# Patient Record
Sex: Male | Born: 1950
Health system: Southern US, Community
[De-identification: ages and names within clinical notes are randomized; demographics above are authoritative.]

## PROBLEM LIST (undated history)

## (undated) DIAGNOSIS — G8929 Other chronic pain: Secondary | ICD-10-CM

## (undated) DIAGNOSIS — R51 Headache: Secondary | ICD-10-CM

## (undated) DIAGNOSIS — T7840XA Allergy, unspecified, initial encounter: Secondary | ICD-10-CM

## (undated) DIAGNOSIS — M109 Gout, unspecified: Secondary | ICD-10-CM

## (undated) DIAGNOSIS — K219 Gastro-esophageal reflux disease without esophagitis: Secondary | ICD-10-CM

## (undated) DIAGNOSIS — I2510 Atherosclerotic heart disease of native coronary artery without angina pectoris: Secondary | ICD-10-CM

## (undated) DIAGNOSIS — E119 Type 2 diabetes mellitus without complications: Secondary | ICD-10-CM

## (undated) DIAGNOSIS — R519 Headache, unspecified: Secondary | ICD-10-CM

## (undated) DIAGNOSIS — T753XXA Motion sickness, initial encounter: Secondary | ICD-10-CM

## (undated) DIAGNOSIS — I10 Essential (primary) hypertension: Secondary | ICD-10-CM

## (undated) DIAGNOSIS — H269 Unspecified cataract: Secondary | ICD-10-CM

## (undated) DIAGNOSIS — N183 Chronic kidney disease, stage 3 unspecified: Secondary | ICD-10-CM

## (undated) DIAGNOSIS — Z9289 Personal history of other medical treatment: Secondary | ICD-10-CM

## (undated) DIAGNOSIS — E785 Hyperlipidemia, unspecified: Secondary | ICD-10-CM

## (undated) DIAGNOSIS — K08109 Complete loss of teeth, unspecified cause, unspecified class: Secondary | ICD-10-CM

## (undated) DIAGNOSIS — R12 Heartburn: Secondary | ICD-10-CM

## (undated) HISTORY — DX: Atherosclerotic heart disease of native coronary artery without angina pectoris: I25.10

## (undated) HISTORY — DX: Other chronic pain: G89.29

## (undated) HISTORY — PX: TONSILLECTOMY: SUR1361

## (undated) HISTORY — DX: Allergy, unspecified, initial encounter: T78.40XA

## (undated) HISTORY — PX: EYE SURGERY: SHX253

## (undated) HISTORY — DX: Hyperlipidemia, unspecified: E78.5

## (undated) HISTORY — DX: Headache: R51

## (undated) HISTORY — DX: Heartburn: R12

## (undated) HISTORY — DX: Unspecified cataract: H26.9

## (undated) HISTORY — DX: Headache, unspecified: R51.9

## (undated) HISTORY — PX: KNEE SURGERY: SHX244

## (undated) HISTORY — PX: SPINE SURGERY: SHX786

## (undated) HISTORY — DX: Personal history of other medical treatment: Z92.89

## (undated) HISTORY — PX: SHOULDER SURGERY: SHX246

## (undated) HISTORY — DX: Gastro-esophageal reflux disease without esophagitis: K21.9

---
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
## 2009-03-06 ENCOUNTER — Ambulatory Visit: Payer: Self-pay | Admitting: Anesthesiology

## 2009-04-17 ENCOUNTER — Ambulatory Visit: Payer: Self-pay | Admitting: Anesthesiology

## 2011-10-21 ENCOUNTER — Emergency Department: Payer: Self-pay | Admitting: Emergency Medicine

## 2011-10-21 LAB — CBC
HCT: 47.4 % (ref 40.0–52.0)
HGB: 16.2 g/dL (ref 13.0–18.0)
MCH: 30.7 pg (ref 26.0–34.0)
MCHC: 34.2 g/dL (ref 32.0–36.0)
MCV: 90 fL (ref 80–100)
Platelet: 230 10*3/uL (ref 150–440)
RBC: 5.29 10*6/uL (ref 4.40–5.90)
RDW: 13.9 % (ref 11.5–14.5)
WBC: 6.8 10*3/uL (ref 3.8–10.6)

## 2011-10-21 LAB — BASIC METABOLIC PANEL
Anion Gap: 6 — ABNORMAL LOW (ref 7–16)
BUN: 22 mg/dL — ABNORMAL HIGH (ref 7–18)
Calcium, Total: 9.5 mg/dL (ref 8.5–10.1)
Chloride: 104 mmol/L (ref 98–107)
Co2: 25 mmol/L (ref 21–32)
Creatinine: 1.19 mg/dL (ref 0.60–1.30)
EGFR (African American): >60
EGFR (Non-African Amer.): >60
Glucose: 133 mg/dL — ABNORMAL HIGH (ref 65–99)
Osmolality: 275 (ref 275–301)
Potassium: 5 mmol/L (ref 3.5–5.1)
Sodium: 135 mmol/L — ABNORMAL LOW (ref 136–145)

## 2011-10-21 LAB — CK TOTAL AND CKMB (NOT AT ARMC)
CK, Total: 118 U/L (ref 35–232)
CK-MB: 1 ng/mL (ref 0.5–3.6)

## 2011-10-21 LAB — TROPONIN I: Troponin-I: <0.02 ng/mL

## 2011-10-25 ENCOUNTER — Ambulatory Visit: Payer: Self-pay | Admitting: Family Medicine

## 2012-07-05 ENCOUNTER — Encounter: Payer: Self-pay | Admitting: Specialist

## 2012-07-26 ENCOUNTER — Encounter: Payer: Self-pay | Admitting: Specialist

## 2014-05-09 DIAGNOSIS — M75122 Complete rotator cuff tear or rupture of left shoulder, not specified as traumatic: Secondary | ICD-10-CM | POA: Insufficient documentation

## 2014-05-09 DIAGNOSIS — M65819 Other synovitis and tenosynovitis, unspecified shoulder: Secondary | ICD-10-CM | POA: Insufficient documentation

## 2014-05-09 DIAGNOSIS — M65811 Other synovitis and tenosynovitis, right shoulder: Secondary | ICD-10-CM | POA: Insufficient documentation

## 2014-05-22 ENCOUNTER — Ambulatory Visit: Payer: Self-pay | Admitting: Surgery

## 2014-05-26 ENCOUNTER — Ambulatory Visit: Payer: Self-pay | Admitting: Surgery

## 2014-05-27 ENCOUNTER — Ambulatory Visit: Payer: Self-pay | Admitting: Surgery

## 2014-05-29 DIAGNOSIS — S43439A Superior glenoid labrum lesion of unspecified shoulder, initial encounter: Secondary | ICD-10-CM | POA: Insufficient documentation

## 2014-05-29 DIAGNOSIS — S46909A Unspecified injury of unspecified muscle, fascia and tendon at shoulder and upper arm level, unspecified arm, initial encounter: Secondary | ICD-10-CM | POA: Insufficient documentation

## 2014-07-27 NOTE — Op Note (Signed)
PATIENT NAME:  [name], [name] MR#:  [id number] DATE OF BIRTH:  06-28-1950  DATE OF PROCEDURE:  05/27/2014  PREOPERATIVE DIAGNOSIS: Massive rotator cuff tear, right shoulder.  POSTOPERATIVE DIAGNOSES:  Massive rotator cuff tear with SLAP tear, biceps tendinopathy, and early degenerative joint disease, right shoulder.   PROCEDURES: Arthroscopic SLAP repair, arthroscopic debridement, arthroscopic subacromial decompression, mini-open repair of massive rotator cuff tear, and mini-open biceps tenodesis, right shoulder.   SURGEON:  Maryagnes AmosJ. Jeffrey Poggi, M.D.   ASSISTANT: Devota PaceApril Berndt, NP.   ANESTHESIA: General endotracheal with an interscalene block placed preoperative by the anesthesiologist.   FINDINGS: As noted above. There were grade 2 chondromalacia changes involving the glenoid. The superior labrum was torn from approximately the 10:30 position to the 12:30 position.  There were extensive tendinopathic changes of the biceps tendon. The superior 60%-70% of the subscapularis tendon had torn. In addition, there were complete tears of the supraspinatus and infraspinatus tendons.   COMPLICATIONS: None.   ESTIMATED BLOOD LOSS: < 25 mL.  TOTAL FLUIDS: 1000 mL of crystalloid.  TOURNIQUET: None.   DRAINS: None.   CLOSURE: Staples.   BRIEF CLINICAL NOTE: The patient is a 64 year old male who sustained an injury to his right shoulder when he was trying to pull on a rope and it gave way, causing him to fall backwards onto his shoulder approximately 2 months ago. His symptoms have persisted despite medications, activity modification, et [name]. His history and examination were consistent with a large rotator cuff tear, confirmed by MRI scan. He presents at this time for arthroscopy, debridement, decompression, and repair of the rotator cuff tear.   DESCRIPTION OF PROCEDURE: The patient was brought into the operating room and lain in the supine position. After adequate IV sedation was achieved, an  interscalene block was placed by the anesthesiologist. He then underwent general endotracheal intubation and anesthesia before being repositioned in the beach chair position using the beach chair positioner. The right shoulder and upper extremity were prepped with ChloraPrep solution before being draped sterilely. Preoperative antibiotics were administered. The expected portal sites and incision site were injected with 1% lidocaine with epinephrine before the camera was placed in the posterior portal. The glenohumeral joint was thoroughly inspected with the findings as described above. An anterior portal was created using an outside in technique. The labrum was debrided back to stable margins anteriorly and superiorly. In addition, the torn margins of the rotator cuff also were debrided back to stable margins using the full radius resector. Finally, the areas of synovitis and grade II chondromalacia were debrided back to stable margins. The probe was used to define the tear. The biceps tendon was released from its labral attachment using the ArthroCare wand. The exposed glenoid rim superiorly was roughened with an end-cutting rasp and full radius resector to expose good bleeding bone. The superior labrum was then secured using a single BioKnotless anchor placed at approximately the 12 o'clock position through a separate superolateral portal which was created using an outside in technique. Subsequent probing of the repair demonstrated good stability. The instruments were removed from the joint after suctioning the excess fluid.   The camera was repositioned through the posterior portal into the subacromial space. A separate lateral portal was created using an outside in technique. The full radius resector was used to debride the bursal tissues to improve visualization. The ArthroCare wand was used to remove the periosteal tissue off the undersurface of the anterior third of the acromion as well as to recess the  coracoacromial ligament from its attachment along the anterior and lateral margins of the acromion. The 4 mm acromionizing bur was used to complete the decompression by removing the undersurface of the anterior third of the acromion. The full radius resector was reintroduced to remove any residual bony debris before the ArthroCare wand was used to obtain hemostasis. The instruments were then removed from the subacromial space after suctioning the excess fluid.   An approximately 6-7 cm incision was made over the anterolateral aspect of the shoulder, incorporating the superolateral portal site. The incision was carried down through the subcutaneous tissues to expose the deltoid fascia. The raphe between the anterior and middle thirds was identified and this plane developed to provide access into the subacromial space. Additional bursal tissues were debrided sharply using Metzenbaum scissors to improve visualization. The torn portion of the supraspinatus and infraspinatus tendons was identified. It was a large L-shaped tear with the longitudinal arm anterior through the rotator interval. It measured approximately 3 cm longitudinally by 4 cm laterally. The frayed margins were debrided sharply with a #15 blade and the exposed greater tuberosity roughened with a rongeur.   Once this area was prepared, attention was directed to the subscapularis tendon. A short vertical incision was made through the bursal tissues over the anterior aspect of the subscapularis tendon at the margin of the tear. The #15 blade was used to debride the subscapularis tendon margin before a rongeur was used to debride the exposed lesser tuberosity. Two of the Biomet 2.9 mm JuggerKnot anchors were placed in the lesser tuberosity. Both sets of the more inferior suture were passed through the subscapularis tendon while one of the two sets of sutures from the more superior anchor also were passed through the tendon. These sutures were tied  securely then brought out laterally through bone tunnels into the bicipital groove to further reinforce the repair by effectively creating a two layer closure. The other suture of the more proximal anchor was reserved to be used to tie down the anterior portion of the supraspinatus later.   Once the subscapularis tendon was repaired, the biceps tendon was tenodesed into the inferior portion of the bicipital groove using a third Biomet 2.9 mm JuggerKnot anchor. Both sets of sutures were passed through the tendon. The tendon was tied securely to effect the tenodesis. The excess tendon was removed using a #15 blade.   Attention was redirected to the supraspinatus and infraspinatus tendons. Three of the Biomet 2.9 mm JuggerKnot anchors were placed along the articular margin. Each of the 6 sets of sutures were passed through the torn margin of the tendon and then tied securely to repair tendons. Several of these sutures were then brought out laterally through bone tunnels to further reinforce the repair by creating a two layer closure. The free set of sutures from the more proximal of the two anchors already placed in the subscapularis tendon was passed through the anterior margin of the supraspinatus tendon and tied securely. This also was brought back through the soft tissue laterally to further reinforce this repair. Several #0 Ethibond sutures were placed in a side-to-side fashion to close the rotator interval. An apparent watertight closure was obtained. The repair did not appear to be under any undue tension.   The wound was copiously irrigated with sterile saline solution before the deltoid fascia was reapproximated using 2-0 Vicryl interrupted sutures. The subcutaneous tissues were closed in two layers using 2-0 Vicryl interrupted sutures before the skin was closed using staples.  The portal sites also were closed using staples before a sterile bulky dressing was applied to the shoulder. The arm was placed  into a shoulder immobilizer before the patient was awakened, extubated, and returned to the recovery room in satisfactory condition after tolerating the procedure well.    ____________________________ J. Derald Macleod, MD jjp:mc D: 05/27/2014 11:15:49 ET T: 05/27/2014 11:33:29 ET JOB#: [id number]  cc: Maryagnes Amos, MD, <Dictator> Maryagnes Amos MD ELECTRONICALLY SIGNED 05/27/2014 14:16

## 2014-10-16 ENCOUNTER — Ambulatory Visit: Payer: Self-pay | Admitting: Family Medicine

## 2014-10-16 ENCOUNTER — Ambulatory Visit (INDEPENDENT_AMBULATORY_CARE_PROVIDER_SITE_OTHER): Payer: Commercial Indemnity | Admitting: Family Medicine

## 2014-10-16 ENCOUNTER — Encounter: Payer: Self-pay | Admitting: Family Medicine

## 2014-10-16 VITALS — BP 120/69 | HR 76 | Resp 16 | Ht 70.0 in | Wt 262.0 lb

## 2014-10-16 DIAGNOSIS — E785 Hyperlipidemia, unspecified: Secondary | ICD-10-CM | POA: Diagnosis not present

## 2014-10-16 DIAGNOSIS — I129 Hypertensive chronic kidney disease with stage 1 through stage 4 chronic kidney disease, or unspecified chronic kidney disease: Secondary | ICD-10-CM | POA: Insufficient documentation

## 2014-10-16 DIAGNOSIS — N183 Chronic kidney disease, stage 3 unspecified: Secondary | ICD-10-CM | POA: Insufficient documentation

## 2014-10-16 DIAGNOSIS — E1121 Type 2 diabetes mellitus with diabetic nephropathy: Secondary | ICD-10-CM | POA: Insufficient documentation

## 2014-10-16 DIAGNOSIS — M109 Gout, unspecified: Secondary | ICD-10-CM | POA: Insufficient documentation

## 2014-10-16 DIAGNOSIS — I10 Essential (primary) hypertension: Secondary | ICD-10-CM | POA: Insufficient documentation

## 2014-10-16 DIAGNOSIS — M1000 Idiopathic gout, unspecified site: Secondary | ICD-10-CM

## 2014-10-16 DIAGNOSIS — K219 Gastro-esophageal reflux disease without esophagitis: Secondary | ICD-10-CM | POA: Diagnosis not present

## 2014-10-16 DIAGNOSIS — E119 Type 2 diabetes mellitus without complications: Secondary | ICD-10-CM

## 2014-10-16 DIAGNOSIS — E1165 Type 2 diabetes mellitus with hyperglycemia: Secondary | ICD-10-CM

## 2014-10-16 DIAGNOSIS — Z794 Long term (current) use of insulin: Secondary | ICD-10-CM

## 2014-10-16 DIAGNOSIS — E1169 Type 2 diabetes mellitus with other specified complication: Secondary | ICD-10-CM | POA: Insufficient documentation

## 2014-10-16 LAB — POCT GLYCOSYLATED HEMOGLOBIN (HGB A1C): Hemoglobin A1C: 8.2

## 2014-10-16 MED ORDER — GLIMEPIRIDE 4 MG PO TABS: 4.0000 mg | ORAL_TABLET | Freq: Every day | ORAL | Status: DC

## 2014-10-16 MED ORDER — CANAGLIFLOZIN 300 MG PO TABS: 300.0000 mg | ORAL_TABLET | Freq: Every day | ORAL | Status: DC

## 2014-10-16 MED ORDER — ROSUVASTATIN CALCIUM 10 MG PO TABS: 10.0000 mg | ORAL_TABLET | Freq: Every day | ORAL | Status: DC

## 2014-10-16 NOTE — Progress Notes (Signed)
Name: [name]   MRN: [id number]    DOB: 30-Apr-1950   Date:10/16/2014       Progress Note  Subjective  Chief Complaint  Chief Complaint  Patient presents with  . Hypertension  . Diabetes    not checking BS    HPI  Here for f/u of DM, HBP, elevated chol.  Not checking BSs at home.  Reports muscle cramps with Simvastatin. Weight unchanged.  S/p shoulder surgery 4 mos. Ago.   Past Medical History  Diagnosis Date  . Diabetes mellitus without complication   . Heart burn   . Hyperlipidemia   . Allergy   . GERD (gastroesophageal reflux disease)   . Chronic headaches     Past Surgical History  Procedure Laterality Date  . Knee surgery    . Tonsillectomy      Family History  Problem Relation Age of Onset  . Cancer Father     lung  . Heart disease Sister   . Heart disease Brother     History   Social History  . Marital Status: Married    Spouse Name: N/A  . Number of Children: N/A  . Years of Education: N/A   Occupational History  . Not on file.   Social History Main Topics  . Smoking status: Current Every Day Smoker -- 1.00 packs/day for 39 years  . Smokeless tobacco: Current User    Types: Chew  . Alcohol Use: Yes  . Drug Use: No  . Sexual Activity: Yes    Birth Control/ Protection: Inserts   Other Topics Concern  . Not on file   Social History Narrative  . No narrative on file     Current outpatient prescriptions:  .  amLODipine (NORVASC) 2.5 MG tablet, , Disp: , Rfl:  .  colchicine 0.6 MG tablet, Take 0.6 mg by mouth daily., Disp: , Rfl:  .  fluticasone (FLONASE) 50 MCG/ACT nasal spray, Place into both nostrils daily., Disp: , Rfl:  .  glimepiride (AMARYL) 4 MG tablet, , Disp: , Rfl:  .  linagliptin (TRADJENTA) 5 MG TABS tablet, Take by mouth., Disp: , Rfl:  .  lisinopril (PRINIVIL,ZESTRIL) 40 MG tablet, , Disp: , Rfl:  .  metformin (FORTAMET) 1000 MG (OSM) 24 hr tablet, , Disp: , Rfl:  .  omeprazole (PRILOSEC) 20 MG capsule, , Disp: ,  Rfl:   Allergies  Allergen Reactions  . Prednisone Other (See Comments)     Review of Systems  Constitutional: Negative.  Negative for fever, chills, weight loss and malaise/fatigue.  HENT: Negative.  Negative for nosebleeds.   Eyes: Negative for blurred vision and double vision.  Respiratory: Negative for cough, sputum production, shortness of breath and wheezing.   Cardiovascular: Negative.  Negative for chest pain, palpitations, orthopnea and leg swelling.  Gastrointestinal: Negative for heartburn, abdominal pain and blood in stool.  Genitourinary: Negative for dysuria, urgency and frequency.  Skin: Negative.  Negative for rash.  Neurological: Negative for dizziness, tingling, sensory change, focal weakness, weakness and headaches.      Objective  Filed Vitals:   10/16/14 0852  BP: 120/69  Pulse: 76  Resp: 16  Height: 5\' 10"  (1.778 m)  Weight: 262 lb (118.842 kg)    Physical Exam  Constitutional: He is well-developed, well-nourished, and in no distress. No distress.  HENT:  Head: Normocephalic and atraumatic.  Eyes: Conjunctivae and EOM are normal. Pupils are equal, round, and reactive to light. No scleral icterus.  Neck: Normal  range of motion. Neck supple. No thyromegaly present.  Cardiovascular: Normal rate, regular rhythm, normal heart sounds and intact distal pulses.  Exam reveals no gallop and no friction rub.   No murmur heard. Pulmonary/Chest: Effort normal and breath sounds normal. No respiratory distress. He has no wheezes. He has no rales.  Abdominal: Soft. Bowel sounds are normal. He exhibits no distension and no mass. There is no tenderness.  Musculoskeletal: He exhibits no edema.  Lymphadenopathy:    He has no cervical adenopathy.  Vitals reviewed.       Assessment & Plan  Problem List Items Addressed This Visit      Cardiovascular and Mediastinum   Hypertension   Relevant Medications   amLODipine (NORVASC) 2.5 MG tablet   lisinopril  (PRINIVIL,ZESTRIL) 40 MG tablet     Digestive   GERD without esophagitis   Relevant Medications   omeprazole (PRILOSEC) 20 MG capsule     Endocrine   Diabetes - Primary   Relevant Medications   glimepiride (AMARYL) 4 MG tablet   lisinopril (PRINIVIL,ZESTRIL) 40 MG tablet   metformin (FORTAMET) 1000 MG (OSM) 24 hr tablet   linagliptin (TRADJENTA) 5 MG TABS tablet   Other Relevant Orders   POCT HgB A1C     Other   Dyslipidemia   Gout      Meds ordered this encounter  Medications  . DISCONTD: allopurinol (ZYLOPRIM) 100 MG tablet    Sig:   . amLODipine (NORVASC) 2.5 MG tablet    Sig:   . glimepiride (AMARYL) 4 MG tablet    Sig:   . DISCONTD: TRADJENTA 5 MG TABS tablet    Sig:   . lisinopril (PRINIVIL,ZESTRIL) 40 MG tablet    Sig:   . metformin (FORTAMET) 1000 MG (OSM) 24 hr tablet    Sig:   . omeprazole (PRILOSEC) 20 MG capsule    Sig:   . DISCONTD: oxyCODONE (OXY IR/ROXICODONE) 5 MG immediate release tablet    Sig: TK 1 T PO QD PRN P    Refill:  0  . DISCONTD: simvastatin (ZOCOR) 20 MG tablet    Sig: Take 20 mg by mouth daily.  . colchicine 0.6 MG tablet    Sig: Take 0.6 mg by mouth daily.  Marland Kitchen DISCONTD: cyclobenzaprine (FLEXERIL) 10 MG tablet    Sig: Take 10 mg by mouth 3 (three) times daily as needed for muscle spasms.  . fluticasone (FLONASE) 50 MCG/ACT nasal spray    Sig: Place into both nostrils daily.  Marland Kitchen linagliptin (TRADJENTA) 5 MG TABS tablet    Sig: Take by mouth.   1. Type 2 diabetes mellitus without complication  - POCT HgB A1C-8.2 - canagliflozin (INVOKANA) 300 MG TABS tablet; Take 300 mg by mouth daily before breakfast.  Dispense: 30 tablet; Refill: 6 - glimepiride (AMARYL) 4 MG tablet; Take 1 tablet (4 mg total) by mouth daily with breakfast.  Dispense: 30 tablet; Refill: 12  2. Essential hypertension   3. Dyslipidemia  - rosuvastatin (CRESTOR) 10 MG tablet; Take 1 tablet (10 mg total) by mouth daily.  Dispense: 90 tablet; Refill: 3  4.  Idiopathic gout, unspecified chronicity, unspecified site   5. GERD without esophagitis

## 2014-10-16 NOTE — Patient Instructions (Addendum)
Check sugars daily.  Bring log next visit.

## 2014-10-21 ENCOUNTER — Other Ambulatory Visit: Payer: Self-pay

## 2014-10-21 ENCOUNTER — Telehealth: Payer: Self-pay

## 2014-10-21 ENCOUNTER — Encounter: Payer: Self-pay | Admitting: Student

## 2014-10-21 ENCOUNTER — Emergency Department
Admission: EM | Admit: 2014-10-21 | Discharge: 2014-10-21 | Disposition: A | Discharge status: Home / Self Care | Payer: Commercial Indemnity | Attending: Emergency Medicine | Admitting: Emergency Medicine

## 2014-10-21 DIAGNOSIS — E119 Type 2 diabetes mellitus without complications: Secondary | ICD-10-CM | POA: Insufficient documentation

## 2014-10-21 DIAGNOSIS — R197 Diarrhea, unspecified: Secondary | ICD-10-CM | POA: Insufficient documentation

## 2014-10-21 DIAGNOSIS — Z87891 Personal history of nicotine dependence: Secondary | ICD-10-CM | POA: Diagnosis not present

## 2014-10-21 DIAGNOSIS — I10 Essential (primary) hypertension: Secondary | ICD-10-CM | POA: Diagnosis not present

## 2014-10-21 DIAGNOSIS — N289 Disorder of kidney and ureter, unspecified: Secondary | ICD-10-CM | POA: Diagnosis not present

## 2014-10-21 DIAGNOSIS — R531 Weakness: Secondary | ICD-10-CM | POA: Diagnosis present

## 2014-10-21 HISTORY — DX: Essential (primary) hypertension: I10

## 2014-10-21 LAB — BASIC METABOLIC PANEL
Anion gap: 12 (ref 5–15)
Anion gap: 9 (ref 5–15)
BUN: 26 mg/dL — ABNORMAL HIGH (ref 6–20)
BUN: 26 mg/dL — ABNORMAL HIGH (ref 6–20)
CO2: 22 mmol/L (ref 22–32)
CO2: 22 mmol/L (ref 22–32)
Calcium: 9.9 mg/dL (ref 8.9–10.3)
Calcium: 8.9 mg/dL (ref 8.9–10.3)
Chloride: 98 mmol/L — ABNORMAL LOW (ref 101–111)
Chloride: 102 mmol/L (ref 101–111)
Creatinine, Ser: 2.02 mg/dL — ABNORMAL HIGH (ref 0.61–1.24)
Creatinine, Ser: 1.89 mg/dL — ABNORMAL HIGH (ref 0.61–1.24)
GFR calc Af Amer: 39 mL/min — ABNORMAL LOW (ref >60)
GFR calc Af Amer: 42 mL/min — ABNORMAL LOW (ref >60)
GFR calc non Af Amer: 33 mL/min — ABNORMAL LOW (ref >60)
GFR calc non Af Amer: 36 mL/min — ABNORMAL LOW (ref >60)
Glucose, Bld: 180 mg/dL — ABNORMAL HIGH (ref 65–99)
Glucose, Bld: 122 mg/dL — ABNORMAL HIGH (ref 65–99)
Potassium: 5 mmol/L (ref 3.5–5.1)
Potassium: 4.8 mmol/L (ref 3.5–5.1)
Sodium: 132 mmol/L — ABNORMAL LOW (ref 135–145)
Sodium: 133 mmol/L — ABNORMAL LOW (ref 135–145)

## 2014-10-21 LAB — URINALYSIS COMPLETE WITH MICROSCOPIC (ARMC ONLY)
Bacteria, UA: NONE SEEN
Bilirubin Urine: NEGATIVE
Glucose, UA: >500 mg/dL — AB
Hgb urine dipstick: NEGATIVE
Ketones, ur: NEGATIVE mg/dL
Leukocytes, UA: NEGATIVE
Nitrite: NEGATIVE
Protein, ur: NEGATIVE mg/dL
Specific Gravity, Urine: 1.023 (ref 1.005–1.030)
pH: 5 (ref 5.0–8.0)

## 2014-10-21 LAB — CBC
HCT: 45.6 % (ref 40.0–52.0)
Hemoglobin: 14.6 g/dL (ref 13.0–18.0)
MCH: 28.3 pg (ref 26.0–34.0)
MCHC: 32.1 g/dL (ref 32.0–36.0)
MCV: 88.3 fL (ref 80.0–100.0)
Platelets: 338 10*3/uL (ref 150–440)
RBC: 5.16 MIL/uL (ref 4.40–5.90)
RDW: 14.5 % (ref 11.5–14.5)
WBC: 11.4 10*3/uL — ABNORMAL HIGH (ref 3.8–10.6)

## 2014-10-21 MED ORDER — SODIUM CHLORIDE 0.9 % IV SOLN
Freq: Once | INTRAVENOUS | Status: AC
  Administered 2014-10-21: 18:00:00 via INTRAVENOUS

## 2014-10-21 NOTE — Telephone Encounter (Signed)
Called and spoke with patient about his symptoms. He stopped crestor yesterday. Advised him this is okay. He reports feeling very nauseated and sugar is higher than baseline of 180.  Advised him that his new medication Invokana can cause too much acid in the blood and this could be a life threatening situation. Encouraged pt is symptoms of nausea, weakness, abdominal pain, increased thirst, fatigue, or malaise continued through the evening or worsened and sugars continued to rise, he needed to be seen at ER to rule out this side effect.

## 2014-10-21 NOTE — ED Provider Notes (Signed)
[facility] Emergency Department Provider Note     Time seen: ----------------------------------------- 5:00 PM on 10/21/2014 -----------------------------------------    I have reviewed the triage vital signs and the nursing notes.   HISTORY  Chief Complaint Weakness; Diarrhea; and Medication Reaction    HPI [name] is a 64 y.o. male who presents ER with weakness and fatigue, mild diarrhea and nausea. Patient recently started a new diabetes medication as well as a new cholesterol medication about a week ago. Reports elevated blood sugars and 200s, he states the fatigue was so significant he had to stop the medications. Denies fevers chills or other complaints.   Past Medical History  Diagnosis Date  . Diabetes mellitus without complication   . Heart burn   . Hyperlipidemia   . Allergy   . GERD (gastroesophageal reflux disease)   . Chronic headaches   . Hypertension     Patient Active Problem List   Diagnosis Date Noted  . Diabetes 10/16/2014  . Hypertension 10/16/2014  . Dyslipidemia 10/16/2014  . Gout 10/16/2014  . GERD without esophagitis 10/16/2014  . Injury of tendon of upper extremity 05/29/2014  . Glenoid labral tear 05/29/2014  . Complete rotator cuff rupture of left shoulder 05/09/2014  . Infraspinatus tenosynovitis 05/09/2014    Past Surgical History  Procedure Laterality Date  . Knee surgery    . Tonsillectomy    . Shoulder surgery      Allergies Prednisone  Social History History  Substance Use Topics  . Smoking status: Former Smoker -- 1.00 packs/day for 39 years  . Smokeless tobacco: Current User    Types: Chew  . Alcohol Use: Yes    Review of Systems Constitutional: Negative for fever. Eyes: Negative for visual changes. ENT: Negative for sore throat. Cardiovascular: Negative for chest pain. Respiratory: Negative for shortness of breath. Gastrointestinal: Negative for abdominal pain, positive for  mild diarrhea Genitourinary: Negative for dysuria. Musculoskeletal: Negative for back pain. Skin: Negative for rash. Neurological: Positive for weakness  10-point ROS otherwise negative.  ____________________________________________   PHYSICAL EXAM:  VITAL SIGNS: ED Triage Vitals  Enc Vitals Group     BP 10/21/14 1638 122/58 mmHg     Pulse Rate 10/21/14 1638 101     Resp 10/21/14 1638 18     Temp 10/21/14 1638 99.2 F (37.3 C)     Temp Source 10/21/14 1638 Oral     SpO2 10/21/14 1638 96 %     Weight 10/21/14 1638 261 lb (118.389 kg)     Height 10/21/14 1638  (1.803 m)     Head Cir --      Peak Flow --      Pain Score --      Pain Loc --      Pain Edu? --      Excl. in GC? --     Constitutional: Alert and oriented. Well appearing and in no distress. Eyes: Conjunctivae are normal. PERRL. Normal extraocular movements. ENT   Head: Normocephalic and atraumatic.   Nose: No congestion/rhinnorhea.   Mouth/Throat: Mucous membranes are moist.   Neck: No stridor. Cardiovascular: Normal rate, regular rhythm. Normal and symmetric distal pulses are present in all extremities. No murmurs, rubs, or gallops. Respiratory: Normal respiratory effort without tachypnea nor retractions. Breath sounds are clear and equal bilaterally. No wheezes/rales/rhonchi. Gastrointestinal: Soft and nontender. No distention. No abdominal bruits. Musculoskeletal: Nontender with normal range of motion in all extremities. No joint effusions.  No lower extremity  tenderness nor edema. Neurologic:  Normal speech and language. No gross focal neurologic deficits are appreciated. Speech is normal. No gait instability. Skin:  Skin is warm, dry and intact. No rash noted. Psychiatric: Mood and affect are normal. Speech and behavior are normal. Patient exhibits appropriate insight and judgment. ____________________________________________  EKG: Interpreted by me. Normal sinus rhythm with left axis  deviation, PR interval was normal, QRS with normal, QT intervals normal. Questionable anterior infarct. Rate is 97 bpm  ____________________________________________  ED COURSE:  Pertinent labs & imaging results that were available during my care of the patient were reviewed by me and considered in my medical decision making (see chart for details). We'll check basic labs and reevaluate. ____________________________________________    LABS (pertinent positives/negatives)  Labs Reviewed  BASIC METABOLIC PANEL - Abnormal; Notable for the following:    Sodium 132 (*)    Chloride 98 (*)    Glucose, Bld 180 (*)    BUN 26 (*)    Creatinine, Ser 2.02 (*)    GFR calc non Af Amer 33 (*)    GFR calc Af Amer 39 (*)    All other components within normal limits  CBC - Abnormal; Notable for the following:    WBC 11.4 (*)    All other components within normal limits  URINALYSIS COMPLETEWITH MICROSCOPIC ([facility] ONLY) - Abnormal; Notable for the following:    Color, Urine YELLOW (*)    APPearance CLEAR (*)    Glucose, UA >500 (*)    Squamous Epithelial / LPF 0-5 (*)    All other components within normal limits  BASIC METABOLIC PANEL - Abnormal; Notable for the following:    Sodium 133 (*)    Glucose, Bld 122 (*)    BUN 26 (*)    Creatinine, Ser 1.89 (*)    GFR calc non Af Amer 36 (*)    GFR calc Af Amer 42 (*)    All other components within normal limits    ____________________________________________  FINAL ASSESSMENT AND PLAN  Weakness, acute renal insufficiency  Plan: Patient with labs and imaging as dictated above. A lot of his symptoms are probably medication related. He was noted to have acute renal sufficiency with a creatinine over 2.0. We gave him a liter fluid rechecked his chemistries which showed some improvement in his renal function. He is advised to hold his medications as recently prescribed continue to increase by mouth intake and follow-up with his primary care  doctor.   Emily Filbert, MD   Emily Filbert, MD 10/21/14 [phone]

## 2014-10-21 NOTE — Discharge Instructions (Signed)
Kidney Failure Kidney failure happens when the kidneys cannot remove waste and excess fluid that naturally builds up in your blood after your body breaks down food. This leads to a dangerous buildup of waste products and fluid in the blood. HOME CARE  Follow your diet as told by your doctor.  Take all medicines as told by your doctor.  Keep all of your dialysis appointments. Call if you are unable to keep an appointment. GET HELP RIGHT AWAY IF:   You make a lot more or very little pee (urine).  Your face or ankles puff up (swell).  You develop shortness of breath.  You develop weakness, feel tired, or you do not feel hungry (appetite loss).  You feel poorly for no known reason. MAKE SURE YOU:   Understand these instructions.  Will watch your condition.  Will get help right away if you are not doing well or get worse. Document Released: 06/08/2009 Document Revised: 06/06/2011 Document Reviewed: 07/15/2009 [facility] Patient Information 2015 [place], [place]. This information is not intended to replace advice given to you by your health care provider. Make sure you discuss any questions you have with your health care provider. Weakness Weakness is a lack of strength. It may be felt all over the body (generalized) or in one specific part of the body (focal). Some causes of weakness can be serious. You may need further medical evaluation, especially if you are elderly or you have a history of immunosuppression (such as chemotherapy or HIV), kidney disease, heart disease, or diabetes. CAUSES  Weakness can be caused by many different things, including:  Infection.  Physical exhaustion.  Internal bleeding or other blood loss that results in a lack of red blood cells (anemia).  Dehydration. This cause is more common in elderly people.  Side effects or electrolyte abnormalities from medicines, such as pain medicines or sedatives.  Emotional distress, anxiety, or  depression.  Circulation problems, especially severe peripheral arterial disease.  Heart disease, such as rapid atrial fibrillation, bradycardia, or heart failure.  Nervous system disorders, such as Guillain-Barr syndrome, multiple sclerosis, or stroke. DIAGNOSIS  To find the cause of your weakness, your caregiver will take your history and perform a physical exam. Lab tests or X-rays may also be ordered, if needed. TREATMENT  Treatment of weakness depends on the cause of your symptoms and can vary greatly. HOME CARE INSTRUCTIONS   Rest as needed.  Eat a well-balanced diet.  Try to get some exercise every day.  Only take over-the-counter or prescription medicines as directed by your caregiver. SEEK MEDICAL CARE IF:   Your weakness seems to be getting worse or spreads to other parts of your body.  You develop new aches or pains. SEEK IMMEDIATE MEDICAL CARE IF:   You cannot perform your normal daily activities, such as getting dressed and feeding yourself.  You cannot walk up and down stairs, or you feel exhausted when you do so.  You have shortness of breath or chest pain.  You have difficulty moving parts of your body.  You have weakness in only one area of the body or on only one side of the body.  You have a fever.  You have trouble speaking or swallowing.  You cannot control your bladder or bowel movements.  You have black or bloody vomit or stools. MAKE SURE YOU:  Understand these instructions.  Will watch your condition.  Will get help right away if you are not doing well or get worse. Document Released: 03/14/2005  Document Revised: 09/13/2011 Document Reviewed: 05/13/2011 [facility] Patient Information 2015 [place], [place]. This information is not intended to replace advice given to you by your health care provider. Make sure you discuss any questions you have with your health care provider.

## 2014-10-21 NOTE — Telephone Encounter (Signed)
Has had to stop cholesterol med. At last OV A1C was not at goal and did not go down at all. 8.2. Patient having severe nausea and weakness. He has never felt this way until mixing new diabetes med he also started and the cholesterol together. He checked sugar 210-230 today. Is it going to hurt him to stop the cholesterol until Dr. Juanetta Gosling returns? He will continue to check Blood Sugar and report any new symptoms. Please let me know if he needs to do something different to control all this before PCP returns. [facility]

## 2014-10-21 NOTE — ED Notes (Signed)
Pt reports having weakness, mild diarrhea, and nausea since starting Invokana and a new cholesterol medication about a week ago. Pt reports elevated blood sugar readings when he checked it about two hours ago.

## 2014-11-18 ENCOUNTER — Ambulatory Visit (INDEPENDENT_AMBULATORY_CARE_PROVIDER_SITE_OTHER): Payer: Commercial Indemnity | Admitting: Family Medicine

## 2014-11-18 ENCOUNTER — Encounter: Payer: Self-pay | Admitting: Family Medicine

## 2014-11-18 VITALS — BP 134/82 | HR 76 | Resp 16 | Ht 70.0 in | Wt 264.6 lb

## 2014-11-18 DIAGNOSIS — N289 Disorder of kidney and ureter, unspecified: Secondary | ICD-10-CM | POA: Diagnosis not present

## 2014-11-18 DIAGNOSIS — I10 Essential (primary) hypertension: Secondary | ICD-10-CM | POA: Diagnosis not present

## 2014-11-18 DIAGNOSIS — E119 Type 2 diabetes mellitus without complications: Secondary | ICD-10-CM

## 2014-11-18 DIAGNOSIS — M1000 Idiopathic gout, unspecified site: Secondary | ICD-10-CM | POA: Diagnosis not present

## 2014-11-18 MED ORDER — GLIMEPIRIDE 4 MG PO TABS: ORAL_TABLET | ORAL | Status: DC

## 2014-11-18 NOTE — Progress Notes (Signed)
Name: [name]   MRN: [id number]    DOB: 1950/11/19   Date:11/18/2014       Progress Note  Subjective  Chief Complaint  Chief Complaint  Patient presents with  . Diabetes    A1C 10/16/2014   8.2     BS 90-120    HPI  For f/u of ER visit and DM.  Started Invokana nd Crestor last month, but felt weak and in ER found to have acute renal failure.  Stopped both meds.  Renal insufficiency improved with fluid in ER.  Feeling well now.  BSs at home range 75-200 with most in 130-160 range.   Past Medical History  Diagnosis Date  . Diabetes mellitus without complication   . Heart burn   . Hyperlipidemia   . Allergy   . GERD (gastroesophageal reflux disease)   . Chronic headaches   . Hypertension     Social History  Substance Use Topics  . Smoking status: Former Smoker -- 1.00 packs/day for 39 years  . Smokeless tobacco: Current User    Types: Chew  . Alcohol Use: Yes     Current outpatient prescriptions:  .  amLODipine (NORVASC) 2.5 MG tablet, , Disp: , Rfl:  .  colchicine 0.6 MG tablet, Take 0.6 mg by mouth daily., Disp: , Rfl:  .  fluticasone (FLONASE) 50 MCG/ACT nasal spray, Place into both nostrils daily., Disp: , Rfl:  .  glimepiride (AMARYL) 4 MG tablet, Take 2 tablets each morning with breakfast, Disp: 60 tablet, Rfl: 12 .  linagliptin (TRADJENTA) 5 MG TABS tablet, Take by mouth., Disp: , Rfl:  .  lisinopril (PRINIVIL,ZESTRIL) 40 MG tablet, , Disp: , Rfl:  .  metformin (FORTAMET) 1000 MG (OSM) 24 hr tablet, , Disp: , Rfl:  .  omeprazole (PRILOSEC) 20 MG capsule, , Disp: , Rfl:   Allergies  Allergen Reactions  . Prednisone Other (See Comments)    Review of Systems  Constitutional: Negative for fever and chills.  HENT: Negative for hearing loss.   Eyes: Negative for blurred vision and double vision.  Respiratory: Negative for cough, sputum production, shortness of breath and wheezing.   Cardiovascular: Negative for chest pain, palpitations, orthopnea and leg  swelling.  Gastrointestinal: Negative for heartburn, nausea, vomiting, abdominal pain, diarrhea and blood in stool.  Genitourinary: Negative for dysuria, urgency and frequency.  Musculoskeletal: Negative for myalgias and joint pain.  Skin: Negative for rash.  Neurological: Negative for dizziness, sensory change, focal weakness and headaches.      Objective  Filed Vitals:   11/18/14 0818  BP: 134/82  Pulse: 76  Resp: 16  Height: '5\' 10"'  (1.778 m)  Weight: 264 lb 9.6 oz (120.022 kg)     Physical Exam  Constitutional: He is oriented to person, place, and time and well-developed, well-nourished, and in no distress. No distress.  HENT:  Head: Normocephalic and atraumatic.  Eyes: Conjunctivae and EOM are normal. Pupils are equal, round, and reactive to light.  Neck: Normal range of motion. Neck supple. Carotid bruit is not present. No thyromegaly present.  Cardiovascular: Normal rate, regular rhythm, normal heart sounds and intact distal pulses.  Exam reveals no gallop and no friction rub.   No murmur heard. Pulmonary/Chest: Effort normal and breath sounds normal. No respiratory distress. He has no wheezes. He has no rales.  Abdominal: Soft. Bowel sounds are normal. He exhibits no distension and no mass. There is no tenderness.  Musculoskeletal: Normal range of motion. He exhibits no  edema.  Lymphadenopathy:    He has no cervical adenopathy.  Neurological: He is alert and oriented to person, place, and time.      Recent Results (from the past 2160 hour(s))  POCT HgB A1C     Status: Abnormal   Collection Time: 10/16/14  9:15 AM  Result Value Ref Range   Hemoglobin A1C 8.2   Basic metabolic panel     Status: Abnormal   Collection Time: 10/21/14  4:56 PM  Result Value Ref Range   Sodium 132 (L) 135 - 145 mmol/L   Potassium 5.0 3.5 - 5.1 mmol/L   Chloride 98 (L) 101 - 111 mmol/L   CO2 22 22 - 32 mmol/L   Glucose, Bld 180 (H) 65 - 99 mg/dL   BUN 26 (H) 6 - 20 mg/dL    Creatinine, Ser 2.02 (H) 0.61 - 1.24 mg/dL   Calcium 9.9 8.9 - 10.3 mg/dL   GFR calc non Af Amer 33 (L) >60 mL/min   GFR calc Af Amer 39 (L) >60 mL/min    Comment: (NOTE) The eGFR has been calculated using the CKD EPI equation. This calculation has not been validated in all clinical situations. eGFR's persistently <60 mL/min signify possible Chronic Kidney Disease.    Anion gap 12 5 - 15  CBC     Status: Abnormal   Collection Time: 10/21/14  4:56 PM  Result Value Ref Range   WBC 11.4 (H) 3.8 - 10.6 K/uL   RBC 5.16 4.40 - 5.90 MIL/uL   Hemoglobin 14.6 13.0 - 18.0 g/dL   HCT 45.6 40.0 - 52.0 %   MCV 88.3 80.0 - 100.0 fL   MCH 28.3 26.0 - 34.0 pg   MCHC 32.1 32.0 - 36.0 g/dL   RDW 14.5 11.5 - 14.5 %   Platelets 338 150 - 440 K/uL  Urinalysis complete, with microscopic ([facility] only)     Status: Abnormal   Collection Time: 10/21/14  4:56 PM  Result Value Ref Range   Color, Urine YELLOW (A) YELLOW   APPearance CLEAR (A) CLEAR   Glucose, UA >500 (A) NEGATIVE mg/dL   Bilirubin Urine NEGATIVE NEGATIVE   Ketones, ur NEGATIVE NEGATIVE mg/dL   Specific Gravity, Urine 1.023 1.005 - 1.030   Hgb urine dipstick NEGATIVE NEGATIVE   pH 5.0 5.0 - 8.0   Protein, ur NEGATIVE NEGATIVE mg/dL   Nitrite NEGATIVE NEGATIVE   Leukocytes, UA NEGATIVE NEGATIVE   RBC / HPF 0-5 0 - 5 RBC/hpf   WBC, UA 0-5 0 - 5 WBC/hpf   Bacteria, UA NONE SEEN NONE SEEN   Squamous Epithelial / LPF 0-5 (A) NONE SEEN   Mucous PRESENT    Hyaline Casts, UA PRESENT   Basic metabolic panel     Status: Abnormal   Collection Time: 10/21/14  6:46 PM  Result Value Ref Range   Sodium 133 (L) 135 - 145 mmol/L   Potassium 4.8 3.5 - 5.1 mmol/L    Comment: HEMOLYSIS AT THIS LEVEL MAY AFFECT RESULT   Chloride 102 101 - 111 mmol/L   CO2 22 22 - 32 mmol/L   Glucose, Bld 122 (H) 65 - 99 mg/dL   BUN 26 (H) 6 - 20 mg/dL   Creatinine, Ser 1.89 (H) 0.61 - 1.24 mg/dL   Calcium 8.9 8.9 - 10.3 mg/dL   GFR calc non Af Amer 36 (L) >60  mL/min   GFR calc Af Amer 42 (L) >60 mL/min    Comment: (NOTE) The eGFR  has been calculated using the CKD EPI equation. This calculation has not been validated in all clinical situations. eGFR's persistently <60 mL/min signify possible Chronic Kidney Disease.    Anion gap 9 5 - 15     Assessment & Plan  1. Type 2 diabetes mellitus without complication  - glimepiride (AMARYL) 4 MG tablet; Take 2 tablets each morning with breakfast  Dispense: 60 tablet; Refill: 12  2. Essential hypertension   3. Idiopathic gout, unspecified chronicity, unspecified site   4. Acute renal insufficiency  - Basic Metabolic Panel (BMET)

## 2014-11-19 LAB — BASIC METABOLIC PANEL
BUN/Creatinine Ratio: 15 (ref 10–22)
BUN: 19 mg/dL (ref 8–27)
CO2: 20 mmol/L (ref 18–29)
Calcium: 9.9 mg/dL (ref 8.6–10.2)
Chloride: 101 mmol/L (ref 97–108)
Creatinine, Ser: 1.24 mg/dL (ref 0.76–1.27)
GFR calc Af Amer: 71 mL/min/{1.73_m2} (ref >59)
GFR calc non Af Amer: 61 mL/min/{1.73_m2} (ref >59)
Glucose: 91 mg/dL (ref 65–99)
Potassium: 4.5 mmol/L (ref 3.5–5.2)
Sodium: 139 mmol/L (ref 134–144)

## 2014-11-19 NOTE — Progress Notes (Signed)
Mailed

## 2015-01-12 ENCOUNTER — Ambulatory Visit (INDEPENDENT_AMBULATORY_CARE_PROVIDER_SITE_OTHER): Payer: Commercial Indemnity | Admitting: Family Medicine

## 2015-01-12 ENCOUNTER — Encounter: Payer: Self-pay | Admitting: Family Medicine

## 2015-01-12 VITALS — BP 151/85 | HR 68 | Temp 98.1°F | Resp 16 | Ht 70.0 in | Wt 267.8 lb

## 2015-01-12 DIAGNOSIS — Z720 Tobacco use: Secondary | ICD-10-CM

## 2015-01-12 DIAGNOSIS — E669 Obesity, unspecified: Secondary | ICD-10-CM

## 2015-01-12 DIAGNOSIS — I10 Essential (primary) hypertension: Secondary | ICD-10-CM | POA: Diagnosis not present

## 2015-01-12 NOTE — Progress Notes (Signed)
Date:  01/12/2015   Name:  Daniel SanesKenneth Eugene Berg   DOB:  10/02/1950   MRN:  [id number]  PCP:  [name], MD    Chief Complaint: Employment Physical   History of Present Illness:  This is a 64 y.o. male with HTN, DM, gout, HLD, GERD for completion of form for employer requesting exemption for tobacco use and BMI criteria for insurance rate decrease. He is trying to cut back on his tobacco use and he is working with a wellness nurse over the telephone to try to lose weight though he has been unsuccessful. Last seen by Dr. Juanetta GoslingHawkins 11/18/14.  Review of Systems:  Review of Systems  Patient Active Problem List   Diagnosis Date Noted  . Smokeless tobacco use 01/12/2015  . Obesity, Class II, BMI 35-39.9 01/12/2015  . Diabetes (HCC) 10/16/2014  . Hypertension 10/16/2014  . Dyslipidemia 10/16/2014  . Gout 10/16/2014  . GERD without esophagitis 10/16/2014  . Injury of tendon of upper extremity 05/29/2014  . Glenoid labral tear 05/29/2014  . Complete rotator cuff rupture of left shoulder 05/09/2014  . Infraspinatus tenosynovitis 05/09/2014  . Other synovitis and tenosynovitis, right shoulder 05/09/2014    Prior to Admission medications   Medication Sig Start Date End Date Taking? Authorizing Provider  allopurinol (ZYLOPRIM) 100 MG tablet  12/17/14  Yes Historical Provider, MD  amLODipine (NORVASC) 2.5 MG tablet  10/09/14  Yes Historical Provider, MD  colchicine 0.6 MG tablet Take 0.6 mg by mouth daily.   Yes Historical Provider, MD  fluticasone (FLONASE) 50 MCG/ACT nasal spray Place into both nostrils daily.   Yes Historical Provider, MD  glimepiride (AMARYL) 4 MG tablet Take 2 tablets each morning with breakfast 11/18/14  Yes Janeann ForehandJames H Hawkins Jr., MD  linagliptin (TRADJENTA) 5 MG TABS tablet Take by mouth.   Yes Historical Provider, MD  lisinopril (PRINIVIL,ZESTRIL) 40 MG tablet  10/09/14  Yes Historical Provider, MD  metformin (FORTAMET) 1000 MG (OSM) 24 hr tablet  08/22/14  Yes Historical  Provider, MD  omeprazole (PRILOSEC) 20 MG capsule  10/09/14  Yes Historical Provider, MD    Allergies  Allergen Reactions  . Prednisone Other (See Comments)    Past Surgical History  Procedure Laterality Date  . Knee surgery    . Tonsillectomy    . Shoulder surgery      Social History  Substance Use Topics  . Smoking status: Former Smoker -- 1.00 packs/day for 39 years  . Smokeless tobacco: Current User    Types: Chew  . Alcohol Use: No    Family History  Problem Relation Age of Onset  . Cancer Father     lung  . Heart disease Sister   . Heart disease Brother     Medication list has been reviewed and updated.  Physical Examination: BP 151/85 mmHg  Pulse 68  Temp(Src) 98.1 F (36.7 C) (Oral)  Resp 16  Ht 5\' 10"  (1.778 m)  Wt 267 lb 12.8 oz (121.473 kg)  BMI 38.43 kg/m2  Physical Exam  Constitutional: He appears well-developed and well-nourished.  Neurological: He is alert.  Psychiatric: He has a normal mood and affect. His behavior is normal.      Assessment and Plan:  1. Essential hypertension Marginal control, encouraged weight loss  2. Smokeless tobacco use Attempts to cut back documented on form  3. Obesity, Class II, BMI 35-39.9 Work with wellness nurse over telephone documented on form  Return in about 4 weeks (around 02/09/2015).  Chrissie NoaWilliam  Philmore Pali MD [facility]  01/12/2015

## 2015-01-15 ENCOUNTER — Ambulatory Visit: Payer: Commercial Indemnity | Admitting: Family Medicine

## 2015-01-23 ENCOUNTER — Encounter: Payer: Self-pay | Admitting: Family Medicine

## 2015-01-23 ENCOUNTER — Ambulatory Visit (INDEPENDENT_AMBULATORY_CARE_PROVIDER_SITE_OTHER): Payer: Commercial Indemnity | Admitting: Family Medicine

## 2015-01-23 VITALS — BP 135/70 | HR 74 | Temp 99.2°F | Resp 16 | Ht 70.0 in | Wt 269.0 lb

## 2015-01-23 DIAGNOSIS — I10 Essential (primary) hypertension: Secondary | ICD-10-CM | POA: Diagnosis not present

## 2015-01-23 DIAGNOSIS — E669 Obesity, unspecified: Secondary | ICD-10-CM

## 2015-01-23 DIAGNOSIS — E119 Type 2 diabetes mellitus without complications: Secondary | ICD-10-CM

## 2015-01-23 DIAGNOSIS — M67472 Ganglion, left ankle and foot: Secondary | ICD-10-CM

## 2015-01-23 LAB — POCT GLYCOSYLATED HEMOGLOBIN (HGB A1C): Hemoglobin A1C: 6.3

## 2015-01-23 MED ORDER — GLIMEPIRIDE 4 MG PO TABS: 4.0000 mg | ORAL_TABLET | Freq: Every day | ORAL | Status: DC

## 2015-01-23 NOTE — Progress Notes (Signed)
Name: [name]   MRN: [id number]    DOB: 07/22/1950   Date:01/23/2015       Progress Note  Subjective  Chief Complaint  Chief Complaint  Patient presents with  . Nicotine Dependence  . Diabetes    HPI Having low sugar spells 3-4 days a week.  Eats extra to recover.  Gaining weight.    He is trying to stop chewing tobacco, but has not stopped yet.   Has hard place onm L dorsal foot.  No pain.  May have gotten bigger. No problem-specific assessment & plan notes found for this encounter.   Past Medical History  Diagnosis Date  . Diabetes mellitus without complication (HCC)   . Heart burn   . Hyperlipidemia   . Allergy   . GERD (gastroesophageal reflux disease)   . Chronic headaches   . Hypertension     Social History  Substance Use Topics  . Smoking status: Former Smoker -- 1.00 packs/day for 39 years  . Smokeless tobacco: Current User    Types: Chew  . Alcohol Use: No     Current outpatient prescriptions:  .  allopurinol (ZYLOPRIM) 100 MG tablet, , Disp: , Rfl:  .  amLODipine (NORVASC) 2.5 MG tablet, , Disp: , Rfl:  .  colchicine 0.6 MG tablet, Take 0.6 mg by mouth daily., Disp: , Rfl:  .  fluticasone (FLONASE) 50 MCG/ACT nasal spray, Place into both nostrils daily., Disp: , Rfl:  .  glimepiride (AMARYL) 4 MG tablet, Take 2 tablets each morning with breakfast, Disp: 60 tablet, Rfl: 12 .  linagliptin (TRADJENTA) 5 MG TABS tablet, Take by mouth., Disp: , Rfl:  .  lisinopril (PRINIVIL,ZESTRIL) 40 MG tablet, , Disp: , Rfl:  .  metformin (FORTAMET) 1000 MG (OSM) 24 hr tablet, , Disp: , Rfl:  .  omeprazole (PRILOSEC) 20 MG capsule, , Disp: , Rfl:   Allergies  Allergen Reactions  . Prednisone Other (See Comments)    Review of Systems  Constitutional: Negative for fever, chills, weight loss and malaise/fatigue.  HENT: Negative for hearing loss.   Eyes: Negative for blurred vision and double vision.  Respiratory: Negative for cough, shortness of breath and  wheezing.   Cardiovascular: Negative for chest pain, palpitations and leg swelling.  Gastrointestinal: Negative for heartburn, abdominal pain and blood in stool.  Genitourinary: Negative for dysuria, urgency and frequency.  Musculoskeletal: Negative for myalgias and joint pain.       Knot on dorsal L. Foot.  Skin: Negative for rash.  Neurological: Negative for weakness and headaches.      Objective  Filed Vitals:   01/23/15 0839  BP: 154/88  Pulse: 74  Temp: 99.2 F (37.3 C)  TempSrc: Oral  Resp: 16  Height: 5\' 10"  (1.778 m)  Weight: 269 lb (122.018 kg)     Physical Exam  Constitutional: He is oriented to person, place, and time and well-developed, well-nourished, and in no distress. No distress.  HENT:  Head: Normocephalic and atraumatic.  Eyes: Conjunctivae and EOM are normal. Pupils are equal, round, and reactive to light. No scleral icterus.  Neck: Normal range of motion. Neck supple. Carotid bruit is not present. No thyromegaly present.  Cardiovascular: Normal rate, regular rhythm, normal heart sounds and intact distal pulses.  Exam reveals no gallop and no friction rub.   No murmur heard. Pulmonary/Chest: Effort normal and breath sounds normal. No respiratory distress. He has no wheezes. He has no rales.  Abdominal: Soft. Bowel sounds are  normal. He exhibits no distension, no abdominal bruit and no mass. There is no tenderness.  Musculoskeletal:  8 mm ganglion dorsum of L foot.  Not tender.  Lymphadenopathy:    He has no cervical adenopathy.  Neurological: He is alert and oriented to person, place, and time.  Vitals reviewed.     Recent Results (from the past 2160 hour(s))  Basic Metabolic Panel (BMET)     Status: None   Collection Time: 11/18/14  9:40 AM  Result Value Ref Range   Glucose 91 65 - 99 mg/dL   BUN 19 8 - 27 mg/dL   Creatinine, Ser 2.95 0.76 - 1.27 mg/dL   GFR calc non Af Amer 61 >59 mL/min/1.73   GFR calc Af Amer 71 >59 mL/min/1.73    BUN/Creatinine Ratio 15 10 - 22   Sodium 139 134 - 144 mmol/L   Potassium 4.5 3.5 - 5.2 mmol/L   Chloride 101 97 - 108 mmol/L   CO2 20 18 - 29 mmol/L   Calcium 9.9 8.6 - 10.2 mg/dL  POCT HgB A2Z     Status: Normal   Collection Time: 01/23/15  8:59 AM  Result Value Ref Range   Hemoglobin A1C 6.3      Assessment & Plan  1. Type 2 diabetes mellitus without complication, without long-term current use of insulin (HCC) - cont. Other meds other than Amaryl - POCT HgB A1C -6.3 - glimepiride (AMARYL) 4 MG tablet; Take 1 tablet (4 mg total) by mouth daily with breakfast.  Dispense: 30 tablet; Refill: 12  2. Essential hypertension -cont. current meds. 3. Obesity, Class II, BMI 35-39.9 -discussed weight l;oss.  4. Ganglion cyst of left foot

## 2015-01-23 NOTE — Patient Instructions (Signed)
Discussed stopping chewing tobacco and planned to try to stop.  Discussed weight loss Declined flu shot again today.

## 2015-05-12 ENCOUNTER — Ambulatory Visit: Payer: Commercial Indemnity | Admitting: Family Medicine

## 2015-05-14 ENCOUNTER — Encounter: Payer: Self-pay | Admitting: Family Medicine

## 2015-05-14 ENCOUNTER — Ambulatory Visit (INDEPENDENT_AMBULATORY_CARE_PROVIDER_SITE_OTHER): Payer: Managed Care, Other (non HMO) | Admitting: Family Medicine

## 2015-05-14 ENCOUNTER — Other Ambulatory Visit: Payer: Self-pay | Admitting: *Deleted

## 2015-05-14 VITALS — BP 150/80 | HR 93 | Temp 98.8°F | Resp 16 | Ht 70.0 in | Wt 266.8 lb

## 2015-05-14 DIAGNOSIS — E785 Hyperlipidemia, unspecified: Secondary | ICD-10-CM

## 2015-05-14 DIAGNOSIS — K219 Gastro-esophageal reflux disease without esophagitis: Secondary | ICD-10-CM

## 2015-05-14 DIAGNOSIS — M1000 Idiopathic gout, unspecified site: Secondary | ICD-10-CM | POA: Diagnosis not present

## 2015-05-14 DIAGNOSIS — E119 Type 2 diabetes mellitus without complications: Secondary | ICD-10-CM

## 2015-05-14 DIAGNOSIS — I10 Essential (primary) hypertension: Secondary | ICD-10-CM | POA: Diagnosis not present

## 2015-05-14 LAB — POCT GLYCOSYLATED HEMOGLOBIN (HGB A1C): Hemoglobin A1C: 7.8

## 2015-05-14 MED ORDER — AMLODIPINE BESYLATE 2.5 MG PO TABS: 5.0000 mg | ORAL_TABLET | Freq: Every morning | ORAL | Status: DC

## 2015-05-14 MED ORDER — GLIMEPIRIDE 4 MG PO TABS: ORAL_TABLET | ORAL | Status: DC

## 2015-05-14 MED ORDER — COLCHICINE 0.6 MG PO TABS: 0.6000 mg | ORAL_TABLET | ORAL | Status: DC | PRN

## 2015-05-14 MED ORDER — ALLOPURINOL 100 MG PO TABS: 100.0000 mg | ORAL_TABLET | Freq: Every day | ORAL | Status: DC

## 2015-05-14 MED ORDER — LINAGLIPTIN 5 MG PO TABS: 5.0000 mg | ORAL_TABLET | Freq: Every day | ORAL | Status: DC

## 2015-05-14 MED ORDER — OMEPRAZOLE 20 MG PO CPDR: 20.0000 mg | DELAYED_RELEASE_CAPSULE | Freq: Every day | ORAL | Status: DC

## 2015-05-14 MED ORDER — METFORMIN HCL ER (OSM) 1000 MG PO TB24: 1000.0000 mg | ORAL_TABLET | Freq: Two times a day (BID) | ORAL | Status: DC

## 2015-05-14 MED ORDER — LISINOPRIL 40 MG PO TABS: 40.0000 mg | ORAL_TABLET | Freq: Every day | ORAL | Status: DC

## 2015-05-14 MED ORDER — FLUTICASONE PROPIONATE 50 MCG/ACT NA SUSP: 2.0000 | Freq: Every day | NASAL | Status: DC

## 2015-05-14 NOTE — Progress Notes (Signed)
Name: [name]   MRN: [id number]    DOB: [birth year]   Date:05/14/2015       Progress Note  Subjective  Chief Complaint  Chief Complaint  Patient presents with  . Diabetes    HPI Here for f/u of DM and HBP.  He admits to eating poorly over the holidays.  He has started to watch his diet again.  He has no c/o.    No problem-specific assessment & plan notes found for this encounter.   Past Medical History  Diagnosis Date  . Diabetes mellitus without complication (HCC)   . Heart burn   . Hyperlipidemia   . Allergy   . GERD (gastroesophageal reflux disease)   . Chronic headaches   . Hypertension     Past Surgical History  Procedure Laterality Date  . Knee surgery    . Tonsillectomy    . Shoulder surgery      Family History  Problem Relation Age of Onset  . Cancer Father     lung  . Heart disease Sister   . Heart disease Brother     Social History   Social History  . Marital Status: Married    Spouse Name: N/A  . Number of Children: N/A  . Years of Education: N/A   Occupational History  . Not on file.   Social History Main Topics  . Smoking status: Former Smoker -- 1.00 packs/day for 39 years  . Smokeless tobacco: Current User    Types: Chew  . Alcohol Use: No  . Drug Use: No  . Sexual Activity: Yes    Birth Control/ Protection: Inserts   Other Topics Concern  . Not on file   Social History Narrative     Current outpatient prescriptions:  .  allopurinol (ZYLOPRIM) 100 MG tablet, Take 1 tablet (100 mg total) by mouth daily., Disp: 90 tablet, Rfl: 3 .  amLODipine (NORVASC) 2.5 MG tablet, Take 2 tablets (5 mg total) by mouth AC breakfast., Disp: 90 tablet, Rfl: 3 .  colchicine 0.6 MG tablet, Take 1 tablet (0.6 mg total) by mouth as needed., Disp: 90 tablet, Rfl: 3 .  fluticasone (FLONASE) 50 MCG/ACT nasal spray, Place 2 sprays into both nostrils daily., Disp: 16 g, Rfl: 12 .  glimepiride (AMARYL) 4 MG tablet, Take 1.5 tablets each day., Disp:  135 tablet, Rfl: 3 .  linagliptin (TRADJENTA) 5 MG TABS tablet, Take 1 tablet (5 mg total) by mouth daily., Disp: 90 tablet, Rfl: 3 .  lisinopril (PRINIVIL,ZESTRIL) 40 MG tablet, Take 1 tablet (40 mg total) by mouth daily., Disp: 90 tablet, Rfl: 3 .  metformin (FORTAMET) 1000 MG (OSM) 24 hr tablet, Take 1 tablet (1,000 mg total) by mouth 2 (two) times daily with a meal., Disp: 180 tablet, Rfl: 3 .  omeprazole (PRILOSEC) 20 MG capsule, Take 1 capsule (20 mg total) by mouth daily., Disp: 90 capsule, Rfl: 3  Allergies  Allergen Reactions  . Prednisone Other (See Comments)     Review of Systems  Constitutional: Negative for fever, chills, weight loss and malaise/fatigue.  HENT: Negative for hearing loss.   Eyes: Negative for blurred vision and double vision.  Respiratory: Negative for cough, shortness of breath and wheezing.   Cardiovascular: Negative for chest pain, palpitations and leg swelling.  Gastrointestinal: Negative for heartburn, nausea, vomiting, abdominal pain and blood in stool.  Genitourinary: Negative for dysuria, urgency and frequency.  Musculoskeletal: Negative for myalgias.  Skin: Negative for rash.  Neurological: Negative  for dizziness, tremors, weakness and headaches.      Objective  Filed Vitals:   05/14/15 0830 05/14/15 0915  BP: 146/75 150/80  Pulse: 93   Temp: 98.8 F (37.1 C)   TempSrc: Oral   Resp: 16   Height: 5\' 10"  (1.778 m)   Weight: 266 lb 12.8 oz (121.02 kg)     Physical Exam  Constitutional: He is oriented to person, place, and time and well-developed, well-nourished, and in no distress. No distress.  HENT:  Head: Normocephalic and atraumatic.  Eyes: Conjunctivae and EOM are normal. Pupils are equal, round, and reactive to light. No scleral icterus.  Neck: Normal range of motion. Neck supple. Carotid bruit is not present. No thyromegaly present.  Cardiovascular: Normal rate, regular rhythm and normal heart sounds.  Exam reveals no gallop and  no friction rub.   No murmur heard. Pulmonary/Chest: Effort normal and breath sounds normal. No respiratory distress. He has no wheezes. He has no rales.  Abdominal: Soft. Bowel sounds are normal. He exhibits no distension and no mass. There is no tenderness.  Musculoskeletal: Normal range of motion. He exhibits no edema.  Lymphadenopathy:    He has no cervical adenopathy.  Neurological: He is alert and oriented to person, place, and time.  Vitals reviewed.      Recent Results (from the past 2160 hour(s))  POCT HgB A1C     Status: Abnormal   Collection Time: 05/14/15  8:49 AM  Result Value Ref Range   Hemoglobin A1C 7.8%      Assessment & Plan  Problem List Items Addressed This Visit      Cardiovascular and Mediastinum   Hypertension   Relevant Medications   amLODipine (NORVASC) 2.5 MG tablet   lisinopril (PRINIVIL,ZESTRIL) 40 MG tablet     Digestive   GERD without esophagitis   Relevant Medications   omeprazole (PRILOSEC) 20 MG capsule     Endocrine   Diabetes (HCC) - Primary   Relevant Medications   glimepiride (AMARYL) 4 MG tablet   linagliptin (TRADJENTA) 5 MG TABS tablet   lisinopril (PRINIVIL,ZESTRIL) 40 MG tablet   metformin (FORTAMET) 1000 MG (OSM) 24 hr tablet   Other Relevant Orders   POCT HgB A1C (Completed)   [facility] Diabetes Foot Exam (Completed)     Other   Dyslipidemia   Gout   Relevant Medications   allopurinol (ZYLOPRIM) 100 MG tablet   colchicine 0.6 MG tablet      Meds ordered this encounter  Medications  . glimepiride (AMARYL) 4 MG tablet    Sig: Take 1.5 tablets each day.    Dispense:  135 tablet    Refill:  3  . amLODipine (NORVASC) 2.5 MG tablet    Sig: Take 2 tablets (5 mg total) by mouth AC breakfast.    Dispense:  90 tablet    Refill:  3  . allopurinol (ZYLOPRIM) 100 MG tablet    Sig: Take 1 tablet (100 mg total) by mouth daily.    Dispense:  90 tablet    Refill:  3  . colchicine 0.6 MG tablet    Sig: Take 1 tablet (0.6 mg  total) by mouth as needed.    Dispense:  90 tablet    Refill:  3  . fluticasone (FLONASE) 50 MCG/ACT nasal spray    Sig: Place 2 sprays into both nostrils daily.    Dispense:  16 g    Refill:  12  . linagliptin (TRADJENTA) 5 MG TABS tablet  Sig: Take 1 tablet (5 mg total) by mouth daily.    Dispense:  90 tablet    Refill:  3  . lisinopril (PRINIVIL,ZESTRIL) 40 MG tablet    Sig: Take 1 tablet (40 mg total) by mouth daily.    Dispense:  90 tablet    Refill:  3  . metformin (FORTAMET) 1000 MG (OSM) 24 hr tablet    Sig: Take 1 tablet (1,000 mg total) by mouth 2 (two) times daily with a meal.    Dispense:  180 tablet    Refill:  3  . omeprazole (PRILOSEC) 20 MG capsule    Sig: Take 1 capsule (20 mg total) by mouth daily.    Dispense:  90 capsule    Refill:  3   1. Type 2 diabetes mellitus without complication, without long-term current use of insulin (HCC)  - POCT HgB A1C - [facility] Diabetes Foot Exam - glimepiride (AMARYL) 4 MG tablet; Take 1.5 tablets each day.  Dispense: 135 tablet; Refill: 3 - linagliptin (TRADJENTA) 5 MG TABS tablet; Take 1 tablet (5 mg total) by mouth daily.  Dispense: 90 tablet; Refill: 3 - metformin (FORTAMET) 1000 MG (OSM) 24 hr tablet; Take 1 tablet (1,000 mg total) by mouth 2 (two) times daily with a meal.  Dispense: 180 tablet; Refill: 3  2. Essential hypertension  - amLODipine (NORVASC) 2.5 MG tablet; Take 2 tablets (5 mg total) by mouth AC breakfast.  Dispense: 90 tablet; Refill: 3 - lisinopril (PRINIVIL,ZESTRIL) 40 MG tablet; Take 1 tablet (40 mg total) by mouth daily.  Dispense: 90 tablet; Refill: 3  3. GERD without esophagitis  - omeprazole (PRILOSEC) 20 MG capsule; Take 1 capsule (20 mg total) by mouth daily.  Dispense: 90 capsule; Refill: 3  4. Dyslipidemia   5. Idiopathic gout, unspecified chronicity, unspecified site  - allopurinol (ZYLOPRIM) 100 MG tablet; Take 1 tablet (100 mg total) by mouth daily.  Dispense: 90 tablet; Refill: 3 -  colchicine 0.6 MG tablet; Take 1 tablet (0.6 mg total) by mouth as needed.  Dispense: 90 tablet; Refill: 3

## 2015-07-02 ENCOUNTER — Other Ambulatory Visit: Payer: Self-pay

## 2015-07-02 ENCOUNTER — Ambulatory Visit (INDEPENDENT_AMBULATORY_CARE_PROVIDER_SITE_OTHER): Payer: Commercial Indemnity | Admitting: Family Medicine

## 2015-07-02 ENCOUNTER — Encounter: Payer: Self-pay | Admitting: Family Medicine

## 2015-07-02 VITALS — BP 114/72 | HR 79 | Temp 99.5°F | Resp 16 | Ht 70.0 in | Wt 265.0 lb

## 2015-07-02 DIAGNOSIS — R300 Dysuria: Secondary | ICD-10-CM

## 2015-07-02 DIAGNOSIS — N3001 Acute cystitis with hematuria: Secondary | ICD-10-CM

## 2015-07-02 LAB — POCT URINALYSIS DIPSTICK
Bilirubin, UA: NEGATIVE
Glucose, UA: 2000
Ketones, UA: NEGATIVE
Leukocytes, UA: NEGATIVE
Nitrite, UA: NEGATIVE
Spec Grav, UA: >=1.03
Urobilinogen, UA: 0.2
pH, UA: 5

## 2015-07-02 MED ORDER — CIPROFLOXACIN HCL 500 MG PO TABS: 500.0000 mg | ORAL_TABLET | Freq: Two times a day (BID) | ORAL | Status: DC

## 2015-07-02 NOTE — Progress Notes (Signed)
Name: [name]   MRN: [id number]    DOB: 08-11-1950   Date:07/02/2015       Progress Note  Subjective  Chief Complaint  Chief Complaint  Patient presents with  . Dysuria    1-2 weeks  . Hematuria    2 days  . Back Pain    1-2 weeks low back    HPI C/o back pain that started 2-3 weeks ago.  Then noted some burning with urination.  Noted blood in urine 2 days ago.  Still some blood on and off.  Still with some dysuria.   Back pain only with sitting for a long time.  Movement helps it be better.  No problem-specific assessment & plan notes found for this encounter.   Past Medical History  Diagnosis Date  . Diabetes mellitus without complication (HCC)   . Heart burn   . Hyperlipidemia   . Allergy   . GERD (gastroesophageal reflux disease)   . Chronic headaches   . Hypertension     Social History  Substance Use Topics  . Smoking status: Former Smoker -- 1.00 packs/day for 39 years  . Smokeless tobacco: Current User    Types: Chew  . Alcohol Use: No     Current outpatient prescriptions:  .  allopurinol (ZYLOPRIM) 100 MG tablet, Take 1 tablet (100 mg total) by mouth daily., Disp: 90 tablet, Rfl: 3 .  amLODipine (NORVASC) 2.5 MG tablet, Take 2 tablets (5 mg total) by mouth AC breakfast., Disp: 90 tablet, Rfl: 3 .  colchicine 0.6 MG tablet, Take 1 tablet (0.6 mg total) by mouth as needed., Disp: 90 tablet, Rfl: 3 .  fluticasone (FLONASE) 50 MCG/ACT nasal spray, Place 2 sprays into both nostrils daily., Disp: 16 g, Rfl: 12 .  glimepiride (AMARYL) 4 MG tablet, Take 1.5 tablets each day., Disp: 135 tablet, Rfl: 3 .  linagliptin (TRADJENTA) 5 MG TABS tablet, Take 1 tablet (5 mg total) by mouth daily., Disp: 90 tablet, Rfl: 3 .  lisinopril (PRINIVIL,ZESTRIL) 40 MG tablet, Take 1 tablet (40 mg total) by mouth daily., Disp: 90 tablet, Rfl: 3 .  metformin (FORTAMET) 1000 MG (OSM) 24 hr tablet, Take 1 tablet (1,000 mg total) by mouth 2 (two) times daily with a meal., Disp: 180  tablet, Rfl: 3 .  omeprazole (PRILOSEC) 20 MG capsule, Take 1 capsule (20 mg total) by mouth daily., Disp: 90 capsule, Rfl: 3  Allergies  Allergen Reactions  . Prednisone Other (See Comments)    Review of Systems  Constitutional: Negative for fever, chills, weight loss and malaise/fatigue.  HENT: Negative for hearing loss.   Eyes: Negative for blurred vision and double vision.  Respiratory: Negative for cough, shortness of breath and wheezing.   Cardiovascular: Negative for chest pain, palpitations and leg swelling.  Gastrointestinal: Negative for heartburn, abdominal pain and blood in stool.  Genitourinary: Positive for dysuria, urgency, frequency and hematuria.  Skin: Negative for rash.  Neurological: Negative for tremors, weakness and headaches.      Objective  Filed Vitals:   07/02/15 1002  BP: 114/72  Pulse: 79  Temp: 99.5 F (37.5 C)  TempSrc: Oral  Resp: 16  Height:  (1.778 m)  Weight: 265 lb (120.203 kg)     Physical Exam  Constitutional: He is oriented to person, place, and time and well-developed, well-nourished, and in no distress. No distress.  HENT:  Head: Normocephalic and atraumatic.  Cardiovascular: Normal rate, regular rhythm and normal heart sounds.  Pulmonary/Chest: Effort normal and breath sounds normal.  Abdominal: Soft. Bowel sounds are normal. He exhibits no distension and no mass. There is tenderness (Mild diffuse lower abd. tenderness).  No CVA tenderness.  Genitourinary: Penis normal.  Prostate sl. enlarged, non -tender, not boggy, no nodules  Neurological: He is oriented to person, place, and time.  Vitals reviewed.     Recent Results (from the past 2160 hour(s))  POCT HgB A1C     Status: Abnormal   Collection Time: 05/14/15  8:49 AM  Result Value Ref Range   Hemoglobin A1C 7.8%   POCT urinalysis dipstick     Status: Abnormal   Collection Time: 07/02/15 10:11 AM  Result Value Ref Range   Color, UA Straw    Clarity, UA  Clear     Comment: with mucous   Glucose, UA 2000    Bilirubin, UA Neg    Ketones, UA Neg    Spec Grav, UA >=1.030    Blood, UA Mod (non)    pH, UA 5.0    Protein, UA 30+    Urobilinogen, UA 0.2    Nitrite, UA Neg    Leukocytes, UA Negative Negative     Assessment & Plan  1. Dysuria- with hematuria  - POCT urinalysis dipstick-3+ blood. - Urine Culture  2. Acute cystitis with hematuria  - ciprofloxacin (CIPRO) 500 MG tablet; Take 1 tablet (500 mg total) by mouth 2 (two) times daily.  Dispense: 20 tablet; Refill: 0 RTC-2 weeks

## 2015-07-03 ENCOUNTER — Other Ambulatory Visit: Payer: Self-pay | Admitting: Family Medicine

## 2015-07-07 LAB — URINE CULTURE: Organism ID, Bacteria: NO GROWTH

## 2015-07-16 ENCOUNTER — Other Ambulatory Visit: Payer: Self-pay | Admitting: Family Medicine

## 2015-07-16 ENCOUNTER — Encounter: Payer: Self-pay | Admitting: Family Medicine

## 2015-07-16 ENCOUNTER — Ambulatory Visit (INDEPENDENT_AMBULATORY_CARE_PROVIDER_SITE_OTHER): Payer: Commercial Indemnity | Admitting: Family Medicine

## 2015-07-16 VITALS — BP 124/76 | HR 81 | Temp 99.3°F | Resp 16 | Ht 70.0 in | Wt 258.0 lb

## 2015-07-16 DIAGNOSIS — N390 Urinary tract infection, site not specified: Secondary | ICD-10-CM | POA: Diagnosis not present

## 2015-07-16 DIAGNOSIS — Z87448 Personal history of other diseases of urinary system: Secondary | ICD-10-CM | POA: Diagnosis not present

## 2015-07-16 LAB — POCT URINALYSIS DIPSTICK
Bilirubin, UA: NEGATIVE
Blood, UA: NEGATIVE
Glucose, UA: NEGATIVE
Ketones, UA: NEGATIVE
Leukocytes, UA: NEGATIVE
Nitrite, UA: NEGATIVE
Protein, UA: NEGATIVE
Spec Grav, UA: 1.015
Urobilinogen, UA: 0.2
pH, UA: 5

## 2015-07-16 NOTE — Progress Notes (Signed)
Name: [name]   MRN: [id number]    DOB: 06-10-1950   Date:07/16/2015       Progress Note  Subjective  Chief Complaint  Chief Complaint  Patient presents with  . Urinary Tract Infection    Follow up- Patient has burning still. Patient has another round of antibiotics at home from [facility].     HPI Here for f/u of hematuria and pyuria.  Urine culture had no growth,, but was submitted after 1 day of antibiotic because of office error.  He has some mild dysuria day before yesterday.  None since.  No more blood seen.  Urine dip today was clear.  No problem-specific assessment & plan notes found for this encounter.   Past Medical History  Diagnosis Date  . Diabetes mellitus without complication (HCC)   . Heart burn   . Hyperlipidemia   . Allergy   . GERD (gastroesophageal reflux disease)   . Chronic headaches   . Hypertension     Past Surgical History  Procedure Laterality Date  . Knee surgery    . Tonsillectomy    . Shoulder surgery      Family History  Problem Relation Age of Onset  . Cancer Father     lung  . Heart disease Sister   . Heart disease Brother     Social History   Social History  . Marital Status: Married    Spouse Name: N/A  . Number of Children: N/A  . Years of Education: N/A   Occupational History  . Not on file.   Social History Main Topics  . Smoking status: Former Smoker -- 1.00 packs/day for 39 years  . Smokeless tobacco: Current User    Types: Chew  . Alcohol Use: No  . Drug Use: No  . Sexual Activity: Yes    Birth Control/ Protection: Inserts   Other Topics Concern  . Not on file   Social History Narrative     Current outpatient prescriptions:  .  allopurinol (ZYLOPRIM) 100 MG tablet, Take 1 tablet (100 mg total) by mouth daily., Disp: 90 tablet, Rfl: 3 .  amLODipine (NORVASC) 2.5 MG tablet, Take 2 tablets (5 mg total) by mouth AC breakfast., Disp: 90 tablet, Rfl: 3 .  colchicine 0.6 MG tablet, Take 1 tablet (0.6 mg  total) by mouth as needed., Disp: 90 tablet, Rfl: 3 .  fluticasone (FLONASE) 50 MCG/ACT nasal spray, Place 2 sprays into both nostrils daily., Disp: 16 g, Rfl: 12 .  glimepiride (AMARYL) 4 MG tablet, Take 1.5 tablets each day., Disp: 135 tablet, Rfl: 3 .  linagliptin (TRADJENTA) 5 MG TABS tablet, Take 1 tablet (5 mg total) by mouth daily., Disp: 90 tablet, Rfl: 3 .  lisinopril (PRINIVIL,ZESTRIL) 40 MG tablet, Take 1 tablet (40 mg total) by mouth daily., Disp: 90 tablet, Rfl: 3 .  metformin (FORTAMET) 1000 MG (OSM) 24 hr tablet, Take 1 tablet (1,000 mg total) by mouth 2 (two) times daily with a meal., Disp: 180 tablet, Rfl: 3 .  omeprazole (PRILOSEC) 20 MG capsule, Take 1 capsule (20 mg total) by mouth daily., Disp: 90 capsule, Rfl: 3  Allergies  Allergen Reactions  . Prednisone Other (See Comments)     Review of Systems  Constitutional: Negative for fever, chills, weight loss and malaise/fatigue.  HENT: Negative for hearing loss.   Eyes: Negative for blurred vision and double vision.  Respiratory: Negative for cough, shortness of breath and wheezing.   Cardiovascular: Negative for chest  pain, palpitations and leg swelling.  Gastrointestinal: Negative for heartburn, abdominal pain and blood in stool.  Genitourinary: Positive for dysuria (last 2 days ago and mild). Negative for urgency and frequency.  Musculoskeletal: Negative for myalgias and back pain.  Skin: Negative for rash.  Neurological: Negative for weakness and headaches.      Objective  Filed Vitals:   07/16/15 1517  BP: 124/76  Pulse: 81  Temp: 99.3 F (37.4 C)  TempSrc: Oral  Resp: 16  Height: 5\' 10"  (1.778 m)  Weight: 258 lb (117.028 kg)    Physical Exam  Constitutional: He is oriented to person, place, and time and well-developed, well-nourished, and in no distress. No distress.  HENT:  Head: Normocephalic and atraumatic.  Abdominal: Soft. He exhibits no distension and no mass. There is no tenderness.   Obese.  No CVA tenderness.  Neurological: He is alert and oriented to person, place, and time.  Vitals reviewed.      Recent Results (from the past 2160 hour(s))  POCT HgB A1C     Status: Abnormal   Collection Time: 05/14/15  8:49 AM  Result Value Ref Range   Hemoglobin A1C 7.8%   POCT urinalysis dipstick     Status: Abnormal   Collection Time: 07/02/15 10:11 AM  Result Value Ref Range   Color, UA Straw    Clarity, UA Clear     Comment: with mucous   Glucose, UA 2000    Bilirubin, UA Neg    Ketones, UA Neg    Spec Grav, UA >=1.030    Blood, UA Mod (non)    pH, UA 5.0    Protein, UA 30+    Urobilinogen, UA 0.2    Nitrite, UA Neg    Leukocytes, UA Negative Negative  Urine culture     Status: None   Collection Time: 07/03/15 12:00 AM  Result Value Ref Range   Urine Culture, Routine Final report    Urine Culture result 1 No growth      Assessment & Plan  Problem List Items Addressed This Visit      Other   Hx of hematuria   Relevant Orders   US Renal   POCT Urinalysis Dipstick    Other Visit Diagnoses    Urinary tract infection without hematuria, site unspecified    -  Primary    Relevant Orders    Urine Culture     1. Urinary tract infection without hematuria, site unspecified  - Urine Culture  2. Hx of hematuria  - US Renal; Future - POCT Urinalysis Dipstick  No orders of the defined types were placed in this encounter.

## 2015-07-16 NOTE — Addendum Note (Signed)
Addended by: Clayborne ArtistHOLDEN, JAMIE A on: 07/16/2015 04:26 PM   Modules accepted: K[employer] BroodSmartSet

## 2015-07-17 ENCOUNTER — Telehealth: Payer: Self-pay | Admitting: *Deleted

## 2015-07-17 NOTE — Telephone Encounter (Signed)
Left appt detail on vmail. Also called patient and gave verbal time/date/location to patient on cell#. Appt 07/22/15 at 3:45 pm.

## 2015-07-18 LAB — URINE CULTURE: Organism ID, Bacteria: NO GROWTH

## 2015-07-22 ENCOUNTER — Ambulatory Visit
Admission: RE | Admit: 2015-07-22 | Discharge: 2015-07-22 | Disposition: A | Discharge status: Home / Self Care | Payer: Managed Care, Other (non HMO) | Source: Ambulatory Visit | Attending: Family Medicine | Admitting: Family Medicine

## 2015-07-22 DIAGNOSIS — Z87448 Personal history of other diseases of urinary system: Secondary | ICD-10-CM | POA: Diagnosis present

## 2015-07-22 DIAGNOSIS — Q6102 Congenital multiple renal cysts: Secondary | ICD-10-CM | POA: Diagnosis not present

## 2015-07-22 DIAGNOSIS — Q631 Lobulated, fused and horseshoe kidney: Secondary | ICD-10-CM | POA: Diagnosis not present

## 2015-08-20 ENCOUNTER — Ambulatory Visit: Payer: Managed Care, Other (non HMO) | Admitting: Family Medicine

## 2015-08-21 ENCOUNTER — Ambulatory Visit: Payer: Managed Care, Other (non HMO) | Admitting: Family Medicine

## 2015-10-08 ENCOUNTER — Ambulatory Visit (INDEPENDENT_AMBULATORY_CARE_PROVIDER_SITE_OTHER): Payer: Commercial Indemnity | Admitting: Family Medicine

## 2015-10-08 ENCOUNTER — Encounter: Payer: Self-pay | Admitting: Family Medicine

## 2015-10-08 VITALS — BP 120/60 | HR 96 | Temp 99.6°F | Resp 16 | Ht 70.0 in | Wt 258.0 lb

## 2015-10-08 DIAGNOSIS — K219 Gastro-esophageal reflux disease without esophagitis: Secondary | ICD-10-CM | POA: Diagnosis not present

## 2015-10-08 DIAGNOSIS — M5442 Lumbago with sciatica, left side: Secondary | ICD-10-CM | POA: Diagnosis not present

## 2015-10-08 DIAGNOSIS — M545 Low back pain, unspecified: Secondary | ICD-10-CM | POA: Insufficient documentation

## 2015-10-08 DIAGNOSIS — M1000 Idiopathic gout, unspecified site: Secondary | ICD-10-CM

## 2015-10-08 DIAGNOSIS — I10 Essential (primary) hypertension: Secondary | ICD-10-CM | POA: Diagnosis not present

## 2015-10-08 DIAGNOSIS — E119 Type 2 diabetes mellitus without complications: Secondary | ICD-10-CM | POA: Diagnosis not present

## 2015-10-08 LAB — POCT URINALYSIS DIPSTICK
Bilirubin, UA: NEGATIVE
Blood, UA: NEGATIVE
Color, UA: NEGATIVE
Glucose, UA: NEGATIVE
Ketones, UA: NEGATIVE
Leukocytes, UA: NEGATIVE
Nitrite, UA: NEGATIVE
Protein, UA: 30
Spec Grav, UA: 1.015
Urobilinogen, UA: 0.2
pH, UA: 6

## 2015-10-08 LAB — POCT GLYCOSYLATED HEMOGLOBIN (HGB A1C): Hemoglobin A1C: 9.7

## 2015-10-08 MED ORDER — METAXALONE 800 MG PO TABS: 800.0000 mg | ORAL_TABLET | Freq: Three times a day (TID) | ORAL | Status: DC

## 2015-10-08 MED ORDER — NAPROXEN 500 MG PO TABS: 500.0000 mg | ORAL_TABLET | Freq: Two times a day (BID) | ORAL | Status: DC

## 2015-10-08 MED ORDER — AMLODIPINE BESYLATE 2.5 MG PO TABS: 2.5000 mg | ORAL_TABLET | Freq: Every day | ORAL | Status: DC

## 2015-10-08 MED ORDER — GLIMEPIRIDE 4 MG PO TABS: ORAL_TABLET | ORAL | Status: DC

## 2015-10-08 NOTE — Addendum Note (Signed)
Addended by: Alease FrameARTER, SONYA S on: 10/08/2015 02:47 PM   Modules accepted: Orders

## 2015-10-08 NOTE — Progress Notes (Signed)
Name: [name]   MRN: [id number]    DOB: 02/27/1951   Date:10/08/2015       Progress Note  Subjective  Chief Complaint  Chief Complaint  Patient presents with  . Diabetes  . Back Pain    HPI Here for f/u of HB P and DM.  He has not followed any good diet.  HE is not checking his BSs at home.  He is taking 1, 4 mg Amaryl and has reduced Amlodipine to 1, 2.5 mg tab daily.  No problem-specific assessment & plan notes found for this encounter.   Past Medical History  Diagnosis Date  . Diabetes mellitus without complication (HCC)   . Heart burn   . Hyperlipidemia   . Allergy   . GERD (gastroesophageal reflux disease)   . Chronic headaches   . Hypertension     Past Surgical History  Procedure Laterality Date  . Knee surgery    . Tonsillectomy    . Shoulder surgery      Family History  Problem Relation Age of Onset  . Cancer Father     lung  . Heart disease Sister   . Heart disease Brother     Social History   Social History  . Marital Status: Married    Spouse Name: N/A  . Number of Children: N/A  . Years of Education: N/A   Occupational History  . Not on file.   Social History Main Topics  . Smoking status: Former Smoker -- 1.00 packs/day for 39 years  . Smokeless tobacco: Current User    Types: Chew  . Alcohol Use: No  . Drug Use: No  . Sexual Activity: Yes    Birth Control/ Protection: Inserts   Other Topics Concern  . Not on file   Social History Narrative     Current outpatient prescriptions:  .  allopurinol (ZYLOPRIM) 100 MG tablet, Take 1 tablet (100 mg total) by mouth daily., Disp: 90 tablet, Rfl: 3 .  amLODipine (NORVASC) 2.5 MG tablet, Take 1 tablet (2.5 mg total) by mouth daily., Disp: 90 tablet, Rfl: 3 .  colchicine 0.6 MG tablet, Take 1 tablet (0.6 mg total) by mouth as needed., Disp: 90 tablet, Rfl: 3 .  fluticasone (FLONASE) 50 MCG/ACT nasal spray, Place 2 sprays into both nostrils daily., Disp: 16 g, Rfl: 12 .  glimepiride  (AMARYL) 4 MG tablet, Take 2 tablets each day., Disp: 180 tablet, Rfl: 3 .  linagliptin (TRADJENTA) 5 MG TABS tablet, Take 1 tablet (5 mg total) by mouth daily., Disp: 90 tablet, Rfl: 3 .  lisinopril (PRINIVIL,ZESTRIL) 40 MG tablet, Take 1 tablet (40 mg total) by mouth daily., Disp: 90 tablet, Rfl: 3 .  metformin (FORTAMET) 1000 MG (OSM) 24 hr tablet, Take 1 tablet (1,000 mg total) by mouth 2 (two) times daily with a meal., Disp: 180 tablet, Rfl: 3 .  omeprazole (PRILOSEC) 20 MG capsule, Take 1 capsule (20 mg total) by mouth daily., Disp: 90 capsule, Rfl: 3 .  metaxalone (SKELAXIN) 800 MG tablet, Take 1 tablet (800 mg total) by mouth 3 (three) times daily., Disp: 30 tablet, Rfl: 6 .  naproxen (NAPROSYN) 500 MG tablet, Take 1 tablet (500 mg total) by mouth 2 (two) times daily with a meal., Disp: 60 tablet, Rfl: 6  Allergies  Allergen Reactions  . Prednisone Other (See Comments)     Review of Systems  Constitutional: Negative for fever, chills, weight loss and malaise/fatigue.  HENT: Negative for hearing loss.  Eyes: Negative for blurred vision and double vision.  Respiratory: Negative for cough, shortness of breath and wheezing.   Cardiovascular: Negative for chest pain, palpitations and leg swelling.  Gastrointestinal: Negative for heartburn, abdominal pain and blood in stool.  Genitourinary: Negative for dysuria, urgency and frequency.  Musculoskeletal: Positive for back pain.  Skin: Negative for rash.  Neurological: Negative for dizziness, tremors, weakness and headaches.      Objective  Filed Vitals:   10/08/15 1345 10/08/15 1423  BP: 133/79 120/60  Pulse: 103 96  Temp: 99.6 F (37.6 C)   TempSrc: Oral   Resp: 16   Height: 5\' 10"  (1.778 m)   Weight: 258 lb (117.028 kg)     Physical Exam  Constitutional: He is oriented to person, place, and time and well-developed, well-nourished, and in no distress. No distress.  HENT:  Head: Normocephalic and atraumatic.  Eyes:  Conjunctivae and EOM are normal. Pupils are equal, round, and reactive to light. No scleral icterus.  Neck: Normal range of motion. Neck supple. Carotid bruit is present. No thyromegaly present.  Cardiovascular: Normal rate, regular rhythm and normal heart sounds.  Exam reveals no gallop and no friction rub.   No murmur heard. Pulmonary/Chest: Effort normal and breath sounds normal. No respiratory distress. He has no wheezes. He has no rales.  Musculoskeletal: He exhibits no edema.  Pain over lower lumbar spine and radiates to L upper thigh and buttock.  Neg foot drop.  Lymphadenopathy:    He has no cervical adenopathy.  Neurological: He is alert and oriented to person, place, and time.  Vitals reviewed.      Recent Results (from the past 2160 hour(s))  Urine culture     Status: None   Collection Time: 07/16/15  3:00 AM  Result Value Ref Range   Urine Culture, Routine Final report    Urine Culture result 1 No growth   POCT Urinalysis Dipstick     Status: Normal   Collection Time: 07/16/15  4:19 PM  Result Value Ref Range   Color, UA yellow    Clarity, UA cloudy    Glucose, UA neg    Bilirubin, UA neg    Ketones, UA neg    Spec Grav, UA 1.015    Blood, UA neg    pH, UA 5.0    Protein, UA neg    Urobilinogen, UA 0.2    Nitrite, UA neg    Leukocytes, UA Negative Negative     Assessment & Plan  Problem List Items Addressed This Visit      Cardiovascular and Mediastinum   Hypertension   Relevant Medications   amLODipine (NORVASC) 2.5 MG tablet     Digestive   GERD without esophagitis     Endocrine   Diabetes (HCC) - Primary   Relevant Medications   glimepiride (AMARYL) 4 MG tablet   Other Relevant Orders   POCT HgB A1C     Other   Gout   Low back pain   Relevant Medications   naproxen (NAPROSYN) 500 MG tablet   metaxalone (SKELAXIN) 800 MG tablet   Other Relevant Orders   POCT Urinalysis Dipstick      Meds ordered this encounter  Medications  .  glimepiride (AMARYL) 4 MG tablet    Sig: Take 2 tablets each day.    Dispense:  180 tablet    Refill:  3  . amLODipine (NORVASC) 2.5 MG tablet    Sig: Take 1 tablet (2.5 mg total)  by mouth daily.    Dispense:  90 tablet    Refill:  3  . naproxen (NAPROSYN) 500 MG tablet    Sig: Take 1 tablet (500 mg total) by mouth 2 (two) times daily with a meal.    Dispense:  60 tablet    Refill:  6  . metaxalone (SKELAXIN) 800 MG tablet    Sig: Take 1 tablet (800 mg total) by mouth 3 (three) times daily.    Dispense:  30 tablet    Refill:  6   1. Type 2 diabetes mellitus without complication, without long-term current use of insulin (HCC)  - POCT HgB A1C-9.6 - glimepiride (AMARYL) 4 MG tablet; Take 2 tablets each day.  Dispense: 180 tablet; Refill: 3 Cont. Metformin, and Tradjenta 2. Low back pain without sciatica, unspecified back pain laterality  - POCT Urinalysis Dipstick  3. Essential hypertension Cont Lisinopril - amLODipine (NORVASC) 2.5 MG tablet; Take 1 tablet (2.5 mg total) by mouth daily.  Dispense: 90 tablet; Refill: 3  4. GERD without esophagitis   5. Idiopathic gout, unspecified chronicity, unspecified site   6. Left-sided low back pain with left-sided sciatica  - naproxen (NAPROSYN) 500 MG tablet; Take 1 tablet (500 mg total) by mouth 2 (two) times daily with a meal.  Dispense: 60 tablet; Refill: 6 - metaxalone (SKELAXIN) 800 MG tablet; Take 1 tablet (800 mg total) by mouth 3 (three) times daily.  Dispense: 30 tablet; Refill: 6

## 2015-11-17 ENCOUNTER — Encounter: Payer: Self-pay | Admitting: Family Medicine

## 2015-11-17 ENCOUNTER — Ambulatory Visit (INDEPENDENT_AMBULATORY_CARE_PROVIDER_SITE_OTHER): Payer: Commercial Indemnity | Admitting: Family Medicine

## 2015-11-17 VITALS — BP 121/76 | HR 83 | Temp 99.2°F | Resp 16 | Ht 70.0 in | Wt 262.0 lb

## 2015-11-17 DIAGNOSIS — T148XXA Other injury of unspecified body region, initial encounter: Secondary | ICD-10-CM | POA: Insufficient documentation

## 2015-11-17 DIAGNOSIS — E669 Obesity, unspecified: Secondary | ICD-10-CM | POA: Diagnosis not present

## 2015-11-17 DIAGNOSIS — M5442 Lumbago with sciatica, left side: Secondary | ICD-10-CM

## 2015-11-17 DIAGNOSIS — T148 Other injury of unspecified body region: Secondary | ICD-10-CM | POA: Diagnosis not present

## 2015-11-17 MED ORDER — NAPROXEN 500 MG PO TABS: 500.0000 mg | ORAL_TABLET | Freq: Two times a day (BID) | ORAL | 6 refills | Status: DC

## 2015-11-17 MED ORDER — BACLOFEN 10 MG PO TABS: 10.0000 mg | ORAL_TABLET | Freq: Three times a day (TID) | ORAL | 3 refills | Status: DC

## 2015-11-17 NOTE — Progress Notes (Signed)
Name: [name]   MRN: [id number]    DOB: 04/15/1950   Date:11/17/2015       Progress Note  Subjective  Chief Complaint  Chief Complaint  Patient presents with  . Leg Pain    HPI Here c/o R groin and upper leg pain for about 3 weeks.  Seemed to start after getting R leg strained lifting something heavy for work.  HE is still ative.  Pain somewhat better today.  No problem-specific Assessment & Plan notes found for this encounter.   Past Medical History:  Diagnosis Date  . Allergy   . Chronic headaches   . Diabetes mellitus without complication (HCC)   . GERD (gastroesophageal reflux disease)   . Heart burn   . Hyperlipidemia   . Hypertension     Social History  Substance Use Topics  . Smoking status: Former Smoker    Packs/day: 1.00    Years: 39.00  . Smokeless tobacco: Current User    Types: Chew  . Alcohol use No     Current Outpatient Prescriptions:  .  allopurinol (ZYLOPRIM) 100 MG tablet, Take 1 tablet (100 mg total) by mouth daily., Disp: 90 tablet, Rfl: 3 .  amLODipine (NORVASC) 2.5 MG tablet, Take 1 tablet (2.5 mg total) by mouth daily., Disp: 90 tablet, Rfl: 3 .  colchicine 0.6 MG tablet, Take 1 tablet (0.6 mg total) by mouth as needed., Disp: 90 tablet, Rfl: 3 .  fluticasone (FLONASE) 50 MCG/ACT nasal spray, Place 2 sprays into both nostrils daily., Disp: 16 g, Rfl: 12 .  glimepiride (AMARYL) 4 MG tablet, Take 2 tablets each day., Disp: 180 tablet, Rfl: 3 .  linagliptin (TRADJENTA) 5 MG TABS tablet, Take 1 tablet (5 mg total) by mouth daily., Disp: 90 tablet, Rfl: 3 .  lisinopril (PRINIVIL,ZESTRIL) 40 MG tablet, Take 1 tablet (40 mg total) by mouth daily., Disp: 90 tablet, Rfl: 3 .  metformin (FORTAMET) 1000 MG (OSM) 24 hr tablet, Take 1 tablet (1,000 mg total) by mouth 2 (two) times daily with a meal., Disp: 180 tablet, Rfl: 3 .  naproxen (NAPROSYN) 500 MG tablet, Take 1 tablet (500 mg total) by mouth 2 (two) times daily with a meal., Disp: 60 tablet,  Rfl: 6 .  omeprazole (PRILOSEC) 20 MG capsule, Take 1 capsule (20 mg total) by mouth daily., Disp: 90 capsule, Rfl: 3 .  metaxalone (SKELAXIN) 800 MG tablet, Take 1 tablet (800 mg total) by mouth 3 (three) times daily. (Patient not taking: Reported on 11/17/2015), Disp: 30 tablet, Rfl: 6  Allergies  Allergen Reactions  . Prednisone Other (See Comments)    Review of Systems  Constitutional: Negative.   HENT: Negative.   Eyes: Negative.   Respiratory: Negative.   Cardiovascular: Negative.   Gastrointestinal: Negative.   Musculoskeletal: Positive for joint pain (R hip/ leg, and groin).  Skin: Negative.       Objective  Vitals:   11/17/15 1406  BP: 121/76  Pulse: 83  Resp: 16  Temp: 99.2 F (37.3 C)  TempSrc: Oral  Weight: 262 lb (118.8 kg)  Height: 5\' 10"  (1.778 m)     Physical Exam  Constitutional: He is well-developed, well-nourished, and in no distress. No distress.  Obese with some weight gain  HENT:  Head: Normocephalic and atraumatic.  Musculoskeletal:  Mild pain to ROM and palpation in R groin and R upper thigh .  No gross muscular or neurologic findings.  Vitals reviewed.     Recent Results (from  the past 2160 hour(s))  POCT HgB A1C     Status: Abnormal   Collection Time: 10/08/15  2:40 PM  Result Value Ref Range   Hemoglobin A1C 9.7%   POCT Urinalysis Dipstick     Status: Abnormal   Collection Time: 10/08/15  2:45 PM  Result Value Ref Range   Color, UA neg    Clarity, UA clear    Glucose, UA neg    Bilirubin, UA neg    Ketones, UA neg    Spec Grav, UA 1.015    Blood, UA neg    pH, UA 6.0    Protein, UA 30    Urobilinogen, UA 0.2    Nitrite, UA neg    Leukocytes, UA Negative Negative     Assessment & Pla   2. Muscle strain  - naproxen (NAPROSYN) 500 MG tablet; Take 1 tablet (500 mg total) by mouth 2 (two) times daily with a meal.  Dispense: 60 tablet; Refill: 6 - baclofen (LIORESAL) 10 MG tablet; Take 1 tablet (10 mg total) by mouth 3  (three) times daily.  Dispense: 30 each; Refill: 3  3. Obesity, Class II, BMI 35-39.9  - Amb ref to Medical Nutrition Therapy-M[facility]

## 2015-12-07 ENCOUNTER — Encounter: Payer: Self-pay | Admitting: Emergency Medicine

## 2015-12-07 ENCOUNTER — Emergency Department
Admission: EM | Admit: 2015-12-07 | Discharge: 2015-12-07 | Disposition: A | Discharge status: Home / Self Care | Payer: Managed Care, Other (non HMO) | Attending: Emergency Medicine | Admitting: Emergency Medicine

## 2015-12-07 ENCOUNTER — Emergency Department: Payer: Managed Care, Other (non HMO)

## 2015-12-07 DIAGNOSIS — I10 Essential (primary) hypertension: Secondary | ICD-10-CM | POA: Diagnosis not present

## 2015-12-07 DIAGNOSIS — Y999 Unspecified external cause status: Secondary | ICD-10-CM | POA: Diagnosis not present

## 2015-12-07 DIAGNOSIS — Y929 Unspecified place or not applicable: Secondary | ICD-10-CM | POA: Diagnosis not present

## 2015-12-07 DIAGNOSIS — Z87891 Personal history of nicotine dependence: Secondary | ICD-10-CM | POA: Diagnosis not present

## 2015-12-07 DIAGNOSIS — E119 Type 2 diabetes mellitus without complications: Secondary | ICD-10-CM | POA: Insufficient documentation

## 2015-12-07 DIAGNOSIS — W208XXA Other cause of strike by thrown, projected or falling object, initial encounter: Secondary | ICD-10-CM | POA: Insufficient documentation

## 2015-12-07 DIAGNOSIS — Y9389 Activity, other specified: Secondary | ICD-10-CM | POA: Diagnosis not present

## 2015-12-07 DIAGNOSIS — M79604 Pain in right leg: Secondary | ICD-10-CM

## 2015-12-07 DIAGNOSIS — R1031 Right lower quadrant pain: Secondary | ICD-10-CM | POA: Diagnosis present

## 2015-12-07 DIAGNOSIS — Z7984 Long term (current) use of oral hypoglycemic drugs: Secondary | ICD-10-CM | POA: Diagnosis not present

## 2015-12-07 LAB — URINALYSIS COMPLETE WITH MICROSCOPIC (ARMC ONLY)
Bacteria, UA: NONE SEEN
Bilirubin Urine: NEGATIVE
Glucose, UA: >500 mg/dL — AB
Hgb urine dipstick: NEGATIVE
Ketones, ur: NEGATIVE mg/dL
Leukocytes, UA: NEGATIVE
Nitrite: NEGATIVE
Protein, ur: NEGATIVE mg/dL
Specific Gravity, Urine: 1.02 (ref 1.005–1.030)
pH: 5 (ref 5.0–8.0)

## 2015-12-07 LAB — COMPREHENSIVE METABOLIC PANEL
ALT: 25 U/L (ref 17–63)
AST: 27 U/L (ref 15–41)
Albumin: 4.2 g/dL (ref 3.5–5.0)
Alkaline Phosphatase: 47 U/L (ref 38–126)
Anion gap: 7 (ref 5–15)
BUN: 21 mg/dL — ABNORMAL HIGH (ref 6–20)
CO2: 26 mmol/L (ref 22–32)
Calcium: 9.6 mg/dL (ref 8.9–10.3)
Chloride: 103 mmol/L (ref 101–111)
Creatinine, Ser: 1.27 mg/dL — ABNORMAL HIGH (ref 0.61–1.24)
GFR calc Af Amer: >60 mL/min (ref >60)
GFR calc non Af Amer: 58 mL/min — ABNORMAL LOW (ref >60)
Glucose, Bld: 153 mg/dL — ABNORMAL HIGH (ref 65–99)
Potassium: 4.3 mmol/L (ref 3.5–5.1)
Sodium: 136 mmol/L (ref 135–145)
Total Bilirubin: 0.3 mg/dL (ref 0.3–1.2)
Total Protein: 8 g/dL (ref 6.5–8.1)

## 2015-12-07 LAB — LIPASE, BLOOD: Lipase: 26 U/L (ref 11–51)

## 2015-12-07 LAB — CBC
HCT: 43.5 % (ref 40.0–52.0)
Hemoglobin: 14.5 g/dL (ref 13.0–18.0)
MCH: 29.3 pg (ref 26.0–34.0)
MCHC: 33.3 g/dL (ref 32.0–36.0)
MCV: 88.1 fL (ref 80.0–100.0)
Platelets: 260 10*3/uL (ref 150–440)
RBC: 4.94 MIL/uL (ref 4.40–5.90)
RDW: 14.5 % (ref 11.5–14.5)
WBC: 8.1 10*3/uL (ref 3.8–10.6)

## 2015-12-07 MED ORDER — IBUPROFEN 800 MG PO TABS
ORAL_TABLET | ORAL | Status: AC
  Filled 2015-12-07: qty 1

## 2015-12-07 MED ORDER — IBUPROFEN 800 MG PO TABS
800.0000 mg | ORAL_TABLET | Freq: Once | ORAL | Status: AC
  Administered 2015-12-07: 800 mg via ORAL

## 2015-12-07 NOTE — ED Triage Notes (Signed)
Says rigth lower abd pain for 1 month.  Dr Juanetta Goslinghawkins gave him some muscle relaxers and that did not work.  He came to [facility] today and they would not see him so he came here.

## 2015-12-07 NOTE — ED Provider Notes (Addendum)
[facility] Emergency Department Provider Note ____________________________________________   I have reviewed the triage vital signs and the triage nursing note.  HISTORY  Chief Complaint Abdominal Pain   Historian Patient  HPI [name] is a 65 y.o. male who is here for right groin pain. He states that the pain started when a metal bar he was lifting sort of landed into his lap several weeks ago. He reports he had about 3 weeks of discomfort before he saw his primary care doctor Dr. Juanetta GoslingHawkins. He states he was sent home with a muscle relaxer firm muscular soreness. He states that over that period of time symptoms have persisted. It's now been additional 3 weeks of taking muscle relaxers which have not seemed to help. He does not report out, but states that earlier he was having little bit of back pain several weeks ago.  He doesn't report of fall or injury other than the metal bar sort of landing in his lap. Denies abdominal pain, pelvic pain, urinary symptoms, testicular pain, penile pain, dysuria, bowel or bladder changes other than sometimes when the pain is really hard he states that he will have to pee.  He thinks the pain is in his upper thigh and points to his groin. Pain is intermittent and sometimes it's gone, it's worse when he stands up and is moving around for a long time and seems little bit worse.    Past Medical History:  Diagnosis Date  . Allergy   . Chronic headaches   . Diabetes mellitus without complication (HCC)   . GERD (gastroesophageal reflux disease)   . Heart burn   . Hyperlipidemia   . Hypertension     Patient Active Problem List   Diagnosis Date Noted  . Muscle strain 11/17/2015  . Low back pain 10/08/2015  . Hx of hematuria 07/16/2015  . Ganglion cyst of left foot 01/23/2015  . Smokeless tobacco use 01/12/2015  . Obesity, Class II, BMI 35-39.9 01/12/2015  . Diabetes (HCC) 10/16/2014  . Hypertension 10/16/2014  .  Dyslipidemia 10/16/2014  . Gout 10/16/2014  . GERD without esophagitis 10/16/2014  . Injury of tendon of upper extremity 05/29/2014  . Glenoid labral tear 05/29/2014  . Complete rotator cuff rupture of left shoulder 05/09/2014  . Infraspinatus tenosynovitis 05/09/2014  . Other synovitis and tenosynovitis, right shoulder 05/09/2014    Past Surgical History:  Procedure Laterality Date  . KNEE SURGERY    . SHOULDER SURGERY    . TONSILLECTOMY      Prior to Admission medications   Medication Sig Start Date End Date Taking? Authorizing Provider  allopurinol (ZYLOPRIM) 100 MG tablet Take 1 tablet (100 mg total) by mouth daily. 05/14/15   Janeann ForehandJames H Hawkins Jr., MD  amLODipine (NORVASC) 2.5 MG tablet Take 1 tablet (2.5 mg total) by mouth daily. 10/08/15   Janeann ForehandJames H Hawkins Jr., MD  baclofen (LIORESAL) 10 MG tablet Take 1 tablet (10 mg total) by mouth 3 (three) times daily. 11/17/15   Janeann ForehandJames H Hawkins Jr., MD  colchicine 0.6 MG tablet Take 1 tablet (0.6 mg total) by mouth as needed. 05/14/15   Janeann ForehandJames H Hawkins Jr., MD  fluticasone (FLONASE) 50 MCG/ACT nasal spray Place 2 sprays into both nostrils daily. 05/14/15   Janeann ForehandJames H Hawkins Jr., MD  glimepiride (AMARYL) 4 MG tablet Take 2 tablets each day. 10/08/15   Janeann ForehandJames H Hawkins Jr., MD  linagliptin (TRADJENTA) 5 MG TABS tablet Take 1 tablet (5 mg total) by mouth  daily. 05/14/15   [name]., MD  lisinopril (PRINIVIL,ZESTRIL) 40 MG tablet Take 1 tablet (40 mg total) by mouth daily. 05/14/15   [name]., MD  metaxalone (SKELAXIN) 800 MG tablet Take 1 tablet (800 mg total) by mouth 3 (three) times daily. Patient not taking: Reported on 11/17/2015 10/08/15   [name]., MD  metformin (FORTAMET) 1000 MG (OSM) 24 hr tablet Take 1 tablet (1,000 mg total) by mouth 2 (two) times daily with a meal. 05/14/15   [name]., MD  naproxen (NAPROSYN) 500 MG tablet Take 1 tablet (500 mg total) by mouth 2 (two) times daily with a meal. 11/17/15    [name]., MD  omeprazole (PRILOSEC) 20 MG capsule Take 1 capsule (20 mg total) by mouth daily. 05/14/15   [name]., MD    Allergies  Allergen Reactions  . Prednisone Other (See Comments)    Family History  Problem Relation Age of Onset  . Cancer Father     lung  . Heart disease Sister   . Heart disease Brother     Social History Social History  Substance Use Topics  . Smoking status: Former Smoker    Packs/day: 1.00    Years: 39.00  . Smokeless tobacco: Current User    Types: Chew  . Alcohol use No    Review of Systems  Constitutional: Negative for fever. Eyes: Negative for visual changes. ENT: Negative for sore throat. Cardiovascular: Negative for chest pain. Respiratory: Negative for shortness of breath. Gastrointestinal: Negative for abdominal pain, vomiting and diarrhea. Genitourinary: Negative for dysuria. Musculoskeletal: Negative for back painCurrently. Skin: Negative for rash. Neurological: Negative for headache. 10 point Review of Systems otherwise negative ____________________________________________   PHYSICAL EXAM:  VITAL SIGNS: ED Triage Vitals  Enc Vitals Group     BP 12/07/15 1328 130/78     Pulse Rate 12/07/15 1328 82     Resp 12/07/15 1328 14     Temp 12/07/15 1328 98.3 F (36.8 C)     Temp Source 12/07/15 1328 Oral     SpO2 12/07/15 1328 97 %     Weight 12/07/15 1328 261 lb (118.4 kg)     Height 12/07/15 1328 5\' 10"  (1.778 m)     Head Circumference --      Peak Flow --      Pain Score 12/07/15 1329 8     Pain Loc --      Pain Edu? --      Excl. in GC? --      Constitutional: Alert and oriented. Well appearing and in no distress. HEENT   Head: Normocephalic and atraumatic.      Eyes: Conjunctivae are normal. PERRL. Normal extraocular movements.      Ears:         Nose: No congestion/rhinnorhea.   Mouth/Throat: Mucous membranes are moist.   Neck: No stridor. Cardiovascular/Chest: Normal rate,  regular rhythm.  No murmurs, rubs, or gallops. Respiratory: Normal respiratory effort without tachypnea nor retractions. Breath sounds are clear and equal bilaterally. No wheezes/rales/rhonchi. Gastrointestinal: Soft. No distention, no guarding, no rebound. Nontender About the entire abdomen and pelvis to Su potential deep palpation. Genitourinary/rectal:Deferred Musculoskeletal: He reports some pain when he goes from sitting to standing and points to his right groin, just below the crease. No significant pain with range of motion of the hip passively, but actively he does get some discomfort and points to the internal  groin. Neurologic:  Normal speech and language. No gross or focal neurologic deficits are appreciated. Skin:  Skin is warm, dry and intact. No rash noted. Psychiatric: Mood and affect are normal. Speech and behavior are normal. Patient exhibits appropriate insight and judgment.   ____________________________________________  LABS (pertinent positives/negatives)  Labs Reviewed  COMPREHENSIVE METABOLIC PANEL - Abnormal; Notable for the following:       Result Value   Glucose, Bld 153 (*)    BUN 21 (*)    Creatinine, Ser 1.27 (*)    GFR calc non Af Amer 58 (*)    All other components within normal limits  URINALYSIS COMPLETEWITH MICROSCOPIC ([facility] ONLY) - Abnormal; Notable for the following:    Color, Urine YELLOW (*)    APPearance CLEAR (*)    Glucose, UA >500 (*)    Squamous Epithelial / LPF 0-5 (*)    All other components within normal limits  LIPASE, BLOOD  CBC    ____________________________________________    EKG I, Governor Rooks, MD, the attending physician have personally viewed and interpreted all ECGs.  None ____________________________________________  RADIOLOGY All Xrays were viewed by me. Imaging interpreted by Radiologist.  Ultrasound right lower extremity: No evidence of DVT  Right hip with pelvis x-ray: No acute osseous abnormality identified.  Normal for age radiographic appearance of the right hip and pelvis. __________________________________________  PROCEDURES  Procedure(s) performed: None  Critical Care performed: None  ____________________________________________   ED COURSE / ASSESSMENT AND PLAN  Pertinent labs & imaging results that were available during my care of the patient were reviewed by me and considered in my medical decision making (see chart for details).    Mr. hyppolite states he was referred to come here because he called to get a follow-up appointment with his primary care doctor and they were not available and so he was told to consider going to the emergency department. He is here because he has had persistence of intermittent right groin pain. Expanding the differential, I did recommend imaging of the right hip and pelvis for which there is no abnormality identified. I also ordered ultrasound to rule out DVT and this was negative.  Symptoms do not seem to be intra-abdominal. Symptoms do not seem to be genitourinary.  I still suspect that his symptoms are probably musculoskeletal in nature, consider pinched nerve, osteoarthritis, tendinitis or muscular inflammation. It sounds like he's been taking a muscle relaxer but not any anti-inflammatory. I did discuss with him careful use of ibuprofen given renal function is a little borderline, but I think it may help with his discomfort in healing this acute problem.    CONSULTATIONS:   None   Patient / Family / Caregiver informed of clinical course, medical decision-making process, and agree with plan.   I discussed return precautions, follow-up instructions, and discharge instructions with patient and/or family.   ___________________________________________   FINAL CLINICAL IMPRESSION(S) / ED DIAGNOSES   Final diagnoses:  Musculoskeletal pain of lower extremity, right              Note: This dictation was prepared with Dragon dictation.  Any transcriptional errors that result from this process are unintentional    Governor Rooks, MD 12/07/15 1716    Governor Rooks, MD 12/07/15 [phone]

## 2015-12-07 NOTE — Discharge Instructions (Signed)
You were evaluated for ongoing left thigh pain, and your exam and evaluation are reassuring today -- as we discussed I suspect inflammation related to nerve, muscle, tendon, or bone = musculoskeletal pain.  Try antiinflammatory pain medicine, over the counter ibuprofen 600mg  every 8 hours as needed for up to 1 week.  Drink plenty of water to help the kidneys.  Follow up with your primary care doctor.  Return to the emergency room for any worsening symptoms including testicular or penis pain, skin rash, pelvis or abdominal pain, bowel changes, back pain, leg swelling, numbness or tingling, or any other symptoms concerning to you.

## 2015-12-21 ENCOUNTER — Other Ambulatory Visit: Payer: Self-pay | Admitting: Family Medicine

## 2015-12-21 ENCOUNTER — Telehealth: Payer: Self-pay | Admitting: Family Medicine

## 2015-12-21 DIAGNOSIS — T148XXA Other injury of unspecified body region, initial encounter: Secondary | ICD-10-CM

## 2015-12-21 MED ORDER — NAPROXEN 500 MG PO TABS: 500.0000 mg | ORAL_TABLET | Freq: Two times a day (BID) | ORAL | 0 refills | Status: DC

## 2015-12-21 NOTE — Telephone Encounter (Signed)
This order has been sent.-jh

## 2015-12-21 NOTE — Telephone Encounter (Signed)
Wife  Called states that  Pt need a refill on   Naprosyn 500 mg  90 day supply [facility]  [facility] [address]  Call back # is  [phone]

## 2015-12-21 NOTE — Progress Notes (Signed)
Name: [name]   MRN: [id number]    DOB: Apr 05, 1950   Date:12/21/2015       Progress Note  Subjective  Chief Complaint  No chief complaint on file.   HPI  Phone call to refill Naproxyn No problem-specific Assessment & Plan notes found for this encounter.   Past Medical History:  Diagnosis Date  . Allergy   . Chronic headaches   . Diabetes mellitus without complication ([place])   . GERD (gastroesophageal reflux disease)   . Heart burn   . Hyperlipidemia   . Hypertension     Social History  Substance Use Topics  . Smoking status: Former Smoker    Packs/day: 1.00    Years: 39.00  . Smokeless tobacco: Current User    Types: Chew  . Alcohol use No     Current Outpatient Prescriptions:  .  allopurinol (ZYLOPRIM) 100 MG tablet, Take 1 tablet (100 mg total) by mouth daily., Disp: 90 tablet, Rfl: 3 .  amLODipine (NORVASC) 2.5 MG tablet, Take 1 tablet (2.5 mg total) by mouth daily., Disp: 90 tablet, Rfl: 3 .  baclofen (LIORESAL) 10 MG tablet, Take 1 tablet (10 mg total) by mouth 3 (three) times daily., Disp: 30 each, Rfl: 3 .  colchicine 0.6 MG tablet, Take 1 tablet (0.6 mg total) by mouth as needed., Disp: 90 tablet, Rfl: 3 .  fluticasone (FLONASE) 50 MCG/ACT nasal spray, Place 2 sprays into both nostrils daily., Disp: 16 g, Rfl: 12 .  glimepiride (AMARYL) 4 MG tablet, Take 2 tablets each day., Disp: 180 tablet, Rfl: 3 .  linagliptin (TRADJENTA) 5 MG TABS tablet, Take 1 tablet (5 mg total) by mouth daily., Disp: 90 tablet, Rfl: 3 .  lisinopril (PRINIVIL,ZESTRIL) 40 MG tablet, Take 1 tablet (40 mg total) by mouth daily., Disp: 90 tablet, Rfl: 3 .  metaxalone (SKELAXIN) 800 MG tablet, Take 1 tablet (800 mg total) by mouth 3 (three) times daily. (Patient not taking: Reported on 11/17/2015), Disp: 30 tablet, Rfl: 6 .  metformin (FORTAMET) 1000 MG (OSM) 24 hr tablet, Take 1 tablet (1,000 mg total) by mouth 2 (two) times daily with a meal., Disp: 180 tablet, Rfl: 3 .  naproxen  (NAPROSYN) 500 MG tablet, Take 1 tablet (500 mg total) by mouth 2 (two) times daily with a meal., Disp: 180 tablet, Rfl: 0 .  omeprazole (PRILOSEC) 20 MG capsule, Take 1 capsule (20 mg total) by mouth daily., Disp: 90 capsule, Rfl: 3  Allergies  Allergen Reactions  . Prednisone Other (See Comments)    ROS   Objective  There were no vitals filed for this visit.   Physical Exam    Recent Results (from the past 2160 hour(s))  POCT HgB A1C     Status: Abnormal   Collection Time: 10/08/15  2:40 PM  Result Value Ref Range   Hemoglobin A1C 9.7%   POCT Urinalysis Dipstick     Status: Abnormal   Collection Time: 10/08/15  2:45 PM  Result Value Ref Range   Color, UA neg    Clarity, UA clear    Glucose, UA neg    Bilirubin, UA neg    Ketones, UA neg    Spec Grav, UA 1.015    Blood, UA neg    pH, UA 6.0    Protein, UA 30    Urobilinogen, UA 0.2    Nitrite, UA neg    Leukocytes, UA Negative Negative  Lipase, blood     Status: None  Collection Time: 12/07/15  1:33 PM  Result Value Ref Range   Lipase 26 11 - 51 U/L  Comprehensive metabolic panel     Status: Abnormal   Collection Time: 12/07/15  1:33 PM  Result Value Ref Range   Sodium 136 135 - 145 mmol/L   Potassium 4.3 3.5 - 5.1 mmol/L   Chloride 103 101 - 111 mmol/L   CO2 26 22 - 32 mmol/L   Glucose, Bld 153 (H) 65 - 99 mg/dL   BUN 21 (H) 6 - 20 mg/dL   Creatinine, Ser 1.27 (H) 0.61 - 1.24 mg/dL   Calcium 9.6 8.9 - 10.3 mg/dL   Total Protein 8.0 6.5 - 8.1 g/dL   Albumin 4.2 3.5 - 5.0 g/dL   AST 27 15 - 41 U/L   ALT 25 17 - 63 U/L   Alkaline Phosphatase 47 38 - 126 U/L   Total Bilirubin 0.3 0.3 - 1.2 mg/dL   GFR calc non Af Amer 58 (L) >60 mL/min   GFR calc Af Amer >60 >60 mL/min    Comment: (NOTE) The eGFR has been calculated using the CKD EPI equation. This calculation has not been validated in all clinical situations. eGFR's persistently <60 mL/min signify possible Chronic Kidney Disease.    Anion gap 7 5 -  15  CBC     Status: None   Collection Time: 12/07/15  1:33 PM  Result Value Ref Range   WBC 8.1 3.8 - 10.6 K/uL   RBC 4.94 4.40 - 5.90 MIL/uL   Hemoglobin 14.5 13.0 - 18.0 g/dL   HCT 43.5 40.0 - 52.0 %   MCV 88.1 80.0 - 100.0 fL   MCH 29.3 26.0 - 34.0 pg   MCHC 33.3 32.0 - 36.0 g/dL   RDW 14.5 11.5 - 14.5 %   Platelets 260 150 - 440 K/uL  Urinalysis complete, with microscopic     Status: Abnormal   Collection Time: 12/07/15  1:33 PM  Result Value Ref Range   Color, Urine YELLOW (A) YELLOW   APPearance CLEAR (A) CLEAR   Glucose, UA >500 (A) NEGATIVE mg/dL   Bilirubin Urine NEGATIVE NEGATIVE   Ketones, ur NEGATIVE NEGATIVE mg/dL   Specific Gravity, Urine 1.020 1.005 - 1.030   Hgb urine dipstick NEGATIVE NEGATIVE   pH 5.0 5.0 - 8.0   Protein, ur NEGATIVE NEGATIVE mg/dL   Nitrite NEGATIVE NEGATIVE   Leukocytes, UA NEGATIVE NEGATIVE   RBC / HPF 0-5 0 - 5 RBC/hpf   WBC, UA 0-5 0 - 5 WBC/hpf   Bacteria, UA NONE SEEN NONE SEEN   Squamous Epithelial / LPF 0-5 (A) NONE SEEN   Mucous PRESENT      Assessment & Plan  1. Muscle strain  - naproxen (NAPROSYN) 500 MG tablet; Take 1 tablet (500 mg total) by mouth 2 (two) times daily with a meal.  Dispense: 180 tablet; Refill: 0

## 2016-01-06 ENCOUNTER — Encounter: Payer: Self-pay | Admitting: Dietician

## 2016-01-06 ENCOUNTER — Encounter: Payer: Commercial Indemnity | Attending: Family Medicine | Admitting: Dietician

## 2016-01-06 VITALS — Ht 71.0 in | Wt 269.0 lb

## 2016-01-06 DIAGNOSIS — E785 Hyperlipidemia, unspecified: Secondary | ICD-10-CM | POA: Diagnosis not present

## 2016-01-06 DIAGNOSIS — M109 Gout, unspecified: Secondary | ICD-10-CM | POA: Insufficient documentation

## 2016-01-06 DIAGNOSIS — Z713 Dietary counseling and surveillance: Secondary | ICD-10-CM | POA: Insufficient documentation

## 2016-01-06 DIAGNOSIS — E119 Type 2 diabetes mellitus without complications: Secondary | ICD-10-CM | POA: Diagnosis not present

## 2016-01-06 DIAGNOSIS — E6609 Other obesity due to excess calories: Secondary | ICD-10-CM

## 2016-01-06 DIAGNOSIS — K219 Gastro-esophageal reflux disease without esophagitis: Secondary | ICD-10-CM | POA: Insufficient documentation

## 2016-01-06 DIAGNOSIS — I10 Essential (primary) hypertension: Secondary | ICD-10-CM | POA: Insufficient documentation

## 2016-01-06 DIAGNOSIS — E669 Obesity, unspecified: Secondary | ICD-10-CM | POA: Insufficient documentation

## 2016-01-06 DIAGNOSIS — Z6837 Body mass index (BMI) 37.0-37.9, adult: Secondary | ICD-10-CM

## 2016-01-06 NOTE — Patient Instructions (Signed)
   Reduce food portions, especially of high calorie foods like fries, fried meats, processed meats (bologna, hot dogs, sausage, bacon),  biscuits. Try baked or grilled chicken or fish more often, whole wheat toast or English muffin in place of a biscuit.   Limit carbohydrate portions (starches, fruits, and milk). Keep to 3 or 4 servings with each meal, or 1 cup or less of the starches.   Add a vegetable with lunch meal and make sure to have one or more servings with your supper. You can also add some for snacks, such as carrot or celery sticks, cucumber, sliced peppers. These foods help to improve blood pressure, blood sugar, and help prevent gout and help with weight loss.   Have a fruit with a meal or as part of a snack one or two times a day. They also help in the same ways as vegetables.

## 2016-01-06 NOTE — Progress Notes (Signed)
Medical Nutrition Therapy: Visit start time: 1630  end time: 1730  Assessment:  Diagnosis: obesity Past medical history: Diabetes, HTN, HLD, GERD, gout Psychosocial issues/ stress concerns: none Preferred learning method:  . Auditory  Current weight: 269.0lbs with shoes  Height: 5'11" Medications, supplements: reviewed list in chart with patient  Progress and evaluation: Patient reports losing about 60-70lbs in past 1-2 years by decreasing food portions and increased walking. He increased food portions again, and decreased activity since his wife had surgery several weeks ago, now weight has begun to increase once again as well. 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 Dining out frequency: 10-13 meals per week.  Breakfast: sausage egg and cheese biscuit, diet soda Snack: sometimes cookies/ vanilla wafers before breakfast (to take meds), none recently Lunch:  hot dog(s) or chicken tenders with 1/2 order fries Snack: candy or cookies Supper: 2 bologna sandwiches; or wife cooks. Snack: cookies or ice cream sandwich Beverages: water, sugar free sodas 2-3 daily  Nutrition Care Education: Topics covered: weight control for health issues Basic nutrition: basic food groups, appropriate nutrient balance, appropriate meal and snack schedule    Weight control: benefits of weight control, determining reasonable weight loss rate, portion control -- appropriate portions of carbohydrate foods, using food models and plate method to illustrate food portions and balanced meals. Discussed importance of low fat foods and low sugar. Advised 3-4 servings of carbohydrate foods with meals along with small-moderate portions of meats. Advised larger portions of vegetables.  Diabetes:  appropriate carb intake and balance, sick day  guidelines Other:  importance of vegetables and fruits in controlling chronic health issues as well as inflammation. Discussed ways to include more vegetables and fruits.   Nutritional Diagnosis:  [place]-3.3 Overweight/obesity As related to excess calories.  As evidenced by patient report.  Intervention: Instruction as noted above.   Set goals with patient input.    Encouraged focus on positive changes such as adding vegetables.    Patient declined scheduling follow-up, states he will call and schedule later if needed.  Education Materials given:  . Food lists/ Planning A Balanced Meal . Plate method meal planner . Goals/ instructions  Learner/ who was taught:  . Patient  . Spouse  Level of understanding: Marland Kitchen. Verbalizes/ demonstrates competency  Demonstrated degree of understanding via:   Teach back Learning barriers: . None  Willingness to learn/ readiness for change: . Acceptance, ready for change  Monitoring and Evaluation:  Dietary intake, exercise, BG, HTN, HLD control, and body weight      follow up: prn

## 2016-01-21 ENCOUNTER — Ambulatory Visit (INDEPENDENT_AMBULATORY_CARE_PROVIDER_SITE_OTHER): Payer: Commercial Indemnity | Admitting: Family Medicine

## 2016-01-21 ENCOUNTER — Encounter: Payer: Self-pay | Admitting: Family Medicine

## 2016-01-21 VITALS — BP 140/70 | HR 71 | Temp 98.3°F | Resp 16 | Ht 70.0 in | Wt 264.0 lb

## 2016-01-21 DIAGNOSIS — E119 Type 2 diabetes mellitus without complications: Secondary | ICD-10-CM

## 2016-01-21 DIAGNOSIS — I10 Essential (primary) hypertension: Secondary | ICD-10-CM | POA: Diagnosis not present

## 2016-01-21 DIAGNOSIS — E785 Hyperlipidemia, unspecified: Secondary | ICD-10-CM | POA: Diagnosis not present

## 2016-01-21 DIAGNOSIS — E669 Obesity, unspecified: Secondary | ICD-10-CM | POA: Diagnosis not present

## 2016-01-21 DIAGNOSIS — E0800 Diabetes mellitus due to underlying condition with hyperosmolarity without nonketotic hyperglycemic-hyperosmolar coma (NKHHC): Secondary | ICD-10-CM | POA: Diagnosis not present

## 2016-01-21 DIAGNOSIS — K219 Gastro-esophageal reflux disease without esophagitis: Secondary | ICD-10-CM

## 2016-01-21 LAB — POCT GLYCOSYLATED HEMOGLOBIN (HGB A1C): Hemoglobin A1C: 8.3

## 2016-01-21 NOTE — Patient Instructions (Signed)
Patient still declines flu shot.

## 2016-01-21 NOTE — Progress Notes (Signed)
Name: [name]   MRN: [id number]    DOB: January 02, 1951   Date:01/21/2016       Progress Note  Subjective  Chief Complaint  Chief Complaint  Patient presents with  . Diabetes    HPI Here for f/u of DM and HBP.  He has not lost any weight, but his A1c is down to 8.3.  He is taking all of his meds, but does not knows the names of them.  No problem-specific Assessment & Plan notes found for this encounter.   Past Medical History:  Diagnosis Date  . Allergy   . Chronic headaches   . Diabetes mellitus without complication ([place])   . GERD (gastroesophageal reflux disease)   . Heart burn   . Hyperlipidemia   . Hypertension     Past Surgical History:  Procedure Laterality Date  . KNEE SURGERY    . SHOULDER SURGERY    . TONSILLECTOMY      Family History  Problem Relation Age of Onset  . Cancer Father     lung  . Heart disease Sister   . Heart disease Brother     Social History   Social History  . Marital status: Married    Spouse name: N/A  . Number of children: N/A  . Years of education: N/A   Occupational History  . Not on file.   Social History Main Topics  . Smoking status: Former Smoker    Packs/day: 1.00    Years: 39.00  . Smokeless tobacco: Current User    Types: Chew     Comment: wife states patient never smoked cigarettes, just chewing tobacco  . Alcohol use No  . Drug use: No  . Sexual activity: Yes    Birth control/ protection: Inserts   Other Topics Concern  . Not on file   Social History Narrative  . No narrative on file     Current Outpatient Prescriptions:  .  allopurinol (ZYLOPRIM) 100 MG tablet, Take 1 tablet (100 mg total) by mouth daily., Disp: 90 tablet, Rfl: 3 .  amLODipine (NORVASC) 2.5 MG tablet, Take 1 tablet (2.5 mg total) by mouth daily., Disp: 90 tablet, Rfl: 3 .  baclofen (LIORESAL) 10 MG tablet, Take 1 tablet (10 mg total) by mouth 3 (three) times daily. (Patient taking differently: Take 10 mg by mouth 3 (three)  times daily as needed. ), Disp: 30 each, Rfl: 3 .  colchicine 0.6 MG tablet, Take 1 tablet (0.6 mg total) by mouth as needed., Disp: 90 tablet, Rfl: 3 .  fluticasone (FLONASE) 50 MCG/ACT nasal spray, Place 2 sprays into both nostrils daily., Disp: 16 g, Rfl: 12 .  glimepiride (AMARYL) 4 MG tablet, Take 2 tablets each day., Disp: 180 tablet, Rfl: 3 .  linagliptin (TRADJENTA) 5 MG TABS tablet, Take 1 tablet (5 mg total) by mouth daily., Disp: 90 tablet, Rfl: 3 .  lisinopril (PRINIVIL,ZESTRIL) 40 MG tablet, Take 1 tablet (40 mg total) by mouth daily., Disp: 90 tablet, Rfl: 3 .  metformin (FORTAMET) 1000 MG (OSM) 24 hr tablet, Take 1 tablet (1,000 mg total) by mouth 2 (two) times daily with a meal., Disp: 180 tablet, Rfl: 3 .  naproxen (NAPROSYN) 500 MG tablet, Take 1 tablet (500 mg total) by mouth 2 (two) times daily with a meal., Disp: 180 tablet, Rfl: 0 .  omeprazole (PRILOSEC) 20 MG capsule, Take 1 capsule (20 mg total) by mouth daily., Disp: 90 capsule, Rfl: 3 .  metaxalone (SKELAXIN) 800  MG tablet, Take 1 tablet (800 mg total) by mouth 3 (three) times daily. (Patient not taking: Reported on 01/21/2016), Disp: 30 tablet, Rfl: 6  Allergies  Allergen Reactions  . Prednisone Other (See Comments)     Review of Systems  Constitutional: Negative for chills, fever, malaise/fatigue and weight loss.  HENT: Negative for hearing loss.   Eyes: Negative for blurred vision and double vision.  Respiratory: Negative for cough, shortness of breath and wheezing.   Cardiovascular: Negative for chest pain, palpitations and leg swelling.  Gastrointestinal: Negative for abdominal pain, blood in stool and heartburn.  Genitourinary: Negative for dysuria, frequency and urgency.  Musculoskeletal: Negative for joint pain and myalgias.  Skin: Negative for rash.  Neurological: Negative for dizziness, tremors, weakness and headaches.      Objective  Vitals:   01/21/16 0832 01/21/16 0854  BP: (!) 166/81 140/70   Pulse: 71   Resp: 16   Temp: 98.3 F (36.8 C)   TempSrc: Oral   Weight: 264 lb (119.7 kg)   Height: '5\' 10"'  (1.778 m)     Physical Exam  Constitutional: He is oriented to person, place, and time and well-developed, well-nourished, and in no distress. No distress.  HENT:  Head: Normocephalic and atraumatic.  Eyes: Conjunctivae and EOM are normal. Pupils are equal, round, and reactive to light. No scleral icterus.  Neck: Normal range of motion. Neck supple. Carotid bruit is not present. No thyromegaly present.  Cardiovascular: Normal rate, regular rhythm and normal heart sounds.  Exam reveals no gallop and no friction rub.   No murmur heard. Pulmonary/Chest: Effort normal and breath sounds normal. No respiratory distress. He has no wheezes. He has no rales.  Abdominal: Soft. Bowel sounds are normal. He exhibits no distension and no mass. There is no tenderness.  Musculoskeletal: He exhibits no edema.  Lymphadenopathy:    He has no cervical adenopathy.  Neurological: He is alert and oriented to person, place, and time.  Vitals reviewed.      Recent Results (from the past 2160 hour(s))  Lipase, blood     Status: None   Collection Time: 12/07/15  1:33 PM  Result Value Ref Range   Lipase 26 11 - 51 U/L  Comprehensive metabolic panel     Status: Abnormal   Collection Time: 12/07/15  1:33 PM  Result Value Ref Range   Sodium 136 135 - 145 mmol/L   Potassium 4.3 3.5 - 5.1 mmol/L   Chloride 103 101 - 111 mmol/L   CO2 26 22 - 32 mmol/L   Glucose, Bld 153 (H) 65 - 99 mg/dL   BUN 21 (H) 6 - 20 mg/dL   Creatinine, Ser 1.27 (H) 0.61 - 1.24 mg/dL   Calcium 9.6 8.9 - 10.3 mg/dL   Total Protein 8.0 6.5 - 8.1 g/dL   Albumin 4.2 3.5 - 5.0 g/dL   AST 27 15 - 41 U/L   ALT 25 17 - 63 U/L   Alkaline Phosphatase 47 38 - 126 U/L   Total Bilirubin 0.3 0.3 - 1.2 mg/dL   GFR calc non Af Amer 58 (L) >60 mL/min   GFR calc Af Amer >60 >60 mL/min    Comment: (NOTE) The eGFR has been calculated  using the CKD EPI equation. This calculation has not been validated in all clinical situations. eGFR's persistently <60 mL/min signify possible Chronic Kidney Disease.    Anion gap 7 5 - 15  CBC     Status: None   Collection  Time: 12/07/15  1:33 PM  Result Value Ref Range   WBC 8.1 3.8 - 10.6 K/uL   RBC 4.94 4.40 - 5.90 MIL/uL   Hemoglobin 14.5 13.0 - 18.0 g/dL   HCT 43.5 40.0 - 52.0 %   MCV 88.1 80.0 - 100.0 fL   MCH 29.3 26.0 - 34.0 pg   MCHC 33.3 32.0 - 36.0 g/dL   RDW 14.5 11.5 - 14.5 %   Platelets 260 150 - 440 K/uL  Urinalysis complete, with microscopic     Status: Abnormal   Collection Time: 12/07/15  1:33 PM  Result Value Ref Range   Color, Urine YELLOW (A) YELLOW   APPearance CLEAR (A) CLEAR   Glucose, UA >500 (A) NEGATIVE mg/dL   Bilirubin Urine NEGATIVE NEGATIVE   Ketones, ur NEGATIVE NEGATIVE mg/dL   Specific Gravity, Urine 1.020 1.005 - 1.030   Hgb urine dipstick NEGATIVE NEGATIVE   pH 5.0 5.0 - 8.0   Protein, ur NEGATIVE NEGATIVE mg/dL   Nitrite NEGATIVE NEGATIVE   Leukocytes, UA NEGATIVE NEGATIVE   RBC / HPF 0-5 0 - 5 RBC/hpf   WBC, UA 0-5 0 - 5 WBC/hpf   Bacteria, UA NONE SEEN NONE SEEN   Squamous Epithelial / LPF 0-5 (A) NONE SEEN   Mucous PRESENT   POCT HgB A1C     Status: Abnormal   Collection Time: 01/21/16  8:38 AM  Result Value Ref Range   Hemoglobin A1C 8.3%      Assessment & Plan  Problem List Items Addressed This Visit      Cardiovascular and Mediastinum   Hypertension     Digestive   GERD without esophagitis     Endocrine   Diabetes ([place])     Other   Dyslipidemia   Obesity, Class II, BMI 35-39.9    Other Visit Diagnoses    Diabetes mellitus without complication ([place])    -  Primary   Relevant Orders   POCT HgB A1C (Completed)      No orders of the defined types were placed in this encounter.  1. Diabetes mellitus without complication ([place])  - POCT HgB A1C-8.3  2. Diabetes mellitus due to underlying condition with  hyperosmolarity without coma, without long-term current use of insulin ([facility]) Cont meds. Discussed more attention to dietary restriction and weight loss.  3. Essential hypertension Cont meds 4. GERD without esophagitis Cont meds  5. Dyslipidemia   6. Obesity, Class II, BMI 35-39.9 Discussed more weight loss.

## 2016-04-06 ENCOUNTER — Ambulatory Visit (INDEPENDENT_AMBULATORY_CARE_PROVIDER_SITE_OTHER): Payer: Commercial Indemnity | Admitting: Family Medicine

## 2016-04-06 ENCOUNTER — Encounter: Payer: Self-pay | Admitting: Family Medicine

## 2016-04-06 VITALS — BP 143/77 | HR 92 | Temp 99.3°F | Resp 16 | Ht 70.0 in | Wt 255.6 lb

## 2016-04-06 DIAGNOSIS — J019 Acute sinusitis, unspecified: Secondary | ICD-10-CM

## 2016-04-06 DIAGNOSIS — J209 Acute bronchitis, unspecified: Secondary | ICD-10-CM

## 2016-04-06 MED ORDER — LEVOFLOXACIN 500 MG PO TABS: 500.0000 mg | ORAL_TABLET | Freq: Every day | ORAL | 0 refills | Status: DC

## 2016-04-06 MED ORDER — IPRATROPIUM BROMIDE 0.06 % NA SOLN: 2.0000 | Freq: Four times a day (QID) | NASAL | 0 refills | Status: DC

## 2016-04-06 NOTE — Patient Instructions (Signed)
Thank you for coming in to clinic today.  1. It sounds like you had an Upper Respiratory Virus that has settled into a Bronchitis, lower respiratory tract infection. I don't have concerns for pneumonia today, and think that this should gradually improve. Once you are feeling better, the cough may take a few weeks to fully resolve. I do hear wheezing and coarse breath sounds, this may be due to the virus, also could be related to second hand smoking. - Start Levaquin antibiotic 500mg  daily for 7 days - Start Atrovent nasal spray 2 sprays in each nostril up to max 3-4 times a day up to 3-4 days for nasal congestion, if you use for longer than this it may no longer be effective, stop using at 1 week. - Start OTC Mucinex-DM for 1 week or less, to help clear the mucus - May resume Flonase nose spray when feeling better after finished Atrovent, 2 sprays in each nostril for about 4-6 weeks - Use nasal saline (Simply Saline or Ocean Spray) to flush nasal congestion multiple times a day, may help cough - Drink plenty of fluids to improve congestion  Please schedule a follow-up appointment with Dr. Althea CharonKaramalegos or Dr Juanetta GoslingHawkins within 2 weeks if not improved bronchitis / sinusitis  If you have any other questions or concerns, please feel free to call the clinic or send a message through MyChart. You may also schedule an earlier appointment if necessary.  Saralyn PilarAlexander Ellee Wawrzyniak, DO [facility]

## 2016-04-06 NOTE — Progress Notes (Signed)
Subjective:    Patient ID: [name], male    DOB: February 19, 1951, 67 y.o.   MRN: [id number]  [name] is a 66 y.o. male presenting on 04/06/2016 for Nasal Congestion (runny nose HA no chills or fever but has cough onset 10 days)   HPI   SINUSITIS / BRONCHITIS - Reports recent symptoms about 10 days ago with URI cough congestion, persisted for several days, tried Motrin-D and NyQuil without relief. Also sick contact with wife at home however her illness started more recently with other symptoms. He attempted to go back to work yesterday but felt feverish and had difficulty, now worsening past 2 days or so with more frequent coughing spells, somewhat productive but has frequent dry heaving as he can't cough up the mucus. Never smoker, chronic history of chewing tobacco, but wife is chronic smoker so he is around 2nd hand smoke. - Unable to take prednisone due to allergy with chest tightness and rash - Admits subjective fever, fatigue, previously had some myalgias early on (since resolved) - Denies chills, nausea, vomiting, abdominal pain, dyspnea  Social History  Substance Use Topics  . Smoking status: Passive Smoke Exposure - Never Smoker    Packs/day: 1.00    Years: 39.00  . Smokeless tobacco: Current User    Types: Chew     Comment: wife states patient never smoked cigarettes, just chewing tobacco  . Alcohol use No    Review of Systems Per HPI unless specifically indicated above     Objective:    BP (!) 143/77   Pulse 92   Temp 99.3 F (37.4 C) (Oral)   Resp 16   Ht 5\' 10"  (1.778 m)   Wt 255 lb 9.6 oz (115.9 kg)   SpO2 96%   BMI 36.67 kg/m   Wt Readings from Last 3 Encounters:  04/06/16 255 lb 9.6 oz (115.9 kg)  01/21/16 264 lb (119.7 kg)  01/06/16 269 lb (122 kg)    Physical Exam  Constitutional: He appears well-developed and well-nourished. No distress.  Mildly ill appearing but non toxic, comfortable, cooperative  HENT:  Head: Normocephalic and  atraumatic.  Minimal tenderness frontal sinuses, maxillary non tender. Nares patent with some congestion without purulence or edema. Bilateral TMs clear with mild chronic scarring bilateral without significant erythema, effusion or bulging. Oropharynx clear without erythema, exudates, edema or asymmetry. Moist mucus mem  Eyes: Conjunctivae are normal. Right eye exhibits no discharge. Left eye exhibits no discharge.  Neck: Normal range of motion. Neck supple.  Cardiovascular: Normal rate, regular rhythm, normal heart sounds and intact distal pulses.   No murmur heard. Pulmonary/Chest: Effort normal. No respiratory distress. He has no wheezes.  Mildly reduced air movement diffusely, some generalized coarse breath sounds without overt wheezing. No focal crackles. Some transmitted upper airway sounds.  Musculoskeletal: He exhibits no edema.  Lymphadenopathy:    He has no cervical adenopathy.  Neurological: He is alert.  Skin: Skin is warm and dry. No rash noted. He is not diaphoretic.  Psychiatric: His behavior is normal.  Nursing note and vitals reviewed.  Results for orders placed or performed in visit on 01/21/16  POCT HgB A1C  Result Value Ref Range   Hemoglobin A1C 8.3%       Assessment & Plan:   Problem List Items Addressed This Visit    None    Visit Diagnoses    Acute bronchitis, unspecified organism    -  Primary  Consistent with worsening bronchitis  in setting of likely viral URI (+sick contacts), concern with duration >1 week. Low grade temp without fever, no focal evidence of pneumonia, 96% on RA, questionable possible may have had flu 1 week ago with myalgias and viral syndrome since resolved, Mild tight / coarse breath sounds on exam concern for some bronchospasm (without history of COPD, but passive 2nd hand smoker)  Plan: 1. Start Levaquin antibiotic 500mg  daily x 7 days (he declined initial Z-pak has not helped him in past) 2. Start Atrovent nasal spray decongestant -  resume Flonase after finished for continued use 3. Unfortunately cannot use prednisone given serious allergy, also may benefit from albuterol but has never used, not interested today 4. Recommend trial OTC - Mucinex, Tylenol/Ibuprofen PRN, Nasal saline, lozenges, tea with honey/lemon 5. Return criteria reviewed, follow-up within 1 week if not improved, consider CXR as discussed      Relevant Medications   levofloxacin (LEVAQUIN) 500 MG tablet   Acute rhinosinusitis     - Initial symptoms likely URI, now with sinus drainage, possibly etiology for bronchitis - Covered with bronchitis treatment    Relevant Medications   levofloxacin (LEVAQUIN) 500 MG tablet   ipratropium (ATROVENT) 0.06 % nasal spray      Meds ordered this encounter  Medications  . levofloxacin (LEVAQUIN) 500 MG tablet    Sig: Take 1 tablet (500 mg total) by mouth daily. For 7 days    Dispense:  7 tablet    Refill:  0  . ipratropium (ATROVENT) 0.06 % nasal spray    Sig: Place 2 sprays into both nostrils 4 (four) times daily. For up to 5-7 days then stop.    Dispense:  15 mL    Refill:  0      Follow up plan: Return in about 2 weeks (around 04/20/2016), or if symptoms worsen or fail to improve, for sinusitis / bronchitis.  Saralyn PilarAlexander Karamalegos, DO [facility] 04/06/2016, 9:40 PM

## 2016-04-26 ENCOUNTER — Encounter: Payer: Self-pay | Admitting: Family Medicine

## 2016-04-26 ENCOUNTER — Ambulatory Visit (INDEPENDENT_AMBULATORY_CARE_PROVIDER_SITE_OTHER): Payer: Commercial Indemnity | Admitting: Family Medicine

## 2016-04-26 VITALS — BP 139/64 | HR 67 | Temp 98.6°F | Ht 70.0 in | Wt 262.0 lb

## 2016-04-26 DIAGNOSIS — E119 Type 2 diabetes mellitus without complications: Secondary | ICD-10-CM

## 2016-04-26 DIAGNOSIS — Z794 Long term (current) use of insulin: Secondary | ICD-10-CM | POA: Diagnosis not present

## 2016-04-26 DIAGNOSIS — E0800 Diabetes mellitus due to underlying condition with hyperosmolarity without nonketotic hyperglycemic-hyperosmolar coma (NKHHC): Secondary | ICD-10-CM

## 2016-04-26 DIAGNOSIS — E785 Hyperlipidemia, unspecified: Secondary | ICD-10-CM

## 2016-04-26 DIAGNOSIS — E669 Obesity, unspecified: Secondary | ICD-10-CM | POA: Diagnosis not present

## 2016-04-26 DIAGNOSIS — I10 Essential (primary) hypertension: Secondary | ICD-10-CM

## 2016-04-26 LAB — POCT GLYCOSYLATED HEMOGLOBIN (HGB A1C): Hemoglobin A1C: 8.8

## 2016-04-26 MED ORDER — INSULIN GLARGINE 100 UNIT/ML SOLOSTAR PEN: 10.0000 [IU] | PEN_INJECTOR | Freq: Every day | SUBCUTANEOUS | 11 refills | Status: DC

## 2016-04-26 MED ORDER — PEN NEEDLES 30G X 8 MM MISC: 1.0000 "application " | Freq: Every day | 12 refills | Status: DC

## 2016-04-26 NOTE — Progress Notes (Signed)
Name: [name]   MRN: [id number]    DOB: Aug 07, 1950   Date:04/26/2016       Progress Note  Subjective  Chief Complaint  Chief Complaint  Patient presents with  . Diabetes  . Hypertension    HPI Here for f/u of DM and HBP.  He has decreased his Amaryl to 4 mg, 1 daily.  Said hew felt nervous and jittery so he cut it back.  He has not checked any BSs, esp when he feels nervous.  He has not followed any diet and does not check BSs except rarely.  He got badly dehydrated with Invokanna in past.  No problem-specific Assessment & Plan notes found for this encounter.   Past Medical History:  Diagnosis Date  . Allergy   . Chronic headaches   . Diabetes mellitus without complication (HCC)   . GERD (gastroesophageal reflux disease)   . Heart burn   . Hyperlipidemia   . Hypertension     Past Surgical History:  Procedure Laterality Date  . KNEE SURGERY    . SHOULDER SURGERY    . TONSILLECTOMY      Family History  Problem Relation Age of Onset  . Cancer Father     lung  . Heart disease Sister   . Heart disease Brother     Social History   Social History  . Marital status: Married    Spouse name: N/A  . Number of children: N/A  . Years of education: N/A   Occupational History  . Not on file.   Social History Main Topics  . Smoking status: Passive Smoke Exposure - Never Smoker    Packs/day: 1.00    Years: 39.00  . Smokeless tobacco: Current User    Types: Chew     Comment: wife states patient never smoked cigarettes, just chewing tobacco  . Alcohol use No  . Drug use: No  . Sexual activity: Yes    Birth control/ protection: Inserts   Other Topics Concern  . Not on file   Social History Narrative  . No narrative on file     Current Outpatient Prescriptions:  .  allopurinol (ZYLOPRIM) 100 MG tablet, Take 1 tablet (100 mg total) by mouth daily., Disp: 90 tablet, Rfl: 3 .  amLODipine (NORVASC) 2.5 MG tablet, Take 1 tablet (2.5 mg total) by mouth  daily., Disp: 90 tablet, Rfl: 3 .  baclofen (LIORESAL) 10 MG tablet, Take 1 tablet (10 mg total) by mouth 3 (three) times daily. (Patient taking differently: Take 10 mg by mouth 3 (three) times daily as needed. ), Disp: 30 each, Rfl: 3 .  colchicine 0.6 MG tablet, Take 1 tablet (0.6 mg total) by mouth as needed., Disp: 90 tablet, Rfl: 3 .  fluticasone (FLONASE) 50 MCG/ACT nasal spray, Place 2 sprays into both nostrils daily., Disp: 16 g, Rfl: 12 .  linagliptin (TRADJENTA) 5 MG TABS tablet, Take 1 tablet (5 mg total) by mouth daily., Disp: 90 tablet, Rfl: 3 .  lisinopril (PRINIVIL,ZESTRIL) 40 MG tablet, Take 1 tablet (40 mg total) by mouth daily., Disp: 90 tablet, Rfl: 3 .  metformin (FORTAMET) 1000 MG (OSM) 24 hr tablet, Take 1 tablet (1,000 mg total) by mouth 2 (two) times daily with a meal., Disp: 180 tablet, Rfl: 3 .  naproxen (NAPROSYN) 500 MG tablet, Take 1 tablet (500 mg total) by mouth 2 (two) times daily with a meal., Disp: 180 tablet, Rfl: 0 .  omeprazole (PRILOSEC) 20 MG capsule, Take  1 capsule (20 mg total) by mouth daily., Disp: 90 capsule, Rfl: 3 .  Insulin Glargine (LANTUS SOLOSTAR) 100 UNIT/ML Solostar Pen, Inject 10 Units into the skin daily at 10 pm., Disp: 3 pen, Rfl: 11 .  Insulin Pen Needle (PEN NEEDLES) 30G X 8 MM MISC, Inject 1 application into the skin daily at 10 pm., Disp: 100 each, Rfl: 12 .  metaxalone (SKELAXIN) 800 MG tablet, Take 1 tablet (800 mg total) by mouth 3 (three) times daily. (Patient not taking: Reported on 04/26/2016), Disp: 30 tablet, Rfl: 6  Allergies  Allergen Reactions  . Prednisone Other (See Comments)     Review of Systems  Constitutional: Negative for chills, fever, malaise/fatigue and weight loss.  HENT: Negative for hearing loss and tinnitus.   Eyes: Negative for blurred vision and double vision.  Respiratory: Negative for cough, shortness of breath and wheezing.   Cardiovascular: Negative for chest pain, palpitations and leg swelling.   Gastrointestinal: Negative for abdominal pain, blood in stool and heartburn.  Genitourinary: Negative for dysuria, frequency and urgency.  Musculoskeletal: Negative for back pain and myalgias.  Skin: Negative for rash.  Neurological: Negative for dizziness, tingling, tremors, weakness and headaches.      Objective  Vitals:   04/26/16 0903  BP: 139/64  Pulse: 67  Temp: 98.6 F (37 C)  TempSrc: Oral  Weight: 262 lb (118.8 kg)  Height: 5\' 10"  (1.778 m)    Physical Exam  Constitutional: He is oriented to person, place, and time and well-developed, well-nourished, and in no distress. No distress.  HENT:  Head: Normocephalic and atraumatic.  Eyes: Conjunctivae and EOM are normal. Pupils are equal, round, and reactive to light. No scleral icterus.  Neck: Normal range of motion. Neck supple. Carotid bruit is not present. No thyromegaly present.  Cardiovascular: Normal rate, regular rhythm and normal heart sounds.  Exam reveals no gallop and no friction rub.   No murmur heard. Pulmonary/Chest: Effort normal and breath sounds normal. No respiratory distress. He has no wheezes. He has no rales.  Abdominal: Soft. Bowel sounds are normal. He exhibits no distension and no mass. There is no tenderness.  Musculoskeletal: He exhibits no edema.  Lymphadenopathy:    He has no cervical adenopathy.  Neurological: He is alert and oriented to person, place, and time.  Vitals reviewed.      Recent Results (from the past 2160 hour(s))  POCT HgB A1C     Status: Abnormal   Collection Time: 04/26/16  9:38 AM  Result Value Ref Range   Hemoglobin A1C 8.8%      Assessment & Plan  Problem List Items Addressed This Visit      Cardiovascular and Mediastinum   Hypertension     Endocrine   Diabetes (HCC)   Relevant Medications   Insulin Glargine (LANTUS SOLOSTAR) 100 UNIT/ML Solostar Pen     Other   Dyslipidemia   Obesity, Class II, BMI 35-39.9   Relevant Medications   Insulin  Glargine (LANTUS SOLOSTAR) 100 UNIT/ML Solostar Pen    Other Visit Diagnoses    Diabetes mellitus without complication (HCC)    -  Primary   Relevant Medications   Insulin Glargine (LANTUS SOLOSTAR) 100 UNIT/ML Solostar Pen   Insulin Pen Needle (PEN NEEDLES) 30G X 8 MM MISC   Other Relevant Orders   POCT HgB A1C (Completed)      Meds ordered this encounter  Medications  . Insulin Glargine (LANTUS SOLOSTAR) 100 UNIT/ML Solostar Pen  Sig: Inject 10 Units into the skin daily at 10 pm.    Dispense:  3 pen    Refill:  11  . Insulin Pen Needle (PEN NEEDLES) 30G X 8 MM MISC    Sig: Inject 1 application into the skin daily at 10 pm.    Dispense:  100 each    Refill:  12   1. Diabetes mellitus without complication (HCC) Stop Amaryl - POCT HgB A1C -8.8 - Insulin Glargine (LANTUS SOLOSTAR) 100 UNIT/ML Solostar Pen; Inject 10 Units into the skin daily at 10 pm.  Dispense: 3 pen; Refill: 11 - Insulin Pen Needle (PEN NEEDLES) 30G X 8 MM MISC; Inject 1 application into the skin daily at 10 pm.  Dispense: 100 each; Refill: 12 Cont other meds 2. Diabetes mellitus due to underlying condition with hyperosmolarity without coma, with long-term current use of insulin (HCC)   3. Essential hypertension Cont meds  4. Dyslipidemia cont meds  5. Obesity, Class II, BMI 35-39.9

## 2016-04-26 NOTE — Patient Instructions (Signed)
Call blood sugars to Sonya (nurse) in 1 week

## 2016-05-05 ENCOUNTER — Telehealth: Payer: Self-pay | Admitting: *Deleted

## 2016-05-05 NOTE — Telephone Encounter (Signed)
After Dr. Juanetta GoslingHawkins reviewed blood sugars patient contacted and told to make no changes.[name]

## 2016-05-23 ENCOUNTER — Other Ambulatory Visit: Payer: Self-pay | Admitting: Family Medicine

## 2016-05-23 DIAGNOSIS — E119 Type 2 diabetes mellitus without complications: Secondary | ICD-10-CM

## 2016-05-24 ENCOUNTER — Other Ambulatory Visit: Payer: Self-pay | Admitting: Family Medicine

## 2016-05-24 ENCOUNTER — Telehealth: Payer: Self-pay | Admitting: Family Medicine

## 2016-05-24 DIAGNOSIS — E119 Type 2 diabetes mellitus without complications: Secondary | ICD-10-CM

## 2016-05-24 DIAGNOSIS — M1000 Idiopathic gout, unspecified site: Secondary | ICD-10-CM

## 2016-05-24 DIAGNOSIS — K219 Gastro-esophageal reflux disease without esophagitis: Secondary | ICD-10-CM

## 2016-05-24 DIAGNOSIS — I10 Essential (primary) hypertension: Secondary | ICD-10-CM

## 2016-05-24 MED ORDER — LISINOPRIL 40 MG PO TABS: 40.0000 mg | ORAL_TABLET | Freq: Every day | ORAL | 3 refills | Status: DC

## 2016-05-24 MED ORDER — METFORMIN HCL ER (OSM) 1000 MG PO TB24: ORAL_TABLET | ORAL | 3 refills | Status: DC

## 2016-05-24 MED ORDER — AMLODIPINE BESYLATE 2.5 MG PO TABS: 2.5000 mg | ORAL_TABLET | Freq: Every day | ORAL | 3 refills | Status: DC

## 2016-05-24 MED ORDER — LINAGLIPTIN 5 MG PO TABS: 5.0000 mg | ORAL_TABLET | Freq: Every day | ORAL | 3 refills | Status: DC

## 2016-05-24 MED ORDER — ALLOPURINOL 100 MG PO TABS: 100.0000 mg | ORAL_TABLET | Freq: Every day | ORAL | 3 refills | Status: DC

## 2016-05-24 MED ORDER — OMEPRAZOLE 20 MG PO CPDR: 20.0000 mg | DELAYED_RELEASE_CAPSULE | Freq: Every day | ORAL | 3 refills | Status: DC

## 2016-05-24 NOTE — Telephone Encounter (Signed)
Pt needs refills on allopurinol, metformin, omeprazole, lisinopril, amlodipine and tradjenta.  His call back number is [phone]

## 2016-05-24 NOTE — Telephone Encounter (Signed)
Done.-jh 

## 2016-05-30 ENCOUNTER — Encounter: Payer: Self-pay | Admitting: Family Medicine

## 2016-05-30 ENCOUNTER — Ambulatory Visit (INDEPENDENT_AMBULATORY_CARE_PROVIDER_SITE_OTHER): Payer: Managed Care, Other (non HMO) | Admitting: Family Medicine

## 2016-05-30 VITALS — BP 143/49 | HR 75 | Temp 98.6°F | Resp 16 | Ht 70.0 in | Wt 254.0 lb

## 2016-05-30 DIAGNOSIS — I10 Essential (primary) hypertension: Secondary | ICD-10-CM

## 2016-05-30 DIAGNOSIS — E0800 Diabetes mellitus due to underlying condition with hyperosmolarity without nonketotic hyperglycemic-hyperosmolar coma (NKHHC): Secondary | ICD-10-CM

## 2016-05-30 DIAGNOSIS — Z794 Long term (current) use of insulin: Secondary | ICD-10-CM

## 2016-05-30 NOTE — Progress Notes (Signed)
Name: [name]   MRN: [id number]    DOB: November 21, 1950   Date:05/30/2016       Progress Note  Subjective  Chief Complaint  Chief Complaint  Patient presents with  . Diabetes    HPI  Here for f/u of DM.  He is on insulin (10 units of Lantus daily).  He has not checked sugars much since 1st week on Lantus.  Range from 100-200.  He has lost weight from changing his diet.  No problem-specific Assessment & Plan notes found for this encounter.   Past Medical History:  Diagnosis Date  . Allergy   . Chronic headaches   . Diabetes mellitus without complication (HCC)   . GERD (gastroesophageal reflux disease)   . Heart burn   . Hyperlipidemia   . Hypertension     Past Surgical History:  Procedure Laterality Date  . KNEE SURGERY    . SHOULDER SURGERY    . TONSILLECTOMY      Family History  Problem Relation Age of Onset  . Cancer Father     lung  . Heart disease Sister   . Heart disease Brother     Social History   Social History  . Marital status: Married    Spouse name: N/A  . Number of children: N/A  . Years of education: N/A   Occupational History  . Not on file.   Social History Main Topics  . Smoking status: Passive Smoke Exposure - Never Smoker    Packs/day: 1.00    Years: 39.00  . Smokeless tobacco: Current User    Types: Chew     Comment: wife states patient never smoked cigarettes, just chewing tobacco  . Alcohol use No  . Drug use: No  . Sexual activity: Yes    Birth control/ protection: Inserts   Other Topics Concern  . Not on file   Social History Narrative  . No narrative on file     Current Outpatient Prescriptions:  .  allopurinol (ZYLOPRIM) 100 MG tablet, Take 1 tablet (100 mg total) by mouth daily., Disp: 90 tablet, Rfl: 3 .  amLODipine (NORVASC) 2.5 MG tablet, Take 1 tablet (2.5 mg total) by mouth daily., Disp: 90 tablet, Rfl: 3 .  baclofen (LIORESAL) 10 MG tablet, Take 1 tablet (10 mg total) by mouth 3 (three) times daily.  (Patient taking differently: Take 10 mg by mouth 3 (three) times daily as needed. ), Disp: 30 each, Rfl: 3 .  colchicine 0.6 MG tablet, Take 1 tablet (0.6 mg total) by mouth as needed., Disp: 90 tablet, Rfl: 3 .  fluticasone (FLONASE) 50 MCG/ACT nasal spray, Place 2 sprays into both nostrils daily., Disp: 16 g, Rfl: 12 .  Insulin Glargine (LANTUS SOLOSTAR) 100 UNIT/ML Solostar Pen, Inject 10 Units into the skin daily at 10 pm., Disp: 3 pen, Rfl: 11 .  Insulin Pen Needle (PEN NEEDLES) 30G X 8 MM MISC, Inject 1 application into the skin daily at 10 pm., Disp: 100 each, Rfl: 12 .  linagliptin (TRADJENTA) 5 MG TABS tablet, Take 1 tablet (5 mg total) by mouth daily., Disp: 90 tablet, Rfl: 3 .  lisinopril (PRINIVIL,ZESTRIL) 40 MG tablet, Take 1 tablet (40 mg total) by mouth daily., Disp: 90 tablet, Rfl: 3 .  metaxalone (SKELAXIN) 800 MG tablet, Take 1 tablet (800 mg total) by mouth 3 (three) times daily., Disp: 30 tablet, Rfl: 6 .  metformin (FORTAMET) 1000 MG (OSM) 24 hr tablet, TAKE 1 TABLET BY MOUTH TWO  TIMES DAILY WITH MEALS, Disp: 180 tablet, Rfl: 3 .  naproxen (NAPROSYN) 500 MG tablet, Take 1 tablet (500 mg total) by mouth 2 (two) times daily with a meal., Disp: 180 tablet, Rfl: 0 .  omeprazole (PRILOSEC) 20 MG capsule, Take 1 capsule (20 mg total) by mouth daily., Disp: 90 capsule, Rfl: 3  Allergies  Allergen Reactions  . Invokana [Canagliflozin] Other (See Comments)  . Prednisone Other (See Comments)     Review of Systems  Constitutional: Negative for chills, fever, malaise/fatigue and weight loss.  HENT: Negative for hearing loss and tinnitus.   Eyes: Negative for blurred vision and double vision.  Respiratory: Negative for cough, shortness of breath and wheezing.   Cardiovascular: Negative for chest pain, palpitations and leg swelling.  Gastrointestinal: Negative for abdominal pain, blood in stool and heartburn.  Genitourinary: Negative for dysuria, frequency and urgency.   Musculoskeletal: Negative for joint pain and myalgias.  Skin: Negative for rash.  Neurological: Negative for dizziness, tingling, tremors, weakness and headaches.       Objective  Vitals:   05/30/16 0917  BP: (!) 143/49  Pulse: 75  Resp: 16  Temp: 98.6 F (37 C)  TempSrc: Oral  Weight: 254 lb (115.2 kg)  Height: 5\' 10"  (1.778 m)    Physical Exam  Constitutional: He is well-developed, well-nourished, and in no distress. No distress.  HENT:  Head: Normocephalic and atraumatic.  Eyes: Conjunctivae and EOM are normal. Pupils are equal, round, and reactive to light. No scleral icterus.  Neck: Normal range of motion. Neck supple. Carotid bruit is not present. No thyromegaly present.  Cardiovascular: Normal rate, regular rhythm and normal heart sounds.   Pulmonary/Chest: Effort normal and breath sounds normal.  Musculoskeletal: He exhibits no edema.  Lymphadenopathy:    He has no cervical adenopathy.  Neurological: He is alert. GCS score is 15.  Vitals reviewed.       Recent Results (from the past 2160 hour(s))  POCT HgB A1C     Status: Abnormal   Collection Time: 04/26/16  9:38 AM  Result Value Ref Range   Hemoglobin A1C 8.8%      Assessment & Plan  Problem List Items Addressed This Visit      Cardiovascular and Mediastinum   Hypertension     Endocrine   Diabetes (HCC) - Primary      No orders of the defined types were placed in this encounter.  1. Diabetes mellitus due to underlying condition with hyperosmolarity without coma, with long-term current use of insulin (HCC) Cont Lantus, Metformin and Tradjenta Needs to check blood sugars daily 2. Essential hypertension Cont meds

## 2016-06-27 ENCOUNTER — Telehealth: Payer: Self-pay | Admitting: Family Medicine

## 2016-06-27 DIAGNOSIS — M1000 Idiopathic gout, unspecified site: Secondary | ICD-10-CM

## 2016-06-27 MED ORDER — COLCHICINE 0.6 MG PO TABS: 0.6000 mg | ORAL_TABLET | ORAL | 3 refills | Status: DC | PRN

## 2016-06-27 NOTE — Telephone Encounter (Signed)
Refilled Colchicine 0.6mg  take once daily PRN #90 pills +3 refills, sent to [facility], as requested, same rx as last time per Dr Juanetta Gosling.  If needs lower amount or change # or change to mail order, will stay tuned.  Saralyn Pilar, DO [facility] 06/27/2016, 5:13 PM

## 2016-06-27 NOTE — Telephone Encounter (Signed)
Pt needs a refill on colchicind 0.6 mg sent to [facility].  His call back number is [phone]

## 2016-07-25 ENCOUNTER — Ambulatory Visit (INDEPENDENT_AMBULATORY_CARE_PROVIDER_SITE_OTHER): Payer: Managed Care, Other (non HMO) | Admitting: Nurse Practitioner

## 2016-07-25 ENCOUNTER — Encounter: Payer: Self-pay | Admitting: Nurse Practitioner

## 2016-07-25 VITALS — BP 143/62 | HR 62 | Temp 98.4°F | Resp 16 | Wt 245.0 lb

## 2016-07-25 DIAGNOSIS — Z23 Encounter for immunization: Secondary | ICD-10-CM

## 2016-07-25 DIAGNOSIS — E119 Type 2 diabetes mellitus without complications: Secondary | ICD-10-CM

## 2016-07-25 DIAGNOSIS — L02416 Cutaneous abscess of left lower limb: Secondary | ICD-10-CM | POA: Diagnosis not present

## 2016-07-25 DIAGNOSIS — L03116 Cellulitis of left lower limb: Secondary | ICD-10-CM

## 2016-07-25 MED ORDER — LIDOCAINE HCL (PF) 1 % IJ SOLN
2.0000 mL | Freq: Once | INTRAMUSCULAR | Status: AC
  Administered 2016-07-25: 0.5 mL via INTRADERMAL

## 2016-07-25 MED ORDER — PEN NEEDLES 30G X 8 MM MISC: 1.0000 "application " | Freq: Every day | 12 refills | Status: DC

## 2016-07-25 MED ORDER — DOXYCYCLINE HYCLATE 100 MG PO TABS: 100.0000 mg | ORAL_TABLET | Freq: Two times a day (BID) | ORAL | 0 refills | Status: AC

## 2016-07-25 MED ORDER — CEPHALEXIN 500 MG PO CAPS: 500.0000 mg | ORAL_CAPSULE | Freq: Four times a day (QID) | ORAL | 0 refills | Status: AC

## 2016-07-25 NOTE — Progress Notes (Signed)
I have reviewed this encounter including the documentation in this note and/or discussed this patient with the provider, Wilhelmina Mcardle, AGPCNP-BC. I am certifying that I agree with the content of this note as supervising physician.  Saralyn Pilar, DO [facility] 07/25/2016, 1:28 PM

## 2016-07-25 NOTE — Patient Instructions (Addendum)
[name], Thank you for coming in to clinic today.  1. You have an abscess on your Left shin. - STOP using antibiotic ointment. - START using only saline wound wash to cleanse the wound. - Put a moist gauze with saline wound wash on the wound for 1 more day.  Change this dressing in the morning and at night about every 12 hours.  After 24 hours with your second dressing change, use only dry gauze and tape or a bandaid after that.  - Take Keflex 500 mg 4 times per day for 7 days. (morning, lunch, dinner, and at bedtime). - Take doxycycline 100 mg 2 times daily for 7 days (morning and dinner).  Follow up with wound care clinic in 1 week.  If you have not seen them by next Monday, come to this clinic to see me again.   Please schedule a follow-up appointment with Wilhelmina Mcardle, AGNP in 1 week.  If you have any other questions or concerns, please feel free to call the clinic or send a message through MyChart. You may also schedule an earlier appointment if necessary.  Wilhelmina Mcardle, DNP, AGNP-BC Adult Gerontology Nurse Practitioner [facility]   Incision and Drainage, Care After Refer to this sheet in the next few weeks. These instructions provide you with information about caring for yourself after your procedure. Your health care provider may also give you more specific instructions. Your treatment has been planned according to current medical practices, but problems sometimes occur. Call your health care provider if you have any problems or questions after your procedure. What can I expect after the procedure? After the procedure, it is common to have:  Pain or discomfort around your incision site.  Drainage from your incision. Follow these instructions at home:  Take over-the-counter and prescription medicines only as told by your health care provider.  If you were prescribed an antibiotic medicine, take it as told by your health care provider.Do not stop  taking the antibiotic even if you start to feel better.  Followinstructions from your health care provider about:  How to take care of your incision.  When and how you should change your packing and bandage (dressing). Wash your hands with soap and water before you change your dressing. If soap and water are not available, use hand sanitizer.  When you should remove your dressing.  Do not take baths, swim, or use a hot tub until your health care provider approves.  Keep all follow-up visits as told by your health care provider. This is important.  Check your incision area every day for signs of infection. Check for:  More redness, swelling, or pain.  More fluid or blood.  Warmth.  Pus or a bad smell. Contact a health care provider if:  Your cyst or abscess returns.  You have a fever.  You have more redness, swelling, or pain around your incision.  You have more fluid or blood coming from your incision.  Your incision feels warm to the touch.  You have pus or a bad smell coming from your incision. Get help right away if:  You have severe pain or bleeding.  You cannot eat or drink without vomiting.  You have decreased urine output.  You become short of breath.  You have chest pain.  You cough up blood.  The area where the incision and drainage occurred becomes numb or it tingles. This information is not intended to replace advice given to you by  your health care provider. Make sure you discuss any questions you have with your health care provider. Document Released: 06/06/2011 Document Revised: 08/14/2015 Document Reviewed: 01/02/2015 Elsevier Interactive Patient Education  2017 [employer].

## 2016-07-25 NOTE — Progress Notes (Signed)
Subjective:    Patient ID: [name], male    DOB: 1950-11-30, 66 y.o.   MRN: [id number]  [name] is a 66 y.o. male presenting on 07/25/2016 for Rash (Patient here today C/O rash on left leg. Patient reports leg injury with a 4x4 about 2 weeks ago.)   HPI  Two weeks ago,[name] was redoing a deck when one of the old 4 x 4 posts fell out of tractor bucket and onto his left leg.  It hit his upper shin and continued down his shin to just above the ankle.edoing a deck.  He has had some discomfort described as "throbbing like a toothache" in his LLE.  He noticed increased redness about 1 week ago. He went to the beach last Thursday and has kept it covered.  He has noticed a couple places on his skin that blistered.  He popped one that "only had fluid on top of the skin."  There is now a scab at this site.    Using antibiotic ointment for the last 1 week and has tried to get the skin to pop open and drain like a pimple.  There is some cloudy and slightly bloody drainage on his bandaids.     Social History  Substance Use Topics  . Smoking status: Passive Smoke Exposure - Never Smoker    Packs/day: 1.00    Years: 39.00  . Smokeless tobacco: Current User    Types: Chew     Comment: wife states patient never smoked cigarettes, just chewing tobacco  . Alcohol use No    Review of Systems Per HPI unless specifically indicated above     Objective:    BP (!) 143/62 (BP Location: Left Arm, Patient Position: Sitting, Cuff Size: Large)   Pulse 62   Temp 98.4 F (36.9 C) (Oral)   Resp 16   Wt 245 lb (111.1 kg)   SpO2 99%   BMI 35.15 kg/m    Wt Readings from Last 3 Encounters:  07/25/16 245 lb (111.1 kg)  05/30/16 254 lb (115.2 kg)  04/26/16 262 lb (118.8 kg)    Physical Exam  Constitutional: He appears well-developed and well-nourished. No distress.  HENT:  Head: Normocephalic and atraumatic.  Eyes:  Hard of hearing with hearing aids  Neck: Normal range of motion.    Musculoskeletal:  Limited ROM at Left knee r/t osteoarthritis Full ROM of Left ankle  Skin: Skin is warm and dry. Lesion noted. He is not diaphoretic. No pallor.     Psychiatric: He has a normal mood and affect. His behavior is normal.   Anterior LLE below knee Upper wound: 0.75 cm x 0.5 cm with slough covering wound. Lower wound: abscess with pus draining 2 cm x 2 cm   Anterior LLE below knee Upper wound: Slough removed, 0.5 cm deep, without tunneling with moist gauze packing in place.  Lower wound: abscess after I&D.  Unable to pack with gauze packing r/t shallow depth of wound.     _____________________________________________________________________ PROCEDURE NOTE Date: 07/25/2016 Incision and Drainage of LLE abscess Verbal consent given by patient. Time out taken. Area cleaned with alcohol swabs. Local anesthesia with xylocaine 1% 0.5cc injected into site and surrounding tissue. Area prepped with betadine swabs x 3. Scalpel 11 blade used to make approx 0.5 inch incision along center of mass, immediate drainage of liquid pus material and then further expression of thicker purulent sebaceous material manually expressed from site. Hemostats as blunt dissection used  to break up any loculations. Gauze packing packing applied in sterile fashion. Area was cleaned, gauze and loose bandage applied. No complications. EBL < 5 cc.   Results for orders placed or performed in visit on 04/26/16  POCT HgB A1C  Result Value Ref Range   Hemoglobin A1C 8.8%       Assessment & Plan:   Problem List Items Addressed This Visit    None    Visit Diagnoses    Cellulitis and abscess of left leg    -  Primary Acute injury to anterior LLE below knee 2 weeks ago with acute worsening and drainage of pus from skin.  Plan: 1. Removal of eschar and I&D with verbal consent provided today. 2. Provide gram positive and gram negative coverage secondary to pus drainage from abscess.  - Take cephalexin  500 mg 4 times per day for 7 days.  - Take doxycycline 100 mg 2 times per day for 7 days. 3. Administer Tdap vaccine today in clinic. 4. Ambulatory referral to wound care center.   Relevant Medications   cephALEXin (KEFLEX) 500 MG capsule   doxycycline (VIBRA-TABS) 100 MG tablet   Other Relevant Orders   Tdap vaccine greater than or equal to 7yo IM (Completed)   AMB referral to wound care center   Diabetes mellitus without complication (HCC)     Regularly followed chronic disease with last hemoglobin A1c = 8.8  Plan: 1. Continue with next follow up on May 23rd with Dr. Kirtland Bouchard. 2. Inspect skin and feet daily for wounds that are not healing and seek immediate care.   Relevant Medications   Insulin Pen Needle (PEN NEEDLES) 30G X 8 MM MISC   Vaccine for tetanus toxoid     Patient does not remember the last dose of tetanus vaccine.  Patient with recent traumatic injury to skin with non-intact skin.   Plan: 1. Administer Tdap vaccine today.   Relevant Orders   Tdap vaccine greater than or equal to 7yo IM (Completed)      Meds ordered this encounter  Medications  . cephALEXin (KEFLEX) 500 MG capsule    Sig: Take 1 capsule (500 mg total) by mouth 4 (four) times daily.    Dispense:  28 capsule    Refill:  0    Order Specific Question:   Supervising Provider    Answer:   [name] [[id number]]  . Insulin Pen Needle (PEN NEEDLES) 30G X 8 MM MISC    Sig: Inject 1 application into the skin daily at 10 pm.    Dispense:  100 each    Refill:  12    Order Specific Question:   Supervising Provider    Answer:   [name] [[id number]]  . doxycycline (VIBRA-TABS) 100 MG tablet    Sig: Take 1 tablet (100 mg total) by mouth 2 (two) times daily.    Dispense:  14 tablet    Refill:  0    Order Specific Question:   Supervising Provider    Answer:   [name] [[id number]]  . lidocaine (PF) (XYLOCAINE) 1 % injection 2 mL      Follow up plan:  Return in about 1 week  (around 08/01/2016).  Can cancel appointment with me if appointment made with wound care center.  Wilhelmina Mcardle, DNP, AGPCNP-BC Adult Gerontology Primary Care Nurse Practitioner [name] 07/25/2016, 1:03 PM

## 2016-07-28 ENCOUNTER — Encounter: Payer: Managed Care, Other (non HMO) | Attending: Surgery | Admitting: Surgery

## 2016-07-28 DIAGNOSIS — E11622 Type 2 diabetes mellitus with other skin ulcer: Secondary | ICD-10-CM | POA: Diagnosis not present

## 2016-07-28 DIAGNOSIS — I10 Essential (primary) hypertension: Secondary | ICD-10-CM | POA: Insufficient documentation

## 2016-07-28 DIAGNOSIS — M109 Gout, unspecified: Secondary | ICD-10-CM | POA: Diagnosis not present

## 2016-07-28 DIAGNOSIS — Z888 Allergy status to other drugs, medicaments and biological substances status: Secondary | ICD-10-CM | POA: Diagnosis not present

## 2016-07-28 DIAGNOSIS — L97222 Non-pressure chronic ulcer of left calf with fat layer exposed: Secondary | ICD-10-CM | POA: Diagnosis not present

## 2016-07-28 DIAGNOSIS — E785 Hyperlipidemia, unspecified: Secondary | ICD-10-CM | POA: Insufficient documentation

## 2016-07-28 DIAGNOSIS — Z79899 Other long term (current) drug therapy: Secondary | ICD-10-CM | POA: Diagnosis not present

## 2016-07-28 DIAGNOSIS — F17229 Nicotine dependence, chewing tobacco, with unspecified nicotine-induced disorders: Secondary | ICD-10-CM | POA: Insufficient documentation

## 2016-07-28 DIAGNOSIS — K219 Gastro-esophageal reflux disease without esophagitis: Secondary | ICD-10-CM | POA: Diagnosis not present

## 2016-07-28 DIAGNOSIS — Z794 Long term (current) use of insulin: Secondary | ICD-10-CM | POA: Diagnosis not present

## 2016-07-28 DIAGNOSIS — E669 Obesity, unspecified: Secondary | ICD-10-CM | POA: Insufficient documentation

## 2016-07-28 DIAGNOSIS — M545 Low back pain: Secondary | ICD-10-CM | POA: Insufficient documentation

## 2016-07-28 DIAGNOSIS — L03116 Cellulitis of left lower limb: Secondary | ICD-10-CM | POA: Diagnosis not present

## 2016-07-30 NOTE — Progress Notes (Addendum)
[name], [name] ([id number]) Visit Report for 07/28/2016 Allergy List Details Patient Name: [name], [name] Date of Service: 07/28/2016 10:45 AM Medical Record Number: [id number] Patient Account Number: [id number] Date of Birth/Sex: 02-Aug-1950 (65 y.o. Male) Treating RN: Phillis Haggis Primary Care Dioselina Brumbaugh: Wilhelmina Mcardle Other Clinician: Referring Doyne Ellinger: Wilhelmina Mcardle Treating Donnalynn Wheeless/Extender: Rudene Re in Treatment: 0 Allergies Active Allergies prednisone Invokana Allergy Notes Electronic Signature(s) Signed: 07/28/2016 4:23:06 PM By: Alejandro Mulling Entered By: Alejandro Mulling on 07/28/2016 10:41:58 [place], [name] ([id number]) -------------------------------------------------------------------------------- Arrival Information Details Patient Name: [name] Date of Service: 07/28/2016 10:45 AM Medical Record Number: [id number] Patient Account Number: [id number] Date of Birth/Sex: [birth year] (65 y.o. Male) Treating RN: Phillis Haggis Primary Care Tayleigh Wetherell: Wilhelmina Mcardle Other Clinician: Referring Makenzey Nanni: Wilhelmina Mcardle Treating Chas Axel/Extender: Rudene Re in Treatment: 0 Visit Information Patient Arrived: Ambulatory Arrival Time: 10:36 Accompanied By: self Transfer Assistance: None Patient Identification Verified: Yes Secondary Verification Process Yes Completed: Patient Requires Transmission-Based No Precautions: Patient Has Alerts: Yes Patient Alerts: DM II Electronic Signature(s) Signed: 07/28/2016 4:23:06 PM By: Alejandro Mulling Entered By: Alejandro Mulling on 07/28/2016 10:40:49 Dimarco, [name] ([id number]) -------------------------------------------------------------------------------- Clinic Level of Care Assessment Details Patient Name: [name] Date of Service: 07/28/2016 10:45 AM Medical Record Number: [id number] Patient Account Number: [id number] Date of Birth/Sex: 08/11/1950 (65 y.o. Male) Treating RN: Phillis Haggis Primary Care Calen Geister: Wilhelmina Mcardle Other Clinician: Referring Keani Gotcher: Wilhelmina Mcardle Treating Lajoy Vanamburg/Extender: Rudene Re in Treatment: 0 Clinic Level of Care Assessment Items TOOL 2 Quantity Score X - Use when only an EandM is performed on the INITIAL visit 1 0 ASSESSMENTS - Nursing Assessment / Reassessment X - General Physical Exam (combine w/ comprehensive assessment (listed just 1 20 below) when performed on new pt. evals) X - Comprehensive Assessment (HX, ROS, Risk Assessments, Wounds Hx, etc.) 1 25 ASSESSMENTS - Wound and Skin Assessment / Reassessment []  - Simple Wound Assessment / Reassessment - one wound 0 X - Complex Wound Assessment / Reassessment - multiple wounds 3 5 []  - Dermatologic / Skin Assessment (not related to wound area) 0 ASSESSMENTS - Ostomy and/or Continence Assessment and Care []  - Incontinence Assessment and Management 0 []  - Ostomy Care Assessment and Management (repouching, etc.) 0 PROCESS - Coordination of Care []  - Simple Patient / Family Education for ongoing care 0 X - Complex (extensive) Patient / Family Education for ongoing care 1 20 X - Staff obtains Chiropractor, Records, Test Results / Process Orders 1 10 []  - Staff telephones HHA, Nursing Homes / Clarify orders / etc 0 []  - Routine Transfer to another Facility (non-emergent condition) 0 []  - Routine Hospital Admission (non-emergent condition) 0 []  - New Admissions / Manufacturing engineer / Ordering NPWT, Apligraf, etc. 0 []  - Emergency Hospital Admission (emergent condition) 0 X - Simple Discharge Coordination 1 10 [name], Daniel E. ([id number]) []  - Complex (extensive) Discharge Coordination 0 PROCESS - Special Needs []  - Pediatric / Minor Patient Management 0 []  - Isolation Patient Management 0 []  - Hearing / Language / Visual special needs 0 []  - Assessment of Community assistance (transportation, D/C planning, etc.) 0 []  - Additional assistance / Altered mentation  0 []  - Support Surface(s) Assessment (bed, cushion, seat, etc.) 0 INTERVENTIONS - Wound Cleansing / Measurement X - Wound Imaging (photographs - any number of wounds) 1 5 []  - Wound Tracing (instead of photographs) 0 []  - Simple Wound Measurement - one wound 0 X - Complex Wound Measurement - multiple wounds 3 5 []  -  Simple Wound Cleansing - one wound 0 X - Complex Wound Cleansing - multiple wounds 3 5 INTERVENTIONS - Wound Dressings X - Small Wound Dressing one or multiple wounds 3 10 []  - Medium Wound Dressing one or multiple wounds 0 []  - Large Wound Dressing one or multiple wounds 0 []  - Application of Medications - injection 0 INTERVENTIONS - Miscellaneous []  - External ear exam 0 []  - Specimen Collection (cultures, biopsies, blood, body fluids, etc.) 0 []  - Specimen(s) / Culture(s) sent or taken to Lab for analysis 0 []  - Patient Transfer (multiple staff / Michiel Sites Lift / Similar devices) 0 []  - Simple Staple / Suture removal (25 or less) 0 []  - Complex Staple / Suture removal (26 or more) 0 [name], Daniel E. ([id number]) []  - Hypo / Hyperglycemic Management (close monitor of Blood Glucose) 0 X - Ankle / Brachial Index (ABI) - do not check if billed separately 1 15 Has the patient been seen at the hospital within the last three years: Yes Total Score: 180 Level Of Care: New/Established - Level 5 Electronic Signature(s) Signed: 07/28/2016 4:23:06 PM By: Alejandro Mulling Entered By: Alejandro Mulling on 07/28/2016 13:02:01 [place], [name] ([id number]) -------------------------------------------------------------------------------- Encounter Discharge Information Details Patient Name: [name] Date of Service: 07/28/2016 10:45 AM Medical Record Number: [id number] Patient Account Number: [id number] Date of Birth/Sex: [birth year] (65 y.o. Male) Treating RN: Phillis Haggis Primary Care Sion Reinders: Wilhelmina Mcardle Other Clinician: Referring Sangeeta Youse: Wilhelmina Mcardle Treating  Jhan Conery/Extender: Rudene Re in Treatment: 0 Encounter Discharge Information Items Discharge Pain Level: 0 Discharge Condition: Stable Ambulatory Status: Ambulatory Discharge Destination: Home Transportation: Private Auto Accompanied By: self Schedule Follow-up Appointment: Yes Medication Reconciliation completed No and provided to Patient/Care Deidre Carino: Patient Clinical Summary of Care: Declined Electronic Signature(s) Signed: 07/28/2016 12:05:56 PM By: Francie Massing Entered By: Francie Massing on 07/28/2016 12:05:56 Degregorio, [name] ([id number]) -------------------------------------------------------------------------------- Lower Extremity Assessment Details Patient Name: [name] Date of Service: 07/28/2016 10:45 AM Medical Record Number: [id number] Patient Account Number: [id number] Date of Birth/Sex: [birth year] (65 y.o. Male) Treating RN: Ashok Cordia, Debi Primary Care Zaiyden Strozier: Wilhelmina Mcardle Other Clinician: Referring Keyandra Swenson: Wilhelmina Mcardle Treating Sly Parlee/Extender: Rudene Re in Treatment: 0 Edema Assessment Assessed: [Left: No] [Right: No] E[Left: dema] [Right: :] Calf Left: Right: Point of Measurement: 30 cm From Medial Instep 36 cm cm Ankle Left: Right: Point of Measurement: 9 cm From Medial Instep 23 cm cm Vascular Assessment Pulses: Dorsalis Pedis Palpable: [Left:Yes] Posterior Tibial Palpable: [Left:Yes] Extremity colors, hair growth, and conditions: Extremity Color: [Left:Normal] Temperature of Extremity: [Left:Warm] Capillary Refill: [Left:< 3 seconds] Dependent Rubor: [Left:Yes] Blood Pressure: Brachial: [Left:130] Dorsalis Pedis: 148 [Left:Dorsalis Pedis:] Ankle: Posterior Tibial: 140 [Left:Posterior Tibial: 1.14] Toe Nail Assessment Left: Right: Thick: No Discolored: No Deformed: No Improper Length and Hygiene: No Electronic Signature(s) [name], [name] ([id number]) Signed: 07/28/2016 4:23:06 PM By: Alejandro Mulling Entered By: Alejandro Mulling on 07/28/2016 11:02:44 [name], [name] ([id number]) -------------------------------------------------------------------------------- Multi Wound Chart Details Patient Name: [name] Date of Service: 07/28/2016 10:45 AM Medical Record Number: [id number] Patient Account Number: [id number] Date of Birth/Sex: 1951/03/22 (66 y.o. Male) Treating RN: Phillis Haggis Primary Care Deagen Krass: Wilhelmina Mcardle Other Clinician: Referring Dashon Mcintire: Wilhelmina Mcardle Treating Special Ranes/Extender: Rudene Re in Treatment: 0 Vital Signs Height(in): 71 Pulse(bpm): 70 Weight(lbs): 239.8 Blood Pressure 121/70 (mmHg): Body Mass Index(BMI): 33 Temperature(F): 98.6 Respiratory Rate 18 (breaths/min): Photos: [1:No Photos] [2:No Photos] [3:No Photos] Wound Location: [1:Left Lower Leg - Anterior, Proximal] [2:Left, Distal, Anterior Lower Left Lower  Leg - Lateral Leg] Wounding Event: [1:Trauma] [2:Trauma] [3:Trauma] Primary Etiology: [1:Diabetic Wound/Ulcer of the Lower Extremity] [2:Diabetic Wound/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] [3:the Lower Extremity] Secondary Etiology: [1:Trauma, Other] [2:Trauma, Other] [3:Trauma, Other] Comorbid History: [1:Cataracts, Hypertension, Type II Diabetes, Gout] [2:Cataracts, Hypertension, Cataracts, Hypertension, Type II Diabetes, Gout] [3:Type II Diabetes, Gout] Date Acquired: [1:07/14/2016] [2:07/07/2016] [3:07/07/2016] Weeks of Treatment: [1:0] [2:0] [3:0] Wound Status: [1:Open] [2:Open] [3:Open] Clustered Wound: [1:Yes] [2:Yes] [3:No] Clustered Quantity: [1:N/A] [2:2] [3:N/A] Measurements L x W x D 0.5x0.4x0.2 [2:0.7x1x0.1] [3:0.4x0.3x0.1] (cm) Area (cm) : [1:0.157] [2:0.55] [3:0.094] Volume (cm) : [1:0.031] [2:0.055] [3:0.009] % Reduction in Area: [1:N/A] [2:88.20%] [3:N/A] % Reduction in Volume: N/A [2:96.10%] [3:N/A] Starting Position 1 12 [2:11] (o'clock): Ending Position 1  [2:9] (o'clock): Maximum Distance 1 0.1 [2:0.8] (cm): Undermining: [1:Yes] [2:Yes] [3:No] Classification: [1:Grade 1] [2:Grade 1] [3:Grade 1] Exudate Amount: [1:Large] [2:Large] [3:Large] Exudate Type: [1:Serosanguineous] [2:Purulent] [3:Serosanguineous] Exudate Color: red, brown yellow, brown, green red, brown Wound Margin: Distinct, outline attached Distinct, outline attached Distinct, outline attached Granulation Amount: Small (1-33%) Medium (34-66%) Large (67-100%) Granulation Quality: Red Red Red Necrotic Amount: Large (67-100%) Medium (34-66%) None Present (0%) Epithelialization: None None None Periwound Skin Texture: No Abnormalities Noted No Abnormalities Noted No Abnormalities Noted Periwound Skin No Abnormalities Noted No Abnormalities Noted No Abnormalities Noted Moisture: Periwound Skin Color: Erythema: Yes Erythema: Yes Erythema: Yes Erythema Location: Circumferential Circumferential Circumferential Temperature: No Abnormality No Abnormality No Abnormality Tenderness on Yes Yes Yes Palpation: Wound Preparation: Ulcer Cleansing: Ulcer Cleansing: Ulcer Cleansing: Rinsed/Irrigated with Rinsed/Irrigated with Rinsed/Irrigated with Saline Saline Saline Topical Anesthetic Topical Anesthetic Topical Anesthetic Applied: Other: lidocaine Applied: Other: lidocaine Applied: Other: lidocaine 4% 4% 4% Treatment Notes Electronic Signature(s) Signed: 07/28/2016 11:33:53 AM By: Evlyn KannerBritto, Errol MD, FACS Entered By: Evlyn KannerBritto, Errol on 07/28/2016 11:33:53 [name], Daniel HillierKENNETH E. ([id number]) -------------------------------------------------------------------------------- Multi-Disciplinary Care Plan Details Patient Name: [name], Daniel E. Date of Service: 07/28/2016 10:45 AM Medical Record Number: [id number] Patient Account Number: [id number] Date of Birth/Sex: 12/13/1950 (65 y.o. Male) Treating RN: Phillis HaggisPinkerton, Debi Primary Care Amelio Brosky: Wilhelmina McardleKENNEDY, [name] Other Clinician: Referring Aquila Delaughter: Wilhelmina McardleKENNEDY,  [name] Treating Maleiah Dula/Extender: Rudene ReBritto, Errol Weeks in Treatment: 0 Active Inactive Electronic Signature(s) Signed: 08/04/2016 4:42:14 PM By: Elliot GurneyWoody, RN, BSN, Kim RN, BSN Signed: 08/05/2016 4:07:51 PM By: Alejandro MullingPinkerton, Debra Previous Signature: 07/28/2016 4:23:06 PM Version By: Alejandro MullingPinkerton, Debra Entered By: Elliot GurneyWoody, RN, BSN, Kim on 08/04/2016 10:08:32 [name], Daniel E. ([id number]) -------------------------------------------------------------------------------- Pain Assessment Details Patient Name: [name], Siddhanth E. Date of Service: 07/28/2016 10:45 AM Medical Record Number: [id number] Patient Account Number: [id number] Date of Birth/Sex: 04/08/1950 [age] y.o. Male) Treating RN: Phillis HaggisPinkerton, Debi Primary Care Krystn Dermody: Wilhelmina McardleKENNEDY, [name] Other Clinician: Referring Faren Florence: Wilhelmina McardleKENNEDY, [name] Treating Rubel Heckard/Extender: Rudene ReBritto, Errol Weeks in Treatment: 0 Active Problems Location of Pain Severity and Description of Pain Patient Has Paino Yes Site Locations Pain Location: Pain in Ulcers Rate the pain. Current Pain Level: 2 Character of Pain Describe the Pain: Aching Pain Management and Medication Current Pain Management: Electronic Signature(s) Signed: 07/28/2016 4:23:06 PM By: Alejandro MullingPinkerton, Debra Entered By: Alejandro MullingPinkerton, Debra on 07/28/2016 10:41:14 [name], Daniel HillierKENNETH E. ([id number]) -------------------------------------------------------------------------------- Patient/Caregiver Education Details Patient Name: [name], Zhion E. Date of Service: 07/28/2016 10:45 AM Medical Record Number: [id number] Patient Account Number: [id number] Date of Birth/Gender: 09/19/1950 [age] y.o. Male) Treating RN: Phillis HaggisPinkerton, Debi Primary Care Physician: Wilhelmina McardleKENNEDY, [name] Other Clinician: Referring Physician: Wilhelmina McardleKENNEDY, [name] Treating Physician/Extender: Rudene ReBritto, Errol Weeks in Treatment: 0 Education Assessment Education Provided To: Patient Education Topics Provided Infection: Handouts: Infection Prevention and Management Methods:  Explain/Verbal Responses:  State content correctly [facility]: Handouts: [facility] Methods: Explain/Verbal Responses: State content correctly Wound/Skin Impairment: Handouts: Other: change dressing as ordered Methods: Demonstration, Explain/Verbal Responses: State content correctly Electronic Signature(s) Signed: 07/28/2016 4:23:06 PM By: Alejandro Mulling Entered By: Alejandro Mulling on 07/28/2016 11:54:18 Ottaviano, [name] ([id number]) -------------------------------------------------------------------------------- Wound Assessment Details Patient Name: [name] Date of Service: 07/28/2016 10:45 AM Medical Record Number: [id number] Patient Account Number: [id number] Date of Birth/Sex: [birth year] (66 y.o. Male) Treating RN: Ashok Cordia, Debi Primary Care Rita Prom: Wilhelmina Mcardle Other Clinician: Referring Jeanae Whitmill: Wilhelmina Mcardle Treating Braelynne Garinger/Extender: Rudene Re in Treatment: 0 Wound Status Wound Number: 1 Primary Diabetic Wound/Ulcer of the Lower Etiology: Extremity Wound Location: Left Lower Leg - Anterior, Proximal Secondary Trauma, Other Etiology: Wounding Event: Trauma Wound Status: Open Date Acquired: 07/14/2016 Comorbid Cataracts, Hypertension, Type II Weeks Of Treatment: 0 History: Diabetes, Gout Clustered Wound: Yes Photos Photo Uploaded By: Alejandro Mulling on 07/29/2016 07:52:09 Wound Measurements Length: (cm) 2.2 Width: (cm) 1.4 Depth: (cm) 0.2 Clustered Quantity: 2 Area: (cm) 2.419 Volume: (cm) 0.484 % Reduction in Area: -1440.8% % Reduction in Volume: -1461.3% Epithelialization: None Tunneling: No Undermining: Yes Starting Position (o'clock): 12 Ending Position (o'clock): 9 Maximum Distance: (cm) 1.9 Wound Description Classification: Grade 1 Wound Margin: Distinct, outline attached Exudate Amount: Large Exudate Type: Serosanguineous Exudate Color: red, brown Foul Odor After  Cleansing: No Slough/Fibrino Yes Wound Bed Dales, Guillermo E. ([id number]) Granulation Amount: Small (1-33%) Granulation Quality: Red Necrotic Amount: Large (67-100%) Necrotic Quality: Adherent Slough Periwound Skin Texture Texture Color No Abnormalities Noted: No No Abnormalities Noted: No Erythema: Yes Moisture Erythema Location: Circumferential No Abnormalities Noted: No Temperature / Pain Temperature: No Abnormality Tenderness on Palpation: Yes Wound Preparation Ulcer Cleansing: Rinsed/Irrigated with Saline Topical Anesthetic Applied: Other: lidocaine 4%, Electronic Signature(s) Signed: 07/28/2016 4:23:06 PM By: Alejandro Mulling Entered By: Alejandro Mulling on 07/28/2016 11:34:27 Ciliberto, [name] ([id number]) -------------------------------------------------------------------------------- Wound Assessment Details Patient Name: [name] Date of Service: 07/28/2016 10:45 AM Medical Record Number: [id number] Patient Account Number: [id number] Date of Birth/Sex: 08/09/1950 (66 y.o. Male) Treating RN: Ashok Cordia, Debi Primary Care Guillaume Weninger: Wilhelmina Mcardle Other Clinician: Referring Aliyah Abeyta: Wilhelmina Mcardle Treating Emry Barbato/Extender: Rudene Re in Treatment: 0 Wound Status Wound Number: 2 Primary Diabetic Wound/Ulcer of the Lower Etiology: Extremity Wound Location: Left, Distal, Anterior Lower Leg Secondary Trauma, Other Wounding Event: Trauma Etiology: Date Acquired: 07/07/2016 Wound Status: Open Weeks Of Treatment: 0 Comorbid Cataracts, Hypertension, Type II Clustered Wound: Yes History: Diabetes, Gout Photos Photo Uploaded By: Alejandro Mulling on 07/29/2016 07:52:10 Wound Measurements Length: (cm) 0.7 Width: (cm) 1 Depth: (cm) 0.1 Clustered Quantity: 2 Area: (cm) 0.55 Volume: (cm) 0.055 % Reduction in Area: 88.2% % Reduction in Volume: 96.1% Epithelialization: None Tunneling: No Undermining: Yes Starting Position (o'clock): 11 Ending  Position (o'clock): 9 Maximum Distance: (cm) 0.8 Wound Description Classification: Grade 1 Wound Margin: Distinct, outline attached Exudate Amount: Large Exudate Type: Purulent Exudate Color: yellow, brown, green Foul Odor After Cleansing: No Slough/Fibrino No Wound Bed Frankl, Hammad E. ([id number]) Granulation Amount: Medium (34-66%) Granulation Quality: Red Necrotic Amount: Medium (34-66%) Necrotic Quality: Adherent Slough Periwound Skin Texture Texture Color No Abnormalities Noted: No No Abnormalities Noted: No Erythema: Yes Moisture Erythema Location: Circumferential No Abnormalities Noted: No Temperature / Pain Temperature: No Abnormality Tenderness on Palpation: Yes Wound Preparation Ulcer Cleansing: Rinsed/Irrigated with Saline Topical Anesthetic Applied: Other: lidocaine 4%, Electronic Signature(s) Signed: 07/28/2016 4:23:06 PM By: Alejandro Mulling Entered By: Alejandro Mulling on 07/28/2016  11:32:47 TAISHAUN, LEVELS ([id number]) -------------------------------------------------------------------------------- Wound Assessment Details Patient Name: JEREMIA, GROOT Date of Service: 07/28/2016 10:45 AM Medical Record Number: [id number] Patient Account Number: [id number] Date of Birth/Sex: 01-08-1951 (66 y.o. Male) Treating RN: Phillis Haggis Primary Care Geraldyn Shain: Wilhelmina Mcardle Other Clinician: Referring Anjali Manzella: Wilhelmina Mcardle Treating Monserrath Junio/Extender: Rudene Re in Treatment: 0 Wound Status Wound Number: 3 Primary Diabetic Wound/Ulcer of the Lower Etiology: Extremity Wound Location: Left Lower Leg - Lateral Secondary Trauma, Other Wounding Event: Trauma Etiology: Date Acquired: 07/07/2016 Wound Status: Open Weeks Of Treatment: 0 Comorbid Cataracts, Hypertension, Type II Clustered Wound: No History: Diabetes, Gout Photos Photo Uploaded By: Alejandro Mulling on 07/29/2016 07:52:35 Wound Measurements Length: (cm) 0.4 Width: (cm) 0.3 Depth: (cm)  0.1 Area: (cm) 0.094 Volume: (cm) 0.009 % Reduction in Area: % Reduction in Volume: Epithelialization: None Tunneling: No Undermining: No Wound Description Classification: Grade 1 Foul Odor Aft Wound Margin: Distinct, outline attached Slough/Fibrin Exudate Amount: Large Exudate Type: Serosanguineous Exudate Color: red, brown er Cleansing: No o No Wound Bed Granulation Amount: Large (67-100%) Granulation Quality: Red Necrotic Amount: None Present (0%) Kaspar, Kiyan E. ([id number]) Periwound Skin Texture Texture Color No Abnormalities Noted: No No Abnormalities Noted: No Erythema: Yes Moisture Erythema Location: Circumferential No Abnormalities Noted: No Temperature / Pain Temperature: No Abnormality Tenderness on Palpation: Yes Wound Preparation Ulcer Cleansing: Rinsed/Irrigated with Saline Topical Anesthetic Applied: Other: lidocaine 4%, Electronic Signature(s) Signed: 07/28/2016 4:23:06 PM By: Alejandro Mulling Entered By: Alejandro Mulling on 07/28/2016 11:16:06 Fiero, [name] ([id number]) -------------------------------------------------------------------------------- Vitals Details Patient Name: [name] Date of Service: 07/28/2016 10:45 AM Medical Record Number: [id number] Patient Account Number: [id number] Date of Birth/Sex: 13-Jun-1950 (66 y.o. Male) Treating RN: Phillis Haggis Primary Care Jenasia Dolinar: Wilhelmina Mcardle Other Clinician: Referring Kodey Xue: Wilhelmina Mcardle Treating Maxxwell Edgett/Extender: Rudene Re in Treatment: 0 Vital Signs Time Taken: 10:40 Temperature (F): 98.6 Height (in): 71 Pulse (bpm): 70 Source: Stated Respiratory Rate (breaths/min): 18 Weight (lbs): 239.8 Blood Pressure (mmHg): 121/70 Source: Measured Reference Range: 80 - 120 mg / dl Body Mass Index (BMI): 33.4 Pulse Oximetry (%): 99 Electronic Signature(s) Signed: 07/28/2016 4:23:06 PM By: Alejandro Mulling Entered By: Alejandro Mulling on 07/28/2016 11:21:42

## 2016-07-30 NOTE — Progress Notes (Signed)
AVID, GUILLETTE ([id number]) Visit Report for 07/28/2016 Abuse/Suicide Risk Screen Details Patient Name: [name], [name] Date of Service: 07/28/2016 10:45 AM Medical Record Number: [id number] Patient Account Number: [id number] Date of Birth/Sex: [birth year] (66 y.o. Male) Treating RN: Phillis Haggis Primary Care Shamela Haydon: Wilhelmina Mcardle Other Clinician: Referring Lynnda Wiersma: Wilhelmina Mcardle Treating Alok Minshall/Extender: Rudene Re in Treatment: 0 Abuse/Suicide Risk Screen Items Answer ABUSE/SUICIDE RISK SCREEN: Has anyone close to you tried to hurt or harm you recentlyo No Do you feel uncomfortable with anyone in your familyo No Has anyone forced you do things that you didnot want to doo No Do you have any thoughts of harming yourselfo No Patient displays signs or symptoms of abuse and/or neglect. No Electronic Signature(s) Signed: 07/28/2016 4:23:06 PM By: Alejandro Mulling Entered By: Alejandro Mulling on 07/28/2016 10:49:24 Kommer, Mickie Hillier ([id number]) -------------------------------------------------------------------------------- Activities of Daily Living Details Patient Name: [name] Date of Service: 07/28/2016 10:45 AM Medical Record Number: [id number] Patient Account Number: [id number] Date of Birth/Sex: 02/23/1951 (66 y.o. Male) Treating RN: Phillis Haggis Primary Care Shalaina Guardiola: Wilhelmina Mcardle Other Clinician: Referring Christhoper Busbee: Wilhelmina Mcardle Treating Malayja Freund/Extender: Rudene Re in Treatment: 0 Activities of Daily Living Items Answer Activities of Daily Living (Please select one for each item) Drive Automobile Completely Able Take Medications Completely Able Use Telephone Completely Able Care for Appearance Completely Able Use Toilet Completely Able Bath / Shower Completely Able Dress Self Completely Able Feed Self Completely Able Walk Completely Able Get In / Out Bed Completely Able Housework Completely Able Prepare Meals Completely  Able Handle Money Completely Able Shop for Self Completely Able Electronic Signature(s) Signed: 07/28/2016 4:23:06 PM By: Alejandro Mulling Entered By: Alejandro Mulling on 07/28/2016 10:49:47 Rhatigan, Mickie Hillier ([id number]) -------------------------------------------------------------------------------- Education Assessment Details Patient Name: [name] Date of Service: 07/28/2016 10:45 AM Medical Record Number: [id number] Patient Account Number: [id number] Date of Birth/Sex: 1950/06/15 (66 y.o. Male) Treating RN: Phillis Haggis Primary Care Camyah Pultz: Wilhelmina Mcardle Other Clinician: Referring Aamina Skiff: Wilhelmina Mcardle Treating Tracer Gutridge/Extender: Rudene Re in Treatment: 0 Primary Learner Assessed: Patient Learning Preferences/Education Level/Primary Language Learning Preference: Explanation, Printed Material Highest Education Level: High School Preferred Language: English Cognitive Barrier Assessment/Beliefs Language Barrier: No Translator Needed: No Memory Deficit: No Emotional Barrier: No Cultural/Religious Beliefs Affecting Medical No Care: Physical Barrier Assessment Impaired Vision: No Impaired Hearing: Yes HOH Decreased Hand dexterity: No Knowledge/Comprehension Assessment Knowledge Level: Medium Comprehension Level: Medium Ability to understand written Medium instructions: Ability to understand verbal Medium instructions: Motivation Assessment Anxiety Level: Calm Cooperation: Cooperative Education Importance: Acknowledges Need Interest in Health Problems: Asks Questions Perception: Coherent Willingness to Engage in Self- Medium Management Activities: Readiness to Engage in Self- Medium Management Activities: Electronic Signature(s) SMITTY, ACKERLEY ([id number]) Signed: 07/28/2016 4:23:06 PM By: Alejandro Mulling Entered By: Alejandro Mulling on 07/28/2016 10:50:11 Valade, Mickie Hillier  ([id number]) -------------------------------------------------------------------------------- Fall Risk Assessment Details Patient Name: [name] Date of Service: 07/28/2016 10:45 AM Medical Record Number: [id number] Patient Account Number: [id number] Date of Birth/Sex: 1951/02/01 (66 y.o. Male) Treating RN: Phillis Haggis Primary Care Arriona Prest: Wilhelmina Mcardle Other Clinician: Referring Teyla Skidgel: Wilhelmina Mcardle Treating Darbie Biancardi/Extender: Rudene Re in Treatment: 0 Fall Risk Assessment Items Have you had 2 or more falls in the last 12 monthso 0 No Have you had any fall that resulted in injury in the last 12 monthso 0 No FALL RISK ASSESSMENT: History of falling - immediate or within 3 months 0 No Secondary diagnosis 0 No Ambulatory aid None/bed rest/wheelchair/nurse 0 No Crutches/cane/walker 0 No  Furniture 0 No IV Access/Saline Lock 0 No Gait/Training Normal/bed rest/immobile 0 No Weak 0 No Impaired 0 No Mental Status Oriented to own ability 0 Yes Electronic Signature(s) Signed: 07/28/2016 4:23:06 PM By: Alejandro MullingPinkerton, Debra Entered By: Alejandro MullingPinkerton, Debra on 07/28/2016 10:50:38 Duris, Mickie HillierKENNETH E. ([id number]) -------------------------------------------------------------------------------- Foot Assessment Details Patient Name: [name], Daniel E. Date of Service: 07/28/2016 10:45 AM Medical Record Number: [id number] Patient Account Number: [id number] Date of Birth/Sex: 01/18/1951 (66 y.o. Male) Treating RN: Phillis HaggisPinkerton, Debi Primary Care Jeremyah Jelley: Wilhelmina McardleKENNEDY, LAUREN Other Clinician: Referring Ethelwyn Gilbertson: Wilhelmina McardleKENNEDY, LAUREN Treating Kingston Shawgo/Extender: Rudene ReBritto, Errol Weeks in Treatment: 0 Foot Assessment Items Site Locations + = Sensation present, - = Sensation absent, C = Callus, U = Ulcer R = Redness, W = Warmth, M = Maceration, PU = Pre-ulcerative lesion F = Fissure, S = Swelling, D = Dryness Assessment Right: Left: Other Deformity: No No Prior Foot Ulcer: No No Prior Amputation:  No No Charcot Joint: No No Ambulatory Status: Ambulatory Without Help Gait: Steady Electronic Signature(s) Signed: 07/28/2016 4:23:06 PM By: Alejandro MullingPinkerton, Debra Entered By: Alejandro MullingPinkerton, Debra on 07/28/2016 10:53:22 Hartmann, Mickie HillierKENNETH E. ([id number]) -------------------------------------------------------------------------------- Nutrition Risk Assessment Details Patient Name: [name], Daniel E. Date of Service: 07/28/2016 10:45 AM Medical Record Number: [id number] Patient Account Number: [id number] Date of Birth/Sex: 11/17/1950 [age] y.o. Male) Treating RN: Phillis HaggisPinkerton, Debi Primary Care Lariya Kinzie: Wilhelmina McardleKENNEDY, LAUREN Other Clinician: Referring Castella Lerner: Wilhelmina McardleKENNEDY, LAUREN Treating Analeia Ismael/Extender: Rudene ReBritto, Errol Weeks in Treatment: 0 Height (in): Weight (lbs): Body Mass Index (BMI): Nutrition Risk Assessment Items NUTRITION RISK SCREEN: I have an illness or condition that made me change the kind and/or 0 No amount of food I eat I eat fewer than two meals per day 0 No I eat few fruits and vegetables, or milk products 0 No I have three or more drinks of beer, liquor or wine almost every day 0 No I have tooth or mouth problems that make it hard for me to eat 0 No I don't always have enough money to buy the food I need 0 No I eat alone most of the time 0 No I take three or more different prescribed or over-the-counter drugs a 1 Yes day Without wanting to, I have lost or gained 10 pounds in the last six 0 No months I am not always physically able to shop, cook and/or feed myself 0 No Nutrition Protocols Good Risk Protocol Moderate Risk Protocol Electronic Signature(s) Signed: 07/28/2016 4:23:06 PM By: Alejandro MullingPinkerton, Debra Entered By: Alejandro MullingPinkerton, Debra on 07/28/2016 10:50:45

## 2016-07-30 NOTE — Progress Notes (Signed)
[name], [name] ([id number]) Visit Report for 07/28/2016 Chief Complaint Document Details Patient Name: [name], [name] Date of Service: 07/28/2016 10:45 AM Medical Record Number: [id number] Patient Account Number: [id number] Date of Birth/Sex: Dec 03, 1950 (65 y.o. Male) Treating RN: Phillis Haggis Primary Care Provider: Wilhelmina Mcardle Other Clinician: Referring Provider: Wilhelmina Mcardle Treating Provider/Extender: Rudene Re in Treatment: 0 Information Obtained from: Patient Chief Complaint Patients presents for treatment of an open diabetic ulcer to the left lower extremity which led on to a cellulitis and abscesses which were drained and this was about a week ago Electronic Signature(s) Signed: 07/28/2016 11:34:20 AM By: Evlyn Kanner MD, FACS Entered By: Evlyn Kanner on 07/28/2016 11:34:20 [name], [name] ([id number]) -------------------------------------------------------------------------------- HPI Details Patient Name: [name] Date of Service: 07/28/2016 10:45 AM Medical Record Number: [id number] Patient Account Number: [id number] Date of Birth/Sex: [birth year] (65 y.o. Male) Treating RN: Phillis Haggis Primary Care Provider: Wilhelmina Mcardle Other Clinician: Referring Provider: Wilhelmina Mcardle Treating Provider/Extender: Rudene Re in Treatment: 0 History of Present Illness Location: injury to the left lower extremity with blunt trauma which led to abscesses and these were drained Quality: Patient reports experiencing a dull pain to affected area(s). Severity: Patient states wound are getting worse. Duration: Patient has had the wound for < 2 weeks prior to presenting for treatment Timing: Pain in wound is Intermittent (comes and goes Context: The wound occurred when the patient had blunt trauma to the legs which 2 weeks later led to abscesses which drained and he has been on antibiotics Modifying Factors: Other treatment(s) tried include:IandD done and  put on broad-spectrum antibiotic Associated Signs and Symptoms: Patient reports having increase discharge. HPI Description: 66 year old gentleman who was seen at his PCPs office recently with a injury to the left leg which she had with the old 4 x 4 piece of wood about 2 weeks ago. Over a period of time he had some drainage and has been using some antibiotic ointment locally. he chews tobacco. when he was seen at the PCPs office he had an incision and drainage of her left lower extremity abscess which drained thick pus. His last hemoglobin A1c was 8.8%. he was put on Keflex and and doxycycline and given a tetanus toxoid. was medical history significant for hypertension, diabetes mellitus, left shoulder injury, smokeless tobacco use, obesity, low back pain. Electronic Signature(s) Signed: 07/28/2016 11:35:25 AM By: Evlyn Kanner MD, FACS Previous Signature: 07/28/2016 11:09:32 AM Version By: Evlyn Kanner MD, FACS Entered By: Evlyn Kanner on 07/28/2016 11:35:25 [name], [name] ([id number]) -------------------------------------------------------------------------------- Physical Exam Details Patient Name: [name] Date of Service: 07/28/2016 10:45 AM Medical Record Number: [id number] Patient Account Number: [id number] Date of Birth/Sex: 02/25/1951 (66 y.o. Male) Treating RN: Phillis Haggis Primary Care Provider: Wilhelmina Mcardle Other Clinician: Referring Provider: Wilhelmina Mcardle Treating Provider/Extender: Rudene Re in Treatment: 0 Constitutional . Pulse regular. Respirations normal and unlabored. Afebrile. . Eyes Nonicteric. Reactive to light. Ears, Nose, Mouth, and Throat Lips, teeth, and gums WNL.Marland Kitchen Moist mucosa without lesions. Neck supple and nontender. No palpable supraclavicular or cervical adenopathy. Normal sized without goiter. Respiratory WNL. No retractions.. Cardiovascular Pedal Pulses WNL, ABI on the left was 1.14. minimal lymphedema left lower  extremity. Chest Breasts symmetical and no nipple discharge.. Breast tissue WNL, no masses, lumps, or tenderness.. Gastrointestinal (GI) Abdomen without masses or tenderness.. No liver or spleen enlargement or tenderness.. Lymphatic No adneopathy. No adenopathy. No adenopathy. Musculoskeletal Adexa without tenderness or enlargement.. Digits and nails w/o clubbing, cyanosis, infection,  petechiae, ischemia, or inflammatory conditions.. Integumentary (Hair, Skin) No suspicious lesions. No crepitus or fluctuance. No peri-wound warmth or erythema. No masses.Marland Kitchen Psychiatric Judgement and insight Intact.. No evidence of depression, anxiety, or agitation.. Notes the patient has an area which was previously drained and has 2 openings which communicate on the left anterior shin area. No sharp debridement was required. The 2 smaller areas lower down on the leg which was superficial injuries and possibly drained furunculosis Electronic Signature(s) Signed: 07/28/2016 11:37:20 AM By: Evlyn Kanner MD, FACS Entered By: Evlyn Kanner on 07/28/2016 11:37:19 [name], [name] ([id number]) [name], [name] ([id number]) -------------------------------------------------------------------------------- Physician Orders Details Patient Name: [name] Date of Service: 07/28/2016 10:45 AM Medical Record Number: [id number] Patient Account Number: [id number] Date of Birth/Sex: Jun 11, 1950 (65 y.o. 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 o Cleanse wound with mild soap and water o May Shower, gently pat wound dry prior to applying new dressing. Wound #2 Left,Distal,Anterior Lower Leg o Clean wound with Normal Saline. o Cleanse wound with mild soap and water o May Shower, gently pat wound dry prior to applying new dressing. Wound #3 Left,Lateral Lower Leg o Clean wound with Normal Saline. o Cleanse wound with mild soap and water o May Shower, gently pat wound dry prior to applying new dressing. Anesthetic Wound #1 Left,Proximal,Anterior Lower Leg o Topical Lidocaine 4% cream applied to wound bed prior to debridement Wound #2 Left,Distal,Anterior Lower Leg o Topical Lidocaine 4% cream applied to wound bed prior to debridement Wound #3 Left,Lateral Lower Leg o Topical Lidocaine 4% cream applied to wound bed prior to debridement Skin Barriers/Peri-Wound Care Wound #1 Left,Proximal,Anterior Lower Leg o Skin Prep [name], [name] ([id number]) Wound #2 Left,Distal,Anterior Lower Leg o Skin Prep Wound #3 Left,Lateral Lower Leg o Skin Prep Primary Wound Dressing Wound #1 Left,Proximal,Anterior Lower Leg o Iodoform packing Gauze Wound #2 Left,Distal,Anterior Lower Leg o Medihoney gel Wound #3 Left,Lateral Lower Leg o Medihoney gel Secondary Dressing Wound #1 Left,Proximal,Anterior Lower Leg o Dry Gauze o Boardered Foam Dressing Wound #2 Left,Distal,Anterior Lower Leg o Dry Gauze o Other - coverlet band-aides Wound #3 Left,Lateral Lower Leg o Dry Gauze o Other - coverlet band-aides Dressing Change Frequency Wound #1 Left,Proximal,Anterior Lower Leg o Change dressing every day. Wound #2 Left,Distal,Anterior Lower Leg o Change dressing every day. Wound #3 Left,Lateral Lower Leg o Change dressing every  day. Follow-up Appointments Wound #1 Left,Proximal,Anterior Lower Leg o Return Appointment in 1 week. Wound #2 Left,Distal,Anterior Lower Leg o Return Appointment in 1 week. Wound #3 Left,Lateral Lower Leg Vacha, [name]. ([id number]) o Return Appointment in 1 week. Edema Control Wound #1 Left,Proximal,Anterior Lower Leg o Elevate legs to the level of the heart and pump ankles as often as possible Wound #2 Left,Distal,Anterior Lower Leg o Elevate legs to the level of the heart and pump ankles as often as possible Wound #3 Left,Lateral Lower Leg o Elevate legs to the level of the heart and pump ankles as often as possible Additional Orders / Instructions  Wound #1 Left,Proximal,Anterior Lower Leg o Increase protein intake. Wound #2 Left,Distal,Anterior Lower Leg o Increase protein intake. Wound #3 Left,Lateral Lower Leg o Increase protein intake. Electronic Signature(s) Signed: 07/28/2016 3:54:55 PM By: Evlyn Kanner MD, FACS Signed: 07/28/2016 4:23:06 PM By: Alejandro Mulling Entered By: Alejandro Mulling on 07/28/2016 11:47:57 [name], [name] ([id number]) -------------------------------------------------------------------------------- Problem List Details Patient Name: [name] Date of Service: 07/28/2016 10:45 AM Medical Record Number: [id number] Patient Account Number: [id number] Date of Birth/Sex: 09-03-1950 (65 y.o. Male) Treating RN: Phillis Haggis Primary Care Provider: Wilhelmina Mcardle Other Clinician: Referring Provider: Wilhelmina Mcardle Treating Provider/Extender: Rudene Re in Treatment: 0 Active Problems ICD-10 Encounter Code Description Active Date Diagnosis E11.622 Type 2 diabetes mellitus with other skin ulcer 07/28/2016 Yes L03.116 Cellulitis of left lower limb 07/28/2016 Yes L97.222 Non-pressure chronic ulcer of left calf with fat layer 07/28/2016 Yes exposed F17.229 Nicotine dependence, chewing tobacco, with unspecified 07/28/2016  Yes nicotine-induced disorders Inactive Problems Resolved Problems Electronic Signature(s) Signed: 07/28/2016 11:33:46 AM By: Evlyn Kanner MD, FACS Entered By: Evlyn Kanner on 07/28/2016 11:33:46 Ousley, [name] ([id number]) -------------------------------------------------------------------------------- Progress Note Details Patient Name: [name] Date of Service: 07/28/2016 10:45 AM Medical Record Number: [id number] Patient Account Number: [id number] Date of Birth/Sex: 1950-07-17 (66 y.o. Male) Treating RN: Ashok Cordia, Debi Primary Care Provider: Wilhelmina Mcardle Other Clinician: Referring Provider: Wilhelmina Mcardle Treating Provider/Extender: Rudene Re in Treatment: 0 Subjective Chief Complaint Information obtained from Patient Patients presents for treatment of an open diabetic ulcer to the left lower extremity which led on to a cellulitis and abscesses which were drained and this was about a week ago History of Present Illness (HPI) The following HPI elements were documented for the patient's wound: Location: injury to the left lower extremity with blunt trauma which led to abscesses and these were drained Quality: Patient reports experiencing a dull pain to affected area(s). Severity: Patient states wound are getting worse. Duration: Patient has had the wound for < 2 weeks prior to presenting for treatment Timing: Pain in wound is Intermittent (comes and goes Context: The wound occurred when the patient had blunt trauma to the legs which 2 weeks later led to abscesses which drained and he has been on antibiotics Modifying Factors: Other treatment(s) tried include:IandD done and put on broad-spectrum antibiotic Associated Signs and Symptoms: Patient reports having increase discharge. 66 year old gentleman who was seen at his PCPs office recently with a injury to the left leg which she had with the old 4 x 4 piece of wood about 2 weeks ago. Over a period of time he  had some drainage and has been using some antibiotic ointment locally. he chews tobacco. when he was seen at the PCPs office he had an incision and drainage of her left lower extremity abscess which drained thick pus. His last hemoglobin A1c was 8.8%. he was put on Keflex and and doxycycline and given a tetanus toxoid. was medical history significant for hypertension, diabetes mellitus, left shoulder injury, smokeless tobacco use, obesity, low back pain. Wound History Patient presents with 1 open wound that has been present for approximately 2 weeks. Patient has been treating wound in the following manner: saline. Laboratory tests have not been performed in the last month. Patient reportedly has not tested positive for an antibiotic resistant organism. Patient reportedly has not tested positive for osteomyelitis. Patient reportedly has not had testing performed to evaluate circulation in the legs. Patient experiences the following problems associated with their wounds: infection, swelling. Patient History Information  obtained from Patient. [name], [name] ([id number]) Allergies prednisone, Invokana Family History Cancer - Father, Heart Disease - Siblings, Siblings. Social History Never smoker, Marital Status - Married, Alcohol Use - Never, Drug Use - No History, Caffeine Use - Daily. Medical History Eyes Patient has history of Cataracts Cardiovascular Patient has history of Hypertension Endocrine Patient has history of Type II Diabetes Musculoskeletal Patient has history of Gout Patient is treated with Insulin, Oral Agents. Blood sugar is not tested. Review of Systems (ROS) Constitutional Symptoms (General Health) The patient has no complaints or symptoms. Ear/Nose/Mouth/Throat [facility] Hematologic/Lymphatic The patient has no complaints or symptoms. Respiratory The patient has no complaints or symptoms. Cardiovascular hyperlipidemia Gastrointestinal GERD Genitourinary The  patient has no complaints or symptoms. Immunological The patient has no complaints or symptoms. Integumentary (Skin) Complains or has symptoms of Wounds. Neurologic chronic headaches Oncologic The patient has no complaints or symptoms. Psychiatric The patient has no complaints or symptoms. [name], [name] ([id number]) Medications metformin 1,000 mg tablet oral 1 1 tablet oral two times daily with meals Tradjenta 5 mg tablet oral 1 1 tablet oral daily lisinopril 40 mg tablet oral 1 1 tablet oral daily amlodipine 2.5 mg tablet oral 1 1 tablet oral daily cephalexin 500 mg capsule oral 1 1 capsule oral four times daily colchicine 0.6 mg tablet oral 1 1 tablet oral as needed allopurinol 100 mg tablet oral 1 1 tablet oral daily Lantus Solostar U-100 Insulin 100 unit/mL (3 mL) subcutaneous pen subcutaneous insulin pen subcutaneous inject 10 units daily at 10 pm. fluticasone 50 mcg/actuation nasal spray,suspension nasal 2 2 sprays, suspension nasal into both nostrils daily naproxen 500 mg tablet oral 1 1 tablet oral two times daily with a meal omeprazole 20 mg capsule,delayed release oral 1 1 capsule,delayed release(DR/EC) oral daily doxycycline hyclate 100 mg tablet oral 1 1 tablet oral two times daily Objective Constitutional Pulse regular. Respirations normal and unlabored. Afebrile. Vitals Time Taken: 10:40 AM, Height: 71 in, Source: Stated, Weight: 239.8 lbs, Source: Measured, BMI: 33.4, Temperature: 98.6 F, Pulse: 70 bpm, Respiratory Rate: 18 breaths/min, Blood Pressure: 121/70 mmHg, Pulse Oximetry: 99 %. Eyes Nonicteric. Reactive to light. Ears, Nose, Mouth, and Throat Lips, teeth, and gums WNL.Marland Kitchen Moist mucosa without lesions. Neck supple and nontender. No palpable supraclavicular or cervical adenopathy. Normal sized without goiter. Respiratory WNL. No retractions.. Cardiovascular Pedal Pulses WNL, ABI on the left was 1.14. minimal lymphedema left lower  extremity. Chest Breasts symmetical and no nipple discharge.. Breast tissue WNL, no masses, lumps, or tenderness.[name] ([id number]) Gastrointestinal (GI) Abdomen without masses or tenderness.. No liver or spleen enlargement or tenderness.. Lymphatic No adneopathy. No adenopathy. No adenopathy. Musculoskeletal Adexa without tenderness or enlargement.. Digits and nails w/o clubbing, cyanosis, infection, petechiae, ischemia, or inflammatory conditions.Marland Kitchen Psychiatric Judgement and insight Intact.. No evidence of depression, anxiety, or agitation.. General Notes: the patient has an area which was previously drained and has 2 openings which communicate on the left anterior shin area. No sharp debridement was required. The 2 smaller areas lower down on the leg which was superficial injuries and possibly drained furunculosis Integumentary (Hair, Skin) No suspicious lesions. No crepitus or fluctuance. No peri-wound warmth or erythema. No masses.. Wound #1 status is Open. Original cause of wound was Trauma. The wound is located on the Left,Proximal,Anterior Lower Leg. The wound measures 2.2cm length x 1.4cm width x 0.2cm depth; 2.419cm^2 area and 0.484cm^3 volume. There is no tunneling noted, however, there is undermining starting at 12:00 and ending at  9:00 with a maximum distance of 1.9cm. There is a large amount of serosanguineous drainage noted. The wound margin is distinct with the outline attached to the wound base. There is small (1-33%) red granulation within the wound bed. There is a large (67-100%) amount of necrotic tissue within the wound bed including Adherent Slough. The periwound skin appearance exhibited: Erythema. The surrounding wound skin color is noted with erythema which is circumferential. Periwound temperature was noted as No Abnormality. The periwound has tenderness on palpation. Wound #2 status is Open. Original cause of wound was Trauma. The wound is located on  the [facility] Lower Leg. The wound measures 0.7cm length x 1cm width x 0.1cm depth; 0.55cm^2 area and 0.055cm^3 volume. There is no tunneling noted, however, there is undermining starting at 11:00 and ending at 9:00 with a maximum distance of 0.8cm. There is a large amount of purulent drainage noted. The wound margin is distinct with the outline attached to the wound base. There is medium (34-66%) red granulation within the wound bed. There is a medium (34-66%) amount of necrotic tissue within the wound bed including Adherent Slough. The periwound skin appearance exhibited: Erythema. The surrounding wound skin color is noted with erythema which is circumferential. Periwound temperature was noted as No Abnormality. The periwound has tenderness on palpation. Wound #3 status is Open. Original cause of wound was Trauma. The wound is located on the Left,Lateral Lower Leg. The wound measures 0.4cm length x 0.3cm width x 0.1cm depth; 0.094cm^2 area and 0.009cm^3 volume. There is no tunneling or undermining noted. There is a large amount of serosanguineous drainage noted. The wound margin is distinct with the outline attached to the wound base. There is large (67-100%) red granulation within the wound bed. There is no necrotic tissue within the wound bed. The periwound skin appearance exhibited: Erythema. The surrounding wound skin color is noted with erythema which is circumferential. Periwound temperature was noted as No Abnormality. The periwound has tenderness on palpation. [name], [name] ([id number]) Assessment Active Problems ICD-10 E11.622 - Type 2 diabetes mellitus with other skin ulcer L03.116 - Cellulitis of left lower limb L97.222 - Non-pressure chronic ulcer of left calf with fat layer exposed F17.229 - Nicotine dependence, chewing tobacco, with unspecified nicotine-induced disorders 66 year old patient who is fairly uncontrolled diabetes mellitus with last hemoglobin A1c  being 8.8%, had a cellulitis and abscess drain from his left lower extremity. After review of his wounds have recommended: 1. Iodoform packing with 1/4 inch iodoform into each of these openings on the left shin 2. Medihoney to be applied to the 2 lower wounds and covered with a bordered foam 3. urged him to have good control of his diabetes mellitus 4. Spent over 3 minutes discussing with him the need to completely give up doing tobacco and I have discussed the risk benefits and alternatives and the methodology to do this 5. Regular visits to the wound center Plan Wound Cleansing: Wound #1 Left,Proximal,Anterior Lower Leg: Clean wound with Normal Saline. Cleanse wound with mild soap and water May Shower, gently pat wound dry prior to applying new dressing. Wound #2 Left,Distal,Anterior Lower Leg: Clean wound with Normal Saline. Cleanse wound with mild soap and water May Shower, gently pat wound dry prior to applying new dressing. Wound #3 Left,Lateral Lower Leg: Clean wound with Normal Saline. Cleanse wound with mild soap and water May Shower, gently pat wound dry prior to applying new dressing. Anesthetic: Wound #1 Left,Proximal,Anterior Lower Leg: Topical Lidocaine 4% cream applied to wound  bed prior to debridement Wound #2 Left,Distal,Anterior Lower Leg: Topical Lidocaine 4% cream applied to wound bed prior to debridement Wound #3 Left,Lateral Lower Leg: Topical Lidocaine 4% cream applied to wound bed prior to debridement Skin Barriers/Peri-Wound Care: [name], [name] ([id number]) Wound #1 Left,Proximal,Anterior Lower Leg: Skin Prep Wound #2 Left,Distal,Anterior Lower Leg: Skin Prep Wound #3 Left,Lateral Lower Leg: Skin Prep Primary Wound Dressing: Wound #1 Left,Proximal,Anterior Lower Leg: Iodoform packing Gauze Wound #2 Left,Distal,Anterior Lower Leg: Medihoney gel Wound #3 Left,Lateral Lower Leg: Medihoney gel Secondary Dressing: Wound #1 Left,Proximal,Anterior Lower  Leg: Dry Gauze Boardered Foam Dressing Wound #2 Left,Distal,Anterior Lower Leg: Dry Gauze Other - coverlet band-aides Wound #3 Left,Lateral Lower Leg: Dry Gauze Other - coverlet band-aides Dressing Change Frequency: Wound #1 Left,Proximal,Anterior Lower Leg: Change dressing every day. Wound #2 Left,Distal,Anterior Lower Leg: Change dressing every day. Wound #3 Left,Lateral Lower Leg: Change dressing every day. Follow-up Appointments: Wound #1 Left,Proximal,Anterior Lower Leg: Return Appointment in 1 week. Wound #2 Left,Distal,Anterior Lower Leg: Return Appointment in 1 week. Wound #3 Left,Lateral Lower Leg: Return Appointment in 1 week. Edema Control: Wound #1 Left,Proximal,Anterior Lower Leg: Elevate legs to the level of the heart and pump ankles as often as possible Wound #2 Left,Distal,Anterior Lower Leg: Elevate legs to the level of the heart and pump ankles as often as possible Wound #3 Left,Lateral Lower Leg: Elevate legs to the level of the heart and pump ankles as often as possible Additional Orders / Instructions: Wound #1 Left,Proximal,Anterior Lower Leg: Increase protein intake. Wound #2 Left,Distal,Anterior Lower Leg: Increase protein intake. Wound #3 Left,Lateral Lower Leg: Increase protein intake. JAYSON, WATERHOUSE ([id number]) 66 year old patient who is fairly uncontrolled diabetes mellitus with last hemoglobin A1c being 8.8%, had a cellulitis and abscess drain from his left lower extremity. After review of his wounds have recommended: 1. Iodoform packing with 1/4 inch iodoform into each of these openings on the left shin 2. Medihoney to be applied to the 2 lower wounds and covered with a bordered foam 3. urged him to have good control of his diabetes mellitus 4. Spent over 3 minutes discussing with him the need to completely give up doing tobacco and I have discussed the risk benefits and alternatives and the methodology to do this 5. Regular visits to the  wound center Electronic Signature(s) Signed: 07/28/2016 3:58:01 PM By: Evlyn Kanner MD, FACS Previous Signature: 07/28/2016 11:39:38 AM Version By: Evlyn Kanner MD, FACS Entered By: Evlyn Kanner on 07/28/2016 15:58:00 Kastens, [name] ([id number]) -------------------------------------------------------------------------------- ROS/PFSH Details Patient Name: [name] Date of Service: 07/28/2016 10:45 AM Medical Record Number: [id number] Patient Account Number: [id number] Date of Birth/Sex: 1950-08-08 (66 y.o. Male) Treating RN: Phillis Haggis Primary Care Provider: Wilhelmina Mcardle Other Clinician: Referring Provider: Wilhelmina Mcardle Treating Provider/Extender: Rudene Re in Treatment: 0 Information Obtained From Patient Wound History Do you currently have one or more open woundso Yes How many open wounds do you currently haveo 1 Approximately how long have you had your woundso 2 weeks How have you been treating your wound(s) until nowo saline Has your wound(s) ever healed and then re-openedo No Have you had any lab work done in the past montho No Have you tested positive for an antibiotic resistant organism (MRSA, VRE)o No Have you tested positive for osteomyelitis (bone infection)o No Have you had any tests for circulation on your legso No Have you had other problems associated with your woundso Infection, Swelling Integumentary (Skin) Complaints and Symptoms: Positive for: Wounds Constitutional Symptoms (General Health)  Complaints and Symptoms: No Complaints or Symptoms Eyes Medical History: Positive for: Cataracts Ear/Nose/Mouth/Throat Complaints and Symptoms: Review of System Notes: HOH Hematologic/Lymphatic Complaints and Symptoms: No Complaints or Symptoms Respiratory Brazie, Daniel HillierKENNETH E. ([id number]) Complaints and Symptoms: No Complaints or Symptoms Cardiovascular Complaints and Symptoms: Review of System Notes: hyperlipidemia Medical  History: Positive for: Hypertension Gastrointestinal Complaints and Symptoms: Review of System Notes: GERD Endocrine Medical History: Positive for: Type II Diabetes Time with diabetes: 10 years Treated with: Insulin, Oral agents Blood sugar tested every day: No Genitourinary Complaints and Symptoms: No Complaints or Symptoms Immunological Complaints and Symptoms: No Complaints or Symptoms Musculoskeletal Medical History: Positive for: Gout Neurologic Complaints and Symptoms: Review of System Notes: chronic headaches Oncologic Complaints and Symptoms: No Complaints or Symptoms Weidinger, Daniel HillierKENNETH E. ([id number]) Psychiatric Complaints and Symptoms: No Complaints or Symptoms HBO Extended History Items Eyes: Cataracts Immunizations Pneumococcal Vaccine: Received Pneumococcal Vaccination: No Family and Social History Cancer: Yes - Father; Heart Disease: Yes - Siblings, Siblings; Never smoker; Marital Status - Married; Alcohol Use: Never; Drug Use: No History; Caffeine Use: Daily; Financial Concerns: No; Food, Clothing or Shelter Needs: No; Support System Lacking: No; Transportation Concerns: No; Advanced Directives: No; Patient does not want information on Advanced Directives; Do not resuscitate: No; Living Will: No; Medical Power of Attorney: No Physician Affirmation I have reviewed and agree with the above information. Electronic Signature(s) Signed: 07/28/2016 3:54:55 PM By: Evlyn KannerBritto, Karissa Meenan MD, FACS Signed: 07/28/2016 4:23:06 PM By: Alejandro MullingPinkerton, Debra Entered By: Evlyn KannerBritto, Walton Digilio on 07/28/2016 10:58:43 Palos, Daniel HillierKENNETH E. ([id number]) -------------------------------------------------------------------------------- SuperBill Details Patient Name: Daniel KinsmanLAW, Cale E. Date of Service: 07/28/2016 Medical Record Number: [id number] Patient Account Number: [id number] Date of Birth/Sex: 01/19/1951 (65 y.o. Male) Treating RN: Phillis HaggisPinkerton, Debi Primary Care Provider: Wilhelmina McardleKENNEDY, LAUREN Other  Clinician: Referring Provider: Wilhelmina McardleKENNEDY, LAUREN Treating Provider/Extender: Rudene ReBritto, [place] Kievit Weeks in Treatment: 0 Diagnosis Coding ICD-10 Codes Code Description E11.622 Type 2 diabetes mellitus with other skin ulcer L03.116 Cellulitis of left lower limb L97.222 Non-pressure chronic ulcer of left calf with fat layer exposed F17.229 Nicotine dependence, chewing tobacco, with unspecified nicotine-induced disorders Facility Procedures CPT4 Code: 6295284176100140 Description: [id number] - WOUND CARE VISIT-LEV 5 EST PT Modifier: Quantity: 1 Physician Procedures CPT4: Description Modifier Quantity Code [id number] 99204 - WC PHYS LEVEL 4 - NEW PT 1 ICD-10 Description Diagnosis E11.622 Type 2 diabetes mellitus with other skin ulcer L03.116 Cellulitis of left lower limb L97.222 Non-pressure chronic ulcer of left calf  with fat layer exposed F17.229 Nicotine dependence, chewing tobacco, with unspecified nicotine-induced disorders Electronic Signature(s) Signed: 07/28/2016 3:54:55 PM By: Evlyn KannerBritto, Zamani Crocker MD, FACS Signed: 07/28/2016 4:23:06 PM By: Alejandro MullingPinkerton, Debra Previous Signature: 07/28/2016 11:39:57 AM Version By: Evlyn KannerBritto, Migdalia Olejniczak MD, FACS Entered By: Alejandro MullingPinkerton, Debra on 07/28/2016 13:02:08

## 2016-08-01 ENCOUNTER — Ambulatory Visit: Payer: Managed Care, Other (non HMO) | Admitting: Nurse Practitioner

## 2016-08-04 ENCOUNTER — Ambulatory Visit: Payer: Commercial Indemnity | Admitting: Surgery

## 2016-08-17 ENCOUNTER — Ambulatory Visit: Payer: Commercial Indemnity | Admitting: Family Medicine

## 2016-08-18 ENCOUNTER — Other Ambulatory Visit: Payer: Self-pay | Admitting: Family Medicine

## 2016-08-18 DIAGNOSIS — E119 Type 2 diabetes mellitus without complications: Secondary | ICD-10-CM

## 2016-08-18 DIAGNOSIS — I10 Essential (primary) hypertension: Secondary | ICD-10-CM

## 2016-08-18 DIAGNOSIS — K219 Gastro-esophageal reflux disease without esophagitis: Secondary | ICD-10-CM

## 2016-08-18 DIAGNOSIS — M1000 Idiopathic gout, unspecified site: Secondary | ICD-10-CM

## 2016-08-18 DIAGNOSIS — J3089 Other allergic rhinitis: Secondary | ICD-10-CM

## 2016-08-18 MED ORDER — LISINOPRIL 40 MG PO TABS: 40.0000 mg | ORAL_TABLET | Freq: Every day | ORAL | 3 refills | Status: DC

## 2016-08-18 MED ORDER — ALLOPURINOL 100 MG PO TABS: 100.0000 mg | ORAL_TABLET | Freq: Every day | ORAL | 3 refills | Status: DC

## 2016-08-18 MED ORDER — OMEPRAZOLE 20 MG PO CPDR: 20.0000 mg | DELAYED_RELEASE_CAPSULE | Freq: Every day | ORAL | 3 refills | Status: DC

## 2016-08-18 MED ORDER — AMLODIPINE BESYLATE 2.5 MG PO TABS: 2.5000 mg | ORAL_TABLET | Freq: Every day | ORAL | 3 refills | Status: DC

## 2016-08-18 MED ORDER — COLCHICINE 0.6 MG PO TABS: 0.6000 mg | ORAL_TABLET | ORAL | 3 refills | Status: DC | PRN

## 2016-08-18 MED ORDER — FLUTICASONE PROPIONATE 50 MCG/ACT NA SUSP: 2.0000 | Freq: Every day | NASAL | 3 refills | Status: DC

## 2016-08-18 MED ORDER — LINAGLIPTIN 5 MG PO TABS: 5.0000 mg | ORAL_TABLET | Freq: Every day | ORAL | 3 refills | Status: DC

## 2016-08-18 MED ORDER — INSULIN GLARGINE 100 UNIT/ML SOLOSTAR PEN: 10.0000 [IU] | PEN_INJECTOR | Freq: Every day | SUBCUTANEOUS | 3 refills | Status: DC

## 2016-08-18 MED ORDER — METFORMIN HCL ER (OSM) 1000 MG PO TB24: ORAL_TABLET | ORAL | 3 refills | Status: DC

## 2016-08-18 NOTE — Progress Notes (Signed)
Pharm[facility].  Completed paperwork, #9 new rx sent e-scripts #90 day supply +3 refills.  Saralyn PilarAlexander Karamalegos, DO [facility] 08/18/2016, 2:11 PM

## 2016-08-19 ENCOUNTER — Other Ambulatory Visit: Payer: Self-pay | Admitting: Family Medicine

## 2016-08-19 ENCOUNTER — Encounter: Payer: Self-pay | Admitting: Family Medicine

## 2016-08-19 ENCOUNTER — Ambulatory Visit (INDEPENDENT_AMBULATORY_CARE_PROVIDER_SITE_OTHER): Payer: Managed Care, Other (non HMO) | Admitting: Family Medicine

## 2016-08-19 VITALS — BP 120/65 | HR 64 | Temp 98.1°F | Resp 16 | Ht 71.0 in | Wt 237.0 lb

## 2016-08-19 DIAGNOSIS — E1121 Type 2 diabetes mellitus with diabetic nephropathy: Secondary | ICD-10-CM

## 2016-08-19 DIAGNOSIS — E1169 Type 2 diabetes mellitus with other specified complication: Secondary | ICD-10-CM | POA: Diagnosis not present

## 2016-08-19 DIAGNOSIS — Z794 Long term (current) use of insulin: Secondary | ICD-10-CM | POA: Diagnosis not present

## 2016-08-19 DIAGNOSIS — E1165 Type 2 diabetes mellitus with hyperglycemia: Secondary | ICD-10-CM | POA: Diagnosis not present

## 2016-08-19 DIAGNOSIS — E785 Hyperlipidemia, unspecified: Secondary | ICD-10-CM

## 2016-08-19 DIAGNOSIS — IMO0002 Reserved for concepts with insufficient information to code with codable children: Secondary | ICD-10-CM

## 2016-08-19 NOTE — Progress Notes (Addendum)
Subjective:    Patient ID: [name], male    DOB: 1950/10/29, 66 y.o.   MRN: [id number]  [name] is a 66 y.o. male presenting on 08/19/2016 for Diabetes and URI   HPI   CHRONIC DM, Type 2, complicated with CKD-III Reports expected blood sugar to better today, he has tried to improve lifestyle and lost nearly 20 lbs in 3 months. CBGs: Infrequently checking CBG 1x every few days, does not have written log or glucometer. No average known, but has had several readings in evening up to 240. Meds: Lantus 12 units nightly PM (increased from 10 on his own over past several months, no longer increasing) - Previously on Glimepiride 4mg  BID, decreased to 1 daily then stopped in 03/2016 and switched - Failed Invokana in 2016, went to hospital with Acute Renal Failure, also was on Crestor at that time Reports good compliance. Tolerating well w/o side-effects Currently on ACEi Lifestyle: - Diet (reduced portion sizes, still eating carbs with sandwiches, biscuit, fries, some erratic eating) - Exercise (limited regular exercise due to work) Denies hypoglycemia, polyuria, visual changes, numbness or tingling.  HYPERLIPIDEMIA: - Unsure last lipid panel, fasting today for lipid panel check - Currently not on any cholesterol medication - Previously on Crestor stopped due to acute renal failure with invokana, unsure if statin intolerance - Not on ASA daily   Recent URI, seems to be improving now. No new concerns.  Social History  Substance Use Topics  . Smoking status: Passive Smoke Exposure - Never Smoker    Packs/day: 1.00    Years: 39.00  . Smokeless tobacco: Current User    Types: Chew     Comment: wife states patient never smoked cigarettes, just chewing tobacco  . Alcohol use No    Review of Systems Per HPI unless specifically indicated above     Objective:    BP 120/65 (BP Location: Right Arm, Patient Position: Sitting, Cuff Size: Large)   Pulse 64   Temp 98.1  F (36.7 C) (Oral)   Resp 16   Ht 5\' 11"  (1.803 m)   Wt 237 lb (107.5 kg)   SpO2 100%   BMI 33.05 kg/m   Wt Readings from Last 3 Encounters:  08/19/16 237 lb (107.5 kg)  07/25/16 245 lb (111.1 kg)  05/30/16 254 lb (115.2 kg)    Physical Exam  Constitutional: He is oriented to person, place, and time. He appears well-developed and well-nourished. No distress.  Well-appearing, comfortable, cooperative  HENT:  Head: Normocephalic and atraumatic.  Mouth/Throat: Oropharynx is clear and moist.  Eyes: Conjunctivae are normal.  Neck: Normal range of motion. Neck supple.  Cardiovascular: Normal rate, regular rhythm, normal heart sounds and intact distal pulses.   No murmur heard. Pulmonary/Chest: Effort normal and breath sounds normal. No respiratory distress. He has no wheezes. He has no rales.  Musculoskeletal: He exhibits no edema.  Neurological: He is alert and oriented to person, place, and time.  Skin: Skin is warm and dry. No rash noted. He is not diaphoretic. No erythema.  Psychiatric: He has a normal mood and affect. His behavior is normal.  Nursing note and vitals reviewed.       Assessment & Plan:   Problem List Items Addressed This Visit    Hyperlipidemia associated with type 2 diabetes mellitus (HCC)    No recent lipids available, concern with obesity and DM2 uncontrolled. Some wt loss now but worsening DM  Plan: 1. Check fasting lipids -  he is not on ASA, Statin (intolerance to crestor before) - discuss ASCVD risk next visit      Relevant Orders   Lipid panel   Diabetes type 2, uncontrolled (HCC) - Primary    Concern with recent worsening A1c now 10.6, up from last 8.8, has had fluctuating range in past. Confusing since patient has lost nearly 20 lbs in 3 months with lifestyle changes and on insulin monotherapy Lantus for past >4 months (however he is not adhering to titration) and not checking CBGs. - Complication CKD-III - Failed Invokana, and concern Metformin  given CKD  Plan: 1. Possible inaccurate POC A1c given wt loss and insulin, however limited info due to no CBG records, cannot adjust insulin or determine if A1c is accurate. Will check serum A1c today, and follow-up that result, will not save POC result at this time. 2. Continue Lantus 12u nightly for now - advised to start titration up by 1 unit every 3 days if fasting CBG in AM >150 consistently 3. Needs to start checking CBG 1-3x daily write down log, hand out given, or else I will have very difficult time titrating his lantus 4. Discussed that if A1c truly is >10, will need to add 2nd agent, likely injectable GLP1, prefer once weekly either Trulicity or Bydureon BCise, may check ins preference 5. Also check Chemistry, Lipids - he is not on ASA, Statin (intolerance to crestor before) - discuss ASCVD risk next visit 6. Follow-up 6 weeks to follow-up CBGs and then 6 more weeks for A1c      Relevant Orders   Hemoglobin A1c   BASIC METABOLIC PANEL WITH GFR   Lipid panel      No orders of the defined types were placed in this encounter.    Follow up plan: Return in about 6 weeks (around 09/30/2016) for Diabetes, sugar log / med adjust.  Saralyn PilarAlexander Pina Sirianni, DO [facility] 08/19/2016, 9:13 AM

## 2016-08-19 NOTE — Assessment & Plan Note (Signed)
No recent lipids available, concern with obesity and DM2 uncontrolled. Some wt loss now but worsening DM  Plan: 1. Check fasting lipids - he is not on ASA, Statin (intolerance to crestor before) - discuss ASCVD risk next visit

## 2016-08-19 NOTE — Assessment & Plan Note (Signed)
Concern with recent worsening A1c now 10.6, up from last 8.8, has had fluctuating range in past. Confusing since patient has lost nearly 20 lbs in 3 months with lifestyle changes and on insulin monotherapy Lantus for past >4 months (however he is not adhering to titration) and not checking CBGs. - Complication CKD-III - Failed Invokana, and concern Metformin given CKD  Plan: 1. Possible inaccurate POC A1c given wt loss and insulin, however limited info due to no CBG records, cannot adjust insulin or determine if A1c is accurate. Will check serum A1c today, and follow-up that result, will not save POC result at this time. 2. Continue Lantus 12u nightly for now - advised to start titration up by 1 unit every 3 days if fasting CBG in AM >150 consistently 3. Needs to start checking CBG 1-3x daily write down log, hand out given, or else I will have very difficult time titrating his lantus 4. Discussed that if A1c truly is >10, will need to add 2nd agent, likely injectable GLP1, prefer once weekly either Trulicity or Bydureon BCise, may check ins preference 5. Also check Chemistry, Lipids - he is not on ASA, Statin (intolerance to crestor before) - discuss ASCVD risk next visit 6. Follow-up 6 weeks to follow-up CBGs and then 6 more weeks for A1c

## 2016-08-19 NOTE — Patient Instructions (Signed)
Thank you for coming to the clinic today.  1. A1c today 10.6, elevated compared to last one 8.8, i'm concerned that this either is inaccurate or diabetes is not as well controlled as we hoped.  Congratulations on the weight loss, but need to keep working on lifestyle. Try to eat better food choices, less carbs, and do some regular walking exercise  For now no change, continue same medications, Lantus 12 units in evening. We may increase this in future.  Increase by 1 unit once every 3 days if fasting blood sugar is in the morning >150. Stop at 20 units if you reach this dose.  We will re-check the A1c today and kidney function. Will let you know next week if we need to add another injectable diabetes medication.  Trulicity (Dulaglutide) - once weekly injections Bydureon BCise (Exenatide)  Eat at least 3 meals and 1-2 snacks per day (don't skip breakfast).  Aim for no more than 5 hours between eating. - Tip: If you go >5 hours without eating and become very hungry, your body will supply it's own resources temporarily and you can gain extra weight when you eat.  The 5 Minute Rule of Exercise - Promise yourself to at least do 5 minutes of exercise (make sure you time it), and if at the end of 5 minutes (this is the hardest part of the work-out), if you still feel like you want stop (or not motivated to continue) then allow yourself to stop. Otherwise, more often than not you will feel encouraged that you can continue for a little while longer or even more!  Diet Recommendations for Diabetes   REDUCE Starchy (carb) foods include: Bread, rice, pasta, potatoes, corn, crackers, bagels, muffins, all baked goods.   Protein foods include: Meat, fish, poultry, eggs, dairy foods, and beans such as pinto and kidney beans (beans also provide carbohydrate).   1. Eat at least 3 meals and 1-2 snacks per day. Never go more than 4-5 hours while awake without eating.   2. Limit starchy foods to TWO per meal  and ONE per snack. ONE portion of a starchy  food is equal to the following:   - ONE slice of bread (or its equivalent, such as half of a hamburger bun).   - 1/2 cup of a "scoopable" starchy food such as potatoes or rice.   - 1 OUNCE (28 grams) of starchy snacks (crackers or pretzels, look on label).   - 15 grams of carbohydrate as shown on food label.   3. Both lunch and dinner should include a protein food, a carb food, and vegetables.   - Obtain twice as many veg's as protein or carbohydrate foods for both lunch and dinner.   - Try to keep frozen veg's on hand for a quick vegetable serving.     - Fresh or frozen veg's are best.   4. Breakfast should always include protein.     Please schedule a follow-up appointment with Dr. Althea CharonKaramalegos in 6 weeks for Diabetes, sugar log / med adjust  If you have any other questions or concerns, please feel free to call the clinic or send a message through MyChart. You may also schedule an earlier appointment if necessary.  Daniel PilarAlexander Rodarius Kichline, DO [facility]

## 2016-08-20 LAB — BASIC METABOLIC PANEL
BUN/Creatinine Ratio: 12 (ref 10–24)
BUN: 15 mg/dL (ref 8–27)
CO2: 23 mmol/L (ref 18–29)
Calcium: 10.4 mg/dL — ABNORMAL HIGH (ref 8.6–10.2)
Chloride: 97 mmol/L (ref 96–106)
Creatinine, Ser: 1.28 mg/dL — ABNORMAL HIGH (ref 0.76–1.27)
GFR calc Af Amer: 68 mL/min/{1.73_m2} (ref >59)
GFR calc non Af Amer: 59 mL/min/{1.73_m2} — ABNORMAL LOW (ref >59)
Glucose: 232 mg/dL — ABNORMAL HIGH (ref 65–99)
Potassium: 5.2 mmol/L (ref 3.5–5.2)
Sodium: 136 mmol/L (ref 134–144)

## 2016-08-20 LAB — LIPID PANEL W/O CHOL/HDL RATIO
Cholesterol, Total: 205 mg/dL — ABNORMAL HIGH (ref 100–199)
HDL: 42 mg/dL (ref >39)
LDL Calculated: 133 mg/dL — ABNORMAL HIGH (ref 0–99)
Triglycerides: 149 mg/dL (ref 0–149)
VLDL Cholesterol Cal: 30 mg/dL (ref 5–40)

## 2016-08-20 LAB — HGB A1C W/O EAG: Hgb A1c MFr Bld: 10.9 % — ABNORMAL HIGH (ref 4.8–5.6)

## 2016-08-25 ENCOUNTER — Encounter: Payer: Self-pay | Admitting: Family Medicine

## 2016-08-25 ENCOUNTER — Ambulatory Visit (INDEPENDENT_AMBULATORY_CARE_PROVIDER_SITE_OTHER): Payer: Managed Care, Other (non HMO) | Admitting: Family Medicine

## 2016-08-25 VITALS — BP 127/66 | HR 72 | Temp 98.6°F | Resp 16 | Ht 71.0 in | Wt 235.0 lb

## 2016-08-25 DIAGNOSIS — E1165 Type 2 diabetes mellitus with hyperglycemia: Secondary | ICD-10-CM

## 2016-08-25 DIAGNOSIS — Z794 Long term (current) use of insulin: Secondary | ICD-10-CM | POA: Diagnosis not present

## 2016-08-25 DIAGNOSIS — E1121 Type 2 diabetes mellitus with diabetic nephropathy: Secondary | ICD-10-CM | POA: Diagnosis not present

## 2016-08-25 DIAGNOSIS — IMO0002 Reserved for concepts with insufficient information to code with codable children: Secondary | ICD-10-CM

## 2016-08-25 DIAGNOSIS — J019 Acute sinusitis, unspecified: Secondary | ICD-10-CM

## 2016-08-25 MED ORDER — METFORMIN HCL 1000 MG PO TABS: 1000.0000 mg | ORAL_TABLET | Freq: Two times a day (BID) | ORAL | 3 refills | Status: DC

## 2016-08-25 MED ORDER — IPRATROPIUM BROMIDE 0.06 % NA SOLN: 2.0000 | Freq: Four times a day (QID) | NASAL | 0 refills | Status: DC

## 2016-08-25 NOTE — Patient Instructions (Signed)
Thank you for coming to the clinic today.  1. 1. It sounds like you have persistent Sinus Congestion or "Rhinosinusitis" - I do not think that this is a Bacterial Sinus Infection. Usually these are caused by Viruses or Allergies, and will run it's course in about 7 to 10 days. - No antibiotics are needed - Start Atrovent nasal spray decongestant 2 sprays in each nostril up to 4 times daily for 7 days - Continue allergy pill once daily - Continue Flonase 2 sprays in each nostril daily for next 4-6 weeks, then you may stop and use seasonally or as needed - Recommend to try using Nasal Saline spray multiple times a day to help flush out congestion and clear sinuses - Try Mucinex-DM if worsening chest congestion - Improve hydration by drinking plenty of clear fluids (water, gatorade) to reduce secretions and thin congestion - Congestion draining down throat can cause irritation. May try warm herbal tea with honey, cough drops - Can take Tylenol or Ibuprofen as needed for fevers  If you develop persistent fever >101F for at least 3 consecutive days, headaches with sinus pain or pressure or persistent earache, thicker congestion please schedule a follow-up evaluation within next few days to week - OR notify office and let me know then can send in an antibiotic if needed.  Discontinued Metformin 24 hour pill - this was the incorrect pill, sorry for inconvenience, I am not sure if this is due to pharmacy error or prescribing error.  I sent new rx Metformin 1000mg  tablets take 1 pill TWICE daily with food - [facility], - check for cost and coverage on this one, it should be very inexpensive  For Tradjenta, if the cost is too much or not covered by insurance, this may not be the preferred medicine. Ask them about other options of this same class, possibly Sitagliptin (Januvia). Let me know and I can send this new rx to pharmacy if you find a preferred option.  Please  schedule a Follow-up Appointment to: No Follow-up on file.  If you have any other questions or concerns, please feel free to call the clinic or send a message through MyChart. You may also schedule an earlier appointment if necessary.  Saralyn PilarAlexander Hollynn Garno, DO [facility]

## 2016-08-25 NOTE — Progress Notes (Signed)
Subjective:    Patient ID: [name], male    DOB: 11/04/1950, 66 y.o.   MRN: [id number]  [name] is a 66 y.o. male presenting on 08/25/2016 for Sinus Problem (medication question FOR  metformin insurance charging 1000 $ )   HPI   ALLERGIC RHINOSINUSITIS / HEADACHE Reports recent problem about past 1 month with sinus pressure, pain, congestion with drainage, gradual worsening over past 4 weeks and some intermittent improvement. Describes thicker sinus congestion worse with outdoor exposure, has history of allergies - Taking OTC Anti-histamine once daily, unsure of name. Also has Flonase using 2 sprays on each side just started using this for past < 1 week - Last antibiotic for lower leg cellulitis, treated with Keflex and Doxycycline on 07/25/16, saw wound care physician, he cancelled the follow-up apt - Admits headache, some dizziness with head movements and position change - Denies fevers/chills, sweats, nasal purulence, dyspnea, productive cough, chest pain or pressure  Diabetes: - Questions today about cost of Metformin, states the brand name sent for 1000mg  24 hr pills was very expensive. States the Tradjenta was ordered as well. He is unsure of preferred option or if another is cheaper. - Recently form sent in for changing to [facility]  Social History  Substance Use Topics  . Smoking status: Passive Smoke Exposure - Never Smoker    Packs/day: 1.00    Years: 39.00  . Smokeless tobacco: Current User    Types: Chew     Comment: wife states patient never smoked cigarettes, just chewing tobacco  . Alcohol use No    Review of Systems Per HPI unless specifically indicated above     Objective:    BP 127/66   Pulse 72   Temp 98.6 F (37 C) (Oral)   Resp 16   Ht 5\' 11"  (1.803 m)   Wt 235 lb (106.6 kg)   BMI 32.78 kg/m   Wt Readings from Last 3 Encounters:  08/25/16 235 lb (106.6 kg)  08/19/16 237 lb (107.5 kg)  07/25/16 245  lb (111.1 kg)    Physical Exam  Constitutional: He appears well-developed and well-nourished. No distress.  Well-appearing, comfortable, cooperative  HENT:  Head: Normocephalic and atraumatic.  Mouth/Throat: Oropharynx is clear and moist.  Frontal / maxillary sinuses mild tender R>L. Nares with some turbinate edema and congestion without purulence. Bilateral TMs clear with mild effusion R>L without erythema or bulging. Oropharynx clear without erythema, exudates, edema or asymmetry.  Eyes: Conjunctivae are normal.  Cardiovascular: Normal rate, regular rhythm, normal heart sounds and intact distal pulses.   No murmur heard. Pulmonary/Chest: Effort normal and breath sounds normal. No respiratory distress. He has no wheezes. He has no rales.  Lymphadenopathy:    He has no cervical adenopathy.  Neurological: He is alert.  Skin: Skin is warm and dry. No rash noted. He is not diaphoretic. No erythema.  Psychiatric: He has a normal mood and affect. His behavior is normal.  Nursing note and vitals reviewed.  Results for orders placed or performed in visit on 08/19/16  Basic metabolic panel  Result Value Ref Range   Glucose 232 (H) 65 - 99 mg/dL   BUN 15 8 - 27 mg/dL   Creatinine, Ser 1.91 (H) 0.76 - 1.27 mg/dL   GFR calc non Af Amer 59 (L) >59 mL/min/1.73   GFR calc Af Amer 68 >59 mL/min/1.73   BUN/Creatinine Ratio 12 10 - 24   Sodium 136 134 -  144 mmol/L   Potassium 5.2 3.5 - 5.2 mmol/L   Chloride 97 96 - 106 mmol/L   CO2 23 18 - 29 mmol/L   Calcium 10.4 (H) 8.6 - 10.2 mg/dL  Lipid Panel w/o Chol/HDL Ratio  Result Value Ref Range   Cholesterol, Total 205 (H) 100 - 199 mg/dL   Triglycerides 161149 0 - 149 mg/dL   HDL 42 >09>39 mg/dL   VLDL Cholesterol Cal 30 5 - 40 mg/dL   LDL Calculated 604133 (H) 0 - 99 mg/dL  Hgb V4UA1c w/o eAG  Result Value Ref Range   Hgb A1c MFr Bld 10.9 (H) 4.8 - 5.6 %      Assessment & Plan:   Problem List Items Addressed This Visit    None    Visit Diagnoses      Subacute rhinosinusitis    -  Primary  Consistent with subacute frontal rhinosinusitis, likely initially viral URI vs allergic rhinitis component with prolonged duration >1 month now, interval intermittent improvement. No evidence of bacterial infection. Recent course antibiotics 4 weeks ago for cellulitis (keflex, doxy)  Plan: 1. Reassurance, likely self-limited - no indication for antibiotics at this time 2. Continue OTC Anti-histamine and recently resumed Flonase 2 sprays in each nostril daily for next 4-6 weeks, then may stop and use seasonally or as needed - advised will take time for this to have full effect 3. Start Atrovent nasal spray decongestant 2 sprays in each nostril up to 4 times daily for 7 days 4. Supportive care with nasal saline OTC, hydration 5. Return criteria reviewed - consider augmentin or z-pak if worsening concerns bacterial sinusitis within 1-2 weeks    Relevant Medications   ipratropium (ATROVENT) 0.06 % nasal spray   Uncontrolled type 2 diabetes mellitus with diabetic nephropathy, with long-term current use of insulin (HCC)      Reviewed med list, and it appears incorrect metformin brand 1000mg  24 hr ER tab was sent to pharmacy, have discontinued this and switched to Metformin generic 1000mg  BID dosing #180 for 90 day supply [facility] - No change to tradjenta, this was already ordered. Asked if they need cheaper option, to check with insurance on preferred medication, can change, possibly Sitagliptin. - He has not checked with ins preference on GLP1 agents, agree to hold off on this for now and re-check A1c 3 months, as discussed in result note, his CBG readings today are improved    Relevant Medications   metFORMIN (GLUCOPHAGE) 1000 MG tablet      Meds ordered this encounter  Medications  . ipratropium (ATROVENT) 0.06 % nasal spray    Sig: Place 2 sprays into both nostrils 4 (four) times daily. For up to 5-7 days then stop.     Dispense:  15 mL    Refill:  0  . metFORMIN (GLUCOPHAGE) 1000 MG tablet    Sig: Take 1 tablet (1,000 mg total) by mouth 2 (two) times daily with a meal.    Dispense:  180 tablet    Refill:  3     Follow up plan: Return in about 2 weeks (around 09/08/2016), or if symptoms worsen or fail to improve, for Sinusitis / Headache.  Saralyn PilarAlexander Karamalegos, DO [facility] 08/25/2016, 5:05 PM

## 2016-09-02 ENCOUNTER — Ambulatory Visit: Payer: Managed Care, Other (non HMO) | Admitting: Family Medicine

## 2016-09-30 ENCOUNTER — Ambulatory Visit: Payer: Managed Care, Other (non HMO) | Admitting: Family Medicine

## 2016-10-07 ENCOUNTER — Ambulatory Visit (INDEPENDENT_AMBULATORY_CARE_PROVIDER_SITE_OTHER): Payer: Medicare Other | Admitting: Family Medicine

## 2016-10-07 ENCOUNTER — Encounter: Payer: Self-pay | Admitting: Family Medicine

## 2016-10-07 VITALS — BP 118/58 | HR 72 | Temp 98.6°F | Resp 16 | Ht 71.0 in | Wt 240.0 lb

## 2016-10-07 DIAGNOSIS — M25562 Pain in left knee: Secondary | ICD-10-CM

## 2016-10-07 DIAGNOSIS — Z794 Long term (current) use of insulin: Secondary | ICD-10-CM | POA: Diagnosis not present

## 2016-10-07 DIAGNOSIS — N183 Chronic kidney disease, stage 3 unspecified: Secondary | ICD-10-CM

## 2016-10-07 DIAGNOSIS — E1165 Type 2 diabetes mellitus with hyperglycemia: Secondary | ICD-10-CM

## 2016-10-07 DIAGNOSIS — E1121 Type 2 diabetes mellitus with diabetic nephropathy: Secondary | ICD-10-CM | POA: Diagnosis not present

## 2016-10-07 DIAGNOSIS — E1122 Type 2 diabetes mellitus with diabetic chronic kidney disease: Secondary | ICD-10-CM | POA: Insufficient documentation

## 2016-10-07 DIAGNOSIS — G8929 Other chronic pain: Secondary | ICD-10-CM | POA: Insufficient documentation

## 2016-10-07 DIAGNOSIS — IMO0002 Reserved for concepts with insufficient information to code with codable children: Secondary | ICD-10-CM

## 2016-10-07 MED ORDER — DICLOFENAC SODIUM 1 % TD GEL: 2.0000 g | Freq: Three times a day (TID) | TRANSDERMAL | 3 refills | Status: DC

## 2016-10-07 NOTE — Assessment & Plan Note (Signed)
Subacute on chronic L generalized Knee pain and swelling without known injury or trauma, with old chronic injury, and known knee OA/DJD - No knee instability or mechanical locking - S/p old cartilage injury/surgery >50 yr ago - Inadequate conservative therapy now, failed Ibuprofen, Naproxen, Tylenol. Contraindicated NSAIDs due to CKD.  Plan: 1. New rx today for topical NSAID diclofenac 1g TID PRN for knees 2. Start Tylenol 500-1000mg  per dose TID PRN breakthrough 3. RICE therapy (rest, ice, compression, elevation) for swelling, activity modification 4. Future consider Knee x-rays - re-evaluate 5. Follow-up 8 weeks consider steroid injection and referral to Ortho for further eval

## 2016-10-07 NOTE — Assessment & Plan Note (Signed)
Suspected improved DM control, with reported CBG, seems to still be variable with readings. Not due for A1c yet, trend was increasing up to 10.6 - Complication CKD-III - Failed Invokana, and concern Metformin given CKD but now improved  Plan: 1. Continue increase Lantus as advised 15u nightly but may titrate up if needed up to 20 if fasting AM cbg >150 up by 1 every few days 2. Keep checking CBG 1-3x daily write down log, hand out given, or else I will have very difficult time titrating his lantus 3. Continue Metformin 1000mg  BID and Tradjenta 5mg  4. Discussed that if A1c truly is >10, will need to add other agent, likely injectable GLP1, prefer once weekly either Trulicity or Bydureon BCise, may check ins preference OR can try Lantus+GLP1 Soliqua - he should notify me by next apt if coverage for any of these 5. Recommend ASA 81mg  daily and consider alternatives to statin or low dose infrequent vs PSK9 if need 6. Follow-up 8 weeks for DM A1c, med adjust

## 2016-10-07 NOTE — Patient Instructions (Addendum)
Thank you for coming to the clinic today.  1.  Recommend to start taking Tylenol Extra Strength 500mg  tabs - take 1 to 2 tabs per dose (max 1000mg ) every 6-8 hours for pain (take regularly, don't skip a dose for next 7 days), max 24 hour daily dose is 6 tablets or 3000mg . In the future you can repeat the same everyday Tylenol course for 1-2 weeks at a time.   Be very cautious with oral Ibuprofen, due to kidneys.  Use RICE therapy: - R - Rest / relative rest with activity modification avoid overuse of joint - I - Ice packs (make sure you use a towel or sock / something to protect skin) - C - Compression with flexible Knee Sleeve / ACE wrap to apply pressure and reduce swelling allowing more support - E - Elevation - if significant swelling, lift leg above heart level (toes above your nose) to help reduce swelling, most helpful at night after day of being on your feet  ---------------------------- Call insurance to find cost and coverage of the following  1. Bydureon BCise (Exenatide ER) - once weekly - this is my preference, very good medicine well tolerated, less side effects of nausea, upset stomach. No dose changes. Cost and coverage is the problem, but we may be able to get it with the coupon card  2. Trulicity (Dulaglutide) - once weekly - this is very good one, usually one of my top choices as well, two doses, 0.75 (likely we would start) and 1.5 max dose. We can use coupon card here too  3. Victoza (Liraglutide) - once DAILY - 3 dose changes 0.6, 1.2 and 1.8, side effects nausea, upset stomach higher on this one but it is still very effective medicine  4. Lawana ChambersSoliqua (this is combination of your lantus insulin + a different medicine) - so only one injection daily.  Please schedule a Follow-up Appointment to: Return in about 8 weeks (around 12/02/2016) for Diabetes A1c, L Knee Pain.  If you have any other questions or concerns, please feel free to call the clinic or send a message through  MyChart. You may also schedule an earlier appointment if necessary.  Additionally, you may be receiving a survey about your experience at our clinic within a few days to 1 week by e-mail or mail. We value your feedback.  Saralyn PilarAlexander Ariyel Jeangilles, DO [facility]

## 2016-10-07 NOTE — Assessment & Plan Note (Signed)
Stable CKD-III, previously improved from AKI in 2016. Secondary to age, Diabetes, possibly NSAID  Plan: 1. Improve hydration, limit NSAIDs (now try topical NSAID instead) 2. Monitor Cr trend 3. Control DM 4. Follow-up q 6-12 months Cr

## 2016-10-07 NOTE — Progress Notes (Signed)
Subjective:    Patient ID: [name], male    DOB: January 02, 1951, 66 y.o.   MRN: [id number]  [name] is a 66 y.o. male presenting on 10/07/2016 for Diabetes (highest 237 and lowest 137 and ranges 117 follow up med adjust.)   HPI   CHRONIC DM, Type 2 with CKD-III Reports he forgot CBG log today. He can recall some. He admits recent trip he was not adhering to diet as much. CBGs: Avg 110-120, AM fasting < 150, PM 150-200s, low none, high < 200. Checks CBG 1-3x daily Meds: Lantus 15u PM, Metformin 1000mg  BID, Tradjenta 5mg  daily - (remains off Glimepiride and Invokana) Reports good compliance. Tolerating well w/o side-effects Currently on ACEi Lifestyle: - Diet (tried to reduce AM portion size, still eating some sandwiches, and carbs, trying to eat more regular)  - Exercise (Limited regular exercise still) Denies hypoglycemia, polyuria, visual changes, numbness or tingling.  Left Knee Pain, Chronic Arthritis - Reports prior background information of arthritis in L knee from old injury, about 50 years ago when teenager had twisting injury of knee and damaged cartilage, had surgical treatment at that time. Has had chronic problems of some aches and swelling in knee. - Now reporting symptoms over 1-2 years without inciting injury. Gradually improving. Describes pain as aching, moderate severity with intermittent worsening if pressure on knee (crawling, kneeling) or overuse. No pain radiating to legs. - Tried other therapy in past with Ibuprofen rx and Naproxen rx, Tylenol. - Denies any fevers/chills, numbness, tingling, weakness, fall injury, mechanical locking of knee  Social History  Substance Use Topics  . Smoking status: Passive Smoke Exposure - Never Smoker    Packs/day: 1.00    Years: 39.00  . Smokeless tobacco: Current User    Types: Chew     Comment: wife states patient never smoked cigarettes, just chewing tobacco  . Alcohol use No    Review of Systems Per  HPI unless specifically indicated above     Objective:    BP (!) 118/58   Pulse 72   Temp 98.6 F (37 C) (Oral)   Resp 16   Ht 5\' 11"  (1.803 m)   Wt 240 lb (108.9 kg)   BMI 33.47 kg/m   Wt Readings from Last 3 Encounters:  10/07/16 240 lb (108.9 kg)  08/25/16 235 lb (106.6 kg)  08/19/16 237 lb (107.5 kg)    Physical Exam  Constitutional: He is oriented to person, place, and time. He appears well-developed and well-nourished. No distress.  Well-appearing, comfortable, cooperative  HENT:  Head: Normocephalic and atraumatic.  Eyes: Conjunctivae are normal.  Cardiovascular: Normal rate, regular rhythm, normal heart sounds and intact distal pulses.   No murmur heard. Pulmonary/Chest: Effort normal and breath sounds normal. No respiratory distress. He has no wheezes. He has no rales.  Musculoskeletal: Normal range of motion. He exhibits no edema.  Bilateral Knees Inspection: Left knee with mild to moderate bulky appearance compared to R, old well healed incision scar lateral. No ecchymosis or effusion. Palpation: Non-tender. medial/lateral joint line. Moderate fine crepitus ROM: Full active ROM bilaterally Special Testing: Lachman / Valgus/Varus tests negative with intact ligaments (ACL, MCL, LCL). Standing Thessaly meniscus testing negative for pain or instability. Strength: 5/5 intact knee flex/ext, ankle dorsi/plantarflex Neurovascular: distally intact sensation light touch and pulses  Neurological: He is alert and oriented to person, place, and time.  Skin: Skin is warm and dry. No rash noted. He is not diaphoretic. No erythema.  Psychiatric: He has a normal mood and affect. His behavior is normal.  Well groomed, good eye contact, normal speech and thoughts  Nursing note and vitals reviewed.    Results for orders placed or performed in visit on 08/19/16  Basic metabolic panel  Result Value Ref Range   Glucose 232 (H) 65 - 99 mg/dL   BUN 15 8 - 27 mg/dL   Creatinine, Ser  1.61 (H) 0.76 - 1.27 mg/dL   GFR calc non Af Amer 59 (L) >59 mL/min/1.73   GFR calc Af Amer 68 >59 mL/min/1.73   BUN/Creatinine Ratio 12 10 - 24   Sodium 136 134 - 144 mmol/L   Potassium 5.2 3.5 - 5.2 mmol/L   Chloride 97 96 - 106 mmol/L   CO2 23 18 - 29 mmol/L   Calcium 10.4 (H) 8.6 - 10.2 mg/dL  Lipid Panel w/o Chol/HDL Ratio  Result Value Ref Range   Cholesterol, Total 205 (H) 100 - 199 mg/dL   Triglycerides 096 0 - 149 mg/dL   HDL 42 >04 mg/dL   VLDL Cholesterol Cal 30 5 - 40 mg/dL   LDL Calculated 540 (H) 0 - 99 mg/dL  Hgb J8J w/o eAG  Result Value Ref Range   Hgb A1c MFr Bld 10.9 (H) 4.8 - 5.6 %      Assessment & Plan:   Problem List Items Addressed This Visit    Diabetes type 2, uncontrolled (HCC) - Primary    Suspected improved DM control, with reported CBG, seems to still be variable with readings. Not due for A1c yet, trend was increasing up to 10.6 - Complication CKD-III - Failed Invokana, and concern Metformin given CKD but now improved  Plan: 1. Continue increase Lantus as advised 15u nightly but may titrate up if needed up to 20 if fasting AM cbg >150 up by 1 every few days 2. Keep checking CBG 1-3x daily write down log, hand out given, or else I will have very difficult time titrating his lantus 3. Continue Metformin 1000mg  BID and Tradjenta 5mg  4. Discussed that if A1c truly is >10, will need to add other agent, likely injectable GLP1, prefer once weekly either Trulicity or Bydureon BCise, may check ins preference OR can try Lantus+GLP1 Soliqua - he should notify me by next apt if coverage for any of these 5. Recommend ASA 81mg  daily and consider alternatives to statin or low dose infrequent vs PSK9 if need 6. Follow-up 8 weeks for DM A1c, med adjust      CKD (chronic kidney disease), stage III    Stable CKD-III, previously improved from AKI in 2016. Secondary to age, Diabetes, possibly NSAID  Plan: 1. Improve hydration, limit NSAIDs (now try topical NSAID  instead) 2. Monitor Cr trend 3. Control DM 4. Follow-up q 6-12 months Cr      Chronic pain of left knee    Subacute on chronic L generalized Knee pain and swelling without known injury or trauma, with old chronic injury, and known knee OA/DJD - No knee instability or mechanical locking - S/p old cartilage injury/surgery >50 yr ago - Inadequate conservative therapy now, failed Ibuprofen, Naproxen, Tylenol. Contraindicated NSAIDs due to CKD.  Plan: 1. New rx today for topical NSAID diclofenac 1g TID PRN for knees 2. Start Tylenol 500-1000mg  per dose TID PRN breakthrough 3. RICE therapy (rest, ice, compression, elevation) for swelling, activity modification 4. Future consider Knee x-rays - re-evaluate 5. Follow-up 8 weeks consider steroid injection and referral to Ortho for  further eval      Relevant Medications   diclofenac sodium (VOLTAREN) 1 % GEL      Meds ordered this encounter  Medications  . diclofenac sodium (VOLTAREN) 1 % GEL    Sig: Apply 2 g topically 3 (three) times daily.    Dispense:  100 g    Refill:  3    Follow up plan: Return in about 8 weeks (around 12/02/2016) for Diabetes A1c, L Knee Pain.  Saralyn PilarAlexander Jeannene Tschetter, DO [facility] 10/07/2016, 12:26 PM

## 2016-10-09 DIAGNOSIS — S76011A Strain of muscle, fascia and tendon of right hip, initial encounter: Secondary | ICD-10-CM | POA: Diagnosis not present

## 2016-10-15 DIAGNOSIS — M5416 Radiculopathy, lumbar region: Secondary | ICD-10-CM | POA: Diagnosis not present

## 2016-10-15 DIAGNOSIS — M545 Low back pain: Secondary | ICD-10-CM | POA: Diagnosis not present

## 2016-10-26 DIAGNOSIS — M5416 Radiculopathy, lumbar region: Secondary | ICD-10-CM | POA: Diagnosis not present

## 2016-10-26 DIAGNOSIS — M5136 Other intervertebral disc degeneration, lumbar region: Secondary | ICD-10-CM | POA: Diagnosis not present

## 2016-11-03 ENCOUNTER — Telehealth: Payer: Self-pay | Admitting: Family Medicine

## 2016-11-03 ENCOUNTER — Other Ambulatory Visit: Payer: Self-pay

## 2016-11-03 NOTE — Telephone Encounter (Signed)
changed pharmacy to [facility] [place] as per pt request pt is aware of change.

## 2016-11-03 NOTE — Telephone Encounter (Signed)
[place] [facility] [place]  He still had a refill at another pharmacy but had to change to [facility] because of insurance.  His call back number is [phone]

## 2016-11-04 ENCOUNTER — Other Ambulatory Visit: Payer: Self-pay | Admitting: Orthopaedic Surgery

## 2016-11-04 DIAGNOSIS — M5136 Other intervertebral disc degeneration, lumbar region: Secondary | ICD-10-CM

## 2016-11-04 DIAGNOSIS — M5416 Radiculopathy, lumbar region: Secondary | ICD-10-CM

## 2016-11-11 ENCOUNTER — Ambulatory Visit
Admission: RE | Admit: 2016-11-11 | Discharge: 2016-11-11 | Disposition: A | Discharge status: Home / Self Care | Payer: Medicare Other | Source: Ambulatory Visit | Attending: Orthopaedic Surgery | Admitting: Orthopaedic Surgery

## 2016-11-11 DIAGNOSIS — M5416 Radiculopathy, lumbar region: Secondary | ICD-10-CM

## 2016-11-11 DIAGNOSIS — M5136 Other intervertebral disc degeneration, lumbar region: Secondary | ICD-10-CM

## 2016-11-17 DIAGNOSIS — M47816 Spondylosis without myelopathy or radiculopathy, lumbar region: Secondary | ICD-10-CM | POA: Diagnosis not present

## 2016-11-17 DIAGNOSIS — M47817 Spondylosis without myelopathy or radiculopathy, lumbosacral region: Secondary | ICD-10-CM | POA: Diagnosis not present

## 2016-11-17 DIAGNOSIS — M5127 Other intervertebral disc displacement, lumbosacral region: Secondary | ICD-10-CM | POA: Diagnosis not present

## 2016-11-17 DIAGNOSIS — M5126 Other intervertebral disc displacement, lumbar region: Secondary | ICD-10-CM | POA: Diagnosis not present

## 2016-11-22 DIAGNOSIS — E119 Type 2 diabetes mellitus without complications: Secondary | ICD-10-CM | POA: Diagnosis not present

## 2016-11-22 DIAGNOSIS — M5416 Radiculopathy, lumbar region: Secondary | ICD-10-CM | POA: Diagnosis not present

## 2016-11-22 DIAGNOSIS — M5136 Other intervertebral disc degeneration, lumbar region: Secondary | ICD-10-CM | POA: Diagnosis not present

## 2016-11-23 ENCOUNTER — Telehealth: Payer: Self-pay | Admitting: Family Medicine

## 2016-11-23 NOTE — Telephone Encounter (Signed)
Discussed case and question with Elvina Mattes, CMA. Agree with her documentation below after her conversation with patient updating them on plan. Patient should have already been on Lantus 15u nightly since our last visit from July 2018, he seems to have not adhered to medication change with our plan. Also we were anticipating starting new GLP1 medication but have not heard back on cost/coverge options for this. Will discuss further at next visit, have concerns with his uncontrolled diabetes and medication non adherence.  Saralyn Pilar, DO [facility] 11/23/2016, 12:12 PM

## 2016-11-23 NOTE — Telephone Encounter (Signed)
Patient called concerned about elevated blood sugar after steroid injection yesterday it was 415 with initial check which was increased to 450 at night so pt has taken 15 unit of insulin and this morning was 287 which was increased to 329 and as per Dr. Althea Charon  if his sugar level is grater than 150 in morning he can increase up to 20 unit by gradually increase 1 unit every 3 rd day.

## 2016-12-05 ENCOUNTER — Telehealth: Payer: Self-pay | Admitting: Family Medicine

## 2016-12-05 ENCOUNTER — Inpatient Hospital Stay
Admission: EM | Admit: 2016-12-05 | Discharge: 2016-12-06 | DRG: 176 | Disposition: A | Discharge status: Home / Self Care | Payer: Medicare Other | Attending: Internal Medicine | Admitting: Internal Medicine

## 2016-12-05 ENCOUNTER — Emergency Department: Payer: Medicare Other

## 2016-12-05 DIAGNOSIS — I129 Hypertensive chronic kidney disease with stage 1 through stage 4 chronic kidney disease, or unspecified chronic kidney disease: Secondary | ICD-10-CM | POA: Diagnosis present

## 2016-12-05 DIAGNOSIS — I2699 Other pulmonary embolism without acute cor pulmonale: Secondary | ICD-10-CM | POA: Diagnosis not present

## 2016-12-05 DIAGNOSIS — N183 Chronic kidney disease, stage 3 unspecified: Secondary | ICD-10-CM | POA: Diagnosis present

## 2016-12-05 DIAGNOSIS — Z794 Long term (current) use of insulin: Secondary | ICD-10-CM | POA: Diagnosis not present

## 2016-12-05 DIAGNOSIS — E1122 Type 2 diabetes mellitus with diabetic chronic kidney disease: Secondary | ICD-10-CM | POA: Diagnosis present

## 2016-12-05 DIAGNOSIS — E119 Type 2 diabetes mellitus without complications: Secondary | ICD-10-CM | POA: Diagnosis not present

## 2016-12-05 DIAGNOSIS — I10 Essential (primary) hypertension: Secondary | ICD-10-CM | POA: Diagnosis present

## 2016-12-05 DIAGNOSIS — R0602 Shortness of breath: Secondary | ICD-10-CM | POA: Diagnosis not present

## 2016-12-05 DIAGNOSIS — Z86711 Personal history of pulmonary embolism: Secondary | ICD-10-CM | POA: Diagnosis present

## 2016-12-05 DIAGNOSIS — F17290 Nicotine dependence, other tobacco product, uncomplicated: Secondary | ICD-10-CM | POA: Diagnosis present

## 2016-12-05 DIAGNOSIS — Z7722 Contact with and (suspected) exposure to environmental tobacco smoke (acute) (chronic): Secondary | ICD-10-CM | POA: Diagnosis present

## 2016-12-05 DIAGNOSIS — E785 Hyperlipidemia, unspecified: Secondary | ICD-10-CM | POA: Diagnosis present

## 2016-12-05 DIAGNOSIS — Z8249 Family history of ischemic heart disease and other diseases of the circulatory system: Secondary | ICD-10-CM | POA: Diagnosis not present

## 2016-12-05 DIAGNOSIS — Z888 Allergy status to other drugs, medicaments and biological substances status: Secondary | ICD-10-CM | POA: Diagnosis not present

## 2016-12-05 DIAGNOSIS — K219 Gastro-esophageal reflux disease without esophagitis: Secondary | ICD-10-CM | POA: Diagnosis present

## 2016-12-05 DIAGNOSIS — M4800 Spinal stenosis, site unspecified: Secondary | ICD-10-CM | POA: Diagnosis present

## 2016-12-05 DIAGNOSIS — R609 Edema, unspecified: Secondary | ICD-10-CM

## 2016-12-05 DIAGNOSIS — E1169 Type 2 diabetes mellitus with other specified complication: Secondary | ICD-10-CM | POA: Diagnosis present

## 2016-12-05 DIAGNOSIS — E1121 Type 2 diabetes mellitus with diabetic nephropathy: Secondary | ICD-10-CM | POA: Diagnosis present

## 2016-12-05 DIAGNOSIS — R51 Headache: Secondary | ICD-10-CM | POA: Diagnosis present

## 2016-12-05 DIAGNOSIS — E1165 Type 2 diabetes mellitus with hyperglycemia: Secondary | ICD-10-CM | POA: Diagnosis present

## 2016-12-05 LAB — FIBRIN DERIVATIVES D-DIMER (ARMC ONLY): Fibrin derivatives D-dimer (ARMC): 3289.95 — ABNORMAL HIGH (ref 0.00–499.00)

## 2016-12-05 LAB — BASIC METABOLIC PANEL
Anion gap: 10 (ref 5–15)
BUN: 31 mg/dL — ABNORMAL HIGH (ref 6–20)
CO2: 22 mmol/L (ref 22–32)
Calcium: 9.7 mg/dL (ref 8.9–10.3)
Chloride: 100 mmol/L — ABNORMAL LOW (ref 101–111)
Creatinine, Ser: 1.6 mg/dL — ABNORMAL HIGH (ref 0.61–1.24)
GFR calc Af Amer: 51 mL/min — ABNORMAL LOW (ref >60)
GFR calc non Af Amer: 44 mL/min — ABNORMAL LOW (ref >60)
Glucose, Bld: 226 mg/dL — ABNORMAL HIGH (ref 65–99)
Potassium: 5.1 mmol/L (ref 3.5–5.1)
Sodium: 132 mmol/L — ABNORMAL LOW (ref 135–145)

## 2016-12-05 LAB — CBC
HCT: 40.6 % (ref 40.0–52.0)
Hemoglobin: 13.9 g/dL (ref 13.0–18.0)
MCH: 30.7 pg (ref 26.0–34.0)
MCHC: 34.2 g/dL (ref 32.0–36.0)
MCV: 89.9 fL (ref 80.0–100.0)
Platelets: 320 10*3/uL (ref 150–440)
RBC: 4.51 MIL/uL (ref 4.40–5.90)
RDW: 14.1 % (ref 11.5–14.5)
WBC: 11.8 10*3/uL — ABNORMAL HIGH (ref 3.8–10.6)

## 2016-12-05 LAB — TROPONIN I
Troponin I: <0.03 ng/mL (ref <0.03)
Troponin I: <0.03 ng/mL (ref <0.03)

## 2016-12-05 MED ORDER — IOPAMIDOL (ISOVUE-370) INJECTION 76%
60.0000 mL | Freq: Once | INTRAVENOUS | Status: AC | PRN
  Administered 2016-12-05: 60 mL via INTRAVENOUS

## 2016-12-05 MED ORDER — ENOXAPARIN SODIUM 100 MG/ML ~~LOC~~ SOLN
100.0000 mg | Freq: Once | SUBCUTANEOUS | Status: AC
  Administered 2016-12-06: 100 mg via SUBCUTANEOUS
  Filled 2016-12-05: qty 1

## 2016-12-05 NOTE — ED Triage Notes (Signed)
Pt arrives to ER via POV c/o SOB on exertion and chest tightness on exertion. Pt states chest tightness resolves when he rests. Pt talks in full and complete sentences at this time. Pt alert and oriented X4, active, cooperative, pt in NAD. RR even and unlabored, color WNL.

## 2016-12-05 NOTE — Telephone Encounter (Signed)
Pt is concerned about having chest pain onset 2 weeks after getting steroid injection from orthopedic and now having SOB from past 2 days when walking around but gets better when he is sitting down and has light HA today as per Dr. Kirtland BouchardK advised pt to go to the hospital where he can be evaluated with further testing and can also called orthopedic to get their opinion.

## 2016-12-05 NOTE — ED Notes (Signed)
Pt resting in bed. NAD noted at this time, pt's wife remains at bedside with him. Pt requesting a urinal to go to the bathroom. Pt given a urinal. Will continue to monitor for further patient needs.

## 2016-12-05 NOTE — H&P (Signed)
[facility] - [place] at [facility]   PATIENT NAME: [name]    MR#:  [id number]  DATE OF BIRTH:  December 01, 1950  DATE OF ADMISSION:  12/05/2016  PRIMARY CARE PHYSICIAN: [name], DO   REQUESTING/REFERRING PHYSICIAN: Marisa Severin, MD  CHIEF COMPLAINT:   Chief Complaint  Patient presents with  . Shortness of Breath  . Chest Pain    tightness    HISTORY OF PRESENT ILLNESS:  [name]  is a 66 y.o. male who presents with shortness of breath and some chest tightness. Patient states that these symptoms started a few weeks ago and have been getting progressively worse. He has been significantly sedentary of late due to right knee pain as well as spinal stenosis. His symptoms started mostly as dyspnea on exertion, but they have gotten worse and he has been more limited in the amount that he can walk before becoming significantly winded. Here in the ED tonight he was found to have PE on CT scan with some right heart strain. ospitalists were called for admission  PAST MEDICAL HISTORY:   Past Medical History:  Diagnosis Date  . Allergy   . Chronic headaches   . Diabetes mellitus without complication (HCC)   . GERD (gastroesophageal reflux disease)   . Heart burn   . Hyperlipidemia   . Hypertension     PAST SURGICAL HISTORY:   Past Surgical History:  Procedure Laterality Date  . KNEE SURGERY    . SHOULDER SURGERY    . TONSILLECTOMY      SOCIAL HISTORY:   Social History  Substance Use Topics  . Smoking status: Passive Smoke Exposure - Never Smoker    Packs/day: 1.00    Years: 39.00  . Smokeless tobacco: Current User    Types: Chew     Comment: wife states patient never smoked cigarettes, just chewing tobacco  . Alcohol use No    FAMILY HISTORY:   Family History  Problem Relation Age of Onset  . Cancer Father        lung  . Heart disease Sister   . Heart disease Brother     DRUG ALLERGIES:   Allergies  Allergen Reactions   . Invokana [Canagliflozin] Other (See Comments)    dehydrated  . Prednisone Other (See Comments)    MEDICATIONS AT HOME:   Prior to Admission medications   Medication Sig Start Date End Date Taking? Authorizing Provider  allopurinol (ZYLOPRIM) 100 MG tablet Take 1 tablet (100 mg total) by mouth daily. Patient taking differently: Take 100 mg by mouth at bedtime.  08/18/16  Yes Karamalegos, Netta Neat, DO  amLODipine (NORVASC) 2.5 MG tablet Take 1 tablet (2.5 mg total) by mouth daily. Patient taking differently: Take 2.5 mg by mouth at bedtime.  08/18/16  Yes Karamalegos, Netta Neat, DO  colchicine 0.6 MG tablet Take 1 tablet (0.6 mg total) by mouth as needed. Patient taking differently: Take 0.6 mg by mouth as needed (gout flare).  08/18/16  Yes Karamalegos, Netta Neat, DO  fluticasone (FLONASE) 50 MCG/ACT nasal spray Place 2 sprays into both nostrils daily. 08/18/16  Yes Karamalegos, Netta Neat, DO  Insulin Glargine (LANTUS SOLOSTAR) 100 UNIT/ML Solostar Pen Inject 10 Units into the skin daily at 10 pm. Patient taking differently: Inject 15 Units into the skin daily at 10 pm.  08/18/16  Yes Karamalegos, Netta Neat, DO  linagliptin (TRADJENTA) 5 MG TABS tablet Take 1 tablet (5 mg total) by mouth daily. 08/18/16  Yes  Karamalegos, Alexander J, DO  lisinopril (PRINIVIL,ZESTRIL) 40 MG tablet Take 1 tablet (40 mg total) by mouth daily. 08/18/16  Yes Karamalegos, Netta Neat, DO  omeprazole (PRILOSEC) 20 MG capsule Take 1 capsule (20 mg total) by mouth daily. 08/18/16  Yes Karamalegos, Netta Neat, DO  diclofenac sodium (VOLTAREN) 1 % GEL Apply 2 g topically 3 (three) times daily. Patient not taking: Reported on 12/05/2016 10/07/16   [name], DO  Insulin Pen Needle (PEN NEEDLES) 30G X 8 MM MISC Inject 1 application into the skin daily at 10 pm. 07/25/16   Galen Manila, NP  metFORMIN (GLUCOPHAGE) 1000 MG tablet Take 1 tablet (1,000 mg total) by mouth 2 (two) times daily with a  meal. Patient not taking: Reported on 12/05/2016 08/25/16   [name], DO    REVIEW OF SYSTEMS:  Review of Systems  Constitutional: Negative for chills, fever, malaise/fatigue and weight loss.  HENT: Negative for ear pain, hearing loss and tinnitus.   Eyes: Negative for blurred vision, double vision, pain and redness.  Respiratory: Positive for shortness of breath. Negative for cough and hemoptysis.   Cardiovascular: Negative for chest pain, palpitations, orthopnea and leg swelling.       Chest tightness  Gastrointestinal: Negative for abdominal pain, constipation, diarrhea, nausea and vomiting.  Genitourinary: Negative for dysuria, frequency and hematuria.  Musculoskeletal: Negative for back pain, joint pain and neck pain.  Skin:       No acne, rash, or lesions  Neurological: Negative for dizziness, tremors, focal weakness and weakness.  Endo/Heme/Allergies: Negative for polydipsia. Does not bruise/bleed easily.  Psychiatric/Behavioral: Negative for depression. The patient is not nervous/anxious and does not have insomnia.      VITAL SIGNS:   Vitals:   12/05/16 2215 12/05/16 2230 12/05/16 2300 12/05/16 2321  BP: 135/77 133/70 (!) 164/86 (!) 158/90  Pulse: 83 80 84 85  Resp: Temp:      TempSrc:      SpO2: 98% 96% 98% 99%  Weight:      Height:       Wt Readings from Last 3 Encounters:  12/05/16 103.9 kg (229 lb)  10/07/16 108.9 kg (240 lb)  08/25/16 106.6 kg (235 lb)    PHYSICAL EXAMINATION:  Physical Exam  Vitals reviewed. Constitutional: He is oriented to person, place, and time. He appears well-developed and well-nourished. No distress.  HENT:  Head: Normocephalic and atraumatic.  Mouth/Throat: Oropharynx is clear and moist.  Eyes: Pupils are equal, round, and reactive to light. Conjunctivae and EOM are normal. No scleral icterus.  Neck: Normal range of motion. Neck supple. No JVD present. No thyromegaly present.  Cardiovascular: Normal  rate, regular rhythm and intact distal pulses.  Exam reveals no gallop and no friction rub.   No murmur heard. Respiratory: Effort normal and breath sounds normal. No respiratory distress. He has no wheezes. He has no rales.  GI: Soft. Bowel sounds are normal. He exhibits no distension. There is no tenderness.  Musculoskeletal: Normal range of motion. He exhibits no edema.  No arthritis, no gout  Lymphadenopathy:    He has no cervical adenopathy.  Neurological: He is alert and oriented to person, place, and time. No cranial nerve deficit.  No dysarthria, no aphasia  Skin: Skin is warm and dry. No rash noted. No erythema.  Psychiatric: He has a normal mood and affect. His behavior is normal. Judgment and thought content normal.    LABORATORY PANEL:  CBC  Recent Labs Lab 12/05/16 1744  WBC 11.8*  HGB 13.9  HCT 40.6  PLT 320   ------------------------------------------------------------------------------------------------------------------  Chemistries   Recent Labs Lab 12/05/16 1744  NA 132*  K 5.1  CL 100*  CO2 22  GLUCOSE 226*  BUN 31*  CREATININE 1.60*  CALCIUM 9.7   ------------------------------------------------------------------------------------------------------------------  Cardiac Enzymes  Recent Labs Lab 12/05/16 2054  TROPONINI <0.03   ------------------------------------------------------------------------------------------------------------------  RADIOLOGY:  Dg Chest 2 View  Result Date: 12/05/2016 CLINICAL DATA:  Shortness of breath on exertion and chest tightness. EXAM: CHEST  2 VIEW COMPARISON:  10/21/2011 FINDINGS: The cardiac silhouette, mediastinal and hilar contours are normal and stable. The lungs are clear. No pleural effusion. The bony thorax is intact. IMPRESSION: No acute cardiopulmonary findings. Electronically Signed   By: Rudie MeyerP.  Gallerani M.D.   On: 12/05/2016 18:14   Ct Angio Chest Pe W And/or Wo Contrast  Result Date:  12/05/2016 CLINICAL DATA:  Chest pain onset 2 weeks ago after steroid injection. Dyspnea over the past 2 days while walking around. EXAM: CT ANGIOGRAPHY CHEST WITH CONTRAST TECHNIQUE: Multidetector CT imaging of the chest was performed using the standard protocol during bolus administration of intravenous contrast. Multiplanar CT image reconstructions and MIPs were obtained to evaluate the vascular anatomy. CONTRAST:  60 cc Isovue 370 IV COMPARISON:  12/05/2016 CXR, chest CT 10/25/2011 FINDINGS: Cardiovascular: Adequate opacification of the pulmonary arterial system. Acute pulmonary emboli noted to the lower lobes with RV/LV ratio of 0.93. Heart is top-normal in size. No pericardial effusion. Minimal aortic atherosclerosis. No aortic aneurysm or dissection. Mediastinum/Nodes: No enlarged mediastinal, hilar, or axillary lymph nodes. Thyroid gland, trachea, and esophagus demonstrate no significant findings. Lungs/Pleura: Minimal subpleural atelectasis noted in the left upper and superior segment of right lower lobe. Stable 5 mm linear nodular density in the left lower lobe series 7 image 66 with tiny lingular subpleural 2 mm nodule. No effusion or pneumothorax. No dominant mass or pulmonary consolidation. Upper Abdomen: No acute abnormality. Musculoskeletal: Midthoracic disc - osteophyte complexes are again noted. No acute nor suspicious osseous lesions. Review of the MIP images confirms the above findings. IMPRESSION: Positive for acute PE with CTevidence of right heart strain (RV/LV Ratio = 0.93) consistent with at least submassive (intermediate risk) PE. The presence of right heart strain has been associated with an increased risk of morbidity and mortality. Critical Value/emergent results were called by telephone at the time of interpretation on 12/05/2016 at 11:14 pm to Dr. Dionne BucySEBASTIAN SIADECKI , who verbally acknowledged these results. Aortic Atherosclerosis (ICD10-I70.0). Electronically Signed   By: Tollie Ethavid  Kwon  M.D.   On: 12/05/2016 23:15    EKG:   Orders placed or performed during the hospital encounter of 12/05/16  . ED EKG  . ED EKG  . EKG 12-Lead  . EKG 12-Lead    IMPRESSION AND PLAN:  Principal Problem:   PE (pulmonary thromboembolism) (HCC) - patient is hemodynamically stable, acutely asymptomatic, we will place him on therapeutic Lovenox, echocardiogram in the morning Active Problems:   Diabetes type 2, uncontrolled (HCC) - sliding scale insulin with corresponding glucose checks   Hypertension - stable, continue home meds   GERD without esophagitis - home dose PPI   CKD (chronic kidney disease), stage III - stable, avoid nephrotoxins and monitor  All the records are reviewed and case discussed with ED provider. Management plans discussed with the patient and/or family.  DVT PROPHYLAXIS: Systemic anticoagulation  GI PROPHYLAXIS: PPI  ADMISSION STATUS: Inpatient  CODE  STATUS: Full Code Status History    This patient does not have a recorded code status. Please follow your organizational policy for patients in this situation.      TOTAL TIME TAKING CARE OF THIS PATIENT: 45 minutes.   Lyndie Vanderloop FIELDING 12/05/2016, 11:45 PM  [employer]  [phone]  CC: Primary care physician; [name], DO  Note:  This document was prepared using Dragon voice recognition software and may include unintentional dictation errors.

## 2016-12-05 NOTE — ED Provider Notes (Signed)
[facility] Emergency Department Provider Note ____________________________________________   First MD Initiated Contact with Patient 12/05/16 2033     (approximate)  I have reviewed the triage vital signs and the nursing notes.   HISTORY  Chief Complaint Shortness of Breath and Chest Pain (tightness)    HPI [name] is a 66 y.o. male with a history of diabetes, hypertension, hyperlipidemia, who presents with chest tightness for the last 2 weeks, intermittent, currently mainly occurring with exertion,and associated with shortness of breath.  He states his symptoms started ever since he got a shot in his back for his spinal stenosis. Patient states that prior to that he had been sitting a lot and less physically active, but has been walking more since he got the shot and his leg pain improved. Patient denies cough or fever, he denies vomiting or diarrhea and denies abdominal pain. He does report some lightheadedness when the chest tightness occurs. He states he thought it was heat-related but today was colder and he still had the symptoms. He contacted his primary care doctor who instructed him to come to the emergency department to be evaluated.  Patient denies any leg swelling or pain. Patient also incidentally reports a peeling rash on his hands and feet that occurred after he took a muscle relaxant a few weeks ago but states this is resolving now and he is no longer than on the medication.    Past Medical History:  Diagnosis Date  . Allergy   . Chronic headaches   . Diabetes mellitus without complication (HCC)   . GERD (gastroesophageal reflux disease)   . Heart burn   . Hyperlipidemia   . Hypertension     Patient Active Problem List   Diagnosis Date Noted  . PE (pulmonary thromboembolism) (HCC) 12/05/2016  . Chronic pain of left knee 10/07/2016  . CKD (chronic kidney disease), stage III 10/07/2016  . Muscle strain 11/17/2015  . Low back  pain 10/08/2015  . Hx of hematuria 07/16/2015  . Ganglion cyst of left foot 01/23/2015  . Smokeless tobacco use 01/12/2015  . Obesity, Class II, BMI 35-39.9 01/12/2015  . Diabetes type 2, uncontrolled (HCC) 10/16/2014  . Hypertension 10/16/2014  . Hyperlipidemia associated with type 2 diabetes mellitus (HCC) 10/16/2014  . Gout 10/16/2014  . GERD without esophagitis 10/16/2014  . Injury of tendon of upper extremity 05/29/2014  . Glenoid labral tear 05/29/2014  . Complete rotator cuff rupture of left shoulder 05/09/2014  . Infraspinatus tenosynovitis 05/09/2014  . Other synovitis and tenosynovitis, right shoulder 05/09/2014    Past Surgical History:  Procedure Laterality Date  . KNEE SURGERY    . SHOULDER SURGERY    . TONSILLECTOMY      Prior to Admission medications   Medication Sig Start Date End Date Taking? Authorizing Provider  allopurinol (ZYLOPRIM) 100 MG tablet Take 1 tablet (100 mg total) by mouth daily. 08/18/16   Karamalegos, Netta NeatAlexander J, DO  amLODipine (NORVASC) 2.5 MG tablet Take 1 tablet (2.5 mg total) by mouth daily. 08/18/16   Karamalegos, Netta NeatAlexander J, DO  colchicine 0.6 MG tablet Take 1 tablet (0.6 mg total) by mouth as needed. 08/18/16   Karamalegos, Netta NeatAlexander J, DO  diclofenac sodium (VOLTAREN) 1 % GEL Apply 2 g topically 3 (three) times daily. 10/07/16   Karamalegos, Netta NeatAlexander J, DO  fluticasone (FLONASE) 50 MCG/ACT nasal spray Place 2 sprays into both nostrils daily. 08/18/16   Karamalegos, Netta NeatAlexander J, DO  Insulin Glargine (LANTUS SOLOSTAR) 100  UNIT/ML Solostar Pen Inject 10 Units into the skin daily at 10 pm. 08/18/16   Althea Charon, Netta Neat, DO  Insulin Pen Needle (PEN NEEDLES) 30G X 8 MM MISC Inject 1 application into the skin daily at 10 pm. 07/25/16   Galen Manila, NP  ipratropium (ATROVENT) 0.06 % nasal spray Place 2 sprays into both nostrils 4 (four) times daily. For up to 5-7 days then stop. 08/25/16   Karamalegos, Netta Neat, DO  linagliptin  (TRADJENTA) 5 MG TABS tablet Take 1 tablet (5 mg total) by mouth daily. 08/18/16   Karamalegos, Netta Neat, DO  lisinopril (PRINIVIL,ZESTRIL) 40 MG tablet Take 1 tablet (40 mg total) by mouth daily. 08/18/16   Karamalegos, Netta Neat, DO  metFORMIN (GLUCOPHAGE) 1000 MG tablet Take 1 tablet (1,000 mg total) by mouth 2 (two) times daily with a meal. 08/25/16   Karamalegos, Netta Neat, DO  omeprazole (PRILOSEC) 20 MG capsule Take 1 capsule (20 mg total) by mouth daily. 08/18/16   [name], DO    Allergies Invokana [canagliflozin] and Prednisone  Family History  Problem Relation Age of Onset  . Cancer Father        lung  . Heart disease Sister   . Heart disease Brother     Social History Social History  Substance Use Topics  . Smoking status: Passive Smoke Exposure - Never Smoker    Packs/day: 1.00    Years: 39.00  . Smokeless tobacco: Current User    Types: Chew     Comment: wife states patient never smoked cigarettes, just chewing tobacco  . Alcohol use No    Review of Systems  Constitutional: No fever/chills Eyes: No visual changes. ENT: No sore throat. Cardiovascular: Positive for chest tightness Respiratory: Positive for shortness of breath. Gastrointestinal: No nausea, no vomiting.  No diarrhea.  Genitourinary: Negative for dysuria.  Musculoskeletal: Negative for back pain. Skin: Negative for rash. Neurological: Negative for headaches, focal weakness or numbness.   ____________________________________________   PHYSICAL EXAM:  VITAL SIGNS: ED Triage Vitals  Enc Vitals Group     BP 12/05/16 1746 (!) 147/76     Pulse Rate 12/05/16 1746 (!) 106     Resp 12/05/16 1746 20     Temp 12/05/16 1746 99.5 F (37.5 C)     Temp Source 12/05/16 1746 Oral     SpO2 12/05/16 1746 98 %     Weight 12/05/16 1746 229 lb (103.9 kg)     Height 12/05/16 1746  (1.803 m)     Head Circumference --      Peak Flow --      Pain Score 12/05/16 1745 3     Pain  Loc --      Pain Edu? --      Excl. in GC? --     Constitutional: Alert and oriented. Well appearing and in no acute distress. Eyes: Conjunctivae are normal.  Head: Atraumatic. Nose: No congestion/rhinnorhea. Mouth/Throat: Mucous membranes are moist.   Neck: Normal range of motion.  Cardiovascular: Normal rate, regular rhythm. Grossly normal heart sounds.  Good peripheral circulation. Respiratory: Normal respiratory effort.  No retractions. Lungs CTAB. Gastrointestinal: Soft and nontender. No distention.  Genitourinary: No CVA tenderness. Musculoskeletal: No lower extremity edema.  Extremities warm and well perfused.  Neurologic:  Normal speech and language. No gross focal neurologic deficits are appreciated.  Skin:  Skin is warm and dry.  Scaling dried pealing skin to bilateral soles of feet, no other rash. Psychiatric:  Mood and affect are normal. Speech and behavior are normal.  ____________________________________________   LABS (all labs ordered are listed, but only abnormal results are displayed)  Labs Reviewed  CBC - Abnormal; Notable for the following:       Result Value   WBC 11.8 (*)    All other components within normal limits  BASIC METABOLIC PANEL - Abnormal; Notable for the following:    Sodium 132 (*)    Chloride 100 (*)    Glucose, Bld 226 (*)    BUN 31 (*)    Creatinine, Ser 1.60 (*)    GFR calc non Af Amer 44 (*)    GFR calc Af Amer 51 (*)    All other components within normal limits  FIBRIN DERIVATIVES D-DIMER ([facility] ONLY) - Abnormal; Notable for the following:    Fibrin derivatives D-dimer [facility]) 3,289.95 (*)    All other components within normal limits  TROPONIN I  TROPONIN I   ____________________________________________  EKG  ED ECG REPORT I, Dionne Bucy, the attending physician, personally viewed and interpreted this ECG.  Date: 12/05/2016 EKG Time: 1750 Rate: 102 Rhythm: sinus tachycardia QRS Axis: left axis deviation Intervals:  normal ST/T Wave abnormalities: Q-wave in V1, no other ST/T changes Narrative Interpretation: no evidence of acute ischemia  ____________________________________________  RADIOLOGY  Chest x-ray with no acute findings  ____________________________________________   PROCEDURES  Procedure(s) performed: No    Critical Care performed: No ____________________________________________   INITIAL IMPRESSION / ASSESSMENT AND PLAN / ED COURSE  Pertinent labs & imaging results that were available during my care of the patient were reviewed by me and considered in my medical decision making (see chart for details).  66 year old male with past medical history as noted presents with 2 weeks of intermittent exertional chest tightness, associated with shortness of breath and occasionally lightheadedness . States symptoms started after he got a shot for his leg pain from spinal stenosis. On exam, patient is very well appearing, vital signs are normal except for borderline temp and slight tachycardia. Exam is otherwise unremarkable.  EKG is nonischemic. Patient's chest x-ray is clear. Overall differential includes ACS, CHF, bronchitis or pneumonia, also less likely or possible PE.  Also consider the patient has just developed decreased exercise tolerance as he states he was much less active prior to getting the shot due to his chronic pain.  Given negative first troponin unremarkable EKG I am low suspicion for ACS; will repeat 3 hour troponin, however if negative, no indication for additional emergent ED workup or admission. Patient has normal O2 sat and the chest x-ray is clear, making CHF and fluid overload unlikely and also ruling out most acute pulmonary processes. Since patient has tachycardia I cannot rule out by [facility] so I will obtain a d-dimer to rule out PE. Patient overall low risk but if d-dimer elevated we will obtain CT chest. If remaining ED workup is negative, anticipate discharge home with  close primary care follow-up for additional outpatient testing for this subacute symptom.     ----------------------------------------- 10:15 PM on 12/05/2016 -----------------------------------------  Repeat troponin is negative, and patient remains asymptomatic at rest, but d-dimer significantly elevated. We will proceed with CT chest.  ----------------------------------------- 11:34 PM on 12/05/2016 -----------------------------------------  CT chest shows a PE with evidence of right heart strain. Patient's vital signs remained stable. We will give Lovenox and admit. Patient signed out to hospitalist Dr. Anne Hahn.  ____________________________________________   FINAL CLINICAL IMPRESSION(S) / ED DIAGNOSES  Final diagnoses:  Other acute pulmonary embolism without acute cor pulmonale (HCC)      NEW MEDICATIONS STARTED DURING THIS VISIT:  New Prescriptions   No medications on file     Note:  This document was prepared using Dragon voice recognition software and may include unintentional dictation errors.    Dionne Bucy, MD 12/05/16 [phone]

## 2016-12-05 NOTE — ED Notes (Signed)
MD at bedside at this time.

## 2016-12-05 NOTE — ED Notes (Signed)
This RN to bedside at this time. Pt visualized in NAD, resting in bed with wife at bedside.

## 2016-12-06 ENCOUNTER — Inpatient Hospital Stay: Payer: Medicare Other

## 2016-12-06 ENCOUNTER — Inpatient Hospital Stay (HOSPITAL_COMMUNITY)
Admit: 2016-12-06 | Discharge: 2016-12-06 | Disposition: A | Discharge status: Home / Self Care | Payer: Medicare Other | Attending: Internal Medicine | Admitting: Internal Medicine

## 2016-12-06 DIAGNOSIS — F17290 Nicotine dependence, other tobacco product, uncomplicated: Secondary | ICD-10-CM | POA: Diagnosis not present

## 2016-12-06 DIAGNOSIS — I2699 Other pulmonary embolism without acute cor pulmonale: Secondary | ICD-10-CM | POA: Diagnosis not present

## 2016-12-06 DIAGNOSIS — E1122 Type 2 diabetes mellitus with diabetic chronic kidney disease: Secondary | ICD-10-CM | POA: Diagnosis not present

## 2016-12-06 DIAGNOSIS — E119 Type 2 diabetes mellitus without complications: Secondary | ICD-10-CM | POA: Diagnosis not present

## 2016-12-06 DIAGNOSIS — I10 Essential (primary) hypertension: Secondary | ICD-10-CM | POA: Diagnosis not present

## 2016-12-06 DIAGNOSIS — K219 Gastro-esophageal reflux disease without esophagitis: Secondary | ICD-10-CM | POA: Diagnosis not present

## 2016-12-06 DIAGNOSIS — R0602 Shortness of breath: Secondary | ICD-10-CM

## 2016-12-06 DIAGNOSIS — E1165 Type 2 diabetes mellitus with hyperglycemia: Secondary | ICD-10-CM | POA: Diagnosis not present

## 2016-12-06 DIAGNOSIS — N183 Chronic kidney disease, stage 3 (moderate): Secondary | ICD-10-CM | POA: Diagnosis not present

## 2016-12-06 LAB — BASIC METABOLIC PANEL
Anion gap: 8 (ref 5–15)
BUN: 27 mg/dL — ABNORMAL HIGH (ref 6–20)
CO2: 25 mmol/L (ref 22–32)
Calcium: 9.3 mg/dL (ref 8.9–10.3)
Chloride: 101 mmol/L (ref 101–111)
Creatinine, Ser: 1.35 mg/dL — ABNORMAL HIGH (ref 0.61–1.24)
GFR calc Af Amer: >60 mL/min (ref >60)
GFR calc non Af Amer: 54 mL/min — ABNORMAL LOW (ref >60)
Glucose, Bld: 228 mg/dL — ABNORMAL HIGH (ref 65–99)
Potassium: 4.6 mmol/L (ref 3.5–5.1)
Sodium: 134 mmol/L — ABNORMAL LOW (ref 135–145)

## 2016-12-06 LAB — CBC
HCT: 39.5 % — ABNORMAL LOW (ref 40.0–52.0)
Hemoglobin: 13.5 g/dL (ref 13.0–18.0)
MCH: 30.8 pg (ref 26.0–34.0)
MCHC: 34.2 g/dL (ref 32.0–36.0)
MCV: 90.3 fL (ref 80.0–100.0)
Platelets: 273 10*3/uL (ref 150–440)
RBC: 4.37 MIL/uL — ABNORMAL LOW (ref 4.40–5.90)
RDW: 14.3 % (ref 11.5–14.5)
WBC: 7.7 10*3/uL (ref 3.8–10.6)

## 2016-12-06 LAB — ECHOCARDIOGRAM COMPLETE
Height: 71 in
Weight: 3736 oz

## 2016-12-06 LAB — GLUCOSE, CAPILLARY
Glucose-Capillary: 186 mg/dL — ABNORMAL HIGH (ref 65–99)
Glucose-Capillary: 239 mg/dL — ABNORMAL HIGH (ref 65–99)
Glucose-Capillary: 265 mg/dL — ABNORMAL HIGH (ref 65–99)

## 2016-12-06 MED ORDER — ENOXAPARIN SODIUM 100 MG/ML ~~LOC~~ SOLN
100.0000 mg | Freq: Two times a day (BID) | SUBCUTANEOUS | Status: DC
Start: 2016-12-06 — End: 2016-12-06

## 2016-12-06 MED ORDER — ACETAMINOPHEN 650 MG RE SUPP: 650.0000 mg | Freq: Four times a day (QID) | RECTAL | Status: DC | PRN

## 2016-12-06 MED ORDER — APIXABAN 5 MG PO TABS: 10.0000 mg | ORAL_TABLET | Freq: Two times a day (BID) | ORAL | Status: DC

## 2016-12-06 MED ORDER — ENOXAPARIN SODIUM 120 MG/0.8ML ~~LOC~~ SOLN
1.0000 mg/kg | Freq: Two times a day (BID) | SUBCUTANEOUS | Status: DC
  Administered 2016-12-06: 105 mg via SUBCUTANEOUS
  Filled 2016-12-06 (×3): qty 0.8

## 2016-12-06 MED ORDER — ACETAMINOPHEN 325 MG PO TABS: 650.0000 mg | ORAL_TABLET | Freq: Four times a day (QID) | ORAL | Status: DC | PRN

## 2016-12-06 MED ORDER — INSULIN ASPART 100 UNIT/ML ~~LOC~~ SOLN: 0.0000 [IU] | Freq: Every day | SUBCUTANEOUS | Status: DC

## 2016-12-06 MED ORDER — APIXABAN 5 MG PO TABS: 5.0000 mg | ORAL_TABLET | Freq: Two times a day (BID) | ORAL | Status: DC

## 2016-12-06 MED ORDER — APIXABAN 5 MG PO TABS: 5.0000 mg | ORAL_TABLET | Freq: Two times a day (BID) | ORAL | 2 refills | Status: DC

## 2016-12-06 MED ORDER — ONDANSETRON HCL 4 MG PO TABS: 4.0000 mg | ORAL_TABLET | Freq: Four times a day (QID) | ORAL | Status: DC | PRN

## 2016-12-06 MED ORDER — LISINOPRIL 20 MG PO TABS
40.0000 mg | ORAL_TABLET | Freq: Every day | ORAL | Status: DC
  Administered 2016-12-06: 40 mg via ORAL
  Filled 2016-12-06: qty 2

## 2016-12-06 MED ORDER — OXYCODONE HCL 5 MG PO TABS: 5.0000 mg | ORAL_TABLET | ORAL | Status: DC | PRN

## 2016-12-06 MED ORDER — INSULIN ASPART 100 UNIT/ML ~~LOC~~ SOLN
0.0000 [IU] | Freq: Three times a day (TID) | SUBCUTANEOUS | Status: DC
  Administered 2016-12-06: 3 [IU] via SUBCUTANEOUS
  Administered 2016-12-06: 5 [IU] via SUBCUTANEOUS
  Filled 2016-12-06 (×2): qty 1

## 2016-12-06 MED ORDER — AMLODIPINE BESYLATE 5 MG PO TABS
2.5000 mg | ORAL_TABLET | Freq: Every day | ORAL | Status: DC
  Administered 2016-12-06: 2.5 mg via ORAL
  Filled 2016-12-06: qty 1

## 2016-12-06 MED ORDER — ALLOPURINOL 100 MG PO TABS
100.0000 mg | ORAL_TABLET | Freq: Every day | ORAL | Status: DC
  Filled 2016-12-06: qty 1

## 2016-12-06 MED ORDER — PANTOPRAZOLE SODIUM 40 MG PO TBEC
40.0000 mg | DELAYED_RELEASE_TABLET | Freq: Every day | ORAL | Status: DC
  Administered 2016-12-06: 40 mg via ORAL
  Filled 2016-12-06: qty 1

## 2016-12-06 MED ORDER — MORPHINE SULFATE (PF) 4 MG/ML IV SOLN: 4.0000 mg | INTRAVENOUS | Status: DC | PRN

## 2016-12-06 MED ORDER — APIXABAN 5 MG PO TABS: 10.0000 mg | ORAL_TABLET | Freq: Two times a day (BID) | ORAL | 0 refills | Status: DC

## 2016-12-06 MED ORDER — INSULIN GLARGINE 100 UNIT/ML ~~LOC~~ SOLN
15.0000 [IU] | Freq: Every day | SUBCUTANEOUS | Status: DC
  Filled 2016-12-06: qty 0.15

## 2016-12-06 MED ORDER — ONDANSETRON HCL 4 MG/2ML IJ SOLN: 4.0000 mg | Freq: Four times a day (QID) | INTRAMUSCULAR | Status: DC | PRN

## 2016-12-06 NOTE — Progress Notes (Signed)
ANTICOAGULATION CONSULT NOTE - Follow up Consult  Pharmacy Consult for lovenox Indication: pulmonary embolus  Allergies  Allergen Reactions  . Invokana [Canagliflozin] Other (See Comments)    dehydrated  . Prednisone Other (See Comments)    Patient Measurements: Height: 5\' 11"  (180.3 cm) Weight: 233 lb 8 oz (105.9 kg) IBW/kg (Calculated) : 75.3   Vital Signs: Temp: 97.7 F (36.5 C) (09/11 0902) Temp Source: Oral (09/11 0902) BP: 130/70 (09/11 0902) Pulse Rate: 90 (09/11 0902)  Labs:  Recent Labs  12/05/16 1744 12/05/16 2054 12/06/16 0519  HGB 13.9  --  13.5  HCT 40.6  --  39.5*  PLT 320  --  273  CREATININE 1.60*  --  1.35*  TROPONINI <0.03 <0.03  --     Estimated Creatinine Clearance: 67.5 mL/min (A) (by C-G formula based on SCr of 1.35 mg/dL (H)).   Medical History: Past Medical History:  Diagnosis Date  . Allergy   . Chronic headaches   . Diabetes mellitus without complication (HCC)   . GERD (gastroesophageal reflux disease)   . Heart burn   . Hyperlipidemia   . Hypertension      Assessment: 66 yo male admitted for SOB and chest tightness. Found to have PE on CT. Being started on lovenox  Goal of Therapy:  Monitor platelets by anticoagulation protocol: Yes   Plan:  Lovenox 1 mg/kg q12h  (105 mg subq q12h) Will monitor Scr and CBC every three days while on lovenox per policy. Hgb and plt count about stable from yesterday  Crist FatHannah Wynn Alldredge, PharmD, BCPS Clinical Pharmacist 12/06/2016 9:39 AM

## 2016-12-06 NOTE — Discharge Summary (Signed)
[facility] -  at [facility], 66 y.o., DOB 1950-06-14, MRN [id number]. Admission date: 12/05/2016 Discharge Date 12/06/2016 Primary MD [name], DO Admitting Physician Oralia Manis, MD  Admission Diagnosis  Other acute pulmonary embolism without acute cor pulmonale (HCC) [I26.99]  Discharge Diagnosis   Principal Problem:   PE (pulmonary thromboembolism) (HCC)   Diabetes type 2, uncontrolled (HCC)   Hypertension   GERD without esophagitis   CKD (chronic kidney disease), stage III   Chronic headaches        Hospital Course [name]  is a 66 y.o. male who presents with shortness of breath and some chest tightness. Patient states that these symptoms started a few weeks ago and have been getting progressively worse. He has been significantly sedentary of late due to right knee pain as well as spinal stenosis. His symptoms started mostly as dyspnea on exertion, but they have gotten worse and he has been more limited in the amount that he can walk before becoming significantly winded. Here in the ED tonight he was found to have PE on CT scan with some right heart strain. Hospitalists were called for admission. Patient was admitted and started on Lovenox therapy. Had Dopplers of his lower extremity which were negative for DVT. Patient is not having any shortness of breath and is ambulated without any difficulty.            Consults  None  Significant Tests:  See full reports for all details     Dg Chest 2 View  Result Date: 12/05/2016 CLINICAL DATA:  Shortness of breath on exertion and chest tightness. EXAM: CHEST  2 VIEW COMPARISON:  10/21/2011 FINDINGS: The cardiac silhouette, mediastinal and hilar contours are normal and stable. The lungs are clear. No pleural effusion. The bony thorax is intact. IMPRESSION: No acute cardiopulmonary findings. Electronically Signed   By: Rudie Meyer M.D.   On: 12/05/2016 18:14   Ct  Angio Chest Pe W And/or Wo Contrast  Result Date: 12/05/2016 CLINICAL DATA:  Chest pain onset 2 weeks ago after steroid injection. Dyspnea over the past 2 days while walking around. EXAM: CT ANGIOGRAPHY CHEST WITH CONTRAST TECHNIQUE: Multidetector CT imaging of the chest was performed using the standard protocol during bolus administration of intravenous contrast. Multiplanar CT image reconstructions and MIPs were obtained to evaluate the vascular anatomy. CONTRAST:  60 cc Isovue 370 IV COMPARISON:  12/05/2016 CXR, chest CT 10/25/2011 FINDINGS: Cardiovascular: Adequate opacification of the pulmonary arterial system. Acute pulmonary emboli noted to the lower lobes with RV/LV ratio of 0.93. Heart is top-normal in size. No pericardial effusion. Minimal aortic atherosclerosis. No aortic aneurysm or dissection. Mediastinum/Nodes: No enlarged mediastinal, hilar, or axillary lymph nodes. Thyroid gland, trachea, and esophagus demonstrate no significant findings. Lungs/Pleura: Minimal subpleural atelectasis noted in the left upper and superior segment of right lower lobe. Stable 5 mm linear nodular density in the left lower lobe series 7 image 66 with tiny lingular subpleural 2 mm nodule. No effusion or pneumothorax. No dominant mass or pulmonary consolidation. Upper Abdomen: No acute abnormality. Musculoskeletal: Midthoracic disc - osteophyte complexes are again noted. No acute nor suspicious osseous lesions. Review of the MIP images confirms the above findings. IMPRESSION: Positive for acute PE with CTevidence of right heart strain (RV/LV Ratio = 0.93) consistent with at least submassive (intermediate risk) PE. The presence of right heart strain has been associated with an increased risk of morbidity and mortality. Critical Value/emergent results were called  by telephone at the time of interpretation on 12/05/2016 at 11:14 pm to Dr. Dionne Bucy , who verbally acknowledged these results. Aortic Atherosclerosis  (ICD10-I70.0). Electronically Signed   By: Tollie Eth M.D.   On: 12/05/2016 23:15   US Venous Img Lower Bilateral  Result Date: 12/06/2016 CLINICAL DATA:  Pulmonary embolus. EXAM: BILATERAL LOWER EXTREMITY VENOUS DOPPLER ULTRASOUND TECHNIQUE: Gray-scale sonography with graded compression, as well as color Doppler and duplex ultrasound were performed to evaluate the lower extremity deep venous systems from the level of the common femoral vein and including the common femoral, femoral, profunda femoral, popliteal and calf veins including the posterior tibial, peroneal and gastrocnemius veins when visible. The superficial great saphenous vein was also interrogated. Spectral Doppler was utilized to evaluate flow at rest and with distal augmentation maneuvers in the common femoral, femoral and popliteal veins. COMPARISON:  CT 12/05/2016 FINDINGS: RIGHT LOWER EXTREMITY Common Femoral Vein: No evidence of thrombus. Normal compressibility, respiratory phasicity and response to augmentation. Saphenofemoral Junction: No evidence of thrombus. Normal compressibility and flow on color Doppler imaging. Profunda Femoral Vein: No evidence of thrombus. Normal compressibility and flow on color Doppler imaging. Femoral Vein: No evidence of thrombus. Normal compressibility, respiratory phasicity and response to augmentation. Popliteal Vein: No evidence of thrombus. Normal compressibility, respiratory phasicity and response to augmentation. Calf Veins: No evidence of thrombus. Normal compressibility and flow on color Doppler imaging. Superficial Great Saphenous Vein: No evidence of thrombus. Normal compressibility and flow on color Doppler imaging. Other Findings:  None. LEFT LOWER EXTREMITY Common Femoral Vein: No evidence of thrombus. Normal compressibility, respiratory phasicity and response to augmentation. Saphenofemoral Junction: No evidence of thrombus. Normal compressibility and flow on color Doppler imaging. Profunda  Femoral Vein: No evidence of thrombus. Normal compressibility and flow on color Doppler imaging. Femoral Vein: No evidence of thrombus. Normal compressibility, respiratory phasicity and response to augmentation. Popliteal Vein: No evidence of thrombus. Normal compressibility, respiratory phasicity and response to augmentation. Calf Veins: No evidence of thrombus. Normal compressibility and flow on color Doppler imaging. Superficial Great Saphenous Vein: No evidence of thrombus. Normal compressibility and flow on color Doppler imaging. Other Findings:  None. IMPRESSION: No evidence of DVT within either lower extremity. Electronically Signed   By: Maisie Fus  Register   On: 12/06/2016 11:05       Today   Subjective:   [name]  atient is breathing much improved  Objective:   Blood pressure 115/74, pulse 85, temperature 98.7 F (37.1 C), temperature source Oral, resp. rate 18, height  (1.803 m), weight 233 lb 8 oz (105.9 kg), SpO2 99 %.  .  Intake/Output Summary (Last 24 hours) at 12/06/16 1522 Last data filed at 12/06/16 1357  Gross per 24 hour  Intake              480 ml  Output             1150 ml  Net             -670 ml    Exam VITAL SIGNS: Blood pressure 115/74, pulse 85, temperature 98.7 F (37.1 C), temperature source Oral, resp. rate 18, height  (1.803 m), weight 233 lb 8 oz (105.9 kg), SpO2 99 %.  GENERAL:  66 y.o.-year-old patient lying in the bed with no acute distress.  EYES: Pupils equal, round, reactive to light and accommodation. No scleral icterus. Extraocular muscles intact.  HEENT: Head atraumatic, normocephalic. Oropharynx and nasopharynx clear.  NECK:  Supple, no  jugular venous distention. No thyroid enlargement, no tenderness.  LUNGS: Normal breath sounds bilaterally, no wheezing, rales,rhonchi or crepitation. No use of accessory muscles of respiration.  CARDIOVASCULAR: S1, S2 normal. No murmurs, rubs, or gallops.  ABDOMEN: Soft, nontender,  nondistended. Bowel sounds present. No organomegaly or mass.  EXTREMITIES: No pedal edema, cyanosis, or clubbing.  NEUROLOGIC: Cranial nerves II through XII are intact. Muscle strength 5/5 in all extremities. Sensation intact. Gait not checked.  PSYCHIATRIC: The patient is alert and oriented x 3.  SKIN: No obvious rash, lesion, or ulcer.   Data Review     CBC w Diff: Lab Results  Component Value Date   WBC 7.7 12/06/2016   HGB 13.5 12/06/2016   HGB 16.2 10/21/2011   HCT 39.5 (L) 12/06/2016   HCT 47.4 10/21/2011   PLT 273 12/06/2016   PLT 230 10/21/2011   CMP: Lab Results  Component Value Date   NA 134 (L) 12/06/2016   NA 136 08/19/2016   NA 135 (L) 10/21/2011   K 4.6 12/06/2016   K 5.0 10/21/2011   CL 101 12/06/2016   CL 104 10/21/2011   CO2 25 12/06/2016   CO2 25 10/21/2011   BUN 27 (H) 12/06/2016   BUN 15 08/19/2016   BUN 22 (H) 10/21/2011   CREATININE 1.35 (H) 12/06/2016   CREATININE 1.19 10/21/2011   PROT 8.0 12/07/2015   ALBUMIN 4.2 12/07/2015   BILITOT 0.3 12/07/2015   ALKPHOS 47 12/07/2015   AST 27 12/07/2015   ALT 25 12/07/2015  .  Micro Results No results found for this or any previous visit (from the past 240 hour(s)).      Code Status Orders        Start     Ordered   12/06/16 0106  Full code  Continuous     12/06/16 0105    Code Status History    Date Active Date Inactive Code Status Order ID Comments User Context   This patient has a current code status but no historical code status.          Follow-up Information    [name], DO Follow up in 1 week(s).   Specialty:  Family Medicine Why:  hospital f/u Contact information: [address] [place] [address] [phone]           Discharge Medications   Allergies as of 12/06/2016      Reactions   Invokana [canagliflozin] Other (See Comments)   dehydrated   Prednisone Other (See Comments)      Medication List    TAKE these medications   allopurinol  100 MG tablet Commonly known as:  ZYLOPRIM Take 1 tablet (100 mg total) by mouth daily. What changed:  when to take this   amLODipine 2.5 MG tablet Commonly known as:  NORVASC Take 1 tablet (2.5 mg total) by mouth daily. What changed:  when to take this   apixaban 5 MG Tabs tablet Commonly known as:  ELIQUIS Take 2 tablets (10 mg total) by mouth 2 (two) times daily.   apixaban 5 MG Tabs tablet Commonly known as:  ELIQUIS Take 1 tablet (5 mg total) by mouth 2 (two) times daily. Start taking on:  12/13/2016   colchicine 0.6 MG tablet Take 1 tablet (0.6 mg total) by mouth as needed. What changed:  reasons to take this   fluticasone 50 MCG/ACT nasal spray Commonly known as:  FLONASE Place 2 sprays into both nostrils daily.   Insulin  Glargine 100 UNIT/ML Solostar Pen Commonly known as:  LANTUS SOLOSTAR Inject 10 Units into the skin daily at 10 pm. What changed:  how much to take   linagliptin 5 MG Tabs tablet Commonly known as:  TRADJENTA Take 1 tablet (5 mg total) by mouth daily.   lisinopril 40 MG tablet Commonly known as:  PRINIVIL,ZESTRIL Take 1 tablet (40 mg total) by mouth daily.   omeprazole 20 MG capsule Commonly known as:  PRILOSEC Take 1 capsule (20 mg total) by mouth daily.   Pen Needles 30G X 8 MM Misc Inject 1 application into the skin daily at 10 pm.            Discharge Care Instructions        Start     Ordered   12/13/16 0000  apixaban (ELIQUIS) 5 MG TABS tablet  2 times daily     12/06/16 1520   12/07/16 0000  apixaban (ELIQUIS) 5 MG TABS tablet  2 times daily     12/06/16 1520         Total Time in preparing paper work, data evaluation and todays exam - 35 minutes  Auburn BilberryPATEL, Zoran Yankee M.D on 12/06/2016 at 3:22 PM  [facility]   Office  [phone]

## 2016-12-06 NOTE — Progress Notes (Signed)
ANTICOAGULATION CONSULT NOTE - Initial Consult  Pharmacy Consult for lovenox Indication: pulmonary embolus  Allergies  Allergen Reactions  . Invokana [Canagliflozin] Other (See Comments)    dehydrated  . Prednisone Other (See Comments)    Patient Measurements: Height: 5\' 11"  (180.3 cm) Weight: 233 lb 8 oz (105.9 kg) IBW/kg (Calculated) : 75.3 Heparin Dosing Weight: 105.9 kg  Vital Signs: Temp: 98.6 F (37 C) (09/11 0043) Temp Source: Oral (09/11 0043) BP: 134/65 (09/11 0043) Pulse Rate: 79 (09/11 0043)  Labs:  Recent Labs  12/05/16 1744 12/05/16 2054  HGB 13.9  --   HCT 40.6  --   PLT 320  --   CREATININE 1.60*  --   TROPONINI <0.03 <0.03    Estimated Creatinine Clearance: 57 mL/min (A) (by C-G formula based on SCr of 1.6 mg/dL (H)).   Medical History: Past Medical History:  Diagnosis Date  . Allergy   . Chronic headaches   . Diabetes mellitus without complication (HCC)   . GERD (gastroesophageal reflux disease)   . Heart burn   . Hyperlipidemia   . Hypertension     Medications:  Scheduled:  . amLODipine  2.5 mg Oral QHS  . enoxaparin (LOVENOX) injection  100 mg Subcutaneous Q12H  . insulin aspart  0-5 Units Subcutaneous QHS  . insulin aspart  0-9 Units Subcutaneous TID WC  . lisinopril  40 mg Oral Daily  . pantoprazole  40 mg Oral Daily    Assessment: Patient admitted for SOB and chest tightness. Found to have PE on CT. Being started on lovenox  Goal of Therapy:  Monitor platelets by anticoagulation protocol: Yes   Plan:  Lovenox 1 mg/kg q12h ~ 100 mg subq q12h. Will monitor Scr and CBC daily while on lovenox.  Thomasene Rippleavid Leodis Alcocer, PharmD, BCPS Clinical Pharmacist 12/06/2016

## 2016-12-06 NOTE — Progress Notes (Signed)
Pt instructed on discharge instructions and medications and verbalized understanding, pts SPO2 checked while ambulating and read 96-100% on room air, pt tolerated ambulating well with no s/s of distress, dyspnea or discomfort. IV catheter discontinued, tip intact, telemetry monitor removed. Suzzette Righterourtney Norleen Xie

## 2016-12-06 NOTE — Discharge Instructions (Signed)
[facility] -  at [facility] ° °DIET:  °Diabetic diet ° °DISCHARGE CONDITION:  °Stable ° °ACTIVITY:  °Activity as tolerated ° °OXYGEN:  °Home Oxygen: No. °  °Oxygen Delivery: room air ° °DISCHARGE LOCATION:  °home  ° ° °ADDITIONAL DISCHARGE INSTRUCTION: ° ° °If you experience worsening of your admission symptoms, develop shortness of breath, life threatening emergency, suicidal or homicidal thoughts you must seek medical attention immediately by calling 911 or calling your MD immediately  if symptoms less severe. ° °You Must read complete instructions/literature along with all the possible adverse reactions/side effects for all the Medicines you take and that have been prescribed to you. Take any new Medicines after you have completely understood and accpet all the possible adverse reactions/side effects.  ° °Please note ° °You were cared for by a hospitalist during your hospital stay. If you have any questions about your discharge medications or the care you received while you were in the hospital after you are discharged, you can call the unit and asked to speak with the hospitalist on call if the hospitalist that took care of you is not available. Once you are discharged, your primary care physician will handle any further medical issues. Please note that NO REFILLS for any discharge medications will be authorized once you are discharged, as it is imperative that you return to your primary care physician (or establish a relationship with a primary care physician if you do not have one) for your aftercare needs so that they can reassess your need for medications and monitor your lab values. ° ° °

## 2016-12-06 NOTE — Progress Notes (Signed)
*  PRELIMINARY RESULTS* Echocardiogram 2D Echocardiogram has been performed.  Cristela BlueHege, Karelyn Brisby 12/06/2016, 2:29 PM

## 2016-12-06 NOTE — Care Management (Signed)
patient admitted with pulmonary embolus and to discharge on Eliquis.  He says he has not been on this medication previously. Provided 30 day coupon.  He verbally confirms that he has pharmacy insurance with his medicare. Is not requiring supplemental oxygen

## 2016-12-06 NOTE — Progress Notes (Addendum)
Inpatient Diabetes Program Recommendations  AACE/ADA: New Consensus Statement on Inpatient Glycemic Control (2015)  Target Ranges:  Prepandial:   less than 140 mg/dL      Peak postprandial:   less than 180 mg/dL (1-2 hours)      Critically ill patients:  140 - 180 mg/dL   Lab Results  Component Value Date   GLUCAP 265 (H) 12/06/2016   HGBA1C 10.9 (H) 08/19/2016    Review of Glycemic ControlResults for [name], [name] (MRN [id number]) as of 12/06/2016 14:15  Ref. Range 12/06/2016 00:38 12/06/2016 07:51 12/06/2016 11:49  Glucose-Capillary Latest Ref Range: 65 - 99 mg/dL 409186 (H) 811239 (H) 914265 (H)    Diabetes history: Type 2 diabetes Outpatient Diabetes medications: Lantus 15 units daily, Tradjenta 5 mg daily, Metformin 1000 mg bid Current orders for Inpatient glycemic control:  Novolog sensitive tid with meals and HS Inpatient Diabetes Program Recommendations:    Please restart home dose of Lantus 15 units daily.  Text page sent to MD.  Will follow.   Thanks, Beryl MeagerJenny Klyn Kroening, RN, BC-ADM Inpatient Diabetes Coordinator Pager [phone] (8a-5p)

## 2016-12-07 ENCOUNTER — Telehealth: Payer: Self-pay

## 2016-12-07 LAB — HIV ANTIBODY (ROUTINE TESTING W REFLEX): HIV Screen 4th Generation wRfx: NONREACTIVE

## 2016-12-07 NOTE — Telephone Encounter (Signed)
[facility]   I have made the 1st attempt to contact the patient or family member in charge, in order to follow up from recently being discharged from the hospital. I left a message on voicemail but I will make another attempt at a different time.

## 2016-12-07 NOTE — Telephone Encounter (Signed)
Transition Care Management Follow-up Telephone Call  Spoke with patients wife  Date discharged? 12/06/2016  How have you been since you were released from the hospital? Good, started the eliquis, getting around a lot better  Do you understand why you were in the hospital: yes  Do you understand the discharge instructions?Yes  Where were you discharged to? Home   Items Reviewed: Medications reviewed: YES Allergies reviewed: YES Dietary changes reviewed: YES Referrals reviewed: YES   Functional Questionnaire:  Activities of Daily Living (ADLs):  feeding, dressing, bathing, transferring.  states they are independent in the following:  States they require assistance with the following: none   Any transportation issues/concerns?: no   Any patient concerns: no   Confirmed importance and date/time of follow-up visits scheduled  Provider Appointment on 12/12/2016 at 8:00am with Dr.Karamalegos. - confirmed with patients wife.  Confirmed with patient if condition begins to worsen call PCP or go to the ER.  Patient was given the office number and encouraged to call back with question or concerns.  : YES

## 2016-12-12 ENCOUNTER — Other Ambulatory Visit: Payer: Self-pay | Admitting: Family Medicine

## 2016-12-12 ENCOUNTER — Ambulatory Visit (INDEPENDENT_AMBULATORY_CARE_PROVIDER_SITE_OTHER): Payer: Medicare Other | Admitting: Family Medicine

## 2016-12-12 ENCOUNTER — Encounter: Payer: Self-pay | Admitting: Family Medicine

## 2016-12-12 ENCOUNTER — Other Ambulatory Visit: Payer: Self-pay

## 2016-12-12 ENCOUNTER — Telehealth: Payer: Self-pay | Admitting: Family Medicine

## 2016-12-12 VITALS — BP 141/77 | HR 74 | Temp 99.0°F | Resp 16 | Ht 71.0 in | Wt 236.0 lb

## 2016-12-12 DIAGNOSIS — E1165 Type 2 diabetes mellitus with hyperglycemia: Secondary | ICD-10-CM

## 2016-12-12 DIAGNOSIS — Z794 Long term (current) use of insulin: Secondary | ICD-10-CM | POA: Diagnosis not present

## 2016-12-12 DIAGNOSIS — E1121 Type 2 diabetes mellitus with diabetic nephropathy: Secondary | ICD-10-CM

## 2016-12-12 DIAGNOSIS — E1169 Type 2 diabetes mellitus with other specified complication: Secondary | ICD-10-CM

## 2016-12-12 DIAGNOSIS — IMO0002 Reserved for concepts with insufficient information to code with codable children: Secondary | ICD-10-CM

## 2016-12-12 DIAGNOSIS — I2699 Other pulmonary embolism without acute cor pulmonale: Secondary | ICD-10-CM

## 2016-12-12 DIAGNOSIS — E785 Hyperlipidemia, unspecified: Principal | ICD-10-CM

## 2016-12-12 NOTE — Telephone Encounter (Signed)
Lab order send to [facility]

## 2016-12-12 NOTE — Progress Notes (Signed)
Subjective:    Patient ID: [name], male    DOB: Aug 01, 1950, 66 y.o.   MRN: [id number]  [name] is a 66 y.o. male presenting on 12/12/2016 for Hospitalization Follow-up (blood clot)   HPI  HOSPITAL FOLLOW-UP VISIT  Hospital/Location: [facility] Date of Admission: 12/05/16 Date of Discharge: 12/06/16 Transitions of care telephone call: Completed 12/07/16 by Marin Roberts LPN  Reason for Admission: Shortness of Breath Primary (+Secondary) Diagnosis: Pulmonary Embolism, w/ R heart strain  [facility] H&P and Discharge Summary have been reviewed - Patient presents today 6 days after recent hospitalization. Brief summary of recent course, patient had symptoms of shortness of breath with exertion and sedentary lifestyle due to low back pain and leg weakness / neuropathy, he presented to ED, was hospitalized after identified PE on CT with some R heart strain. Also had LE dopplers negative for DVT. Treated with anticoagulation with Eliquis, for first time blood clot. - Today reports overall has done well after discharge. Symptoms of shortness of breath has mostly resolved, able to exert himself much better now with ambulation but seems to still be significantly limited by lower extremity weakness and back. - New medications on discharge: Eliquis (  BID for 7 days, then  BID for up to 6 months) - Changes to current meds on discharge: None  Additional history with known uncontrolled DM2, last A1c >10 with DM neuropathy and has had problem with Lumbar OA/DJD and Lumbar radiculopathy also contributing. He still complaints of persistent leg pain and weakness limiting his ambulation. He recently had lumbar epidural injection in 10/2016, and he has follow-up with [facility] this Thursday.  I have reviewed the discharge medication list, and have reconciled the current and discharge medications today.   Current Outpatient Prescriptions:  .  allopurinol (ZYLOPRIM) 100 MG tablet, Take 1  tablet (100 mg total) by mouth daily. (Patient taking differently: Take 100 mg by mouth at bedtime. ), Disp: 90 tablet, Rfl: 3 .  amLODipine (NORVASC) 2.5 MG tablet, Take 1 tablet (2.5 mg total) by mouth daily. (Patient taking differently: Take 2.5 mg by mouth at bedtime. ), Disp: 90 tablet, Rfl: 3 .  Apixaban (ELIQUIS PO), Eliquis 5 mg tablet  TAKE 2 TABLETS BY MOUTH TWICE A DAY FOR 7 DAYS, Disp: , Rfl:  .  [START ON 12/13/2016] apixaban (ELIQUIS) 5 MG TABS tablet, Take 1 tablet (5 mg total) by mouth 2 (two) times daily., Disp: 60 tablet, Rfl: 2 .  colchicine 0.6 MG tablet, Take 1 tablet (0.6 mg total) by mouth as needed. (Patient taking differently: Take 0.6 mg by mouth as needed (gout flare). ), Disp: 90 tablet, Rfl: 3 .  fluticasone (FLONASE) 50 MCG/ACT nasal spray, Place 2 sprays into both nostrils daily., Disp: 48 g, Rfl: 3 .  HYDROcodone-acetaminophen (NORCO/VICODIN) 5-325 MG tablet, TAKE 1-2 TABLET BY MOUTH EVERY 6 HOURS AS NEEDED, Disp: , Rfl: 0 .  Insulin Glargine (LANTUS SOLOSTAR) 100 UNIT/ML Solostar Pen, Inject 10 Units into the skin daily at 10 pm. (Patient taking differently: Inject 15 Units into the skin daily at 10 pm. ), Disp: 45 mL, Rfl: 3 .  Insulin Pen Needle (PEN NEEDLES) 30G X 8 MM MISC, Inject 1 application into the skin daily at 10 pm., Disp: 100 each, Rfl: 12 .  linagliptin (TRADJENTA) 5 MG TABS tablet, Take 1 tablet (5 mg total) by mouth daily., Disp: 90 tablet, Rfl: 3 .  lisinopril (PRINIVIL,ZESTRIL) 40 MG tablet, Take 1 tablet (40 mg total)  by mouth daily., Disp: 90 tablet, Rfl: 3 .  omeprazole (PRILOSEC) 20 MG capsule, Take 1 capsule (20 mg total) by mouth daily., Disp: 90 capsule, Rfl: 3 .  diazepam (VALIUM) 5 MG tablet, TAKE 1 TABLET BY MOUTH 1 HOUR BEFORE PROCEDURE AND 1 TABLET 10 MIN BEFORE IF NEEDED, Disp: , Rfl: 0  ------------------------------------------------------------------------- Social History  Substance Use Topics  . Smoking status: Passive Smoke Exposure  - Never Smoker    Packs/day: 1.00    Years: 39.00  . Smokeless tobacco: Current User    Types: Chew     Comment: wife states patient never smoked cigarettes, just chewing tobacco  . Alcohol use No    Review of Systems Per HPI unless specifically indicated above     Objective:    BP (!) 141/77   Pulse 74   Temp 99 F (37.2 C) (Oral)   Resp 16   Ht  (1.803 m)   Wt 236 lb (107 kg)   BMI 32.92 kg/m   Wt Readings from Last 3 Encounters:  12/12/16 236 lb (107 kg)  12/06/16 233 lb 8 oz (105.9 kg)  10/07/16 240 lb (108.9 kg)    Physical Exam  Constitutional: He is oriented to person, place, and time. He appears well-developed and well-nourished. No distress.  Well-appearing, comfortable, cooperative  HENT:  Head: Normocephalic and atraumatic.  Mouth/Throat: Oropharynx is clear and moist.  Eyes: Conjunctivae are normal. Right eye exhibits no discharge. Left eye exhibits no discharge.  Neck: Normal range of motion.  Cardiovascular: Normal rate, regular rhythm, normal heart sounds and intact distal pulses.   No murmur heard. Pulmonary/Chest: Effort normal and breath sounds normal. No respiratory distress. He has no wheezes. He has no rales.  Good air movement. Speaks full sentences  Musculoskeletal: Normal range of motion. He exhibits no edema.  Lower extremities symmetrical, no erythema, non tender, no edema. Stable bulky knee appearances  Neurological: He is alert and oriented to person, place, and time.  Skin: Skin is warm and dry. No rash noted. He is not diaphoretic. No erythema.  Psychiatric: He has a normal mood and affect. His behavior is normal.  Well groomed, good eye contact, normal speech and thoughts  Nursing note and vitals reviewed.  Results for orders placed or performed during the hospital encounter of 12/05/16  Troponin I  Result Value Ref Range   Troponin I <0.03 <0.03 ng/mL  CBC  Result Value Ref Range   WBC 11.8 (H) 3.8 - 10.6 K/uL   RBC 4.51  4.40 - 5.90 MIL/uL   Hemoglobin 13.9 13.0 - 18.0 g/dL   HCT 16.1 09.6 - 04.5 %   MCV 89.9 80.0 - 100.0 fL   MCH 30.7 26.0 - 34.0 pg   MCHC 34.2 32.0 - 36.0 g/dL   RDW 40.9 81.1 - 91.4 %   Platelets 320 150 - 440 K/uL  Basic metabolic panel  Result Value Ref Range   Sodium 132 (L) 135 - 145 mmol/L   Potassium 5.1 3.5 - 5.1 mmol/L   Chloride 100 (L) 101 - 111 mmol/L   CO2 22 22 - 32 mmol/L   Glucose, Bld 226 (H) 65 - 99 mg/dL   BUN 31 (H) 6 - 20 mg/dL   Creatinine, Ser 7.82 (H) 0.61 - 1.24 mg/dL   Calcium 9.7 8.9 - 95.6 mg/dL   GFR calc non Af Amer 44 (L) >60 mL/min   GFR calc Af Amer 51 (L) >60 mL/min   Anion  gap 10 5 - 15  Fibrin derivatives D-Dimer  Result Value Ref Range   Fibrin derivatives D-dimer ([facility]) 3,289.95 (H) 0.00 - 499.00  Troponin I  Result Value Ref Range   Troponin I <0.03 <0.03 ng/mL  Glucose, capillary  Result Value Ref Range   Glucose-Capillary 186 (H) 65 - 99 mg/dL  HIV antibody (Routine Testing)  Result Value Ref Range   HIV Screen 4th Generation wRfx Non Reactive Non Reactive  Basic metabolic panel  Result Value Ref Range   Sodium 134 (L) 135 - 145 mmol/L   Potassium 4.6 3.5 - 5.1 mmol/L   Chloride 101 101 - 111 mmol/L   CO2 25 22 - 32 mmol/L   Glucose, Bld 228 (H) 65 - 99 mg/dL   BUN 27 (H) 6 - 20 mg/dL   Creatinine, Ser 1.61 (H) 0.61 - 1.24 mg/dL   Calcium 9.3 8.9 - 09.6 mg/dL   GFR calc non Af Amer 54 (L) >60 mL/min   GFR calc Af Amer >60 >60 mL/min   Anion gap 8 5 - 15  CBC  Result Value Ref Range   WBC 7.7 3.8 - 10.6 K/uL   RBC 4.37 (L) 4.40 - 5.90 MIL/uL   Hemoglobin 13.5 13.0 - 18.0 g/dL   HCT 04.5 (L) 40.9 - 81.1 %   MCV 90.3 80.0 - 100.0 fL   MCH 30.8 26.0 - 34.0 pg   MCHC 34.2 32.0 - 36.0 g/dL   RDW 91.4 78.2 - 95.6 %   Platelets 273 150 - 440 K/uL  Glucose, capillary  Result Value Ref Range   Glucose-Capillary 239 (H) 65 - 99 mg/dL  Glucose, capillary  Result Value Ref Range   Glucose-Capillary 265 (H) 65 - 99 mg/dL    ECHOCARDIOGRAM COMPLETE  Result Value Ref Range   Weight 3,736 oz   Height 71 in   BP 115/74 mmHg      Assessment & Plan:   Problem List Items Addressed This Visit    PE (pulmonary thromboembolism) (HCC) - Primary    Clinically improved PE now on anticoagulation 1st known episode PE, uncertain if provoked, seems was sedentary due to back and leg pain. No confirmed DVT. No other history hypercoagulable  Plan: 1. Continue anticoagulation with Eliquis finish  BID for 7 days (last dose 12/13/16) then reduce dose to Eliquis  BID for up to 6 months (end 06/05/17), may consider extend another 6 months to 12 month therapy if needed at that time, may use even lower dose 2.5mg  BID 2. Follow-up 3 months      Relevant Medications   Apixaban (ELIQUIS PO)   Other Relevant Orders   CBC with Differential/Platelet   Diabetes type 2, uncontrolled (HCC)    Limited data recently with hospitalization, seems to have elevated CBG Last A1c >10 - Complication CKD-III, Neuropathy - Failed Invokana, and concern Metformin given CKD  Plan: 1. Continue Lantus as advised 15u nightly but may titrate up if needed up to 20 if fasting AM cbg >150 up by 1 every few days 2. Keep checking CBG 1-3x daily write down log, hand out given, or else I will have very difficult time titrating his lantus - he does not have written log again today 3. Continue Metformin  BID and Tradjenta  4. Check A1c serum today, if still elevated >8 then will send in new rx GLP1 Trulicity 5. Future recommend ASA  daily and consider alternatives to statin or low dose infrequent vs PSK9 if need  6. Follow-up 3 months for DM A1c, med adjust      Relevant Orders   Hemoglobin A1c   BASIC METABOLIC PANEL WITH GFR         Follow up plan: Return in about 3 months (around 03/13/2017) for DM A1c, Neuropathy.  Saralyn Pilar, DO [facility] 12/12/2016, 8:54 AM

## 2016-12-12 NOTE — Assessment & Plan Note (Signed)
Limited data recently with hospitalization, seems to have elevated CBG Last A1c >10 - Complication CKD-III, Neuropathy - Failed Invokana, and concern Metformin given CKD  Plan: 1. Continue Lantus as advised 15u nightly but may titrate up if needed up to 20 if fasting AM cbg >150 up by 1 every few days 2. Keep checking CBG 1-3x daily write down log, hand out given, or else I will have very difficult time titrating his lantus - he does not have written log again today 3. Continue Metformin  BID and Tradjenta  4. Check A1c serum today, if still elevated >8 then will send in new rx GLP1 Trulicity 5. Future recommend ASA  daily and consider alternatives to statin or low dose infrequent vs PSK9 if need 6. Follow-up 3 months for DM A1c, med adjust

## 2016-12-12 NOTE — Telephone Encounter (Signed)
Pt wants to go to [facility] for labs this afternoon.  Please change order to [facility]

## 2016-12-12 NOTE — Patient Instructions (Addendum)
Thank you for coming to the clinic today.  1. Keep taking Eliquis as prescribed for anticoagulation, goal for 6 months for now 2. Check A1c today, if still elevated, will add new medicine, injectable, will call with this 3. Follow-up with [facility] for your back pain 4. Recommend annual eye exam for your diabetes - have them send [place] copy of report  Please schedule a Follow-up Appointment to: Return in about 3 months (around 03/13/2017) for DM A1c, Neuropathy.  If you have any other questions or concerns, please feel free to call the clinic or send a message through MyChart. You may also schedule an earlier appointment if necessary.  Additionally, you may be receiving a survey about your experience at our clinic within a few days to 1 week by e-mail or mail. We value your feedback.  Saralyn Pilar, DO [facility], [place]

## 2016-12-12 NOTE — Assessment & Plan Note (Signed)
Clinically improved PE now on anticoagulation 1st known episode PE, uncertain if provoked, seems was sedentary due to back and leg pain. No confirmed DVT. No other history hypercoagulable  Plan: 1. Continue anticoagulation with Eliquis finish  BID for 7 days (last dose 12/13/16) then reduce dose to Eliquis  BID for up to 6 months (end 06/05/17), may consider extend another 6 months to 12 month therapy if needed at that time, may use even lower dose 2.5mg  BID 2. Follow-up 3 months

## 2016-12-14 DIAGNOSIS — Z794 Long term (current) use of insulin: Secondary | ICD-10-CM | POA: Diagnosis not present

## 2016-12-14 DIAGNOSIS — E1121 Type 2 diabetes mellitus with diabetic nephropathy: Secondary | ICD-10-CM | POA: Diagnosis not present

## 2016-12-14 DIAGNOSIS — I2699 Other pulmonary embolism without acute cor pulmonale: Secondary | ICD-10-CM | POA: Diagnosis not present

## 2016-12-14 DIAGNOSIS — E1165 Type 2 diabetes mellitus with hyperglycemia: Secondary | ICD-10-CM | POA: Diagnosis not present

## 2016-12-15 ENCOUNTER — Other Ambulatory Visit: Payer: Self-pay | Admitting: Family Medicine

## 2016-12-15 DIAGNOSIS — Z794 Long term (current) use of insulin: Principal | ICD-10-CM

## 2016-12-15 DIAGNOSIS — E1165 Type 2 diabetes mellitus with hyperglycemia: Secondary | ICD-10-CM

## 2016-12-15 DIAGNOSIS — E1121 Type 2 diabetes mellitus with diabetic nephropathy: Secondary | ICD-10-CM

## 2016-12-15 DIAGNOSIS — IMO0002 Reserved for concepts with insufficient information to code with codable children: Secondary | ICD-10-CM

## 2016-12-15 LAB — CBC WITH DIFFERENTIAL/PLATELET
Basophils Absolute: 0 10*3/uL (ref 0.0–0.2)
Basos: 1 %
EOS (ABSOLUTE): 0.2 10*3/uL (ref 0.0–0.4)
Eos: 2 %
Hematocrit: 42.7 % (ref 37.5–51.0)
Hemoglobin: 14.1 g/dL (ref 13.0–17.7)
Immature Grans (Abs): 0 10*3/uL (ref 0.0–0.1)
Immature Granulocytes: 0 %
Lymphocytes Absolute: 2.3 10*3/uL (ref 0.7–3.1)
Lymphs: 35 %
MCH: 29.7 pg (ref 26.6–33.0)
MCHC: 33 g/dL (ref 31.5–35.7)
MCV: 90 fL (ref 79–97)
Monocytes Absolute: 0.4 10*3/uL (ref 0.1–0.9)
Monocytes: 6 %
Neutrophils Absolute: 3.7 10*3/uL (ref 1.4–7.0)
Neutrophils: 56 %
Platelets: 335 10*3/uL (ref 150–379)
RBC: 4.75 x10E6/uL (ref 4.14–5.80)
RDW: 15.2 % (ref 12.3–15.4)
WBC: 6.6 10*3/uL (ref 3.4–10.8)

## 2016-12-15 LAB — BASIC METABOLIC PANEL
BUN/Creatinine Ratio: 12 (ref 10–24)
BUN: 15 mg/dL (ref 8–27)
CO2: 20 mmol/L (ref 20–29)
Calcium: 10.1 mg/dL (ref 8.6–10.2)
Chloride: 100 mmol/L (ref 96–106)
Creatinine, Ser: 1.25 mg/dL (ref 0.76–1.27)
GFR calc Af Amer: 69 mL/min/{1.73_m2} (ref >59)
GFR calc non Af Amer: 60 mL/min/{1.73_m2} (ref >59)
Glucose: 147 mg/dL — ABNORMAL HIGH (ref 65–99)
Potassium: 4.4 mmol/L (ref 3.5–5.2)
Sodium: 139 mmol/L (ref 134–144)

## 2016-12-15 LAB — HEMOGLOBIN A1C
Est. average glucose Bld gHb Est-mCnc: 229 mg/dL
Hgb A1c MFr Bld: 9.6 % — ABNORMAL HIGH (ref 4.8–5.6)

## 2016-12-15 MED ORDER — DULAGLUTIDE 0.75 MG/0.5ML ~~LOC~~ SOAJ: 0.7500 mg | SUBCUTANEOUS | 5 refills | Status: DC

## 2016-12-16 DIAGNOSIS — M5136 Other intervertebral disc degeneration, lumbar region: Secondary | ICD-10-CM | POA: Diagnosis not present

## 2016-12-16 MED ORDER — DULAGLUTIDE 0.75 MG/0.5ML ~~LOC~~ SOAJ
0.7500 mg | SUBCUTANEOUS | 5 refills | Status: DC
Start: 2016-12-16 — End: 2017-03-29

## 2016-12-16 NOTE — Addendum Note (Signed)
Addended by: [name] on: 12/16/2016 04:42 PM   Modules accepted: Orders

## 2016-12-26 DIAGNOSIS — R262 Difficulty in walking, not elsewhere classified: Secondary | ICD-10-CM | POA: Diagnosis not present

## 2016-12-26 DIAGNOSIS — M6281 Muscle weakness (generalized): Secondary | ICD-10-CM | POA: Diagnosis not present

## 2016-12-26 DIAGNOSIS — M545 Low back pain: Secondary | ICD-10-CM | POA: Diagnosis not present

## 2017-01-02 DIAGNOSIS — M6281 Muscle weakness (generalized): Secondary | ICD-10-CM | POA: Diagnosis not present

## 2017-01-02 DIAGNOSIS — R262 Difficulty in walking, not elsewhere classified: Secondary | ICD-10-CM | POA: Diagnosis not present

## 2017-01-02 DIAGNOSIS — M545 Low back pain: Secondary | ICD-10-CM | POA: Diagnosis not present

## 2017-01-04 DIAGNOSIS — R262 Difficulty in walking, not elsewhere classified: Secondary | ICD-10-CM | POA: Diagnosis not present

## 2017-01-04 DIAGNOSIS — M6281 Muscle weakness (generalized): Secondary | ICD-10-CM | POA: Diagnosis not present

## 2017-01-04 DIAGNOSIS — M545 Low back pain: Secondary | ICD-10-CM | POA: Diagnosis not present

## 2017-01-09 DIAGNOSIS — R262 Difficulty in walking, not elsewhere classified: Secondary | ICD-10-CM | POA: Diagnosis not present

## 2017-01-09 DIAGNOSIS — M545 Low back pain: Secondary | ICD-10-CM | POA: Diagnosis not present

## 2017-01-09 DIAGNOSIS — M6281 Muscle weakness (generalized): Secondary | ICD-10-CM | POA: Diagnosis not present

## 2017-01-12 DIAGNOSIS — M545 Low back pain: Secondary | ICD-10-CM | POA: Diagnosis not present

## 2017-01-12 DIAGNOSIS — M6281 Muscle weakness (generalized): Secondary | ICD-10-CM | POA: Diagnosis not present

## 2017-01-16 DIAGNOSIS — M545 Low back pain: Secondary | ICD-10-CM | POA: Diagnosis not present

## 2017-01-16 DIAGNOSIS — M6281 Muscle weakness (generalized): Secondary | ICD-10-CM | POA: Diagnosis not present

## 2017-01-24 DIAGNOSIS — M545 Low back pain: Secondary | ICD-10-CM | POA: Diagnosis not present

## 2017-01-24 DIAGNOSIS — M6281 Muscle weakness (generalized): Secondary | ICD-10-CM | POA: Diagnosis not present

## 2017-01-27 DIAGNOSIS — M6281 Muscle weakness (generalized): Secondary | ICD-10-CM | POA: Diagnosis not present

## 2017-01-27 DIAGNOSIS — M545 Low back pain: Secondary | ICD-10-CM | POA: Diagnosis not present

## 2017-01-30 DIAGNOSIS — M6281 Muscle weakness (generalized): Secondary | ICD-10-CM | POA: Diagnosis not present

## 2017-01-30 DIAGNOSIS — M545 Low back pain: Secondary | ICD-10-CM | POA: Diagnosis not present

## 2017-02-06 ENCOUNTER — Telehealth: Payer: Self-pay | Admitting: Family Medicine

## 2017-02-06 DIAGNOSIS — M6281 Muscle weakness (generalized): Secondary | ICD-10-CM | POA: Diagnosis not present

## 2017-02-06 DIAGNOSIS — M545 Low back pain: Secondary | ICD-10-CM | POA: Diagnosis not present

## 2017-02-06 NOTE — Telephone Encounter (Signed)
Pt needs insulin pen needles [facility] [place].

## 2017-02-08 ENCOUNTER — Other Ambulatory Visit: Payer: Self-pay

## 2017-02-08 DIAGNOSIS — E119 Type 2 diabetes mellitus without complications: Secondary | ICD-10-CM

## 2017-02-08 MED ORDER — PEN NEEDLES 30G X 8 MM MISC: 1.0000 "application " | Freq: Every day | 12 refills | Status: DC

## 2017-02-13 DIAGNOSIS — M545 Low back pain: Secondary | ICD-10-CM | POA: Diagnosis not present

## 2017-02-13 DIAGNOSIS — M6281 Muscle weakness (generalized): Secondary | ICD-10-CM | POA: Diagnosis not present

## 2017-02-20 DIAGNOSIS — M6281 Muscle weakness (generalized): Secondary | ICD-10-CM | POA: Diagnosis not present

## 2017-02-20 DIAGNOSIS — M545 Low back pain: Secondary | ICD-10-CM | POA: Diagnosis not present

## 2017-02-21 DIAGNOSIS — E119 Type 2 diabetes mellitus without complications: Secondary | ICD-10-CM | POA: Diagnosis not present

## 2017-02-21 LAB — HM DIABETES EYE EXAM

## 2017-02-27 ENCOUNTER — Encounter: Payer: Self-pay | Admitting: Family Medicine

## 2017-03-02 DIAGNOSIS — M545 Low back pain: Secondary | ICD-10-CM | POA: Diagnosis not present

## 2017-03-02 DIAGNOSIS — M6281 Muscle weakness (generalized): Secondary | ICD-10-CM | POA: Diagnosis not present

## 2017-03-09 DIAGNOSIS — M545 Low back pain: Secondary | ICD-10-CM | POA: Diagnosis not present

## 2017-03-09 DIAGNOSIS — M6281 Muscle weakness (generalized): Secondary | ICD-10-CM | POA: Diagnosis not present

## 2017-03-13 ENCOUNTER — Ambulatory Visit (INDEPENDENT_AMBULATORY_CARE_PROVIDER_SITE_OTHER): Payer: Medicare Other | Admitting: Family Medicine

## 2017-03-13 ENCOUNTER — Encounter: Payer: Self-pay | Admitting: Family Medicine

## 2017-03-13 VITALS — BP 143/78 | HR 79 | Temp 98.7°F | Resp 16 | Ht 71.0 in | Wt 238.0 lb

## 2017-03-13 DIAGNOSIS — IMO0002 Reserved for concepts with insufficient information to code with codable children: Secondary | ICD-10-CM

## 2017-03-13 DIAGNOSIS — I2699 Other pulmonary embolism without acute cor pulmonale: Secondary | ICD-10-CM

## 2017-03-13 DIAGNOSIS — Z794 Long term (current) use of insulin: Secondary | ICD-10-CM

## 2017-03-13 DIAGNOSIS — E1121 Type 2 diabetes mellitus with diabetic nephropathy: Secondary | ICD-10-CM | POA: Diagnosis not present

## 2017-03-13 DIAGNOSIS — E1165 Type 2 diabetes mellitus with hyperglycemia: Secondary | ICD-10-CM | POA: Diagnosis not present

## 2017-03-13 NOTE — Assessment & Plan Note (Signed)
Due for A1c, reportedly improved CBG readings on new GLP1, tolerating well - Complication CKD-III, Neuropathy - Failed Invokana, concern Metformin with CKD  Plan: 1. Ordered [facility] serum A1c for 2 days on 12/19, since he returned to office 2 days too early for 3 month A1c 2. For now no change to med - once review A1c will likely make the following:  - If A1c is approx < 7.5 then may continue current dose Trulicity 0.75mg  weekly, and STOP Tradjenta (since should not add DPP4 with GLP1, he was advised of this change in past, did not seem to adhere, we are waiting on med list now), further REDUCE Lantus insulin from 10u nightly see if can titrate completely off, remain on Metformin 1000mg  BID and Trulicity, future increase dose 0.75 to 1.5mg  if needed after off of insulin  - If A1c is approx >8.0, then will go ahead and INCREASE Trulicity from 0.75 to 1.5mg  weekly inj, and do the other following changes, including, DC Tradjenta, REDUCE Lantus, Cont Metformin  - If A1c >10 or worse not improved, will increase Trulicity 1.5, CONTINUE Lantus and titrate up, also STOP Tradjenta  Follow-up 3 months DM A1c

## 2017-03-13 NOTE — Progress Notes (Signed)
Subjective:    Patient ID: [name], male    DOB: 12/20/1950, [age] y.o.   MRN: [id number]  [name] is a 66 y.o. male presenting on 03/13/2017 for Diabetes   HPI   CHRONIC DM, Type 2: Reports overall improved sugar on new Trulicity GLP1 CBGs: Avg 130-150, Low 100, High 167. Checks CBGs regularly Meds: Trulicity 0.75mg  weekly injection, Lantus insulin 10 u nightly 10mp (was on 15-18 u in past), Metformin 1000mg  BID, Tradjenta 5mg  daily - Tolerating trulicity well, without nausea vomiting - He is asking about med changes now on new med, he did not stop any DM medications, seems to be some confusion of meds, and did not bring copy of meds or bottles today, as we had discussed making change once he got Trulicity - States cost of Trulicity about $250, insulin and tradjenta are expensive as well Reports good compliance. Tolerating well w/o side-effects Currently on ACEi Lifestyle: - Diet (improved diet)  - Exercise (limited) Denies hypoglycemia, polyuria, visual changes, numbness or tingling.  FOLLOW-UP PE, without history of DVT - Last visit with me 12/12/16, for hospital follow-up after hospitalization for new acute PE, treated with anticoagulation for 1st clot, possible provoked with sedentary issue but not consistent, treated on Eliquis 5mg  BID after loading dose since 12/06/16, see prior notes for background information. - Interval update with doing well on anticoagulation without problems - Today patient reports has enough Eliquis to finish through month, that will be 3 months, he is asking duration of this med, states cannot have any back surgery if on anticoagulation, they are considering injections instead - Still admits some weakness in lower extremity, likely from back - he had prior US doppler that did not show any DVT - Denies any recurrence of symptoms dyspnea, chest pain, leg swelling or pain, redness  Additional upate - he will change medicare adv plan in  2019, will need rx sent to [facility], will notify us when ready to send and what his med list is  [facility] 03/13/2017 01/06/2016 11/17/2015  Decreased Interest 0 0 0  Down, Depressed, Hopeless 0 0 0  PHQ - 2 Score 0 0 0    Social History   Tobacco Use  . Smoking status: Passive Smoke Exposure - Never Smoker  . Smokeless tobacco: Current User    Types: Chew  . Tobacco comment: wife states patient never smoked cigarettes, just chewing tobacco  Substance Use Topics  . Alcohol use: No    Alcohol/week: 0.0 oz  . Drug use: No    Review of Systems Per HPI unless specifically indicated above     Objective:    BP (!) 143/78   Pulse 79   Temp 98.7 F (37.1 C) (Oral)   Resp 16   Ht 5\' 11"  (1.803 m)   Wt 238 lb (108 kg)   SpO2 97%   BMI 33.19 kg/m   Wt Readings from Last 3 Encounters:  03/13/17 238 lb (108 kg)  12/12/16 236 lb (107 kg)  12/06/16 233 lb 8 oz (105.9 kg)    Physical Exam  Constitutional: He is oriented to person, place, and time. He appears well-developed and well-nourished. No distress.  Well-appearing, comfortable, cooperative  HENT:  Head: Normocephalic and atraumatic.  Mouth/Throat: Oropharynx is clear and moist.  Neck: Normal range of motion. Neck supple.  Cardiovascular: Normal rate, regular rhythm, normal heart sounds and intact distal pulses.  No murmur heard. Pulmonary/Chest: Effort normal and breath  sounds normal. No respiratory distress. He has no wheezes. He has no rales.  Musculoskeletal: Normal range of motion. He exhibits no edema.  bilateral lower extremity non tender, no edema, no erythema, calves symmetrical  Lymphadenopathy:    He has no cervical adenopathy.  Neurological: He is alert and oriented to person, place, and time.  Skin: Skin is warm and dry. No rash noted. He is not diaphoretic. No erythema.  Psychiatric: His behavior is normal.  Nursing note and vitals reviewed.  Results for orders placed or performed  in visit on 02/27/17  HM DIABETES EYE EXAM  Result Value Ref Range   HM Diabetic Eye Exam No Retinopathy No Retinopathy      Assessment & Plan:   Problem List Items Addressed This Visit    PE (pulmonary thromboembolism) (HCC)    Asymptomatic, now initial PE has resolved on anticoagulation Review with 1st known episode PE, uncertain if provoked, seems was sedentary due to back and leg pain. No confirmed DVT. No other history hypercoagulable  Plan: 1. Continue anticoagulation with Eliquis 5mg  BID for 3 more months through 06/05/17 (for total 6 month anticoagulation) - will likely refill at [facility] when requested new pharmacy - Order future lower extremity doppler US for re-eval if any evidence of DVT - prior imaging did not show any - Follow-up 3 months, May consider extend another 6 months  if needed at that time, may use even lower dose 2.5mg  BID      Uncontrolled type 2 diabetes mellitus with diabetic nephropathy, with long-term current use of insulin (HCC) - Primary    Due for A1c, reportedly improved CBG readings on new GLP1, tolerating well - Complication CKD-III, Neuropathy - Failed Invokana, concern Metformin with CKD  Plan: 1. Ordered [facility] serum A1c for 2 days on 12/19, since he returned to office 2 days too early for 3 month A1c 2. For now no change to med - once review A1c will likely make the following:  - If A1c is approx < 7.5 then may continue current dose Trulicity 0.75mg  weekly, and STOP Tradjenta (since should not add DPP4 with GLP1, he was advised of this change in past, did not seem to adhere, we are waiting on med list now), further REDUCE Lantus insulin from 10u nightly see if can titrate completely off, remain on Metformin 1000mg  BID and Trulicity, future increase dose 0.75 to 1.5mg  if needed after off of insulin  - If A1c is approx >8.0, then will go ahead and INCREASE Trulicity from 0.75 to 1.5mg  weekly inj, and do the other following changes, including,  DC Tradjenta, REDUCE Lantus, Cont Metformin  - If A1c >10 or worse not improved, will increase Trulicity 1.5, CONTINUE Lantus and titrate up, also STOP Tradjenta  Follow-up 3 months DM A1c      Relevant Orders   Hemoglobin A1c      No orders of the defined types were placed in this encounter.   Follow up plan: Return in about 3 months (around 06/11/2017) for Diabetes A1c, follow-up DVT (review US).  Ordered [facility] serum A1c to be drawn in approx 2 days 03/15/17, so that it will be 3 months from last.  Saralyn PilarAlexander Elsy Chiang, DO [facility] 03/13/2017, 1:00 PM

## 2017-03-13 NOTE — Assessment & Plan Note (Addendum)
Asymptomatic, now initial PE has resolved on anticoagulation Review with 1st known episode PE, uncertain if provoked, seems was sedentary due to back and leg pain. No confirmed DVT. No other history hypercoagulable  Plan: 1. Continue anticoagulation with Eliquis 5mg  BID for 3 more months through 06/05/17 (for total 6 month anticoagulation) - will likely refill at [facility] when requested new pharmacy - Order future lower extremity doppler US for re-eval if any evidence of DVT - prior imaging did not show any - Follow-up 3 months, May consider extend another 6 months  if needed at that time, may use even lower dose 2.5mg  BID

## 2017-03-13 NOTE — Patient Instructions (Addendum)
Thank you for coming to the clinic today.  1. Keep up the good work!  2. As discussed, we need a copy of your medication list - and specifically what diabetic medications  Continue Trulicity 0.75mg  once weekly - this is working well  Most likely we will STOP Tradjenta pill daily.  Continue Lantus Insulin 10 unit daily for now - WE WILL TRY TO REDUCE THIS IN THE FUTURE  Continue Metformin  3. Once you have a complete med list let me know, [facility]  [facility]  - Make sure Lab Only appointment is at about 1 week before your next appointment, so that results will be available  For Lab Results, once available within 2-3 days of blood draw, you can can log in to MyChart online to view your results and a brief explanation. Also, we can discuss results at next follow-up visit.  ------------ We will order an Ultrasound of Legs to check for follow-up blood clot DVT, and review this at next visit. To determine how many more months of Anticoagulation blood thinner   Please schedule a Follow-up Appointment to: Return in about 3 months (around 06/11/2017) for Diabetes A1c, follow-up DVT (review US).   If you have any other questions or concerns, please feel free to call the clinic or send a message through MyChart. You may also schedule an earlier appointment if necessary.  Additionally, you may be receiving a survey about your experience at our clinic within a few days to 1 week by e-mail or mail. We value your feedback.  Saralyn PilarAlexander Karamalegos, DO [facility] [place]

## 2017-03-14 ENCOUNTER — Other Ambulatory Visit: Payer: Self-pay | Admitting: Family Medicine

## 2017-03-14 DIAGNOSIS — I2699 Other pulmonary embolism without acute cor pulmonale: Secondary | ICD-10-CM

## 2017-03-14 MED ORDER — APIXABAN 5 MG PO TABS: 5.0000 mg | ORAL_TABLET | Freq: Two times a day (BID) | ORAL | 2 refills | Status: DC

## 2017-03-16 DIAGNOSIS — Z794 Long term (current) use of insulin: Secondary | ICD-10-CM | POA: Diagnosis not present

## 2017-03-16 DIAGNOSIS — E1121 Type 2 diabetes mellitus with diabetic nephropathy: Secondary | ICD-10-CM | POA: Diagnosis not present

## 2017-03-16 DIAGNOSIS — E1165 Type 2 diabetes mellitus with hyperglycemia: Secondary | ICD-10-CM | POA: Diagnosis not present

## 2017-03-17 LAB — HEMOGLOBIN A1C
Est. average glucose Bld gHb Est-mCnc: 146 mg/dL
Hgb A1c MFr Bld: 6.7 % — ABNORMAL HIGH (ref 4.8–5.6)

## 2017-03-29 ENCOUNTER — Other Ambulatory Visit: Payer: Self-pay | Admitting: Family Medicine

## 2017-03-29 DIAGNOSIS — E1121 Type 2 diabetes mellitus with diabetic nephropathy: Secondary | ICD-10-CM

## 2017-03-29 DIAGNOSIS — M1000 Idiopathic gout, unspecified site: Secondary | ICD-10-CM

## 2017-03-29 DIAGNOSIS — K219 Gastro-esophageal reflux disease without esophagitis: Secondary | ICD-10-CM

## 2017-03-29 DIAGNOSIS — E119 Type 2 diabetes mellitus without complications: Secondary | ICD-10-CM

## 2017-03-29 DIAGNOSIS — E1165 Type 2 diabetes mellitus with hyperglycemia: Secondary | ICD-10-CM

## 2017-03-29 DIAGNOSIS — IMO0002 Reserved for concepts with insufficient information to code with codable children: Secondary | ICD-10-CM

## 2017-03-29 DIAGNOSIS — Z794 Long term (current) use of insulin: Secondary | ICD-10-CM

## 2017-03-29 DIAGNOSIS — I2699 Other pulmonary embolism without acute cor pulmonale: Secondary | ICD-10-CM

## 2017-03-29 DIAGNOSIS — J3089 Other allergic rhinitis: Secondary | ICD-10-CM

## 2017-03-29 DIAGNOSIS — I10 Essential (primary) hypertension: Secondary | ICD-10-CM

## 2017-03-29 MED ORDER — FLUTICASONE PROPIONATE 50 MCG/ACT NA SUSP: 2.0000 | Freq: Every day | NASAL | 3 refills | Status: DC

## 2017-03-29 MED ORDER — ALLOPURINOL 100 MG PO TABS: 100.0000 mg | ORAL_TABLET | Freq: Every day | ORAL | 3 refills | Status: DC

## 2017-03-29 MED ORDER — DULAGLUTIDE 0.75 MG/0.5ML ~~LOC~~ SOAJ: 0.7500 mg | SUBCUTANEOUS | 3 refills | Status: DC

## 2017-03-29 MED ORDER — AMLODIPINE BESYLATE 2.5 MG PO TABS: 2.5000 mg | ORAL_TABLET | Freq: Every day | ORAL | 3 refills | Status: DC

## 2017-03-29 MED ORDER — COLCHICINE 0.6 MG PO TABS: 0.6000 mg | ORAL_TABLET | ORAL | 3 refills | Status: DC | PRN

## 2017-03-29 MED ORDER — OMEPRAZOLE 20 MG PO CPDR: 20.0000 mg | DELAYED_RELEASE_CAPSULE | Freq: Every day | ORAL | 3 refills | Status: DC

## 2017-03-29 MED ORDER — APIXABAN 5 MG PO TABS: 5.0000 mg | ORAL_TABLET | Freq: Two times a day (BID) | ORAL | 2 refills | Status: DC

## 2017-03-29 MED ORDER — LISINOPRIL 40 MG PO TABS: 40.0000 mg | ORAL_TABLET | Freq: Every day | ORAL | 3 refills | Status: DC

## 2017-03-29 NOTE — Telephone Encounter (Signed)
[facility]. Sent all rx 90 day supply. No refill on Lantus at this time, plan to taper off. DC'd Tradjenta.  Saralyn PilarAlexander Yaniv Lage, DO [facility] 03/29/2017, 8:24 AM

## 2017-04-04 ENCOUNTER — Telehealth: Payer: Self-pay | Admitting: Family Medicine

## 2017-04-04 ENCOUNTER — Other Ambulatory Visit: Payer: Self-pay

## 2017-04-04 DIAGNOSIS — I2699 Other pulmonary embolism without acute cor pulmonale: Secondary | ICD-10-CM

## 2017-04-04 DIAGNOSIS — E1165 Type 2 diabetes mellitus with hyperglycemia: Secondary | ICD-10-CM

## 2017-04-04 DIAGNOSIS — J3089 Other allergic rhinitis: Secondary | ICD-10-CM

## 2017-04-04 DIAGNOSIS — Z794 Long term (current) use of insulin: Secondary | ICD-10-CM

## 2017-04-04 DIAGNOSIS — I10 Essential (primary) hypertension: Secondary | ICD-10-CM

## 2017-04-04 DIAGNOSIS — M1000 Idiopathic gout, unspecified site: Secondary | ICD-10-CM

## 2017-04-04 DIAGNOSIS — E1121 Type 2 diabetes mellitus with diabetic nephropathy: Secondary | ICD-10-CM

## 2017-04-04 DIAGNOSIS — IMO0002 Reserved for concepts with insufficient information to code with codable children: Secondary | ICD-10-CM

## 2017-04-04 MED ORDER — FLUTICASONE PROPIONATE 50 MCG/ACT NA SUSP: 2.0000 | Freq: Every day | NASAL | 3 refills | Status: DC

## 2017-04-04 MED ORDER — COLCHICINE 0.6 MG PO TABS
0.6000 mg | ORAL_TABLET | ORAL | 3 refills | Status: DC | PRN
Start: 2017-04-04 — End: 2020-01-07

## 2017-04-04 MED ORDER — ALLOPURINOL 100 MG PO TABS: 100.0000 mg | ORAL_TABLET | Freq: Every day | ORAL | 3 refills | Status: DC

## 2017-04-04 MED ORDER — AMLODIPINE BESYLATE 2.5 MG PO TABS: 2.5000 mg | ORAL_TABLET | Freq: Every day | ORAL | 3 refills | Status: DC

## 2017-04-04 MED ORDER — DULAGLUTIDE 0.75 MG/0.5ML ~~LOC~~ SOAJ: 0.7500 mg | SUBCUTANEOUS | 3 refills | Status: DC

## 2017-04-04 MED ORDER — APIXABAN 5 MG PO TABS: 5.0000 mg | ORAL_TABLET | Freq: Two times a day (BID) | ORAL | 2 refills | Status: DC

## 2017-04-04 NOTE — Telephone Encounter (Signed)
error 

## 2017-05-08 IMAGING — US US RENAL
1 series · 13 of 25 positions shown · non-contrast
Comparison: CT 10/25/2011.

CLINICAL DATA: Hematuria.

EXAM:
RENAL / URINARY TRACT ULTRASOUND COMPLETE

[Series 1: us renal · 0.30mm/px · 13 of 56 slices shown]
[im 1/56]
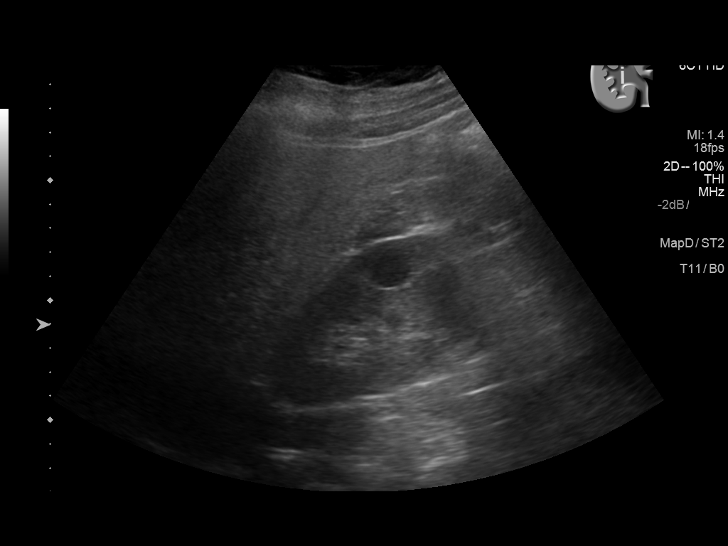
[im 5/56]
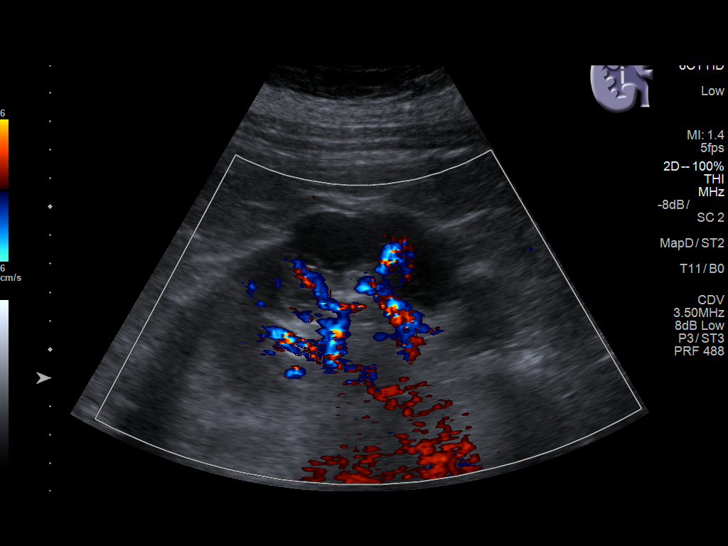
[im 10/56]
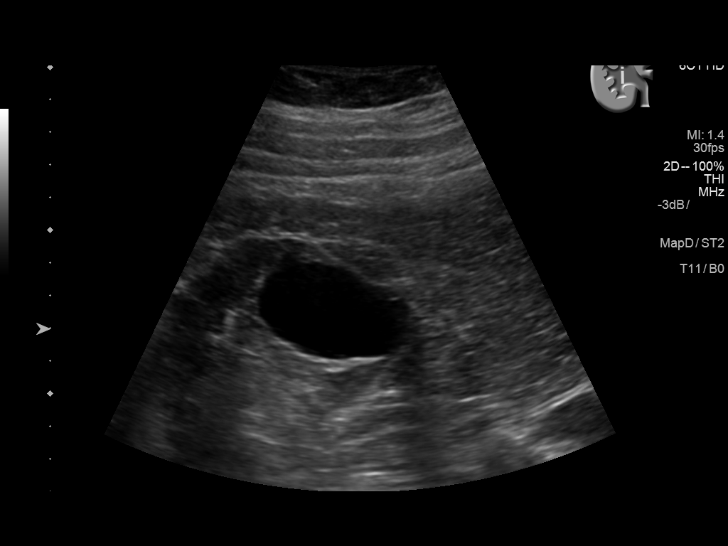
[im 14/56]
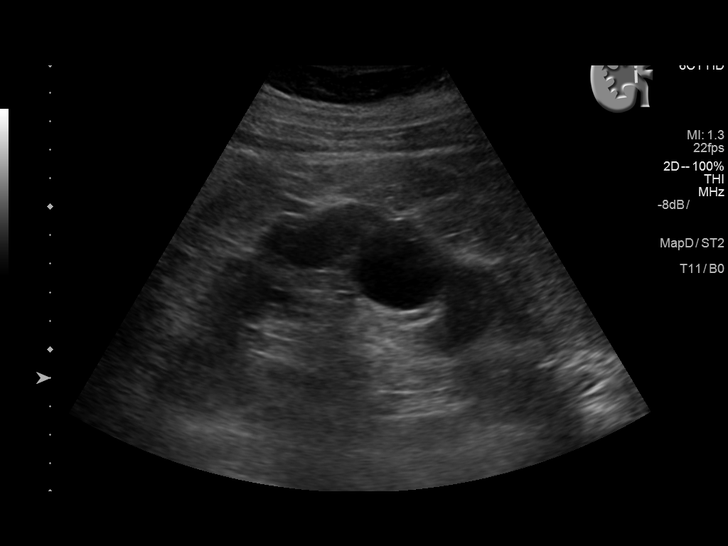
[im 19/56]
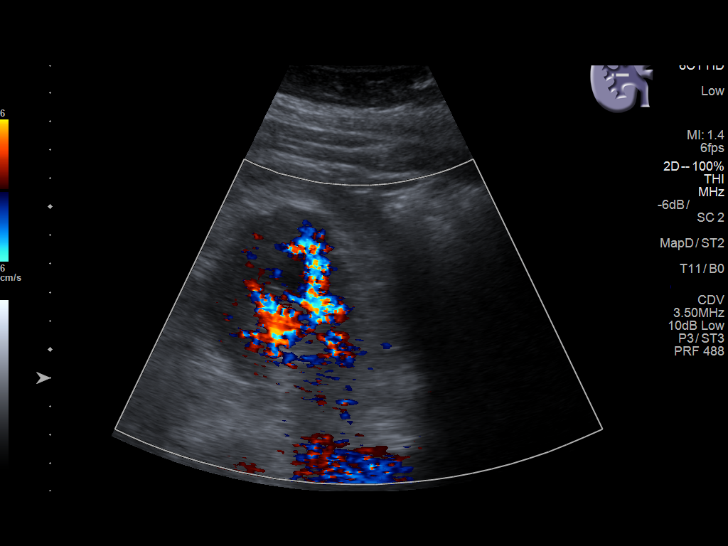
[im 23/56]
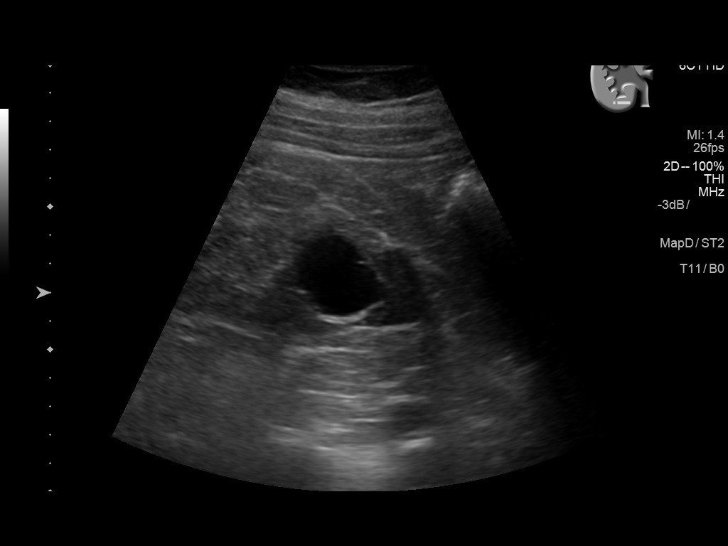
[im 28/56]
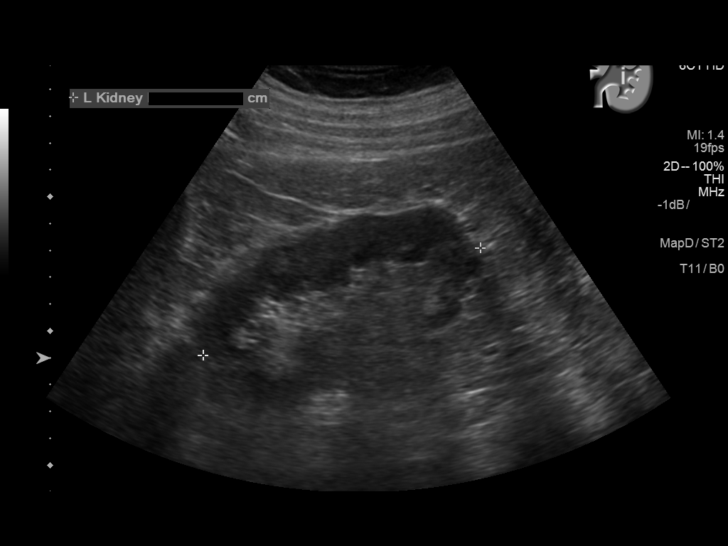
[im 33/56]
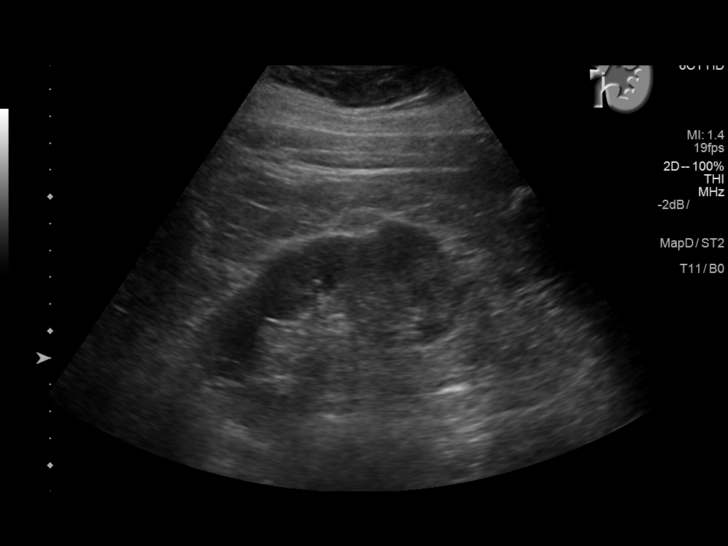
[im 37/56]
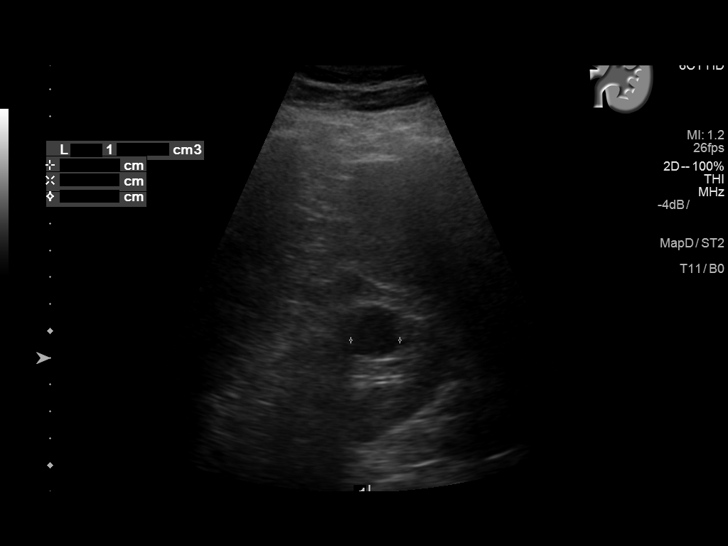
[im 42/56]
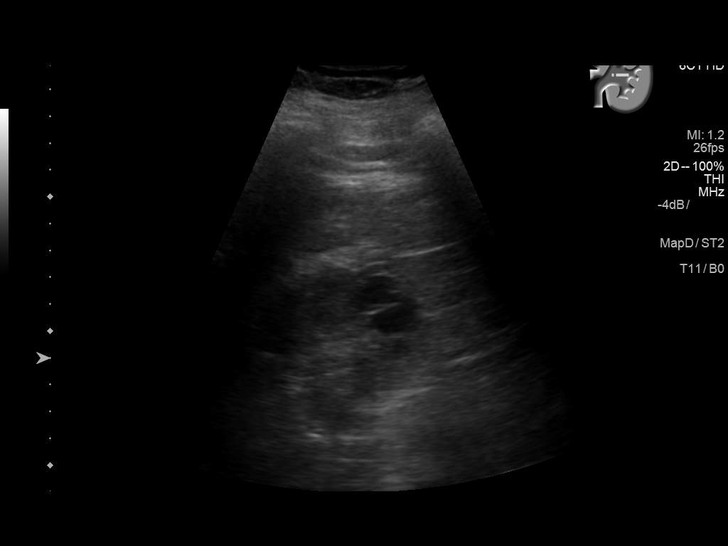
[im 46/56]
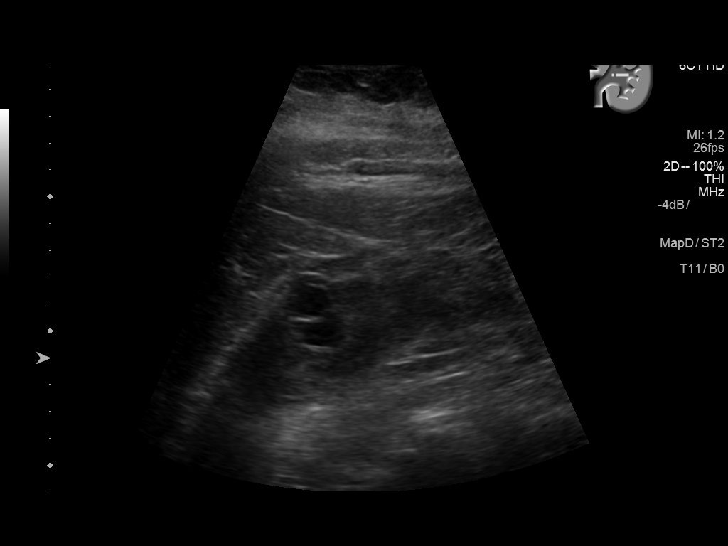
[im 51/56]
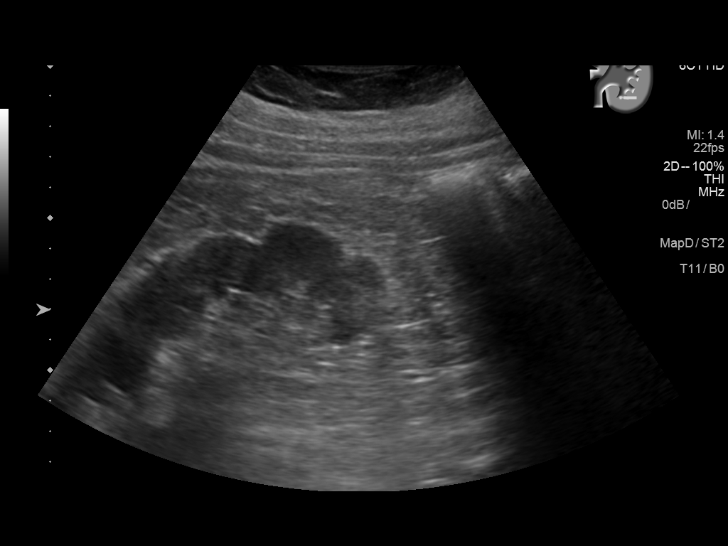
[im 56/56]
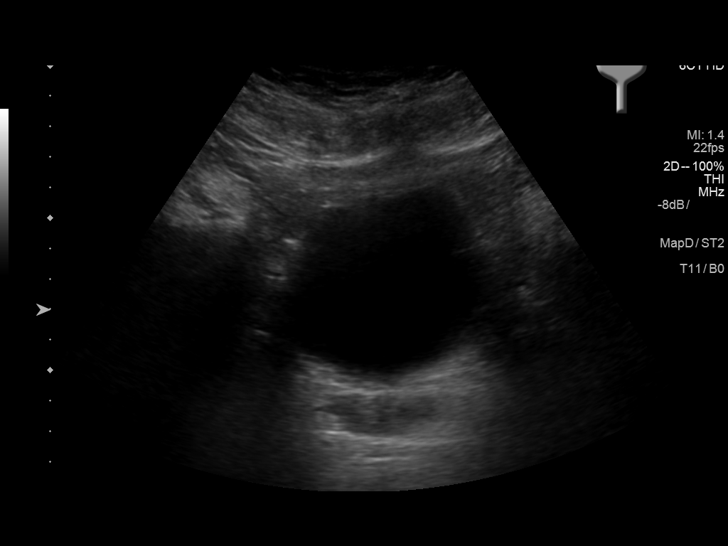

[13 of 25 positions shown; findings below may reference images not displayed]

FINDINGS: Right Kidney:

Length: 12.4 cm. Right kidney is slightly echogenic suggesting
chronic medical renal disease. Lobulated borders are noted. This
could be from fetal lobulation and or scarring. Thinly septated
right mid renal cyst is noted measuring 2.5 cm maximum diameter.
Simple 4.8 cm simple cyst noted in the lower pole right kidney. No
hydronephrosis.

Left Kidney:

Length: 11.9 cm. Left kidney is slightly echogenic. Chronic medical
renal disease cannot be excluded. Lobulated borders are noted. This
could be from fetal lobulation and or scarring. Thinly septated
cm cyst in the midportion left kidney. A 3.7 cm simple cyst in the
upper portion left kidney. No hydronephrosis.

Bladder:

Appears normal for degree of bladder distention.
IMPRESSION: 1. The kidneys are slightly echogenic suggesting the possibility of
chronic medical renal disease. Lobulated renal borders are noted
bilaterally. This could be from fetal lobulation and or scarring. No
hydronephrosis. Bladder nondistended.

2. Cysts are noted both kidneys. These are most likely benign as
described above.

## 2017-05-22 ENCOUNTER — Telehealth: Payer: Self-pay | Admitting: Family Medicine

## 2017-05-22 DIAGNOSIS — IMO0002 Reserved for concepts with insufficient information to code with codable children: Secondary | ICD-10-CM

## 2017-05-22 DIAGNOSIS — Z794 Long term (current) use of insulin: Principal | ICD-10-CM

## 2017-05-22 DIAGNOSIS — E1121 Type 2 diabetes mellitus with diabetic nephropathy: Secondary | ICD-10-CM

## 2017-05-22 DIAGNOSIS — E1165 Type 2 diabetes mellitus with hyperglycemia: Secondary | ICD-10-CM

## 2017-05-22 MED ORDER — METFORMIN HCL 1000 MG PO TABS: 1000.0000 mg | ORAL_TABLET | Freq: Two times a day (BID) | ORAL | 3 refills | Status: DC

## 2017-05-22 NOTE — Telephone Encounter (Signed)
Yes. Patient should still be taking Metformin 1000mg  BID, will send refill to [facility].  Please notify patient.  Saralyn PilarAlexander Karamalegos, DO [facility] 05/22/2017, 1:25 PM

## 2017-05-22 NOTE — Telephone Encounter (Signed)
Pt advised.

## 2017-05-22 NOTE — Telephone Encounter (Signed)
Pt asked if he should still be taking the metformin.  If so, he needs a refill sent to [facility] [address].  Please call [phone]

## 2017-05-26 DIAGNOSIS — I2699 Other pulmonary embolism without acute cor pulmonale: Secondary | ICD-10-CM

## 2017-05-26 HISTORY — DX: Other pulmonary embolism without acute cor pulmonale: I26.99

## 2017-06-01 ENCOUNTER — Ambulatory Visit
Admission: RE | Admit: 2017-06-01 | Discharge: 2017-06-01 | Disposition: A | Discharge status: Home / Self Care | Payer: Medicare Other | Source: Ambulatory Visit | Attending: Family Medicine | Admitting: Family Medicine

## 2017-06-01 DIAGNOSIS — I2699 Other pulmonary embolism without acute cor pulmonale: Secondary | ICD-10-CM | POA: Diagnosis not present

## 2017-06-01 DIAGNOSIS — I82403 Acute embolism and thrombosis of unspecified deep veins of lower extremity, bilateral: Secondary | ICD-10-CM | POA: Diagnosis not present

## 2017-06-15 ENCOUNTER — Ambulatory Visit (INDEPENDENT_AMBULATORY_CARE_PROVIDER_SITE_OTHER): Payer: Medicare Other | Admitting: Family Medicine

## 2017-06-15 ENCOUNTER — Other Ambulatory Visit: Payer: Self-pay | Admitting: Family Medicine

## 2017-06-15 ENCOUNTER — Encounter: Payer: Self-pay | Admitting: Family Medicine

## 2017-06-15 VITALS — BP 138/78 | HR 73 | Temp 98.7°F | Resp 16 | Ht 71.0 in | Wt 239.0 lb

## 2017-06-15 DIAGNOSIS — E785 Hyperlipidemia, unspecified: Secondary | ICD-10-CM

## 2017-06-15 DIAGNOSIS — Z125 Encounter for screening for malignant neoplasm of prostate: Secondary | ICD-10-CM

## 2017-06-15 DIAGNOSIS — N183 Chronic kidney disease, stage 3 unspecified: Secondary | ICD-10-CM

## 2017-06-15 DIAGNOSIS — I10 Essential (primary) hypertension: Secondary | ICD-10-CM

## 2017-06-15 DIAGNOSIS — Z86711 Personal history of pulmonary embolism: Secondary | ICD-10-CM | POA: Diagnosis not present

## 2017-06-15 DIAGNOSIS — E1165 Type 2 diabetes mellitus with hyperglycemia: Secondary | ICD-10-CM

## 2017-06-15 DIAGNOSIS — E1121 Type 2 diabetes mellitus with diabetic nephropathy: Secondary | ICD-10-CM

## 2017-06-15 DIAGNOSIS — Z794 Long term (current) use of insulin: Secondary | ICD-10-CM

## 2017-06-15 DIAGNOSIS — E1169 Type 2 diabetes mellitus with other specified complication: Secondary | ICD-10-CM

## 2017-06-15 DIAGNOSIS — IMO0002 Reserved for concepts with insufficient information to code with codable children: Secondary | ICD-10-CM

## 2017-06-15 DIAGNOSIS — Z1159 Encounter for screening for other viral diseases: Secondary | ICD-10-CM

## 2017-06-15 DIAGNOSIS — R351 Nocturia: Secondary | ICD-10-CM

## 2017-06-15 LAB — POCT GLYCOSYLATED HEMOGLOBIN (HGB A1C): Hemoglobin A1C: 8.3 — AB (ref ?–5.7)

## 2017-06-15 MED ORDER — DULAGLUTIDE 1.5 MG/0.5ML ~~LOC~~ SOAJ: 1.5000 mg | SUBCUTANEOUS | 5 refills | Status: DC

## 2017-06-15 NOTE — Patient Instructions (Addendum)
Thank you for coming to the office today.  You had history of Pulmonary Embolus (blood clot of lung) initial diagnosis in 11/2016. This was treated with anticoagulation Eliquis for 6 months, you have completed this therapy, and repeat ultrasound of lower extremity shows no DVT.  You do not need any more anticoagulation at this time. You may proceed with back epidural injections or procedures as needed.  As discussed, if you have repeat symptoms of blood clot in lung or legs need to follow-up promptly, if you have another episode may require lifelong anticoagulation blood thinner.  A1c 8.3 today, elevated from before  Recommend to improve diet and reduce portion size, as this is likely increasing sugar.  New rx - Dulaglutide (Trulicity) 1.5mg  (this is STRONGER dose) - use this new one on upcoming Monday - still once weekly - then to finish your previous rx 0.75mg  dose you have several pens left, you may use this one the following week, and just ALTERNATE weeks with low dose and high dose - ONLY one dose per week.  - Once finished old pens, continue the new pen 1.5 weekly from now on  You may continue reducing Lantus insulin until you finish it, eventually come off insulin nightly injection  DUE for FASTING BLOOD WORK (no food or drink after midnight before the lab appointment, only water or coffee without cream/sugar on the morning of)  SCHEDULE "Lab Only" visit in the morning at the clinic for lab draw in 3 MONTHS   - Make sure Lab Only appointment is at about 1 week before your next appointment, so that results will be available  For Lab Results, once available within 2-3 days of blood draw, you can can log in to MyChart online to view your results and a brief explanation. Also, we can discuss results at next follow-up visit.   Please schedule a Follow-up Appointment to: Return in about 3 months (around 09/15/2017) for Annual Physical (DM med adjust).  If you have any other questions or  concerns, please feel free to call the office or send a message through MyChart. You may also schedule an earlier appointment if necessary.  Additionally, you may be receiving a survey about your experience at our office within a few days to 1 week by e-mail or mail. We value your feedback.  Saralyn PilarAlexander Karamalegos, DO [facility]

## 2017-06-15 NOTE — Progress Notes (Signed)
Subjective:    Patient ID: [name], male    DOB: 23-Jul-1950, 67 y.o.   MRN: [id number]  [name] is a 67 y.o. male presenting on 06/15/2017 for Diabetes (highest 230 and lowest 136)   HPI  CHRONIC DM, Type 2: Reports thinks his sugar has elevated some, previously was improved on Trulicity. CBGs: prior had been avg 115 to 130, then he stopped checking sugar regularly, now only checking occasionally, avg >150 up to 200 at times. He has some episodes of feeling "shaky" and maybe low sugar, but not always checking, but eats and feels better, he states just seems to "eat too much" Meds: - Trulicity 0.75mg  weekly injection - Lantus insulin 6u nightly (had reduced down to 4u at one point) - Metformin 1000mg  BID - Tolerating meds well, has some mild loose stools at time Reports good compliance. Tolerating well w/o side-effects Currently on ACEi Lifestyle: - Diet (recent worsening diet with inc portion size, not adhering to lifestyle) - Exercise (limited but tries to improve this, will get back treated and this will help) Denies hypoglycemia, polyuria, visual changes, numbness or tingling.  FOLLOW-UP History of 1st PE, without history of DVT Background review, initial dx 12/06/16 in hospital with PE on CT Angio, this was new diagnosis and 1st episode, suspected provoked due to sedentary at that time. He was treated with Eliquis anticoagulation,  see prior notes for background information. - Interval update - Finished Elliquis 1 week ago 06/05/2017, completed total 6 month therapy on anti-coagulation, he had LE venous doppler initially and repeat showed No DVT (06/01/17), results below - Today patient had no issues with eliquis, without bleeding. He is ready to remain off med. He was not taking NSAID while on it, but aware he may take it cautiously while off med. - He is requesting to have clearance to proceed with [facility] for Epidural Lumbar injection, he had  one before and did well for back pain, but now he was advised to not get another injection until off blood thinner - Denies any recurrence of symptoms dyspnea, chest pain, leg swelling or pain, redness   Depression screen [facility] 2/9 03/13/2017 01/06/2016 11/17/2015  Decreased Interest 0 0 0  Down, Depressed, Hopeless 0 0 0  PHQ - 2 Score 0 0 0    Social History   Tobacco Use  . Smoking status: Passive Smoke Exposure - Never Smoker  . Smokeless tobacco: Current User    Types: Chew  . Tobacco comment: wife states patient never smoked cigarettes, just chewing tobacco  Substance Use Topics  . Alcohol use: No    Alcohol/week: 0.0 oz  . Drug use: No    Review of Systems Per HPI unless specifically indicated above     Objective:    BP 138/78 (BP Location: Left Arm, Cuff Size: Normal)   Pulse 73   Temp 98.7 F (37.1 C) (Oral)   Resp 16   Ht 5\' 11"  (1.803 m)   Wt 239 lb (108.4 kg)   BMI 33.33 kg/m   Wt Readings from Last 3 Encounters:  06/15/17 239 lb (108.4 kg)  03/13/17 238 lb (108 kg)  12/12/16 236 lb (107 kg)    Physical Exam  Constitutional: He is oriented to person, place, and time. He appears well-developed and well-nourished. No distress.  Well-appearing, comfortable, cooperative, obese  HENT:  Head: Normocephalic and atraumatic.  Mouth/Throat: Oropharynx is clear and moist.  Neck: Normal range of motion. Neck supple.  Cardiovascular: Normal rate, regular rhythm, normal heart sounds and intact distal pulses.  No murmur heard. Pulmonary/Chest: Effort normal and breath sounds normal. No respiratory distress. He has no wheezes. He has no rales.  Musculoskeletal: Normal range of motion. He exhibits no edema.  bilateral lower extremity non tender, no edema, no erythema, calves symmetrical  Lymphadenopathy:    He has no cervical adenopathy.  Neurological: He is alert and oriented to person, place, and time.  Skin: Skin is warm and dry. No rash noted. He is not  diaphoretic. No erythema.  Psychiatric: He has a normal mood and affect. His behavior is normal.  Well groomed, good eye contact, normal speech and thoughts  Nursing note and vitals reviewed.   I have personally reviewed the radiology report from 06/01/17 on Bilateral Lower Extremity Venous Doppler US.  CLINICAL DATA:  67 year old male with a history of PE and lower extremity DVT  EXAM: BILATERAL LOWER EXTREMITY VENOUS DOPPLER ULTRASOUND  TECHNIQUE: Gray-scale sonography with graded compression, as well as color Doppler and duplex ultrasound were performed to evaluate the lower extremity deep venous systems from the level of the common femoral vein and including the common femoral, femoral, profunda femoral, popliteal and calf veins including the posterior tibial, peroneal and gastrocnemius veins when visible. The superficial great saphenous vein was also interrogated. Spectral Doppler was utilized to evaluate flow at rest and with distal augmentation maneuvers in the common femoral, femoral and popliteal veins.  COMPARISON:  None.  FINDINGS: RIGHT LOWER EXTREMITY  Common Femoral Vein: No evidence of thrombus. Normal compressibility, respiratory phasicity and response to augmentation.  Saphenofemoral Junction: No evidence of thrombus. Normal compressibility and flow on color Doppler imaging.  Profunda Femoral Vein: No evidence of thrombus. Normal compressibility and flow on color Doppler imaging.  Femoral Vein: No evidence of thrombus. Normal compressibility, respiratory phasicity and response to augmentation.  Popliteal Vein: No evidence of thrombus. Normal compressibility, respiratory phasicity and response to augmentation.  Calf Veins: No evidence of thrombus. Normal compressibility and flow on color Doppler imaging.  Superficial Great Saphenous Vein: No evidence of thrombus. Normal compressibility.  Venous Reflux:  None.  Other Findings:   None.  LEFT LOWER EXTREMITY  Common Femoral Vein: No evidence of thrombus. Normal compressibility, respiratory phasicity and response to augmentation.  Saphenofemoral Junction: No evidence of thrombus. Normal compressibility and flow on color Doppler imaging.  Profunda Femoral Vein: No evidence of thrombus. Normal compressibility and flow on color Doppler imaging.  Femoral Vein: No evidence of thrombus. Normal compressibility, respiratory phasicity and response to augmentation.  Popliteal Vein: No evidence of thrombus. Normal compressibility, respiratory phasicity and response to augmentation.  Calf Veins: No evidence of thrombus. Normal compressibility and flow on color Doppler imaging.  Superficial Great Saphenous Vein: No evidence of thrombus. Normal compressibility.  Venous Reflux:  None.  Other Findings:  None.  IMPRESSION: No evidence of deep venous thrombosis.   Electronically Signed   By: Malachy Moan M.D.   On: 06/01/2017 17:06  Recent Labs    12/14/16 1132 03/16/17 0833 06/15/17 0949  HGBA1C 9.6* 6.7* 8.3*    Results for orders placed or performed in visit on 06/15/17  POCT HgB A1C  Result Value Ref Range   Hemoglobin A1C 8.3 (A) 5.7      Assessment & Plan:   Problem List Items Addressed This Visit    History of pulmonary embolus (PE)    Asymptomatic, suspected resolved 1st episode possible provoked PE s/p 6 months anticoagulation Eliquis  Repeat LE Doppler negative for DVT. No confirmed DVT. No other history hypercoagulable  Plan: 1. Discussed risk / benefit of prolonged anticoagulation, may consider lower dose Eliquis for longer use up to 12 months or indefinitely - However, we agree for now to discontinue anticoagulation after 6 months. Remain off Eliquis - He may proceed with spinal epidural injection procedure as planned      Uncontrolled type 2 diabetes mellitus with diabetic nephropathy, with long-term current use of  insulin (HCC) - Primary    Uncontrolled DM with A1c now up to 8.3 from prior 6.7, had improved on new GLP1, now dietary worsening Complications - CKD-III, peripheral neuropathy Off Tradjenta, duplicate therapy/cost, Invokana  Plan:  1. INCREASE dose Trulicity from 0.75 to 1.5mg  weekly inj - reviewed potential side effects, see specific instructions per AVS, he has existing x 3 pens left of 0.75, he will alternate dosing until finished old pens to help reduce potential GI intolerance side effects. Voucher given for 1 month supply free if possible on dose change - Continue Lantus insulin 4-6 units nightly for now - advised titrate down as before if fasting AM CBG < 180 can keep reducing dose and eventually STOP insulin is the goal - Continue Metformin 1000mg  BID 2. Encourage improved lifestyle - low carb, low sugar diet, reduce portion size, continue improving regular exercise 3. Check CBG , bring log to next visit for review 4. Continue ACEi 5. Follow-up 3 months annual + A1c      Relevant Medications   Dulaglutide (TRULICITY) 1.5 MG/0.5ML SOPN   Other Relevant Orders   POCT HgB A1C (Completed)      Meds ordered this encounter  Medications  . Dulaglutide (TRULICITY) 1.5 MG/0.5ML SOPN    Sig: Inject 1.5 mg into the skin once a week.    Dispense:  4 pen    Refill:  5    Dose increase from 0.75 to 1.5    Follow up plan: Return in about 3 months (around 09/15/2017) for Annual Physical (DM med adjust).  Future labs ordered for [facility] 08/2017 - to be released when patient notifies us that he will get lab drawn.  Saralyn PilarAlexander Karamalegos, DO [facility] 06/15/2017, 10:32 AM

## 2017-06-15 NOTE — Assessment & Plan Note (Signed)
Uncontrolled DM with A1c now up to 8.3 from prior 6.7, had improved on new GLP1, now dietary worsening Complications - CKD-III, peripheral neuropathy Off Tradjenta, duplicate therapy/cost, Invokana  Plan:  1. INCREASE dose Trulicity from 0.75 to 1.5mg  weekly inj - reviewed potential side effects, see specific instructions per AVS, he has existing x 3 pens left of 0.75, he will alternate dosing until finished old pens to help reduce potential GI intolerance side effects. Voucher given for 1 month supply free if possible on dose change - Continue Lantus insulin 4-6 units nightly for now - advised titrate down as before if fasting AM CBG < 180 can keep reducing dose and eventually STOP insulin is the goal - Continue Metformin 1000mg  BID 2. Encourage improved lifestyle - low carb, low sugar diet, reduce portion size, continue improving regular exercise 3. Check CBG , bring log to next visit for review 4. Continue ACEi 5. Follow-up 3 months annual + A1c

## 2017-06-15 NOTE — Assessment & Plan Note (Signed)
Asymptomatic, suspected resolved 1st episode possible provoked PE s/p 6 months anticoagulation Eliquis Repeat LE Doppler negative for DVT. No confirmed DVT. No other history hypercoagulable  Plan: 1. Discussed risk / benefit of prolonged anticoagulation, may consider lower dose Eliquis for longer use up to 12 months or indefinitely - However, we agree for now to discontinue anticoagulation after 6 months. Remain off Eliquis - He may proceed with spinal epidural injection procedure as planned

## 2017-06-19 ENCOUNTER — Other Ambulatory Visit: Payer: Self-pay | Admitting: Family Medicine

## 2017-06-19 DIAGNOSIS — E119 Type 2 diabetes mellitus without complications: Secondary | ICD-10-CM

## 2017-06-19 MED ORDER — INSULIN PEN NEEDLE 32G X 4 MM MISC: 3 refills | Status: DC

## 2017-06-21 DIAGNOSIS — M5416 Radiculopathy, lumbar region: Secondary | ICD-10-CM | POA: Diagnosis not present

## 2017-07-05 DIAGNOSIS — M5416 Radiculopathy, lumbar region: Secondary | ICD-10-CM | POA: Diagnosis not present

## 2017-07-05 DIAGNOSIS — M5136 Other intervertebral disc degeneration, lumbar region: Secondary | ICD-10-CM | POA: Diagnosis not present

## 2017-08-29 ENCOUNTER — Ambulatory Visit (INDEPENDENT_AMBULATORY_CARE_PROVIDER_SITE_OTHER): Payer: Medicare Other

## 2017-08-29 ENCOUNTER — Other Ambulatory Visit: Payer: Medicare Other

## 2017-08-29 VITALS — BP 142/78 | HR 78 | Temp 98.8°F | Resp 17 | Ht 71.0 in | Wt 230.2 lb

## 2017-08-29 DIAGNOSIS — E1165 Type 2 diabetes mellitus with hyperglycemia: Secondary | ICD-10-CM

## 2017-08-29 DIAGNOSIS — E1121 Type 2 diabetes mellitus with diabetic nephropathy: Secondary | ICD-10-CM

## 2017-08-29 DIAGNOSIS — Z86711 Personal history of pulmonary embolism: Secondary | ICD-10-CM | POA: Diagnosis not present

## 2017-08-29 DIAGNOSIS — R351 Nocturia: Secondary | ICD-10-CM | POA: Diagnosis not present

## 2017-08-29 DIAGNOSIS — I10 Essential (primary) hypertension: Secondary | ICD-10-CM | POA: Diagnosis not present

## 2017-08-29 DIAGNOSIS — IMO0002 Reserved for concepts with insufficient information to code with codable children: Secondary | ICD-10-CM

## 2017-08-29 DIAGNOSIS — E785 Hyperlipidemia, unspecified: Secondary | ICD-10-CM

## 2017-08-29 DIAGNOSIS — Z0000 Encounter for general adult medical examination without abnormal findings: Secondary | ICD-10-CM | POA: Diagnosis not present

## 2017-08-29 DIAGNOSIS — Z794 Long term (current) use of insulin: Secondary | ICD-10-CM | POA: Diagnosis not present

## 2017-08-29 DIAGNOSIS — Z1159 Encounter for screening for other viral diseases: Secondary | ICD-10-CM | POA: Diagnosis not present

## 2017-08-29 DIAGNOSIS — N183 Chronic kidney disease, stage 3 unspecified: Secondary | ICD-10-CM

## 2017-08-29 DIAGNOSIS — E1169 Type 2 diabetes mellitus with other specified complication: Secondary | ICD-10-CM | POA: Diagnosis not present

## 2017-08-29 NOTE — Patient Instructions (Addendum)
[name] , Thank you for taking time to come for your Medicare Wellness Visit. I appreciate your ongoing commitment to your health goals. Please review the following plan we discussed and let me know if I can assist you in the future.   Screening recommendations/referrals: Colonoscopy: due now- declined Recommended yearly ophthalmology/optometry visit for glaucoma screening and checkup Recommended yearly dental visit for hygiene and checkup  Vaccinations: Influenza vaccine: due 11/2017 Pneumococcal vaccine: due now- declined  Tdap vaccine: up to date Shingles vaccine: shingrix eligible, check with your insurance company for coverage information    Advanced directives: Advance directive discussed with you today. Even though you declined this today please call our office should you change your mind and we can give you the proper paperwork for you to fill out.  Conditions/risks identified: Tobacco cessation discussed  Next appointment: Follow up on 09/08/2017 at 9:00am with [name]. Follow up in one year for your annual wellness exam.   Preventive Care 65 Years and Older, Male Preventive care refers to lifestyle choices and visits with your health care provider that can promote health and wellness. What does preventive care include?  A yearly physical exam. This is also called an annual well check.  Dental exams once or twice a year.  Routine eye exams. Ask your health care provider how often you should have your eyes checked.  Personal lifestyle choices, including:  Daily care of your teeth and gums.  Regular physical activity.  Eating a healthy diet.  Avoiding tobacco and drug use.  Limiting alcohol use.  Practicing safe sex.  Taking low doses of aspirin every day.  Taking vitamin and mineral supplements as recommended by your health care provider. What happens during an annual well check? The services and screenings done by your health care provider during your  annual well check will depend on your age, overall health, lifestyle risk factors, and family history of disease. Counseling  Your health care provider may ask you questions about your:  Alcohol use.  Tobacco use.  Drug use.  Emotional well-being.  Home and relationship well-being.  Sexual activity.  Eating habits.  History of falls.  Memory and ability to understand (cognition).  Work and work Astronomerenvironment. Screening  You may have the following tests or measurements:  Height, weight, and BMI.  Blood pressure.  Lipid and cholesterol levels. These may be checked every 5 years, or more frequently if you are over 66 years old.  Skin check.  Lung cancer screening. You may have this screening every year starting at age 66 if you have a 30-pack-year history of smoking and currently smoke or have quit within the past 15 years.  Fecal occult blood test (FOBT) of the stool. You may have this test every year starting at age 66.  Flexible sigmoidoscopy or colonoscopy. You may have a sigmoidoscopy every 5 years or a colonoscopy every 10 years starting at age 66.  Prostate cancer screening. Recommendations will vary depending on your family history and other risks.  Hepatitis C blood test.  Hepatitis B blood test.  Sexually transmitted disease (STD) testing.  Diabetes screening. This is done by checking your blood sugar (glucose) after you have not eaten for a while (fasting). You may have this done every 1-3 years.  Abdominal aortic aneurysm (AAA) screening. You may need this if you are a current or former smoker.  Osteoporosis. You may be screened starting at age 66 if you are at high risk. Talk with your health care provider  about your test results, treatment options, and if necessary, the need for more tests. Vaccines  Your health care provider may recommend certain vaccines, such as:  Influenza vaccine. This is recommended every year.  Tetanus, diphtheria, and  acellular pertussis (Tdap, Td) vaccine. You may need a Td booster every 10 years.  Zoster vaccine. You may need this after age 33.  Pneumococcal 13-valent conjugate (PCV13) vaccine. One dose is recommended after age 45.  Pneumococcal polysaccharide (PPSV23) vaccine. One dose is recommended after age 27. Talk to your health care provider about which screenings and vaccines you need and how often you need them. This information is not intended to replace advice given to you by your health care provider. Make sure you discuss any questions you have with your health care provider. Document Released: 04/10/2015 Document Revised: 12/02/2015 Document Reviewed: 01/13/2015 Elsevier Interactive Patient Education  2017 [place] Prevention in the Home Falls can cause injuries. They can happen to people of all ages. There are many things you can do to make your home safe and to help prevent falls. What can I do on the outside of my home?  Regularly fix the edges of walkways and driveways and fix any cracks.  Remove anything that might make you trip as you walk through a door, such as a raised step or threshold.  Trim any bushes or trees on the path to your home.  Use bright outdoor lighting.  Clear any walking paths of anything that might make someone trip, such as rocks or tools.  Regularly check to see if handrails are loose or broken. Make sure that both sides of any steps have handrails.  Any raised decks and porches should have guardrails on the edges.  Have any leaves, snow, or ice cleared regularly.  Use sand or salt on walking paths during winter.  Clean up any spills in your garage right away. This includes oil or grease spills. What can I do in the bathroom?  Use night lights.  Install grab bars by the toilet and in the tub and shower. Do not use towel bars as grab bars.  Use non-skid mats or decals in the tub or shower.  If you need to sit down in the shower, use  a plastic, non-slip stool.  Keep the floor dry. Clean up any water that spills on the floor as soon as it happens.  Remove soap buildup in the tub or shower regularly.  Attach bath mats securely with double-sided non-slip rug tape.  Do not have throw rugs and other things on the floor that can make you trip. What can I do in the bedroom?  Use night lights.  Make sure that you have a light by your bed that is easy to reach.  Do not use any sheets or blankets that are too big for your bed. They should not hang down onto the floor.  Have a firm chair that has side arms. You can use this for support while you get dressed.  Do not have throw rugs and other things on the floor that can make you trip. What can I do in the kitchen?  Clean up any spills right away.  Avoid walking on wet floors.  Keep items that you use a lot in easy-to-reach places.  If you need to reach something above you, use a strong step stool that has a grab bar.  Keep electrical cords out of the way.  Do not use floor polish or  wax that makes floors slippery. If you must use wax, use non-skid floor wax.  Do not have throw rugs and other things on the floor that can make you trip. What can I do with my stairs?  Do not leave any items on the stairs.  Make sure that there are handrails on both sides of the stairs and use them. Fix handrails that are broken or loose. Make sure that handrails are as long as the stairways.  Check any carpeting to make sure that it is firmly attached to the stairs. Fix any carpet that is loose or worn.  Avoid having throw rugs at the top or bottom of the stairs. If you do have throw rugs, attach them to the floor with carpet tape.  Make sure that you have a light switch at the top of the stairs and the bottom of the stairs. If you do not have them, ask someone to add them for you. What else can I do to help prevent falls?  Wear shoes that:  Do not have high heels.  Have  rubber bottoms.  Are comfortable and fit you well.  Are closed at the toe. Do not wear sandals.  If you use a stepladder:  Make sure that it is fully opened. Do not climb a closed stepladder.  Make sure that both sides of the stepladder are locked into place.  Ask someone to hold it for you, if possible.  Clearly mark and make sure that you can see:  Any grab bars or handrails.  First and last steps.  Where the edge of each step is.  Use tools that help you move around (mobility aids) if they are needed. These include:  Canes.  Walkers.  Scooters.  Crutches.  Turn on the lights when you go into a dark area. Replace any light bulbs as soon as they burn out.  Set up your furniture so you have a clear path. Avoid moving your furniture around.  If any of your floors are uneven, fix them.  If there are any pets around you, be aware of where they are.  Review your medicines with your doctor. Some medicines can make you feel dizzy. This can increase your chance of falling. Ask your doctor what other things that you can do to help prevent falls. This information is not intended to replace advice given to you by your health care provider. Make sure you discuss any questions you have with your health care provider. Document Released: 01/08/2009 Document Revised: 08/20/2015 Document Reviewed: 04/18/2014 Elsevier Interactive Patient Education  2017 [employer].

## 2017-08-29 NOTE — Progress Notes (Signed)
Subjective:   Daniel SanesKenneth Eugene Berg is a 67 y.o. male who presents for an Initial Medicare Annual Wellness Visit.  Review of Systems   Cardiac Risk Factors include: male gender;hypertension;advanced age (>4955men, 70>65 women);diabetes mellitus;obesity (BMI >30kg/m2);dyslipidemia;smoking/ tobacco exposure    Objective:    Today's Vitals   08/29/17 0840  BP: (!) 142/78  Pulse: 78  Resp: 17  Temp: 98.8 F (37.1 C)  TempSrc: Oral  Weight: 230 lb 3.2 oz (104.4 kg)  Height: 5\' 11"  (1.803 m)   Body mass index is 32.11 kg/m.  Advanced Directives 08/29/2017 12/06/2016 12/06/2016 01/06/2016 12/07/2015  Does Patient Have a Medical Advance Directive? No No - No No  Would patient like information on creating a medical advance directive? No - Patient declined No - Patient declined (No Data) Yes - Transport plannerducational materials given Yes - Educational materials given    Current Medications (verified) Outpatient Encounter Medications as of 08/29/2017  Medication Sig  . allopurinol (ZYLOPRIM) 100 MG tablet Take 1 tablet (100 mg total) by mouth at bedtime.  Marland Kitchen. amLODipine (NORVASC) 2.5 MG tablet Take 1 tablet (2.5 mg total) by mouth at bedtime.  . colchicine 0.6 MG tablet Take 1 tablet (0.6 mg total) by mouth as needed (gout flare).  . Dulaglutide (TRULICITY) 1.5 MG/0.5ML SOPN Inject 1.5 mg into the skin once a week.  . fluticasone (FLONASE) 50 MCG/ACT nasal spray Place 2 sprays into both nostrils daily.  . Insulin Glargine (LANTUS SOLOSTAR) 100 UNIT/ML Solostar Pen Inject 10 Units into the skin daily at 10 pm. (Patient taking differently: Inject 15 Units into the skin daily at 10 pm. )  . Insulin Pen Needle (NOVOFINE PLUS) 32G X 4 MM MISC Use with Lantus insulin daily injection as instructed  . lisinopril (PRINIVIL,ZESTRIL) 40 MG tablet Take 1 tablet (40 mg total) by mouth daily.  . metFORMIN (GLUCOPHAGE) 1000 MG tablet Take 1 tablet (1,000 mg total) by mouth 2 (two) times daily with a meal.  . omeprazole  (PRILOSEC) 20 MG capsule Take 1 capsule (20 mg total) by mouth daily.   No facility-administered encounter medications on file as of 08/29/2017.     Allergies (verified) Crestor [rosuvastatin calcium]; Invokana [canagliflozin]; and Prednisone   History: Past Medical History:  Diagnosis Date  . Allergy   . Chronic headaches   . GERD (gastroesophageal reflux disease)   . Heart burn   . Hyperlipidemia   . Hypertension    Past Surgical History:  Procedure Laterality Date  . KNEE SURGERY    . SHOULDER SURGERY    . TONSILLECTOMY     Family History  Problem Relation Age of Onset  . Cancer Father        lung  . Heart disease Sister   . Heart disease Brother    Social History   Socioeconomic History  . Marital status: Married    Spouse name: Not on file  . Number of children: Not on file  . Years of education: Not on file  . Highest education level: Not on file  Occupational History  . Not on file  Social Needs  . Financial resource strain: Not hard at all  . Food insecurity:    Worry: Never true    Inability: Never true  . Transportation needs:    Medical: No    Non-medical: No  Tobacco Use  . Smoking status: Former Smoker    Types: Cigars  . Smokeless tobacco: Current User    Types: Chew  . Tobacco  comment: wife states patient never smoked cigarettes, just chewing tobacco  Substance and Sexual Activity  . Alcohol use: No    Alcohol/week: 0.0 oz    Comment: beer occasioanlly   . Drug use: No  . Sexual activity: Yes    Birth control/protection: Inserts  Lifestyle  . Physical activity:    Days per week: 0 days    Minutes per session: 0 min  . Stress: Not at all  Relationships  . Social connections:    Talks on phone: More than three times a week    Gets together: More than three times a week    Attends religious service: Never    Active member of club or organization: No    Attends meetings of clubs or organizations: Never    Relationship status: Married    Other Topics Concern  . Not on file  Social History Narrative  . Not on file   Tobacco Counseling Ready to quit: No Counseling given: Yes Comment: wife states patient never smoked cigarettes, just chewing tobacco   Clinical Intake:  Pre-visit preparation completed: Yes  Pain : No/denies pain     Nutritional Status: BMI > 30  Obese Nutritional Risks: Nausea/ vomitting/ diarrhea, Unintentional weight loss(nausea for 3 weeks, lost 9 pounds in 3 months) Diabetes: Yes CBG done?: No Did pt. bring in CBG monitor from home?: No  How often do you need to have someone help you when you read instructions, pamphlets, or other written materials from your doctor or pharmacy?: 4 - Often What is the last grade level you completed in school?: 8th grade  Interpreter Needed?: No  Information entered by :: Cartier Mapel,LPN  Activities of Daily Living In your present state of health, do you have any difficulty performing the following activities: 08/29/2017 12/06/2016  Hearing? Y Y  Comment bilateral hearing aids  -  Vision? N N  Comment - just wear galsses when reading  Difficulty concentrating or making decisions? N N  Walking or climbing stairs? Y Y  Comment back pain  Experience it past two months because of back pain  Dressing or bathing? N N  Doing errands, shopping? N Y  Quarry manager and eating ? N -  Using the Toilet? N -  In the past six months, have you accidently leaked urine? N -  Do you have problems with loss of bowel control? N -  Managing your Medications? N -  Managing your Finances? N -  Housekeeping or managing your Housekeeping? N -  Some recent data might be hidden     Immunizations and Health Maintenance Immunization History  Administered Date(s) Administered  . Tdap 07/25/2016   Health Maintenance Due  Topic Date Due  . Hepatitis C Screening  01/03/1951  . FOOT EXAM  07/25/2017    Patient Care Team: [name], DO as PCP - General  (Family Medicine)  Indicate any recent Medical Services you may have received from other than [facility] providers in the past year (date may be approximate).    Assessment:   This is a routine wellness examination for [name].  Hearing/Vision screen Vision Screening Comments: [facility]es [facility]   Dietary issues and exercise activities discussed: Current Exercise Habits: The patient has a physically strenous job, but has no regular exercise apart from work., Exercise limited by: None identified  Goals    . Quit Smoking     Tobacco cessation discussed      Depression Screen [facility] 2/9  Scores 08/29/2017 03/13/2017 01/06/2016 11/17/2015  PHQ - 2 Score 0 0 0 0    Fall Risk Fall Risk  08/29/2017 03/13/2017 01/06/2016 11/17/2015 10/16/2014  Falls in the past year? No No No Yes No  Number falls in past yr: - - - 1 -  Injury with Fall? - - - No -    Is the patient's home free of loose throw rugs in walkways, pet beds, electrical cords, etc?   yes      Grab bars in the bathroom? no      Handrails on the stairs?   yes      Adequate lighting?   yes  Timed Get Up and Go performed: Completed in 8 seconds with no use of assistive devices, steady gait. No intervention needed at this time.   Cognitive Function:     6CIT Screen 08/29/2017  What Year? 0 points  What month? 0 points  What time? 0 points  Count back from 20 0 points  Months in reverse 0 points  Repeat phrase 2 points  Total Score 2    Screening Tests Health Maintenance  Topic Date Due  . Hepatitis C Screening  February 11, 1951  . FOOT EXAM  07/25/2017  . COLONOSCOPY  08/30/2018 (Originally 08/27/2015)  . PNA vac Low Risk Adult (1 of 2 - PCV13) 08/30/2018 (Originally 08/26/2016)  . INFLUENZA VACCINE  10/26/2017  . HEMOGLOBIN A1C  12/16/2017  . OPHTHALMOLOGY EXAM  02/21/2018  . TETANUS/TDAP  07/26/2026    Declined pneumo vaccines   Qualifies for Shingles Vaccine? Yes, discussed shingrix vaccine  Cancer  Screenings: Lung: Low Dose CT Chest recommended if Age 67-80 years, 30 pack-year currently smoking OR have quit w/in 15years. Patient does not qualify. Colorectal: declined screenings  Additional Screenings:  Hepatitis C Screening: ordered      Plan:    I have personally reviewed and addressed the Medicare Annual Wellness questionnaire and have noted the following in the patient's chart:  A. Medical and social history B. Use of alcohol, tobacco or illicit drugs  C. Current medications and supplements D. Functional ability and status E.  Nutritional status F.  Physical activity G. Advance directives H. List of other physicians I.  Hospitalizations, surgeries, and ER visits in previous 12 months J.  Vitals K. Screenings such as hearing and vision if needed, cognitive and depression L. Referrals and appointments   In addition, I have reviewed and discussed with patient certain preventive protocols, quality metrics, and best practice recommendations. A written personalized care plan for preventive services as well as general preventive health recommendations were provided to patient.   Signed,  Marin Roberts, LPN Nurse Health Advisor   Nurse Notes: nausea for last 3 weeks - mostly when he tries to get up and do stuff, especially outside.losing appetite as well and occasional diarrhea, no vomiting.  Has tried pepto with only a little relieve. Has lost 9 pounds since march 21st. Started when trulicty was increased.   Due for diabetic foot exam- CPE on 09/08/2017

## 2017-08-30 LAB — CBC WITH DIFFERENTIAL/PLATELET
Basophils Absolute: 0 10*3/uL (ref 0.0–0.2)
Basos: 1 %
EOS (ABSOLUTE): 0.1 10*3/uL (ref 0.0–0.4)
Eos: 2 %
Hematocrit: 38.6 % (ref 37.5–51.0)
Hemoglobin: 13.2 g/dL (ref 13.0–17.7)
Immature Grans (Abs): 0 10*3/uL (ref 0.0–0.1)
Immature Granulocytes: 0 %
Lymphocytes Absolute: 1.5 10*3/uL (ref 0.7–3.1)
Lymphs: 29 %
MCH: 31.4 pg (ref 26.6–33.0)
MCHC: 34.2 g/dL (ref 31.5–35.7)
MCV: 92 fL (ref 79–97)
Monocytes Absolute: 0.3 10*3/uL (ref 0.1–0.9)
Monocytes: 6 %
Neutrophils Absolute: 3.3 10*3/uL (ref 1.4–7.0)
Neutrophils: 62 %
Platelets: 282 10*3/uL (ref 150–450)
RBC: 4.2 x10E6/uL (ref 4.14–5.80)
RDW: 14.7 % (ref 12.3–15.4)
WBC: 5.2 10*3/uL (ref 3.4–10.8)

## 2017-08-30 LAB — LIPID PANEL
Chol/HDL Ratio: 4.6 ratio (ref 0.0–5.0)
Cholesterol, Total: 182 mg/dL (ref 100–199)
HDL: 40 mg/dL (ref >39)
LDL Calculated: 122 mg/dL — ABNORMAL HIGH (ref 0–99)
Triglycerides: 101 mg/dL (ref 0–149)
VLDL Cholesterol Cal: 20 mg/dL (ref 5–40)

## 2017-08-30 LAB — HEMOGLOBIN A1C
Est. average glucose Bld gHb Est-mCnc: 232 mg/dL
Hgb A1c MFr Bld: 9.7 % — ABNORMAL HIGH (ref 4.8–5.6)

## 2017-08-30 LAB — HEPATITIS C ANTIBODY: Hep C Virus Ab: <0.1 s/co ratio (ref 0.0–0.9)

## 2017-08-30 LAB — PSA: Prostate Specific Ag, Serum: 4.5 ng/mL — ABNORMAL HIGH (ref 0.0–4.0)

## 2017-09-05 ENCOUNTER — Telehealth: Payer: Self-pay | Admitting: Family Medicine

## 2017-09-05 DIAGNOSIS — IMO0002 Reserved for concepts with insufficient information to code with codable children: Secondary | ICD-10-CM

## 2017-09-05 DIAGNOSIS — E1121 Type 2 diabetes mellitus with diabetic nephropathy: Secondary | ICD-10-CM

## 2017-09-05 DIAGNOSIS — Z794 Long term (current) use of insulin: Principal | ICD-10-CM

## 2017-09-05 DIAGNOSIS — E1165 Type 2 diabetes mellitus with hyperglycemia: Secondary | ICD-10-CM

## 2017-09-05 MED ORDER — DULAGLUTIDE 0.75 MG/0.5ML ~~LOC~~ SOAJ: 0.7500 mg | SUBCUTANEOUS | 3 refills | Status: DC

## 2017-09-05 NOTE — Telephone Encounter (Signed)
Patient seen by Daniel Robertsiffany Hill LPN recently for AMW, he had labs drawn, called him back with results, A1c significantly elevated 9.7, he has not adhered to diet by his report, this is main change, he was on higher dose Trulicity 1.5mg  weekly but had nausea GI intolerance, and he lowered lantus insulin. I advised him that he should reduce dose Trulicity from 1.5 down to 0.75 weekly again - new rx sent back to pharmacy, and increase lantus insulin up to 8u nightly for now adjust according, in future we may need to adjust medication and SWITCH GLP1 agents to Bydureon to reduce GI intolerance if need.  Follow-up scheduled currently for 09/2017 - he should keep apt if has concerns or questions not doing well, otherwise he needs to re-schedule to have a follow-up 3 months for fasting labs A1c and PSA and follow-up in office.  His PSA was 4.5, no prior elevation, we will re-check this in future to trend it, if still elevated and new symptoms will consider Urology referral  He does not have any known history of prostate cancer screening past by prior provide on our EHR  Saralyn PilarAlexander Karamalegos, DO [facility] 09/05/2017, 6:26 PM

## 2017-09-06 ENCOUNTER — Other Ambulatory Visit: Payer: Self-pay | Admitting: Family Medicine

## 2017-09-06 DIAGNOSIS — E1165 Type 2 diabetes mellitus with hyperglycemia: Secondary | ICD-10-CM

## 2017-09-06 DIAGNOSIS — Z794 Long term (current) use of insulin: Principal | ICD-10-CM

## 2017-09-06 DIAGNOSIS — E1121 Type 2 diabetes mellitus with diabetic nephropathy: Secondary | ICD-10-CM

## 2017-09-06 DIAGNOSIS — IMO0002 Reserved for concepts with insufficient information to code with codable children: Secondary | ICD-10-CM

## 2017-09-06 MED ORDER — DULAGLUTIDE 0.75 MG/0.5ML ~~LOC~~ SOAJ: 0.7500 mg | SUBCUTANEOUS | 3 refills | Status: DC

## 2017-09-08 ENCOUNTER — Encounter: Payer: Medicare Other | Admitting: Family Medicine

## 2017-09-08 ENCOUNTER — Telehealth: Payer: Self-pay | Admitting: Family Medicine

## 2017-09-08 ENCOUNTER — Other Ambulatory Visit: Payer: Self-pay | Admitting: Family Medicine

## 2017-09-08 DIAGNOSIS — IMO0002 Reserved for concepts with insufficient information to code with codable children: Secondary | ICD-10-CM

## 2017-09-08 DIAGNOSIS — Z794 Long term (current) use of insulin: Principal | ICD-10-CM

## 2017-09-08 DIAGNOSIS — E1121 Type 2 diabetes mellitus with diabetic nephropathy: Secondary | ICD-10-CM

## 2017-09-08 DIAGNOSIS — E1165 Type 2 diabetes mellitus with hyperglycemia: Secondary | ICD-10-CM

## 2017-09-08 MED ORDER — DULAGLUTIDE 0.75 MG/0.5ML ~~LOC~~ SOAJ: 0.7500 mg | SUBCUTANEOUS | 3 refills | Status: DC

## 2017-09-08 NOTE — Telephone Encounter (Signed)
[facility] wont be able to fill it until the 11 of July. Per wife pt has only one more shot left.  Please Advise.   Dulaglutide (TRULICITY) 0.75 MG/0.5ML SOPN [[id number]][[id number]]

## 2017-09-08 NOTE — Telephone Encounter (Signed)
Left message for patient to call back  

## 2017-09-08 NOTE — Telephone Encounter (Signed)
Had [facility] request Rx send.

## 2017-10-03 ENCOUNTER — Encounter: Payer: Self-pay | Admitting: Family Medicine

## 2017-10-03 ENCOUNTER — Ambulatory Visit (INDEPENDENT_AMBULATORY_CARE_PROVIDER_SITE_OTHER): Payer: Medicare Other | Admitting: Family Medicine

## 2017-10-03 VITALS — BP 120/62 | HR 79 | Resp 16 | Ht 71.0 in | Wt 236.6 lb

## 2017-10-03 DIAGNOSIS — J3089 Other allergic rhinitis: Secondary | ICD-10-CM | POA: Diagnosis not present

## 2017-10-03 DIAGNOSIS — E1165 Type 2 diabetes mellitus with hyperglycemia: Secondary | ICD-10-CM

## 2017-10-03 DIAGNOSIS — R972 Elevated prostate specific antigen [PSA]: Secondary | ICD-10-CM | POA: Diagnosis not present

## 2017-10-03 DIAGNOSIS — N183 Chronic kidney disease, stage 3 unspecified: Secondary | ICD-10-CM

## 2017-10-03 DIAGNOSIS — E669 Obesity, unspecified: Secondary | ICD-10-CM | POA: Diagnosis not present

## 2017-10-03 DIAGNOSIS — E1169 Type 2 diabetes mellitus with other specified complication: Secondary | ICD-10-CM

## 2017-10-03 DIAGNOSIS — IMO0002 Reserved for concepts with insufficient information to code with codable children: Secondary | ICD-10-CM

## 2017-10-03 DIAGNOSIS — Z794 Long term (current) use of insulin: Secondary | ICD-10-CM | POA: Diagnosis not present

## 2017-10-03 DIAGNOSIS — M5416 Radiculopathy, lumbar region: Secondary | ICD-10-CM | POA: Diagnosis not present

## 2017-10-03 DIAGNOSIS — G720 Drug-induced myopathy: Secondary | ICD-10-CM | POA: Insufficient documentation

## 2017-10-03 DIAGNOSIS — I10 Essential (primary) hypertension: Secondary | ICD-10-CM | POA: Diagnosis not present

## 2017-10-03 DIAGNOSIS — M791 Myalgia, unspecified site: Secondary | ICD-10-CM

## 2017-10-03 DIAGNOSIS — E785 Hyperlipidemia, unspecified: Secondary | ICD-10-CM | POA: Diagnosis not present

## 2017-10-03 DIAGNOSIS — E1121 Type 2 diabetes mellitus with diabetic nephropathy: Secondary | ICD-10-CM | POA: Diagnosis not present

## 2017-10-03 DIAGNOSIS — T466X5A Adverse effect of antihyperlipidemic and antiarteriosclerotic drugs, initial encounter: Secondary | ICD-10-CM

## 2017-10-03 MED ORDER — IPRATROPIUM BROMIDE 0.06 % NA SOLN: 2.0000 | Freq: Three times a day (TID) | NASAL | 3 refills | Status: DC | PRN

## 2017-10-03 MED ORDER — EXENATIDE ER 2 MG/0.85ML ~~LOC~~ AUIJ: 2.0000 mg | AUTO-INJECTOR | SUBCUTANEOUS | 3 refills | Status: DC

## 2017-10-03 NOTE — Assessment & Plan Note (Addendum)
Well-controlled HTN Complication with CKD-III    Plan:  1. Continue current BP regimen - Amlodipine 2.5mg daily, Lisinopril 40mg daily 2. Encourage improved lifestyle - low sodium diet, regular exercise improve as tolerated 3. Continue monitor BP outside office, bring readings to next visit, if persistently >140/90 or new symptoms notify office sooner 

## 2017-10-03 NOTE — Assessment & Plan Note (Addendum)
Still Uncontrolled DM with hyperglycemia, A1c 9.7 from prior 6.7 > 8.3 Complications - CKD-III nephropathy, other neuropathy from spine but less likely DM neuropathy, other including hyperlipidemia, GERD, obesity - increases risk of future cardiovascular complications  Off Tradjenta, duplicate therapy/cost, Invokana Failed Trulicity 0.75 and 1.5mg  weekly d/t GI intolerance  Plan:  1. - DISCONTINUE Trulicity 0.75 - START new GLP - Bydureon BCise 2mg  weekly [facility] Trulicity 0.75 and 1.5 and only fill new rx sent today Bydureon BCise - Continue current therapy - metformin 1000mg  BID, Lantus 10u nightly may titrate dose up as tolerated based on fasting CBG if >150 persistently gradual increase 1-2 u per week 2. Encourage improved lifestyle - low carb, low sugar diet, reduce portion size, continue improving regular exercise 3. Check CBG, bring log to next visit for review 4. Continue ACEi 5. DM Foot exam done today 6. Follow-up 3 months [facility] A1c

## 2017-10-03 NOTE — Assessment & Plan Note (Signed)
Clinically stable with some persistent chronic low back pain and radiculopathy symptoms into bilateral upper legs Prior improved on ESI x 2 per Dr JordanMali ([facility]) Advised he may return to them to consider repeat injection in future vs surgical options

## 2017-10-03 NOTE — Assessment & Plan Note (Signed)
Uncontrolled cholesterol elevated LDL otherwise normal but improving lifestyle Last lipid panel 08/2017 Calculated ASCVD 10 yr risk score elevated as diabetic >25%  Plan: 1. Consider statin therapy - decline for now with changing DM medicine improving control - avoid new start med, and he declines d/t myalgias 2. Encourage improved lifestyle - low carb/cholesterol, reduce portion size, continue improving regular exercise

## 2017-10-03 NOTE — Assessment & Plan Note (Signed)
Stable CKD-III Secondary to age, Diabetes, HTN, possibly NSAID use  Plan: 1. Improve hydration, limit NSAIDs orally 2. Monitor Cr trend 3. Control DM - switched to new GLP agent 4. Follow-up q 6-12 months Cr

## 2017-10-03 NOTE — Progress Notes (Addendum)
Subjective:    Patient ID: [name], male    DOB: Mar 18, 1951, 67 y.o.   MRN: [id number]  [name] is a 67 y.o. male presenting on 10/03/2017 for Diabetes   HPI   CHRONIC DM, Type 2 with nephropathy / CKD-III - Last visit with me for same problem 05/2017, treated with increased Trulicity from 0.75 up to 1.5 due to still not optimal A1c control, however he could not tolerate it due to GI side effects nausea vomiting, back in 08/2017 he was advised to reduce dose to lower amount 0.75 again to see if this improved side effects, see prior notes for background information. - Interval update with still having side effects GI intolerance on lower dose, and sugars not as improved either, interested to change medications as discussed - Today patient reports still has some elevated sugar readings, recently limited checking sugar, he admits had poor diet on vacation CBGs - none to report today Meds: - Trulicity 0.75mg  weekly injection - Lantus insulin 10u nightly (had reduced down to 4u at one point) - Metformin 1000mg  BID - Tolerating meds well, has some mild loose stools at time Reports good compliance. Tolerating well w/o side-effects Currently on ACEi Lifestyle: - Diet (Gradual improved diet, with reduced portions and reduced sweets, still admits not at goal) - Exercise (limited stays active but ultimately his back does limit his activity) Denies hypoglycemia  Recurrent Rhinosinusitis Reports persistent recurrent problem of sinus congestion and pressure, without thicker congestion or purulence. Denies ear pain or pressure. He has some sinus headaches at times. Tried Flonase without significant improvement. Not taking allergy pill, worse with outdoor exposure. Some runny nose at times and congestion  Chronic Low Back pain / Lumbar radiculopathy Followed by [facility], he has received prior epidural steroid injections ESI lumbar spine x 2 before, last one 05/2017 from Dr  Jimmy Jordan in [place], and he did well with each injection, but thinks some of it may be worn off and wondering if a 3rd injection would benefit him, they told him that he may require surgery in the future - he admits some recurrent symptoms in lower legs and thighs with some weakness - takes occasional NSAID for this - Denies foot numbness or tingling  Elevated PSA No prior prostate CA screening on file. Last PSA 4.5 (09/2017) mildly elevated without result for comparison. Currently asymptomatic without BPH LUTS. No known family history of prostate CA.   CHRONIC HTN: Reports no new concerns. Current Meds - Amlodipine 2.5mg  daily, Lisinopril 40mg  daily   Reports good compliance, took meds today. Tolerating well, w/o complaints.  HYPERLIPIDEMIA: - Reports no concerns. Last lipid panel 08/2017, elevated LDL - Not on statin. Prior statin induced myalgia side effect from Rosuvastatin Crestor   Health Maintenance:  Currently UTD no due health maintenance items - he has declined Pneumonia vaccine at this time, will reconsider next year.  UTD Negative Hepatitis C screening UTD TDap UTD DM eye exam  Depression screen [facility] 2/9 08/29/2017 03/13/2017 01/06/2016  Decreased Interest 0 0 0  Down, Depressed, Hopeless 0 0 0  PHQ - 2 Score 0 0 0    Past Medical History:  Diagnosis Date  . Allergy   . Chronic headaches   . GERD (gastroesophageal reflux disease)   . Heart burn   . Hyperlipidemia   . Hypertension    Past Surgical History:  Procedure Laterality Date  . KNEE SURGERY    . SHOULDER SURGERY    .  TONSILLECTOMY     Social History   Socioeconomic History  . Marital status: Married    Spouse name: Not on file  . Number of children: Not on file  . Years of education: Not on file  . Highest education level: Not on file  Occupational History  . Not on file  Social Needs  . Financial resource strain: Not hard at all  . Food insecurity:    Worry: Never true    Inability: Never  true  . Transportation needs:    Medical: No    Non-medical: No  Tobacco Use  . Smoking status: Former Smoker    Types: Cigars  . Smokeless tobacco: Current User    Types: Chew  . Tobacco comment: wife states patient never smoked cigarettes, just chewing tobacco  Substance and Sexual Activity  . Alcohol use: No    Alcohol/week: 0.0 oz    Comment: beer occasioanlly   . Drug use: No  . Sexual activity: Yes    Birth control/protection: Inserts  Lifestyle  . Physical activity:    Days per week: 0 days    Minutes per session: 0 min  . Stress: Not at all  Relationships  . Social connections:    Talks on phone: More than three times a week    Gets together: More than three times a week    Attends religious service: Never    Active member of club or organization: No    Attends meetings of clubs or organizations: Never    Relationship status: Married  . Intimate partner violence:    Fear of current or ex partner: No    Emotionally abused: No    Physically abused: No    Forced sexual activity: No  Other Topics Concern  . Not on file  Social History Narrative  . Not on file   Family History  Problem Relation Age of Onset  . Cancer Father        lung  . Heart disease Sister   . Heart disease Brother   . Prostate cancer Neg Hx    Current Outpatient Medications on File Prior to Visit  Medication Sig  . allopurinol (ZYLOPRIM) 100 MG tablet Take 1 tablet (100 mg total) by mouth at bedtime.  Marland Kitchen amLODipine (NORVASC) 2.5 MG tablet Take 1 tablet (2.5 mg total) by mouth at bedtime.  . colchicine 0.6 MG tablet Take 1 tablet (0.6 mg total) by mouth as needed (gout flare).  . Insulin Glargine (LANTUS SOLOSTAR) 100 UNIT/ML Solostar Pen Inject 10 Units into the skin daily at 10 pm.  . Insulin Pen Needle (NOVOFINE PLUS) 32G X 4 MM MISC Use with Lantus insulin daily injection as instructed  . lisinopril (PRINIVIL,ZESTRIL) 40 MG tablet Take 1 tablet (40 mg total) by mouth daily.  .  metFORMIN (GLUCOPHAGE) 1000 MG tablet Take 1 tablet (1,000 mg total) by mouth 2 (two) times daily with a meal.  . omeprazole (PRILOSEC) 20 MG capsule Take 1 capsule (20 mg total) by mouth daily.   No current facility-administered medications on file prior to visit.     Review of Systems  Constitutional: Negative for activity change, appetite change, chills, diaphoresis, fatigue and fever.  HENT: Negative for congestion and hearing loss.   Eyes: Negative for visual disturbance.  Respiratory: Negative for apnea, cough, choking, chest tightness, shortness of breath and wheezing.   Cardiovascular: Negative for chest pain, palpitations and leg swelling.  Gastrointestinal: Positive for diarrhea (episodes only, due to medicine)  and nausea (secondary to medicine, improved). Negative for abdominal pain, anal bleeding, blood in stool, constipation and vomiting.  Endocrine: Negative for cold intolerance.  Genitourinary: Negative for decreased urine volume, difficulty urinating, dysuria, frequency, hematuria and urgency.  Musculoskeletal: Negative for arthralgias, back pain and neck pain.  Skin: Negative for rash.  Allergic/Immunologic: Negative for environmental allergies.  Neurological: Negative for dizziness, weakness, light-headedness, numbness and headaches.       Tingling and symptoms in lower legs see HPI  Hematological: Negative for adenopathy.  Psychiatric/Behavioral: Negative for behavioral problems, dysphoric mood and sleep disturbance. The patient is not nervous/anxious.    Per HPI unless specifically indicated above     Objective:    BP 120/62 (BP Location: Right Arm, Patient Position: Sitting, Cuff Size: Normal)   Pulse 79   Resp 16   Ht 5\' 11"  (1.803 m)   Wt 236 lb 9.6 oz (107.3 kg)   BMI 33.00 kg/m   Wt Readings from Last 3 Encounters:  10/03/17 236 lb 9.6 oz (107.3 kg)  08/29/17 230 lb 3.2 oz (104.4 kg)  06/15/17 239 lb (108.4 kg)    Physical Exam  Constitutional: He is  oriented to person, place, and time. He appears well-developed and well-nourished. No distress.  Well-appearing, comfortable, cooperative, obese  HENT:  Head: Normocephalic and atraumatic.  Mouth/Throat: Oropharynx is clear and moist.  Frontal / maxillary sinuses non-tender. Nares patent without purulence or edema. Bilateral TMs clear without erythema, effusion or bulging. Oropharynx clear without erythema, exudates, edema or asymmetry.  Eyes: Pupils are equal, round, and reactive to light. Conjunctivae and EOM are normal. Right eye exhibits no discharge. Left eye exhibits no discharge.  Neck: Normal range of motion. Neck supple. No thyromegaly present.  Cardiovascular: Normal rate, regular rhythm, normal heart sounds and intact distal pulses.  No murmur heard. Pulmonary/Chest: Effort normal and breath sounds normal. No respiratory distress. He has no wheezes. He has no rales.  Abdominal: Soft. Bowel sounds are normal. He exhibits no distension and no mass. There is no tenderness.  Musculoskeletal: Normal range of motion. He exhibits no edema or tenderness.  Upper / Lower Extremities: - Normal muscle tone, strength bilateral upper extremities 5/5, lower extremities 5/5  Lymphadenopathy:    He has no cervical adenopathy.  Neurological: He is alert and oriented to person, place, and time.  Distal sensation intact to light touch all extremities  Skin: Skin is warm and dry. No rash noted. He is not diaphoretic. No erythema.  Psychiatric: He has a normal mood and affect. His behavior is normal.  Well groomed, good eye contact, normal speech and thoughts  Nursing note and vitals reviewed.    Diabetic Foot Exam - Simple   Simple Foot Form Diabetic Foot exam was performed with the following findings:  Yes 10/03/2017  3:21 PM  Visual Inspection No deformities, no ulcerations, no other skin breakdown bilaterally:  Yes Sensation Testing Intact to touch and monofilament testing bilaterally:   Yes Pulse Check Posterior Tibialis and Dorsalis pulse intact bilaterally:  Yes Comments    Recent Labs    03/16/17 0833 06/15/17 0949 08/29/17 0947  HGBA1C 6.7* 8.3* 9.7*    Results for orders placed or performed in visit on 08/29/17  Lipid panel  Result Value Ref Range   Cholesterol, Total 182 100 - 199 mg/dL   Triglycerides 161 0 - 149 mg/dL   HDL 40 >09 mg/dL   VLDL Cholesterol Cal 20 5 - 40 mg/dL   LDL Calculated 604 (  H) 0 - 99 mg/dL   Chol/HDL Ratio 4.6 0.0 - 5.0 ratio  CBC with Differential/Platelet  Result Value Ref Range   WBC 5.2 3.4 - 10.8 x10E3/uL   RBC 4.20 4.14 - 5.80 x10E6/uL   Hemoglobin 13.2 13.0 - 17.7 g/dL   Hematocrit 16.1 09.6 - 51.0 %   MCV 92 79 - 97 fL   MCH 31.4 26.6 - 33.0 pg   MCHC 34.2 31.5 - 35.7 g/dL   RDW 04.5 40.9 - 81.1 %   Platelets 282 150 - 450 x10E3/uL   Neutrophils 62 Not Estab. %   Lymphs 29 Not Estab. %   Monocytes 6 Not Estab. %   Eos 2 Not Estab. %   Basos 1 Not Estab. %   Neutrophils Absolute 3.3 1.4 - 7.0 x10E3/uL   Lymphocytes Absolute 1.5 0.7 - 3.1 x10E3/uL   Monocytes Absolute 0.3 0.1 - 0.9 x10E3/uL   EOS (ABSOLUTE) 0.1 0.0 - 0.4 x10E3/uL   Basophils Absolute 0.0 0.0 - 0.2 x10E3/uL   Immature Granulocytes 0 Not Estab. %   Immature Grans (Abs) 0.0 0.0 - 0.1 x10E3/uL  Hemoglobin A1c  Result Value Ref Range   Hgb A1c MFr Bld 9.7 (H) 4.8 - 5.6 %   Est. average glucose Bld gHb Est-mCnc 232 mg/dL  PSA  Result Value Ref Range   Prostate Specific Ag, Serum 4.5 (H) 0.0 - 4.0 ng/mL  Hepatitis C antibody  Result Value Ref Range   Hep C Virus Ab <0.1 0.0 - 0.9 s/co ratio      Assessment & Plan:   Problem List Items Addressed This Visit    CKD (chronic kidney disease), stage III (HCC)    Stable CKD-III Secondary to age, Diabetes, HTN, possibly NSAID use  Plan: 1. Improve hydration, limit NSAIDs orally 2. Monitor Cr trend 3. Control DM - switched to new GLP agent 4. Follow-up q 6-12 months Cr      Elevated PSA,  less than 10 ng/ml    Average risk patient - Last PSA 4.5, prior values unknown - Clinically asymptomatic  Plan: 1. Reviewed prostate cancer screening guidelines and risks including potential prostate biopsy if abnormal PSA, proceed with yearly PSA for now, and anticipate DRE as needed especially if abnormal PSA or new symptoms  Repeat PSA in 3 months - if indicated will pursue offer DRE and Urology referral      Relevant Orders   PSA   Hyperlipidemia associated with type 2 diabetes mellitus (HCC)    Uncontrolled cholesterol elevated LDL otherwise normal but improving lifestyle Last lipid panel 08/2017 Calculated ASCVD 10 yr risk score elevated as diabetic >25%  Plan: 1. Consider statin therapy - decline for now with changing DM medicine improving control - avoid new start med, and he declines d/t myalgias 2. Encourage improved lifestyle - low carb/cholesterol, reduce portion size, continue improving regular exercise      Relevant Medications   Exenatide ER (BYDUREON BCISE) 2 MG/0.85ML AUIJ   Insulin Glargine (LANTUS SOLOSTAR) 100 UNIT/ML Solostar Pen   Hypertension    Well-controlled HTN Complication with CKD-III    Plan:  1. Continue current BP regimen - Amlodipine 2.5mg  daily, Lisinopril 40mg  daily 2. Encourage improved lifestyle - low sodium diet, regular exercise improve as tolerated 3. Continue monitor BP outside office, bring readings to next visit, if persistently >140/90 or new symptoms notify office sooner      Lumbar radiculopathy    Clinically stable with some persistent chronic low back pain  and radiculopathy symptoms into bilateral upper legs Prior improved on ESI x 2 per Dr JordanMali ([facility] [place]) Advised he may return to them to consider repeat injection in future vs surgical options      Myalgia due to statin    Failed prior statin due to myalgias      Obesity (BMI 30.0-34.9)    Weight down and then gain again in past 1 month Still some poor diet  and lifestyle Encourage improve diet, seek treatment for back to improve activity Switch GLP agent may help with weight loss      Uncontrolled type 2 diabetes mellitus with diabetic nephropathy, with long-term current use of insulin (HCC) - Primary    Still Uncontrolled DM with hyperglycemia, A1c 9.7 from prior 6.7 > 8.3 Complications - CKD-III nephropathy, other neuropathy from spine but less likely DM neuropathy, other including hyperlipidemia, GERD, obesity - increases risk of future cardiovascular complications  Off Tradjenta, duplicate therapy/cost, Invokana Failed Trulicity 0.75 and 1.5mg  weekly d/t GI intolerance  Plan:  1. - DISCONTINUE Trulicity 0.75 - START new GLP - Bydureon BCise 2mg  weekly inj [facility] Trulicity 0.75 and 1.5 and only fill new rx sent today Bydureon BCise - Continue current therapy - metformin 1000mg  BID, Lantus 10u nightly may titrate dose up as tolerated based on fasting CBG if >150 persistently gradual increase 1-2 u per week 2. Encourage improved lifestyle - low carb, low sugar diet, reduce portion size, continue improving regular exercise 3. Check CBG, bring log to next visit for review 4. Continue ACEi 5. DM Foot exam done today 6. Follow-up 3 months [facility] A1c      Relevant Medications   Exenatide ER (BYDUREON BCISE) 2 MG/0.85ML AUIJ   Insulin Glargine (LANTUS SOLOSTAR) 100 UNIT/ML Solostar Pen   Other Relevant Orders   Hemoglobin A1c    Other Visit Diagnoses    Seasonal allergic rhinitis due to other allergic trigger       Not c/w with bacterial sinusitis, start oral anti histamine and switch off flonase to Atrovent PRN congestion. May call back if worsening criteria consider abx   Relevant Medications   ipratropium (ATROVENT) 0.06 % nasal spray      Meds ordered this encounter  Medications  . Exenatide ER (BYDUREON BCISE) 2 MG/0.85ML AUIJ    Sig: Inject 2 mg into the skin once a week.    Dispense:   4 pen    Refill:  3    Discontinued Trulicity 0.75 now switch to GoogleBydureon BCise due to medication side effect on Trulicity  . ipratropium (ATROVENT) 0.06 % nasal spray    Sig: Place 2 sprays into both nostrils 3 (three) times daily as needed for rhinitis.    Dispense:  15 mL    Refill:  3    Follow up plan: Return in about 3 months (around 01/03/2018) for Lab results - DM med adjust, PSA.   Future labs A1c, PSA were ordered for [facility] to be drawn in approximately 3 months  Saralyn PilarAlexander Nakiya Rallis, DO [facility] 10/03/2017, 4:22 PM

## 2017-10-03 NOTE — Assessment & Plan Note (Signed)
Average risk patient - Last PSA 4.5, prior values unknown - Clinically asymptomatic  Plan: 1. Reviewed prostate cancer screening guidelines and risks including potential prostate biopsy if abnormal PSA, proceed with yearly PSA for now, and anticipate DRE as needed especially if abnormal PSA or new symptoms  Repeat PSA in 3 months - if indicated will pursue offer DRE and Urology referral

## 2017-10-03 NOTE — Patient Instructions (Addendum)
Thank you for coming to the office today.  Start OTC Loratadine (Claritin) or Cetirizine (Zyrtec) 10mg  daily for allergies and congestion  Start Atrovent nasal spray decongestant 2 sprays in each nostril up to 3 times daily as needed for congestion  May remain OFF Flonase  STOP Trulicity 0.75mg  weekly START new Bydureon BCise 2mg  weekly injection - sent to [facility] - should have less side effects  Continue Lantus insulin 10 units nightly - goal for fasting sugar < 150 daily, if persistently >150 to 170 or higher then we may need to gradually increase the Lantus  I think your symptoms in the legs and thighs are most likely due to Spinal symptoms - may need a 3rd Epidural Spinal Injection - and consider other options.  Elevated PSA 4.5 - we will check this again in 3 months may need Urologist  DUE for FASTING BLOOD WORK (no food or drink after midnight before the lab appointment, only water or coffee without cream/sugar on the morning of)  SCHEDULE "Lab Only" visit in the morning at the clinic for lab draw in 3 MONTHS   - Make sure Lab Only appointment is at about 1 week before your next appointment, so that results will be available  For Lab Results, once available within 2-3 days of blood draw, you can can log in to MyChart online to view your results and a brief explanation. Also, we can discuss results at next follow-up visit.   Please schedule a Follow-up Appointment to: Return in about 3 months (around 01/03/2018) for Lab results - DM med adjust, PSA.  If you have any other questions or concerns, please feel free to call the office or send a message through MyChart. You may also schedule an earlier appointment if necessary.  Additionally, you may be receiving a survey about your experience at our office within a few days to 1 week by e-mail or mail. We value your feedback.  Saralyn PilarAlexander Millenia Waldvogel, DO [facility]

## 2017-10-03 NOTE — Assessment & Plan Note (Signed)
Weight down and then gain again in past 1 month Still some poor diet and lifestyle Encourage improve diet, seek treatment for back to improve activity Switch GLP agent may help with weight loss

## 2017-10-03 NOTE — Assessment & Plan Note (Signed)
Failed prior statin due to myalgias

## 2017-10-30 ENCOUNTER — Telehealth: Payer: Self-pay | Admitting: Family Medicine

## 2017-10-30 NOTE — Telephone Encounter (Signed)
Wife called requesting refill on  Lantus called into [facility] [address] Call back # is  [phone]

## 2017-10-30 NOTE — Telephone Encounter (Signed)
Left detail message he still has [facility] left [facility] [address]

## 2017-11-02 NOTE — Telephone Encounter (Signed)
Reviewed chart, agree with verbal for Lantus 10u nightly given, same as last rx on file 7/9, unsure why never received.  Saralyn PilarAlexander Antawn Sison, DO [facility] 11/02/2017, 12:00 PM

## 2018-01-01 ENCOUNTER — Other Ambulatory Visit: Payer: Self-pay

## 2018-01-01 ENCOUNTER — Telehealth: Payer: Self-pay | Admitting: Family Medicine

## 2018-01-01 DIAGNOSIS — Z794 Long term (current) use of insulin: Principal | ICD-10-CM

## 2018-01-01 DIAGNOSIS — E1165 Type 2 diabetes mellitus with hyperglycemia: Secondary | ICD-10-CM

## 2018-01-01 DIAGNOSIS — E1121 Type 2 diabetes mellitus with diabetic nephropathy: Secondary | ICD-10-CM

## 2018-01-01 DIAGNOSIS — IMO0002 Reserved for concepts with insufficient information to code with codable children: Secondary | ICD-10-CM

## 2018-01-01 DIAGNOSIS — R972 Elevated prostate specific antigen [PSA]: Secondary | ICD-10-CM

## 2018-01-01 NOTE — Telephone Encounter (Signed)
Patient notified

## 2018-01-01 NOTE — Telephone Encounter (Signed)
Pt is supposed to do labs on Friday but his back is bothering him and he wants to have one of the injections.  He said the injection messes with his sugar and asked if he could do labs before the injection [phone]

## 2018-01-01 NOTE — Telephone Encounter (Signed)
Patient is actually only scheduled for a follow-up on 10/21. He does not need a physical. He has Medicare and also we did yearly labs in 08/2017.  Please notify patient:  I have discontinued [facility] labs A1c / PSA. And placed new orders for [facility] A1c and PSA. He may get them drawn tomorrow at [facility], he should contact our office to request that we RELEASE lab orders before he goes to [facility] tomorrow. And if need we can fax orders tomorrow.  He can keep apt as scheduled 01/15/18.  Saralyn Pilar, DO [facility] 01/01/2018, 2:35 PM

## 2018-01-01 NOTE — Telephone Encounter (Signed)
Patient wants to get lab work done by tomorrow don't get bill from lab since wife work for [employer]. He is getting physical done on 01/15/2018 and lab is lab [address]

## 2018-01-02 DIAGNOSIS — E1165 Type 2 diabetes mellitus with hyperglycemia: Secondary | ICD-10-CM | POA: Diagnosis not present

## 2018-01-02 DIAGNOSIS — Z794 Long term (current) use of insulin: Secondary | ICD-10-CM | POA: Diagnosis not present

## 2018-01-02 DIAGNOSIS — E1121 Type 2 diabetes mellitus with diabetic nephropathy: Secondary | ICD-10-CM | POA: Diagnosis not present

## 2018-01-02 DIAGNOSIS — R972 Elevated prostate specific antigen [PSA]: Secondary | ICD-10-CM | POA: Diagnosis not present

## 2018-01-03 DIAGNOSIS — M5416 Radiculopathy, lumbar region: Secondary | ICD-10-CM | POA: Diagnosis not present

## 2018-01-03 LAB — HEMOGLOBIN A1C
Est. average glucose Bld gHb Est-mCnc: 212 mg/dL
Hgb A1c MFr Bld: 9 % — ABNORMAL HIGH (ref 4.8–5.6)

## 2018-01-03 LAB — PSA: Prostate Specific Ag, Serum: 3.9 ng/mL (ref 0.0–4.0)

## 2018-01-15 ENCOUNTER — Encounter: Payer: Self-pay | Admitting: Family Medicine

## 2018-01-15 ENCOUNTER — Ambulatory Visit (INDEPENDENT_AMBULATORY_CARE_PROVIDER_SITE_OTHER): Payer: Medicare Other | Admitting: Family Medicine

## 2018-01-15 VITALS — BP 138/68 | HR 87 | Temp 99.1°F | Resp 16 | Ht 71.0 in | Wt 233.6 lb

## 2018-01-15 DIAGNOSIS — E1121 Type 2 diabetes mellitus with diabetic nephropathy: Secondary | ICD-10-CM

## 2018-01-15 DIAGNOSIS — Z794 Long term (current) use of insulin: Secondary | ICD-10-CM | POA: Diagnosis not present

## 2018-01-15 DIAGNOSIS — E1165 Type 2 diabetes mellitus with hyperglycemia: Secondary | ICD-10-CM | POA: Diagnosis not present

## 2018-01-15 DIAGNOSIS — R972 Elevated prostate specific antigen [PSA]: Secondary | ICD-10-CM | POA: Diagnosis not present

## 2018-01-15 DIAGNOSIS — IMO0002 Reserved for concepts with insufficient information to code with codable children: Secondary | ICD-10-CM

## 2018-01-15 DIAGNOSIS — J3089 Other allergic rhinitis: Secondary | ICD-10-CM

## 2018-01-15 MED ORDER — EXENATIDE ER 2 MG ~~LOC~~ PEN: 2.0000 mg | PEN_INJECTOR | SUBCUTANEOUS | 0 refills | Status: DC

## 2018-01-15 MED ORDER — FLUTICASONE PROPIONATE 50 MCG/ACT NA SUSP: 2.0000 | Freq: Every day | NASAL | 3 refills | Status: DC

## 2018-01-15 NOTE — Progress Notes (Signed)
Subjective:    Patient ID: [name], male    DOB: [birth year], 67 y.o.   MRN: [id number]  [name] is a 67 y.o. male presenting on 01/15/2018 for Elevated PSA (follow up lab result)   HPI   CHRONIC DM, Type 2 with nephropathy / CKD-III Last visit 09/2017, for Diabetes, previous visit he had elevated A1c still in past, up to 9.7, now has been switched from Trulicity 0.75 over to Bydureon after GI intolerance on Trulicity. He has done better on Bydureon, has had wt loss 10 lbs in past and A1c now improved on recent lab to 9.0, he has had recent CBG readings 130s on avg recently. Meds: - Bydureon Bcise 2mg  weekly inj - limited due to cost w/ Medicare Donut hole within next 1-2 months, requesting assistance -Lantus insulin14u nightly -Metformin 1000mg  BID - Toleratingmeds well, has some mild loose stools at time Reports good compliance Currently on ACEi Lifestyle: - Diet(Significantly improve diet recently, no sweets, and improving - in past 2 weeks) - Exercise (limitedstays active but ultimately his back does limit his activity) Denies hypoglycemia  PSA Prostate Cancer Screening - Improved now 3.9 from last time 4.5. He is asymptomatic, no new concerns or urinary symptoms. Has not seen Urologist.  Health Maintenance: Due for Flu Shot, declines today despite counseling on benefits   Depression screen [facility] 2/9 01/15/2018 08/29/2017 03/13/2017  Decreased Interest 0 0 0  Down, Depressed, Hopeless 0 0 0  PHQ - 2 Score 0 0 0    Social History   Tobacco Use  . Smoking status: Former Smoker    Types: Cigars  . Smokeless tobacco: Current User    Types: Chew  . Tobacco comment: wife states patient never smoked cigarettes, just chewing tobacco  Substance Use Topics  . Alcohol use: No    Alcohol/week: 0.0 standard drinks    Comment: beer occasioanlly   . Drug use: No    Review of Systems Per HPI unless specifically indicated above     Objective:    BP  138/68   Pulse 87   Temp 99.1 F (37.3 C) (Oral)   Resp 16   Ht 5\' 11"  (1.803 m)   Wt 233 lb 9.6 oz (106 kg)   BMI 32.58 kg/m   Wt Readings from Last 3 Encounters:  01/15/18 233 lb 9.6 oz (106 kg)  10/03/17 236 lb 9.6 oz (107.3 kg)  08/29/17 230 lb 3.2 oz (104.4 kg)    Physical Exam  Constitutional: He is oriented to person, place, and time. He appears well-developed and well-nourished. No distress.  Well-appearing, comfortable, cooperative, obese  HENT:  Head: Normocephalic and atraumatic.  Mouth/Throat: Oropharynx is clear and moist.  Eyes: Conjunctivae are normal. Right eye exhibits no discharge. Left eye exhibits no discharge.  Neck: Normal range of motion. Neck supple. No thyromegaly present.  Cardiovascular: Normal rate, regular rhythm, normal heart sounds and intact distal pulses.  No murmur heard. Pulmonary/Chest: Effort normal and breath sounds normal. No respiratory distress. He has no wheezes. He has no rales.  Musculoskeletal: Normal range of motion. He exhibits no edema.  Lymphadenopathy:    He has no cervical adenopathy.  Neurological: He is alert and oriented to person, place, and time.  Skin: Skin is warm and dry. No rash noted. He is not diaphoretic. No erythema.  Psychiatric: He has a normal mood and affect. His behavior is normal.  Well groomed, good eye contact, normal speech and thoughts  Nursing  note and vitals reviewed.    Recent Labs    06/15/17 0949 08/29/17 0947 01/02/18 1046  HGBA1C 8.3* 9.7* 9.0*    Results for orders placed or performed in visit on 01/01/18  Hemoglobin A1c  Result Value Ref Range   Hgb A1c MFr Bld 9.0 (H) 4.8 - 5.6 %   Est. 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 CONTINUE Bydureon BCise 2mg  weekly inj as he is tolerating it better - due to medicare donut hole and financial limitation - gave patient 4 bydureon bcise pen samples today for 1 month supply - he may continue rx as it is covered but will be unable to afford soon until 03/28/18 - Continue current therapy - metformin 1000mg  BID, Lantus 14u nightly may titrate dose up as tolerated based on fasting CBG if >150 persistently gradual increase 1-2 u per week 2. Encourage improved lifestyle - low carb, low sugar diet, reduce portion size, continue improving regular exercise 3. Check CBG, bring log to next visit for review 4. Continue ACEi 5. Follow-up 4 months [facility] A1c       Other Visit Diagnoses    Seasonal allergic rhinitis due to other allergic trigger       Relevant Medications   fluticasone (FLONASE) 50 MCG/ACT nasal spray      Meds ordered this encounter  Medications  . fluticasone (FLONASE) 50 MCG/ACT nasal spray    Sig: Place 2 sprays into both nostrils daily. Use for 4-6 weeks then stop and use seasonally or as needed.    Dispense:  16 g    Refill:  3  . DISCONTD: Exenatide ER (BYDUREON) 2 MG PEN    Sig: Inject 2 mg into the skin once a week.    Dispense:  4 each    Refill:  0    Follow up plan: Return in about 4 months (around 05/18/2018) for Diabetes A1c.  Saralyn Pilar, DO [facility] 01/16/2018, 2:00 PM

## 2018-01-15 NOTE — Patient Instructions (Addendum)
Thank you for coming to the office today.  A1c 9.0, improving - samples of Bydureon today for [facility].  Continue Bydureon, Metformin, Lantus 14 units daily  PSA prostate lab improved to 3.9, we will check this every 6 months for now, if elevated again we can refer to Urologist in future  Colon Cancer Screening: - For all adults age 66+ routine colon cancer screening is highly recommended.     - Recent guidelines from [place] recommend starting age of 60 - Early detection of colon cancer is important, because often there are no warning signs or symptoms, also if found early usually it can be cured. Late stage is hard to treat.  - If you are not interested in Colonoscopy screening (if done and normal you could be cleared for 5 to 10 years until next due), then Cologuard is an excellent alternative for screening test for Colon Cancer. It is highly sensitive for detecting DNA of colon cancer from even the earliest stages. Also, there is NO bowel prep required. - If Cologuard is NEGATIVE, then it is good for 3 years before next due - If Cologuard is POSITIVE, then it is strongly advised to get a Colonoscopy, which allows the GI doctor to locate the source of the cancer or polyp (even very early stage) and treat it by removing it. ------------------------- If you would like to proceed with Cologuard (stool DNA test) - FIRST, call your insurance company and tell them you want to check cost of Cologuard tell them CPT Code [phone] (it may be completely covered and you could get for no cost, OR max cost without any coverage is about $600). Also, keep in mind if you do NOT open the kit, and decide not to do the test, you will NOT be charged, you should contact the company if you decide not to do the test. - If you want to proceed, you can notify us (phone message, Ama, or at next visit) and we will order it for you. The test kit will be delivered to you house within  about 1 week. Follow instructions to collect sample, you may call the company for any help or questions, 24/7 telephone support at [phone].   Please schedule a Follow-up Appointment to: Return in about 4 months (around 05/18/2018) for Diabetes A1c.  If you have any other questions or concerns, please feel free to call the office or send a message through [place]. You may also schedule an earlier appointment if necessary.  Additionally, you may be receiving a survey about your experience at our office within a few days to 1 week by e-mail or mail. We value your feedback.  Nobie Putnam, DO [place]

## 2018-01-16 ENCOUNTER — Other Ambulatory Visit: Payer: Self-pay | Admitting: Family Medicine

## 2018-01-16 DIAGNOSIS — E1121 Type 2 diabetes mellitus with diabetic nephropathy: Secondary | ICD-10-CM

## 2018-01-16 DIAGNOSIS — IMO0002 Reserved for concepts with insufficient information to code with codable children: Secondary | ICD-10-CM

## 2018-01-16 DIAGNOSIS — E1165 Type 2 diabetes mellitus with hyperglycemia: Secondary | ICD-10-CM

## 2018-01-16 DIAGNOSIS — Z794 Long term (current) use of insulin: Principal | ICD-10-CM

## 2018-01-16 NOTE — Assessment & Plan Note (Signed)
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 CONTINUE Bydureon BCise 2mg  weekly inj as he is tolerating it better - due to medicare donut hole and financial limitation - gave patient 4 bydureon bcise pen samples today for 1 month supply - he may continue rx as it is covered but will be unable to afford soon until 03/28/18 - Continue current therapy - metformin 1000mg  BID, Lantus 14u nightly may titrate dose up as tolerated based on fasting CBG if >150 persistently gradual increase 1-2 u per week 2. Encourage improved lifestyle - low carb, low sugar diet, reduce portion size, continue improving regular exercise 3. Check CBG, bring log to next visit for review 4. Continue ACEi 5. Follow-up 4 months [facility] A1c

## 2018-01-16 NOTE — Assessment & Plan Note (Signed)
Improved PSA down to 3.9 Asymptomatic WIll follow trend - next PSA with annual labs approx 09/2018

## 2018-01-17 DIAGNOSIS — M5416 Radiculopathy, lumbar region: Secondary | ICD-10-CM | POA: Diagnosis not present

## 2018-01-22 DIAGNOSIS — M5416 Radiculopathy, lumbar region: Secondary | ICD-10-CM | POA: Diagnosis not present

## 2018-01-30 ENCOUNTER — Telehealth: Payer: Self-pay | Admitting: Family Medicine

## 2018-01-30 DIAGNOSIS — IMO0002 Reserved for concepts with insufficient information to code with codable children: Secondary | ICD-10-CM

## 2018-01-30 DIAGNOSIS — E1121 Type 2 diabetes mellitus with diabetic nephropathy: Secondary | ICD-10-CM

## 2018-01-30 DIAGNOSIS — E1165 Type 2 diabetes mellitus with hyperglycemia: Secondary | ICD-10-CM

## 2018-01-30 DIAGNOSIS — Z794 Long term (current) use of insulin: Principal | ICD-10-CM

## 2018-01-30 NOTE — Telephone Encounter (Signed)
I believe his diarrhea is due to medication side effect from Metformin.  The only real way to stop it or improve diarrhea would be to reduce or stop the medication. However he still needs it for his blood sugar.  One option is he can STOP current Metformin 1000mg  and can switch to Metformin XR dosing - which has less side effect or diarrhea. He may take Metformin XR 750mg  tablet and take TWO tablets at once a day - OR one tablet twice a day - which ever helps reduce his symptoms.  He should always take Metformin with food.  If he wants to change to this other Extended Release XR form - let me know and I can switch his med.  Otherwise. He can try OTC Imodium to slow down diarrhea only once in a while - maybe once or twice a week as needed. Follow package instructions.  Saralyn Pilar, DO [facility] 01/30/2018, 5:08 PM

## 2018-01-30 NOTE — Telephone Encounter (Signed)
Pt is still having diarrhea and asked what he could do besides pepto bismol [phone]

## 2018-01-31 MED ORDER — METFORMIN HCL ER 750 MG PO TB24: 1500.0000 mg | ORAL_TABLET | Freq: Every day | ORAL | 3 refills | Status: DC

## 2018-01-31 NOTE — Telephone Encounter (Signed)
Patient would like to get Rx for Metformin XR.

## 2018-01-31 NOTE — Telephone Encounter (Signed)
Changed rx metformin 1000 twice a day to metformin xr 750mg  daily and inc to x 2 tabs for 1500mg  daily  Saralyn Pilar, DO [facility] 01/31/2018, 12:59 PM

## 2018-01-31 NOTE — Addendum Note (Signed)
Addended by: [name] on: 01/31/2018 12:59 PM   Modules accepted: Orders

## 2018-02-02 MED ORDER — METFORMIN HCL ER 750 MG PO TB24: 1500.0000 mg | ORAL_TABLET | Freq: Every day | ORAL | 3 refills | Status: DC

## 2018-02-02 NOTE — Addendum Note (Signed)
Addended by: [name] on: 02/02/2018 11:29 AM   Modules accepted: Orders

## 2018-02-02 NOTE — Telephone Encounter (Addendum)
Patient called today 11/8 stating pharmacy did not have new med. He did not realize it was actually Metformin XR he thought it was a diff name. Now he still states pharmacy did not receive it.  I see the problem is that his pharmacy was listed at time of refill at [facility] - this is why did not receive it. Now he goes to [facility]. Resent rx  Saralyn Pilar, DO [facility] 02/02/2018, 11:26 AM

## 2018-03-16 ENCOUNTER — Other Ambulatory Visit: Payer: Self-pay

## 2018-03-16 DIAGNOSIS — J3089 Other allergic rhinitis: Secondary | ICD-10-CM

## 2018-03-16 DIAGNOSIS — IMO0002 Reserved for concepts with insufficient information to code with codable children: Secondary | ICD-10-CM

## 2018-03-16 DIAGNOSIS — I10 Essential (primary) hypertension: Secondary | ICD-10-CM

## 2018-03-16 DIAGNOSIS — Z794 Long term (current) use of insulin: Secondary | ICD-10-CM

## 2018-03-16 DIAGNOSIS — E1165 Type 2 diabetes mellitus with hyperglycemia: Secondary | ICD-10-CM

## 2018-03-16 DIAGNOSIS — E1121 Type 2 diabetes mellitus with diabetic nephropathy: Secondary | ICD-10-CM

## 2018-03-16 MED ORDER — EXENATIDE ER 2 MG/0.85ML ~~LOC~~ AUIJ: 2.0000 mg | AUTO-INJECTOR | SUBCUTANEOUS | 0 refills | Status: DC

## 2018-03-16 MED ORDER — INSULIN GLARGINE 100 UNIT/ML SOLOSTAR PEN: 10.0000 [IU] | PEN_INJECTOR | Freq: Every day | SUBCUTANEOUS | 3 refills | Status: DC

## 2018-03-16 MED ORDER — FLUTICASONE PROPIONATE 50 MCG/ACT NA SUSP: 2.0000 | Freq: Every day | NASAL | 3 refills | Status: DC

## 2018-03-16 MED ORDER — AMLODIPINE BESYLATE 2.5 MG PO TABS: 2.5000 mg | ORAL_TABLET | Freq: Every day | ORAL | 0 refills | Status: DC

## 2018-04-04 DIAGNOSIS — M5416 Radiculopathy, lumbar region: Secondary | ICD-10-CM | POA: Diagnosis not present

## 2018-04-13 DIAGNOSIS — M5416 Radiculopathy, lumbar region: Secondary | ICD-10-CM | POA: Diagnosis not present

## 2018-04-20 ENCOUNTER — Telehealth: Payer: Self-pay | Admitting: Family Medicine

## 2018-04-20 DIAGNOSIS — M4316 Spondylolisthesis, lumbar region: Secondary | ICD-10-CM | POA: Diagnosis not present

## 2018-04-20 DIAGNOSIS — E119 Type 2 diabetes mellitus without complications: Secondary | ICD-10-CM | POA: Diagnosis not present

## 2018-04-20 DIAGNOSIS — Z86718 Personal history of other venous thrombosis and embolism: Secondary | ICD-10-CM | POA: Diagnosis not present

## 2018-04-20 DIAGNOSIS — M48062 Spinal stenosis, lumbar region with neurogenic claudication: Secondary | ICD-10-CM | POA: Diagnosis not present

## 2018-04-20 DIAGNOSIS — Z86711 Personal history of pulmonary embolism: Secondary | ICD-10-CM | POA: Diagnosis not present

## 2018-04-20 NOTE — Telephone Encounter (Signed)
Did not leave a voice message doesn't seem to be confidential number patient has appointment Monday will notify.

## 2018-04-20 NOTE — Telephone Encounter (Signed)
Pt is having surgery with [facility].  He had labs and was told is A1C was 9.  He scheduled an appt with Dr. Kirtland Bouchard on Monday but asked what it was the last time he was here [phone]

## 2018-04-23 ENCOUNTER — Encounter: Payer: Self-pay | Admitting: Family Medicine

## 2018-04-23 ENCOUNTER — Ambulatory Visit (INDEPENDENT_AMBULATORY_CARE_PROVIDER_SITE_OTHER): Payer: PRIVATE HEALTH INSURANCE | Admitting: Family Medicine

## 2018-04-23 ENCOUNTER — Other Ambulatory Visit: Payer: Self-pay | Admitting: Family Medicine

## 2018-04-23 VITALS — BP 130/67 | HR 74 | Temp 98.6°F | Resp 16 | Ht 71.0 in | Wt 231.0 lb

## 2018-04-23 DIAGNOSIS — IMO0002 Reserved for concepts with insufficient information to code with codable children: Secondary | ICD-10-CM

## 2018-04-23 DIAGNOSIS — E1121 Type 2 diabetes mellitus with diabetic nephropathy: Secondary | ICD-10-CM | POA: Diagnosis not present

## 2018-04-23 DIAGNOSIS — N183 Chronic kidney disease, stage 3 unspecified: Secondary | ICD-10-CM

## 2018-04-23 DIAGNOSIS — Z1211 Encounter for screening for malignant neoplasm of colon: Secondary | ICD-10-CM | POA: Diagnosis not present

## 2018-04-23 DIAGNOSIS — E1165 Type 2 diabetes mellitus with hyperglycemia: Secondary | ICD-10-CM

## 2018-04-23 DIAGNOSIS — Z794 Long term (current) use of insulin: Principal | ICD-10-CM

## 2018-04-23 LAB — POCT GLYCOSYLATED HEMOGLOBIN (HGB A1C): Hemoglobin A1C: 11.7 % — AB (ref 4.0–5.6)

## 2018-04-23 MED ORDER — INSULIN GLARGINE 100 UNIT/ML SOLOSTAR PEN: 16.0000 [IU] | PEN_INJECTOR | Freq: Every day | SUBCUTANEOUS | 3 refills | Status: DC

## 2018-04-23 MED ORDER — ACCU-CHEK SMARTVIEW VI STRP: ORAL_STRIP | 5 refills | Status: DC

## 2018-04-23 MED ORDER — METFORMIN HCL ER 750 MG PO TB24: 1500.0000 mg | ORAL_TABLET | Freq: Every day | ORAL | 3 refills | Status: DC

## 2018-04-23 NOTE — Patient Instructions (Addendum)
Thank you for coming to the office today.  Recent Labs    08/29/17 0947 01/02/18 1046 04/23/18 0957  HGBA1C 9.7* 9.0* 11.7*   Very important to dramatically improve diet as we have discussed. May try Keto Diet. Your sugar avg recently in past 1 week is about 130 which is good.  Check blood sugar twice daily.  Refilled Medicines Metformin, Lantus, can increase lantus slowly if fasting sugar >150 on average.  A1c in 6 weeks from [facility]  Please schedule a Follow-up Appointment to: Return in about 6 weeks (around 06/04/2018) for Follow-up 3 months for DM A1c.  If you have any other questions or concerns, please feel free to call the office or send a message through MyChart. You may also schedule an earlier appointment if necessary.  Additionally, you may be receiving a survey about your experience at our office within a few days to 1 week by e-mail or mail. We value your feedback.  Saralyn PilarAlexander Herby Amick, DO [facility]

## 2018-04-23 NOTE — Assessment & Plan Note (Signed)
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 CONTINUE Bydureon BCise 2mg  weekly inj - Continue Metformin XR 750 BID now (recently resumed) - Continue Lantus higher dose now at 16u daily - may increase dose if fasting sugar >150 on avg 2. Encourage improved lifestyle - low carb, low sugar diet, reduce portion size, continue improving regular exercise 3. Check CBG, bring log to next visit for review 4. Continue ACEi - DM Eye Exam 04/2018 5. Follow-up 3 months for DM A1c - also printed order for [facility] A1c given to patient today, printed

## 2018-04-23 NOTE — Assessment & Plan Note (Signed)
Stable CKD-III Secondary to age, Diabetes with hyperglycemia, HTN, possibly NSAID use  Plan: 1. Improve hydration, limit NSAIDs orally 2. Monitor Cr trend 3. Control DM see A&P 4. Follow-up q 6-12 months Cr

## 2018-04-23 NOTE — Progress Notes (Signed)
Subjective:    Patient ID: [name], male    DOB: 1950-06-07, 68 y.o.   MRN: [id number]  [name] is a 68 y.o. male presenting on 04/23/2018 for Diabetes (btw ear pain right side sore to touch around it)   HPI   CHRONIC DM, Type 2with nephropathy / CKD-III Last visit 01/15/18, prior A1c elevated. He was adjusted on therapy with continued Bydureon, and samples given, metformin and Lantus dosage adjust given still >9 Today he reports now Goal A1c < 7%, for future back surgery, will need lumbar MRI, future fusion Glucose readings >250+. Glucose readings in past 1 week have had avg 130 Now in past 1 week he has dramatic improvement in diet, limited carb, low sugar He continues Bydureon BCise 2mg  weekly He has increased his insulin Lantus up to 16 units He has now doubled Metformin XR 750mg  daily back up to BID now Weight down 5 lbs in 5 months He is scheduled for DM Eye Exam at February 2020 Currently on ACEi Denies hypoglycemia, polyuria   Depression screen [facility] 2/9 04/23/2018 01/15/2018 08/29/2017  Decreased Interest 0 0 0  Down, Depressed, Hopeless 0 0 0  PHQ - 2 Score 0 0 0    Social History   Tobacco Use  . Smoking status: Former Smoker    Types: Cigars  . Smokeless tobacco: Current User    Types: Chew  . Tobacco comment: wife states patient never smoked cigarettes, just chewing tobacco  Substance Use Topics  . Alcohol use: No    Alcohol/week: 0.0 standard drinks    Comment: beer occasioanlly   . Drug use: No    Review of Systems Per HPI unless specifically indicated above     Objective:    BP 130/67   Pulse 74   Temp 98.6 F (37 C) (Oral)   Resp 16   Ht 5\' 11"  (1.803 m)   Wt 231 lb (104.8 kg)   BMI 32.22 kg/m   Wt Readings from Last 3 Encounters:  04/23/18 231 lb (104.8 kg)  01/15/18 233 lb 9.6 oz (106 kg)  10/03/17 236 lb 9.6 oz (107.3 kg)    Physical Exam Vitals signs and nursing note reviewed.  Constitutional:      General: He  is not in acute distress.    Appearance: He is well-developed. He is not diaphoretic.     Comments: Well-appearing, comfortable, cooperative, obese  HENT:     Head: Normocephalic and atraumatic.     Right Ear: Tympanic membrane, ear canal and external ear normal.     Left Ear: Tympanic membrane, ear canal and external ear normal.     Ears:     Comments: Mild effusion clear L>R behind TM Eyes:     General:        Right eye: No discharge.        Left eye: No discharge.     Conjunctiva/sclera: Conjunctivae normal.  Neck:     Musculoskeletal: Normal range of motion and neck supple.     Thyroid: No thyromegaly.  Cardiovascular:     Rate and Rhythm: Normal rate and regular rhythm.     Heart sounds: Normal heart sounds. No murmur.  Pulmonary:     Effort: Pulmonary effort is normal. No respiratory distress.     Breath sounds: Normal breath sounds. No wheezing or rales.  Musculoskeletal: Normal range of motion.  Lymphadenopathy:     Cervical: No cervical adenopathy.  Skin:    General:  Skin is warm and dry.     Findings: No erythema or rash.  Neurological:     Mental Status: He is alert and oriented to person, place, and time.  Psychiatric:        Behavior: Behavior normal.     Comments: Well groomed, good eye contact, normal speech and thoughts    Results for orders placed or performed in visit on 04/23/18  POCT HgB A1C  Result Value Ref Range   Hemoglobin A1C 11.7 (A) 4.0 - 5.6 %   Recent Labs    08/29/17 0947 01/02/18 1046 04/23/18 0957  HGBA1C 9.7* 9.0* 11.7*       Assessment & Plan:   Problem List Items Addressed This Visit    CKD (chronic kidney disease), stage III (HCC)    Stable CKD-III Secondary to age, Diabetes with hyperglycemia, HTN, possibly NSAID use  Plan: 1. Improve hydration, limit NSAIDs orally 2. Monitor Cr trend 3. Control DM see A&P 4. 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 CONTINUE Bydureon BCise 2mg  weekly inj - Continue Metformin XR 750 BID now (recently resumed) - Continue Lantus higher dose now at 16u daily - may increase dose if fasting sugar >150 on avg 2. Encourage improved lifestyle - low carb, low sugar diet, reduce portion size, continue improving regular exercise 3. Check CBG, bring log to next visit for review 4. Continue ACEi - DM Eye Exam 04/2018 5. Follow-up 3 months for DM A1c - also printed order for [facility] A1c given to patient today, printed      Relevant Medications   Insulin Glargine (LANTUS SOLOSTAR) 100 UNIT/ML Solostar Pen   metFORMIN (GLUCOPHAGE-XR) 750 MG 24 hr tablet   ACCU-CHEK SMARTVIEW test strip   Other Relevant Orders   POCT HgB A1C (Completed)    Other Visit Diagnoses    Screening for colon cancer       Relevant Orders   Cologuard      Counseling on cologuard details, benefits, and screening method - order placed  Meds ordered this encounter  Medications  . Insulin Glargine (LANTUS SOLOSTAR) 100 UNIT/ML Solostar Pen    Sig: Inject 16 Units into the skin daily at 10 pm.    Dispense:  45 mL    Refill:  3    Please fill 90 day supply (3 boxes, 5 pens each 3mL per pen)  . metFORMIN (GLUCOPHAGE-XR) 750 MG 24 hr tablet    Sig: Take 2 tablets (1,500 mg total) by mouth daily with breakfast. May start with only 1 tab, and increase as tolerated.    Dispense:  180 tablet    Refill:  3  . ACCU-CHEK SMARTVIEW test strip    Sig:  Check sugar 2 times daily    Dispense:  100 each    Refill:  5    Accu-chek Nano      Follow up plan: Return in about 6 weeks (around 06/04/2018) for Follow-up 3 months for DM A1c.  [facility] - printed order, given to patient. Then likely follow-up in 3 to 6 months  Saralyn PilarAlexander , DO [facility] 04/23/2018, 10:05 AM

## 2018-05-06 ENCOUNTER — Other Ambulatory Visit: Payer: Self-pay | Admitting: Family Medicine

## 2018-05-06 DIAGNOSIS — J3089 Other allergic rhinitis: Secondary | ICD-10-CM

## 2018-05-11 DIAGNOSIS — Z1212 Encounter for screening for malignant neoplasm of rectum: Secondary | ICD-10-CM | POA: Diagnosis not present

## 2018-05-11 DIAGNOSIS — Z1211 Encounter for screening for malignant neoplasm of colon: Secondary | ICD-10-CM | POA: Diagnosis not present

## 2018-05-15 LAB — COLOGUARD: Cologuard: NEGATIVE

## 2018-05-17 ENCOUNTER — Telehealth: Payer: Self-pay | Admitting: Family Medicine

## 2018-05-17 NOTE — Telephone Encounter (Addendum)
Please notify patient of Cologuard result:   It is NEGATIVE. This is a good result, meaning that it is very unlikely to have any abnormal colon polyps or colon cancer.   Next due for cologuard test in 3 years - 2023  Saralyn Pilar, DO [facility] 05/17/2018, 10:51 PM

## 2018-05-18 NOTE — Telephone Encounter (Signed)
Spouse informed.

## 2018-05-22 ENCOUNTER — Ambulatory Visit: Payer: Medicare Other | Admitting: Family Medicine

## 2018-05-25 DIAGNOSIS — E119 Type 2 diabetes mellitus without complications: Secondary | ICD-10-CM | POA: Diagnosis not present

## 2018-05-25 LAB — HM DIABETES EYE EXAM

## 2018-05-29 ENCOUNTER — Telehealth: Payer: Self-pay | Admitting: Nurse Practitioner

## 2018-05-29 ENCOUNTER — Encounter: Payer: Self-pay | Admitting: Family Medicine

## 2018-05-29 NOTE — Telephone Encounter (Signed)
Pt wants to go to [facility] [address] on Friday to do A1C.  His call back [phone]

## 2018-05-29 NOTE — Telephone Encounter (Signed)
Printed and up front ready for patient.

## 2018-06-01 DIAGNOSIS — E1165 Type 2 diabetes mellitus with hyperglycemia: Secondary | ICD-10-CM | POA: Diagnosis not present

## 2018-06-01 DIAGNOSIS — Z794 Long term (current) use of insulin: Secondary | ICD-10-CM | POA: Diagnosis not present

## 2018-06-01 DIAGNOSIS — E1121 Type 2 diabetes mellitus with diabetic nephropathy: Secondary | ICD-10-CM | POA: Diagnosis not present

## 2018-06-02 LAB — HEMOGLOBIN A1C
Est. average glucose Bld gHb Est-mCnc: 166 mg/dL
Hgb A1c MFr Bld: 7.4 % — ABNORMAL HIGH (ref 4.8–5.6)

## 2018-06-05 ENCOUNTER — Other Ambulatory Visit: Payer: Self-pay | Admitting: Family Medicine

## 2018-06-05 DIAGNOSIS — IMO0002 Reserved for concepts with insufficient information to code with codable children: Secondary | ICD-10-CM

## 2018-06-05 DIAGNOSIS — Z794 Long term (current) use of insulin: Principal | ICD-10-CM

## 2018-06-05 DIAGNOSIS — E1121 Type 2 diabetes mellitus with diabetic nephropathy: Secondary | ICD-10-CM

## 2018-06-05 DIAGNOSIS — E1165 Type 2 diabetes mellitus with hyperglycemia: Secondary | ICD-10-CM

## 2018-06-06 ENCOUNTER — Other Ambulatory Visit: Payer: Self-pay | Admitting: Family Medicine

## 2018-06-06 DIAGNOSIS — I10 Essential (primary) hypertension: Secondary | ICD-10-CM

## 2018-06-06 DIAGNOSIS — K219 Gastro-esophageal reflux disease without esophagitis: Secondary | ICD-10-CM

## 2018-06-06 DIAGNOSIS — M1000 Idiopathic gout, unspecified site: Secondary | ICD-10-CM

## 2018-06-06 NOTE — Telephone Encounter (Signed)
Pt called requesting refill on gout medication,  Prilosec, lisinopril called into  [facility]  [address] Pt call back # [phone]

## 2018-06-07 MED ORDER — ALLOPURINOL 100 MG PO TABS: 100.0000 mg | ORAL_TABLET | Freq: Every day | ORAL | 3 refills | Status: DC

## 2018-06-07 MED ORDER — LISINOPRIL 40 MG PO TABS: 40.0000 mg | ORAL_TABLET | Freq: Every day | ORAL | 3 refills | Status: DC

## 2018-06-07 MED ORDER — OMEPRAZOLE 20 MG PO CPDR: 20.0000 mg | DELAYED_RELEASE_CAPSULE | Freq: Every day | ORAL | 3 refills | Status: DC

## 2018-06-07 NOTE — Telephone Encounter (Signed)
Please review. Thanks!  

## 2018-06-19 DIAGNOSIS — E119 Type 2 diabetes mellitus without complications: Secondary | ICD-10-CM | POA: Diagnosis not present

## 2018-06-19 DIAGNOSIS — M4316 Spondylolisthesis, lumbar region: Secondary | ICD-10-CM | POA: Diagnosis not present

## 2018-06-19 DIAGNOSIS — Z01818 Encounter for other preprocedural examination: Secondary | ICD-10-CM | POA: Diagnosis not present

## 2018-06-19 DIAGNOSIS — Z86718 Personal history of other venous thrombosis and embolism: Secondary | ICD-10-CM | POA: Diagnosis not present

## 2018-06-19 DIAGNOSIS — M5416 Radiculopathy, lumbar region: Secondary | ICD-10-CM | POA: Diagnosis not present

## 2018-06-19 DIAGNOSIS — M48062 Spinal stenosis, lumbar region with neurogenic claudication: Secondary | ICD-10-CM | POA: Diagnosis not present

## 2018-06-19 DIAGNOSIS — Z86711 Personal history of pulmonary embolism: Secondary | ICD-10-CM | POA: Diagnosis not present

## 2018-06-21 IMAGING — CT CT ANGIO CHEST
2 of 6 series · 18 of 46 positions shown · IV contrast (APPLIED)
Comparison: 12/05/2016 CXR, chest CT 10/25/2011

CLINICAL DATA: Chest pain onset 2 weeks ago after steroid
injection. Dyspnea over the past 2 days while walking around.

EXAM:
CT ANGIOGRAPHY CHEST WITH CONTRAST
TECHNIQUE: Multidetector CT imaging of the chest was performed using the
standard protocol during bolus administration of intravenous
contrast. Multiplanar CT image reconstructions and MIPs were
obtained to evaluate the vascular anatomy.
CONTRAST:  60 cc Isovue 370 IV

[Series 6: thins · axial · 0.83mm/px · z∈[-296,-44]mm · 16 of 279 slices shown]
[im 13/279  lung]
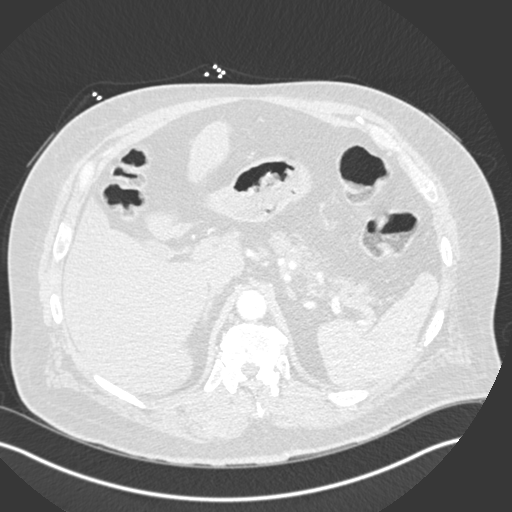
[im 37/279  soft-tissue]
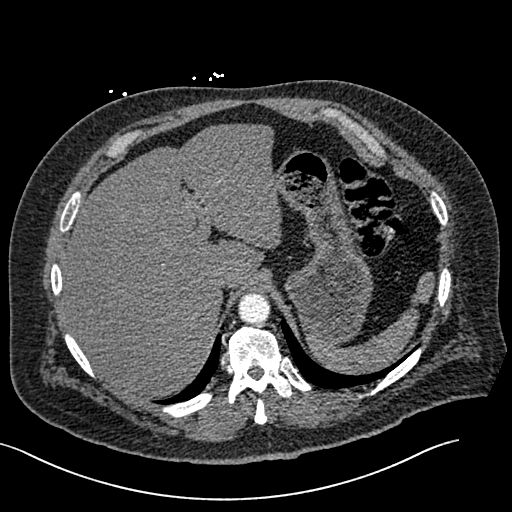
[im 49/279  lung]
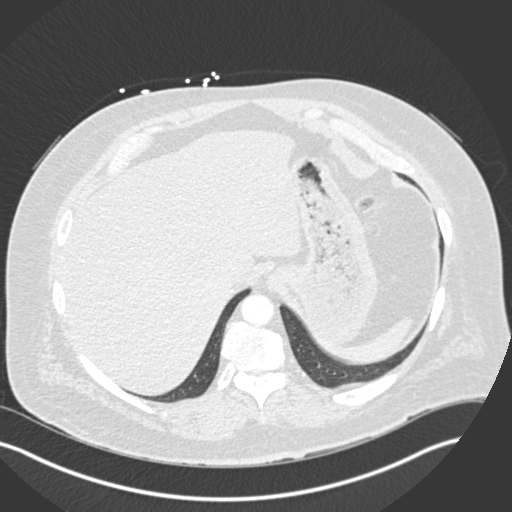
[im 61/279  soft-tissue]
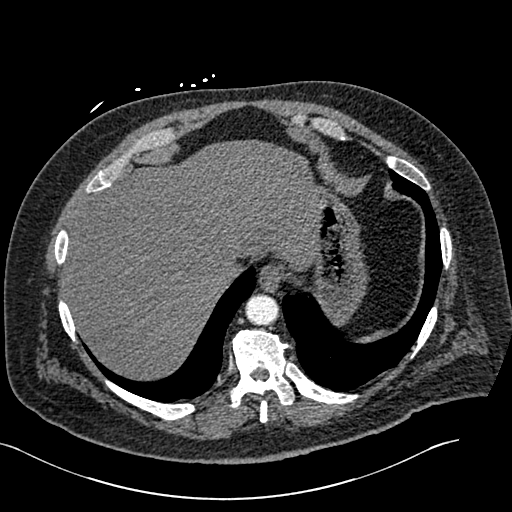
[im 85/279  lung]
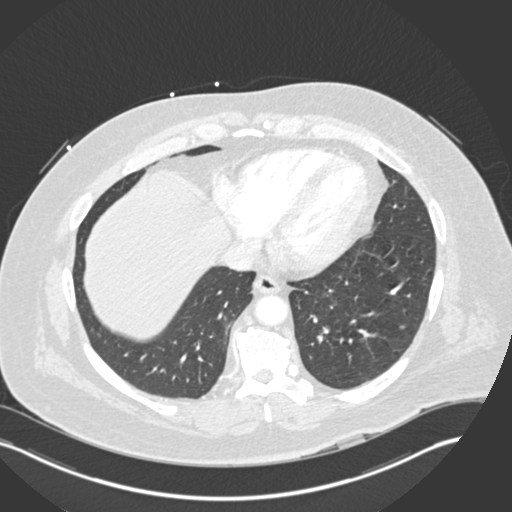
[im 97/279  soft-tissue]
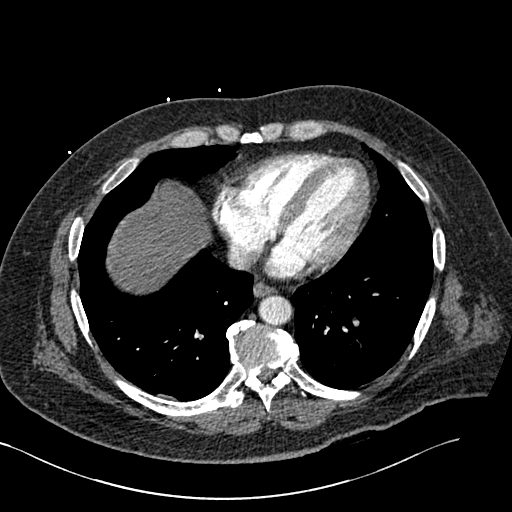
[im 109/279  lung]
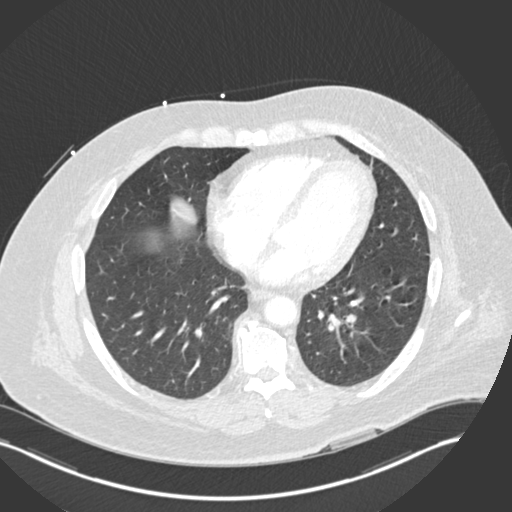
[im 133/279  soft-tissue]
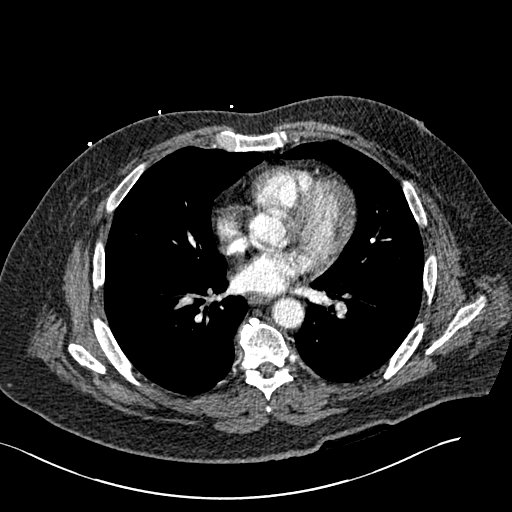
[im 146/279  lung]
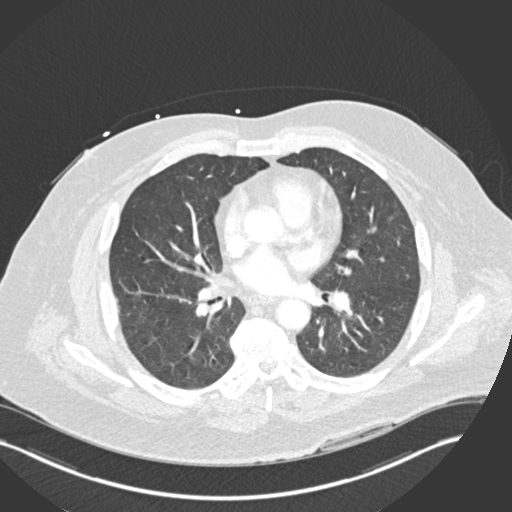
[im 170/279  soft-tissue]
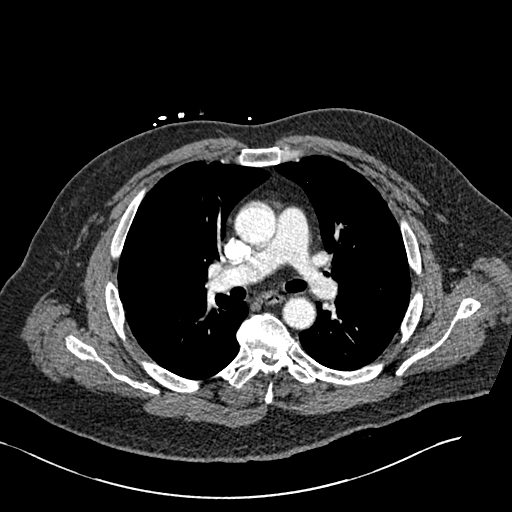
[im 182/279  lung]
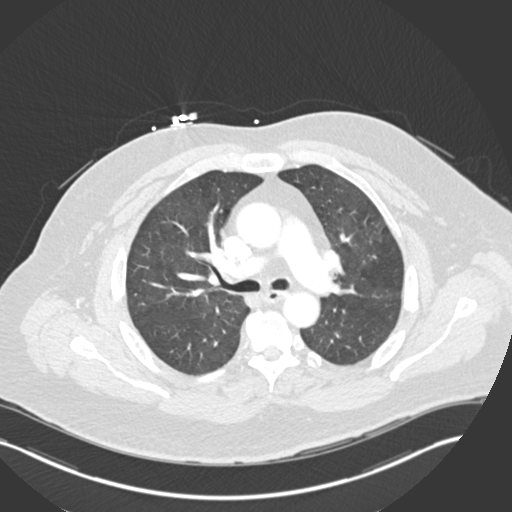
[im 194/279  soft-tissue]
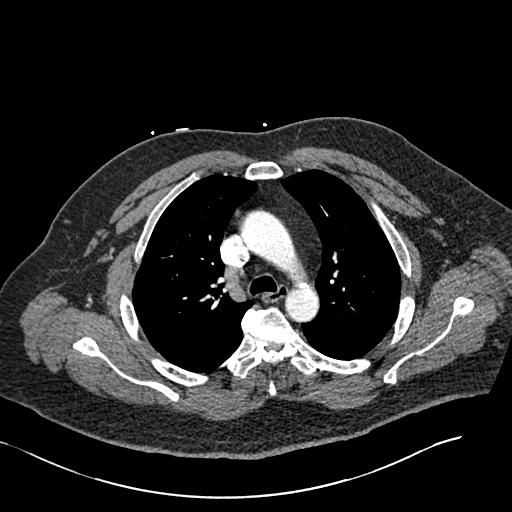
[im 218/279  lung]
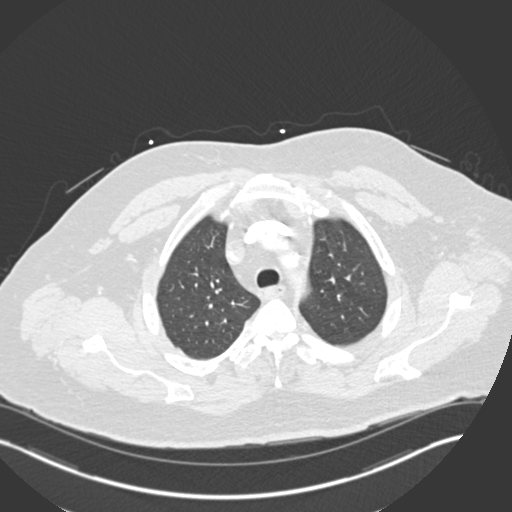
[im 230/279  soft-tissue]
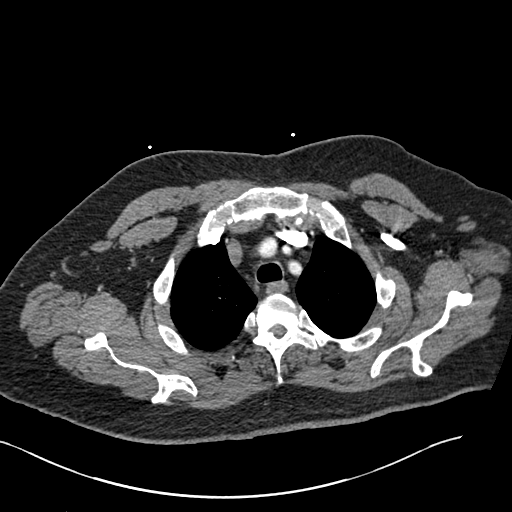
[im 242/279  lung]
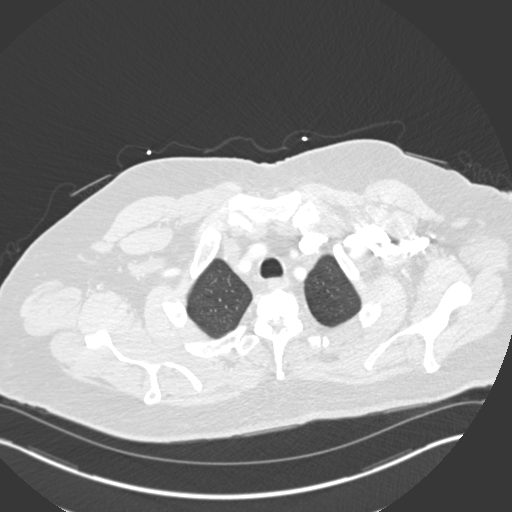
[im 266/279  soft-tissue]
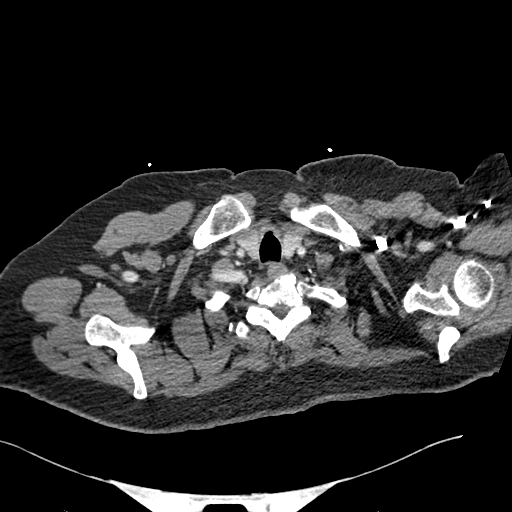

[Series 8: coronal mpr · coronal · 0.55mm/px · 2 of 97 slices shown]
[im 33/97  soft-tissue]
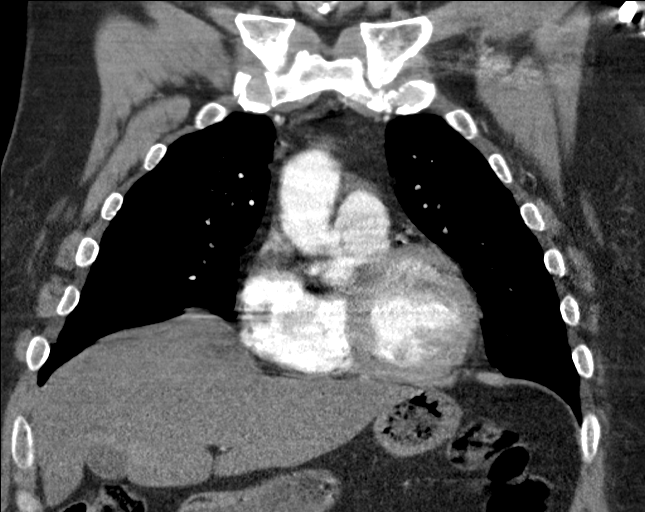
[im 65/97  soft-tissue]
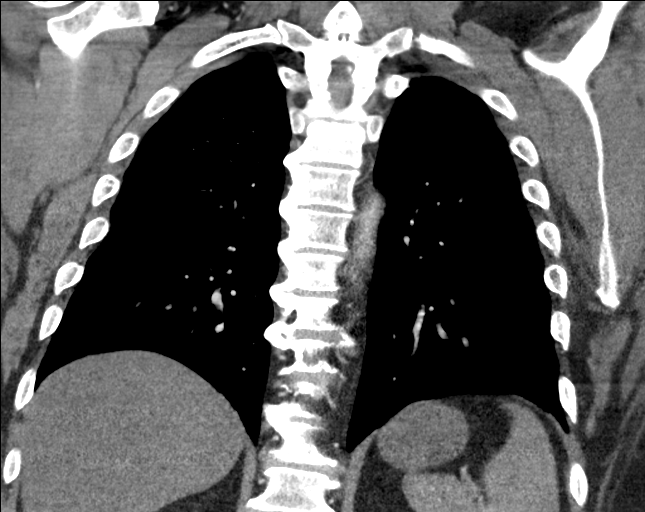

[18 of 46 positions shown; findings below may reference images not displayed]

FINDINGS: Cardiovascular: Adequate opacification of the pulmonary arterial
system. Acute pulmonary emboli noted to the lower lobes with RV/LV
ratio of 0.93. Heart is top-normal in size. No pericardial effusion.
Minimal aortic atherosclerosis. No aortic aneurysm or dissection.

Mediastinum/Nodes: No enlarged mediastinal, hilar, or axillary lymph
nodes. Thyroid gland, trachea, and esophagus demonstrate no
significant findings.

Lungs/Pleura: Minimal subpleural atelectasis noted in the left upper
and superior segment of right lower lobe. Stable 5 mm linear nodular
density in the left lower lobe series 7 image 66 with tiny lingular
subpleural 2 mm nodule. No effusion or pneumothorax. No dominant
mass or pulmonary consolidation.

Upper Abdomen: No acute abnormality.

Musculoskeletal: Midthoracic disc - osteophyte complexes are again
noted. No acute nor suspicious osseous lesions.

Review of the MIP images confirms the above findings.
IMPRESSION: Positive for acute PE with CTevidence of right heart strain (RV/LV
Ratio = 0.93) consistent with at least submassive (intermediate
risk) PE. The presence of right heart strain has been associated
with an increased risk of morbidity and mortality. Critical
Value/emergent results were called by telephone at the time of
interpretation on 12/05/2016 at [DATE] to Dr. LUNCZER KOHUTH ,
who verbally acknowledged these results.

Aortic Atherosclerosis ([id number]).

## 2018-08-28 ENCOUNTER — Encounter: Payer: Self-pay | Admitting: Family Medicine

## 2018-08-28 ENCOUNTER — Ambulatory Visit (INDEPENDENT_AMBULATORY_CARE_PROVIDER_SITE_OTHER): Payer: Medicare HMO | Admitting: Family Medicine

## 2018-08-28 ENCOUNTER — Other Ambulatory Visit: Payer: Self-pay | Admitting: Family Medicine

## 2018-08-28 ENCOUNTER — Other Ambulatory Visit: Payer: Self-pay

## 2018-08-28 DIAGNOSIS — E1169 Type 2 diabetes mellitus with other specified complication: Secondary | ICD-10-CM

## 2018-08-28 DIAGNOSIS — Z794 Long term (current) use of insulin: Secondary | ICD-10-CM

## 2018-08-28 DIAGNOSIS — I129 Hypertensive chronic kidney disease with stage 1 through stage 4 chronic kidney disease, or unspecified chronic kidney disease: Secondary | ICD-10-CM

## 2018-08-28 DIAGNOSIS — E1121 Type 2 diabetes mellitus with diabetic nephropathy: Secondary | ICD-10-CM | POA: Diagnosis not present

## 2018-08-28 DIAGNOSIS — R972 Elevated prostate specific antigen [PSA]: Secondary | ICD-10-CM

## 2018-08-28 DIAGNOSIS — N183 Chronic kidney disease, stage 3 unspecified: Secondary | ICD-10-CM

## 2018-08-28 DIAGNOSIS — M1000 Idiopathic gout, unspecified site: Secondary | ICD-10-CM

## 2018-08-28 DIAGNOSIS — E1165 Type 2 diabetes mellitus with hyperglycemia: Secondary | ICD-10-CM

## 2018-08-28 DIAGNOSIS — IMO0002 Reserved for concepts with insufficient information to code with codable children: Secondary | ICD-10-CM

## 2018-08-28 NOTE — Patient Instructions (Addendum)
Keep on track with diabetes, diet and medication.  DUE for FASTING BLOOD WORK (no food or drink after midnight before the lab appointment, only water or coffee without cream/sugar on the morning of)  [facility] 3 MONTHS   - Make sure Lab Only appointment is at about 1 week before your next appointment, so that results will be available  For Lab Results, once available within 2-3 days of blood draw, you can can log in to MyChart online to view your results and a brief explanation. Also, we can discuss results at next follow-up visit.   Please schedule a Follow-up Appointment to: Return in about 3 months (around 11/28/2018) for Yearly Medicare Checkup.  If you have any other questions or concerns, please feel free to call the office or send a message through MyChart. You may also schedule an earlier appointment if necessary.  Additionally, you may be receiving a survey about your experience at our office within a few days to 1 week by e-mail or mail. We value your feedback.  Saralyn Pilar, DO [facility], [place]

## 2018-08-28 NOTE — Progress Notes (Signed)
Virtual Visit via Telephone The purpose of this virtual visit is to provide medical care while limiting exposure to the novel coronavirus (COVID19) for both patient and office staff.  Consent was obtained for phone visit:  Yes.   Answered questions that patient had about telehealth interaction:  Yes.   I discussed the limitations, risks, security and privacy concerns of performing an evaluation and management service by telephone. I also discussed with the patient that there may be a patient responsible charge related to this service. The patient expressed understanding and agreed to proceed.  Patient Location: Home Provider Location: [facility])  ---------------------------------------------------------------------- Chief Complaint  Patient presents with  . Diabetes    S: Reviewed CMA documentation. I have called patient and gathered additional HPI as follows:  CHRONIC DM, Type 2with nephropathy / CKD-III Last visit 03/2018, continued back on diabetes regimen, he had prior A1c >11, then he improved lifestyle and able to reduce A1c down to 7.4 in 05/2018 but his shoulder surgery was delayed due to coronavirus CBGs: Avg 100-120 (lately up to 150-170 due to increased sweets, birthday [birth year]), Low >100, High < 200. Checks CBGs 1-2 daily Meds: - CONTINUE Bydureon BCise 2mg  weekly inj - Continue Metformin XR 750 BID (has added it back at night) - Continue Lantus back at 16u daily - may increase dose if fasting sugar >150 on avg Reports  good compliance. Tolerating well w/o side-effects Currently on ACEi Lifestyle: - Diet (improved DM Diet, except now some sweets increased birthday)  Denies hypoglycemia, polyuria, visual changes, numbness or tingling.  Denies any high risk travel to areas of current concern for COVID19. Denies any known or suspected exposure to person with or possibly with COVID19.  Denies any fevers, chills, sweats, body ache, cough, shortness of  breath, sinus pain or pressure, headache, abdominal pain, diarrhea  Past Medical History:  Diagnosis Date  . Allergy   . Chronic headaches   . GERD (gastroesophageal reflux disease)   . Heart burn   . Hyperlipidemia   . Hypertension    Social History   Tobacco Use  . Smoking status: Former Smoker    Types: Cigars  . Smokeless tobacco: Current User    Types: Chew  . Tobacco comment: wife states patient never smoked cigarettes, just chewing tobacco  Substance Use Topics  . Alcohol use: No    Alcohol/week: 0.0 standard drinks    Comment: beer occasioanlly   . Drug use: No    Current Outpatient Medications:  .  ACCU-CHEK SMARTVIEW test strip, Check sugar 2 times daily, Disp: 100 each, Rfl: 5 .  allopurinol (ZYLOPRIM) 100 MG tablet, Take 1 tablet (100 mg total) by mouth at bedtime., Disp: 90 tablet, Rfl: 3 .  amLODipine (NORVASC) 2.5 MG tablet, TAKE 1 TABLET BY MOUTH AT BEDTIME, Disp: 90 tablet, Rfl: 1 .  BYDUREON BCISE 2 MG/0.85ML AUIJ, INJECT 2MG  INTO THE SKIN ONCE A WEEK, Disp: 12 pen, Rfl: 1 .  colchicine 0.6 MG tablet, Take 1 tablet (0.6 mg total) by mouth as needed (gout flare)., Disp: 90 tablet, Rfl: 3 .  fluticasone (FLONASE) 50 MCG/ACT nasal spray, USE 2 SPRAY(S) IN EACH NOSTRIL ONCE DAILY USE FOR 4-6 WEEKS THEN STOP AND USE SEASONALLY OR AS NEEDED, Disp: 48 g, Rfl: 1 .  Insulin Glargine (LANTUS SOLOSTAR) 100 UNIT/ML Solostar Pen, Inject 16 Units into the skin daily at 10 pm., Disp: 45 mL, Rfl: 3 .  Insulin Pen Needle (NOVOFINE PLUS) 32G X 4 MM  MISC, Use with Lantus insulin daily injection as instructed, Disp: 90 each, Rfl: 3 .  lisinopril (PRINIVIL,ZESTRIL) 40 MG tablet, Take 1 tablet (40 mg total) by mouth daily., Disp: 90 tablet, Rfl: 3 .  metFORMIN (GLUCOPHAGE-XR) 750 MG 24 hr tablet, Take 2 tablets (1,500 mg total) by mouth daily with breakfast. May start with only 1 tab, and increase as tolerated. (Patient taking differently: Take 1,500 mg by mouth daily with breakfast.  May start with only 1 tab, and increase as tolerated.), Disp: 180 tablet, Rfl: 3 .  omeprazole (PRILOSEC) 20 MG capsule, Take 1 capsule (20 mg total) by mouth daily., Disp: 90 capsule, Rfl: 3  Depression screen [facility] 08/28/2018 04/23/2018 01/15/2018  Decreased Interest 0 0 0  Down, Depressed, Hopeless 0 0 0  PHQ - 2 Score 0 0 0    No flowsheet data found.  -------------------------------------------------------------------------- O: No physical exam performed due to remote telephone encounter.  Lab results reviewed.  Recent Labs    01/02/18 1046 04/23/18 0957 06/01/18 0921  HGBA1C 9.0* 11.7* 7.4*     Recent Results (from the past 2160 hour(s))  Hemoglobin A1c     Status: Abnormal   Collection Time: 06/01/18  9:21 AM  Result Value Ref Range   Hgb A1c MFr Bld 7.4 (H) 4.8 - 5.6 %    Comment:          Prediabetes: 5.7 - 6.4          Diabetes: >6.4          Glycemic control for adults with diabetes: <7.0    Est. 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 CONTINUE Bydureon BCise 2mg  weekly inj - Continue Metformin XR 750 BID - Continue Lantus higher dose now at 16-17u daily - may increase dose if fasting sugar >150 on avg 2. Encourage improved lifestyle - low carb, low sugar diet, reduce portion size, continue improving  regular exercise 3. Check CBG, bring log to next visit for review 4. Continue ACEi 5. Follow-up 3 months for yearly medicare checkup, [facility] labs prior         No orders of the defined types were placed in this encounter.   Follow-up: - Return in 3 months for Yearly Medicare Checkup - Future labs ordered for [facility]  Patient verbalizes understanding with the above medical recommendations including the limitation of remote medical advice.  Specific follow-up and call-back criteria were given for patient to follow-up or seek medical care more urgently if needed.   - Time spent in direct consultation with patient on phone: 11 minutes  Saralyn PilarAlexander Karamalegos, DO [facility] 08/28/2018, 10:00 AM

## 2018-08-28 NOTE — Assessment & Plan Note (Signed)
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 CONTINUE Bydureon BCise 2mg  weekly inj - Continue Metformin XR 750 BID - Continue Lantus higher dose now at 16-17u daily - may increase dose if fasting sugar >150 on avg 2. Encourage improved lifestyle - low carb, low sugar diet, reduce portion size, continue improving regular exercise 3. Check CBG, bring log to next visit for review 4. Continue ACEi 5. Follow-up 3 months for yearly medicare checkup, [facility] labs prior

## 2018-08-29 ENCOUNTER — Other Ambulatory Visit: Payer: Self-pay | Admitting: Family Medicine

## 2018-08-29 DIAGNOSIS — IMO0002 Reserved for concepts with insufficient information to code with codable children: Secondary | ICD-10-CM

## 2018-08-29 DIAGNOSIS — M5416 Radiculopathy, lumbar region: Secondary | ICD-10-CM | POA: Diagnosis not present

## 2018-08-29 DIAGNOSIS — E1165 Type 2 diabetes mellitus with hyperglycemia: Secondary | ICD-10-CM

## 2018-08-29 DIAGNOSIS — E1121 Type 2 diabetes mellitus with diabetic nephropathy: Secondary | ICD-10-CM

## 2018-08-29 DIAGNOSIS — E119 Type 2 diabetes mellitus without complications: Secondary | ICD-10-CM | POA: Diagnosis not present

## 2018-08-29 MED ORDER — EXENATIDE ER 2 MG/0.85ML ~~LOC~~ AUIJ: 2.0000 mg | AUTO-INJECTOR | SUBCUTANEOUS | 1 refills | Status: DC

## 2018-09-04 ENCOUNTER — Telehealth: Payer: Self-pay | Admitting: Family Medicine

## 2018-09-04 ENCOUNTER — Ambulatory Visit: Payer: Medicare Other

## 2018-09-04 NOTE — Telephone Encounter (Signed)
°~  Tiffany sick lvm to reschedule 09/04/18 knb~ °

## 2018-09-13 DIAGNOSIS — M4316 Spondylolisthesis, lumbar region: Secondary | ICD-10-CM | POA: Diagnosis not present

## 2018-09-13 DIAGNOSIS — M48062 Spinal stenosis, lumbar region with neurogenic claudication: Secondary | ICD-10-CM | POA: Diagnosis not present

## 2018-11-06 ENCOUNTER — Ambulatory Visit (INDEPENDENT_AMBULATORY_CARE_PROVIDER_SITE_OTHER): Payer: Medicare HMO

## 2018-11-06 DIAGNOSIS — Z0000 Encounter for general adult medical examination without abnormal findings: Secondary | ICD-10-CM

## 2018-11-06 NOTE — Progress Notes (Signed)
Subjective:   [name] is a 68 y.o. male who presents for Medicare Annual/Subsequent preventive examination.  This visit is being conducted via phone call  - after an attmept to do on video chat - due to the COVID-19 pandemic. This patient has given me verbal consent via phone to conduct this visit, patient states they are participating from their home address. Some vital signs may be absent or patient reported.   Patient identification: identified by name, DOB, and current address.    Review of Systems:   Cardiac Risk Factors include: hypertension;diabetes mellitus;dyslipidemia;advanced age (>77men, >39 women)     Objective:    Vitals: There were no vitals taken for this visit.  There is no height or weight on file to calculate BMI.  Advanced Directives 11/06/2018 08/29/2017 12/06/2016 12/06/2016 01/06/2016 12/07/2015  Does Patient Have a Medical Advance Directive? No No No - No No  Would patient like information on creating a medical advance directive? - No - Patient declined No - Patient declined (No Data) Yes - Scientist, clinical (histocompatibility and immunogenetics) given Yes - Scientist, clinical (histocompatibility and immunogenetics) given    Tobacco Social History   Tobacco Use  Smoking Status Former Smoker  . Types: Cigars  . Quit date: 2000  . Years since quitting: 20.6  Smokeless Tobacco Current User  . Types: Chew  Tobacco Comment   wife states patient never smoked cigarettes, just chewing tobacco     Ready to quit: Not Answered Counseling given: Not Answered Comment: wife states patient never smoked cigarettes, just chewing tobacco   Clinical Intake:  Pre-visit preparation completed: Yes  Pain : No/denies pain     Nutritional Risks: None Diabetes: Yes CBG done?: No Did pt. bring in CBG monitor from home?: No  How often do you need to have someone help you when you read instructions, pamphlets, or other written materials from your doctor or pharmacy?: 1 - Never  Interpreter Needed?: No  Information entered by  :: Daniel Caravello,LPN   Nutrition Risk Assessment:  Has the patient had any N/V/D within the last 2 months?  No  Does the patient have any non-healing wounds?  No  Has the patient had any unintentional weight loss or weight gain?  No   Diabetes:  Is the patient diabetic?  Yes  If diabetic, was a CBG obtained today? Yes, checked at home - 195 right after eating  Did the patient bring in their glucometer from home?  No  How often do you monitor your CBG's? Once a day   Financial Strains and Diabetes Management:  Are you having any financial strains with the device, your supplies or your medication? No .  Does the patient want to be seen by Chronic Care Management for management of their diabetes?  No  Would the patient like to be referred to a Nutritionist or for Diabetic Management?  No   Diabetic Exams:  Diabetic Eye Exam: Completed 05/25/2018.   Diabetic Foot Exam: Pt has been advised about the importance in completing this exam.   Past Medical History:  Diagnosis Date  . Allergy   . Chronic headaches   . GERD (gastroesophageal reflux disease)   . Heart burn   . Hyperlipidemia   . Hypertension    Past Surgical History:  Procedure Laterality Date  . KNEE SURGERY    . SHOULDER SURGERY    . TONSILLECTOMY     Family History  Problem Relation Age of Onset  . Cancer Father  lung  . Heart disease Sister   . Heart disease Brother   . Prostate cancer Neg Hx    Social History   Socioeconomic History  . Marital status: Married    Spouse name: Not on file  . Number of children: Not on file  . Years of education: Not on file  . Highest education level: Not on file  Occupational History  . Occupation: retired  Engineer, productionocial Needs  . Financial resource strain: Not hard at all  . Food insecurity    Worry: Never true    Inability: Never true  . Transportation needs    Medical: No    Non-medical: No  Tobacco Use  . Smoking status: Former Smoker    Types: Cigars     Quit date: 2000    Years since quitting: 20.6  . Smokeless tobacco: Current User    Types: Chew  . Tobacco comment: wife states patient never smoked cigarettes, just chewing tobacco  Substance and Sexual Activity  . Alcohol use: Not Currently    Alcohol/week: 0.0 standard drinks  . Drug use: No  . Sexual activity: Yes    Birth control/protection: Inserts  Lifestyle  . Physical activity    Days per week: 0 days    Minutes per session: 0 min  . Stress: Not at all  Relationships  . Social connections    Talks on phone: More than three times a week    Gets together: More than three times a week    Attends religious service: Never    Active member of club or organization: No    Attends meetings of clubs or organizations: Never    Relationship status: Married  Other Topics Concern  . Not on file  Social History Narrative  . Not on file    Outpatient Encounter Medications as of 11/06/2018  Medication Sig  . ACCU-CHEK SMARTVIEW test strip Check sugar 2 times daily  . allopurinol (ZYLOPRIM) 100 MG tablet Take 1 tablet (100 mg total) by mouth at bedtime.  Marland Kitchen. amLODipine (NORVASC) 2.5 MG tablet TAKE 1 TABLET BY MOUTH AT BEDTIME  . Exenatide ER (BYDUREON BCISE) 2 MG/0.85ML AUIJ Inject 2 mg as directed once a week.  . fluticasone (FLONASE) 50 MCG/ACT nasal spray USE 2 SPRAY(S) IN EACH NOSTRIL ONCE DAILY USE FOR 4-6 WEEKS THEN STOP AND USE SEASONALLY OR AS NEEDED  . Insulin Glargine (LANTUS SOLOSTAR) 100 UNIT/ML Solostar Pen Inject 16 Units into the skin daily at 10 pm.  . Insulin Pen Needle (NOVOFINE PLUS) 32G X 4 MM MISC Use with Lantus insulin daily injection as instructed  . lisinopril (PRINIVIL,ZESTRIL) 40 MG tablet Take 1 tablet (40 mg total) by mouth daily.  . metFORMIN (GLUCOPHAGE-XR) 750 MG 24 hr tablet Take 2 tablets (1,500 mg total) by mouth daily with breakfast. May start with only 1 tab, and increase as tolerated.  Marland Kitchen. omeprazole (PRILOSEC) 20 MG capsule Take 1 capsule (20 mg  total) by mouth daily.  . colchicine 0.6 MG tablet Take 1 tablet (0.6 mg total) by mouth as needed (gout flare). (Patient not taking: Reported on 11/06/2018)   No facility-administered encounter medications on file as of 11/06/2018.     Activities of Daily Living In your present state of health, do you have any difficulty performing the following activities: 11/06/2018  Hearing? Y  Comment hearing aids  Vision? N  Comment reading glasses  Difficulty concentrating or making decisions? N  Walking or climbing stairs? N  Dressing or bathing?  N  Doing errands, shopping? N  Preparing Food and eating ? N  Using the Toilet? N  In the past six months, have you accidently leaked urine? N  Do you have problems with loss of bowel control? N  Managing your Medications? N  Managing your Finances? N  Housekeeping or managing your Housekeeping? N  Some recent data might be hidden    Patient Care Team: Smitty CordsKaramalegos, Alexander J, DO as PCP - General (Family Medicine)   Assessment:   This is a routine wellness examination for Daniel FallenKenneth.  Exercise Activities and Dietary recommendations Current Exercise Habits: The patient does not participate in regular exercise at present, Exercise limited by: orthopedic condition(s)  Goals    . Quit Smoking     Tobacco cessation discussed       Fall Risk: Fall Risk  08/28/2018 04/23/2018 01/15/2018 08/29/2017 03/13/2017  Falls in the past year? 0 0 No No No  Number falls in past yr: - - - - -  Injury with Fall? - - - - -  Follow up Falls evaluation completed Falls evaluation completed - - -    FALL RISK PREVENTION PERTAINING TO THE HOME:  Any stairs in or around the home? No  If so, are there any without handrails? No   Home free of loose throw rugs in walkways, pet beds, electrical cords, etc? Yes  Adequate lighting in your home to reduce risk of falls? Yes   ASSISTIVE DEVICES UTILIZED TO PREVENT FALLS:  Life alert? No  Use of a cane, walker or w/c?  No  Grab bars in the bathroom? No  Shower chair or bench in shower? No  Elevated toilet seat or a handicapped toilet? No   TIMED UP AND GO:  Unable to perform   Depression Screen PHQ 2/9 Scores 08/28/2018 04/23/2018 01/15/2018 08/29/2017  PHQ - 2 Score 0 0 0 0    Cognitive Function     6CIT Screen 08/29/2017  What Year? 0 points  What month? 0 points  What time? 0 points  Count back from 20 0 points  Months in reverse 0 points  Repeat phrase 2 points  Total Score 2    Immunization History  Administered Date(s) Administered  . Tdap 07/25/2016    Qualifies for Shingles Vaccine? Yes  Zostavax completed n/a. Due for Shingrix. Education has been provided regarding the importance of this vaccine. Pt has been advised to call insurance company to determine out of pocket expense. Advised may also receive vaccine at local pharmacy or Health Dept. Verbalized acceptance and understanding.  Tdap: up to date   Flu Vaccine: Due 11/2018  Pneumococcal Vaccine: declined  Screening Tests Health Maintenance  Topic Date Due  . FOOT EXAM  10/04/2018  . INFLUENZA VACCINE  10/27/2018  . PNA vac Low Risk Adult (1 of 2 - PCV13) 11/06/2019 (Originally 08/26/2016)  . HEMOGLOBIN A1C  12/02/2018  . OPHTHALMOLOGY EXAM  05/26/2019  . Fecal DNA (Cologuard)  05/15/2021  . TETANUS/TDAP  07/26/2026  . Hepatitis C Screening  Completed   Cancer Screenings:  Colorectal Screening: Completed cologuard 05/15/2018. Repeat every 3 years  Lung Cancer Screening: (Low Dose CT Chest recommended if Age 49-80 years, 30 pack-year currently smoking OR have quit w/in 15years.) does not qualify.    Additional Screening:  Hepatitis C Screening: does qualify; Completed 08/29/2017  Vision Screening: Recommended annual ophthalmology exams for early detection of glaucoma and other disorders of the eye. Is the patient up to date with their  annual eye exam?  Yes  Who is the provider or what is the name of the office in which  the pt attends annual eye exams? [place] [facility]   Dental Screening: Recommended annual dental exams for proper oral hygiene  Community Resource Referral:  CRR required this visit?  No        Plan:  I have personally reviewed and addressed the Medicare Annual Wellness questionnaire and have noted the following in the patient's chart:  A. Medical and social history B. Use of alcohol, tobacco or illicit drugs  C. Current medications and supplements D. Functional ability and status E.  Nutritional status F.  Physical activity G. Advance directives H. List of other physicians I.  Hospitalizations, surgeries, and ER visits in previous 12 months J.  Vitals K. Screenings such as hearing and vision if needed, cognitive and depression L. Referrals and appointments   In addition, I have reviewed and discussed with patient certain preventive protocols, quality metrics, and best practice recommendations. A written personalized care plan for preventive services as well as general preventive health recommendations were provided to patient.   Signed,   Collene SchlichterHill, Magaline Steinberg A, LPN  1/61/09608/01/2019 Nurse Health Advisor   Nurse Notes: none

## 2018-11-06 NOTE — Patient Instructions (Signed)
[name] , Thank you for taking time to come for your Medicare Wellness Visit. I appreciate your ongoing commitment to your health goals. Please review the following plan we discussed and let me know if I can assist you in the future.   Screening recommendations/referrals: Colonoscopy: cologuard completed 05/15/2018 Recommended yearly ophthalmology/optometry visit for glaucoma screening and checkup Recommended yearly dental visit for hygiene and checkup  Vaccinations: Influenza vaccine: declined Pneumococcal vaccine: declined Tdap vaccine: up to date Shingles vaccine: shingrix eligible    Advanced directives: please pick up a copy of this information next time your in the office.   Conditions/risks identified: diabetic - discussed chronic care management team.   Next appointment: Follow up in one year for your annual wellness visit.   Preventive Care 28 Years and Older, Male Preventive care refers to lifestyle choices and visits with your health care provider that can promote health and wellness. What does preventive care include?  A yearly physical exam. This is also called an annual well check.  Dental exams once or twice a year.  Routine eye exams. Ask your health care provider how often you should have your eyes checked.  Personal lifestyle choices, including:  Daily care of your teeth and gums.  Regular physical activity.  Eating a healthy diet.  Avoiding tobacco and drug use.  Limiting alcohol use.  Practicing safe sex.  Taking low doses of aspirin every day.  Taking vitamin and mineral supplements as recommended by your health care provider. What happens during an annual well check? The services and screenings done by your health care provider during your annual well check will depend on your age, overall health, lifestyle risk factors, and family history of disease. Counseling  Your health care provider may ask you questions about your:  Alcohol use.   Tobacco use.  Drug use.  Emotional well-being.  Home and relationship well-being.  Sexual activity.  Eating habits.  History of falls.  Memory and ability to understand (cognition).  Work and work Statistician. Screening  You may have the following tests or measurements:  Height, weight, and BMI.  Blood pressure.  Lipid and cholesterol levels. These may be checked every 5 years, or more frequently if you are over 53 years old.  Skin check.  Lung cancer screening. You may have this screening every year starting at age 62 if you have a 30-pack-year history of smoking and currently smoke or have quit within the past 15 years.  Fecal occult blood test (FOBT) of the stool. You may have this test every year starting at age 34.  Flexible sigmoidoscopy or colonoscopy. You may have a sigmoidoscopy every 5 years or a colonoscopy every 10 years starting at age 20.  Prostate cancer screening. Recommendations will vary depending on your family history and other risks.  Hepatitis C blood test.  Hepatitis B blood test.  Sexually transmitted disease (STD) testing.  Diabetes screening. This is done by checking your blood sugar (glucose) after you have not eaten for a while (fasting). You may have this done every 1-3 years.  Abdominal aortic aneurysm (AAA) screening. You may need this if you are a current or former smoker.  Osteoporosis. You may be screened starting at age 9 if you are at high risk. Talk with your health care provider about your test results, treatment options, and if necessary, the need for more tests. Vaccines  Your health care provider may recommend certain vaccines, such as:  Influenza vaccine. This is recommended every year.  Tetanus, diphtheria, and acellular pertussis (Tdap, Td) vaccine. You may need a Td booster every 10 years.  Zoster vaccine. You may need this after age 82.  Pneumococcal 13-valent conjugate (PCV13) vaccine. One dose is recommended  after age 74.  Pneumococcal polysaccharide (PPSV23) vaccine. One dose is recommended after age 59. Talk to your health care provider about which screenings and vaccines you need and how often you need them. This information is not intended to replace advice given to you by your health care provider. Make sure you discuss any questions you have with your health care provider. Document Released: 04/10/2015 Document Revised: 12/02/2015 Document Reviewed: 01/13/2015 Elsevier Interactive Patient Education  2017 [place] Prevention in the Home Falls can cause injuries. They can happen to people of all ages. There are many things you can do to make your home safe and to help prevent falls. What can I do on the outside of my home?  Regularly fix the edges of walkways and driveways and fix any cracks.  Remove anything that might make you trip as you walk through a door, such as a raised step or threshold.  Trim any bushes or trees on the path to your home.  Use bright outdoor lighting.  Clear any walking paths of anything that might make someone trip, such as rocks or tools.  Regularly check to see if handrails are loose or broken. Make sure that both sides of any steps have handrails.  Any raised decks and porches should have guardrails on the edges.  Have any leaves, snow, or ice cleared regularly.  Use sand or salt on walking paths during winter.  Clean up any spills in your garage right away. This includes oil or grease spills. What can I do in the bathroom?  Use night lights.  Install grab bars by the toilet and in the tub and shower. Do not use towel bars as grab bars.  Use non-skid mats or decals in the tub or shower.  If you need to sit down in the shower, use a plastic, non-slip stool.  Keep the floor dry. Clean up any water that spills on the floor as soon as it happens.  Remove soap buildup in the tub or shower regularly.  Attach bath mats securely with  double-sided non-slip rug tape.  Do not have throw rugs and other things on the floor that can make you trip. What can I do in the bedroom?  Use night lights.  Make sure that you have a light by your bed that is easy to reach.  Do not use any sheets or blankets that are too big for your bed. They should not hang down onto the floor.  Have a firm chair that has side arms. You can use this for support while you get dressed.  Do not have throw rugs and other things on the floor that can make you trip. What can I do in the kitchen?  Clean up any spills right away.  Avoid walking on wet floors.  Keep items that you use a lot in easy-to-reach places.  If you need to reach something above you, use a strong step stool that has a grab bar.  Keep electrical cords out of the way.  Do not use floor polish or wax that makes floors slippery. If you must use wax, use non-skid floor wax.  Do not have throw rugs and other things on the floor that can make you trip. What can I do  with my stairs?  Do not leave any items on the stairs.  Make sure that there are handrails on both sides of the stairs and use them. Fix handrails that are broken or loose. Make sure that handrails are as long as the stairways.  Check any carpeting to make sure that it is firmly attached to the stairs. Fix any carpet that is loose or worn.  Avoid having throw rugs at the top or bottom of the stairs. If you do have throw rugs, attach them to the floor with carpet tape.  Make sure that you have a light switch at the top of the stairs and the bottom of the stairs. If you do not have them, ask someone to add them for you. What else can I do to help prevent falls?  Wear shoes that:  Do not have high heels.  Have rubber bottoms.  Are comfortable and fit you well.  Are closed at the toe. Do not wear sandals.  If you use a stepladder:  Make sure that it is fully opened. Do not climb a closed stepladder.  Make  sure that both sides of the stepladder are locked into place.  Ask someone to hold it for you, if possible.  Clearly mark and make sure that you can see:  Any grab bars or handrails.  First and last steps.  Where the edge of each step is.  Use tools that help you move around (mobility aids) if they are needed. These include:  Canes.  Walkers.  Scooters.  Crutches.  Turn on the lights when you go into a dark area. Replace any light bulbs as soon as they burn out.  Set up your furniture so you have a clear path. Avoid moving your furniture around.  If any of your floors are uneven, fix them.  If there are any pets around you, be aware of where they are.  Review your medicines with your doctor. Some medicines can make you feel dizzy. This can increase your chance of falling. Ask your doctor what other things that you can do to help prevent falls. This information is not intended to replace advice given to you by your health care provider. Make sure you discuss any questions you have with your health care provider. Document Released: 01/08/2009 Document Revised: 08/20/2015 Document Reviewed: 04/18/2014 Elsevier Interactive Patient Education  2017 [employer].

## 2018-11-14 ENCOUNTER — Other Ambulatory Visit: Payer: Self-pay | Admitting: Family Medicine

## 2018-11-14 DIAGNOSIS — IMO0002 Reserved for concepts with insufficient information to code with codable children: Secondary | ICD-10-CM

## 2018-11-14 DIAGNOSIS — E1165 Type 2 diabetes mellitus with hyperglycemia: Secondary | ICD-10-CM

## 2018-11-14 DIAGNOSIS — E1121 Type 2 diabetes mellitus with diabetic nephropathy: Secondary | ICD-10-CM

## 2018-11-19 ENCOUNTER — Telehealth: Payer: Self-pay | Admitting: Family Medicine

## 2018-11-19 NOTE — Chronic Care Management (AMB) (Signed)
Patient needs a 4 pm appt. Emailed [name] to see if she would be able to accommodate him.

## 2018-11-19 NOTE — Chronic Care Management (AMB) (Signed)
Chronic Care Management   Note  11/19/2018 Name: [name] MRN: [id number] DOB: Jun 11, 1950  [name] is a 68 y.o. year old male who is a primary care patient of [name], DO. I reached out to [name] by phone today in response to a referral sent by [name]'s health plan.    [name] was given information about Chronic Care Management services today including:  1. CCM service includes personalized support from designated clinical staff supervised by his physician, including individualized plan of care and coordination with other care providers 2. 24/7 contact phone numbers for assistance for urgent and routine care needs. 3. Service will only be billed when office clinical staff spend 20 minutes or more in a month to coordinate care. 4. Only one practitioner may furnish and bill the service in a calendar month. 5. The patient may stop CCM services at any time (effective at the end of the month) by phone call to the office staff. 6. The patient will be responsible for cost sharing (co-pay) of up to 20% of the service fee (after annual deductible is met).  Patient agreed to services and verbal consent obtained.   Follow up plan: Telephone appointment with CCM team member scheduled for: 11/20/2018  [place]  ??[email or web address]   ??[phone]

## 2018-11-20 ENCOUNTER — Ambulatory Visit (INDEPENDENT_AMBULATORY_CARE_PROVIDER_SITE_OTHER): Payer: Medicare HMO | Admitting: Pharmacist

## 2018-11-20 DIAGNOSIS — E785 Hyperlipidemia, unspecified: Secondary | ICD-10-CM | POA: Diagnosis not present

## 2018-11-20 DIAGNOSIS — E1121 Type 2 diabetes mellitus with diabetic nephropathy: Secondary | ICD-10-CM

## 2018-11-20 DIAGNOSIS — M791 Myalgia, unspecified site: Secondary | ICD-10-CM

## 2018-11-20 DIAGNOSIS — Z794 Long term (current) use of insulin: Secondary | ICD-10-CM | POA: Diagnosis not present

## 2018-11-20 DIAGNOSIS — I10 Essential (primary) hypertension: Secondary | ICD-10-CM | POA: Diagnosis not present

## 2018-11-20 DIAGNOSIS — E1169 Type 2 diabetes mellitus with other specified complication: Secondary | ICD-10-CM | POA: Diagnosis not present

## 2018-11-20 DIAGNOSIS — T466X5A Adverse effect of antihyperlipidemic and antiarteriosclerotic drugs, initial encounter: Secondary | ICD-10-CM

## 2018-11-20 DIAGNOSIS — M1000 Idiopathic gout, unspecified site: Secondary | ICD-10-CM

## 2018-11-20 NOTE — Patient Instructions (Signed)
Thank you allowing the Chronic Care Management Team to be a part of your care! It was a pleasure speaking with you today!     CCM (Chronic Care Management) Team    Janci Minor RN, BSN Nurse Care Coordinator  [phone]   Harlow Asa PharmD  Clinical Pharmacist  [phone]   Eula Fried LCSW Clinical Social Worker [phone]  Visit Information  Goals Addressed            This Visit's Progress   . PharmD - "He's in the donut hole" (pt-stated)       Current Barriers:  . Financial Barriers - Patient currently in coverage gap of Part D plan . Lack of blood sugar results for clinical team  Pharmacist Clinical Goal(s):  Marland Kitchen Over the next 30 days, patient will work with CM Pharmacist and PCP to address needs related to medication assistance and diabetes management  Interventions: . Comprehensive medication review performed; medication list updated in electronic medical record o Reports rarely needing to use colchicine as gout symptoms controlled by taking allopurinol o Reports taking Bydureon 2 mg once weekly as directed and Lantus 18 units QHS  o Reports currently taking metformin ER 750 mg - 1 tablet daily, rather than 2 tablets daily - States that he has taken 2 tablets daily in the past, but has gone back to 1 tablet daily o Assess patient not currently on a statin - reports repeated issues of myalgias with statins, unable to tolerate . Counsel patient on importance of blood sugar control and monitoring o Denies currently keeping blood sugar log o Reports has been checking primarily at bedtime (~1-2 hours after supper) and recalls recent readings ranging 130s-160s o Denies any recent low blood sugars o Encourage patient to check blood sugar twice daily and keep log. To follow up for results in 1 week . Review signs of low blood sugar and how to manage lows . Encourage patient to check blood pressure, keep log of results and bring log with him to PCP  appointments o Reports having an upper arm monitor, but denies checking recently . Review medication assistance options with Mr. and Mrs. Dunford o Based on reported income, patient not eligible for Extra Help Subsidy o Based on reported income and year-to-date out of pocket medication expenses, patient meets requirements for both: - Patient Assistance Program for Bydureon through [place] for Lantus through [employer] . Will collaborate with [facility] CPhT to assist patient with applying for patient assistance for Bydureon through [employer] and for Lantus through [place] with Mr. and Mrs. supporting documents that will be needed, including out of pocket expense reports from patient's pharmacy  Patient Self Care Activities:  . Self administers medications as prescribed with assistance from his wife o Uses weekly pillbox as adherence tool . Attends all scheduled provider appointments . Calls pharmacy for medication refills . Calls provider office for new concerns or questions . To check daily blood sugars and keep log  Initial goal documentation        The patient verbalized understanding of instructions provided today and declined a print copy of patient instruction materials.   Telephone follow up appointment with care management team member scheduled for: 9/2 at 51 pm  Harlow Asa, PharmD, [place] [phone]

## 2018-11-20 NOTE — Chronic Care Management (AMB) (Signed)
Chronic Care Management   Note  11/20/2018 Name: [name] MRN: [id number] DOB: 10/05/1950   Subjective:   [name] is a 68 y.o. year old male who is a primary care patient of Smitty CordsKaramalegos, Alexander J, DO. The CCM team was consulted for assistance with chronic disease management and care coordination needs by patient's health plan.    I reached out to patient by phone today as scheduled by Care Guide. Complete medication review with [name] and [name]. [name].  Review of patient status, including review of consultants reports, laboratory and other test data, was performed as part of comprehensive evaluation and provision of chronic care management services.   Objective:  Lab Results  Component Value Date   CREATININE 1.25 12/14/2016   CREATININE 1.35 (H) 12/06/2016   CREATININE 1.60 (H) 12/05/2016    Lab Results  Component Value Date   HGBA1C 7.4 (H) 06/01/2018       Component Value Date/Time   CHOL 182 08/29/2017 0947   TRIG 101 08/29/2017 0947   HDL 40 08/29/2017 0947   CHOLHDL 4.6 08/29/2017 0947   LDLCALC 122 (H) 08/29/2017 0947    ASCVD Risk: The 10-year ASCVD risk score Denman George(Goff DC Jr., et al., 2013) is: 44%   Values used to calculate the score:     Age: [age] years     Sex: Male     Is Non-Hispanic African American: No     Diabetic: Yes     Tobacco smoker: No     Systolic Blood Pressure: 161 mmHg     Is BP treated: Yes     HDL Cholesterol: 40 mg/dL     Total Cholesterol: 182 mg/dL    BP Readings from Last 3 Encounters:  04/23/18 130/67  01/15/18 138/68  10/03/17 120/62    Allergies  Allergen Reactions  . Crestor [Rosuvastatin Calcium] Other (See Comments)    myalgia  . Invokana [Canagliflozin] Other (See Comments)    dehydrated  . Prednisone Other (See Comments)    Medications Reviewed Today    Reviewed by Daphane Shepherdhalla, Loranzo Desha A, [facility]PH (Pharmacist) on 11/20/18 at 1626  Med List Status: <None>  Medication Order Taking? Sig Documenting Provider  Last Dose Status Informant  ACCU-CHEK SMARTVIEW test strip [id number]  Check sugar 2 times daily Smitty CordsKaramalegos, Alexander J, DO  Active   allopurinol (ZYLOPRIM) 100 MG tablet [id number] Yes Take 1 tablet (100 mg total) by mouth at bedtime. Smitty CordsKaramalegos, Alexander J, DO Taking Active   amLODipine (NORVASC) 2.5 MG tablet [id number] Yes TAKE 1 TABLET BY MOUTH AT BEDTIME Smitty CordsKaramalegos, Alexander J, DO Taking Active   BYDUREON BCISE 2 MG/0.85ML AUIJ [id number] Yes INJECT 2 MG INTO THE SKIN ONCE A WEEK. Smitty CordsKaramalegos, Alexander J, DO Taking Active            Med Note De Nurse(Gisele Pack A   Tue Nov 20, 2018  4:10 PM) On Mondays  colchicine 0.6 MG tablet [id number] No Take 1 tablet (0.6 mg total) by mouth as needed (gout flare).  Patient not taking: Reported on 11/06/2018   Smitty CordsKaramalegos, Alexander J, DO Not Taking Active   fluticasone [facility](FLONASE) 50 MCG/ACT nasal spray [id number] Yes USE 2 SPRAY(S) IN EACH NOSTRIL ONCE DAILY USE FOR 4-6 WEEKS THEN STOP AND USE SEASONALLY OR AS NEEDED Althea CharonKaramalegos, Netta NeatAlexander J, DO Taking Active   Insulin Glargine (LANTUS SOLOSTAR) 100 UNIT/ML Solostar Pen [id number] Yes Inject 16 Units into the skin daily at 10 pm.  Patient taking differently: Inject 18 Units into the skin  daily at 10 pm.    [name], DO Taking Active   Insulin Pen Needle (NOVOFINE PLUS) 32G X 4 MM MISC [id number]  Use with Lantus insulin daily injection as instructed [name], DO  Active   lisinopril (PRINIVIL,ZESTRIL) 40 MG tablet [id number] Yes Take 1 tablet (40 mg total) by mouth daily. [name], DO Taking Active   metFORMIN (GLUCOPHAGE-XR) 750 MG 24 hr tablet [id number] Yes Take 2 tablets (1,500 mg total) by mouth daily with breakfast. May start with only 1 tab, and increase as tolerated. [name], DO Taking Active   omeprazole (PRILOSEC) 20 MG capsule [id number] Yes Take 1 capsule (20 mg total) by mouth daily. [name], DO Taking Active             Assessment:   Goals Addressed            This Visit's Progress   . PharmD - "He's in the donut hole" (pt-stated)       Current Barriers:  . Financial Barriers - Patient currently in coverage gap of Part D plan . Lack of blood sugar results for clinical team  Pharmacist Clinical Goal(s):  Marland Kitchen Over the next 30 days, patient will work with CM Pharmacist and PCP to address needs related to medication assistance and diabetes management  Interventions: . Comprehensive medication review performed; medication list updated in electronic medical record o Reports rarely needing to use colchicine as gout symptoms controlled by taking allopurinol o Reports taking Bydureon 2 mg once weekly as directed and Lantus 18 units QHS  o Reports currently taking metformin ER 750 mg - 1 tablet daily, rather than 2 tablets daily - States that he has taken 2 tablets daily in the past, but has gone back to 1 tablet daily (unsure if this was due to tolerability or forgot) o Assess patient not currently on a statin - reports repeated issues of myalgias with statins, unable to tolerate . Counsel patient on importance of blood sugar control and monitoring o Denies currently keeping blood sugar log o Reports has been checking primarily at bedtime (~1-2 hours after supper) and recalls recent readings ranging 130s-160s o Denies any recent low blood sugars o Encourage patient to check blood sugar twice daily and keep log. To follow up for results in 1 week . Review signs of low blood sugar and how to manage lows . Encourage patient to check blood pressure, keep log of results and bring log with him to PCP appointments o Reports having an upper arm monitor, but denies checking recently . Review medication assistance options with [name]. and [name]. [place] o Based on reported income, patient not eligible for Extra Help Subsidy o Based on reported income and year-to-date out of pocket medication expenses, patient meets  requirements for both: - Patient Assistance Program for Bydureon through [employer] - Patient Assistance Program for Lantus through [employer] . Will collaborate with [facility] to assist patient with applying for patient assistance for Bydureon through [employer] and for Lantus through [employer] o Review with [name]. and [name]. supporting documents that will be needed, including out of pocket expense reports from patient's pharmacy  Patient Self Care Activities:  . Self administers medications as prescribed with assistance from his wife o Uses weekly pillbox as adherence tool . Attends all scheduled provider appointments . Calls pharmacy for medication refills . Calls provider office for new concerns or questions . To check daily blood sugars and keep log  Initial goal  documentation        Plan:  Telephone follow up appointment with care management team member scheduled for: 9/2 at 66 pm  Harlow Asa, PharmD, [place] [phone]

## 2018-11-22 ENCOUNTER — Other Ambulatory Visit: Payer: Self-pay | Admitting: Pharmacy Technician

## 2018-11-22 NOTE — Patient Outreach (Signed)
[facility]) Care Management  11/22/2018  [name] 1950/07/26 [id number]                                        Medication Assistance Referral                                      Medication Assistance Referral  Referral From: [place] (Clinic RPh)  Medication/Company: [name] / [name] Patient application portion:  Mailed Provider application portion: Faxed  to Dr. [name] Provider address/fax verified via: Office website  Medication/Company: Artis Flock / [employer] Patient application portion:  Mailed Provider application portion: Faxed  to Dr. [name] Provider address/fax verified via: Office website    Follow up:  Will follow up with patient in 5-10 business days to confirm application(s) have been received.  [name] P. [name], [facility] Management [phone]

## 2018-11-28 ENCOUNTER — Ambulatory Visit: Payer: Self-pay | Admitting: Pharmacist

## 2018-11-28 ENCOUNTER — Ambulatory Visit (INDEPENDENT_AMBULATORY_CARE_PROVIDER_SITE_OTHER): Payer: Medicare HMO | Admitting: Family Medicine

## 2018-11-28 DIAGNOSIS — E1121 Type 2 diabetes mellitus with diabetic nephropathy: Secondary | ICD-10-CM

## 2018-11-28 DIAGNOSIS — M1000 Idiopathic gout, unspecified site: Secondary | ICD-10-CM

## 2018-11-28 DIAGNOSIS — N183 Chronic kidney disease, stage 3 unspecified: Secondary | ICD-10-CM

## 2018-11-28 DIAGNOSIS — Z794 Long term (current) use of insulin: Secondary | ICD-10-CM

## 2018-11-28 DIAGNOSIS — I129 Hypertensive chronic kidney disease with stage 1 through stage 4 chronic kidney disease, or unspecified chronic kidney disease: Secondary | ICD-10-CM

## 2018-11-28 DIAGNOSIS — E1169 Type 2 diabetes mellitus with other specified complication: Secondary | ICD-10-CM

## 2018-11-28 DIAGNOSIS — R972 Elevated prostate specific antigen [PSA]: Secondary | ICD-10-CM

## 2018-11-28 DIAGNOSIS — E785 Hyperlipidemia, unspecified: Secondary | ICD-10-CM

## 2018-11-28 NOTE — Assessment & Plan Note (Signed)
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 CONTINUE Bydureon BCise 2mg  weekly inj - Continue Metformin XR 750 BID - Continue Lantus higher dose now at 18u daily - may increase dose if fasting sugar >150 on avg 2. Encourage improved lifestyle - low carb, low sugar diet, reduce portion size, continue improving regular exercise 3. Check CBG, bring log to next visit for review 4. Continue ACEi 5. Follow-up 3 months - DM A1c face to face

## 2018-11-28 NOTE — Chronic Care Management (AMB) (Signed)
Chronic Care Management   Follow Up Note   11/28/2018 Name: Daniel SanesKenneth Eugene Faux MRN: [id number] DOB: 01/06/1951  Referred by: [name], Daniel J, DO Reason for referral : Chronic Care Management (Patient Phone Call)   [name] is a 68 y.o. year old male who is a primary care patient of [name], Daniel J, DO. The CCM team was consulted for assistance with chronic disease management and care coordination needs.  [name] has a past medical history including but not limited to type 2 diabetes mellitus, hypertension, hyperlipidemia, GERD, gout, seasonal allergies and chronic back pain  I reached out to [name] by phone today.   Review of patient status, including review of consultants reports, relevant laboratory and other test results, and collaboration with appropriate care team members and the patient's provider was performed as part of comprehensive patient evaluation and provision of chronic care management services.    SDOH (Social Determinants of Health) screening performed today: Physical Activity. See Care Plan for related entries.   Outpatient Encounter Medications as of 11/28/2018  Medication Sig Note  . BYDUREON BCISE 2 MG/0.85ML AUIJ INJECT 2 MG INTO THE SKIN ONCE A WEEK. 11/20/2018: On Mondays  . Insulin Glargine (LANTUS SOLOSTAR) 100 UNIT/ML Solostar Pen Inject 16 Units into the skin daily at 10 pm. (Patient taking differently: Inject 18 Units into the skin daily at 10 pm. )   . metFORMIN (GLUCOPHAGE-XR) 750 MG 24 hr tablet Take 2 tablets (1,500 mg total) by mouth daily with breakfast. May start with only 1 tab, and increase as tolerated. (Patient taking differently: Take 750 mg by mouth 2 (two) times daily. )   . ACCU-CHEK SMARTVIEW test strip Check sugar 2 times daily   . allopurinol (ZYLOPRIM) 100 MG tablet Take 1 tablet (100 mg total) by mouth at bedtime.   Marland Kitchen. amLODipine (NORVASC) 2.5 MG tablet TAKE 1 TABLET BY MOUTH AT BEDTIME   . colchicine 0.6 MG  tablet Take 1 tablet (0.6 mg total) by mouth as needed (gout flare).   . fluticasone (FLONASE) 50 MCG/ACT nasal spray USE 2 SPRAY(S) IN EACH NOSTRIL ONCE DAILY USE FOR 4-6 WEEKS THEN STOP AND USE SEASONALLY OR AS NEEDED   . Insulin Pen Needle (NOVOFINE PLUS) 32G X 4 MM MISC Use with Lantus insulin daily injection as instructed   . lisinopril (PRINIVIL,ZESTRIL) 40 MG tablet Take 1 tablet (40 mg total) by mouth daily.   Marland Kitchen. omeprazole (PRILOSEC) 20 MG capsule Take 1 capsule (20 mg total) by mouth daily.    No facility-administered encounter medications on file as of 11/28/2018.     Goals Addressed            This Visit's Progress   . PharmD - "He's in the donut hole" (pt-stated)       Current Barriers:  . Financial Barriers - Patient currently in coverage gap of Part D plan . Lack of blood sugar results for clinical team  Pharmacist Clinical Goal(s):  Marland Kitchen. Over the next 30 days, patient will work with CM Pharmacist and PCP to address needs related to medication assistance and diabetes management  Interventions: . Counsel patient on importance of blood sugar control and monitoring o Reports increased his metformin ER 750 mg from 1 tablet daily back to 1 tablet twice daily starting on 8/31 o Review recent blood sugars with patient (see below) o Denies any recent low blood sugars o Admits to poor dietary control over the past week.  - Reports skipping meals  several times due to being busy and then overeating with next meal. o Encourage patient to focus on eating regular and balanced meals - Provide patient with verbal education on nutrition and diabetes o Patient reports keeping active every day, with some limitation due to his chronic back pain - Reports he has a planned back surgery that has been posponed due to the COVID-19 pandemic . Will mail patient diabetes education materials, blood sugar log and blood pressure log . Collaborating with [place] to assist patient with applying for  patient assistance for Bydureon through [place] and for Lantus through [employer] o Per chart, [facility] mailed out application to patient on 8/27 o Patient denies receiving application as of yet . Counsel on strategies for COVID-19 prevention including social distancing and face masking.  Patient Self Care Activities:  . Self administers medications as prescribed with assistance from his wife o Uses weekly pillbox as adherence tool . Attends all scheduled provider appointments . Calls pharmacy for medication refills . Calls provider office for new concerns or questions . To check blood sugars twice daily and keep log Date Fasting Blood Glucose Before Supper After Supper Notes  27 - August 167  197   28 - August 190  244* *Had burgers  29 - August 162  -   30 - August -  211   31 - August 171  249 *Started taking metformin BID  1 - September - 136    2 - September 173   * vanilla wafers and banana at bedtime    Please see past updates related to this goal by clicking on the "Past Updates" button in the selected goal         Plan  Telephone follow up appointment with care management team member scheduled for: 9/11 at [place], PharmD, [place] [phone]

## 2018-11-28 NOTE — Patient Instructions (Signed)
Thank you allowing the Chronic Care Management Team to be a part of your care! It was a pleasure speaking with you today!     CCM (Chronic Care Management) Team    Janci Minor RN, BSN Nurse Care Coordinator  [phone]   Harlow Asa PharmD  Clinical Pharmacist  [phone]   Eula Fried LCSW Clinical Social Worker [phone]  Visit Information  Goals Addressed            This Visit's Progress   . PharmD - "He's in the donut hole" (pt-stated)       Current Barriers:  . Financial Barriers - Patient currently in coverage gap of Part D plan . Lack of blood sugar results for clinical team  Pharmacist Clinical Goal(s):  Marland Kitchen Over the next 30 days, patient will work with CM Pharmacist and PCP to address needs related to medication assistance and diabetes management  Interventions: . Counsel patient on importance of blood sugar control and monitoring o Reports increased his metformin ER 750 mg from 1 tablet daily back to 1 tablet twice daily starting on 8/31 o Review recent blood sugars with patient (see below) o Denies any recent low blood sugars o Admits to poor dietary control over the past week.  - Reports skipping meals several times due to being busy and then overeating with next meal. o Encourage patient to focus on eating regular and balanced meals - Provide patient with verbal education on nutrition and diabetes o Patient reports keeping active every day, with some limitation due to his chronic back pain - Reports he has a planned back surgery that has been posponed due to the COVID-19 pandemic . Will mail patient diabetes education materials, blood sugar log and blood pressure log . Collaborating with [place] to assist patient with applying for patient assistance for Bydureon through [place] and for Lantus through [employer] o Per chart, [facility] mailed out application to patient on 8/27 o Patient denies receiving application as of yet . Counsel on  strategies for COVID-19 prevention including social distancing and face masking.  Patient Self Care Activities:  . Self administers medications as prescribed with assistance from his wife o Uses weekly pillbox as adherence tool . Attends all scheduled provider appointments . Calls pharmacy for medication refills . Calls provider office for new concerns or questions . To check blood sugars twice daily and keep log Date Fasting Blood Glucose Before Supper After Supper Notes  27 - August 167  197   28 - August 190  244* *Had burgers  29 - August 162  -   30 - August -  211   31 - August 171  249 *Started taking metformin BID  1 - September - 136    2 - September 173   * vanilla wafers and banana at bedtime    Please see past updates related to this goal by clicking on the "Past Updates" button in the selected goal         The patient verbalized understanding of instructions provided today and declined a print copy of patient instruction materials.   Telephone follow up appointment with care management team member scheduled for: 9/11 at [place], PharmD, [place] [phone]

## 2018-11-28 NOTE — Patient Instructions (Addendum)
DUE for FASTING BLOOD WORK (no food or drink after midnight before the lab appointment, only water or coffee without cream/sugar on the morning of)  [facility] this week  For Lab Results, once available within 2-3 days of blood draw, you can can log in to MyChart online to view your results and a brief explanation. Also, we can discuss results at next follow-up visit.   Please schedule a Follow-up Appointment to: Return in about 3 months (around 02/27/2019) for DM A1c.  If you have any other questions or concerns, please feel free to call the office or send a message through [place]. You may also schedule an earlier appointment if necessary.  Additionally, you may be receiving a survey about your experience at our office within a few days to 1 week by e-mail or mail. We value your feedback.  Nobie Putnam, DO [place]

## 2018-11-28 NOTE — Progress Notes (Signed)
Virtual Visit via Telephone The purpose of this virtual visit is to provide medical care while limiting exposure to the novel coronavirus (COVID19) for both patient and office staff.  Consent was obtained for phone visit:  Yes.   Answered questions that patient had about telehealth interaction:  Yes.   I discussed the limitations, risks, security and privacy concerns of performing an evaluation and management service by telephone. I also discussed with the patient that there may be a patient responsible charge related to this service. The patient expressed understanding and agreed to proceed.  Patient Location: Home Provider Location: [facility])  ---------------------------------------------------------------------- Chief Complaint  Patient presents with  . Diabetes    3 month follow up  . Hypertension    S: Reviewed CMA documentation. I have called patient and gathered additional HPI as follows:  CHRONIC DM, Type 2with nephropathy / CKD-III Last visit 05/2018, no change to meds since last He admits increased appetite eating more portion than last time - higher sugars. CBGs: Avg approx 150, Low >100, High < 250. Checks CBGs 1-2 daily Meds: - CONTINUE Bydureon BCise 2mg  weekly inj - Continue Metformin XR 750 x 2 in PM (he was taking BID) - Continue Lantus back at 18u daily - may increase dose if fasting sugar >150 on avg Reports  good compliance. Tolerating well w/o side-effects Currently on ACEi Lifestyle: - Diet (improved DM Diet, except now still increased sweets) - Exercise limited Denies hypoglycemia, polyuria, visual changes, numbness or tingling.  Denies any high risk travel to areas of current concern for COVID19. Denies any known or suspected exposure to person with or possibly with COVID19.  Denies any fevers, chills, sweats, body ache, cough, shortness of breath, sinus pain or pressure, headache, abdominal pain, diarrhea  Past Medical  History:  Diagnosis Date  . Allergy   . Chronic headaches   . GERD (gastroesophageal reflux disease)   . Heart burn   . Hyperlipidemia   . Hypertension    Social History   Tobacco Use  . Smoking status: Former Smoker    Types: Cigars    Quit date: 2000    Years since quitting: 20.6  . Smokeless tobacco: Current User    Types: Chew  . Tobacco comment: wife states patient never smoked cigarettes, just chewing tobacco  Substance Use Topics  . Alcohol use: Not Currently    Alcohol/week: 0.0 standard drinks  . Drug use: No    Current Outpatient Medications:  .  allopurinol (ZYLOPRIM) 100 MG tablet, Take 1 tablet (100 mg total) by mouth at bedtime., Disp: 90 tablet, Rfl: 3 .  amLODipine (NORVASC) 2.5 MG tablet, TAKE 1 TABLET BY MOUTH AT BEDTIME, Disp: 90 tablet, Rfl: 1 .  BYDUREON BCISE 2 MG/0.85ML AUIJ, INJECT 2 MG INTO THE SKIN ONCE A WEEK., Disp: 12 mL, Rfl: 1 .  colchicine 0.6 MG tablet, Take 1 tablet (0.6 mg total) by mouth as needed (gout flare)., Disp: 90 tablet, Rfl: 3 .  fluticasone (FLONASE) 50 MCG/ACT nasal spray, USE 2 SPRAY(S) IN EACH NOSTRIL ONCE DAILY USE FOR 4-6 WEEKS THEN STOP AND USE SEASONALLY OR AS NEEDED, Disp: 48 g, Rfl: 1 .  Insulin Glargine (LANTUS SOLOSTAR) 100 UNIT/ML Solostar Pen, Inject 16 Units into the skin daily at 10 pm. (Patient taking differently: Inject 18 Units into the skin daily at 10 pm. ), Disp: 45 mL, Rfl: 3 .  Insulin Pen Needle (NOVOFINE PLUS) 32G X 4 MM MISC, Use with Lantus insulin  daily injection as instructed, Disp: 90 each, Rfl: 3 .  lisinopril (PRINIVIL,ZESTRIL) 40 MG tablet, Take 1 tablet (40 mg total) by mouth daily., Disp: 90 tablet, Rfl: 3 .  metFORMIN (GLUCOPHAGE-XR) 750 MG 24 hr tablet, Take 2 tablets (1,500 mg total) by mouth daily with breakfast. May start with only 1 tab, and increase as tolerated. (Patient taking differently: Take 750 mg by mouth daily with breakfast. May start with only 1 tab, and increase as tolerated.), Disp:  180 tablet, Rfl: 3 .  omeprazole (PRILOSEC) 20 MG capsule, Take 1 capsule (20 mg total) by mouth daily., Disp: 90 capsule, Rfl: 3 .  ACCU-CHEK SMARTVIEW test strip, Check sugar 2 times daily, Disp: 100 each, Rfl: 5  [facility] screen [facility] 08/28/2018 04/23/2018 01/15/2018  Decreased Interest 0 0 0  Down, Depressed, Hopeless 0 0 0  PHQ - 2 Score 0 0 0    No flowsheet data found.  -------------------------------------------------------------------------- O: No physical exam performed due to remote telephone encounter.  Lab results reviewed.  No results found for this or any previous visit (from the past 2160 hour(s)).  -------------------------------------------------------------------------- A&P:  Problem List Items Addressed This Visit    CKD (chronic kidney disease), stage III (HCC) Check lab chemistry for Creatinine trend Secondary to DM/HTN    Elevated PSA, less than 10 ng/ml Due for PSA lab    Gout Stable without flare Check Uric acid    Hyperlipidemia associated with type 2 diabetes mellitus (HCC)    Type 2 diabetes mellitus with diabetic nephropathy (HCC) - Primary    Overdue A1c, he will go to lab now next day since did not come in for face to face visit Previously improved A1c back down to 7.4, still with hyperglycemia No hypoglycemia Complications - CKD-III nephropathy, other neuropathy from spine but less likely DM neuropathy, other including hyperlipidemia, GERD, obesity - increases risk of future cardiovascular complications  Off Tradjenta, duplicate therapy/cost, Invokana Failed Trulicity 0.75 and 1.5mg  weekly d/t GI intolerance  Plan:  1. CONTINUE Bydureon BCise 2mg  weekly inj - Continue Metformin XR 750 BID - Continue Lantus higher dose now at 18u daily - may increase dose if fasting sugar >150 on avg 2. Encourage improved lifestyle - low carb, low sugar diet, reduce portion size, continue improving regular exercise 3. Check CBG, bring log to next visit  for review 4. Continue ACEi 5. Follow-up 3 months - DM A1c face to face - Will be due for DM Foot exam anytime next visit face to face       Other Visit Diagnoses    Benign hypertension with CKD (chronic kidney disease) stage III (HCC)     Controlled Monitor Home BP Check Creatinine lab Follow-up      No orders of the defined types were placed in this encounter.   Follow-up: - Return in 3 months for DM A1c Face to Face - Future labs ordered for [facility] release now  Patient verbalizes understanding with the above medical recommendations including the limitation of remote medical advice.  Specific follow-up and call-back criteria were given for patient to follow-up or seek medical care more urgently if needed.   - Time spent in direct consultation with patient on phone: 9 minutes   Saralyn PilarAlexander Karamalegos, DO [facility] 11/28/2018, 8:17 AM

## 2018-11-29 DIAGNOSIS — I129 Hypertensive chronic kidney disease with stage 1 through stage 4 chronic kidney disease, or unspecified chronic kidney disease: Secondary | ICD-10-CM | POA: Diagnosis not present

## 2018-11-29 DIAGNOSIS — Z794 Long term (current) use of insulin: Secondary | ICD-10-CM | POA: Diagnosis not present

## 2018-11-29 DIAGNOSIS — E1121 Type 2 diabetes mellitus with diabetic nephropathy: Secondary | ICD-10-CM | POA: Diagnosis not present

## 2018-11-29 DIAGNOSIS — E785 Hyperlipidemia, unspecified: Secondary | ICD-10-CM | POA: Diagnosis not present

## 2018-11-29 DIAGNOSIS — M1000 Idiopathic gout, unspecified site: Secondary | ICD-10-CM | POA: Diagnosis not present

## 2018-11-29 DIAGNOSIS — N183 Chronic kidney disease, stage 3 (moderate): Secondary | ICD-10-CM | POA: Diagnosis not present

## 2018-11-29 DIAGNOSIS — R972 Elevated prostate specific antigen [PSA]: Secondary | ICD-10-CM | POA: Diagnosis not present

## 2018-11-29 DIAGNOSIS — E1165 Type 2 diabetes mellitus with hyperglycemia: Secondary | ICD-10-CM | POA: Diagnosis not present

## 2018-11-29 DIAGNOSIS — E1169 Type 2 diabetes mellitus with other specified complication: Secondary | ICD-10-CM | POA: Diagnosis not present

## 2018-11-30 ENCOUNTER — Other Ambulatory Visit: Payer: Self-pay | Admitting: Pharmacy Technician

## 2018-11-30 LAB — COMPREHENSIVE METABOLIC PANEL
ALT: 17 IU/L (ref 0–44)
AST: 24 IU/L (ref 0–40)
Albumin/Globulin Ratio: 1.5 (ref 1.2–2.2)
Albumin: 4.4 g/dL (ref 3.8–4.8)
Alkaline Phosphatase: 58 IU/L (ref 39–117)
BUN/Creatinine Ratio: 15 (ref 10–24)
BUN: 26 mg/dL (ref 8–27)
Bilirubin Total: 0.4 mg/dL (ref 0.0–1.2)
CO2: 22 mmol/L (ref 20–29)
Calcium: 10.1 mg/dL (ref 8.6–10.2)
Chloride: 102 mmol/L (ref 96–106)
Creatinine, Ser: 1.7 mg/dL — ABNORMAL HIGH (ref 0.76–1.27)
GFR calc Af Amer: 47 mL/min/{1.73_m2} — ABNORMAL LOW (ref >59)
GFR calc non Af Amer: 41 mL/min/{1.73_m2} — ABNORMAL LOW (ref >59)
Globulin, Total: 2.9 g/dL (ref 1.5–4.5)
Glucose: 153 mg/dL — ABNORMAL HIGH (ref 65–99)
Potassium: 5 mmol/L (ref 3.5–5.2)
Sodium: 139 mmol/L (ref 134–144)
Total Protein: 7.3 g/dL (ref 6.0–8.5)

## 2018-11-30 LAB — CBC WITH DIFFERENTIAL/PLATELET
Basophils Absolute: 0.1 10*3/uL (ref 0.0–0.2)
Basos: 1 %
EOS (ABSOLUTE): 0.2 10*3/uL (ref 0.0–0.4)
Eos: 3 %
Hematocrit: 42.6 % (ref 37.5–51.0)
Hemoglobin: 14 g/dL (ref 13.0–17.7)
Immature Grans (Abs): 0 10*3/uL (ref 0.0–0.1)
Immature Granulocytes: 0 %
Lymphocytes Absolute: 2 10*3/uL (ref 0.7–3.1)
Lymphs: 26 %
MCH: 28.9 pg (ref 26.6–33.0)
MCHC: 32.9 g/dL (ref 31.5–35.7)
MCV: 88 fL (ref 79–97)
Monocytes Absolute: 0.5 10*3/uL (ref 0.1–0.9)
Monocytes: 7 %
Neutrophils Absolute: 5 10*3/uL (ref 1.4–7.0)
Neutrophils: 63 %
Platelets: 302 10*3/uL (ref 150–450)
RBC: 4.85 x10E6/uL (ref 4.14–5.80)
RDW: 13.7 % (ref 11.6–15.4)
WBC: 7.8 10*3/uL (ref 3.4–10.8)

## 2018-11-30 LAB — LIPID PANEL
Chol/HDL Ratio: 5.6 ratio — ABNORMAL HIGH (ref 0.0–5.0)
Cholesterol, Total: 200 mg/dL — ABNORMAL HIGH (ref 100–199)
HDL: 36 mg/dL — ABNORMAL LOW (ref >39)
LDL Chol Calc (NIH): 134 mg/dL — ABNORMAL HIGH (ref 0–99)
Triglycerides: 168 mg/dL — ABNORMAL HIGH (ref 0–149)
VLDL Cholesterol Cal: 30 mg/dL (ref 5–40)

## 2018-11-30 LAB — HEMOGLOBIN A1C
Est. average glucose Bld gHb Est-mCnc: 166 mg/dL
Hgb A1c MFr Bld: 7.4 % — ABNORMAL HIGH (ref 4.8–5.6)

## 2018-11-30 LAB — URIC ACID: Uric Acid: 6.9 mg/dL (ref 3.7–8.6)

## 2018-11-30 LAB — PSA: Prostate Specific Ag, Serum: 5.4 ng/mL — ABNORMAL HIGH (ref 0.0–4.0)

## 2018-11-30 NOTE — Patient Outreach (Signed)
[place] [facility]) Care Management  11/30/2018  Tyronne Blann 02-18-1951 [id number]  Incoming call received in regards to patient's application with [name] for Bydureon and [employer] for Lantus.  Spoke to patient's wife, [name]. HIPAA identifiers confirmed and verified.  Patient's wife was calling with questions regarding the applications. She informed she had the pharmacy printouts and proof of income. She inquired which documents were needed in addition to the application. Informed patient the patient assistance companies required both her and her husband's proof of income but only needed her husband's OOP expenditure. Patient's wife verbalized understanding. She informed she would place in the mail by this weekend.  Will followup with patient in 10-15 business days if application has not been received back.  Khalia Gong P. Kary Sugrue, [place] Management [phone] s

## 2018-12-06 ENCOUNTER — Other Ambulatory Visit: Payer: Self-pay | Admitting: Pharmacy Technician

## 2018-12-06 NOTE — Patient Outreach (Signed)
[place] [facility]) Care Management  12/06/2018  [name] 1950/11/29 [id number]    Successful outreach attempt made to patient in regards to [name] application for Bydureon and [employer] application for Lantus.  Spoke to patient's wife,  HIPAA identifiers verified.  Informed patient that I would submit applications as are  but that the companies may require patient to submit proof of household income. Patient's wife verbalized understanding.   Plan:will submit patient assistance applications to [employer], will submit [name] application once provider portion is received back.  Karver Fadden P. Shann Merrick, [place] Management [phone]

## 2018-12-06 NOTE — Patient Outreach (Signed)
[place] [facility]) Care Management  12/06/2018  Ozro Russett 1950-05-27 [id number]   Received all necessary documents and signatures from both patient and provider for [employer] patient assistance for Lantus.  Submitted application via fax to [employer].  Will followup with [employer] in 2-5 business days to inquire on status of application.  Lovetta Condie P. Takesha Steger, [place] Management [phone]

## 2018-12-07 ENCOUNTER — Ambulatory Visit (INDEPENDENT_AMBULATORY_CARE_PROVIDER_SITE_OTHER): Payer: Medicare HMO | Admitting: Pharmacist

## 2018-12-07 DIAGNOSIS — E1121 Type 2 diabetes mellitus with diabetic nephropathy: Secondary | ICD-10-CM | POA: Diagnosis not present

## 2018-12-07 DIAGNOSIS — Z794 Long term (current) use of insulin: Secondary | ICD-10-CM | POA: Diagnosis not present

## 2018-12-07 DIAGNOSIS — I10 Essential (primary) hypertension: Secondary | ICD-10-CM

## 2018-12-07 NOTE — Patient Instructions (Signed)
Thank you allowing the Chronic Care Management Team to be a part of your care! It was a pleasure speaking with you today!     CCM (Chronic Care Management) Team    Janci Minor RN, BSN Nurse Care Coordinator  [phone]   Harlow Asa PharmD  Clinical Pharmacist  [phone]   Eula Fried LCSW Clinical Social Worker [phone]  Visit Information  Goals Addressed            This Visit's Progress   . PharmD - "He's in the donut hole" (pt-stated)       Current Barriers:  . Financial Barriers - Patient currently in coverage gap of Part D plan . Lack of blood sugar results for clinical team  Pharmacist Clinical Goal(s):  Marland Kitchen Over the next 30 days, patient will work with CM Pharmacist and PCP to address needs related to medication assistance and diabetes management  Interventions: . Counsel patient on importance of blood sugar control and monitoring o Reports taking: - Bydureon 2 mg weekly - Lantus 19 units daily - Metformin ER 750 mg BID o Review recent blood sugars with patient (see below) o Denies any recent low blood sugars o Denies any side effects with metformin dose increase o Confirms receiving the diabetes educational materials and that he has been eating better this week, limiting his portion sizes of carbohydrates o Encourage patient to continue to focus on eating regular and balanced meals . Collaborating with [place] to assist patient with applying for patient assistance for Bydureon through [place] and for Lantus through [employer]  Patient Self Care Activities:  . Self administers medications as prescribed with assistance from his wife o Uses weekly pillbox as adherence tool . Attends all scheduled provider appointments . Calls pharmacy for medication refills . Calls provider office for new concerns or questions . To check blood sugars twice daily and keep log Date Fasting Blood Glucose Before Supper After Supper  5 - September 146 127   6  - September 185 163   7 - September 128 160   8 - September 117 140   9 - September   169  10 - September - 125   11 - September -    Average 144 143     Please see past updates related to this goal by clicking on the "Past Updates" button in the selected goal         The patient verbalized understanding of instructions provided today and declined a print copy of patient instruction materials.   Telephone follow up appointment with care management team member scheduled for: 10/8 at 4 pm  Harlow Asa, PharmD, [place] [phone]

## 2018-12-07 NOTE — Chronic Care Management (AMB) (Signed)
Chronic Care Management   Follow Up Note   12/07/2018 Name: Daniel SanesKenneth Eugene Peloso MRN: [id number] DOB: 02/09/1951  Referred by: Smitty CordsKaramalegos, Alexander J, DO Reason for referral : Chronic Care Management (Patient Phone Call)   [name] is a 68 y.o. year old male who is a primary care patient of Smitty CordsKaramalegos, Alexander J, DO. The CCM team was consulted for assistance with chronic disease management and care coordination needs.   [name] has a past medical history including but not limited to type 2 diabetes mellitus, hypertension, hyperlipidemia, GERD, gout, seasonal allergies and chronic back pain  I reached out to Daniel SanesKenneth Eugene Coddington and his wife by phone today.   Review of patient status, including review of consultants reports, relevant laboratory and other test results, and collaboration with appropriate care team members and the patient's provider was performed as part of comprehensive patient evaluation and provision of chronic care management services.     Outpatient Encounter Medications as of 12/07/2018  Medication Sig Note  . BYDUREON BCISE 2 MG/0.85ML AUIJ INJECT 2 MG INTO THE SKIN ONCE A WEEK. 11/20/2018: On Mondays  . metFORMIN (GLUCOPHAGE-XR) 750 MG 24 hr tablet Take 2 tablets (1,500 mg total) by mouth daily with breakfast. May start with only 1 tab, and increase as tolerated. (Patient taking differently: Take 750 mg by mouth 2 (two) times daily. )   . ACCU-CHEK SMARTVIEW test strip Check sugar 2 times daily   . allopurinol (ZYLOPRIM) 100 MG tablet Take 1 tablet (100 mg total) by mouth at bedtime.   Marland Kitchen. amLODipine (NORVASC) 2.5 MG tablet TAKE 1 TABLET BY MOUTH AT BEDTIME   . colchicine 0.6 MG tablet Take 1 tablet (0.6 mg total) by mouth as needed (gout flare).   . fluticasone (FLONASE) 50 MCG/ACT nasal spray USE 2 SPRAY(S) IN EACH NOSTRIL ONCE DAILY USE FOR 4-6 WEEKS THEN STOP AND USE SEASONALLY OR AS NEEDED   . Insulin Glargine (LANTUS SOLOSTAR) 100 UNIT/ML Solostar Pen  Inject 16 Units into the skin daily at 10 pm. (Patient taking differently: Inject 19 Units into the skin daily at 10 pm. )   . Insulin Pen Needle (NOVOFINE PLUS) 32G X 4 MM MISC Use with Lantus insulin daily injection as instructed   . lisinopril (PRINIVIL,ZESTRIL) 40 MG tablet Take 1 tablet (40 mg total) by mouth daily.   Marland Kitchen. omeprazole (PRILOSEC) 20 MG capsule Take 1 capsule (20 mg total) by mouth daily.    No facility-administered encounter medications on file as of 12/07/2018.     Goals Addressed            This Visit's Progress   . PharmD - "He's in the donut hole" (pt-stated)       Current Barriers:  . Financial Barriers - Patient currently in coverage gap of Part D plan . Lack of blood sugar results for clinical team  Pharmacist Clinical Goal(s):  Marland Kitchen. Over the next 30 days, patient will work with CM Pharmacist and PCP to address needs related to medication assistance and diabetes management  Interventions: . Counsel patient on importance of blood sugar control and monitoring o Reports taking: - Bydureon 2 mg weekly - Lantus 19 units daily - Metformin ER 750 mg BID o Review recent blood sugars with patient (see below) o Denies any recent low blood sugars o Denies any side effects with metformin dose increase o Confirms receiving the diabetes educational materials and that he has been eating better this week, limiting his portion sizes of  carbohydrates o Encourage patient to continue to focus on eating regular and balanced meals . Collaborating with [place] to assist patient with applying for patient assistance for Bydureon through [place] and for Lantus through [employer]  Patient Self Care Activities:  . Self administers medications as prescribed with assistance from his wife o Uses weekly pillbox as adherence tool . Attends all scheduled provider appointments . Calls pharmacy for medication refills . Calls provider office for new concerns or questions . To check blood  sugars twice daily and keep log Date Fasting Blood Glucose Before Supper After Supper  5 - September 146 127   6 - September 185 163   7 - September 128 160   8 - September 117 140   9 - September   169  10 - September - 125   11 - September -    Average 144 143     Please see past updates related to this goal by clicking on the "Past Updates" button in the selected goal         Plan  Telephone follow up appointment with care management team member scheduled for: 10/8 at 4 pm  Harlow Asa, PharmD, [place] [phone]

## 2018-12-10 ENCOUNTER — Other Ambulatory Visit: Payer: Self-pay | Admitting: Pharmacy Technician

## 2018-12-10 NOTE — Patient Outreach (Signed)
[facility]) Care Management  12/10/2018  [name] [birth year] [id number]   Care coordination call placed to [employer] in regards to patient's application for Lantus.  Spoke to [employer] who informed they had received the application but had not processed the application. She advised to check back in 1-3 business days.  Will followup with [employer] in 2-3 business days to inquire on status of aplicaiton.  Shawnee Higham P. [name], [place] Management [phone]

## 2018-12-11 ENCOUNTER — Other Ambulatory Visit: Payer: Self-pay | Admitting: Pharmacy Technician

## 2018-12-11 NOTE — Patient Outreach (Signed)
[facility]) Care Management  12/11/2018  Gleen Ripberger [birth year] [id number]   Received all necessary documents and signatures from both patient and provider for [name] patient assistance for Bydureon.  Submitted completed application via fax to [name].  Will followup with [name] in 5-10 business days to inquire on status of application.  Chaquita Basques P. Cyncere Ruhe, [place] Management [phone]

## 2018-12-12 ENCOUNTER — Other Ambulatory Visit: Payer: Self-pay | Admitting: Pharmacy Technician

## 2018-12-12 NOTE — Patient Outreach (Signed)
[facility]) Care Management  12/12/2018  Nicole Hafley [birth year] [id number]   Care coordination call placed to [employer] in regards to patient 's application for Lantus.  Spoke to [place] who informed the application was still in processing. She informed a decision should be made in the next couple of days.  Will followup with [employer] in 2-3 business days.  Arvetta Araque P. Perris Tripathi, [place] Management [phone]

## 2018-12-14 ENCOUNTER — Other Ambulatory Visit: Payer: Self-pay | Admitting: Pharmacy Technician

## 2018-12-14 NOTE — Patient Outreach (Signed)
[facility]) Care Management  12/14/2018  Dorothy Landgrebe [birth year] [id number]    Care coordination call placed to [employer] in regards to patient's application for Lantus.  Spoke to [place] who informed patient had been APPROVED 12/13/2018-03/28/2019. Jamal informed the shipment had been processed but not shipped as they only ship on M-W. He informed it should ship next week with an expected arrival date at provider's office in 3-5 business.  Will followup with patient in 5-7 business days to confirm receipt of medication.  Leilynn Pilat P. Skylar Priest, [place] Management [phone]

## 2018-12-18 ENCOUNTER — Other Ambulatory Visit: Payer: Self-pay | Admitting: Pharmacy Technician

## 2018-12-18 NOTE — Patient Outreach (Signed)
[facility]) Care Management  12/18/2018  [name] [birth year] [id number]    ADDENDUM  Successful outreach call placed to patient in regards to Lantus application with [employer] and Bydureon application with [name].  Spoke to patient's wife [name], HIPAA identifiers verified.  Informed patient's wife that patient had been approved for both programs until the end of the year 03/28/2019. At that point, patient would need to begin purchasing the medications again until the required OOP is met which is 2% for [employer] and 3% for [name]. Patient's wife verbazlied understanding.  Informed patient's wife that the Lantus should be delivered anytime between now and next week to the provider's office. Patient's wife informed me that the Bydureon , 3 boxes, was delivered to them today. Informed patient's wfie that patient or herself  would need to call both companies of which I provided the phone number to request refills before the end of the year. Informed patient's wife  that [employer] may require her to outreach provider to phone in the refill for the Lantus. Patient's wife verbalized understanding.  Will followup with patient in 3-5 business days to confirm receipt of Lantus.  Tahlia Deamer P. Arminta Gamm, [place] Management [phone]

## 2018-12-18 NOTE — Patient Outreach (Signed)
[facility]) Care Management  12/18/2018  [name] 09/22/1950 [id number]   Care coordination call placed to [name] in regards to Bydureon application.  Spoke to [place] who informed patient had been APPROVED 12/12/2018-03/28/2019. [place] informed 3 boxes or 90 days supply was delivered to the patient's home today.  Will followup with patient regarding this information.  Breuna Loveall P. Sheriece Jefcoat, [place] Management [phone]

## 2018-12-19 ENCOUNTER — Telehealth: Payer: Self-pay

## 2018-12-19 NOTE — Telephone Encounter (Addendum)
I notified the patient wife that his Lantus came in the mail today and is available for pick-up at his convenience.

## 2018-12-19 NOTE — Telephone Encounter (Signed)
Acknowledged.  Nobie Putnam, [place] [facility] 12/19/2018, 2:06 PM

## 2018-12-21 ENCOUNTER — Other Ambulatory Visit: Payer: Self-pay | Admitting: Pharmacy Technician

## 2018-12-21 NOTE — Patient Outreach (Signed)
[facility]) Care Management  12/21/2018  [name] [birth year] [id number]  Successful outreach call placed to patient in regards to [employer] application for Lantus and [name] application for Bydureon.  Spoke to patient's wife [name], HIPAA identifiers verified x 2.  Patient's wife informed they picked up 1 box of Lantus that was delivered to the provider's office yesterday. Discussed refill procedure with patient's wife. Informed her to reach out to provider's office when patient is down to 2-3 week supply of medication so they will have enough time to contact [employer] for the refill an [employer] will have enough time to process and ship the medication. Patient's wife  verbalized understanding.  Discussed refill procedure for the Bydureon through [name] again with patient's wife. Patient's wife verbalized understanding. Patient's wife informed she kept the paperwork that came with each product that gave instructions as well.  Patient's wife denies have any other questions or concerms. Confirmed patient had name and number.  Will route note to embedded Ms [facility] Harlow Asa that patient assistance has been completed and will remove myself from care team.  [name], [place] Management [phone]

## 2018-12-26 DIAGNOSIS — M5416 Radiculopathy, lumbar region: Secondary | ICD-10-CM | POA: Diagnosis not present

## 2018-12-31 ENCOUNTER — Encounter: Payer: Self-pay | Admitting: Nurse Practitioner

## 2018-12-31 ENCOUNTER — Other Ambulatory Visit: Payer: Self-pay

## 2018-12-31 ENCOUNTER — Ambulatory Visit (INDEPENDENT_AMBULATORY_CARE_PROVIDER_SITE_OTHER): Payer: Medicare HMO | Admitting: Nurse Practitioner

## 2018-12-31 DIAGNOSIS — N3001 Acute cystitis with hematuria: Secondary | ICD-10-CM

## 2018-12-31 DIAGNOSIS — R81 Glycosuria: Secondary | ICD-10-CM | POA: Diagnosis not present

## 2018-12-31 DIAGNOSIS — R300 Dysuria: Secondary | ICD-10-CM

## 2018-12-31 LAB — POCT URINALYSIS DIPSTICK
Bilirubin, UA: NEGATIVE
Glucose, UA: POSITIVE — AB
Ketones, UA: NEGATIVE
Nitrite, UA: NEGATIVE
Protein, UA: NEGATIVE
Spec Grav, UA: 1.01 (ref 1.010–1.025)
Urobilinogen, UA: 0.2 E.U./dL
pH, UA: 5 (ref 5.0–8.0)

## 2018-12-31 MED ORDER — CEFDINIR 300 MG PO CAPS: 300.0000 mg | ORAL_CAPSULE | Freq: Two times a day (BID) | ORAL | 0 refills | Status: AC

## 2018-12-31 NOTE — Progress Notes (Signed)
Telemedicine Encounter: Disclosed to patient at start of encounter that we will provide appropriate telemedicine services.  Patient consents to be treated via phone prior to discussion. - Patient is at his home and is accessed via telephone. - Services are provided by Cassell Smiles from [facility].   Subjective:    Patient ID: [name], male    DOB: 08-05-1950, 68 y.o.   MRN: [id number]  [name] is a 68 y.o. male presenting on 12/31/2018 for Abdominal Pain (dysuria, urgency, urinary frequency and increase nocturia x 4 days. Pt notice some improvement with AZO.  THe pt had an injection in his back for lower back pain x 5 days ago.)  HPI Abdominal pain Lower abdomen and across beltline.   Started having pain Fri night, significantly worse Sat.  Went fishing and continued worsening Sat.  Patient took [place] with some improvement.  Patient felt a little warm/flushed, not high fever. This improved Sat pm. Sun continued AZO and seeks care today. - increased frequency - occasional incontinence - No hesitancy  Prednisone injection after injection is always high.  Usually sugars return to normal, but have stayed high with infection symptoms. - Increased Lantus.  Usually takes 18 units.  Patient took extra 10 units Sat.   - Friday night took 20 units at night. - Saturday took 20 units at night. - SUN did 10 during day, 15 at night  Social History   Tobacco Use  . Smoking status: Former Smoker    Types: Cigars    Quit date: 2000    Years since quitting: 20.7  . Smokeless tobacco: Current User    Types: Chew  . Tobacco comment: wife states patient never smoked cigarettes, just chewing tobacco  Substance Use Topics  . Alcohol use: Not Currently    Alcohol/week: 0.0 standard drinks  . Drug use: No    Review of Systems Per HPI unless specifically indicated above     Objective:    There were no vitals taken for this visit.  Wt Readings from Last 3  Encounters:  04/23/18 231 lb (104.8 kg)  01/15/18 233 lb 9.6 oz (106 kg)  10/03/17 236 lb 9.6 oz (107.3 kg)    Physical Exam Patient remotely monitored.  Verbal communication appropriate.  Cognition normal.  Results for orders placed or performed in visit on 12/31/18  POCT Urinalysis Dipstick  Result Value Ref Range   Color, UA dark yellow    Clarity, UA cloudy    Glucose, UA Positive (A) Negative   Bilirubin, UA negative    Ketones, UA negative    Spec Grav, UA 1.010 1.010 - 1.025   Blood, UA large    pH, UA 5.0 5.0 - 8.0   Protein, UA Negative Negative   Urobilinogen, UA 0.2 0.2 or 1.0 E.U./dL   Nitrite, UA negative    Leukocytes, UA Small (1+) (A) Negative   Appearance     Odor        Assessment & Plan:   Problem List Items Addressed This Visit    None    Visit Diagnoses    Acute cystitis with hematuria    -  Primary Acute cystitis with hematuria.  Pt symptomatic currently with increased suprapubic pressure x 4 days. Currently without systemic signs or symptoms of infection.   - No current risk of concurrent STI.  Plan: 1. START cefdinir 300 mg bid x 5 days.   - Send Urine culture 2. Provided non-pharm  measures for UTI prevention for good hygiene. 3. Drink plenty of fluids and improve hydration over next 1 week. 4. Provided precautions for severe symptoms requiring ED visit to include no urine in 24-48 hours. 5. Followup 2-5 days as needed for worsening or persistent symptoms.     Relevant Medications   cefdinir (OMNICEF) 300 MG capsule   Dysuria       Relevant Orders   POCT Urinalysis Dipstick (Completed)   Glucosuria with T2DM Patient self-titrating Lantus insulin to help reduce sugars.  High readings > 400.  No other HHNK symptoms. - Patient instructed to take 24 units Lantus tonight and tomorrow if CBG > 200.  Then, reduce back to 18 units daily per patient report of dosing (med list 16 units prescribed).  Patient verbalizes understanding. - Reviewed signs  and symptoms of hypoglycemia and treatments.  Patient verbalizes understanding.      Meds ordered this encounter  Medications  . cefdinir (OMNICEF) 300 MG capsule    Sig: Take 1 capsule (300 mg total) by mouth 2 (two) times daily for 5 days.    Dispense:  10 capsule    Refill:  0    Order Specific Question:   Supervising Provider    Answer:   [name] [[id number]]    - Time spent in direct consultation with patient via telemedicine about above concerns: 12 minutes  Follow up plan: 5-7 days prn no improvement or worsening.  Wilhelmina Mcardle, DNP, AGPCNP-BC Adult Gerontology Primary Care Nurse Practitioner [facility] 12/31/2018, 10:46 AM

## 2019-01-02 ENCOUNTER — Other Ambulatory Visit: Payer: Self-pay | Admitting: Family Medicine

## 2019-01-02 DIAGNOSIS — I10 Essential (primary) hypertension: Secondary | ICD-10-CM

## 2019-01-02 LAB — URINE CULTURE
MICRO NUMBER:: 955385
SPECIMEN QUALITY:: ADEQUATE

## 2019-01-03 ENCOUNTER — Telehealth: Payer: Self-pay

## 2019-01-03 ENCOUNTER — Ambulatory Visit: Payer: Self-pay | Admitting: Pharmacist

## 2019-01-03 DIAGNOSIS — N3001 Acute cystitis with hematuria: Secondary | ICD-10-CM

## 2019-01-03 NOTE — Patient Instructions (Signed)
Thank you allowing the Chronic Care Management Team to be a part of your care! It was a pleasure speaking with you today!     CCM (Chronic Care Management) Team    Janci Minor RN, BSN Nurse Care Coordinator  [phone]   Harlow Asa PharmD  Clinical Pharmacist  [phone]   Eula Fried LCSW Clinical Social Worker [phone]  Visit Information  Goals Addressed            This Visit's Progress   . PharmD - "He's in the donut hole" (pt-stated)       Current Barriers:  . Financial Barriers - Patient currently in coverage gap of Part D plan . Lack of blood sugar results for clinical team  Pharmacist Clinical Goal(s):  Marland Kitchen Over the next 30 days, patient will work with CM Pharmacist and PCP to address needs related to medication assistance and diabetes management  Interventions: . Follow up with Mrs. Durnell, as patient not currently home o Mrs. [place]ind[place] confirms patient has been taking cefdinir as directed and states he has been feeling better.  o Encourage that patient complete course and stay hydrated . Collaborated with [facility] C[facility] to assist patient with applying for patient assistance for Bydureon through [employer] and for Lantus through [employer] o Per note from [place], patient has been approved for assistance for Lantus and Bydureon, received medication and Mrs. Ernsberger counseled on refill procedures.  Patient Self Care Activities:  . Self administers medications as prescribed with assistance from his wife o Uses weekly pillbox as adherence tool . Attends all scheduled provider appointments . Calls pharmacy for medication refills . Calls provider office for new concerns or questions . To check blood sugars twice daily and keep log  Please see past updates related to this goal by clicking on the "Past Updates" button in the selected goal         The patient verbalized understanding of instructions provided today and declined a print copy of patient instruction  materials.   Telephone follow up appointment with care management team member scheduled for: 10/14 at 4 pm  Harlow Asa, PharmD, [place] [phone]

## 2019-01-03 NOTE — Chronic Care Management (AMB) (Signed)
Chronic Care Management   Follow Up Note   01/03/2019 Name: [name] MRN: [id number] DOB: December 01, 1950  Referred by: [name], DO Reason for referral : Chronic Care Management (Patient Phone Call)   [name] is a 68 y.o. year old male who is a primary care patient of [name], DO. The CCM team was consulted for assistance with chronic disease management and care coordination needs.  [name] has a past medical history including but not limited to type 2 diabetes mellitus, hypertension, hyperlipidemia, GERD, gout, seasonal allergies and chronic back pain.  I reached out to [name] by phone today. Patient is not home, but speak with [name].  Review of patient status, including review of consultants reports, relevant laboratory and other test results, and collaboration with appropriate care team members and the patient's provider was performed as part of comprehensive patient evaluation and provision of chronic care management services.     Outpatient Encounter Medications as of 01/03/2019  Medication Sig Note  . cefdinir (OMNICEF) 300 MG capsule Take 1 capsule (300 mg total) by mouth 2 (two) times daily for 5 days.   Marland Kitchen ACCU-CHEK SMARTVIEW test strip Check sugar 2 times daily   . allopurinol (ZYLOPRIM) 100 MG tablet Take 1 tablet (100 mg total) by mouth at bedtime.   Marland Kitchen amLODipine (NORVASC) 2.5 MG tablet TAKE 1 TABLET BY MOUTH AT BEDTIME   . BYDUREON BCISE 2 MG/0.85ML AUIJ INJECT 2 MG INTO THE SKIN ONCE A WEEK. 11/20/2018: On Mondays  . colchicine 0.6 MG tablet Take 1 tablet (0.6 mg total) by mouth as needed (gout flare).   . fluticasone (FLONASE) 50 MCG/ACT nasal spray USE 2 SPRAY(S) IN EACH NOSTRIL ONCE DAILY USE FOR 4-6 WEEKS THEN STOP AND USE SEASONALLY OR AS NEEDED   . Insulin Glargine (LANTUS SOLOSTAR) 100 UNIT/ML Solostar Pen Inject 16 Units into the skin daily at 10 pm. (Patient taking differently: Inject 20 Units into the skin  daily at 10 pm. )   . Insulin Pen Needle (NOVOFINE PLUS) 32G X 4 MM MISC Use with Lantus insulin daily injection as instructed   . lisinopril (PRINIVIL,ZESTRIL) 40 MG tablet Take 1 tablet (40 mg total) by mouth daily.   . metFORMIN (GLUCOPHAGE-XR) 750 MG 24 hr tablet Take 2 tablets (1,500 mg total) by mouth daily with breakfast. May start with only 1 tab, and increase as tolerated. (Patient taking differently: Take 750 mg by mouth 2 (two) times daily. )   . omeprazole (PRILOSEC) 20 MG capsule Take 1 capsule (20 mg total) by mouth daily.    No facility-administered encounter medications on file as of 01/03/2019.     Goals Addressed            This Visit's Progress   . PharmD - "He's in the donut hole" (pt-stated)       Current Barriers:  . Financial Barriers - Patient currently in coverage gap of Part D plan . Lack of blood sugar results for clinical team  Pharmacist Clinical Goal(s):  Marland Kitchen Over the next 30 days, patient will work with CM Pharmacist and PCP to address needs related to medication assistance and diabetes management  Interventions: . Perform chart review o Patient started on cefdinir 300 mg twice daily for 5 days for Acute cystitis with hematuria on 10/5 o Urine culture positive for E. Coli, susceptible to third generation cephalosporin . Follow up with [name], as patient not currently home o [name]  confirms patient has been taking cefdinir as directed and states he has been feeling better.  o Encourage that patient complete course and stay hydrated . Collaborated with [facility] to assist patient with applying for patient assistance for Bydureon through [employer] and for Lantus through [employer] o Per note from [name], patient has been approved for assistance for Lantus and Bydureon, received medication and [name] counseled on refill procedures.  Patient Self Care Activities:  . Self administers medications as prescribed with assistance from his wife o Uses weekly  pillbox as adherence tool . Attends all scheduled provider appointments . Calls pharmacy for medication refills . Calls provider office for new concerns or questions . To check blood sugars twice daily and keep log  Please see past updates related to this goal by clicking on the "Past Updates" button in the selected goal         Plan  Telephone follow up appointment with care management team member scheduled for: 10/14 at 4 pm  Harlow Asa, PharmD, [place] [phone]

## 2019-01-09 ENCOUNTER — Telehealth: Payer: Self-pay | Admitting: Family Medicine

## 2019-01-09 ENCOUNTER — Ambulatory Visit: Payer: Medicare HMO | Admitting: Pharmacist

## 2019-01-09 DIAGNOSIS — Z794 Long term (current) use of insulin: Secondary | ICD-10-CM

## 2019-01-09 DIAGNOSIS — E1121 Type 2 diabetes mellitus with diabetic nephropathy: Secondary | ICD-10-CM

## 2019-01-09 DIAGNOSIS — N3001 Acute cystitis with hematuria: Secondary | ICD-10-CM

## 2019-01-09 NOTE — Telephone Encounter (Signed)
Pt called said that he needed more medication he think his UTI have come back

## 2019-01-09 NOTE — Telephone Encounter (Signed)
Do you mind calling patient back for more information? And then routing to [place] for her thoughts after you get more information? She saw him while covering for me on 12/31/18.  Nobie Putnam, DO [facility] 01/09/2019, 3:34 PM

## 2019-01-09 NOTE — Patient Instructions (Signed)
Thank you allowing the Chronic Care Management Team to be a part of your care! It was a pleasure speaking with you today!     CCM (Chronic Care Management) Team    Janci Minor RN, BSN Nurse Care Coordinator  [phone]   Harlow Asa PharmD  Clinical Pharmacist  [phone]   Eula Fried LCSW Clinical Social Worker [phone]  Visit Information  Goals Addressed            This Visit's Progress   . PharmD - "He's in the donut hole" (pt-stated)       Current Barriers:  . Financial Barriers - Patient currently in coverage gap of Part D plan . Lack of blood sugar results for clinical team  Pharmacist Clinical Goal(s):  Marland Kitchen Over the next 30 days, patient will work with CM Pharmacist and PCP to address needs related to medication assistance and diabetes management  Interventions: . Perform chart review o Patient called clinic this afternoon stating that he needed more medication he think his UTI have come back . Follow up with Mr. Signorelli regarding his urinary symptoms o Mr. Perret reports he completed the course of cefdinir 300 mg twice daily for 5 days as prescribed by NP on 10/5. o Reports that his symptoms had resolved after completing course until started to have burning with urination starting last night, which has continued throughout today. - Denies feeling of urgency or increased frequency of urination today o Reports that he also had bloating and upset stomach this week, starting 3 days ago, which he had attributed to his metformin and stopped taking the evening dose of his metformin for the past few nights - Reports blood sugar today ~1-2 hours post-lunch of 218 mg/dL . Will collaborate with NP Cassell Smiles regarding patient's urinary symptoms today.  Patient Self Care Activities:  . Self administers medications as prescribed with assistance from his wife o Uses weekly pillbox as adherence tool . Attends all scheduled provider appointments . Calls pharmacy  for medication refills . Calls provider office for new concerns or questions . To check blood sugars twice daily and keep log  Please see past updates related to this goal by clicking on the "Past Updates" button in the selected goal         The patient verbalized understanding of instructions provided today and declined a print copy of patient instruction materials.   Follow up with provider re: urinary symptoms  Harlow Asa, PharmD, [place] [phone]

## 2019-01-09 NOTE — Chronic Care Management (AMB) (Signed)
Chronic Care Management   Follow Up Note   01/09/2019 Name: [name] MRN: [id number] DOB: 1950/05/21  Referred by: [name], DO Reason for referral : Chronic Care Management (Patient Phone Call)   [name] is a 68 y.o. year old male who is a primary care patient of [name], DO. The CCM team was consulted for assistance with chronic disease management and care coordination needs.  [name] has a past medical history including but not limited to type 2 diabetes mellitus, hypertension, hyperlipidemia, GERD, gout, seasonal allergies and chronic back pain.  I reached out to [name] by phone today.   Review of patient status, including review of consultants reports, relevant laboratory and other test results, and collaboration with appropriate care team members and the patient's provider was performed as part of comprehensive patient evaluation and provision of chronic care management services.    Outpatient Encounter Medications as of 01/09/2019  Medication Sig Note  . ACCU-CHEK SMARTVIEW test strip Check sugar 2 times daily   . allopurinol (ZYLOPRIM) 100 MG tablet Take 1 tablet (100 mg total) by mouth at bedtime.   Marland Kitchen amLODipine (NORVASC) 2.5 MG tablet TAKE 1 TABLET BY MOUTH AT BEDTIME   . BYDUREON BCISE 2 MG/0.85ML AUIJ INJECT 2 MG INTO THE SKIN ONCE A WEEK. 11/20/2018: On Mondays  . colchicine 0.6 MG tablet Take 1 tablet (0.6 mg total) by mouth as needed (gout flare).   . fluticasone (FLONASE) 50 MCG/ACT nasal spray USE 2 SPRAY(S) IN EACH NOSTRIL ONCE DAILY USE FOR 4-6 WEEKS THEN STOP AND USE SEASONALLY OR AS NEEDED   . Insulin Glargine (LANTUS SOLOSTAR) 100 UNIT/ML Solostar Pen Inject 16 Units into the skin daily at 10 pm. (Patient taking differently: Inject 20 Units into the skin daily at 10 pm. )   . Insulin Pen Needle (NOVOFINE PLUS) 32G X 4 MM MISC Use with Lantus insulin daily injection as instructed   . lisinopril  (PRINIVIL,ZESTRIL) 40 MG tablet Take 1 tablet (40 mg total) by mouth daily.   . metFORMIN (GLUCOPHAGE-XR) 750 MG 24 hr tablet Take 2 tablets (1,500 mg total) by mouth daily with breakfast. May start with only 1 tab, and increase as tolerated. (Patient taking differently: Take 750 mg by mouth 2 (two) times daily. )   . omeprazole (PRILOSEC) 20 MG capsule Take 1 capsule (20 mg total) by mouth daily.    No facility-administered encounter medications on file as of 01/09/2019.     Goals Addressed            This Visit's Progress   . PharmD - "He's in the donut hole" (pt-stated)       Current Barriers:  . Financial Barriers - Patient currently in coverage gap of Part D plan . Lack of blood sugar results for clinical team  Pharmacist Clinical Goal(s):  Marland Kitchen Over the next 30 days, patient will work with CM Pharmacist and PCP to address needs related to medication assistance and diabetes management  Interventions: . Perform chart review o Patient called clinic this afternoon stating that he "needed more medication he think his UTI have come back" . Follow up with [name] regarding his urinary symptoms o [name] reports he completed the course of cefdinir 300 mg twice daily for 5 days as prescribed by NP on 10/5. o Reports that his symptoms had resolved after completing course until started to have burning with urination starting last night, which has continued throughout today. -  Denies feeling of urgency or increased frequency of urination today o Reports that he also had bloating and upset stomach this week, starting 3 days ago, which he had attributed to his metformin and stopped taking the evening dose of his metformin for the past few nights - Reports blood sugar today ~1-2 hours post-lunch of 218 mg/dL . Will collaborate with NP [name] regarding patient's urinary symptoms today.  Patient Self Care Activities:  . Self administers medications as prescribed with assistance from his  wife o Uses weekly pillbox as adherence tool . Attends all scheduled provider appointments . Calls pharmacy for medication refills . Calls provider office for new concerns or questions . To check blood sugars twice daily and keep log  Please see past updates related to this goal by clicking on the "Past Updates" button in the selected goal         Plan  Follow up with provider re: patient's urinary symptoms  Harlow Asa, PharmD, [place] [phone]

## 2019-01-10 ENCOUNTER — Telehealth: Payer: Self-pay | Admitting: Nurse Practitioner

## 2019-01-10 ENCOUNTER — Other Ambulatory Visit: Payer: Self-pay | Admitting: Nurse Practitioner

## 2019-01-10 ENCOUNTER — Ambulatory Visit (INDEPENDENT_AMBULATORY_CARE_PROVIDER_SITE_OTHER): Payer: Medicare HMO | Admitting: Pharmacist

## 2019-01-10 DIAGNOSIS — Z794 Long term (current) use of insulin: Secondary | ICD-10-CM

## 2019-01-10 DIAGNOSIS — N3001 Acute cystitis with hematuria: Secondary | ICD-10-CM

## 2019-01-10 DIAGNOSIS — M4316 Spondylolisthesis, lumbar region: Secondary | ICD-10-CM | POA: Diagnosis not present

## 2019-01-10 DIAGNOSIS — E119 Type 2 diabetes mellitus without complications: Secondary | ICD-10-CM | POA: Diagnosis not present

## 2019-01-10 DIAGNOSIS — M48062 Spinal stenosis, lumbar region with neurogenic claudication: Secondary | ICD-10-CM | POA: Diagnosis not present

## 2019-01-10 DIAGNOSIS — E1121 Type 2 diabetes mellitus with diabetic nephropathy: Secondary | ICD-10-CM | POA: Diagnosis not present

## 2019-01-10 DIAGNOSIS — Z01818 Encounter for other preprocedural examination: Secondary | ICD-10-CM | POA: Diagnosis not present

## 2019-01-10 MED ORDER — CEFDINIR 300 MG PO CAPS: 300.0000 mg | ORAL_CAPSULE | Freq: Two times a day (BID) | ORAL | 0 refills | Status: AC

## 2019-01-10 NOTE — Telephone Encounter (Signed)
Please also confirm the dosing as twice daily.  It seems he may be taking only daily based on Nisha's call today.

## 2019-01-10 NOTE — Patient Instructions (Signed)
Thank you allowing the Chronic Care Management Team to be a part of your care! It was a pleasure speaking with you today!     CCM (Chronic Care Management) Team    Janci Minor RN, BSN Nurse Care Coordinator  [phone]   Harlow Asa PharmD  Clinical Pharmacist  [phone]   Eula Fried LCSW Clinical Social Worker [phone]  Visit Information  Goals Addressed            This Visit's Progress   . PharmD - "He's in the donut hole" (pt-stated)       Current Barriers:  . Financial Barriers - Patient currently in coverage gap of Part D plan . Lack of blood sugar results for clinical team  Pharmacist Clinical Goal(s):  Marland Kitchen Over the next 30 days, patient will work with CM Pharmacist and PCP to address needs related to medication assistance and diabetes management  Interventions: . Collaborate with NP Hezzie Bump advises that patient is to repeat cefdinir 300 mg twice daily for a 7 day course . Follow up with Mr. Mcdill regarding his urinary symptoms o Counsel patient that NP called a new prescription for cefdinir into his [place] and that he should take this second course of cefdinir 300 mg twice daily for a full 7 days. - Patient verbalizes understanding via teach back method o As previously reviewed, note that urine culture was positive for E. Coli, susceptible to third generation cephalosporin. . Encourage patient to stay hydrated . Counsel patient on the importance of blood sugar control and monitoring o Mr. Creppel denies having checked his blood sugar today o Encourage patient to resume checking and maintaining log o Discuss importance of portion control, limiting carbohydrates and dietary choices  Patient Self Care Activities:  . Self administers medications as prescribed with assistance from his wife o Uses weekly pillbox as adherence tool . Attends all scheduled provider appointments . Calls pharmacy for medication refills . Calls  provider office for new concerns or questions . To check blood sugars twice daily and keep log  Please see past updates related to this goal by clicking on the "Past Updates" button in the selected goal         The patient verbalized understanding of instructions provided today and declined a print copy of patient instruction materials.   Telephone follow up appointment with care management team member scheduled for: 10/27 at 3:30 pm  Harlow Asa, PharmD, [place] [phone]

## 2019-01-10 NOTE — Telephone Encounter (Signed)
Left message for patient to call back  

## 2019-01-10 NOTE — Chronic Care Management (AMB) (Signed)
Chronic Care Management   Follow Up Note   01/10/2019 Name: [name] MRN: [id number] DOB: 01-18-1951  Referred by: [name], DO Reason for referral : Chronic Care Management (Patient Phone Call)   [name] is a 68 y.o. year old male who is a primary care patient of [name], DO. The CCM team was consulted for assistance with chronic disease management and care coordination needs.  [name] has a past medical history including but not limited to type 2 diabetes mellitus, hypertension, hyperlipidemia, GERD, gout, seasonal allergies and chronic back pain.  I reached out to [name] by phone today.   Review of patient status, including review of consultants reports, relevant laboratory and other test results, and collaboration with appropriate care team members and the patient's provider was performed as part of comprehensive patient evaluation and provision of chronic care management services.     Outpatient Encounter Medications as of 01/10/2019  Medication Sig Note  . ACCU-CHEK SMARTVIEW test strip Check sugar 2 times daily   . allopurinol (ZYLOPRIM) 100 MG tablet Take 1 tablet (100 mg total) by mouth at bedtime.   Marland Kitchen amLODipine (NORVASC) 2.5 MG tablet TAKE 1 TABLET BY MOUTH AT BEDTIME   . BYDUREON BCISE 2 MG/0.85ML AUIJ INJECT 2 MG INTO THE SKIN ONCE A WEEK. 11/20/2018: On Mondays  . cefdinir (OMNICEF) 300 MG capsule Take 1 capsule (300 mg total) by mouth 2 (two) times daily for 7 days.   . colchicine 0.6 MG tablet Take 1 tablet (0.6 mg total) by mouth as needed (gout flare).   . fluticasone (FLONASE) 50 MCG/ACT nasal spray USE 2 SPRAY(S) IN EACH NOSTRIL ONCE DAILY USE FOR 4-6 WEEKS THEN STOP AND USE SEASONALLY OR AS NEEDED   . Insulin Glargine (LANTUS SOLOSTAR) 100 UNIT/ML Solostar Pen Inject 16 Units into the skin daily at 10 pm. (Patient taking differently: Inject 20 Units into the skin daily at 10 pm. )   . Insulin Pen Needle  (NOVOFINE PLUS) 32G X 4 MM MISC Use with Lantus insulin daily injection as instructed   . lisinopril (PRINIVIL,ZESTRIL) 40 MG tablet Take 1 tablet (40 mg total) by mouth daily.   . metFORMIN (GLUCOPHAGE-XR) 750 MG 24 hr tablet Take 2 tablets (1,500 mg total) by mouth daily with breakfast. May start with only 1 tab, and increase as tolerated. (Patient taking differently: Take 750 mg by mouth 2 (two) times daily. )   . omeprazole (PRILOSEC) 20 MG capsule Take 1 capsule (20 mg total) by mouth daily.    No facility-administered encounter medications on file as of 01/10/2019.     Goals Addressed            This Visit's Progress   . PharmD - "He's in the donut hole" (pt-stated)       Current Barriers:  . Financial Barriers - Patient currently in coverage gap of Part D plan . Lack of blood sugar results for clinical team  Pharmacist Clinical Goal(s):  Marland Kitchen Over the next 30 days, patient will work with CM Pharmacist and PCP to address needs related to medication assistance and diabetes management  Interventions: . Collaborate with NP Gildardo Cranker advises that patient is to repeat cefdinir 300 mg twice daily for a 7 day course . Follow up with [name] regarding his urinary symptoms o Counsel patient that NP called a new prescription for cefdinir into his [facility] pharmacy and that he should take this second course  of cefdinir 300 mg twice daily for a full 7 days. - Patient verbalizes understanding via teach back method o As previously reviewed, note that urine culture was positive for E. Coli, susceptible to third generation cephalosporin. . Encourage patient to stay hydrated . Counsel patient on the importance of blood sugar control and monitoring o [name] denies having checked his blood sugar today o Encourage patient to resume checking and maintaining log o Discuss importance of portion control, limiting carbohydrates and dietary choices  Patient Self Care Activities:  . Self  administers medications as prescribed with assistance from his wife o Uses weekly pillbox as adherence tool . Attends all scheduled provider appointments . Calls pharmacy for medication refills . Calls provider office for new concerns or questions . To check blood sugars twice daily and keep log  Please see past updates related to this goal by clicking on the "Past Updates" button in the selected goal         Plan  Telephone follow up appointment with care management team member scheduled for: 10/27 at 3:30 pm to follow up regarding diabetes management  Harlow Asa, PharmD, [place] [phone]

## 2019-01-10 NOTE — Telephone Encounter (Signed)
Per Grayland Ormond Dhalla's review with patient, recurrence of UTI symptoms.  Review of Culture and patient with Dr. [name].  Decision to resume cefdinir bid x 7 days.  Then, would consider urology referral in future if recurrent symptoms.

## 2019-01-10 NOTE — Telephone Encounter (Signed)
Patient states he is about to run out his antibiotics and still has symptoms. [name] has send you the message.

## 2019-01-11 LAB — CBC AND DIFFERENTIAL
HCT: 40 — AB (ref 41–53)
Hemoglobin: 13.2 — AB (ref 13.5–17.5)
Neutrophils Absolute: 11
Platelets: 328 (ref 150–399)
WBC: 13.9

## 2019-01-11 LAB — HEMOGLOBIN A1C: Hemoglobin A1C: 7.8

## 2019-01-11 LAB — ALBUMIN: Albumin: 4

## 2019-01-15 ENCOUNTER — Encounter: Payer: Self-pay | Admitting: Family Medicine

## 2019-01-17 DIAGNOSIS — M48062 Spinal stenosis, lumbar region with neurogenic claudication: Secondary | ICD-10-CM | POA: Diagnosis not present

## 2019-01-22 ENCOUNTER — Ambulatory Visit: Payer: Self-pay | Admitting: Pharmacist

## 2019-01-22 DIAGNOSIS — K219 Gastro-esophageal reflux disease without esophagitis: Secondary | ICD-10-CM

## 2019-01-22 DIAGNOSIS — Z794 Long term (current) use of insulin: Secondary | ICD-10-CM

## 2019-01-22 DIAGNOSIS — E1121 Type 2 diabetes mellitus with diabetic nephropathy: Secondary | ICD-10-CM

## 2019-01-22 NOTE — Patient Instructions (Signed)
Thank you allowing the Chronic Care Management Team to be a part of your care! It was a pleasure speaking with you today!     CCM (Chronic Care Management) Team    Janci Minor RN, BSN Nurse Care Coordinator  [phone]   Harlow Asa PharmD  Clinical Pharmacist  [phone]   Eula Fried LCSW Clinical Social Worker [phone]  Visit Information  Goals Addressed            This Visit's Progress   . PharmD - "He's in the donut hole" (pt-stated)       Current Barriers:  . Financial Barriers - Patient currently in coverage gap of Part D plan . Lack of blood sugar results for clinical team  Pharmacist Clinical Goal(s):  Marland Kitchen Over the next 30 days, patient will work with CM Pharmacist and PCP to address needs related to medication assistance and diabetes management  Interventions: . Perform chart review . Follow up with Mr. Leamer regarding his urinary symptoms o Confirms completed second course of cefdinir as prescribed. Reports urinary tract infection symptoms resolved. However, continued to have limited appetite/feel bloated, but that this has since improved with taking Miralax and Tums as needed. - Encourage patient to call PCP if symptoms return  Counsel patient on importance of blood sugar control and monitoring ? Reports taking:  Bydureon 2 mg weekly  Lantus 17 units daily  Metformin ER 750 mg BID  Resumed evening dose ~1 week ago ? Review recent blood sugars with patient (see below) ? Denies any recent low blood sugars ? Encourage patient to continue to focus on eating regular and balanced meals  Patient Self Care Activities:  . Self administers medications as prescribed with assistance from his wife o Uses weekly pillbox as adherence tool . Attends all scheduled provider appointments . Calls pharmacy for medication refills . Calls provider office for new concerns or questions . To check blood sugars twice daily and keep log Date Fasting Blood  Glucose Before Supper After Supper Notes  20 - October 115  147   21 - October 120 127    22 - October 98  193* *Reports eating large portion sizes this day  23 - October -  145   24 - October 134 142    25 - October 144  -   26 - October - - 179   27 - October 135     Average 124       Please see past updates related to this goal by clicking on the "Past Updates" button in the selected goal         The patient verbalized understanding of instructions provided today and declined a print copy of patient instruction materials.   The care management team will reach out to the patient again over the next 30 days.   Harlow Asa, PharmD, [place] [employer] [phone]

## 2019-01-22 NOTE — Chronic Care Management (AMB) (Signed)
Chronic Care Management   Follow Up Note   01/22/2019 Name: [name] MRN: [id number] DOB: [birth year]  Referred by: [name], DO Reason for referral : Chronic Care Management (Patient Phone Call)   [name] is a 68 y.o. year old male who is a primary care patient of [name], DO. The CCM team was consulted for assistance with chronic disease management and care coordination needs.  [name] has a past medical history including but not limited to type 2 diabetes mellitus, hypertension, hyperlipidemia, GERD, gout, seasonal allergies and chronic back pain.  I reached out to [name] by phone today.   Review of patient status, including review of consultants reports, relevant laboratory and other test results, and collaboration with appropriate care team members and the patient's provider was performed as part of comprehensive patient evaluation and provision of chronic care management services.     Outpatient Encounter Medications as of 01/22/2019  Medication Sig Note  . BYDUREON BCISE 2 MG/0.85ML AUIJ INJECT 2 MG INTO THE SKIN ONCE A WEEK. 11/20/2018: On Mondays  . calcium carbonate (TUMS - DOSED IN MG ELEMENTAL CALCIUM) 500 MG chewable tablet Chew 2 tablets by mouth at bedtime as needed for indigestion or heartburn.    . Insulin Glargine (LANTUS SOLOSTAR) 100 UNIT/ML Solostar Pen Inject 16 Units into the skin daily at 10 pm. (Patient taking differently: Inject 17 Units into the skin daily at 10 pm. )   . metFORMIN (GLUCOPHAGE-XR) 750 MG 24 hr tablet Take 2 tablets (1,500 mg total) by mouth daily with breakfast. May start with only 1 tab, and increase as tolerated. (Patient taking differently: Take 750 mg by mouth 2 (two) times daily. )   . omeprazole (PRILOSEC) 20 MG capsule Take 1 capsule (20 mg total) by mouth daily.   Marland Kitchen ACCU-CHEK SMARTVIEW test strip Check sugar 2 times daily   . allopurinol (ZYLOPRIM) 100 MG tablet Take 1 tablet  (100 mg total) by mouth at bedtime.   Marland Kitchen amLODipine (NORVASC) 2.5 MG tablet TAKE 1 TABLET BY MOUTH AT BEDTIME   . colchicine 0.6 MG tablet Take 1 tablet (0.6 mg total) by mouth as needed (gout flare).   . fluticasone (FLONASE) 50 MCG/ACT nasal spray USE 2 SPRAY(S) IN EACH NOSTRIL ONCE DAILY USE FOR 4-6 WEEKS THEN STOP AND USE SEASONALLY OR AS NEEDED   . Insulin Pen Needle (NOVOFINE PLUS) 32G X 4 MM MISC Use with Lantus insulin daily injection as instructed   . lisinopril (PRINIVIL,ZESTRIL) 40 MG tablet Take 1 tablet (40 mg total) by mouth daily.    No facility-administered encounter medications on file as of 01/22/2019.     Goals Addressed            This Visit's Progress   . PharmD - "[name]'s in the donut hole" (pt-stated)       Current Barriers:  . Financial Barriers - Patient currently in coverage gap of Part D plan . Lack of blood sugar results for clinical team  Pharmacist Clinical Goal(s):  Marland Kitchen Over the next 30 days, patient will work with CM Pharmacist and PCP to address needs related to medication assistance and diabetes management  Interventions: . Perform chart review o Per 10/15 note from [facility], patient wishing to proceed with surgery for lumbar stenosis and stable degenerative spondylolisthesis. Per Dr. Waymon Budge "in order to proceed with surgery, [name] first will have to be off of antibiotics for 2 weeks for his urinary tract infection.  We will also need to obtain an updated hemoglobin A1c to confirm that it is 7.5 or below." Patient to return for further evaluation once completed. o Latest A1C on 01/11/19: 7.8% . Follow up with [name] regarding his urinary symptoms o Confirms completed second course of cefdinir as prescribed. Reports urinary tract infection symptoms resolved. However, continued to have limited appetite/feel bloated, but that this has since improved with taking Miralax and Tums as needed. - Encourage patient to call PCP if symptoms return  Counsel patient on  importance of blood sugar control and monitoring ? Reports taking:  Bydureon 2 mg weekly  Lantus 17 units daily  Metformin ER 750 mg BID  Resumed evening dose ~1 week ago ? Review recent blood sugars with patient (see below) ? Denies any recent low blood sugars ? Encourage patient to continue to focus on eating regular and balanced meals  Patient Self Care Activities:  . Self administers medications as prescribed with assistance from his wife o Uses weekly pillbox as adherence tool . Attends all scheduled provider appointments . Calls pharmacy for medication refills . Calls provider office for new concerns or questions . To check blood sugars twice daily and keep log Date Fasting Blood Glucose Before Supper After Supper Notes  20 - October 115  147   21 - October 120 127    22 - October 98  193* *Reports eating large portion sizes this day  23 - October -  145   24 - October 134 142    25 - October 144  -   26 - October - - 179   27 - October 135     Average 124       Please see past updates related to this goal by clicking on the "Past Updates" button in the selected goal         Plan  The care management team will reach out to the patient again over the next 30 days.   Harlow Asa, PharmD, [place] [employer] [phone]

## 2019-01-24 DIAGNOSIS — M4316 Spondylolisthesis, lumbar region: Secondary | ICD-10-CM | POA: Diagnosis not present

## 2019-01-24 DIAGNOSIS — M48062 Spinal stenosis, lumbar region with neurogenic claudication: Secondary | ICD-10-CM | POA: Diagnosis not present

## 2019-01-25 ENCOUNTER — Encounter: Payer: Self-pay | Admitting: Physician Assistant

## 2019-01-25 ENCOUNTER — Ambulatory Visit (INDEPENDENT_AMBULATORY_CARE_PROVIDER_SITE_OTHER): Payer: Medicare HMO | Admitting: Physician Assistant

## 2019-01-25 ENCOUNTER — Other Ambulatory Visit: Payer: Self-pay

## 2019-01-25 VITALS — BP 120/66 | HR 79 | Temp 98.9°F | Ht 71.0 in | Wt 227.6 lb

## 2019-01-25 DIAGNOSIS — R300 Dysuria: Secondary | ICD-10-CM | POA: Diagnosis not present

## 2019-01-25 DIAGNOSIS — N3090 Cystitis, unspecified without hematuria: Secondary | ICD-10-CM | POA: Diagnosis not present

## 2019-01-25 LAB — POCT URINALYSIS DIPSTICK
Bilirubin, UA: NEGATIVE
Glucose, UA: NEGATIVE
Ketones, UA: NEGATIVE
Nitrite, UA: NEGATIVE
Protein, UA: NEGATIVE
Spec Grav, UA: 1.015 (ref 1.010–1.025)
Urobilinogen, UA: 0.2 E.U./dL
pH, UA: 5 (ref 5.0–8.0)

## 2019-01-25 MED ORDER — CIPROFLOXACIN HCL 500 MG PO TABS: 500.0000 mg | ORAL_TABLET | Freq: Two times a day (BID) | ORAL | 0 refills | Status: AC

## 2019-01-25 NOTE — Progress Notes (Signed)
Subjective:    Patient ID: [name], male    DOB: 10-Apr-1950, 68 y.o.   MRN: [id number]  [name] is a 68 y.o. male presenting on 01/25/2019 for Urinary Tract Infection (pt state symtptoms started 3 weeks ago he was treated and symptoms subsided but returned 3 days later. He got a second round of antibiotic and symtpms subsided and then return again.)   HPI   Patient is a 68 y/o man presenting today with dysuria x several days. He was treated for cystitis on 12/31/2018 with cefdinir 500 mg BID x 5 days. Urine culture showed E. Coli sensitive to the same. He ended up refilling this for 7 more days and ultimately completed 12 days total. This helped him to feel better and [place] Had an episode of incontinence yesterday. He reports the dysuria has returned. He has had some nausea.    Social History   Tobacco Use  . Smoking status: Former Smoker    Types: Cigars    Quit date: 2000    Years since quitting: 20.8  . Smokeless tobacco: Current User    Types: Chew  . Tobacco comment: wife states patient never smoked cigarettes, just chewing tobacco  Substance Use Topics  . Alcohol use: Not Currently    Alcohol/week: 0.0 standard drinks  . Drug use: No    Review of Systems Per HPI unless specifically indicated above     Objective:    BP 120/66 (BP Location: Right Arm, Patient Position: Sitting, Cuff Size: Normal)   Pulse 79   Temp 98.9 F (37.2 C) (Oral)   Ht 5\' 11"  (1.803 m)   Wt 227 lb 9.6 oz (103.2 kg)   BMI 31.74 kg/m   Wt Readings from Last 3 Encounters:  01/25/19 227 lb 9.6 oz (103.2 kg)  04/23/18 231 lb (104.8 kg)  01/15/18 233 lb 9.6 oz (106 kg)    Physical Exam Constitutional:      General: He is not in acute distress.    Appearance: Normal appearance. He is not ill-appearing.  Cardiovascular:     Rate and Rhythm: Normal rate and regular rhythm.     Heart sounds: Normal heart sounds.  Pulmonary:     Effort:  Pulmonary effort is normal.     Breath sounds: Normal breath sounds.  Abdominal:     General: Bowel sounds are normal.     Palpations: Abdomen is soft.     Tenderness: There is no right CVA tenderness or left CVA tenderness.  Skin:    General: Skin is warm and dry.  Neurological:     Mental Status: He is alert and oriented to person, place, and time. Mental status is at baseline.  Psychiatric:        Mood and Affect: Mood normal.        Behavior: Behavior normal.    Results for orders placed or performed in visit on 01/25/19  POCT Urinalysis Dipstick  Result Value Ref Range   Color, UA yellow    Clarity, UA cloudy    Glucose, UA Negative Negative   Bilirubin, UA negative    Ketones, UA negative    Spec Grav, UA 1.015 1.010 - 1.025   Blood, UA large    pH, UA 5.0 5.0 - 8.0   Protein, UA Negative Negative   Urobilinogen, UA 0.2 0.2 or 1.0 E.U./dL   Nitrite, UA negative    Leukocytes, UA Large (3+) (  A) Negative   Appearance     Odor        Assessment & Plan:  1. Cystitis  Urine dipstick with large blood and leuks. Will culture. Treat with cipro based on prior culture. May consider urology referral if recurrent.   - ciprofloxacin (CIPRO) 500 MG tablet; Take 1 tablet (500 mg total) by mouth 2 (two) times daily for 7 days.  Dispense: 14 tablet; Refill: 0 - CULTURE, URINE COMPREHENSIVE  2. Dysuria  - POCT Urinalysis Dipstick  Follow up plan: Return if symptoms worsen or fail to improve.  Carles Collet, PA-C [facility] 01/25/2019, 3:10 PM

## 2019-01-25 NOTE — Patient Instructions (Signed)

## 2019-01-27 LAB — CULTURE, URINE COMPREHENSIVE
MICRO NUMBER:: 1049276
SPECIMEN QUALITY:: ADEQUATE

## 2019-01-28 ENCOUNTER — Other Ambulatory Visit: Payer: Self-pay | Admitting: Family Medicine

## 2019-01-28 DIAGNOSIS — N390 Urinary tract infection, site not specified: Secondary | ICD-10-CM

## 2019-01-28 DIAGNOSIS — N3090 Cystitis, unspecified without hematuria: Secondary | ICD-10-CM

## 2019-02-05 ENCOUNTER — Emergency Department: Payer: Medicare HMO

## 2019-02-05 ENCOUNTER — Emergency Department
Admission: EM | Admit: 2019-02-05 | Discharge: 2019-02-05 | Disposition: A | Discharge status: Home / Self Care | Payer: Medicare HMO | Attending: Emergency Medicine | Admitting: Emergency Medicine

## 2019-02-05 ENCOUNTER — Other Ambulatory Visit: Payer: Self-pay

## 2019-02-05 ENCOUNTER — Encounter: Payer: Self-pay | Admitting: Emergency Medicine

## 2019-02-05 DIAGNOSIS — Z794 Long term (current) use of insulin: Secondary | ICD-10-CM | POA: Diagnosis not present

## 2019-02-05 DIAGNOSIS — F17220 Nicotine dependence, chewing tobacco, uncomplicated: Secondary | ICD-10-CM | POA: Insufficient documentation

## 2019-02-05 DIAGNOSIS — R42 Dizziness and giddiness: Secondary | ICD-10-CM | POA: Insufficient documentation

## 2019-02-05 DIAGNOSIS — Z79899 Other long term (current) drug therapy: Secondary | ICD-10-CM | POA: Diagnosis not present

## 2019-02-05 DIAGNOSIS — I129 Hypertensive chronic kidney disease with stage 1 through stage 4 chronic kidney disease, or unspecified chronic kidney disease: Secondary | ICD-10-CM | POA: Insufficient documentation

## 2019-02-05 DIAGNOSIS — R079 Chest pain, unspecified: Secondary | ICD-10-CM | POA: Diagnosis not present

## 2019-02-05 DIAGNOSIS — N183 Chronic kidney disease, stage 3 unspecified: Secondary | ICD-10-CM | POA: Insufficient documentation

## 2019-02-05 DIAGNOSIS — E1122 Type 2 diabetes mellitus with diabetic chronic kidney disease: Secondary | ICD-10-CM | POA: Insufficient documentation

## 2019-02-05 DIAGNOSIS — R0789 Other chest pain: Secondary | ICD-10-CM | POA: Diagnosis present

## 2019-02-05 LAB — CBC
HCT: 40.5 % (ref 39.0–52.0)
Hemoglobin: 12.9 g/dL — ABNORMAL LOW (ref 13.0–17.0)
MCH: 28.9 pg (ref 26.0–34.0)
MCHC: 31.9 g/dL (ref 30.0–36.0)
MCV: 90.6 fL (ref 80.0–100.0)
Platelets: 241 10*3/uL (ref 150–400)
RBC: 4.47 MIL/uL (ref 4.22–5.81)
RDW: 13.9 % (ref 11.5–15.5)
WBC: 8.6 10*3/uL (ref 4.0–10.5)
nRBC: 0 % (ref 0.0–0.2)

## 2019-02-05 LAB — BASIC METABOLIC PANEL
Anion gap: 9 (ref 5–15)
BUN: 27 mg/dL — ABNORMAL HIGH (ref 8–23)
CO2: 18 mmol/L — ABNORMAL LOW (ref 22–32)
Calcium: 9.2 mg/dL (ref 8.9–10.3)
Chloride: 109 mmol/L (ref 98–111)
Creatinine, Ser: 1.62 mg/dL — ABNORMAL HIGH (ref 0.61–1.24)
GFR calc Af Amer: 50 mL/min — ABNORMAL LOW (ref >60)
GFR calc non Af Amer: 43 mL/min — ABNORMAL LOW (ref >60)
Glucose, Bld: 168 mg/dL — ABNORMAL HIGH (ref 70–99)
Potassium: 4.2 mmol/L (ref 3.5–5.1)
Sodium: 136 mmol/L (ref 135–145)

## 2019-02-05 LAB — TROPONIN I (HIGH SENSITIVITY)
Troponin I (High Sensitivity): 4 ng/L (ref <18)
Troponin I (High Sensitivity): 4 ng/L (ref <18)

## 2019-02-05 MED ORDER — IOHEXOL 350 MG/ML SOLN
60.0000 mL | Freq: Once | INTRAVENOUS | Status: AC | PRN
  Administered 2019-02-05: 60 mL via INTRAVENOUS
  Filled 2019-02-05: qty 60

## 2019-02-05 NOTE — ED Notes (Signed)
Called for room assignment x1. No answer. 

## 2019-02-05 NOTE — ED Triage Notes (Signed)
First Nurse Note: Arrives from [facility] for ED evaluation of chest pressure and chest pain.  Patient has history of PE.  AAOx3.  Skin warm and dry. No SOB/ DOE.  NAD

## 2019-02-05 NOTE — ED Triage Notes (Signed)
PT from home c/o chest pressure with movement and when walking x93month. PT hx of PE. VSS, NAD noted

## 2019-02-05 NOTE — ED Provider Notes (Signed)
[facility] Emergency Department Provider Note   ____________________________________________    I have reviewed the triage vital signs and the nursing notes.   HISTORY  Chief Complaint Chest Pain     HPI [name] is a 68 y.o. male with a history as noted below who presents with complaints of feeling like he is going to "fall out "when walking up his driveway.  He denies significant chest pain, no palpitations.  Does feel "winded ".  Only with exertion.  Not currently.  He reports this feels similar to when he had a PE 2 years ago, he was apparently on Eliquis for 6 months and that was then discontinued.  He has been doing well since then.  He is scheduled for back surgery in December so he wants to make sure that he does not have a blood clot.  No pleurisy.  No fevers or chills  Past Medical History:  Diagnosis Date  . Allergy   . Chronic headaches   . GERD (gastroesophageal reflux disease)   . Heart burn   . Hyperlipidemia   . Hypertension     Patient Active Problem List   Diagnosis Date Noted  . Elevated PSA, less than 10 ng/ml 10/03/2017  . Lumbar radiculopathy 10/03/2017  . Myalgia due to statin 10/03/2017  . Obesity (BMI 30.0-34.9) 10/03/2017  . History of pulmonary embolus (PE) 12/05/2016  . Chronic pain of left knee 10/07/2016  . CKD (chronic kidney disease), stage III 10/07/2016  . Muscle strain 11/17/2015  . Low back pain 10/08/2015  . Hx of hematuria 07/16/2015  . Ganglion cyst of left foot 01/23/2015  . Smokeless tobacco use 01/12/2015  . Type 2 diabetes mellitus with diabetic nephropathy (HCC) 10/16/2014  . Hypertension 10/16/2014  . Hyperlipidemia associated with type 2 diabetes mellitus (HCC) 10/16/2014  . Gout 10/16/2014  . GERD without esophagitis 10/16/2014  . Injury of tendon of upper extremity 05/29/2014  . Glenoid labral tear 05/29/2014  . Complete rotator cuff rupture of left shoulder 05/09/2014  .  Infraspinatus tenosynovitis 05/09/2014  . Other synovitis and tenosynovitis, right shoulder 05/09/2014    Past Surgical History:  Procedure Laterality Date  . KNEE SURGERY    . SHOULDER SURGERY    . TONSILLECTOMY      Prior to Admission medications   Medication Sig Start Date End Date Taking? Authorizing Provider  ACCU-CHEK SMARTVIEW test strip Check sugar 2 times daily 04/23/18   Smitty CordsKaramalegos, Alexander J, DO  allopurinol (ZYLOPRIM) 100 MG tablet Take 1 tablet (100 mg total) by mouth at bedtime. 06/07/18   Karamalegos, Netta NeatAlexander J, DO  amLODipine (NORVASC) 2.5 MG tablet TAKE 1 TABLET BY MOUTH AT BEDTIME 01/02/19   Karamalegos, Netta NeatAlexander J, DO  BYDUREON BCISE 2 MG/0.85ML AUIJ INJECT 2 MG INTO THE SKIN ONCE A WEEK. 11/14/18   Karamalegos, Netta NeatAlexander J, DO  calcium carbonate (TUMS - DOSED IN MG ELEMENTAL CALCIUM) 500 MG chewable tablet Chew 2 tablets by mouth at bedtime as needed for indigestion or heartburn.     [provider]  colchicine 0.6 MG tablet Take 1 tablet (0.6 mg total) by mouth as needed (gout flare). 04/04/17   Karamalegos, Netta NeatAlexander J, DO  fluticasone (FLONASE) 50 MCG/ACT nasal spray USE 2 SPRAY(S) IN EACH NOSTRIL ONCE DAILY USE FOR 4-6 WEEKS THEN STOP AND USE SEASONALLY OR AS NEEDED 05/06/18   Althea CharonKaramalegos, Netta NeatAlexander J, DO  Insulin Glargine (LANTUS SOLOSTAR) 100 UNIT/ML Solostar Pen Inject 16 Units into the skin  daily at 10 pm. Patient taking differently: Inject 17 Units into the skin daily at 10 pm.  04/23/18   [name], [name], DO  Insulin Pen Needle (NOVOFINE PLUS) 32G X 4 MM MISC Use with Lantus insulin daily injection as instructed 06/19/17   Karamalegos, [name], DO  lisinopril (PRINIVIL,ZESTRIL) 40 MG tablet Take 1 tablet (40 mg total) by mouth daily. 06/07/18   Karamalegos, [name], DO  metFORMIN (GLUCOPHAGE-XR) 750 MG 24 hr tablet Take 2 tablets (1,500 mg total) by mouth daily with breakfast. May start with only 1 tab, and increase as tolerated. Patient  taking differently: Take 750 mg by mouth 2 (two) times daily.  04/23/18   Karamalegos, [name], DO  omeprazole (PRILOSEC) 20 MG capsule Take 1 capsule (20 mg total) by mouth daily. 06/07/18   Karamalegos, [name], DO     Allergies Crestor [rosuvastatin calcium], Invokana [canagliflozin], and Prednisone  Family History  Problem Relation Age of Onset  . Cancer Father        lung  . Heart disease Sister   . Heart disease Brother   . Prostate cancer Neg Hx     Social History Social History   Tobacco Use  . Smoking status: Former Smoker    Types: Cigars    Quit date: 2000    Years since quitting: 20.8  . Smokeless tobacco: Current User    Types: Chew  . Tobacco comment: wife states patient never smoked cigarettes, just chewing tobacco  Substance Use Topics  . Alcohol use: Not Currently    Alcohol/week: 0.0 standard drinks  . Drug use: No    Review of Systems  Constitutional: No fever/chills Eyes: No visual changes.  ENT: No sore throat. Cardiovascular: As above Respiratory: As above Gastrointestinal: No abdominal pain.  No nausea, no vomiting.   Genitourinary: Negative for dysuria. Musculoskeletal: Negative for back pain. Skin: Negative for rash. Neurological: Negative for headaches    ____________________________________________   PHYSICAL EXAM:  VITAL SIGNS: ED Triage Vitals  Enc Vitals Group     BP 02/05/19 1201 132/63     Pulse Rate 02/05/19 1201 84     Resp 02/05/19 1201 18     Temp 02/05/19 1201 99.3 F (37.4 C)     Temp Source 02/05/19 1201 Oral     SpO2 02/05/19 1201 99 %     Weight 02/05/19 1202 99.3 kg (219 lb)     Height 02/05/19 1202 1.803 m (5\' 11" )     Head Circumference --      Peak Flow --      Pain Score 02/05/19 1206 6     Pain Loc --      Pain Edu? --      Excl. in [place]? --     Constitutional: Alert and oriented. e  Nose: No congestion/rhinnorhea. Mouth/Throat: Mucous membranes are moist.    Cardiovascular: Normal rate,  regular rhythm.  Good peripheral circulation. Respiratory: Normal respiratory effort.  No retractions. Lungs CTAB. Gastrointestinal: Soft and nontender. No distention.  No CVA tenderness.  Musculoskeletal: No lower extremity tenderness nor edema.  Warm and well perfused Neurologic:  Normal speech and language. No gross focal neurologic deficits are appreciated.  Skin:  Skin is warm, dry and intact. No rash noted. Psychiatric: Mood and affect are normal. Speech and behavior are normal.  ____________________________________________   LABS (all labs ordered are listed, but only abnormal results are displayed)  Labs Reviewed  BASIC METABOLIC PANEL - Abnormal; Notable for  the following components:      Result Value   CO2 18 (*)    Glucose, Bld 168 (*)    BUN 27 (*)    Creatinine, Ser 1.62 (*)    GFR calc non Af Amer 43 (*)    GFR calc Af Amer 50 (*)    All other components within normal limits  CBC - Abnormal; Notable for the following components:   Hemoglobin 12.9 (*)    All other components within normal limits  TROPONIN I (HIGH SENSITIVITY)  TROPONIN I (HIGH SENSITIVITY)   ____________________________________________  EKG  ED ECG REPORT I, Jene Every, the attending physician, personally viewed and interpreted this ECG.  Date: 02/05/2019  Rhythm: normal sinus rhythm QRS Axis: Left axis deviation Intervals: normal ST/T Wave abnormalities: normal Narrative Interpretation: no evidence of acute ischemia  ____________________________________________  RADIOLOGY  Chest x-ray unremarkable CT angiography negative for PE ____________________________________________   PROCEDURES  Procedure(s) performed: No  Procedures   Critical Care performed: No ____________________________________________   INITIAL IMPRESSION / ASSESSMENT AND PLAN / ED COURSE  Pertinent labs & imaging results that were available during my care of the patient were reviewed by me and  considered in my medical decision making (see chart for details).  Patient well-appearing and in no acute distress, lab work is reassuring, EKG is unchanged from prior.  No chest pain or palpitations.  No shortness of breath at this time.  Primary complaint is lightheadedness with exertion.  Given history CT angiography obtained negative for PE.  Troponin negative x2.  I recommended follow-up with cardiology for further evaluation and preop clearance, patient and family agree with this plan    ____________________________________________   FINAL CLINICAL IMPRESSION(S) / ED DIAGNOSES  Final diagnoses:  Dizziness        Note:  This document was prepared using Dragon voice recognition software and may include unintentional dictation errors.   Jene Every, MD 02/05/19 [phone]

## 2019-02-05 NOTE — ED Notes (Signed)
Pt reports has been having problems with his chest for a couple of months. Pt reports he gets more and more SOB when he walks. Pt states had a blockage in his lungs awhile back and his sx's are there same.

## 2019-02-06 ENCOUNTER — Ambulatory Visit: Payer: Medicare HMO | Admitting: Family Medicine

## 2019-02-07 ENCOUNTER — Other Ambulatory Visit: Payer: Self-pay

## 2019-02-07 ENCOUNTER — Ambulatory Visit (INDEPENDENT_AMBULATORY_CARE_PROVIDER_SITE_OTHER): Payer: Medicare HMO | Admitting: Cardiology

## 2019-02-07 ENCOUNTER — Encounter: Payer: Self-pay | Admitting: Cardiology

## 2019-02-07 VITALS — BP 121/73 | HR 94 | Ht 71.0 in | Wt 228.0 lb

## 2019-02-07 DIAGNOSIS — I10 Essential (primary) hypertension: Secondary | ICD-10-CM | POA: Diagnosis not present

## 2019-02-07 DIAGNOSIS — R0609 Other forms of dyspnea: Secondary | ICD-10-CM

## 2019-02-07 DIAGNOSIS — R0600 Dyspnea, unspecified: Secondary | ICD-10-CM | POA: Diagnosis not present

## 2019-02-07 NOTE — Patient Instructions (Signed)
Medication Instructions:  - Your physician recommends that you continue on your current medications as directed. Please refer to the Current Medication list given to you today.  *If you need a refill on your cardiac medications before your next appointment, please call your pharmacy*  Lab Work: - none ordered  If you have labs (blood work) drawn today and your tests are completely normal, you will receive your results only by:  [place] (if you have MyChart) OR  A paper copy in the mail If you have any lab test that is abnormal or we need to change your treatment, we will call you to review the results.  Testing/Procedures: - Your physician has requested that you have an echocardiogram. Echocardiography is a painless test that uses sound waves to create images of your heart. It provides your doctor with information about the size and shape of your heart and how well your hearts chambers and valves are working. This procedure takes approximately one hour. There are no restrictions for this procedure.   - Your physician has requested that you have a lexiscan myoview.   [place]  Your caregiver has ordered a Stress Test with nuclear imaging. The purpose of this test is to evaluate the blood supply to your heart muscle. This procedure is referred to as a "Non-Invasive Stress Test." This is because other than having an IV started in your vein, nothing is inserted or "invades" your body. Cardiac stress tests are done to find areas of poor blood flow to the heart by determining the extent of coronary artery disease (CAD). Some patients exercise on a treadmill, which naturally increases the blood flow to your heart, while others who are  unable to walk on a treadmill due to physical limitations have a pharmacologic/chemical stress agent called Lexiscan . This medicine will mimic walking on a treadmill by temporarily increasing your coronary blood flow.   Please note: these test may take  anywhere between 2-4 hours to complete  PLEASE REPORT TO [place] AT THE FIRST DESK WILL DIRECT YOU WHERE TO GO  Date of Procedure:_____________________________________  Arrival Time for Procedure:______________________________  Instructions regarding medication:   _x___ : Hold diabetes medication morning of procedure (all oral & insulins)    PLEASE NOTIFY THE OFFICE AT LEAST 24 HOURS IN ADVANCE IF YOU ARE UNABLE TO KEEP YOUR APPOINTMENT.  [phone] AND  PLEASE NOTIFY NUCLEAR MEDICINE AT [facility] AT LEAST 24 HOURS IN ADVANCE IF YOU ARE UNABLE TO KEEP YOUR APPOINTMENT. [phone]  How to prepare for your Myoview test:  1. Do not eat or drink after midnight 2. No caffeine for 24 hours prior to test 3. No smoking 24 hours prior to test. 4. Your medication may be taken with water.  If your doctor stopped a medication because of this test, do not take that medication. 5. Ladies, please do not wear dresses.  Skirts or pants are appropriate. Please wear a short sleeve shirt. 6. No perfume, cologne or lotion. 7. Wear comfortable walking shoes. No heels!  Follow-Up: At [facility], you and your health needs are our priority.  As part of our continuing mission to provide you with exceptional heart care, we have created designated Provider Care Teams.  These Care Teams include your primary Cardiologist (physician) and Advanced Practice Providers (APPs -  Physician Assistants and Nurse Practitioners) who all work together to provide you with the care you need, when you need it.  Your next appointment:  after both test are completed  The format for your next appointment:   In Person  Provider:   Debbe Odea, MD  Other Instructions - n/a   Echocardiogram An echocardiogram is a procedure that uses painless sound waves (ultrasound) to produce an image of the heart. Images from an echocardiogram can provide important information  about:  Signs of coronary artery disease (CAD).  Aneurysm detection. An aneurysm is a weak or damaged part of an artery wall that bulges out from the normal force of blood pumping through the body.  Heart size and shape. Changes in the size or shape of the heart can be associated with certain conditions, including heart failure, aneurysm, and CAD.  Heart muscle function.  Heart valve function.  Signs of a past heart attack.  Fluid buildup around the heart.  Thickening of the heart muscle.  A tumor or infectious growth around the heart valves. Tell a health care provider about:  Any allergies you have.  All medicines you are taking, including vitamins, herbs, eye drops, creams, and over-the-counter medicines.  Any blood disorders you have.  Any surgeries you have had.  Any medical conditions you have.  Whether you are pregnant or may be pregnant. What are the risks? Generally, this is a safe procedure. However, problems may occur, including:  Allergic reaction to dye (contrast) that may be used during the procedure. What happens before the procedure? No specific preparation is needed. You may eat and drink normally. What happens during the procedure?   An IV tube may be inserted into one of your veins.  You may receive contrast through this tube. A contrast is an injection that improves the quality of the pictures from your heart.  A gel will be applied to your chest.  A wand-like tool (transducer) will be moved over your chest. The gel will help to transmit the sound waves from the transducer.  The sound waves will harmlessly bounce off of your heart to allow the heart images to be captured in real-time motion. The images will be recorded on a computer. The procedure may vary among health care providers and hospitals. What happens after the procedure?  You may return to your normal, everyday life, including diet, activities, and medicines, unless your health care  provider tells you not to do that. Summary  An echocardiogram is a procedure that uses painless sound waves (ultrasound) to produce an image of the heart.  Images from an echocardiogram can provide important information about the size and shape of your heart, heart muscle function, heart valve function, and fluid buildup around your heart.  You do not need to do anything to prepare before this procedure. You may eat and drink normally.  After the echocardiogram is completed, you may return to your normal, everyday life, unless your health care provider tells you not to do that. This information is not intended to replace advice given to you by your health care provider. Make sure you discuss any questions you have with your health care provider. Document Released: 03/11/2000 Document Revised: 07/05/2018 Document Reviewed: 04/16/2016 Elsevier Patient Education  2020 [employer] Inc[employer]    Cardiac Nuclear Scan A cardiac nuclear scan is a test that measures blood flow to the heart when a person is resting and when he or she is exercising. The test looks for problems such as:  Not enough blood reaching a portion of the heart.  The heart muscle not working normally. You may need this test if:  You  have heart disease.  You have had abnormal lab results.  You have had heart surgery or a balloon procedure to open up blocked arteries (angioplasty).  You have chest pain.  You have shortness of breath. In this test, a radioactive dye (tracer) is injected into your bloodstream. After the tracer has traveled to your heart, an imaging device is used to measure how much of the tracer is absorbed by or distributed to various areas of your heart. This procedure is usually done at a hospital and takes 2-4 hours. Tell a health care provider about:  Any allergies you have.  All medicines you are taking, including vitamins, herbs, eye drops, creams, and over-the-counter medicines.  Any problems you or  family members have had with anesthetic medicines.  Any blood disorders you have.  Any surgeries you have had.  Any medical conditions you have.  Whether you are pregnant or may be pregnant. What are the risks? Generally, this is a safe procedure. However, problems may occur, including:  Serious chest pain and heart attack. This is only a risk if the stress portion of the test is done.  Rapid heartbeat.  Sensation of warmth in your chest. This usually passes quickly.  Allergic reaction to the tracer. What happens before the procedure?  Ask your health care provider about changing or stopping your regular medicines. This is especially important if you are taking diabetes medicines or blood thinners.  Follow instructions from your health care provider about eating or drinking restrictions.  Remove your jewelry on the day of the procedure. What happens during the procedure?  An IV will be inserted into one of your veins.  Your health care provider will inject a small amount of radioactive tracer through the IV.  You will wait for 20-40 minutes while the tracer travels through your bloodstream.  Your heart activity will be monitored with an electrocardiogram (ECG).  You will lie down on an exam table.  Images of your heart will be taken for about 15-20 minutes.  You may also have a stress test. For this test, one of the following may be done: ? You will exercise on a treadmill or stationary bike. While you exercise, your heart's activity will be monitored with an ECG, and your blood pressure will be checked. ? You will be given medicines that will increase blood flow to parts of your heart. This is done if you are unable to exercise.  When blood flow to your heart has peaked, a tracer will again be injected through the IV.  After 20-40 minutes, you will get back on the exam table and have more images taken of your heart.  Depending on the type of tracer used, scans may  need to be repeated 3-4 hours later.  Your IV line will be removed when the procedure is over. The procedure may vary among health care providers and hospitals. What happens after the procedure?  Unless your health care provider tells you otherwise, you may return to your normal schedule, including diet, activities, and medicines.  Unless your health care provider tells you otherwise, you may increase your fluid intake. This will help to flush the contrast dye from your body. Drink enough fluid to keep your urine pale yellow.  Ask your health care provider, or the department that is doing the test: ? When will my results be ready? ? How will I get my results? Summary  A cardiac nuclear scan measures the blood flow to the heart when a  person is resting and when he or she is exercising.  Tell your health care provider if you are pregnant.  Before the procedure, ask your health care provider about changing or stopping your regular medicines. This is especially important if you are taking diabetes medicines or blood thinners.  After the procedure, unless your health care provider tells you otherwise, increase your fluid intake. This will help flush the contrast dye from your body.  After the procedure, unless your health care provider tells you otherwise, you may return to your normal schedule, including diet, activities, and medicines. This information is not intended to replace advice given to you by your health care provider. Make sure you discuss any questions you have with your health care provider. Document Released: 04/08/2004 Document Revised: 08/28/2017 Document Reviewed: 08/28/2017 Elsevier Patient Education  2020 [employer].

## 2019-02-07 NOTE — Progress Notes (Signed)
Cardiology Office Note:    Date:  02/07/2019   ID:  [name], DOB 14-Aug-1950, MRN [id number]  PCP:  [name], DO  Cardiologist:  Debbe Odea, MD  Electrophysiologist:  None   Referring MD: Saralyn Pilar *   Chief Complaint  Patient presents with  . New Patient (Initial Visit)    ED follow up for Dizziness. Meds reviewede verbally with patient.     History of Present Illness:    [name] is a 68 y.o. male with a hx of hypertension, hyperlipidemia, GERD, PE who presents due to fatigue and shortness of breath.  2 days ago, patient was walking to his garage yesterday, which was on an incline/slope.  He was halfway up the incline and suddenly fell shortness of breath and a feeling of fatigue.  Sitting down does not cause him to be short of breath.  He denies any chest pain, palpitations, dizziness.  He states symptoms were similar to when he had his PE 3 years ago.  He took Eliquis for about 6 months. He presented to the emergency room where chest CT angiogram was negative for PE.  High-sensitivity troponins was negative x2.  Patient states feeling dizzy only when he bends forward.  He had an episode of falling when he rose quickly from a seated position.  He attributes the fall to his poor nerves in his extremities.  Patient has a history of back pain, frequently takes injections to the back to help with pain relief.  He is scheduled for back surgery in about a month.  He states he is legs usually give out because of his nerve condition affecting his back and legs.  Past Medical History:  Diagnosis Date  . Allergy   . Chronic headaches   . GERD (gastroesophageal reflux disease)   . Heart burn   . Hyperlipidemia   . Hypertension     Past Surgical History:  Procedure Laterality Date  . KNEE SURGERY    . SHOULDER SURGERY    . TONSILLECTOMY      Current Medications: Current Meds  Medication Sig  . ACCU-CHEK SMARTVIEW test strip  Check sugar 2 times daily  . allopurinol (ZYLOPRIM) 100 MG tablet Take 1 tablet (100 mg total) by mouth at bedtime.  Marland Kitchen amLODipine (NORVASC) 2.5 MG tablet TAKE 1 TABLET BY MOUTH AT BEDTIME  . BYDUREON BCISE 2 MG/0.85ML AUIJ INJECT 2 MG INTO THE SKIN ONCE A WEEK.  . calcium carbonate (TUMS - DOSED IN MG ELEMENTAL CALCIUM) 500 MG chewable tablet Chew 2 tablets by mouth at bedtime as needed for indigestion or heartburn.   . ciprofloxacin (CIPRO) 500 MG tablet ciprofloxacin 500 mg tablet  TAKE 1 TABLET BY MOUTH TWICE DAILY FOR 7 DAYS  . colchicine 0.6 MG tablet Take 1 tablet (0.6 mg total) by mouth as needed (gout flare).  . fluticasone (FLONASE) 50 MCG/ACT nasal spray USE 2 SPRAY(S) IN EACH NOSTRIL ONCE DAILY USE FOR 4-6 WEEKS THEN STOP AND USE SEASONALLY OR AS NEEDED  . Insulin Glargine (LANTUS SOLOSTAR) 100 UNIT/ML Solostar Pen Inject 16 Units into the skin daily at 10 pm. (Patient taking differently: Inject 17 Units into the skin daily at 10 pm. )  . Insulin Pen Needle (NOVOFINE PLUS) 32G X 4 MM MISC Use with Lantus insulin daily injection as instructed  . lisinopril (PRINIVIL,ZESTRIL) 40 MG tablet Take 1 tablet (40 mg total) by mouth daily.  . metFORMIN (GLUCOPHAGE-XR) 750 MG 24 hr tablet Take 2  tablets (1,500 mg total) by mouth daily with breakfast. May start with only 1 tab, and increase as tolerated. (Patient taking differently: Take 750 mg by mouth 2 (two) times daily. )  . omeprazole (PRILOSEC) 20 MG capsule Take 1 capsule (20 mg total) by mouth daily.     Allergies:   Crestor [rosuvastatin calcium], Invokana [canagliflozin], and Prednisone   Social History   Socioeconomic History  . Marital status: Married    Spouse name: Not on file  . Number of children: Not on file  . Years of education: Not on file  . Highest education level: Not on file  Occupational History  . Occupation: retired  Engineer, production  . Financial resource strain: Not hard at all  . Food insecurity    Worry: Never  true    Inability: Never true  . Transportation needs    Medical: No    Non-medical: No  Tobacco Use  . Smoking status: Former Smoker    Types: Cigars    Quit date: 2000    Years since quitting: 20.8  . Smokeless tobacco: Current User    Types: Chew  . Tobacco comment: wife states patient never smoked cigarettes, just chewing tobacco  Substance and Sexual Activity  . Alcohol use: Not Currently    Alcohol/week: 0.0 standard drinks  . Drug use: No  . Sexual activity: Yes    Birth control/protection: Inserts  Lifestyle  . Physical activity    Days per week: 7 days    Minutes per session: Not on file  . Stress: Not at all  Relationships  . Social connections    Talks on phone: More than three times a week    Gets together: More than three times a week    Attends religious service: Never    Active member of club or organization: No    Attends meetings of clubs or organizations: Never    Relationship status: Married  Other Topics Concern  . Not on file  Social History Narrative  . Not on file     Family History: The patient's family history includes Cancer in his father; Heart disease in his brother and sister. There is no history of Prostate cancer.  ROS:   Please see the history of present illness.     All other systems reviewed and are negative.  EKGs/Labs/Other Studies Reviewed:    The following studies were reviewed today: TTE 2016-12-12 Procedure narrative: Transthoracic echocardiography. The study   was technically difficult due to chest wall and/or lung   interference. - Left ventricle: The cavity size was normal. Wall thickness was   increased in a pattern of mild LVH. Left ventricular ejection   fraction is grossly normal but is suboptimally assessed due to   poor acoustic windows. The study is not technically sufficient to   allow evaluation of LV diastolic function. - Right ventricle: Poorly visualized. Right ventricle is upper   normal to mildly  dilated. Contraction is grossly normal in the   parasternal views but is suboptimally characterized due to poor   acoustic windows.  EKG:  EKG is  ordered today.  The ekg ordered today demonstrates sinus rhythm, left axis deviation, possible old anteroseptal infarct.  Recent Labs: 11/29/2018: ALT 17 02/05/2019: BUN 27; Creatinine, Ser 1.62; Hemoglobin 12.9; Platelets 241; Potassium 4.2; Sodium 136  Recent Lipid Panel    Component Value Date/Time   CHOL 200 (H) 11/29/2018 0943   TRIG 168 (H) 11/29/2018 0943   HDL 36 (  L) 11/29/2018 0943   CHOLHDL 5.6 (H) 11/29/2018 0943   LDLCALC 134 (H) 11/29/2018 0943    Physical Exam:    VS:  BP 121/73 (BP Location: Left Arm, Patient Position: Sitting, Cuff Size: Normal)   Pulse 94   Ht 5\' 11"  (1.803 m)   Wt 228 lb (103.4 kg)   BMI 31.80 kg/m     Wt Readings from Last 3 Encounters:  02/07/19 228 lb (103.4 kg)  02/05/19 219 lb (99.3 kg)  01/25/19 227 lb 9.6 oz (103.2 kg)     GEN:  Well nourished, well developed in no acute distress HEENT: Normal NECK: No JVD; No carotid bruits LYMPHATICS: No lymphadenopathy CARDIAC: RRR, no murmurs, rubs, gallops RESPIRATORY:  Clear to auscultation without rales, wheezing or rhonchi  ABDOMEN: Soft, non-tender, non-distended MUSCULOSKELETAL:  No edema; No deformity  SKIN: Warm and dry NEUROLOGIC:  Alert and oriented x 3 PSYCHIATRIC:  Normal affect   ASSESSMENT:   Patient with history of hypertension, hyperlipidemia, diabetes presenting dyspnea on exertion.  Due to his risk factors, patient is currently intermediate risk for CAD.  Orthostatic vitals obtained in the office did not show any evidence for orthostasis.  1. Dyspnea on exertion   2. Essential hypertension    PLAN:    Get echocardiogram, pharmacologic myocardial perfusion imaging stress test. Blood pressures well controlled continue current management. Patient encouraged to slowly rise from seated position and should not do abrupt  movements.  Follow-up after above test.   Total encounter time more than 45 minutes  Greater than 50% was spent in counseling and coordination of care with the patient  This note was generated in part or whole with voice recognition software. Voice recognition is usually quite accurate but there are transcription errors that can and very often do occur. I apologize for any typographical errors that were not detected and corrected.  Medication Adjustments/Labs and Tests Ordered: Current medicines are reviewed at length with the patient today.  Concerns regarding medicines are outlined above.  Orders Placed This Encounter  Procedures  . NM Myocar Multi W/Spect W/Wall Motion / EF  . EKG 12-Lead  . ECHOCARDIOGRAM COMPLETE   No orders of the defined types were placed in this encounter.   Patient Instructions  Medication Instructions:  - Your physician recommends that you continue on your current medications as directed. Please refer to the Current Medication list given to you today.  *If you need a refill on your cardiac medications before your next appointment, please call your pharmacy*  Lab Work: - none ordered  If you have labs (blood work) drawn today and your tests are completely normal, you will receive your results only by: Marland Kitchen MyChart Message (if you have MyChart) OR . A paper copy in the mail If you have any lab test that is abnormal or we need to change your treatment, we will call you to review the results.  Testing/Procedures: - Your physician has requested that you have an echocardiogram. Echocardiography is a painless test that uses sound waves to create images of your heart. It provides your doctor with information about the size and shape of your heart and how well your heart's chambers and valves are working. This procedure takes approximately one hour. There are no restrictions for this procedure.   - Your physician has requested that you have a lexiscan myoview.    Parc  Your caregiver has ordered a Stress Test with nuclear imaging. The purpose of this test is to  evaluate the blood supply to your heart muscle. This procedure is referred to as a "Non-Invasive Stress Test." This is because other than having an IV started in your vein, nothing is inserted or "invades" your body. Cardiac stress tests are done to find areas of poor blood flow to the heart by determining the extent of coronary artery disease (CAD). Some patients exercise on a treadmill, which naturally increases the blood flow to your heart, while others who are  unable to walk on a treadmill due to physical limitations have a pharmacologic/chemical stress agent called Lexiscan . This medicine will mimic walking on a treadmill by temporarily increasing your coronary blood flow.   Please note: these test may take anywhere between 2-4 hours to complete  PLEASE REPORT TO [facility]  THE VOLUNTEERS AT THE FIRST DESK WILL DIRECT YOU WHERE TO GO  Date of Procedure:_____________________________________  Arrival Time for Procedure:______________________________  Instructions regarding medication:   _x___ : Hold diabetes medication morning of procedure (all oral & insulins)    PLEASE NOTIFY THE OFFICE AT LEAST 24 HOURS IN ADVANCE IF YOU ARE UNABLE TO KEEP YOUR APPOINTMENT.  [phone] AND  PLEASE NOTIFY NUCLEAR MEDICINE AT [facility] AT LEAST 24 HOURS IN ADVANCE IF YOU ARE UNABLE TO KEEP YOUR APPOINTMENT. [phone]  How to prepare for your Myoview test:  1. Do not eat or drink after midnight 2. No caffeine for 24 hours prior to test 3. No smoking 24 hours prior to test. 4. Your medication may be taken with water.  If your doctor stopped a medication because of this test, do not take that medication. 5. Ladies, please do not wear dresses.  Skirts or pants are appropriate. Please wear a short sleeve shirt. 6. No perfume, cologne or lotion. 7. Wear comfortable walking  shoes. No heels!  Follow-Up: At [facility], you and your health needs are our priority.  As part of our continuing mission to provide you with exceptional heart care, we have created designated Provider Care Teams.  These Care Teams include your primary Cardiologist (physician) and Advanced Practice Providers (APPs -  Physician Assistants and Nurse Practitioners) who all work together to provide you with the care you need, when you need it.  Your next appointment:   after both test are completed  The format for your next appointment:   In Person  Provider:   Debbe OdeaBrian Agbor-Etang, MD  Other Instructions - n/a   Echocardiogram An echocardiogram is a procedure that uses painless sound waves (ultrasound) to produce an image of the heart. Images from an echocardiogram can provide important information about:  Signs of coronary artery disease (CAD).  Aneurysm detection. An aneurysm is a weak or damaged part of an artery wall that bulges out from the normal force of blood pumping through the body.  Heart size and shape. Changes in the size or shape of the heart can be associated with certain conditions, including heart failure, aneurysm, and CAD.  Heart muscle function.  Heart valve function.  Signs of a past heart attack.  Fluid buildup around the heart.  Thickening of the heart muscle.  A tumor or infectious growth around the heart valves. Tell a health care provider about:  Any allergies you have.  All medicines you are taking, including vitamins, herbs, eye drops, creams, and over-the-counter medicines.  Any blood disorders you have.  Any surgeries you have had.  Any medical conditions you have.  Whether you are pregnant or may be pregnant. What  are the risks? Generally, this is a safe procedure. However, problems may occur, including:  Allergic reaction to dye (contrast) that may be used during the procedure. What happens before the procedure? No specific  preparation is needed. You may eat and drink normally. What happens during the procedure?   An IV tube may be inserted into one of your veins.  You may receive contrast through this tube. A contrast is an injection that improves the quality of the pictures from your heart.  A gel will be applied to your chest.  A wand-like tool (transducer) will be moved over your chest. The gel will help to transmit the sound waves from the transducer.  The sound waves will harmlessly bounce off of your heart to allow the heart images to be captured in real-time motion. The images will be recorded on a computer. The procedure may vary among health care providers and hospitals. What happens after the procedure?  You may return to your normal, everyday life, including diet, activities, and medicines, unless your health care provider tells you not to do that. Summary  An echocardiogram is a procedure that uses painless sound waves (ultrasound) to produce an image of the heart.  Images from an echocardiogram can provide important information about the size and shape of your heart, heart muscle function, heart valve function, and fluid buildup around your heart.  You do not need to do anything to prepare before this procedure. You may eat and drink normally.  After the echocardiogram is completed, you may return to your normal, everyday life, unless your health care provider tells you not to do that. This information is not intended to replace advice given to you by your health care provider. Make sure you discuss any questions you have with your health care provider. Document Released: 03/11/2000 Document Revised: 07/05/2018 Document Reviewed: 04/16/2016 Elsevier Patient Education  2020 Elsevier Inc.    Cardiac Nuclear Scan A cardiac nuclear scan is a test that measures blood flow to the heart when a person is resting and when he or she is exercising. The test looks for problems such as:  Not enough  blood reaching a portion of the heart.  The heart muscle not working normally. You may need this test if:  You have heart disease.  You have had abnormal lab results.  You have had heart surgery or a balloon procedure to open up blocked arteries (angioplasty).  You have chest pain.  You have shortness of breath. In this test, a radioactive dye (tracer) is injected into your bloodstream. After the tracer has traveled to your heart, an imaging device is used to measure how much of the tracer is absorbed by or distributed to various areas of your heart. This procedure is usually done at a hospital and takes 2-4 hours. Tell a health care provider about:  Any allergies you have.  All medicines you are taking, including vitamins, herbs, eye drops, creams, and over-the-counter medicines.  Any problems you or family members have had with anesthetic medicines.  Any blood disorders you have.  Any surgeries you have had.  Any medical conditions you have.  Whether you are pregnant or may be pregnant. What are the risks? Generally, this is a safe procedure. However, problems may occur, including:  Serious chest pain and heart attack. This is only a risk if the stress portion of the test is done.  Rapid heartbeat.  Sensation of warmth in your chest. This usually passes quickly.  Allergic reaction  to the tracer. What happens before the procedure?  Ask your health care provider about changing or stopping your regular medicines. This is especially important if you are taking diabetes medicines or blood thinners.  Follow instructions from your health care provider about eating or drinking restrictions.  Remove your jewelry on the day of the procedure. What happens during the procedure?  An IV will be inserted into one of your veins.  Your health care provider will inject a small amount of radioactive tracer through the IV.  You will wait for 20-40 minutes while the tracer travels  through your bloodstream.  Your heart activity will be monitored with an electrocardiogram (ECG).  You will lie down on an exam table.  Images of your heart will be taken for about 15-20 minutes.  You may also have a stress test. For this test, one of the following may be done: ? You will exercise on a treadmill or stationary bike. While you exercise, your heart's activity will be monitored with an ECG, and your blood pressure will be checked. ? You will be given medicines that will increase blood flow to parts of your heart. This is done if you are unable to exercise.  When blood flow to your heart has peaked, a tracer will again be injected through the IV.  After 20-40 minutes, you will get back on the exam table and have more images taken of your heart.  Depending on the type of tracer used, scans may need to be repeated 3-4 hours later.  Your IV line will be removed when the procedure is over. The procedure may vary among health care providers and hospitals. What happens after the procedure?  Unless your health care provider tells you otherwise, you may return to your normal schedule, including diet, activities, and medicines.  Unless your health care provider tells you otherwise, you may increase your fluid intake. This will help to flush the contrast dye from your body. Drink enough fluid to keep your urine pale yellow.  Ask your health care provider, or the department that is doing the test: ? When will my results be ready? ? How will I get my results? Summary  A cardiac nuclear scan measures the blood flow to the heart when a person is resting and when he or she is exercising.  Tell your health care provider if you are pregnant.  Before the procedure, ask your health care provider about changing or stopping your regular medicines. This is especially important if you are taking diabetes medicines or blood thinners.  After the procedure, unless your health care provider  tells you otherwise, increase your fluid intake. This will help flush the contrast dye from your body.  After the procedure, unless your health care provider tells you otherwise, you may return to your normal schedule, including diet, activities, and medicines. This information is not intended to replace advice given to you by your health care provider. Make sure you discuss any questions you have with your health care provider. Document Released: 04/08/2004 Document Revised: 08/28/2017 Document Reviewed: 08/28/2017 Elsevier Patient Education  2020 [employer].      Signed, Debbe Odea, MD  02/07/2019 2:28 PM    [facility]

## 2019-02-08 ENCOUNTER — Telehealth: Payer: Self-pay

## 2019-02-08 NOTE — Telephone Encounter (Signed)
Attempted to contact the pt, no answer. LMOM that the patient just needs to contact the [facility] and request a refill.

## 2019-02-08 NOTE — Telephone Encounter (Signed)
Patients wife requested for [place] to reorder [facility]'s insulin through his patient asst program.

## 2019-02-18 ENCOUNTER — Encounter
Admission: RE | Admit: 2019-02-18 | Discharge: 2019-02-18 | Disposition: A | Discharge status: Home / Self Care | Payer: Medicare HMO | Source: Ambulatory Visit | Attending: Cardiology | Admitting: Cardiology

## 2019-02-18 ENCOUNTER — Other Ambulatory Visit: Payer: Self-pay

## 2019-02-18 DIAGNOSIS — I10 Essential (primary) hypertension: Secondary | ICD-10-CM | POA: Diagnosis not present

## 2019-02-18 DIAGNOSIS — R0609 Other forms of dyspnea: Secondary | ICD-10-CM

## 2019-02-18 DIAGNOSIS — R0600 Dyspnea, unspecified: Secondary | ICD-10-CM | POA: Insufficient documentation

## 2019-02-18 LAB — NM MYOCAR MULTI W/SPECT W/WALL MOTION / EF
Estimated workload: 1 METS
Exercise duration (min): 0 min
Exercise duration (sec): 0 s
LV dias vol: 88 mL (ref 62–150)
LV sys vol: 27 mL
MPHR: 153 {beats}/min
Peak HR: 91 {beats}/min
Percent HR: 59 %
Rest HR: 67 {beats}/min
SDS: 2
SRS: 11
SSS: 5
TID: 0.96

## 2019-02-18 MED ORDER — TECHNETIUM TC 99M TETROFOSMIN IV KIT
32.5700 | PACK | Freq: Once | INTRAVENOUS | Status: AC | PRN
  Administered 2019-02-18: 32.57 via INTRAVENOUS

## 2019-02-18 MED ORDER — REGADENOSON 0.4 MG/5ML IV SOLN
0.4000 mg | Freq: Once | INTRAVENOUS | Status: AC
  Administered 2019-02-18: 0.4 mg via INTRAVENOUS

## 2019-02-18 MED ORDER — TECHNETIUM TC 99M TETROFOSMIN IV KIT
10.1900 | PACK | Freq: Once | INTRAVENOUS | Status: AC | PRN
  Administered 2019-02-18: 10.19 via INTRAVENOUS

## 2019-02-19 ENCOUNTER — Ambulatory Visit: Payer: Self-pay | Admitting: Pharmacist

## 2019-02-19 ENCOUNTER — Telehealth: Payer: Self-pay

## 2019-02-19 DIAGNOSIS — M48062 Spinal stenosis, lumbar region with neurogenic claudication: Secondary | ICD-10-CM | POA: Diagnosis not present

## 2019-02-19 DIAGNOSIS — E1121 Type 2 diabetes mellitus with diabetic nephropathy: Secondary | ICD-10-CM

## 2019-02-19 DIAGNOSIS — E119 Type 2 diabetes mellitus without complications: Secondary | ICD-10-CM | POA: Diagnosis not present

## 2019-02-19 DIAGNOSIS — Z01818 Encounter for other preprocedural examination: Secondary | ICD-10-CM | POA: Diagnosis not present

## 2019-02-19 DIAGNOSIS — Z01812 Encounter for preprocedural laboratory examination: Secondary | ICD-10-CM | POA: Diagnosis not present

## 2019-02-19 DIAGNOSIS — Z86718 Personal history of other venous thrombosis and embolism: Secondary | ICD-10-CM | POA: Diagnosis not present

## 2019-02-19 DIAGNOSIS — Z86711 Personal history of pulmonary embolism: Secondary | ICD-10-CM | POA: Diagnosis not present

## 2019-02-19 DIAGNOSIS — M4316 Spondylolisthesis, lumbar region: Secondary | ICD-10-CM | POA: Diagnosis not present

## 2019-02-19 NOTE — Patient Instructions (Signed)
Thank you allowing the Chronic Care Management Team to be a part of your care! It was a pleasure speaking with you today!     CCM (Chronic Care Management) Team    Janci Minor RN, BSN Nurse Care Coordinator  [phone]   Harlow Asa PharmD  Clinical Pharmacist  [phone]   Eula Fried LCSW Clinical Social Worker [phone]  Visit Information  Goals Addressed            This Visit's Progress   . PharmD - "He's in the donut hole" (pt-stated)       Current Barriers:  . Financial Barriers - Patient currently in coverage gap of Part D plan . Lack of blood sugar results for clinical team  Pharmacist Clinical Goal(s):  Marland Kitchen Over the next 30 days, patient will work with CM Pharmacist and PCP to address needs related to medication assistance and diabetes management  Interventions: . Reschedule telephone appointment from today as requested in voicemail from patient . Reports patient had back surgery pre-operative visit today; procedure scheduled for: 12/1 . Counsel regarding medication assistance application process for 2021 o Mrs. Kiener reports needing assistance with obtaining of refill of patient's Lantus through patient assistance program . Outreach to [employer] patient assistance program to request refill of patient's Lantus o Representative states patient will receive a 30 day supply, which should be shipped to office next week  Patient Self Care Activities:  . Self administers medications as prescribed with assistance from his wife o Uses weekly pillbox as adherence tool . Attends all scheduled provider appointments . Calls pharmacy for medication refills . Calls provider office for new concerns or questions . To check blood sugars twice daily and keep log  Please see past updates related to this goal by clicking on the "Past Updates" button in the selected goal         The patient verbalized understanding of instructions provided today and declined a  print copy of patient instruction materials.   Telephone follow up appointment with care management team member scheduled for: 12/9 at 2:30pm  Harlow Asa, PharmD, [place] [phone]

## 2019-02-19 NOTE — Chronic Care Management (AMB) (Signed)
Chronic Care Management   Follow Up Note   02/19/2019 Name: [name] MRN: [id number] DOB: 10/27/1950  Referred by: [name], DO Reason for referral : Chronic Care Management (Patient Phone Call) and Care Coordination   [name] is a 68 y.o. year old male who is a primary care patient of [name], DO. The CCM team was consulted for assistance with chronic disease management and care coordination needs.  [name] has a past medical history including but not limited to type 2 diabetes mellitus, hypertension, hyperlipidemia, GERD, gout, seasonal allergies and chronic back pain.  Receive voicemail from patient requesting call back to reschedule today's telephone appointment.  I reached out to [name] by phone. Patient is not home, but speak with [name].  Review of patient status, including review of consultants reports, relevant laboratory and other test results, and collaboration with appropriate care team members and the patient's provider was performed as part of comprehensive patient evaluation and provision of chronic care management services.     Outpatient Encounter Medications as of 02/19/2019  Medication Sig Note  . ACCU-CHEK SMARTVIEW test strip Check sugar 2 times daily   . allopurinol (ZYLOPRIM) 100 MG tablet Take 1 tablet (100 mg total) by mouth at bedtime.   Marland Kitchen amLODipine (NORVASC) 2.5 MG tablet TAKE 1 TABLET BY MOUTH AT BEDTIME   . BYDUREON BCISE 2 MG/0.85ML AUIJ INJECT 2 MG INTO THE SKIN ONCE A WEEK. 11/20/2018: On Mondays  . calcium carbonate (TUMS - DOSED IN MG ELEMENTAL CALCIUM) 500 MG chewable tablet Chew 2 tablets by mouth at bedtime as needed for indigestion or heartburn.    . ciprofloxacin (CIPRO) 500 MG tablet ciprofloxacin 500 mg tablet  TAKE 1 TABLET BY MOUTH TWICE DAILY FOR 7 DAYS   . colchicine 0.6 MG tablet Take 1 tablet (0.6 mg total) by mouth as needed (gout flare).   . fluticasone (FLONASE) 50  MCG/ACT nasal spray USE 2 SPRAY(S) IN EACH NOSTRIL ONCE DAILY USE FOR 4-6 WEEKS THEN STOP AND USE SEASONALLY OR AS NEEDED   . Insulin Glargine (LANTUS SOLOSTAR) 100 UNIT/ML Solostar Pen Inject 16 Units into the skin daily at 10 pm. (Patient taking differently: Inject 17 Units into the skin daily at 10 pm. )   . Insulin Pen Needle (NOVOFINE PLUS) 32G X 4 MM MISC Use with Lantus insulin daily injection as instructed   . lisinopril (PRINIVIL,ZESTRIL) 40 MG tablet Take 1 tablet (40 mg total) by mouth daily.   . metFORMIN (GLUCOPHAGE-XR) 750 MG 24 hr tablet Take 2 tablets (1,500 mg total) by mouth daily with breakfast. May start with only 1 tab, and increase as tolerated. (Patient taking differently: Take 750 mg by mouth 2 (two) times daily. )   . omeprazole (PRILOSEC) 20 MG capsule Take 1 capsule (20 mg total) by mouth daily.    No facility-administered encounter medications on file as of 02/19/2019.     Goals Addressed            This Visit's Progress   . PharmD - "He's in the donut hole" (pt-stated)       Current Barriers:  . Financial Barriers - Patient currently in coverage gap of Part D plan . Lack of blood sugar results for clinical team  Pharmacist Clinical Goal(s):  Marland Kitchen Over the next 30 days, patient will work with CM Pharmacist and PCP to address needs related to medication assistance and diabetes management  Interventions: . Reschedule telephone appointment  from today as requested in voicemail from patient . Reports patient had back surgery pre-operative visit today; procedure scheduled for: 12/1 . Counsel regarding medication assistance application process for 2021 o [name] reports needing assistance with obtaining of refill of patient's Lantus through patient assistance program . Outreach to [employer] patient assistance program to request refill of patient's Lantus o Representative states patient will receive a 30 day supply, which should be shipped to office next week  Patient  Self Care Activities:  . Self administers medications as prescribed with assistance from his wife o Uses weekly pillbox as adherence tool . Attends all scheduled provider appointments . Calls pharmacy for medication refills . Calls provider office for new concerns or questions . To check blood sugars twice daily and keep log  Please see past updates related to this goal by clicking on the "Past Updates" button in the selected goal         Plan  Telephone follow up appointment with care management team member scheduled for: 12/9 at 2:30pm  Harlow Asa, PharmD, [place] [phone]

## 2019-02-26 DIAGNOSIS — M109 Gout, unspecified: Secondary | ICD-10-CM | POA: Diagnosis not present

## 2019-02-26 DIAGNOSIS — N390 Urinary tract infection, site not specified: Secondary | ICD-10-CM | POA: Diagnosis not present

## 2019-02-26 DIAGNOSIS — F17220 Nicotine dependence, chewing tobacco, uncomplicated: Secondary | ICD-10-CM | POA: Diagnosis not present

## 2019-02-26 DIAGNOSIS — M4316 Spondylolisthesis, lumbar region: Secondary | ICD-10-CM | POA: Diagnosis not present

## 2019-02-26 DIAGNOSIS — K219 Gastro-esophageal reflux disease without esophagitis: Secondary | ICD-10-CM | POA: Diagnosis not present

## 2019-02-26 DIAGNOSIS — E7800 Pure hypercholesterolemia, unspecified: Secondary | ICD-10-CM | POA: Diagnosis not present

## 2019-02-26 DIAGNOSIS — M48062 Spinal stenosis, lumbar region with neurogenic claudication: Secondary | ICD-10-CM | POA: Diagnosis not present

## 2019-02-26 DIAGNOSIS — I10 Essential (primary) hypertension: Secondary | ICD-10-CM | POA: Diagnosis not present

## 2019-02-26 DIAGNOSIS — E119 Type 2 diabetes mellitus without complications: Secondary | ICD-10-CM | POA: Diagnosis not present

## 2019-02-26 DIAGNOSIS — M532X6 Spinal instabilities, lumbar region: Secondary | ICD-10-CM | POA: Diagnosis not present

## 2019-02-26 HISTORY — PX: BACK SURGERY: SHX140

## 2019-03-02 DIAGNOSIS — M48062 Spinal stenosis, lumbar region with neurogenic claudication: Secondary | ICD-10-CM | POA: Diagnosis not present

## 2019-03-02 DIAGNOSIS — M4316 Spondylolisthesis, lumbar region: Secondary | ICD-10-CM | POA: Diagnosis not present

## 2019-03-02 DIAGNOSIS — E119 Type 2 diabetes mellitus without complications: Secondary | ICD-10-CM | POA: Diagnosis not present

## 2019-03-02 DIAGNOSIS — Z86718 Personal history of other venous thrombosis and embolism: Secondary | ICD-10-CM | POA: Diagnosis not present

## 2019-03-02 DIAGNOSIS — K219 Gastro-esophageal reflux disease without esophagitis: Secondary | ICD-10-CM | POA: Diagnosis not present

## 2019-03-02 DIAGNOSIS — E7800 Pure hypercholesterolemia, unspecified: Secondary | ICD-10-CM | POA: Diagnosis not present

## 2019-03-02 DIAGNOSIS — Z4789 Encounter for other orthopedic aftercare: Secondary | ICD-10-CM | POA: Diagnosis not present

## 2019-03-02 DIAGNOSIS — I10 Essential (primary) hypertension: Secondary | ICD-10-CM | POA: Diagnosis not present

## 2019-03-02 DIAGNOSIS — Z981 Arthrodesis status: Secondary | ICD-10-CM | POA: Diagnosis not present

## 2019-03-04 ENCOUNTER — Ambulatory Visit: Payer: Medicare HMO | Admitting: Family Medicine

## 2019-03-06 ENCOUNTER — Ambulatory Visit (INDEPENDENT_AMBULATORY_CARE_PROVIDER_SITE_OTHER): Payer: Medicare HMO | Admitting: Pharmacist

## 2019-03-06 DIAGNOSIS — M4316 Spondylolisthesis, lumbar region: Secondary | ICD-10-CM | POA: Diagnosis not present

## 2019-03-06 DIAGNOSIS — E1121 Type 2 diabetes mellitus with diabetic nephropathy: Secondary | ICD-10-CM | POA: Diagnosis not present

## 2019-03-06 DIAGNOSIS — K219 Gastro-esophageal reflux disease without esophagitis: Secondary | ICD-10-CM | POA: Diagnosis not present

## 2019-03-06 DIAGNOSIS — Z4789 Encounter for other orthopedic aftercare: Secondary | ICD-10-CM | POA: Diagnosis not present

## 2019-03-06 DIAGNOSIS — M48062 Spinal stenosis, lumbar region with neurogenic claudication: Secondary | ICD-10-CM | POA: Diagnosis not present

## 2019-03-06 DIAGNOSIS — Z794 Long term (current) use of insulin: Secondary | ICD-10-CM

## 2019-03-06 DIAGNOSIS — E119 Type 2 diabetes mellitus without complications: Secondary | ICD-10-CM | POA: Diagnosis not present

## 2019-03-06 DIAGNOSIS — I10 Essential (primary) hypertension: Secondary | ICD-10-CM | POA: Diagnosis not present

## 2019-03-06 DIAGNOSIS — E7800 Pure hypercholesterolemia, unspecified: Secondary | ICD-10-CM | POA: Diagnosis not present

## 2019-03-06 DIAGNOSIS — Z981 Arthrodesis status: Secondary | ICD-10-CM | POA: Diagnosis not present

## 2019-03-06 DIAGNOSIS — Z86718 Personal history of other venous thrombosis and embolism: Secondary | ICD-10-CM | POA: Diagnosis not present

## 2019-03-06 NOTE — Chronic Care Management (AMB) (Signed)
Chronic Care Management   Follow Up Note   03/06/2019 Name: [name] MRN: [id number] DOB: May 30, 1950  Referred by: Daniel Cords, DO Reason for referral : Chronic Care Management (Patient Phone Call)   [name] is a 68 y.o. year old male who is a primary care patient of Daniel Cords, DO. The CCM team was consulted for assistance with chronic disease management and care coordination needs.  [name] has a past medical history including but not limited to type 2 diabetes mellitus, hypertension, hyperlipidemia, GERD, gout, seasonal allergies and chronic back pain.  I reached out to Berg Daniel and his wife by phone today.   Review of patient status, including review of consultants reports, relevant laboratory and other test results, and collaboration with appropriate care team members and the patient's provider was performed as part of comprehensive patient evaluation and provision of chronic care management services.     Outpatient Encounter Medications as of 03/06/2019  Medication Sig Note  . acetaminophen (TYLENOL) 500 MG tablet Take 1,000 mg by mouth every 8 (eight) hours as needed.   Marland Kitchen BYDUREON BCISE 2 MG/0.85ML AUIJ INJECT 2 MG INTO THE SKIN ONCE A WEEK. 11/20/2018: On Mondays  . docusate sodium (COLACE) 100 MG capsule Take 100 mg by mouth 2 (two) times daily.   . Insulin Glargine (LANTUS SOLOSTAR) 100 UNIT/ML Solostar Pen Inject 16 Units into the skin daily at 10 pm. (Patient taking differently: Inject 18 Units into the skin daily at 10 pm. )   . metFORMIN (GLUCOPHAGE-XR) 750 MG 24 hr tablet Take 2 tablets (1,500 mg total) by mouth daily with breakfast. May start with only 1 tab, and increase as tolerated. (Patient taking differently: Take 750 mg by mouth 2 (two) times daily. )   . oxyCODONE (OXY IR/ROXICODONE) 5 MG immediate release tablet Take 5 mg by mouth every 6 (six) hours as needed for severe pain.   Marland Kitchen ACCU-CHEK SMARTVIEW test strip  Check sugar 2 times daily   . allopurinol (ZYLOPRIM) 100 MG tablet Take 1 tablet (100 mg total) by mouth at bedtime.   Marland Kitchen amLODipine (NORVASC) 2.5 MG tablet TAKE 1 TABLET BY MOUTH AT BEDTIME   . calcium carbonate (TUMS - DOSED IN MG ELEMENTAL CALCIUM) 500 MG chewable tablet Chew 2 tablets by mouth at bedtime as needed for indigestion or heartburn.    . colchicine 0.6 MG tablet Take 1 tablet (0.6 mg total) by mouth as needed (gout flare).   . fluticasone (FLONASE) 50 MCG/ACT nasal spray USE 2 SPRAY(S) IN EACH NOSTRIL ONCE DAILY USE FOR 4-6 WEEKS THEN STOP AND USE SEASONALLY OR AS NEEDED   . Insulin Pen Needle (NOVOFINE PLUS) 32G X 4 MM MISC Use with Lantus insulin daily injection as instructed   . lisinopril (PRINIVIL,ZESTRIL) 40 MG tablet Take 1 tablet (40 mg total) by mouth daily.   Marland Kitchen omeprazole (PRILOSEC) 20 MG capsule Take 1 capsule (20 mg total) by mouth daily.   . [DISCONTINUED] ciprofloxacin (CIPRO) 500 MG tablet ciprofloxacin 500 mg tablet  TAKE 1 TABLET BY MOUTH TWICE DAILY FOR 7 DAYS    No facility-administered encounter medications on file as of 03/06/2019.     Goals Addressed            This Visit's Progress   . PharmD - "He's in the donut hole" (pt-stated)       Current Barriers:  . Financial Barriers - Patient currently in coverage gap of Part D plan .  Lack of blood sugar results for clinical team  Pharmacist Clinical Goal(s):  Marland Kitchen Over the next 30 days, patient will work with CM Pharmacist and PCP to address needs related to medication assistance and diabetes management  Interventions: . Reports patient had back surgery as planned on 12/1. Reports surgery went well. States currently sore, but strength improving o Receiving home PT twice weekly x 2 weeks o Post-surgery follow up visit scheduled for 12/16 o Caution for dizziness/sedation with use of oxycodone. Verbalizes understanding. Admits to sedation and constipation right after surgery, but reports has improved   Counsel patient on importance of blood sugar control and monitoring ? Reports taking:  Bydureon 2 mg weekly  Lantus 18 units daily  Metformin ER 750 mg BID ? Denies having checked CBGs since home from surgery  Patient checks now, CBG: 218 (~1 hour after lunch)  Counsel on importance of restarting monitoring ? Encourage patient to continue to focus on eating regular and balanced meals . Counsel regarding medication assistance application process for 2021 o Patient expresses interest in switching to alternative medications in therapeutic class for both Lantus and Bydureon in order to be eligible to apply for patient assistance earlier in calendar year  Patient Self Care Activities:  . Self administers medications as prescribed with assistance from his wife o Uses weekly pillbox as adherence tool . Attends all scheduled provider appointments . Calls pharmacy for medication refills . Calls provider office for new concerns or questions . To check blood sugars twice daily and keep log  Please see past updates related to this goal by clicking on the "Past Updates" button in the selected goal         Plan  Telephone follow up appointment with care management team member scheduled for: 12/17 at 1 pm  [name], PharmD, [place] [phone]

## 2019-03-06 NOTE — Patient Instructions (Signed)
Thank you allowing the Chronic Care Management Team to be a part of your care! It was a pleasure speaking with you today!     CCM (Chronic Care Management) Team    Janci Minor RN, BSN Nurse Care Coordinator  [phone]   Harlow Asa PharmD  Clinical Pharmacist  [phone]   Eula Fried LCSW Clinical Social Worker [phone]  Visit Information  Goals Addressed            This Visit's Progress   . PharmD - "He's in the donut hole" (pt-stated)       Current Barriers:  . Financial Barriers - Patient currently in coverage gap of Part D plan . Lack of blood sugar results for clinical team  Pharmacist Clinical Goal(s):  Marland Kitchen Over the next 30 days, patient will work with CM Pharmacist and PCP to address needs related to medication assistance and diabetes management  Interventions: . Reports patient had back surgery as planned on 12/1. Reports surgery went well. States currently sore, but strength improving o Receiving home PT twice weekly x 2 weeks o Post-surgery follow up visit scheduled for 12/16 o Caution for dizziness/sedation with use of oxycodone. Verbalizes understanding. Admits to sedation and constipation right after surgery, but reports has improved  Counsel patient on importance of blood sugar control and monitoring ? Reports taking:  Bydureon 2 mg weekly  Lantus 18 units daily  Metformin ER 750 mg BID ? Denies having checked CBGs since home from surgery  Patient checks now, CBG: 218 (~1 hour after lunch)  Counsel on importance of restarting monitoring ? Encourage patient to continue to focus on eating regular and balanced meals . Counsel regarding medication assistance application process for 2021 o Patient expresses interest in switching to alternative medications in therapeutic class for both Lantus and Bydureon in order to be eligible to apply for patient assistance earlier in calendar year  Patient Self Care Activities:  . Self  administers medications as prescribed with assistance from his wife o Uses weekly pillbox as adherence tool . Attends all scheduled provider appointments . Calls pharmacy for medication refills . Calls provider office for new concerns or questions . To check blood sugars twice daily and keep log  Please see past updates related to this goal by clicking on the "Past Updates" button in the selected goal         The patient verbalized understanding of instructions provided today and declined a print copy of patient instruction materials.   Telephone follow up appointment with care management team member scheduled for: 12/17 at 1 pm  Harlow Asa, PharmD, [place] [phone]

## 2019-03-08 DIAGNOSIS — Z4789 Encounter for other orthopedic aftercare: Secondary | ICD-10-CM | POA: Diagnosis not present

## 2019-03-08 DIAGNOSIS — E7800 Pure hypercholesterolemia, unspecified: Secondary | ICD-10-CM | POA: Diagnosis not present

## 2019-03-08 DIAGNOSIS — I10 Essential (primary) hypertension: Secondary | ICD-10-CM | POA: Diagnosis not present

## 2019-03-08 DIAGNOSIS — Z86718 Personal history of other venous thrombosis and embolism: Secondary | ICD-10-CM | POA: Diagnosis not present

## 2019-03-08 DIAGNOSIS — E119 Type 2 diabetes mellitus without complications: Secondary | ICD-10-CM | POA: Diagnosis not present

## 2019-03-08 DIAGNOSIS — K219 Gastro-esophageal reflux disease without esophagitis: Secondary | ICD-10-CM | POA: Diagnosis not present

## 2019-03-08 DIAGNOSIS — Z981 Arthrodesis status: Secondary | ICD-10-CM | POA: Diagnosis not present

## 2019-03-08 DIAGNOSIS — M4316 Spondylolisthesis, lumbar region: Secondary | ICD-10-CM | POA: Diagnosis not present

## 2019-03-08 DIAGNOSIS — M48062 Spinal stenosis, lumbar region with neurogenic claudication: Secondary | ICD-10-CM | POA: Diagnosis not present

## 2019-03-11 DIAGNOSIS — E119 Type 2 diabetes mellitus without complications: Secondary | ICD-10-CM | POA: Diagnosis not present

## 2019-03-11 DIAGNOSIS — E7800 Pure hypercholesterolemia, unspecified: Secondary | ICD-10-CM | POA: Diagnosis not present

## 2019-03-11 DIAGNOSIS — M48062 Spinal stenosis, lumbar region with neurogenic claudication: Secondary | ICD-10-CM | POA: Diagnosis not present

## 2019-03-11 DIAGNOSIS — Z86718 Personal history of other venous thrombosis and embolism: Secondary | ICD-10-CM | POA: Diagnosis not present

## 2019-03-11 DIAGNOSIS — Z981 Arthrodesis status: Secondary | ICD-10-CM | POA: Diagnosis not present

## 2019-03-11 DIAGNOSIS — Z4789 Encounter for other orthopedic aftercare: Secondary | ICD-10-CM | POA: Diagnosis not present

## 2019-03-11 DIAGNOSIS — I10 Essential (primary) hypertension: Secondary | ICD-10-CM | POA: Diagnosis not present

## 2019-03-11 DIAGNOSIS — K219 Gastro-esophageal reflux disease without esophagitis: Secondary | ICD-10-CM | POA: Diagnosis not present

## 2019-03-11 DIAGNOSIS — M4316 Spondylolisthesis, lumbar region: Secondary | ICD-10-CM | POA: Diagnosis not present

## 2019-03-13 DIAGNOSIS — M4316 Spondylolisthesis, lumbar region: Secondary | ICD-10-CM | POA: Diagnosis not present

## 2019-03-13 DIAGNOSIS — K219 Gastro-esophageal reflux disease without esophagitis: Secondary | ICD-10-CM | POA: Diagnosis not present

## 2019-03-13 DIAGNOSIS — Z4789 Encounter for other orthopedic aftercare: Secondary | ICD-10-CM | POA: Diagnosis not present

## 2019-03-13 DIAGNOSIS — I10 Essential (primary) hypertension: Secondary | ICD-10-CM | POA: Diagnosis not present

## 2019-03-13 DIAGNOSIS — Z981 Arthrodesis status: Secondary | ICD-10-CM | POA: Diagnosis not present

## 2019-03-13 DIAGNOSIS — Z86718 Personal history of other venous thrombosis and embolism: Secondary | ICD-10-CM | POA: Diagnosis not present

## 2019-03-13 DIAGNOSIS — M48062 Spinal stenosis, lumbar region with neurogenic claudication: Secondary | ICD-10-CM | POA: Diagnosis not present

## 2019-03-13 DIAGNOSIS — E7800 Pure hypercholesterolemia, unspecified: Secondary | ICD-10-CM | POA: Diagnosis not present

## 2019-03-13 DIAGNOSIS — E119 Type 2 diabetes mellitus without complications: Secondary | ICD-10-CM | POA: Diagnosis not present

## 2019-03-14 ENCOUNTER — Ambulatory Visit: Payer: Self-pay | Admitting: Pharmacist

## 2019-03-14 DIAGNOSIS — E7800 Pure hypercholesterolemia, unspecified: Secondary | ICD-10-CM | POA: Diagnosis not present

## 2019-03-14 DIAGNOSIS — K219 Gastro-esophageal reflux disease without esophagitis: Secondary | ICD-10-CM | POA: Diagnosis not present

## 2019-03-14 DIAGNOSIS — Z86718 Personal history of other venous thrombosis and embolism: Secondary | ICD-10-CM | POA: Diagnosis not present

## 2019-03-14 DIAGNOSIS — I10 Essential (primary) hypertension: Secondary | ICD-10-CM | POA: Diagnosis not present

## 2019-03-14 DIAGNOSIS — M4316 Spondylolisthesis, lumbar region: Secondary | ICD-10-CM | POA: Diagnosis not present

## 2019-03-14 DIAGNOSIS — E119 Type 2 diabetes mellitus without complications: Secondary | ICD-10-CM | POA: Diagnosis not present

## 2019-03-14 DIAGNOSIS — Z981 Arthrodesis status: Secondary | ICD-10-CM | POA: Diagnosis not present

## 2019-03-14 DIAGNOSIS — M48062 Spinal stenosis, lumbar region with neurogenic claudication: Secondary | ICD-10-CM | POA: Diagnosis not present

## 2019-03-14 DIAGNOSIS — Z4789 Encounter for other orthopedic aftercare: Secondary | ICD-10-CM | POA: Diagnosis not present

## 2019-03-14 DIAGNOSIS — E1121 Type 2 diabetes mellitus with diabetic nephropathy: Secondary | ICD-10-CM

## 2019-03-14 NOTE — Chronic Care Management (AMB) (Signed)
Chronic Care Management   Follow Up Note   03/14/2019 Name: [name] MRN: [id number] DOB: April 30, 1950  Referred by: [name], DO Reason for referral : Chronic Care Management (Patient Phone Call)   [name] is a 68 y.o. year old male who is a primary care patient of [name], DO. The CCM team was consulted for assistance with chronic disease management and care coordination needs.  [name] has a past medical history including but not limited to type 2 diabetes mellitus, hypertension, hyperlipidemia, GERD, gout, seasonal allergies and chronic back pain.  I reached out to [name] by phone today.   Review of patient status, including review of consultants reports, relevant laboratory and other test results, and collaboration with appropriate care team members and the patient's provider was performed as part of comprehensive patient evaluation and provision of chronic care management services.      Outpatient Encounter Medications as of 03/14/2019  Medication Sig Note  . BYDUREON BCISE 2 MG/0.85ML AUIJ INJECT 2 MG INTO THE SKIN ONCE A WEEK. 11/20/2018: On Mondays  . Insulin Glargine (LANTUS SOLOSTAR) 100 UNIT/ML Solostar Pen Inject 16 Units into the skin daily at 10 pm. (Patient taking differently: Inject 18 Units into the skin daily at 10 pm. )   . metFORMIN (GLUCOPHAGE-XR) 750 MG 24 hr tablet Take 2 tablets (1,500 mg total) by mouth daily with breakfast. May start with only 1 tab, and increase as tolerated. (Patient taking differently: Take 750 mg by mouth 2 (two) times daily. )   . ACCU-CHEK SMARTVIEW test strip Check sugar 2 times daily   . acetaminophen (TYLENOL) 500 MG tablet Take 1,000 mg by mouth every 8 (eight) hours as needed.   Marland Kitchen allopurinol (ZYLOPRIM) 100 MG tablet Take 1 tablet (100 mg total) by mouth at bedtime.   Marland Kitchen amLODipine (NORVASC) 2.5 MG tablet TAKE 1 TABLET BY MOUTH AT BEDTIME   . calcium carbonate (TUMS - DOSED  IN MG ELEMENTAL CALCIUM) 500 MG chewable tablet Chew 2 tablets by mouth at bedtime as needed for indigestion or heartburn.    . colchicine 0.6 MG tablet Take 1 tablet (0.6 mg total) by mouth as needed (gout flare).   Marland Kitchen docusate sodium (COLACE) 100 MG capsule Take 100 mg by mouth 2 (two) times daily.   . fluticasone (FLONASE) 50 MCG/ACT nasal spray USE 2 SPRAY(S) IN EACH NOSTRIL ONCE DAILY USE FOR 4-6 WEEKS THEN STOP AND USE SEASONALLY OR AS NEEDED   . Insulin Pen Needle (NOVOFINE PLUS) 32G X 4 MM MISC Use with Lantus insulin daily injection as instructed   . lisinopril (PRINIVIL,ZESTRIL) 40 MG tablet Take 1 tablet (40 mg total) by mouth daily.   Marland Kitchen omeprazole (PRILOSEC) 20 MG capsule Take 1 capsule (20 mg total) by mouth daily.   Marland Kitchen oxyCODONE (OXY IR/ROXICODONE) 5 MG immediate release tablet Take 5 mg by mouth every 6 (six) hours as needed for severe pain.    No facility-administered encounter medications on file as of 03/14/2019.    Goals Addressed   Current Barriers:  . Financial Barriers - Patient currently in coverage gap of Part D plan  Pharmacist Clinical Goal(s):  Marland Kitchen Over the next 30 days, patient will work with CM Pharmacist and PCP to address needs related to medication assistance and diabetes management  Interventions: . Reports post-operative visit yesterday (for back surgery on 12/1) went well.  o Still receiving home PT (arrives during our call) o Reports using oxycodone  less frequently now  Counsel patient on importance of blood sugar control and monitoring ? Reports taking:  Bydureon 2 mg weekly  Lantus 18 units daily  Metformin ER 750 mg BID ? Review recent CBGs (see below)  Attributes higher readings to eating ice cream sandwiches with taking his pain medication  Will collaborate with PCP regarding diabetes management/medication assistance application process for 2021 o Patient has expressed interest in switching to alternative medications in therapeutic class for  both Lantus and Bydureon in order to be eligible to apply for patient assistance earlier in calendar year  Patient Self Care Activities:  . Self administers medications as prescribed with assistance from his wife o Uses weekly pillbox as adherence tool . Attends all scheduled provider appointments . Calls pharmacy for medication refills . Calls provider office for new concerns or questions . To check blood sugars twice daily and keep log Date Fasting Blood Glucose ~30 minutes after Breakfast ~1 hour after Supper Notes  12 - December 201  189   13 - December 140  201* Jackquline Bosch Sandwiches  14 - December 164  178   15 - December 138  174   16 - December 135  185   17 - December  160      Please see past updates related to this goal by clicking on the "Past Updates" button in the selected goal       Plan  Telephone follow up appointment with care management team member scheduled for: 1/4 at 1 pm  Harlow Asa, PharmD, [place] [phone]

## 2019-03-14 NOTE — Patient Instructions (Addendum)
Thank you allowing the Chronic Care Management Team to be a part of your care! It was a pleasure speaking with you today!     CCM (Chronic Care Management) Team    Janci Minor RN, BSN Nurse Care Coordinator  [phone]   Harlow Asa PharmD  Clinical Pharmacist  [phone]   Eula Fried LCSW Clinical Social Worker [phone]  Visit Information  Goals Addressed   Current Barriers:  Financial Barriers - Patient currently in coverage gap of Part D plan Pharmacist Clinical Goal(s):  Over the next 30 days, patient will work with CM Pharmacist and PCP to address needs related to medication assistance and diabetes management Interventions:  Reports post-operative visit yesterday (for back surgery on 12/1) went well.  Still receiving home PT (arrives during our call)  Reports using oxycodone less frequently now Counsel patient on importance of blood sugar control and monitoring  Reports taking:  Bydureon 2 mg weekly  Lantus 18 units daily  Metformin ER 750 mg BID Review recent CBGs (see below)  Attributes higher readings to eating ice cream sandwiches with taking his pain medication Will collaborate with PCP regarding diabetes management/medication assistance application process for 2021  Patient has expressed interest in switching to alternative medications in therapeutic class for both Lantus and Bydureon in order to be eligible to apply for patient assistance earlier in calendar year Patient Self Care Activities:  Self administers medications as prescribed with assistance from his wife  Uses weekly pillbox as adherence tool Attends all scheduled provider appointments  Calls pharmacy for medication refills  Calls provider office for new concerns or questions  To check blood sugars twice daily and keep log Date Fasting Blood Glucose ~30 minutes after Breakfast ~1 hour after Supper Notes  12 - December 201  189   13 - December 140  201* Jackquline Bosch Sandwiches  14  - December 164  178   15 - December 138  174   16 - December 135  185   17 - December  160    Please see past updates related to this goal by clicking on the "Past Updates" button in the selected goal     The patient verbalized understanding of instructions provided today and declined a print copy of patient instruction materials.   Telephone follow up appointment with care management team member scheduled for: 1/4 at 1 pm  Harlow Asa, PharmD, [place] [phone]

## 2019-03-19 IMAGING — US US EXTREM LOW VENOUS BILAT
1 series · 13 of 24 positions shown · non-contrast
Comparison: None.

CLINICAL DATA: 66-year-old male with a history of PE and lower
extremity DVT



[Series 1: us extrem low venous bilat · 13 of 65 slices shown]
[im 1/65]
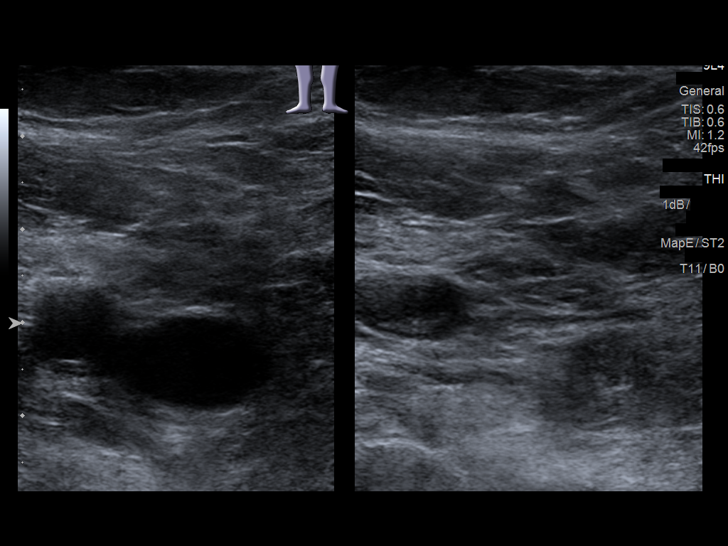
[im 6/65]
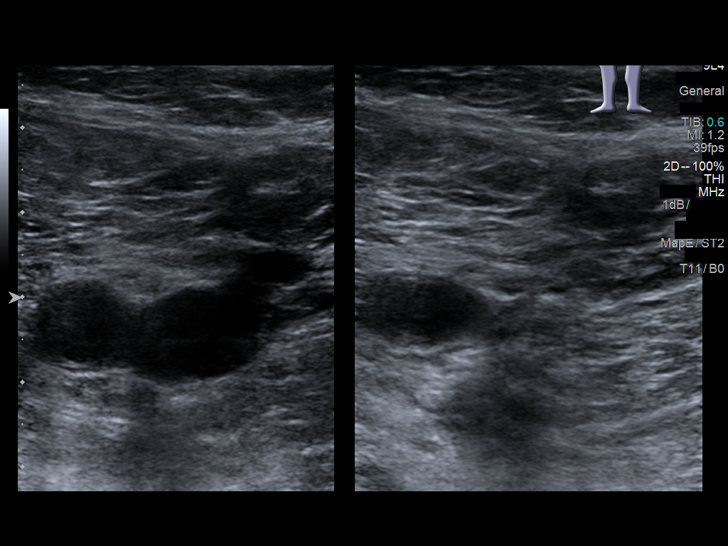
[im 12/65]
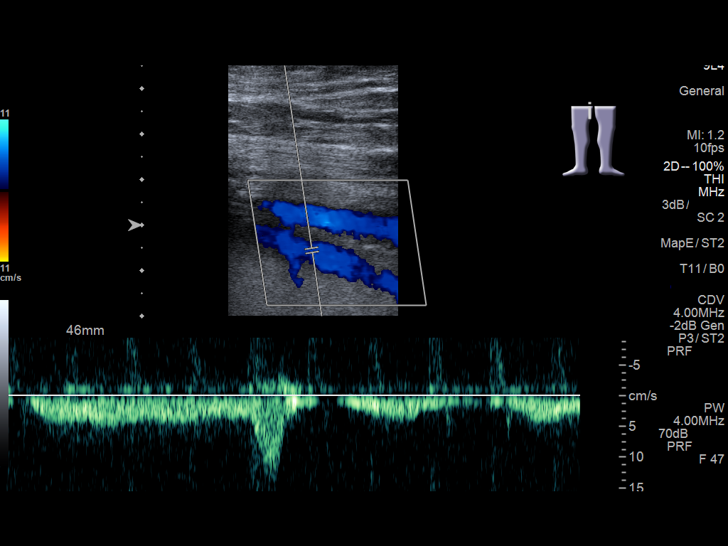
[im 17/65]
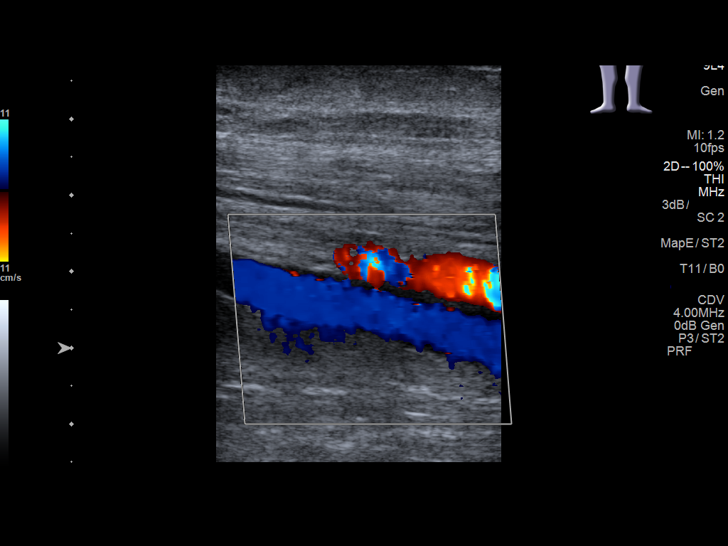
[im 23/65]
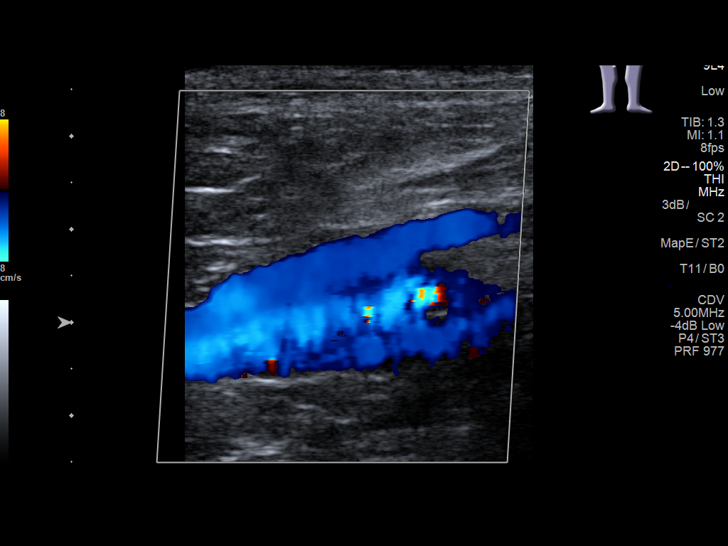
[im 28/65]
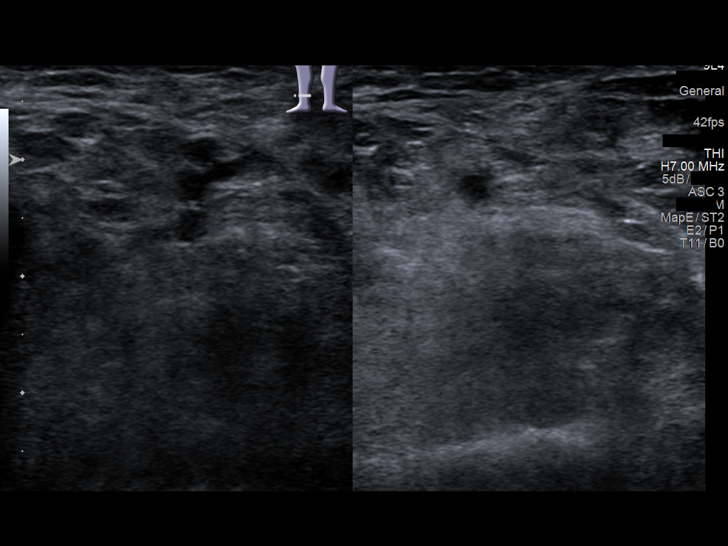
[im 34/65]
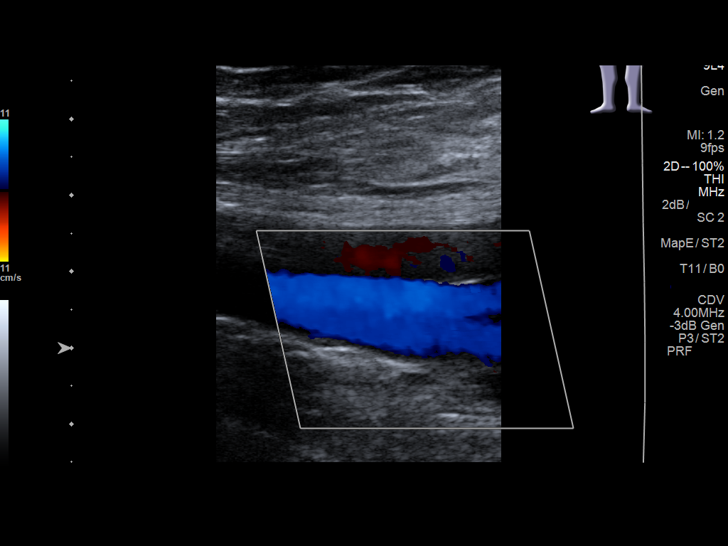
[im 37/65]
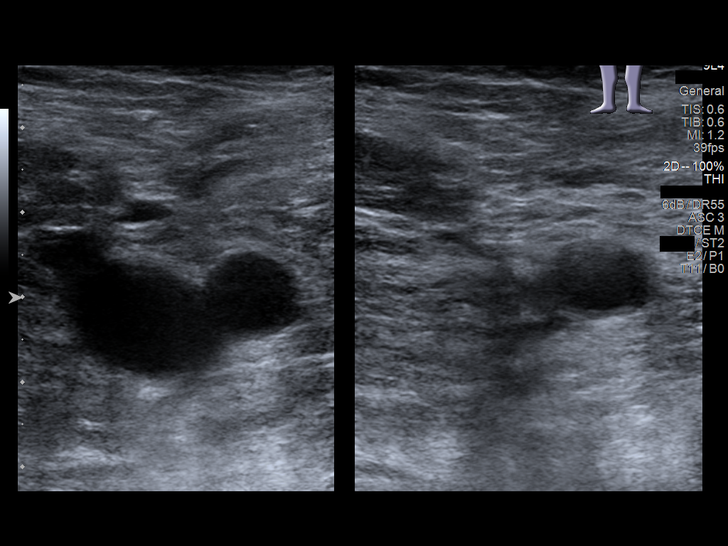
[im 42/65]
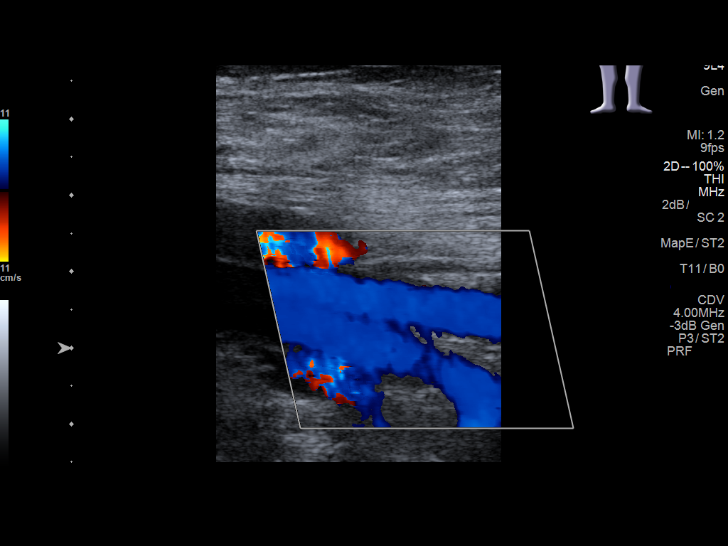
[im 48/65]
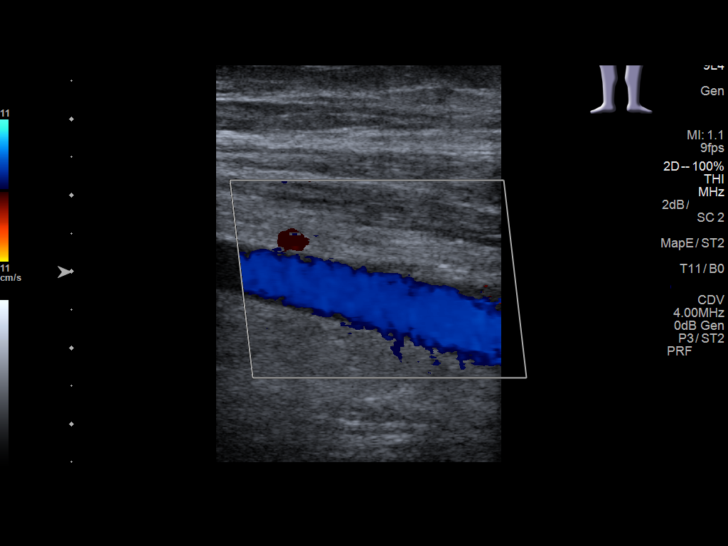
[im 53/65]
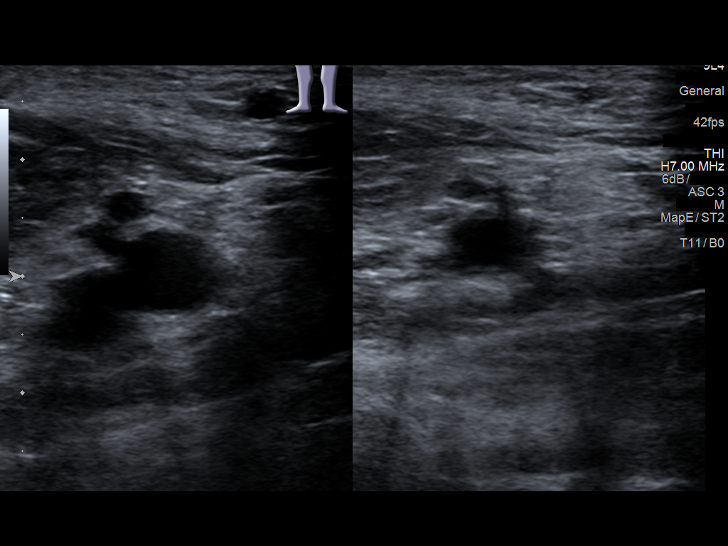
[im 59/65]
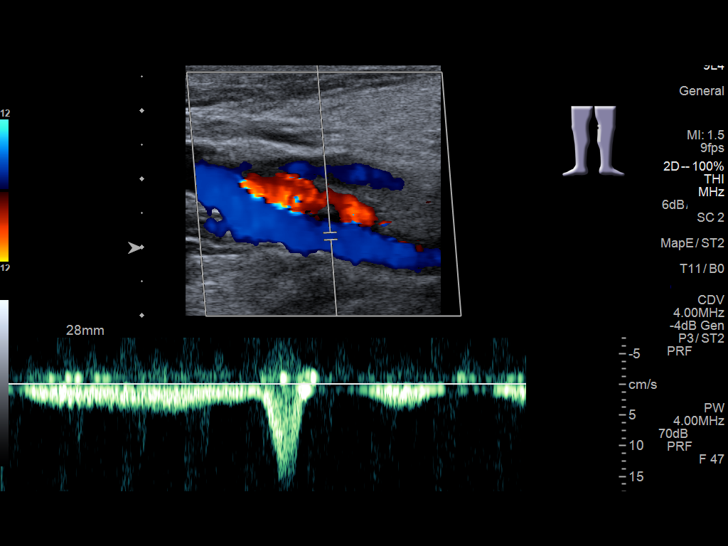
[im 65/65]
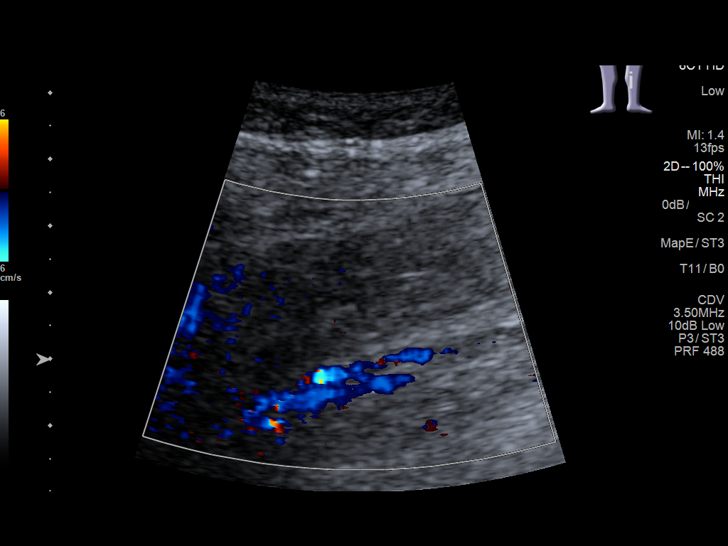

[13 of 24 positions shown; findings below may reference images not displayed]

FINDINGS: RIGHT LOWER EXTREMITY

Common Femoral Vein: No evidence of thrombus. Normal
compressibility, respiratory phasicity and response to augmentation.

Saphenofemoral Junction: No evidence of thrombus. Normal
compressibility and flow on color Doppler imaging.

Profunda Femoral Vein: No evidence of thrombus. Normal
compressibility and flow on color Doppler imaging.

Femoral Vein: No evidence of thrombus. Normal compressibility,
respiratory phasicity and response to augmentation.

Popliteal Vein: No evidence of thrombus. Normal compressibility,
respiratory phasicity and response to augmentation.

Calf Veins: No evidence of thrombus. Normal compressibility and flow
on color Doppler imaging.

Superficial Great Saphenous Vein: No evidence of thrombus. Normal
compressibility.

Venous Reflux:  None.

Other Findings:  None.

LEFT LOWER EXTREMITY

Common Femoral Vein: No evidence of thrombus. Normal
compressibility, respiratory phasicity and response to augmentation.

Saphenofemoral Junction: No evidence of thrombus. Normal
compressibility and flow on color Doppler imaging.

Profunda Femoral Vein: No evidence of thrombus. Normal
compressibility and flow on color Doppler imaging.

Femoral Vein: No evidence of thrombus. Normal compressibility,
respiratory phasicity and response to augmentation.

Popliteal Vein: No evidence of thrombus. Normal compressibility,
respiratory phasicity and response to augmentation.

Calf Veins: No evidence of thrombus. Normal compressibility and flow
on color Doppler imaging.

Superficial Great Saphenous Vein: No evidence of thrombus. Normal
compressibility.

Venous Reflux:  None.

Other Findings:  None.
IMPRESSION: No evidence of deep venous thrombosis.

## 2019-04-01 ENCOUNTER — Ambulatory Visit (INDEPENDENT_AMBULATORY_CARE_PROVIDER_SITE_OTHER): Payer: Medicare HMO | Admitting: Pharmacist

## 2019-04-01 DIAGNOSIS — E1121 Type 2 diabetes mellitus with diabetic nephropathy: Secondary | ICD-10-CM | POA: Diagnosis not present

## 2019-04-01 DIAGNOSIS — Z794 Long term (current) use of insulin: Secondary | ICD-10-CM

## 2019-04-01 DIAGNOSIS — I10 Essential (primary) hypertension: Secondary | ICD-10-CM | POA: Diagnosis not present

## 2019-04-01 NOTE — Chronic Care Management (AMB) (Signed)
Chronic Care Management   Follow Up Note   04/01/2019 Name: [name] MRN: [id number] DOB: 1951/01/27  Referred by: [name], DO Reason for referral : Chronic Care Management (Patient Phone Call)   [name] is a 69 y.o. year old male who is a primary care patient of [name], DO. The CCM team was consulted for assistance with chronic disease management and care coordination needs.  [name] has a past medical history including but not limited to type 2 diabetes mellitus, hypertension, hyperlipidemia, GERD, gout, seasonal allergies and chronic back pain.  I reached out to [name] and his wife by phone today.   Review of patient status, including review of consultants reports, relevant laboratory and other test results, and collaboration with appropriate care team members and the patient's provider was performed as part of comprehensive patient evaluation and provision of chronic care management services.     Outpatient Encounter Medications as of 04/01/2019  Medication Sig Note  . acetaminophen (TYLENOL) 500 MG tablet Take 1,000 mg by mouth every 8 (eight) hours as needed.   Marland Kitchen amLODipine (NORVASC) 2.5 MG tablet TAKE 1 TABLET BY MOUTH AT BEDTIME   . BYDUREON BCISE 2 MG/0.85ML AUIJ INJECT 2 MG INTO THE SKIN ONCE A WEEK. 11/20/2018: On Mondays  . Insulin Glargine (LANTUS SOLOSTAR) 100 UNIT/ML Solostar Pen Inject 16 Units into the skin daily at 10 pm. (Patient taking differently: Inject 18 Units into the skin daily at 10 pm. )   . lisinopril (PRINIVIL,ZESTRIL) 40 MG tablet Take 1 tablet (40 mg total) by mouth daily.   . metFORMIN (GLUCOPHAGE-XR) 750 MG 24 hr tablet Take 2 tablets (1,500 mg total) by mouth daily with breakfast. May start with only 1 tab, and increase as tolerated. (Patient taking differently: Take 750 mg by mouth 2 (two) times daily. )   . ACCU-CHEK SMARTVIEW test strip Check sugar 2 times daily   . allopurinol (ZYLOPRIM)  100 MG tablet Take 1 tablet (100 mg total) by mouth at bedtime.   . calcium carbonate (TUMS - DOSED IN MG ELEMENTAL CALCIUM) 500 MG chewable tablet Chew 2 tablets by mouth at bedtime as needed for indigestion or heartburn.    . colchicine 0.6 MG tablet Take 1 tablet (0.6 mg total) by mouth as needed (gout flare).   Marland Kitchen docusate sodium (COLACE) 100 MG capsule Take 100 mg by mouth 2 (two) times daily.   . fluticasone (FLONASE) 50 MCG/ACT nasal spray USE 2 SPRAY(S) IN EACH NOSTRIL ONCE DAILY USE FOR 4-6 WEEKS THEN STOP AND USE SEASONALLY OR AS NEEDED   . Insulin Pen Needle (NOVOFINE PLUS) 32G X 4 MM MISC Use with Lantus insulin daily injection as instructed   . omeprazole (PRILOSEC) 20 MG capsule Take 1 capsule (20 mg total) by mouth daily.   . [DISCONTINUED] oxyCODONE (OXY IR/ROXICODONE) 5 MG immediate release tablet Take 5 mg by mouth every 6 (six) hours as needed for severe pain.    No facility-administered encounter medications on file as of 04/01/2019.    Goals Addressed            This Visit's Progress   . PharmD - Diabetes Management (pt-stated)       Current Barriers:  . Financial Barriers - patient has [employer] and reports copay for Bydureon & Lantus is cost prohibitive at this time  Pharmacist Clinical Goal(s):  Marland Kitchen Over the next 30 days, patient will work with [employer] Pharmacist and PCP  to address needs related to medication assistance and diabetes management  Interventions: . Reports he has completed his post-surgery PT (back surgery on 12/1).  o Still doing exercises as provided by PT and slowly increasing walking distance as directed o Reports pain improved and no longer taking oxycodone  Counsel patient on importance of blood sugar control and monitoring ? Reports taking:  Bydureon 2 mg once weekly  Lantus (insulin glargine) 18 units daily  Metformin ER 750 mg BID ? Review recent CBGs (see below)  Reports checking CBGs less frequently over the  holidays  Denies any low blood sugars ? Counsel patient on symptoms of low blood sugar and advise patient to  following titration instruction from PCP for any adjustment of Lantus dose  Counsel on importance of blood pressure monitoring and control ? Reports taking:  Amlodipine 2.5 mg once daily  Lisinopril 40 mg daily ? Reports BP was "good" when checked by [facility] PT  Counsel patient and wife of medication assistance options for 2021 o Based on reported income, patient meets requirement for [name] (insulin glargine) patient assistance through [employer] o Patient previously approved for patient assistance for Bydureon through [employer] for 2020 calendar year - Advise patient and wife on Re-enrollment process now available through [name] for Medicare patients . Verbally walk Mrs. Thornhill through accessing and the online patient attestation form. . Will collaborate with [place] CPhT Pattricia Boss for assistance with aiding patient with: o Applying for [name] patient assistance through [employer] o Confirming patient's re-enrollment with [name] for Bydureon for 2021  Patient Self Care Activities:  . Self administers medications as prescribed with assistance from his wife o Uses weekly pillbox as adherence tool . Attends all scheduled provider appointments . Calls pharmacy for medication refills . Calls provider office for new concerns or questions . To check blood sugars twice daily and keep log Date Fasting Blood Glucose ~1 hour after Supper Notes  29 - December 190* 153 *Sweets day before  30 - December 114 153   31 - December - 137   1 - January - -   2 - January - 188   3 - January - 202* *Had fruitcake prior to reading  4 - January 157      Please see past updates related to this goal by clicking on the "Past Updates" button in the selected goal         Plan  Telephone follow up appointment with care management team member scheduled for: 2/1 at 2 pm  [name],  PharmD, [facility] Clinical Pharmacist [facility] Medical [employer] [phone]

## 2019-04-01 NOTE — Patient Instructions (Signed)
Thank you allowing the Chronic Care Management Team to be a part of your care! It was a pleasure speaking with you today!     CCM (Chronic Care Management) Team    Janci Minor RN, BSN Nurse Care Coordinator  [phone]   [name] PharmD  Clinical Pharmacist  [phone]   Dickie La LCSW Clinical Social Worker [phone]  Visit Information  Goals Addressed            This Visit's Progress   . PharmD - Diabetes Management (pt-stated)       Current Barriers:  . Financial Barriers - patient has [employer] and reports copay for Bydureon & Lantus is cost prohibitive at this time  Pharmacist Clinical Goal(s):  Marland Kitchen Over the next 30 days, patient will work with CM Pharmacist and PCP to address needs related to medication assistance and diabetes management  Interventions: . Reports he has completed his post-surgery PT (back surgery on 12/1).  o Still doing exercises as provided by PT and slowly increasing walking distance as directed o Reports pain improved and no longer taking oxycodone  Counsel patient on importance of blood sugar control and monitoring ? Reports taking:  Bydureon 2 mg once weekly  Lantus (insulin glargine) 18 units daily  Metformin ER 750 mg BID ? Review recent CBGs (see below)  Reports checking CBGs less frequently over the holidays  Denies any low blood sugars ? Counsel patient on symptoms of low blood sugar and advise patient to  following titration instruction from PCP for any adjustment of Lantus dose  Counsel on importance of blood pressure monitoring and control ? Reports taking:  Amlodipine 2.5 mg once daily  Lisinopril 40 mg daily ? Reports BP was "good" when checked by [facility] PT  Counsel patient and wife of medication assistance options for 2021 o Based on reported income, patient meets requirement for [name] (insulin glargine) patient assistance through [employer] o Patient previously approved for  patient assistance for Bydureon through [employer] for 2020 calendar year - Advise patient and wife on Re-enrollment process now available through [name] for Medicare patients . Verbally walk Mrs. Cegielski through accessing and the online patient attestation form. . Will collaborate with [facility] CPhT Pattricia Boss for assistance with aiding patient with: o Applying for [name] patient assistance through [employer] o Confirming patient's re-enrollment with [name] for Bydureon for 2021  Patient Self Care Activities:  . Self administers medications as prescribed with assistance from his wife o Uses weekly pillbox as adherence tool . Attends all scheduled provider appointments . Calls pharmacy for medication refills . Calls provider office for new concerns or questions . To check blood sugars twice daily and keep log Date Fasting Blood Glucose ~1 hour after Supper Notes  29 - December 190* 153 *Sweets day before  30 - December 114 153   31 - December - 137   1 - January - -   2 - January - 188   3 - January - 202* *Had fruitcake prior to reading  4 - January 157      Please see past updates related to this goal by clicking on the "Past Updates" button in the selected goal         The patient verbalized understanding of instructions provided today and declined a print copy of patient instruction materials.   Telephone follow up appointment with care management team member scheduled for: 2/1 at 2 pm  [name], PharmD, [facility] Clinical Pharmacist  [place] [employer] [phone]

## 2019-04-03 ENCOUNTER — Other Ambulatory Visit: Payer: Self-pay | Admitting: Pharmacy Technician

## 2019-04-03 NOTE — Patient Outreach (Signed)
[facility]) Care Management  04/03/2019  [name] [birth year] [id number]                                       Medication Assistance Referral  Referral From: [facility] Embedded RPh Vallery Sa   Medication/Company: Mariella Saa / [name] Patient application portion:  Mailed 04/04/2019 Provider application portion: Faxed  to Dr. Althea Charon 04/04/2019 Provider address/fax verified via: Office website   Follow up:  Will follow up with patient in 5-15 business days to confirm application(s) have been received.  Regine Christian P. Aydn Ferrara, CPhT Musician Care Management [phone]

## 2019-04-08 ENCOUNTER — Other Ambulatory Visit: Payer: Medicare HMO

## 2019-04-10 ENCOUNTER — Other Ambulatory Visit: Payer: Self-pay | Admitting: Pharmacy Technician

## 2019-04-10 NOTE — Patient Outreach (Signed)
[facility]) Care Management  04/10/2019  [name] [birth year] [id number]   Care coordination call placed to [name] in regards to patient's re enrollment application for Bydureon Bcise.  Spoke to [place] who informed patient has been APPROVED 03/29/2019-03/27/2020. She informed that a prescription needs to be faxed to them that says Bydureon Bcise and not just Bydureon. She informed that information could be faxed to them at [phone].  Will route note to embedded [facility] RPh [name] for assistance. Will follow up with [name] in 5-7 business days to inquire that information was received and to inquire about a delivery update.  [name], CPhT Musician Care Management [phone]

## 2019-04-12 ENCOUNTER — Other Ambulatory Visit: Payer: Self-pay | Admitting: Pharmacy Technician

## 2019-04-12 ENCOUNTER — Ambulatory Visit: Payer: Self-pay | Admitting: Pharmacist

## 2019-04-12 ENCOUNTER — Other Ambulatory Visit: Payer: Self-pay | Admitting: Family Medicine

## 2019-04-12 DIAGNOSIS — E1165 Type 2 diabetes mellitus with hyperglycemia: Secondary | ICD-10-CM

## 2019-04-12 DIAGNOSIS — IMO0002 Reserved for concepts with insufficient information to code with codable children: Secondary | ICD-10-CM

## 2019-04-12 DIAGNOSIS — E1121 Type 2 diabetes mellitus with diabetic nephropathy: Secondary | ICD-10-CM

## 2019-04-12 MED ORDER — BYDUREON BCISE 2 MG/0.85ML ~~LOC~~ AUIJ: 2.0000 mg | AUTO-INJECTOR | SUBCUTANEOUS | 2 refills | Status: DC

## 2019-04-12 NOTE — Chronic Care Management (AMB) (Signed)
Chronic Care Management   Follow Up Note   04/12/2019 Name: [name] MRN: [id number] DOB: February 29, 1951  Referred by: [name], DO Reason for referral : Care Coordination (Patient Assistance Application)   [name] is a 69 y.o. year old male who is a primary care patient of [name], DO. The CCM team was consulted for assistance with chronic disease management and care coordination needs.    Coordination of care with [facility] CPhT and PCP office.  Review of patient status, including review of consultants reports, relevant laboratory and other test results, and collaboration with appropriate care team members and the patient's provider was performed as part of comprehensive patient evaluation and provision of chronic care management services.      Outpatient Encounter Medications as of 04/12/2019  Medication Sig  . ACCU-CHEK SMARTVIEW test strip Check sugar 2 times daily  . acetaminophen (TYLENOL) 500 MG tablet Take 1,000 mg by mouth every 8 (eight) hours as needed.  Marland Kitchen allopurinol (ZYLOPRIM) 100 MG tablet Take 1 tablet (100 mg total) by mouth at bedtime.  Marland Kitchen amLODipine (NORVASC) 2.5 MG tablet TAKE 1 TABLET BY MOUTH AT BEDTIME  . BYDUREON BCISE 2 MG/0.85ML AUIJ Inject 2 mg as directed once a week.  . calcium carbonate (TUMS - DOSED IN MG ELEMENTAL CALCIUM) 500 MG chewable tablet Chew 2 tablets by mouth at bedtime as needed for indigestion or heartburn.   . colchicine 0.6 MG tablet Take 1 tablet (0.6 mg total) by mouth as needed (gout flare).  Marland Kitchen docusate sodium (COLACE) 100 MG capsule Take 100 mg by mouth 2 (two) times daily.  . fluticasone (FLONASE) 50 MCG/ACT nasal spray USE 2 SPRAY(S) IN EACH NOSTRIL ONCE DAILY USE FOR 4-6 WEEKS THEN STOP AND USE SEASONALLY OR AS NEEDED  . Insulin Glargine (LANTUS SOLOSTAR) 100 UNIT/ML Solostar Pen Inject 16 Units into the skin daily at 10 pm. (Patient taking differently: Inject 18 Units into the skin daily at 10  pm. )  . Insulin Pen Needle (NOVOFINE PLUS) 32G X 4 MM MISC Use with Lantus insulin daily injection as instructed  . lisinopril (PRINIVIL,ZESTRIL) 40 MG tablet Take 1 tablet (40 mg total) by mouth daily.  . metFORMIN (GLUCOPHAGE-XR) 750 MG 24 hr tablet Take 2 tablets (1,500 mg total) by mouth daily with breakfast. May start with only 1 tab, and increase as tolerated. (Patient taking differently: Take 750 mg by mouth 2 (two) times daily. )  . omeprazole (PRILOSEC) 20 MG capsule Take 1 capsule (20 mg total) by mouth daily.   No facility-administered encounter medications on file as of 04/12/2019.    Goals Addressed            This Visit's Progress   . PharmD - Diabetes Management (pt-stated)       Current Barriers:  . Financial Barriers - patient has [employer] and reports copay for Bydureon & Lantus is cost prohibitive at this time  Pharmacist Clinical Goal(s):  Marland Kitchen Over the next 30 days, patient will work with CM Pharmacist and PCP to address needs related to medication assistance and diabetes management  Interventions: . Received call from [place] letting me know that patient has been approved for patient assistance for Bydureon Bcise for 2021 calendar year. However, [name] is requiring a new prescription from specifying "Bydureon Bcise" for patient.  . Send coordination of care message to PCP and Asa Lente, CMA in office. . Receive message back from PCP that new prescription  is written as requested and from Janice Coffin that this prescription has been faxed back to [name] [phone]) today. . Follow up with [facility] CPhT  Patient Self Care Activities:  . Self administers medications as prescribed with assistance from his wife o Uses weekly pillbox as adherence tool . Attends all scheduled provider appointments . Calls pharmacy for medication refills . Calls provider office for new concerns or questions . To check blood sugars twice daily and keep log   Please see  past updates related to this goal by clicking on the "Past Updates" button in the selected goal         Plan  The care management team will reach out to the patient again over the next 30 days.   [name], PharmD, [facility] Clinical Pharmacist [facility] Medical [employer] [phone]

## 2019-04-12 NOTE — Patient Outreach (Signed)
[facility]) Care Management  04/12/2019  [name] 1951-01-28 [id number]   Care coordination call placed to [name] in regards to patient's application for Bydureon Bcise.  Spoke to [place] who informed they have not received an updated prescription.  Care coordination in basket message sent to embedded [facility] RPh [name] to inquire about the request that was made on the 13th. She informed the provider was able to make the request today and apologized for the delay.  Will follow up with [name] in 2-3 business days to inquire about status of application.  Sina Lucchesi P. Diante Barley, CPhT Musician Care Management [phone]

## 2019-04-16 ENCOUNTER — Other Ambulatory Visit: Payer: Self-pay | Admitting: Pharmacy Technician

## 2019-04-16 ENCOUNTER — Other Ambulatory Visit: Payer: Self-pay | Admitting: Family Medicine

## 2019-04-16 DIAGNOSIS — J3089 Other allergic rhinitis: Secondary | ICD-10-CM

## 2019-04-16 NOTE — Patient Outreach (Signed)
[facility]) Care Management  04/16/2019  [name] February 14, 1951 [id number]  Care coordination call placed to [name] in regards to patient's application for Bydureon B-Cise.  Spoke to [name] who informed that they have received the script with the correct wording. She informed patient is APPROVED as of 03/29/2019-03/27/2020. She informed the medication would ship in 1-2 business days and is sent to the patient via over night delivery. She informed the patient should either receive it Friday or the following Monday.  Will outreach patient in 3-5 business days to inquire if medication was received.  [name] P. [name], CPhT Musician Care Management [phone]

## 2019-04-17 ENCOUNTER — Other Ambulatory Visit: Payer: Self-pay | Admitting: Pharmacy Technician

## 2019-04-17 NOTE — Patient Outreach (Signed)
[facility]) Care Management  04/17/2019  [name] [birth year] [id number]  Successful outreach call placed to patient in regards to [name] application for [name] and [name] application for [name].  Spoke to patient's spouse Darl Pikes, HIPAA identifiers verified.  Darl Pikes informed she received and mailed back the completed application to me last week. Informed her that I would be looking for it. Informed Darl Pikes that patient's [name] should be delivered either at the end of the week or the 1st of next week and that his renewal application was approved thru 03/27/2020.  Will follow up with patient as previously scheduled to inquire if [name] was received and will submit [name] application once it is received back.  [name] P. [name], CPhT Musician Care Management [phone]

## 2019-04-18 ENCOUNTER — Ambulatory Visit: Payer: Medicare HMO | Admitting: Cardiology

## 2019-04-22 ENCOUNTER — Other Ambulatory Visit: Payer: Self-pay | Admitting: Pharmacy Technician

## 2019-04-22 NOTE — Patient Outreach (Signed)
[facility]) Care Management  04/22/2019  [name] 02/09/1951 [id number]    Successful call placed to patient regarding patient assistance medication delivery of Bydureon Bcise with [name], HIPAA identifiers verified.   Patient informed he received the 90 days supply shipment of the Bydureon. Discussed refill procedure briefly with patient as his wife usually calls in for his refills. The phone number for him to call is [phone]. Patient verbalized understanding. Patient confirmed having name and number  Follow up:  Informed patient that we would follow up with him once we have received submitted and followed up with [name] concerning his [name] applicaiton.  [name] P. [name], CPhT Musician Care Management [phone]

## 2019-04-25 ENCOUNTER — Other Ambulatory Visit: Payer: Self-pay | Admitting: Pharmacy Technician

## 2019-04-25 NOTE — Patient Outreach (Signed)
[facility]) Care Management  04/25/2019  [name] [birth year] [id number]    Received both patient and provider portion(s) of patient assistance application(s) for [name]. Faxed completed application and required documents into [name].  Will follow up with company(ies) in 7-10 business days to check status of application(s).  Gunhild Bautch P. Javier Gell, CPhT Musician Care Management [phone]

## 2019-04-29 ENCOUNTER — Ambulatory Visit (INDEPENDENT_AMBULATORY_CARE_PROVIDER_SITE_OTHER): Payer: Medicare HMO | Admitting: Pharmacist

## 2019-04-29 DIAGNOSIS — Z794 Long term (current) use of insulin: Secondary | ICD-10-CM

## 2019-04-29 DIAGNOSIS — E1121 Type 2 diabetes mellitus with diabetic nephropathy: Secondary | ICD-10-CM

## 2019-04-29 NOTE — Chronic Care Management (AMB) (Signed)
Chronic Care Management   Follow Up Note   04/29/2019 Name: Carlito Bogert MRN: [id number] DOB: May 09, 1950  Referred by: [name], DO Reason for referral : Chronic Care Management (Patient Phone Call)   Dhruva Orndoff is a 69 y.o. year old male who is a primary care patient of [name], DO. The CCM team was consulted for assistance with chronic disease management and care coordination needs. Mr. Escamilla has a past medical history including but not limited to type 2 diabetes mellitus, hypertension, hyperlipidemia, GERD, gout, seasonal allergies and chronic back pain.  I reached out to [name] and his wife by phone today.   Review of patient status, including review of consultants reports, relevant laboratory and other test results, and collaboration with appropriate care team members and the patient's provider was performed as part of comprehensive patient evaluation and provision of chronic care management services.      Outpatient Encounter Medications as of 04/29/2019  Medication Sig  . BYDUREON BCISE 2 MG/0.85ML AUIJ Inject 2 mg as directed once a week.  . Insulin Glargine (LANTUS SOLOSTAR) 100 UNIT/ML Solostar Pen Inject 16 Units into the skin daily at 10 pm. (Patient taking differently: Inject 18 Units into the skin daily at 10 pm. )  . metFORMIN (GLUCOPHAGE-XR) 750 MG 24 hr tablet Take 2 tablets (1,500 mg total) by mouth daily with breakfast. May start with only 1 tab, and increase as tolerated. (Patient taking differently: Take 750 mg by mouth 2 (two) times daily. )  . ACCU-CHEK SMARTVIEW test strip Check sugar 2 times daily  . acetaminophen (TYLENOL) 500 MG tablet Take 1,000 mg by mouth every 8 (eight) hours as needed.  Marland Kitchen allopurinol (ZYLOPRIM) 100 MG tablet Take 1 tablet (100 mg total) by mouth at bedtime.  Marland Kitchen amLODipine (NORVASC) 2.5 MG tablet TAKE 1 TABLET BY MOUTH AT BEDTIME  . calcium carbonate (TUMS - DOSED IN MG ELEMENTAL CALCIUM)  500 MG chewable tablet Chew 2 tablets by mouth at bedtime as needed for indigestion or heartburn.   . colchicine 0.6 MG tablet Take 1 tablet (0.6 mg total) by mouth as needed (gout flare).  Marland Kitchen docusate sodium (COLACE) 100 MG capsule Take 100 mg by mouth 2 (two) times daily.  . fluticasone (FLONASE) 50 MCG/ACT nasal spray USE 2 SPRAYS IN EACH NOSTRIL ONCE DAILY USE FOR 4 TO 6 WEEKS, THEN STOP AND USE SEASONALLY OR AS NEEDED.  Marland Kitchen Insulin Pen Needle (NOVOFINE PLUS) 32G X 4 MM MISC Use with Lantus insulin daily injection as instructed  . lisinopril (PRINIVIL,ZESTRIL) 40 MG tablet Take 1 tablet (40 mg total) by mouth daily.  Marland Kitchen omeprazole (PRILOSEC) 20 MG capsule Take 1 capsule (20 mg total) by mouth daily.   No facility-administered encounter medications on file as of 04/29/2019.    Goals Addressed            This Visit's Progress   . PharmD - Diabetes Management (pt-stated)       Current Barriers:  . Financial Barriers - patient has [employer] and reports copay for Bydureon & Lantus is cost prohibitive at this time  Pharmacist Clinical Goal(s):  Marland Kitchen Over the next 30 days, patient will work with CM Pharmacist and PCP to address needs related to medication assistance and diabetes management  Interventions: . Counsel patient on importance of blood sugar control and monitoring o Reports taking: - Bydureon 2 mg once weekly - Lantus (insulin glargine) 18 units daily - Metformin ER  750 mg BID o Review recent CBGs (see below) - Patient states "I've been eating too much". Reports has been eating more and larger portion sizes of sweets, particularly in the evening; harder for him to avoid due to being home more and less active since his surgery . Reports his physical activity limited since his surgery, but also due to recent weather o Encourage patient to follow physical activty recommendations as provided by surgeon and physical therapist o Mr. Delatte reports continuing to follow exercise  provided by PT o Reports planning to graduatlly start walking more following limits provided by PT . Discuss importance of limiting portion sizes, particularly of sweets and other carbohydrates . Counsel on continued importance of COVID-19 prevention and provide information on COVID-19 vaccines ? Counsel patient/wife on how to access [employer] Vaccine Appointment Wait List online . Will continue to collaborate with [facility] CPhT Pattricia Boss for assistance with aiding patient assistance applications ? Patient successfully re-enrolled with [name] for Bydureon for 2021 and has received first shippment of medication  ? Application for [employer] patient assistance through [employer] to company by [facility] CPhT on 1/28  Patient Self Care Activities:  . Self administers medications as prescribed with assistance from his wife o Uses weekly pillbox as adherence tool . Attends all scheduled provider appointments o Next appointment with [facility]: 3/4 . Calls pharmacy for medication refills . Calls provider office for new concerns or questions . To check blood sugars twice daily and keep log Date Fasting Blood Glucose ~1 hour after Breakfast ~1 hour after Supper Notes  25 - January 125  196   26 - January 196 - - *Cake in evening  27 - January 209 - 174 *Cake in evening  28 - January 203 - -   29 - January - 195 250   30 - January - - 124 *Fudge in evening  31 - January 250*  205   1 - February  154      Please see past updates related to this goal by clicking on the "Past Updates" button in the selected goal        Plan  Telephone follow up appointment with care management team member scheduled for: 3/1 at 2 pm  [name], PharmD, [facility] Clinical Pharmacist [facility] [employer] [phone]

## 2019-04-29 NOTE — Patient Instructions (Signed)
Thank you allowing the Chronic Care Management Team to be a part of your care! It was a pleasure speaking with you today!     CCM (Chronic Care Management) Team    Alto Denver RN, MSN, CCM Nurse Care Coordinator  [phone]   [name] PharmD  Clinical Pharmacist  [phone]   Dickie La LCSW Clinical Social Worker [phone]  Visit Information  Goals Addressed            This Visit's Progress   . PharmD - Diabetes Management (pt-stated)       Current Barriers:  . Financial Barriers - patient has [employer] and reports copay for Bydureon & Lantus is cost prohibitive at this time  Pharmacist Clinical Goal(s):  Marland Kitchen Over the next 30 days, patient will work with CM Pharmacist and PCP to address needs related to medication assistance and diabetes management  Interventions: . Counsel patient on importance of blood sugar control and monitoring o Reports taking: - Bydureon 2 mg once weekly - Lantus (insulin glargine) 18 units daily - Metformin ER 750 mg BID o Review recent CBGs (see below) - Patient states "I've been eating too much". Reports has been eating more and larger portion sizes of sweets, particularly in the evening; harder for him to avoid due to being home more and less active since his surgery . Reports his physical activity limited since his surgery, but also due to recent weather o Encourage patient to follow physical activty recommendations as provided by surgeon and physical therapist o Mr. Schiller reports continuing to follow exercise provided by PT o Reports planning to graduatlly start walking more following limits provided by PT . Discuss importance of limiting portion sizes, particularly of sweets and other carbohydrates . Counsel on continued importance of COVID-19 prevention and provide information on COVID-19 vaccines ? Counsel patient/wife on how to access [employer] Vaccine Appointment Wait List online . Will continue to  collaborate with [facility] CPhT Pattricia Boss for assistance with aiding patient assistance applications ? Patient successfully re-enrolled with [name] for Bydureon for 2021 and has received first shippment of medication  ? Application for [employer] patient assistance through [employer] to company by [facility] CPhT on 1/28  Patient Self Care Activities:  . Self administers medications as prescribed with assistance from his wife o Uses weekly pillbox as adherence tool . Attends all scheduled provider appointments o Next appointment with [facility]: 3/4 . Calls pharmacy for medication refills . Calls provider office for new concerns or questions . To check blood sugars twice daily and keep log Date Fasting Blood Glucose ~1 hour after Breakfast ~1 hour after Supper Notes  25 - January 125  196   26 - January 196 - - *Cake in evening  27 - January 209 - 174 *Cake in evening  28 - January 203 - -   29 - January - 195 250   30 - January - - 124 *Fudge in evening  31 - January 250*  205   1 - February  154      Please see past updates related to this goal by clicking on the "Past Updates" button in the selected goal        The patient verbalized understanding of instructions provided today and declined a print copy of patient instruction materials.   Telephone follow up appointment with care management team member scheduled for: 3/1 at 2 pm  [name], PharmD, [facility] Clinical Pharmacist [facility]  [employer] [phone]

## 2019-05-06 ENCOUNTER — Other Ambulatory Visit: Payer: Self-pay | Admitting: Pharmacy Technician

## 2019-05-06 NOTE — Patient Outreach (Signed)
[facility]) Care Management  05/06/2019  [name] Jun 29, 1950 [id number]   Care coordination call placed to [name] in regards to patient's [name] application.  Spoke to [place] who informed there was no application attached to the patient's file. She informed she searched the processing que and was unable to locate the application despite [place] having a successful confirmation to [name]. Morrie Sheldon suggested to re fax the application to [name] at the normal number as well as to [phone] which she informed was for oncology products but that the applications would get to the correct department.  Will re fax the application when back in the office later this week and will follow up with [name] in 5-7 business days.  [name], CPhT Musician Care Management [phone]

## 2019-05-14 ENCOUNTER — Other Ambulatory Visit: Payer: Self-pay | Admitting: Pharmacy Technician

## 2019-05-14 NOTE — Patient Outreach (Signed)
[facility]) Care Management  05/14/2019  [name] 10-25-1950 [id number]    ADDENDUM   Incoming call from patient regarding patient assistance application(s) for [name] with [employer]ka , HIPAA identifiers verified.   Spoke to patient and his wife [name]. [name] informed she believes the Mariella Saa is scheduled to be delivered tomorrow per a phone call she received late last week. Informed [name] that if it turns out to be the Bydureon ([name]) and not the [name] being delivered then she or [name] will need to call [name] to schedule his [name] shipment. Provided [name] the phone number to [employer] for [employer]. [name] verbalized understanding. Confirmed [name] had name and number.  Follow up:  In basket message sent to embedded [facility] RPh [name] as Lorain Childes.  [name] P. Zeena Starkel, CPhT Musician Care Management [phone]

## 2019-05-14 NOTE — Patient Outreach (Signed)
[facility]) Care Management  05/14/2019  [name] [birth year] [id number]    Care coordination call placed to patient in regards to [facility] application for [name].  Spoke to [facility] who informed patient was APPROVED 05/07/2019. He informed an order was placed with pharmacy but has no shipping information. He informed to have patient call in to schedule his delivery.  Will outreach patient with this information.  Emillio Ngo P. Zniyah Midkiff, CPhT Musician Care Management [phone]

## 2019-05-14 NOTE — Patient Outreach (Signed)
[facility]) Care Management  05/14/2019  [name] [birth year] [id number]   ADDENDUM   Unsuccessful call placed to patient regarding patient assistance application(s) for [name] with [name] , HIPAA compliant voicemail left.   Was calling patient to update him on the approvalof his application and to provide him the phone number to [name] cares to re order the Trulicity if he needs some medication.  Will route note to embedded [facility] RPh [name] to notify patient of his approval and the phone number to Julious Oka which is [phone] if patient needs a refill.  Follow up:  Will route note to embedded [facility] RPh [name] for case closure and will remove myself from care team.   Stacie Acres. Sherl Yzaguirre, CPhT Musician Care Management [phone]

## 2019-05-27 ENCOUNTER — Ambulatory Visit (INDEPENDENT_AMBULATORY_CARE_PROVIDER_SITE_OTHER): Payer: Medicare HMO | Admitting: Pharmacist

## 2019-05-27 DIAGNOSIS — Z794 Long term (current) use of insulin: Secondary | ICD-10-CM

## 2019-05-27 DIAGNOSIS — E1121 Type 2 diabetes mellitus with diabetic nephropathy: Secondary | ICD-10-CM

## 2019-05-27 DIAGNOSIS — I10 Essential (primary) hypertension: Secondary | ICD-10-CM

## 2019-05-27 NOTE — Patient Instructions (Signed)
Thank you allowing the Chronic Care Management Team to be a part of your care! It was a pleasure speaking with you today!     CCM (Chronic Care Management) Team    Alto Denver RN, MSN, CCM Nurse Care Coordinator  [phone]   [name] PharmD  Clinical Pharmacist  [phone]   Dickie La LCSW Clinical Social Worker [phone]  Visit Information  Goals Addressed            This Visit's Progress   . PharmD - Diabetes Management (pt-stated)       Current Barriers:  . Financial Barriers - patient has [employer] and reports copay for Bydureon & Lantus is cost prohibitive at this time  Pharmacist Clinical Goal(s):  Marland Kitchen Over the next 30 days, patient will work with CM Pharmacist and PCP to address needs related to medication assistance and diabetes management  Interventions: . Counsel patient on importance of blood sugar control and monitoring o Reports taking: - Bydureon 2 mg once weekly - Lantus (insulin glargine) 18 units daily - Metformin ER 750 mg BID o Review recent CBGs (see below) - Patient has identified higher readings over weekend and this morning correlating to sweets and larger portion sizes of carbohydrates  . Reports his physical activity limited since his surgery, but recently able to increase his walking. o Patient looking forward to being more active as he heals and weather improves o Encourage patient to follow physical activty recommendations as provided by surgeon and physical therapist . Discuss importance of limiting portion sizes, particularly of sweets and other carbohydrates . Provide counseling on interpreting nutrition labels . Follow up regarding COVID-19 vaccine access ? Reports received 1st dose of Pfizer COVID-19 vaccine and is scheduled to receive 2nd dose on 3/10 . Encourage patient to monitor home BP and keep log . Follow up regarding patient assistance applications ? Note patient successfully re-enrolled with  [name] for Bydureon for 2021 and received medication  ? Patient and wife confirm received [name] from patient assistance through [employer]  ? Confirms understanding that [name] is equivalent to Lantus ? Patient states that he will use up remaining suply of Lantus and then start to take [name] as directed by PCP. ? Will update [facility] CPhT to let her know patient has now received [name] from [employer]  Patient Self Care Activities:  . Self administers medications as prescribed with assistance from his wife o Uses weekly pillbox as adherence tool . Attends all scheduled provider appointments o Next appointment with [facility]: 3/4 . Calls pharmacy for medication refills . Calls provider office for new concerns or questions . To check blood sugars twice daily and keep log Date Fasting Blood Glucose ~2 hour after Breakfast Bedtime Notes  22 - February 147     23 - February   121   24 - February 154  185   25 - February 164  164   26 - February 180  183   27 - February 135  227*   28 - February 148  227* *Had a lot of sweets overnight  1 - March  217  *Pancakes for breakfast  Average 155  184     Please see past updates related to this goal by clicking on the "Past Updates" button in the selected goal        Patient verbalizes understanding of instructions provided today.   Telephone follow up appointment with care management team member scheduled for: 4/5  at 10 am  Harlow Asa, PharmD, [place] [phone]

## 2019-05-27 NOTE — Chronic Care Management (AMB) (Signed)
Chronic Care Management   Follow Up Note   05/27/2019 Name: [name] MRN: [id number] DOB: Apr 20, 1950  Referred by: Daniel Cords, DO Reason for referral : Chronic Care Management (Patient Phone Call)   [name] is a 69 y.o. year old male who is a primary care patient of Daniel Cords, DO. The CCM team was consulted for assistance with chronic disease management and care coordination needs.  [name] has a past medical history including but not limited to type 2 diabetes mellitus, hypertension, hyperlipidemia, GERD, gout, seasonal allergies and chronic back pain.  I reached out to [name] and his wife by phone today.   Review of patient status, including review of consultants reports, relevant laboratory and other test results, and collaboration with appropriate care team members and the patient's provider was performed as part of comprehensive patient evaluation and provision of chronic care management services.     Outpatient Encounter Medications as of 05/27/2019  Medication Sig   BYDUREON BCISE 2 MG/0.85ML AUIJ Inject 2 mg as directed once a week.   Insulin Glargine (LANTUS SOLOSTAR) 100 UNIT/ML Solostar Pen Inject 16 Units into the skin daily at 10 pm. (Patient taking differently: Inject 18 Units into the skin daily at 10 pm. )   metFORMIN (GLUCOPHAGE-XR) 750 MG 24 hr tablet Take 2 tablets (1,500 mg total) by mouth daily with breakfast. May start with only 1 tab, and increase as tolerated. (Patient taking differently: Take 750 mg by mouth 2 (two) times daily. )   ACCU-CHEK SMARTVIEW test strip Check sugar 2 times daily   acetaminophen (TYLENOL) 500 MG tablet Take 1,000 mg by mouth every 8 (eight) hours as needed.   allopurinol (ZYLOPRIM) 100 MG tablet Take 1 tablet (100 mg total) by mouth at bedtime.   amLODipine (NORVASC) 2.5 MG tablet TAKE 1 TABLET BY MOUTH AT BEDTIME   calcium carbonate (TUMS - DOSED IN MG ELEMENTAL CALCIUM)  500 MG chewable tablet Chew 2 tablets by mouth at bedtime as needed for indigestion or heartburn.    colchicine 0.6 MG tablet Take 1 tablet (0.6 mg total) by mouth as needed (gout flare).   docusate sodium (COLACE) 100 MG capsule Take 100 mg by mouth 2 (two) times daily.   fluticasone (FLONASE) 50 MCG/ACT nasal spray USE 2 SPRAYS IN EACH NOSTRIL ONCE DAILY USE FOR 4 TO 6 WEEKS, THEN STOP AND USE SEASONALLY OR AS NEEDED.   Insulin Pen Needle (NOVOFINE PLUS) 32G X 4 MM MISC Use with Lantus insulin daily injection as instructed   lisinopril (PRINIVIL,ZESTRIL) 40 MG tablet Take 1 tablet (40 mg total) by mouth daily.   omeprazole (PRILOSEC) 20 MG capsule Take 1 capsule (20 mg total) by mouth daily.   No facility-administered encounter medications on file as of 05/27/2019.    Goals Addressed            This Visit's Progress    PharmD - Diabetes Management (pt-stated)       Current Barriers:   Financial Barriers - patient has [employer] and reports copay for Bydureon & Lantus is cost prohibitive at this time  Pharmacist Clinical Goal(s):   Over the next 30 days, patient will work with [employer] Pharmacist and PCP to address needs related to medication assistance and diabetes management  Interventions:  Counsel patient on importance of blood sugar control and monitoring o Reports taking: - Bydureon 2 mg once weekly - Lantus (insulin glargine) 18 units daily - Metformin ER  750 mg BID o Review recent CBGs (see below) - Patient has identified higher readings over weekend and this morning correlating to sweets and larger portion sizes of carbohydrates   Reports his physical activity limited since his surgery, but recently able to increase his walking. o Patient looking forward to being more active as he heals and weather improves o Encourage patient to follow physical activty recommendations as provided by surgeon and physical therapist  Discuss importance of limiting portion  sizes, particularly of sweets and other carbohydrates  Provide counseling on interpreting nutrition labels  Follow up regarding COVID-19 vaccine access ? Reports received 1st dose of Pfizer COVID-19 vaccine and is scheduled to receive 2nd dose on 3/10  Encourage patient to monitor home BP and keep log  Follow up regarding patient assistance applications ? Note patient successfully re-enrolled with [name] for Bydureon for 2021 and received medication  ? Patient and wife confirm received [name] from patient assistance through [employer]  ? Confirms understanding that [name] is equivalent to Lantus ? Patient states that he will use up remaining suply of Lantus and then start to take [place] as directed by PCP. ? Will update [facility] CPhT to let her know patient has now received [name] from [employer]  Patient Self Care Activities:   Self administers medications as prescribed with assistance from his wife o Uses weekly pillbox as adherence tool  Attends all scheduled provider appointments o Next appointment with [facility]: 3/4  Calls pharmacy for medication refills  Calls provider office for new concerns or questions  To check blood sugars twice daily and keep log Date Fasting Blood Glucose ~2 hour after Breakfast Bedtime Notes  22 - February 147     23 - February   121   24 - February 154  185   25 - February 164  164   26 - February 180  183   27 - February 135  227*   28 - February 148  227* *Had a lot of sweets overnight  1 - March  217  *Pancakes for breakfast  Average 155  184     Please see past updates related to this goal by clicking on the "Past Updates" button in the selected goal        Plan  Telephone follow up appointment with care management team member scheduled for: 4/5 at 10 am  Harlow Asa, PharmD, [place] [phone]

## 2019-05-30 DIAGNOSIS — Z4789 Encounter for other orthopedic aftercare: Secondary | ICD-10-CM | POA: Diagnosis not present

## 2019-05-30 DIAGNOSIS — Z981 Arthrodesis status: Secondary | ICD-10-CM | POA: Diagnosis not present

## 2019-06-03 ENCOUNTER — Other Ambulatory Visit: Payer: Self-pay | Admitting: Family Medicine

## 2019-06-03 DIAGNOSIS — M1000 Idiopathic gout, unspecified site: Secondary | ICD-10-CM

## 2019-06-03 DIAGNOSIS — I10 Essential (primary) hypertension: Secondary | ICD-10-CM

## 2019-06-11 ENCOUNTER — Other Ambulatory Visit: Payer: Self-pay | Admitting: Family Medicine

## 2019-06-11 DIAGNOSIS — E1121 Type 2 diabetes mellitus with diabetic nephropathy: Secondary | ICD-10-CM

## 2019-06-11 DIAGNOSIS — E1165 Type 2 diabetes mellitus with hyperglycemia: Secondary | ICD-10-CM

## 2019-06-11 DIAGNOSIS — IMO0002 Reserved for concepts with insufficient information to code with codable children: Secondary | ICD-10-CM

## 2019-06-12 ENCOUNTER — Other Ambulatory Visit: Payer: Self-pay | Admitting: Family Medicine

## 2019-06-12 DIAGNOSIS — K219 Gastro-esophageal reflux disease without esophagitis: Secondary | ICD-10-CM

## 2019-06-12 DIAGNOSIS — I10 Essential (primary) hypertension: Secondary | ICD-10-CM

## 2019-06-27 ENCOUNTER — Other Ambulatory Visit: Payer: Self-pay

## 2019-06-27 ENCOUNTER — Ambulatory Visit (INDEPENDENT_AMBULATORY_CARE_PROVIDER_SITE_OTHER): Payer: Medicare HMO | Admitting: Family Medicine

## 2019-06-27 DIAGNOSIS — N3000 Acute cystitis without hematuria: Secondary | ICD-10-CM

## 2019-06-27 LAB — POCT URINALYSIS DIPSTICK
Bilirubin, UA: NEGATIVE
Blood, UA: NEGATIVE
Glucose, UA: NEGATIVE
Ketones, UA: NEGATIVE
Leukocytes, UA: NEGATIVE
Nitrite, UA: NEGATIVE
Protein, UA: NEGATIVE
Spec Grav, UA: 1.01 (ref 1.010–1.025)
Urobilinogen, UA: 0.2 E.U./dL
pH, UA: 5 (ref 5.0–8.0)

## 2019-06-27 MED ORDER — CIPROFLOXACIN HCL 500 MG PO TABS: 500.0000 mg | ORAL_TABLET | Freq: Two times a day (BID) | ORAL | 0 refills | Status: DC

## 2019-06-27 NOTE — Progress Notes (Signed)
Virtual Visit via Telephone The purpose of this virtual visit is to provide medical care while limiting exposure to the novel coronavirus (COVID19) for both patient and office staff.  Consent was obtained for phone visit:  Yes.   Answered questions that patient had about telehealth interaction:  Yes.   I discussed the limitations, risks, security and privacy concerns of performing an evaluation and management service by telephone. I also discussed with the patient that there may be a patient responsible charge related to this service. The patient expressed understanding and agreed to proceed.  Patient Location: Home Provider Location: [facility])  ---------------------------------------------------------------------- Chief Complaint  Patient presents with  . Urinary Tract Infection    burning onset yesterday Azo was taken     S: Reviewed CMA documentation. I have called patient and gathered additional HPI as follows:  UTI Prior recent history with some increase in UTI frequency, last course was in 12/2018 had E Coli UTI on culture treated w Cefdinir then unresolved, treated with Cipro and it was effective. He had prior history of improvement on sulfa drug but that was years ago. - Now today he reports recent problem in past few days with dysuria recurrent concern for UTI - He took AZO temporarily PRN some relief - He has come by for urine sample today He has not seen Urologist lately. He reports saw one >10-20 years ago had a cystoscopy was dx with lingering UTI or unresolved UTI PMH Type 2 DM. No longer on SGLT2, he is on GLP1  Denies any known or suspected exposure to person with or possibly with COVID19.  Denies any fevers, chills, sweats, body ache, cough, shortness of breath, sinus pain or pressure, headache, abdominal pain, diarrhea  Past Medical History:  Diagnosis Date  . Allergy   . Chronic headaches   . GERD (gastroesophageal reflux disease)   .  Heart burn   . Hyperlipidemia   . Hypertension    Social History   Tobacco Use  . Smoking status: Former Smoker    Types: Cigars    Quit date: 2000    Years since quitting: 21.2  . Smokeless tobacco: Current User    Types: Chew  . Tobacco comment: wife states patient never smoked cigarettes, just chewing tobacco  Substance Use Topics  . Alcohol use: Not Currently    Alcohol/week: 0.0 standard drinks  . Drug use: No    Current Outpatient Medications:  .  ACCU-CHEK SMARTVIEW test strip, Check sugar 2 times daily, Disp: 100 each, Rfl: 5 .  acetaminophen (TYLENOL) 500 MG tablet, Take 1,000 mg by mouth every 8 (eight) hours as needed., Disp: , Rfl:  .  allopurinol (ZYLOPRIM) 100 MG tablet, TAKE 1 TABLET BY MOUTH AT BEDTIME, Disp: 90 tablet, Rfl: 1 .  amLODipine (NORVASC) 2.5 MG tablet, TAKE 1 TABLET BY MOUTH AT BEDTIME, Disp: 90 tablet, Rfl: 1 .  BYDUREON BCISE 2 MG/0.85ML AUIJ, INJECT 2 MG SUBCUTANEOUSLY ONCE A WEEK, Disp: 12 mL, Rfl: 3 .  calcium carbonate (TUMS - DOSED IN MG ELEMENTAL CALCIUM) 500 MG chewable tablet, Chew 2 tablets by mouth at bedtime as needed for indigestion or heartburn. , Disp: , Rfl:  .  docusate sodium (COLACE) 100 MG capsule, Take 100 mg by mouth 2 (two) times daily., Disp: , Rfl:  .  Insulin Glargine (LANTUS SOLOSTAR) 100 UNIT/ML Solostar Pen, Inject 16 Units into the skin daily at 10 pm. (Patient taking differently: Inject 18 Units into the skin daily  at 10 pm. ), Disp: 45 mL, Rfl: 3 .  Insulin Pen Needle (NOVOFINE PLUS) 32G X 4 MM MISC, Use with Lantus insulin daily injection as instructed, Disp: 90 each, Rfl: 3 .  lisinopril (ZESTRIL) 40 MG tablet, Take 1 tablet by mouth once daily, Disp: 90 tablet, Rfl: 1 .  metFORMIN (GLUCOPHAGE-XR) 750 MG 24 hr tablet, Take 2 tablets (1,500 mg total) by mouth daily with breakfast. (Patient taking differently: Take 1,500 mg by mouth daily with breakfast. Patient take one in the morning and one in the evening), Disp: 180  tablet, Rfl: 0 .  omeprazole (PRILOSEC) 20 MG capsule, Take 1 capsule by mouth once daily, Disp: 90 capsule, Rfl: 1 .  ciprofloxacin (CIPRO) 500 MG tablet, Take 1 tablet (500 mg total) by mouth 2 (two) times daily., Disp: 14 tablet, Rfl: 0 .  colchicine 0.6 MG tablet, Take 1 tablet (0.6 mg total) by mouth as needed (gout flare). (Patient not taking: Reported on 06/27/2019), Disp: 90 tablet, Rfl: 3 .  fluticasone (FLONASE) 50 MCG/ACT nasal spray, USE 2 SPRAYS IN EACH NOSTRIL ONCE DAILY USE FOR 4 TO 6 WEEKS, THEN STOP AND USE SEASONALLY OR AS NEEDED. (Patient not taking: Reported on 06/27/2019), Disp: 48 g, Rfl: 1  Depression screen [facility] 2/9 08/28/2018 04/23/2018 01/15/2018  Decreased Interest 0 0 0  Down, Depressed, Hopeless 0 0 0  PHQ - 2 Score 0 0 0    No flowsheet data found.  -------------------------------------------------------------------------- O: No physical exam performed due to remote telephone encounter.  Lab results reviewed.  Urine Culture Order: [id number] Status:  Final result Visible to patient:  Yes (MyChart) Next appt:  07/01/2019 at 10:00 AM in Internal Medicine ([place]) Dx:  Acute cystitis with hematuria Specimen Information: Urine       (important suggestion)  Newer results are available. Click to view them now.  Component 5 mo ago  MICRO NUMBER: [id number]   SPECIMEN QUALITY: Adequate   Sample Source URINE   STATUS: FINAL   ISOLATE 1: Escherichia coliAbnormal    Comment: Greater than 100,000 CFU/mL of Escherichia coli  Resulting Agency [facility]  Susceptibility   Escherichia coli    URINE CULTURE, REFLEX    AMOX/CLAVULANIC 16  Intermediate    AMPICILLIN >=32  Resistant    AMPICILLIN/SULBACTAM >=32  Resistant    CEFAZOLIN <=4  Not Reportable1    CEFEPIME <=1  Sensitive    CEFTRIAXONE <=1  Sensitive    CIPROFLOXACIN <=0.25  Sensitive    ERTAPENEM <=0.5  Sensitive    GENTAMICIN <=1  Sensitive    IMIPENEM <=0.25  Sensitive    LEVOFLOXACIN <=0.12   Sensitive    NITROFURANTOIN <=16  Sensitive    PIP/TAZO <=4  Sensitive    TOBRAMYCIN <=1  Sensitive    TRIMETH/SULFA >=320  Resistant2         1 For infections other than uncomplicated UTI  caused by E. coli, K. pneumoniae or P. mirabilis:  Cefazolin is resistant if MIC > or = 8 mcg/mL.  (Distinguishing susceptible versus intermediate  for isolates with MIC < or = 4 mcg/mL requires  additional testing.)  For uncomplicated UTI caused by E. coli,  K. pneumoniae or P. mirabilis: Cefazolin is  susceptible if MIC <32 mcg/mL and predicts  susceptible to the oral agents cefaclor, cefdinir,  cefpodoxime, cefprozil, cefuroxime, cephalexin  and loracarbef.  2 Legend:  S = Susceptible I = Intermediate  R = Resistant NS = Not susceptible  * =  Not tested NR = Not reported  **NN = See antimicrobic comments        Specimen Collected: 12/31/18 11:59          Recent Results (from the past 2160 hour(s))  POCT Urinalysis Dipstick     Status: Normal   Collection Time: 06/27/19 11:50 AM  Result Value Ref Range   Color, UA dark amber    Clarity, UA cloudy    Glucose, UA Negative Negative   Bilirubin, UA Negative    Ketones, UA Negative    Spec Grav, UA 1.010 1.010 - 1.025   Blood, UA Negative    pH, UA 5.0 5.0 - 8.0   Protein, UA Negative Negative   Urobilinogen, UA 0.2 0.2 or 1.0 E.U./dL   Nitrite, UA Negative    Leukocytes, UA Negative Negative   Appearance     Odor      -------------------------------------------------------------------------- A&P:  Problem List Items Addressed This Visit    None    Visit Diagnoses    Acute cystitis without hematuria    -  Primary   Relevant Medications   ciprofloxacin (CIPRO) 500 MG tablet   Other Relevant Orders   POCT Urinalysis Dipstick   Urine Culture     Clinically consistent with UTI by history Known DM2 and has had issue of some recurrence UTI No concern for pyelo today (no systemic symptoms, neg fever, back pain,  n/v).  Plan: 1. UA today - negative on dipstick 2. Ordered Urine culture - cancelled initial [facility] order. Will send to [facility] 3. Cipro 500 BID x 7 days (will treat based off of last urine culture from 12/2018 - bactrim / PCN resistance) 4. Improve PO hydration 5. RTC if no improvement 1-2 weeks, red flags given to return sooner  Consider Urology in future   Meds ordered this encounter  Medications  . ciprofloxacin (CIPRO) 500 MG tablet    Sig: Take 1 tablet (500 mg total) by mouth 2 (two) times daily.    Dispense:  14 tablet    Refill:  0    Follow-up: - Return in 1-2 weeks if not improved  Patient verbalizes understanding with the above medical recommendations including the limitation of remote medical advice.  Specific follow-up and call-back criteria were given for patient to follow-up or seek medical care more urgently if needed.   - Time spent in direct consultation with patient on phone: 12 minutes  Saralyn Pilar, DO [facility] 06/27/2019, 11:33 AM

## 2019-06-27 NOTE — Patient Instructions (Addendum)
Start Cipro antibiotic twice a day for 7 days We will send urine [facility] for culture May limit AZO use if start to feel better, avoid taking for >3 days if possible. Keep well hydrated  If recurrent issues or keeps coming back we can call Urology   Please schedule a Follow-up Appointment to: Return in about 1 week (around 07/04/2019), or if symptoms worsen or fail to improve, for UTI.  If you have any other questions or concerns, please feel free to call the office or send a message through MyChart. You may also schedule an earlier appointment if necessary.  Additionally, you may be receiving a survey about your experience at our office within a few days to 1 week by e-mail or mail. We value your feedback.  Saralyn Pilar, DO [facility], [place]

## 2019-07-01 ENCOUNTER — Ambulatory Visit (INDEPENDENT_AMBULATORY_CARE_PROVIDER_SITE_OTHER): Payer: Medicare HMO | Admitting: Pharmacist

## 2019-07-01 DIAGNOSIS — N3000 Acute cystitis without hematuria: Secondary | ICD-10-CM

## 2019-07-01 DIAGNOSIS — Z794 Long term (current) use of insulin: Secondary | ICD-10-CM | POA: Diagnosis not present

## 2019-07-01 DIAGNOSIS — E1121 Type 2 diabetes mellitus with diabetic nephropathy: Secondary | ICD-10-CM

## 2019-07-01 NOTE — Patient Instructions (Signed)
Thank you allowing the Chronic Care Management Team to be a part of your care! It was a pleasure speaking with you today!     CCM (Chronic Care Management) Team    Alto Denver RN, MSN, CCM Nurse Care Coordinator  [phone]   [name] PharmD  Clinical Pharmacist  [phone]   Dickie La LCSW Clinical Social Worker [phone]  Visit Information  Goals Addressed            This Visit's Progress   . PharmD - Diabetes Management (pt-stated)       Current Barriers:  . Financial Barriers - patient has [employer] and reports copay for Bydureon & Lantus is cost prohibitive at this time  Pharmacist Clinical Goal(s):  Marland Kitchen Over the next 30 days, patient will work with CM Pharmacist and PCP to address needs related to medication assistance and diabetes management  Interventions: . Perform chart review o Patient seen by PCP on 4/1 for acute cystitis  o Started on Cipro 500 mg BID x 7 days . Follow up with patient regarding recent UTI o Reports UTI symptoms resolved with first days of antibiotic o Encourage patient to complete course o Patient reports his stomach has been upset/nausea since last night - Encourage patient to take all doses of Cipro with food, but not dairy o Counsel patient to separate administration of Cipro from antacids, such as Tums - Note Cipro should be administered at least 2 hours before or 6 hours after administration of an antacid. o Counsel on precautions for sun-sensitivity while taking Cipro . Counsel patient on importance of staying well-hydrated . Encourage patient to follow up with PCP if GI symptoms continue/worsen o Patient states that he will call tomorrow if not improved . Counsel patient on importance of blood sugar control and monitoring o Reports taking: - Bydureon 2 mg once weekly - Lantus (insulin glargine) 18 units daily - Metformin ER 750 mg BID o Review recent CBGs (see below) - Patient reports recently  having more sweets around Easter and snacking more last week when he was at the beach  . Encourage patient to follow instructions from PCP for adjusting Lantus dose for fasting sugars >150 on average . Discuss importance of limiting portion sizes, particularly of sweets and other carbohydrates . Encourage patient to monitor home BP and keep log  Patient Self Care Activities:  . Self administers medications as prescribed with assistance from his wife o Uses weekly pillbox as adherence tool . Attends all scheduled provider appointments . Calls pharmacy for medication refills . Calls provider office for new concerns or questions . To check blood sugars twice daily and keep log Date Fasting Blood Glucose Bedtime Notes  31 - March 201 236   1 - April 200 208   2 - April - 266* *Easter candy  3 - April 185 161   4 - April 161 158   5 - April 215    Average 192 206      Please see past updates related to this goal by clicking on the "Past Updates" button in the selected goal        Patient verbalizes understanding of instructions provided today.   Telephone follow up appointment with care management team member scheduled for: 5/3 at 10 am  [name], PharmD, [facility] Clinical Pharmacist [facility] [employer] [phone]

## 2019-07-01 NOTE — Chronic Care Management (AMB) (Signed)
Chronic Care Management   Follow Up Note   07/01/2019 Name: [name] MRN: [id number] DOB: Aug 10, 1950  Referred by: Daniel Cords, DO Reason for referral : Chronic Care Management (Patient Phone Call)   [name] is a 69 y.o. year old male who is a primary care patient of Daniel Cords, DO. The CCM team was consulted for assistance with chronic disease management and care coordination needs.  Mr. Hang has a past medical history including but not limited to type 2 diabetes mellitus, hypertension, hyperlipidemia, GERD, gout, seasonal allergies and chronic back pain.  I reached out to [name] by phone today.   Review of patient status, including review of consultants reports, relevant laboratory and other test results, and collaboration with appropriate care team members and the patient's provider was performed as part of comprehensive patient evaluation and provision of chronic care management services.     Outpatient Encounter Medications as of 07/01/2019  Medication Sig  . BYDUREON BCISE 2 MG/0.85ML AUIJ INJECT 2 MG SUBCUTANEOUSLY ONCE A WEEK  . ciprofloxacin (CIPRO) 500 MG tablet Take 1 tablet (500 mg total) by mouth 2 (two) times daily.  . Insulin Glargine (LANTUS SOLOSTAR) 100 UNIT/ML Solostar Pen Inject 16 Units into the skin daily at 10 pm. (Patient taking differently: Inject 18 Units into the skin daily at 10 pm. )  . metFORMIN (GLUCOPHAGE-XR) 750 MG 24 hr tablet Take 2 tablets (1,500 mg total) by mouth daily with breakfast. (Patient taking differently: Take 1,500 mg by mouth daily with breakfast. Patient take one in the morning and one in the evening)  . omeprazole (PRILOSEC) 20 MG capsule Take 1 capsule by mouth once daily  . ACCU-CHEK SMARTVIEW test strip Check sugar 2 times daily  . acetaminophen (TYLENOL) 500 MG tablet Take 1,000 mg by mouth every 8 (eight) hours as needed.  Marland Kitchen allopurinol (ZYLOPRIM) 100 MG tablet TAKE 1 TABLET BY  MOUTH AT BEDTIME  . amLODipine (NORVASC) 2.5 MG tablet TAKE 1 TABLET BY MOUTH AT BEDTIME  . calcium carbonate (TUMS - DOSED IN MG ELEMENTAL CALCIUM) 500 MG chewable tablet Chew 2 tablets by mouth at bedtime as needed for indigestion or heartburn.   . colchicine 0.6 MG tablet Take 1 tablet (0.6 mg total) by mouth as needed (gout flare). (Patient not taking: Reported on 06/27/2019)  . docusate sodium (COLACE) 100 MG capsule Take 100 mg by mouth 2 (two) times daily.  . fluticasone (FLONASE) 50 MCG/ACT nasal spray USE 2 SPRAYS IN EACH NOSTRIL ONCE DAILY USE FOR 4 TO 6 WEEKS, THEN STOP AND USE SEASONALLY OR AS NEEDED. (Patient not taking: Reported on 06/27/2019)  . Insulin Pen Needle (NOVOFINE PLUS) 32G X 4 MM MISC Use with Lantus insulin daily injection as instructed  . lisinopril (ZESTRIL) 40 MG tablet Take 1 tablet by mouth once daily   No facility-administered encounter medications on file as of 07/01/2019.    Goals Addressed            This Visit's Progress   . PharmD - Diabetes Management (pt-stated)       Current Barriers:  . Financial Barriers - patient has [employer] and reports copay for Bydureon & Lantus is cost prohibitive at this time  Pharmacist Clinical Goal(s):  Marland Kitchen Over the next 30 days, patient will work with CM Pharmacist and PCP to address needs related to medication assistance and diabetes management  Interventions: . Perform chart review o Patient seen by PCP on 4/1  for acute cystitis  o Started on Cipro 500 mg BID x 7 days . Follow up with patient regarding recent UTI o Reports UTI symptoms resolved with first days of antibiotic o Encourage patient to complete course o Patient reports his stomach has been upset/nausea since last night - Encourage patient to take all doses of Cipro with food, but not dairy o Counsel patient to separate administration of Cipro from antacids, such as Tums - Note Cipro should be administered at least 2 hours before or 6 hours  after administration of an antacid. o Counsel on precautions for sun-sensitivity while taking Cipro . Counsel patient on importance of staying well-hydrated . Encourage patient to follow up with PCP if GI symptoms continue/worsen o Patient states that he will call tomorrow if not improved . Counsel patient on importance of blood sugar control and monitoring o Reports taking: - Bydureon 2 mg once weekly - Lantus (insulin glargine) 18 units daily - Metformin ER 750 mg BID o Review recent CBGs (see below) - Patient reports recently having more sweets around Easter and snacking more last week when he was at the beach  . Encourage patient to follow instructions from PCP for adjusting Lantus dose for fasting sugars >150 on average . Discuss importance of limiting portion sizes, particularly of sweets and other carbohydrates . Encourage patient to monitor home BP and keep log  Patient Self Care Activities:  . Self administers medications as prescribed with assistance from his wife o Uses weekly pillbox as adherence tool . Attends all scheduled provider appointments . Calls pharmacy for medication refills . Calls provider office for new concerns or questions . To check blood sugars twice daily and keep log Date Fasting Blood Glucose Bedtime Notes  31 - March 201 236   1 - April 200 208   2 - April - 266* *Easter candy  3 - April 185 161   4 - April 161 158   5 - April 215    Average 192 206      Please see past updates related to this goal by clicking on the "Past Updates" button in the selected goal        Plan  Telephone follow up appointment with care management team member scheduled for: 5/3 at 10 am  Harlow Asa, PharmD, [place] [phone]

## 2019-07-03 ENCOUNTER — Other Ambulatory Visit: Payer: Self-pay | Admitting: Family Medicine

## 2019-07-03 DIAGNOSIS — E1121 Type 2 diabetes mellitus with diabetic nephropathy: Secondary | ICD-10-CM

## 2019-07-03 DIAGNOSIS — E1165 Type 2 diabetes mellitus with hyperglycemia: Secondary | ICD-10-CM

## 2019-07-03 DIAGNOSIS — IMO0002 Reserved for concepts with insufficient information to code with codable children: Secondary | ICD-10-CM

## 2019-07-03 MED ORDER — ACCU-CHEK SMARTVIEW VI STRP: ORAL_STRIP | 5 refills | Status: DC

## 2019-07-03 NOTE — Telephone Encounter (Signed)
Pt called requesting refill on accu-check smart view  Test strip

## 2019-07-19 ENCOUNTER — Other Ambulatory Visit: Payer: Self-pay | Admitting: Family Medicine

## 2019-07-19 DIAGNOSIS — I10 Essential (primary) hypertension: Secondary | ICD-10-CM

## 2019-07-19 DIAGNOSIS — E1121 Type 2 diabetes mellitus with diabetic nephropathy: Secondary | ICD-10-CM

## 2019-07-19 DIAGNOSIS — J3089 Other allergic rhinitis: Secondary | ICD-10-CM

## 2019-07-19 DIAGNOSIS — E1165 Type 2 diabetes mellitus with hyperglycemia: Secondary | ICD-10-CM

## 2019-07-19 DIAGNOSIS — K219 Gastro-esophageal reflux disease without esophagitis: Secondary | ICD-10-CM

## 2019-07-19 DIAGNOSIS — IMO0002 Reserved for concepts with insufficient information to code with codable children: Secondary | ICD-10-CM

## 2019-07-19 MED ORDER — FLUTICASONE PROPIONATE 50 MCG/ACT NA SUSP: NASAL | 0 refills | Status: DC

## 2019-07-19 MED ORDER — LISINOPRIL 40 MG PO TABS: 40.0000 mg | ORAL_TABLET | Freq: Every day | ORAL | 0 refills | Status: DC

## 2019-07-19 MED ORDER — METFORMIN HCL ER 750 MG PO TB24: 1500.0000 mg | ORAL_TABLET | Freq: Every day | ORAL | 0 refills | Status: DC

## 2019-07-19 MED ORDER — OMEPRAZOLE 20 MG PO CPDR: 20.0000 mg | DELAYED_RELEASE_CAPSULE | Freq: Every day | ORAL | 0 refills | Status: DC

## 2019-07-19 MED ORDER — AMLODIPINE BESYLATE 2.5 MG PO TABS: 2.5000 mg | ORAL_TABLET | Freq: Every day | ORAL | 0 refills | Status: DC

## 2019-07-19 NOTE — Telephone Encounter (Signed)
Change in Pharmacy- remainder of RF forwarded to [facility]

## 2019-07-19 NOTE — Telephone Encounter (Signed)
Medication Refill - Medication: lisinopril (ZESTRIL) 40 MG tablet  amLODipine (NORVASC) 2.5 MG tablet omeprazole (PRILOSEC) 20 MG capsule fluticasone (FLONASE) 50 MCG/ACT nasal spray metFORMIN (GLUCOPHAGE-XR) 750 MG 24 hr tablet   Has the patient contacted their pharmacy? Yes.   (Agent: If no, request that the patient contact the pharmacy for the refill.) (Agent: If yes, when and what did the pharmacy advise?)  Preferred Pharmacy (with phone number or street name):  [facility] - [place], [place] - [address]  [address] [place] [address]  Phone: [phone] Fax: [phone]     Agent: Please be advised that RX refills may take up to 3 business days. We ask that you follow-up with your pharmacy.

## 2019-07-29 ENCOUNTER — Ambulatory Visit (INDEPENDENT_AMBULATORY_CARE_PROVIDER_SITE_OTHER): Payer: Medicare HMO | Admitting: Pharmacist

## 2019-07-29 DIAGNOSIS — Z794 Long term (current) use of insulin: Secondary | ICD-10-CM

## 2019-07-29 DIAGNOSIS — I10 Essential (primary) hypertension: Secondary | ICD-10-CM

## 2019-07-29 DIAGNOSIS — E1121 Type 2 diabetes mellitus with diabetic nephropathy: Secondary | ICD-10-CM

## 2019-07-29 NOTE — Patient Instructions (Addendum)
Thank you allowing the Chronic Care Management Team to be a part of your care! It was a pleasure speaking with you today!     CCM (Chronic Care Management) Team    Alto Denver RN, MSN, CCM Nurse Care Coordinator  [phone]   [name] PharmD  Clinical Pharmacist  [phone]   Dickie La LCSW Clinical Social Worker [phone]  Visit Information  Goals Addressed            This Visit's Progress   . PharmD - Diabetes Management (pt-stated)       Current Barriers:  . Financial Barriers - patient has [employer] and reports copay for Bydureon & Lantus is cost prohibitive at this time  Pharmacist Clinical Goal(s):  Marland Kitchen Over the next 30 days, patient will work with CM Pharmacist and PCP to address needs related to medication assistance and diabetes management  Interventions: . Counsel on importance of staying hydrated. Reports has been trying to drink more water. . Reports strength continues to slowly improve post-back surgery. Reports improvement in walking and ability ability to go up and down stairs. o Reports still gets sore with exertion . Counsel patient on importance of blood sugar control and monitoring o Reports taking: - Bydureon 2 mg once weekly - Metformin ER 750 mg BID - [name] (insulin glargine) 19 units daily  Reports used up previous supply of Lantus and now using [name] brand of insulin glargine, as received through [employer] patient assistance program. o Reports has not been checking CBGs consistently. Over past week only checked: - Yesterday at bedtime: 171 - Today in morning: 151 o Denies any s/s low blood sugar o Reports rarely misses a dose of medications . Encourage patient to restart checking CBGs as directed and keeping log of results . Encourage patient to monitor home BP and keep log ? Reports recently obtained new upper arm BP monitor  Patient Self Care Activities:  . Self administers medications as  prescribed with assistance from his wife o Uses weekly pillbox as adherence tool . Attends all scheduled provider appointments . Calls pharmacy for medication refills . Calls provider office for new concerns or questions . To check blood sugars twice daily and keep log   Please see past updates related to this goal by clicking on the "Past Updates" button in the selected goal        Patient verbalizes understanding of instructions provided today.   Telephone follow up appointment with care management team member scheduled for: 5/10 at 9 am  [name], PharmD, [facility] Clinical Pharmacist [facility] [employer] [phone]

## 2019-07-29 NOTE — Chronic Care Management (AMB) (Signed)
Chronic Care Management   Follow Up Note   07/29/2019 Name: Malic Rosten MRN: [id number] DOB: 15-Mar-1951  Referred by: [name], DO Reason for referral : Chronic Care Management (Patient Phone Call)   Vonte Rossin is a 69 y.o. year old male who is a primary care patient of [name], DO. The CCM team was consulted for assistance with chronic disease management and care coordination needs. Mr. Polinski has a past medical history including but not limited to type 2 diabetes mellitus, hypertension, hyperlipidemia, GERD, gout, seasonal allergies and chronic back pain.    I reached out to [name] by phone today.   Review of patient status, including review of consultants reports, relevant laboratory and other test results, and collaboration with appropriate care team members and the patient's provider was performed as part of comprehensive patient evaluation and provision of chronic care management services.     Outpatient Encounter Medications as of 07/29/2019  Medication Sig  . BYDUREON BCISE 2 MG/0.85ML AUIJ INJECT 2 MG SUBCUTANEOUSLY ONCE A WEEK  . metFORMIN (GLUCOPHAGE-XR) 750 MG 24 hr tablet Take 2 tablets (1,500 mg total) by mouth daily with breakfast.  . ACCU-CHEK SMARTVIEW test strip USE 1 STRIP TO CHECK GLUCOSE TWICE DAILY  . ACCU-CHEK SMARTVIEW test strip Check sugar 2 times daily  . acetaminophen (TYLENOL) 500 MG tablet Take 1,000 mg by mouth every 8 (eight) hours as needed.  Marland Kitchen allopurinol (ZYLOPRIM) 100 MG tablet TAKE 1 TABLET BY MOUTH AT BEDTIME  . amLODipine (NORVASC) 2.5 MG tablet Take 1 tablet (2.5 mg total) by mouth at bedtime.  . calcium carbonate (TUMS - DOSED IN MG ELEMENTAL CALCIUM) 500 MG chewable tablet Chew 2 tablets by mouth at bedtime as needed for indigestion or heartburn.   . ciprofloxacin (CIPRO) 500 MG tablet Take 1 tablet (500 mg total) by mouth 2 (two) times daily.  . colchicine 0.6 MG tablet Take 1 tablet (0.6 mg  total) by mouth as needed (gout flare). (Patient not taking: Reported on 06/27/2019)  . docusate sodium (COLACE) 100 MG capsule Take 100 mg by mouth 2 (two) times daily.  . fluticasone (FLONASE) 50 MCG/ACT nasal spray USE 2 SPRAYS IN EACH NOSTRIL ONCE DAILY USE FOR 4 TO 6 WEEKS, THEN STOP AND USE SEASONALLY OR AS NEEDED.  Marland Kitchen Insulin Glargine (LANTUS SOLOSTAR) 100 UNIT/ML Solostar Pen Inject 16 Units into the skin daily at 10 pm. (Patient taking differently: Inject 18 Units into the skin daily at 10 pm. )  . Insulin Pen Needle (NOVOFINE PLUS) 32G X 4 MM MISC Use with Lantus insulin daily injection as instructed  . lisinopril (ZESTRIL) 40 MG tablet Take 1 tablet (40 mg total) by mouth daily.  Marland Kitchen omeprazole (PRILOSEC) 20 MG capsule Take 1 capsule (20 mg total) by mouth daily.   No facility-administered encounter medications on file as of 07/29/2019.    Goals Addressed            This Visit's Progress   . PharmD - Diabetes Management (pt-stated)       Current Barriers:  . Financial Barriers - patient has [employer] and reports copay for Bydureon & Lantus is cost prohibitive at this time  Pharmacist Clinical Goal(s):  Marland Kitchen Over the next 30 days, patient will work with CM Pharmacist and PCP to address needs related to medication assistance and diabetes management  Interventions: . Counsel on importance of staying hydrated. Reports has been trying to drink more water. . Reports  strength continues to slowly improve post-back surgery. Reports improvement in walking and ability ability to go up and down stairs. o Reports still gets sore with exertion . Counsel patient on importance of blood sugar control and monitoring o Reports taking: - Bydureon 2 mg once weekly - Metformin ER 750 mg BID - [name] (insulin glargine) 19 units daily  Reports used up previous supply of Lantus and now using [name] brand of insulin glargine, as received through [employer] patient assistance  program. o Reports has not been checking CBGs consistently. Over past week only checked: - Yesterday at bedtime: 171 - Today in morning: 151 o Denies any s/s low blood sugar o Reports rarely misses a dose of medications . Encourage patient to restart checking CBGs as directed and keeping log of results . Encourage patient to monitor home BP and keep log ? Reports recently obtained new upper arm BP monitor  Patient Self Care Activities:  . Self administers medications as prescribed with assistance from his wife o Uses weekly pillbox as adherence tool . Attends all scheduled provider appointments . Calls pharmacy for medication refills . Calls provider office for new concerns or questions . To check blood sugars twice daily and keep log   Please see past updates related to this goal by clicking on the "Past Updates" button in the selected goal        Plan  Telephone follow up appointment with care management team member scheduled for: 5/10 at 9 am  [name], PharmD, [facility] Clinical Pharmacist [facility] [phone]

## 2019-08-01 DIAGNOSIS — N3000 Acute cystitis without hematuria: Secondary | ICD-10-CM | POA: Diagnosis not present

## 2019-08-04 LAB — URINE CULTURE

## 2019-08-05 ENCOUNTER — Ambulatory Visit: Payer: Self-pay | Admitting: Pharmacist

## 2019-08-05 DIAGNOSIS — E1121 Type 2 diabetes mellitus with diabetic nephropathy: Secondary | ICD-10-CM | POA: Diagnosis not present

## 2019-08-05 DIAGNOSIS — I10 Essential (primary) hypertension: Secondary | ICD-10-CM

## 2019-08-05 DIAGNOSIS — Z794 Long term (current) use of insulin: Secondary | ICD-10-CM | POA: Diagnosis not present

## 2019-08-05 NOTE — Patient Instructions (Signed)
Thank you allowing the Chronic Care Management Team to be a part of your care! It was a pleasure speaking with you today!     CCM (Chronic Care Management) Team    Alto Denver RN, MSN, CCM Nurse Care Coordinator  [phone]   [name] PharmD  Clinical Pharmacist  [phone]   Dickie La LCSW Clinical Social Worker [phone]  Visit Information  Goals Addressed            This Visit's Progress   . PharmD - Diabetes Management (pt-stated)       CARE PLAN ENTRY (see longitudinal plan of care for additional care plan information)  Current Barriers:  . Chronic Disease Management support, education, and care coordination needs related to type 2 diabetes mellitus, hypertension, hyperlipidemia, GERD, gout, seasonal allergies . Financial Barriers - patient has [employer] and reports copay for Bydureon & insulin glargine is cost prohibitive at this time  Pharmacist Clinical Goal(s):  Marland Kitchen Over the next 30 days, patient will work with CM Pharmacist and PCP to address needs related to medication assistance and diabetes management  Interventions: . Perform chart review . Follow up with patient regarding urinary symptoms. Note patient treated for UTI on 4/1.  o Patient denies any urinary symptoms since completed antibiotic from beginning of April.  o Encouraged patient to stay hydrated. o Collaborate with PCP . Counsel patient on importance of blood sugar control and monitoring o Reports currently taking: - Bydureon 2 mg once weekly - Metformin ER 750 mg BID - [name] (insulin glargine) 19 units daily at bedtime o Review recent blood sugar results (see below) - Reports on 5/3 injected 19 units of insulin aspart, rather than [name] (insulin glargine) in the evening and woke up next morning sweating with CBG of 50.  Mr and Mrs. Cherne unsure of where the insulin aspart came from. Patient states that this was in the fridge and he used it thinking that it  was the same as the insulin glargine.  Reports ate cake to bring up blood sugar o Counsel patient on s/s of low blood sugar and how to treat low blood sugar - Review rules of 15s - using 15 grams of sugar to treat low, recheck blood sugar in 15 minutes, treat again if remains low or, if back to normal, having meal if mealtime or snack  - Review examples of sources of 15 grams of sugar o Advise pateint to discard remaining insulin aspart  . Encourage patient to monitor home BP and keep log ? Reports check BP: ? Last night: 145/86 ? This morning: 145/84 ? Counsel patient on BP monitoring technique ? Encourage patient to record HR with BP readings . Patient reports looking forward to resuming more of his normal activities as his back continues to heal  Patient Self Care Activities:  . Self administers medications as prescribed with assistance from his wife o Uses weekly pillbox as adherence tool . Attends all scheduled provider appointments . Calls pharmacy for medication refills . Calls provider office for new concerns or questions . To check blood sugars twice daily and keep log Date Fasting Blood Glucose Bedtime Notes  4 - May 50* 218 *Sweating with low  5 - May 141 180   6 - May 160 135   7 - May 144 186   8 - May - 228* *Report ate lots of carbs for supper  9 - May 168 219*   10 - May 141  Please see past updates related to this goal by clicking on the "Past Updates" button in the selected goal        Patient verbalizes understanding of instructions provided today.   Telephone follow up appointment with care management team member scheduled for: 6/7 at 42 am  Harlow Asa, PharmD, [place] [phone]

## 2019-08-05 NOTE — Chronic Care Management (AMB) (Signed)
Chronic Care Management   Follow Up Note   08/05/2019 Name: [name] MRN: [id number] DOB: [birth year]  Referred by: [name], DO Reason for referral : Chronic Care Management (Patient Phone Call)   [name] is a 69 y.o. year old male who is a primary care patient of [name], DO. The CCM team was consulted for assistance with chronic disease management and care coordination needs.    I reached out to [name] by phone today.   Review of patient status, including review of consultants reports, relevant laboratory and other test results, and collaboration with appropriate care team members and the patient's provider was performed as part of comprehensive patient evaluation and provision of chronic care management services.     Outpatient Encounter Medications as of 08/05/2019  Medication Sig  . BYDUREON BCISE 2 MG/0.85ML AUIJ INJECT 2 MG SUBCUTANEOUSLY ONCE A WEEK  . Insulin Glargine ([name] KWIKPEN) 100 UNIT/ML Inject 19 Units into the skin at bedtime.  . metFORMIN (GLUCOPHAGE-XR) 750 MG 24 hr tablet Take 2 tablets (1,500 mg total) by mouth daily with breakfast.  . ACCU-CHEK SMARTVIEW test strip USE 1 STRIP TO CHECK GLUCOSE TWICE DAILY  . ACCU-CHEK SMARTVIEW test strip Check sugar 2 times daily  . acetaminophen (TYLENOL) 500 MG tablet Take 1,000 mg by mouth every 8 (eight) hours as needed.  Marland Kitchen allopurinol (ZYLOPRIM) 100 MG tablet TAKE 1 TABLET BY MOUTH AT BEDTIME  . amLODipine (NORVASC) 2.5 MG tablet Take 1 tablet (2.5 mg total) by mouth at bedtime.  . calcium carbonate (TUMS - DOSED IN MG ELEMENTAL CALCIUM) 500 MG chewable tablet Chew 2 tablets by mouth at bedtime as needed for indigestion or heartburn.   . colchicine 0.6 MG tablet Take 1 tablet (0.6 mg total) by mouth as needed (gout flare). (Patient not taking: Reported on 06/27/2019)  . docusate sodium (COLACE) 100 MG capsule Take 100 mg by mouth 2 (two) times daily.  .  fluticasone (FLONASE) 50 MCG/ACT nasal spray USE 2 SPRAYS IN EACH NOSTRIL ONCE DAILY USE FOR 4 TO 6 WEEKS, THEN STOP AND USE SEASONALLY OR AS NEEDED.  Marland Kitchen Insulin Pen Needle (NOVOFINE PLUS) 32G X 4 MM MISC Use with Lantus insulin daily injection as instructed  . lisinopril (ZESTRIL) 40 MG tablet Take 1 tablet (40 mg total) by mouth daily.  Marland Kitchen omeprazole (PRILOSEC) 20 MG capsule Take 1 capsule (20 mg total) by mouth daily.  . [DISCONTINUED] ciprofloxacin (CIPRO) 500 MG tablet Take 1 tablet (500 mg total) by mouth 2 (two) times daily.   No facility-administered encounter medications on file as of 08/05/2019.    Goals Addressed            This Visit's Progress   . PharmD - Diabetes Management (pt-stated)       CARE PLAN ENTRY (see longitudinal plan of care for additional care plan information)  Current Barriers:  . Chronic Disease Management support, education, and care coordination needs related to type 2 diabetes mellitus, hypertension, hyperlipidemia, GERD, gout, seasonal allergies . Financial Barriers - patient has [employer] and reports copay for Bydureon & insulin glargine is cost prohibitive at this time  Pharmacist Clinical Goal(s):  Marland Kitchen Over the next 30 days, patient will work with CM Pharmacist and PCP to address needs related to medication assistance and diabetes management  Interventions: . Perform chart review . Follow up with patient regarding urinary symptoms. Note patient treated for UTI on 4/1.  o Patient denies  any urinary symptoms since completed antibiotic from beginning of April.  o Encouraged patient to stay hydrated. o Collaborate with PCP . Counsel patient on importance of blood sugar control and monitoring o Reports currently taking: - Bydureon 2 mg once weekly - Metformin ER 750 mg BID - [name] (insulin glargine) 19 units daily at bedtime o Review recent blood sugar results (see below) - Reports on 5/3 injected 19 units of insulin aspart, rather  than [name] (insulin glargine) in the evening and woke up next morning sweating with CBG of 50.  Mr and Mrs. Munos unsure of where the insulin aspart came from. Patient states that this was in the fridge and he used it thinking that it was the same as the insulin glargine.  Reports ate cake to bring up blood sugar o Counsel patient on s/s of low blood sugar and how to treat low blood sugar - Review rules of 15s - using 15 grams of sugar to treat low, recheck blood sugar in 15 minutes, treat again if remains low or, if back to normal, having meal if mealtime or snack  - Review examples of sources of 15 grams of sugar o Advise pateint to discard remaining insulin aspart  . Encourage patient to monitor home BP and keep log ? Reports check BP: ? Last night: 145/86 ? This morning: 145/84 ? Counsel patient on BP monitoring technique ? Encourage patient to record HR with BP readings . Patient reports looking forward to resuming more of his normal activities as his back continues to heal  Patient Self Care Activities:  . Self administers medications as prescribed with assistance from his wife o Uses weekly pillbox as adherence tool . Attends all scheduled provider appointments . Calls pharmacy for medication refills . Calls provider office for new concerns or questions . To check blood sugars twice daily and keep log Date Fasting Blood Glucose Bedtime Notes  4 - May 50* 218 *Sweating with low  5 - May 141 180   6 - May 160 135   7 - May 144 186   8 - May - 228* *Report ate lots of carbs for supper  9 - May 168 219*   10 - May 141       Please see past updates related to this goal by clicking on the "Past Updates" button in the selected goal        Plan  Telephone follow up appointment with care management team member scheduled for: 6/7 at 10 am  [name], PharmD, [facility] Clinical Pharmacist [facility] [phone]

## 2019-09-02 ENCOUNTER — Ambulatory Visit: Payer: Self-pay | Admitting: Pharmacist

## 2019-09-02 DIAGNOSIS — E1121 Type 2 diabetes mellitus with diabetic nephropathy: Secondary | ICD-10-CM

## 2019-09-02 DIAGNOSIS — Z794 Long term (current) use of insulin: Secondary | ICD-10-CM

## 2019-09-02 NOTE — Patient Instructions (Signed)
Thank you allowing the Chronic Care Management Team to be a part of your care! It was a pleasure speaking with you today!     CCM (Chronic Care Management) Team    Alto Denver RN, MSN, CCM Nurse Care Coordinator  [phone]   [name] PharmD  Clinical Pharmacist  [phone]   Dickie La LCSW Clinical Social Worker [phone]  Visit Information  Goals Addressed            This Visit's Progress   . PharmD - Diabetes Management (pt-stated)       CARE PLAN ENTRY (see longitudinal plan of care for additional care plan information)  Current Barriers:  . Chronic Disease Management support, education, and care coordination needs related to type 2 diabetes mellitus, hypertension, hyperlipidemia, GERD, gout, seasonal allergies . Financial Barriers - patient has [employer] and reports copay for Bydureon & insulin glargine is cost prohibitive at this time  Pharmacist Clinical Goal(s):  Marland Kitchen Over the next 30 days, patient will work with CM Pharmacist and PCP to address needs related to medication assistance and diabetes management  Interventions: . Unable to speak with patient today. 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  . Discuss importance of blood sugar monitoring and control. Reports patient's blood sugars have been higher over the past few days due to eating more sweets o Reports recent morning fasting CBGs ranging: 170-236 o Reports recent bedtime CBGs: 229-240 o Unable to confirm dose of [name] patient currently taking o Note patient also takes: Bydureon 2 mg weekly and metformin ER 750 - 2 tablets (1,500 mg) daily . Discuss importance of patient having balanced diet and limiting carbohydrate portion size . Reschedule time for follow up of diabetes management with patient   Patient Self Care Activities:  . Self administers medications as prescribed with assistance from his wife o Uses weekly pillbox as  adherence tool . Attends all scheduled provider appointments . Calls pharmacy for medication refills . Calls provider office for new concerns or questions . To check blood sugars twice daily and keep log   Please see past updates related to this goal by clicking on the "Past Updates" button in the selected goal        Patient's wife verbalizes understanding of instructions provided today.   Telephone follow up appointment with care management team member scheduled for: 6/16 at 3:30 pm  [name], PharmD, [facility] Clinical Pharmacist [facility] [employer] [phone]

## 2019-09-02 NOTE — Chronic Care Management (AMB) (Signed)
Chronic Care Management   Follow Up Note   09/02/2019 Name: [name] MRN: [id number] DOB: November 13, 1950  Referred by: [name], DO Reason for referral : Chronic Care Management (Patient Phone Call)   [name] is a 69 y.o. year old male who is a primary care patient of [name], DO. The CCM team was consulted for assistance with chronic disease management and care coordination needs.    I reached out to [name] by phone today. Unable to speak with patient. Speak with patient's wife, [name], who reports patient is not home.  Review of patient status, including review of consultants reports, relevant laboratory and other test results, and collaboration with appropriate care team members and the patient's provider was performed as part of comprehensive patient evaluation and provision of chronic care management services.      Outpatient Encounter Medications as of 09/02/2019  Medication Sig  . BYDUREON BCISE 2 MG/0.85ML AUIJ INJECT 2 MG SUBCUTANEOUSLY ONCE A WEEK  . ACCU-CHEK SMARTVIEW test strip USE 1 STRIP TO CHECK GLUCOSE TWICE DAILY  . ACCU-CHEK SMARTVIEW test strip Check sugar 2 times daily  . acetaminophen (TYLENOL) 500 MG tablet Take 1,000 mg by mouth every 8 (eight) hours as needed.  Marland Kitchen allopurinol (ZYLOPRIM) 100 MG tablet TAKE 1 TABLET BY MOUTH AT BEDTIME  . amLODipine (NORVASC) 2.5 MG tablet Take 1 tablet (2.5 mg total) by mouth at bedtime.  . calcium carbonate (TUMS - DOSED IN MG ELEMENTAL CALCIUM) 500 MG chewable tablet Chew 2 tablets by mouth at bedtime as needed for indigestion or heartburn.   . colchicine 0.6 MG tablet Take 1 tablet (0.6 mg total) by mouth as needed (gout flare). (Patient not taking: Reported on 06/27/2019)  . docusate sodium (COLACE) 100 MG capsule Take 100 mg by mouth 2 (two) times daily.  . fluticasone (FLONASE) 50 MCG/ACT nasal spray USE 2 SPRAYS IN EACH NOSTRIL ONCE DAILY USE FOR 4 TO 6 WEEKS, THEN  STOP AND USE SEASONALLY OR AS NEEDED.  Marland Kitchen Insulin Glargine ([name] KWIKPEN) 100 UNIT/ML Inject 19 Units into the skin at bedtime.  . Insulin Pen Needle (NOVOFINE PLUS) 32G X 4 MM MISC Use with Lantus insulin daily injection as instructed  . lisinopril (ZESTRIL) 40 MG tablet Take 1 tablet (40 mg total) by mouth daily.  . metFORMIN (GLUCOPHAGE-XR) 750 MG 24 hr tablet Take 2 tablets (1,500 mg total) by mouth daily with breakfast.  . omeprazole (PRILOSEC) 20 MG capsule Take 1 capsule (20 mg total) by mouth daily.   No facility-administered encounter medications on file as of 09/02/2019.    Goals Addressed            This Visit's Progress   . PharmD - Diabetes Management (pt-stated)       CARE PLAN ENTRY (see longitudinal plan of care for additional care plan information)  Current Barriers:  . Chronic Disease Management support, education, and care coordination needs related to type 2 diabetes mellitus, hypertension, hyperlipidemia, GERD, gout, seasonal allergies . Financial Barriers - patient has [employer] and reports copay for Bydureon & insulin glargine is cost prohibitive at this time  Pharmacist Clinical Goal(s):  Marland Kitchen Over the next 30 days, patient will work with CM Pharmacist and PCP to address needs related to medication assistance and diabetes management  Interventions: . Unable to speak with patient today. 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  . Discuss importance of blood sugar  monitoring and control. Reports patient's blood sugars have been higher over the past few days due to eating more sweets o Reports recent morning fasting CBGs ranging: 170-236 o Reports recent bedtime CBGs: 229-240 o Unable to confirm dose of [name] patient currently taking o Note patient also takes: Bydureon 2 mg weekly and metformin ER 750 - 2 tablets (1,500 mg) daily . Discuss importance of patient having balanced diet and limiting carbohydrate portion  size . Reschedule time for follow up of diabetes management with patient   Patient Self Care Activities:  . Self administers medications as prescribed with assistance from his wife o Uses weekly pillbox as adherence tool . Attends all scheduled provider appointments . Calls pharmacy for medication refills . Calls provider office for new concerns or questions . To check blood sugars twice daily and keep log   Please see past updates related to this goal by clicking on the "Past Updates" button in the selected goal        Plan  Telephone follow up appointment with care management team member scheduled for: 6/16 at 3:30 pm  Harlow Asa, PharmD, [place] [phone]

## 2019-09-11 ENCOUNTER — Ambulatory Visit (INDEPENDENT_AMBULATORY_CARE_PROVIDER_SITE_OTHER): Payer: Medicare HMO | Admitting: Pharmacist

## 2019-09-11 DIAGNOSIS — Z794 Long term (current) use of insulin: Secondary | ICD-10-CM

## 2019-09-11 DIAGNOSIS — E1121 Type 2 diabetes mellitus with diabetic nephropathy: Secondary | ICD-10-CM

## 2019-09-11 DIAGNOSIS — I10 Essential (primary) hypertension: Secondary | ICD-10-CM

## 2019-09-11 NOTE — Chronic Care Management (AMB) (Signed)
Chronic Care Management   Follow Up Note   09/11/2019 Name: [name] MRN: [id number] DOB: Feb 11, 1951  Referred by: Daniel Hauser, DO Reason for referral : Chronic Care Management (Patient Phone Call)   [name] is a 69 y.o. year old male who is a primary care patient of Daniel Hauser, DO. The CCM team was consulted for assistance with chronic disease management and care coordination needs.    I reached out to [name] by phone today.   Review of patient status, including review of consultants reports, relevant laboratory and other test results, and collaboration with appropriate care team members and the patient's provider was performed as part of comprehensive patient evaluation and provision of chronic care management services.     Outpatient Encounter Medications as of 09/11/2019  Medication Sig  . BYDUREON BCISE 2 MG/0.85ML AUIJ INJECT 2 MG SUBCUTANEOUSLY ONCE A WEEK  . Insulin Glargine ([name] KWIKPEN) 100 UNIT/ML Inject 19 Units into the skin at bedtime.  . metFORMIN (GLUCOPHAGE-XR) 750 MG 24 hr tablet Take 2 tablets (1,500 mg total) by mouth daily with breakfast.  . ACCU-CHEK SMARTVIEW test strip USE 1 STRIP TO CHECK GLUCOSE TWICE DAILY  . ACCU-CHEK SMARTVIEW test strip Check sugar 2 times daily  . acetaminophen (TYLENOL) 500 MG tablet Take 1,000 mg by mouth every 8 (eight) hours as needed.  Marland Kitchen allopurinol (ZYLOPRIM) 100 MG tablet TAKE 1 TABLET BY MOUTH AT BEDTIME  . amLODipine (NORVASC) 2.5 MG tablet Take 1 tablet (2.5 mg total) by mouth at bedtime.  . calcium carbonate (TUMS - DOSED IN MG ELEMENTAL CALCIUM) 500 MG chewable tablet Chew 2 tablets by mouth at bedtime as needed for indigestion or heartburn.   . colchicine 0.6 MG tablet Take 1 tablet (0.6 mg total) by mouth as needed (gout flare). (Patient not taking: Reported on 06/27/2019)  . docusate sodium (COLACE) 100 MG capsule Take 100 mg by mouth 2 (two) times daily.  .  fluticasone (FLONASE) 50 MCG/ACT nasal spray USE 2 SPRAYS IN EACH NOSTRIL ONCE DAILY USE FOR 4 TO 6 WEEKS, THEN STOP AND USE SEASONALLY OR AS NEEDED.  Marland Kitchen Insulin Pen Needle (NOVOFINE PLUS) 32G X 4 MM MISC Use with Lantus insulin daily injection as instructed  . lisinopril (ZESTRIL) 40 MG tablet Take 1 tablet (40 mg total) by mouth daily.  Marland Kitchen omeprazole (PRILOSEC) 20 MG capsule Take 1 capsule (20 mg total) by mouth daily.   No facility-administered encounter medications on file as of 09/11/2019.    Goals Addressed              This Visit's Progress   .  PharmD - Diabetes Management (pt-stated)        CARE PLAN ENTRY (see longitudinal plan of care for additional care plan information)  Current Barriers:  . Chronic Disease Management support, education, and care coordination needs related to type 2 diabetes mellitus, hypertension, hyperlipidemia, GERD, gout, seasonal allergies . Financial Barriers - patient has [employer] and reports copay for Bydureon & insulin glargine is cost prohibitive at this time  Pharmacist Clinical Goal(s):  Marland Kitchen Over the next 30 days, patient will work with CM Pharmacist and PCP to address needs related to medication assistance and diabetes management  Interventions: . Discuss importance of blood sugar monitoring and control.  o Reports taking: - Bydureon 2 mg once weekly - [name] (insulin glargine) 19 units daily - Metformin ER 750 mg BID o Review recent CBGs (see below) -  Discuss correlation between carbohydrate portion sizes and elevated readings. Patient states that he has been eating bigger meals recently. o States wants to make the following goal to improve with blood sugar conrtol: cutting back on sweets and cutting back on bread - Reports has gained ~8 pounds over past few months and wants to lose this weight  Encourage patient to monitor home BP and keep log  Discuss impact of exercise on blood sugar and blood pressure  States  enjoys being active and reports recently increasing exercise as is back to working most days    Patient Self Care Activities:  . Self administers medications as prescribed with assistance from his wife o Uses weekly pillbox as adherence tool . Attends all scheduled provider appointments . Calls pharmacy for medication refills . Calls provider office for new concerns or questions . To check blood sugars twice daily and keep log  Fasting Blood Glucose ~2 Hours After Lunch Bedtime Notes  9 - June 168  -   10 - June 172  231   11 - June -  225   12 - June -  -   13 - June 157  217* *Carbohydrate snacks during day  14 - June 171  190* *Pizza at lunch  15 - June 196  167   16 - June 195* 168  *Ate out last night  Average 176  206       Please see past updates related to this goal by clicking on the "Past Updates" button in the selected goal        Plan  Telephone follow up appointment with care management team member scheduled for: 7/14 at 4 pm  [name], PharmD, [facility] Clinical Pharmacist [facility] [employer] [phone]

## 2019-09-11 NOTE — Patient Instructions (Signed)
Thank you allowing the Chronic Care Management Team to be a part of your care! It was a pleasure speaking with you today!     CCM (Chronic Care Management) Team    Alto Denver RN, MSN, CCM Nurse Care Coordinator  [phone]   [name] PharmD  Clinical Pharmacist  [phone]   Dickie La LCSW Clinical Social Worker [phone]  Visit Information  Goals Addressed              This Visit's Progress   .  PharmD - Diabetes Management (pt-stated)        CARE PLAN ENTRY (see longitudinal plan of care for additional care plan information)  Current Barriers:  . Chronic Disease Management support, education, and care coordination needs related to type 2 diabetes mellitus, hypertension, hyperlipidemia, GERD, gout, seasonal allergies . Financial Barriers - patient has [employer] and reports copay for Bydureon & insulin glargine is cost prohibitive at this time  Pharmacist Clinical Goal(s):  Marland Kitchen Over the next 30 days, patient will work with CM Pharmacist and PCP to address needs related to medication assistance and diabetes management  Interventions: . Discuss importance of blood sugar monitoring and control.  o Reports taking: - Bydureon 2 mg once weekly - [name] (insulin glargine) 19 units daily - Metformin ER 750 mg BID o Review recent CBGs (see below) - Discuss correlation between carbohydrate portion sizes and elevated readings. Patient states that he has been eating bigger meals recently. o States wants to make the following goal to improve with blood sugar conrtol: cutting back on sweets and cutting back on bread - Reports has gained ~8 pounds over past few months and wants to lose this weight  Encourage patient to monitor home BP and keep log  Discuss impact of exercise on blood sugar and blood pressure  States enjoys being active and reports recently increasing exercise as is back to working most days    Patient Self Care Activities:    . Self administers medications as prescribed with assistance from his wife o Uses weekly pillbox as adherence tool . Attends all scheduled provider appointments . Calls pharmacy for medication refills . Calls provider office for new concerns or questions . To check blood sugars twice daily and keep log  Fasting Blood Glucose ~2 Hours After Lunch Bedtime Notes  9 - June 168  -   10 - June 172  231   11 - June -  225   12 - June -  -   13 - June 157  217* *Carbohydrate snacks during day  14 - June 171  190* *Pizza at lunch  15 - June 196  167   16 - June 195* 168  *Ate out last night  Average 176  206       Please see past updates related to this goal by clicking on the "Past Updates" button in the selected goal        Patient verbalizes understanding of instructions provided today.   Telephone follow up appointment with care management team member scheduled for: 7/14 at 4 pm  [name], PharmD, [facility] Clinical Pharmacist [facility] [employer] [phone]

## 2019-09-18 ENCOUNTER — Telehealth: Payer: Self-pay

## 2019-09-18 NOTE — Telephone Encounter (Signed)
Received form faxed from insurance about offering statin therapy to diabetic patient and response was requested.  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  , DO [facility] 09/18/2019, 5:53 PM

## 2019-09-18 NOTE — Telephone Encounter (Signed)
Copied from CRM [id number]. Topic: General - Inquiry >> Sep 18, 2019 11:31 AM Crist Infante wrote: Reason for CRM: Caitlyn with [facility] states she has sent several faxes concerning drug therapy alert concerning statin use,. But not heard back. Cb  [phone]  Will refax as well

## 2019-09-18 NOTE — Telephone Encounter (Signed)
Spoke to [employer] wants reply back for yes/no answer regarding statin recommendation and fax it back to [employer]. 

## 2019-10-09 ENCOUNTER — Ambulatory Visit (INDEPENDENT_AMBULATORY_CARE_PROVIDER_SITE_OTHER): Payer: Medicare HMO | Admitting: Pharmacist

## 2019-10-09 DIAGNOSIS — E1121 Type 2 diabetes mellitus with diabetic nephropathy: Secondary | ICD-10-CM | POA: Diagnosis not present

## 2019-10-09 DIAGNOSIS — Z794 Long term (current) use of insulin: Secondary | ICD-10-CM | POA: Diagnosis not present

## 2019-10-09 NOTE — Patient Instructions (Signed)
Thank you allowing the Chronic Care Management Team to be a part of your care! It was a pleasure speaking with you today!     CCM (Chronic Care Management) Team    Alto Denver RN, MSN, CCM Nurse Care Coordinator  [phone]   [name] PharmD  Clinical Pharmacist  [phone]   Dickie La LCSW Clinical Social Worker [phone]  Visit Information  Goals Addressed              This Visit's Progress   .  PharmD - Diabetes Management (pt-stated)        CARE PLAN ENTRY (see longitudinal plan of care for additional care plan information)  Current Barriers:  . Chronic Disease Management support, education, and care coordination needs related to type 2 diabetes mellitus, hypertension, hyperlipidemia, GERD, gout, seasonal allergies . Financial Barriers - patient has [employer] and reports copay for Bydureon & insulin glargine is cost prohibitive at this time  Pharmacist Clinical Goal(s):  Marland Kitchen Over the next 30 days, patient will work with CM Pharmacist and PCP to address needs related to medication assistance and diabetes management  Interventions: . Discuss importance of blood sugar monitoring and control.  o Reports taking: - Bydureon 2 mg once weekly - [name] (insulin glargine) 19 units daily - Metformin ER 750 mg BID o Review recent CBGs:  Fasting Blood Glucose Bedtime Notes  8 - July  - 185   9 - July 148 -   10 - July 131 -   11 - July - 140   12 - July - 174   13 - July 239* 176 *Missed metformin day before  14 - July 227  *Ice cream overnight   - Discuss correlation between carbohydrate portion sizes and elevated readings. - Reports occasionally misses checking morning CBGs as he is getting up early to start work when cooler outside o Reports trying to improve his diet and has lost ~2-3 lbs over past month . Discuss impact of exercise on blood sugar. Note patient active most days through work o Discuss importance of staying  hydrated . Review process for ordering refills of [name] and Bydureon through assistance programs with patient and wife. Confirm has contact phone numbers for each program. Encourage to call today for refills   Patient Self Care Activities:  . Self administers medications as prescribed with assistance from his wife o Uses weekly pillbox as adherence tool . Attends all scheduled provider appointments . Calls pharmacy for medication refills . Calls provider office for new concerns or questions . To check blood sugars twice daily and keep log   Please see past updates related to this goal by clicking on the "Past Updates" button in the selected goal        Patient verbalizes understanding of instructions provided today.   Telephone follow up appointment with care management team member scheduled for: 8/11 at 4 pm  [name], PharmD, [facility] Clinical Pharmacist [facility] [employer] [phone]

## 2019-10-09 NOTE — Chronic Care Management (AMB) (Signed)
Chronic Care Management   Follow Up Note   10/09/2019 Name: Obryan Radu MRN: [id number] DOB: 1951/01/20  Referred by: [name], DO Reason for referral : Chronic Care Management (Patient Phone Call)   Abanoub Hanken is a 69 y.o. year old male who is a primary care patient of [name], DO. The CCM team was consulted for assistance with chronic disease management and care coordination needs.    I reached out to [name] and his wife by phone today.   Review of patient status, including review of consultants reports, relevant laboratory and other test results, and collaboration with appropriate care team members and the patient's provider was performed as part of comprehensive patient evaluation and provision of chronic care management services.     Outpatient Encounter Medications as of 10/09/2019  Medication Sig  . BYDUREON BCISE 2 MG/0.85ML AUIJ INJECT 2 MG SUBCUTANEOUSLY ONCE A WEEK  . Insulin Glargine ([name] KWIKPEN) 100 UNIT/ML Inject 19 Units into the skin at bedtime.  . metFORMIN (GLUCOPHAGE-XR) 750 MG 24 hr tablet Take 2 tablets (1,500 mg total) by mouth daily with breakfast.  . ACCU-CHEK SMARTVIEW test strip USE 1 STRIP TO CHECK GLUCOSE TWICE DAILY  . ACCU-CHEK SMARTVIEW test strip Check sugar 2 times daily  . acetaminophen (TYLENOL) 500 MG tablet Take 1,000 mg by mouth every 8 (eight) hours as needed.  Marland Kitchen allopurinol (ZYLOPRIM) 100 MG tablet TAKE 1 TABLET BY MOUTH AT BEDTIME  . amLODipine (NORVASC) 2.5 MG tablet Take 1 tablet (2.5 mg total) by mouth at bedtime.  . calcium carbonate (TUMS - DOSED IN MG ELEMENTAL CALCIUM) 500 MG chewable tablet Chew 2 tablets by mouth at bedtime as needed for indigestion or heartburn.   . colchicine 0.6 MG tablet Take 1 tablet (0.6 mg total) by mouth as needed (gout flare). (Patient not taking: Reported on 06/27/2019)  . docusate sodium (COLACE) 100 MG capsule Take 100 mg by mouth 2 (two) times  daily.  . fluticasone (FLONASE) 50 MCG/ACT nasal spray USE 2 SPRAYS IN EACH NOSTRIL ONCE DAILY USE FOR 4 TO 6 WEEKS, THEN STOP AND USE SEASONALLY OR AS NEEDED.  Marland Kitchen Insulin Pen Needle (NOVOFINE PLUS) 32G X 4 MM MISC Use with Lantus insulin daily injection as instructed  . lisinopril (ZESTRIL) 40 MG tablet Take 1 tablet (40 mg total) by mouth daily.  Marland Kitchen omeprazole (PRILOSEC) 20 MG capsule Take 1 capsule (20 mg total) by mouth daily.   No facility-administered encounter medications on file as of 10/09/2019.    Goals Addressed              This Visit's Progress   .  PharmD - Diabetes Management (pt-stated)        CARE PLAN ENTRY (see longitudinal plan of care for additional care plan information)  Current Barriers:  . Chronic Disease Management support, education, and care coordination needs related to type 2 diabetes mellitus, hypertension, hyperlipidemia, GERD, gout, seasonal allergies . Financial Barriers - patient has [employer] and reports copay for Bydureon & insulin glargine is cost prohibitive at this time  Pharmacist Clinical Goal(s):  Marland Kitchen Over the next 30 days, patient will work with CM Pharmacist and PCP to address needs related to medication assistance and diabetes management  Interventions: . Discuss importance of blood sugar monitoring and control.  o Reports taking: - Bydureon 2 mg once weekly - [name] (insulin glargine) 19 units daily - Metformin ER 750 mg BID o Review recent CBGs:  Fasting Blood Glucose Bedtime Notes  8 - July  - 185   9 - July 148 -   10 - July 131 -   11 - July - 140   12 - July - 174   13 - July 239* 176 *Missed metformin day before  14 - July 227  *Ice cream overnight   - Discuss correlation between carbohydrate portion sizes and elevated readings. - Reports occasionally misses checking morning CBGs as he is getting up early to start work when cooler outside o Reports trying to improve his diet and has lost ~2-3 lbs over past  month . Discuss impact of exercise on blood sugar. Note patient active most days through work o Discuss importance of staying hydrated . Review process for ordering refills of [name] and Bydureon through assistance programs with patient and wife. Confirm has contact phone numbers for each program. Encourage to call today for refills   Patient Self Care Activities:  . Self administers medications as prescribed with assistance from his wife o Uses weekly pillbox as adherence tool . Attends all scheduled provider appointments . Calls pharmacy for medication refills . Calls provider office for new concerns or questions . To check blood sugars twice daily and keep log   Please see past updates related to this goal by clicking on the "Past Updates" button in the selected goal        Plan  Telephone follow up appointment with care management team member scheduled for: 8/11 at 4 pm  [name], PharmD, [facility] Clinical Pharmacist [facility] [phone]

## 2019-10-25 ENCOUNTER — Other Ambulatory Visit: Payer: Self-pay | Admitting: Family Medicine

## 2019-10-25 DIAGNOSIS — IMO0002 Reserved for concepts with insufficient information to code with codable children: Secondary | ICD-10-CM

## 2019-10-25 DIAGNOSIS — E1165 Type 2 diabetes mellitus with hyperglycemia: Secondary | ICD-10-CM

## 2019-10-25 DIAGNOSIS — M1000 Idiopathic gout, unspecified site: Secondary | ICD-10-CM

## 2019-10-25 DIAGNOSIS — I10 Essential (primary) hypertension: Secondary | ICD-10-CM

## 2019-10-25 DIAGNOSIS — K219 Gastro-esophageal reflux disease without esophagitis: Secondary | ICD-10-CM

## 2019-11-06 ENCOUNTER — Ambulatory Visit (INDEPENDENT_AMBULATORY_CARE_PROVIDER_SITE_OTHER): Payer: Medicare HMO | Admitting: Pharmacist

## 2019-11-06 DIAGNOSIS — I10 Essential (primary) hypertension: Secondary | ICD-10-CM

## 2019-11-06 DIAGNOSIS — E1121 Type 2 diabetes mellitus with diabetic nephropathy: Secondary | ICD-10-CM

## 2019-11-06 DIAGNOSIS — Z794 Long term (current) use of insulin: Secondary | ICD-10-CM | POA: Diagnosis not present

## 2019-11-06 NOTE — Chronic Care Management (AMB) (Signed)
Chronic Care Management   Follow Up Note   11/06/2019 Name: [name] MRN: [id number] DOB: 1951/03/21  Referred by: [name], DO Reason for referral : Chronic Care Management (Patient Phone Call)   [name] is a 69 y.o. year old male who is a primary care patient of [name], DO. The CCM team was consulted for assistance with chronic disease management and care coordination needs.    I reached out to [name] by phone today.   Review of patient status, including review of consultants reports, relevant laboratory and other test results, and collaboration with appropriate care team members and the patient's provider was performed as part of comprehensive patient evaluation and provision of chronic care management services.     Outpatient Encounter Medications as of 11/06/2019  Medication Sig  . BYDUREON BCISE 2 MG/0.85ML AUIJ INJECT 2 MG SUBCUTANEOUSLY ONCE A WEEK  . ACCU-CHEK SMARTVIEW test strip USE 1 STRIP TO CHECK GLUCOSE TWICE DAILY  . ACCU-CHEK SMARTVIEW test strip Check sugar 2 times daily  . acetaminophen (TYLENOL) 500 MG tablet Take 1,000 mg by mouth every 8 (eight) hours as needed.  Marland Kitchen allopurinol (ZYLOPRIM) 100 MG tablet TAKE 1 TABLET AT BEDTIME  . amLODipine (NORVASC) 2.5 MG tablet TAKE 1 TABLET AT BEDTIME  . calcium carbonate (TUMS - DOSED IN MG ELEMENTAL CALCIUM) 500 MG chewable tablet Chew 2 tablets by mouth at bedtime as needed for indigestion or heartburn.   . colchicine 0.6 MG tablet Take 1 tablet (0.6 mg total) by mouth as needed (gout flare). (Patient not taking: Reported on 06/27/2019)  . docusate sodium (COLACE) 100 MG capsule Take 100 mg by mouth 2 (two) times daily.  . fluticasone (FLONASE) 50 MCG/ACT nasal spray USE 2 SPRAYS IN EACH NOSTRIL ONCE DAILY USE FOR 4 TO 6 WEEKS, THEN STOP AND USE SEASONALLY OR AS NEEDED.  Marland Kitchen Insulin Glargine ([name] KWIKPEN) 100 UNIT/ML Inject 19 Units into the skin at bedtime.   . Insulin Pen Needle (NOVOFINE PLUS) 32G X 4 MM MISC Use with Lantus insulin daily injection as instructed  . lisinopril (ZESTRIL) 40 MG tablet TAKE 1 TABLET EVERY DAY  . metFORMIN (GLUCOPHAGE-XR) 750 MG 24 hr tablet TAKE 2 TABLETS EVERY DAY WITH BREAKFAST  . omeprazole (PRILOSEC) 20 MG capsule TAKE 1 CAPSULE EVERY DAY   No facility-administered encounter medications on file as of 11/06/2019.    Goals Addressed              This Visit's Progress   .  PharmD - Diabetes Management (pt-stated)        CARE PLAN ENTRY (see longitudinal plan of care for additional care plan information)  Current Barriers:  . Chronic Disease Management support, education, and care coordination needs related to type 2 diabetes mellitus, hypertension, hyperlipidemia, GERD, gout, seasonal allergies . Financial Barriers - patient has [employer] and reports copay for Bydureon & insulin glargine is cost prohibitive at this time  Pharmacist Clinical Goal(s):  Marland Kitchen Over the next 30 days, patient will work with CM Pharmacist and PCP to address needs related to medication assistance and diabetes management  Interventions: . Discuss importance of blood sugar monitoring and control.  o Reports taking: - Bydureon 2 mg once weekly - [name] (insulin glargine) 19 units daily - Metformin ER 750 mg BID o Review recent CBGs:  Fasting Blood Glucose After Snack After Supper Notes  4 - August 159  234* *Hot fudge cake  5 -  August -  190   6 - August 126  229   7 - August - - -   8 - August - - 172   9 - August - - -   10 - August 147 - 206* *Large supper  11 - August 187* 174     - Discuss correlation between carbohydrate portion sizes and elevated readings. - Reports occasionally misses checking morning CBGs as he is getting up early to start work o Psychologist, sport and exercise on reviewing nutrition labels for carbohydrate content o Encourage patient to continue to check blood sugar, keep log and bring record to next PCP  appointment . Discuss impact of exercise on blood sugar. Note patient active most days through work o Discuss importance of staying hydrated . Confirms has recently obtained refills of [name] and Bydureon through assistance programs . Counsel on importance of blood pressure control and monitoring o Denies recently checking home BP o Encourage patient to restart checking home BP, keep log and bring record with him to next PCP appointment o Discuss impact of salt/sodium on blood pressure . Coordination of care: Encourage patient to call to schedule visit with PCP. Note patient due for office visit/A1C . Discuss continued importance of COVID-19 prevention o Patient received Pfizer COVID-19 vaccination - provides dates: 1st dose on 05/15/19; 2nd dose on 06/05/19 (update record in chart)  Patient Self Care Activities:  . Self administers medications as prescribed with assistance from his wife o Uses weekly pillbox as adherence tool . Attends all scheduled provider appointments . Calls pharmacy for medication refills . Calls provider office for new concerns or questions . To check blood sugars twice daily and keep log   Please see past updates related to this goal by clicking on the "Past Updates" button in the selected goal        Plan  Telephone follow up appointment with care management team member scheduled for: 10/11 at 4 pm  [name], PharmD, [facility] Clinical Pharmacist [facility] [phone]

## 2019-11-06 NOTE — Patient Instructions (Signed)
Thank you allowing the Chronic Care Management Team to be a part of your care! It was a pleasure speaking with you today!     CCM (Chronic Care Management) Team    Alto Denver RN, MSN, CCM Nurse Care Coordinator  [phone]   [name] PharmD  Clinical Pharmacist  [phone]   Dickie La LCSW Clinical Social Worker [phone]  Visit Information  Goals Addressed              This Visit's Progress   .  PharmD - Diabetes Management (pt-stated)        CARE PLAN ENTRY (see longitudinal plan of care for additional care plan information)  Current Barriers:  . Chronic Disease Management support, education, and care coordination needs related to type 2 diabetes mellitus, hypertension, hyperlipidemia, GERD, gout, seasonal allergies . Financial Barriers - patient has [employer] and reports copay for Bydureon & insulin glargine is cost prohibitive at this time  Pharmacist Clinical Goal(s):  Marland Kitchen Over the next 30 days, patient will work with CM Pharmacist and PCP to address needs related to medication assistance and diabetes management  Interventions: . Discuss importance of blood sugar monitoring and control.  o Reports taking: - Bydureon 2 mg once weekly - [name] (insulin glargine) 19 units daily - Metformin ER 750 mg BID o Review recent CBGs:  Fasting Blood Glucose After Snack After Supper Notes  4 - August 159  234* *Hot fudge cake  5 - August -  190   6 - August 126  229   7 - August - - -   8 - August - - 172   9 - August - - -   10 - August 147 - 206* *Large supper  11 - August 187* 174     - Discuss correlation between carbohydrate portion sizes and elevated readings. - Reports occasionally misses checking morning CBGs as he is getting up early to start work o Psychologist, sport and exercise on reviewing nutrition labels for carbohydrate content o Encourage patient to continue to check blood sugar, keep log and bring record to next PCP appointment . Discuss  impact of exercise on blood sugar. Note patient active most days through work o Discuss importance of staying hydrated . Confirms has recently obtained refills of [name] and Bydureon through assistance programs . Counsel on importance of blood pressure control and monitoring o Denies recently checking home BP o Encourage patient to restart checking home BP, keep log and bring record with him to next PCP appointment o Discuss impact of salt/sodium on blood pressure . Coordination of care: Encourage patient to call to schedule visit with PCP. Note patient due for office visit/A1C . Discuss continued importance of COVID-19 prevention o Patient received Pfizer COVID-19 vaccination - provides dates: 1st dose on 05/15/19; 2nd dose on 06/05/19 (update record in chart)  Patient Self Care Activities:  . Self administers medications as prescribed with assistance from his wife o Uses weekly pillbox as adherence tool . Attends all scheduled provider appointments . Calls pharmacy for medication refills . Calls provider office for new concerns or questions . To check blood sugars twice daily and keep log   Please see past updates related to this goal by clicking on the "Past Updates" button in the selected goal        Patient verbalizes understanding of instructions provided today.   Telephone follow up appointment with care management team member scheduled for: 10/11 at 4 pm  [name], PharmD,  [place] [employer] [phone]

## 2019-11-06 NOTE — Chronic Care Management (AMB) (Signed)
Chronic Care Management   Follow Up Note   11/06/2019 Name: [name] MRN: [id number] DOB: 12/29/1950  Referred by: Karamalegos, Alexander J, DO Reason for referral : Chronic Care Management (Patient Phone Call)   [name] is a 69 y.o. year old male who is a primary care patient of Karamalegos, Alexander J, DO. The CCM team was consulted for assistance with chronic disease management and care coordination needs.    I reached out to [name] by phone today.   Review of patient status, including review of consultants reports, relevant laboratory and other test results, and collaboration with appropriate care team members and the patient's provider was performed as part of comprehensive patient evaluation and provision of chronic care management services.     Outpatient Encounter Medications as of 11/06/2019  Medication Sig  . BYDUREON BCISE 2 MG/0.85ML AUIJ INJECT 2 MG SUBCUTANEOUSLY ONCE A WEEK  . ACCU-CHEK SMARTVIEW test strip USE 1 STRIP TO CHECK GLUCOSE TWICE DAILY  . ACCU-CHEK SMARTVIEW test strip Check sugar 2 times daily  . acetaminophen (TYLENOL) 500 MG tablet Take 1,000 mg by mouth every 8 (eight) hours as needed.  . allopurinol (ZYLOPRIM) 100 MG tablet TAKE 1 TABLET AT BEDTIME  . amLODipine (NORVASC) 2.5 MG tablet TAKE 1 TABLET AT BEDTIME  . calcium carbonate (TUMS - DOSED IN MG ELEMENTAL CALCIUM) 500 MG chewable tablet Chew 2 tablets by mouth at bedtime as needed for indigestion or heartburn.   . colchicine 0.6 MG tablet Take 1 tablet (0.6 mg total) by mouth as needed (gout flare). (Patient not taking: Reported on 06/27/2019)  . docusate sodium (COLACE) 100 MG capsule Take 100 mg by mouth 2 (two) times daily.  . fluticasone (FLONASE) 50 MCG/ACT nasal spray USE 2 SPRAYS IN EACH NOSTRIL ONCE DAILY USE FOR 4 TO 6 WEEKS, THEN STOP AND USE SEASONALLY OR AS NEEDED.  . Insulin Glargine ([name] KWIKPEN) 100 UNIT/ML Inject 19 Units into the skin at bedtime.   . Insulin Pen Needle (NOVOFINE PLUS) 32G X 4 MM MISC Use with Lantus insulin daily injection as instructed  . lisinopril (ZESTRIL) 40 MG tablet TAKE 1 TABLET EVERY DAY  . metFORMIN (GLUCOPHAGE-XR) 750 MG 24 hr tablet TAKE 2 TABLETS EVERY DAY WITH BREAKFAST  . omeprazole (PRILOSEC) 20 MG capsule TAKE 1 CAPSULE EVERY DAY   No facility-administered encounter medications on file as of 11/06/2019.    Goals Addressed              This Visit's Progress   .  PharmD - Diabetes Management (pt-stated)        CARE PLAN ENTRY (see longitudinal plan of care for additional care plan information)  Current Barriers:  . Chronic Disease Management support, education, and care coordination needs related to type 2 diabetes mellitus, hypertension, hyperlipidemia, GERD, gout, seasonal allergies . Financial Barriers - patient has [employer] insurance and reports copay for Bydureon & insulin glargine is cost prohibitive at this time  Pharmacist Clinical Goal(s):  . Over the next 30 days, patient will work with CM Pharmacist and PCP to address needs related to medication assistance and diabetes management  Interventions: . Discuss importance of blood sugar monitoring and control.  o Reports taking: - Bydureon 2 mg once weekly - [name] (insulin glargine) 19 units daily - Metformin ER 750 mg BID o Review recent CBGs:  Fasting Blood Glucose After Snack After Supper Notes  4 - August 159  234* *Hot fudge cake  5 -   August -  190   6 - August 126  229   7 - August - - -   8 - August - - 172   9 - August - - -   10 - August 147 - 206* *Large supper  11 - August 187* 174     - Discuss correlation between carbohydrate portion sizes and elevated readings. - Reports occasionally misses checking morning CBGs as he is getting up early to start work o Counsel on reviewing nutrition labels for carbohydrate content o Encourage patient to continue to check blood sugar, keep log and bring record to next PCP  appointment . Discuss impact of exercise on blood sugar. Note patient active most days through work o Discuss importance of staying hydrated . Confirms has recently obtained refills of [name] and Bydureon through assistance programs . Counsel on importance of blood pressure control and monitoring o Denies recently checking home BP o Encourage patient to restart checking home BP, keep log and bring record with him to next PCP appointment o Discuss impact of salt/sodium on blood pressure . Coordination of care: Encourage patient to call to schedule visit with PCP. Note patient due for office visit/A1C . Discuss continued importance of COVID-19 prevention o Patient received Pfizer COVID-19 vaccination - provides dates: 1st dose on 05/15/19; 2nd dose on 06/05/19 (update record in chart)  Patient Self Care Activities:  . Self administers medications as prescribed with assistance from his wife o Uses weekly pillbox as adherence tool . Attends all scheduled provider appointments . Calls pharmacy for medication refills . Calls provider office for new concerns or questions . To check blood sugars twice daily and keep log   Please see past updates related to this goal by clicking on the "Past Updates" button in the selected goal        Plan  Telephone follow up appointment with care management team member scheduled for: 10/11 at 4 pm  Sianna Garofano, PharmD, BCACP Clinical Pharmacist [facility] [phone] 

## 2019-11-08 ENCOUNTER — Telehealth: Payer: Self-pay

## 2019-11-08 DIAGNOSIS — E1169 Type 2 diabetes mellitus with other specified complication: Secondary | ICD-10-CM

## 2019-11-08 DIAGNOSIS — E1121 Type 2 diabetes mellitus with diabetic nephropathy: Secondary | ICD-10-CM

## 2019-11-08 DIAGNOSIS — N1831 Chronic kidney disease, stage 3a: Secondary | ICD-10-CM

## 2019-11-08 DIAGNOSIS — E785 Hyperlipidemia, unspecified: Secondary | ICD-10-CM

## 2019-11-08 DIAGNOSIS — I10 Essential (primary) hypertension: Secondary | ICD-10-CM

## 2019-11-08 DIAGNOSIS — M1000 Idiopathic gout, unspecified site: Secondary | ICD-10-CM

## 2019-11-08 DIAGNOSIS — R972 Elevated prostate specific antigen [PSA]: Secondary | ICD-10-CM

## 2019-11-08 NOTE — Telephone Encounter (Signed)
Addition labs ordered for [facility]  A1c CMET CBC Lipid PSA Uric Acid  Please notify patient he can go to [facility] whenever in AM for fasting lab draw next week, and we will see him in office at his appointment.  Saralyn Pilar, DO [facility] 11/08/2019, 5:38 PM

## 2019-11-08 NOTE — Telephone Encounter (Signed)
Unable to reach the patient so left message to schedule appointment but able to reach the patient today --As per patient he was talking to [place] who suggested to get lab work for A1c, inform patient that we sill send message to Dr Kirtland Bouchard if he wants additional lab work will send it to [facility] otherwise prick his finger at his appointment on 11/18/2019.

## 2019-11-08 NOTE — Telephone Encounter (Signed)
Copied from CRM [id number]. Topic: General - Other >> Nov 07, 2019 10:20 AM Dalphine Handing A wrote: Patients spouse would like a callback from nurse I nregards to having an order placed for lab work for patient at [facility] on [address] in [place]. Please Francee Piccolo

## 2019-11-11 NOTE — Telephone Encounter (Signed)
Patient's spouse advised.  

## 2019-11-12 DIAGNOSIS — E1121 Type 2 diabetes mellitus with diabetic nephropathy: Secondary | ICD-10-CM | POA: Diagnosis not present

## 2019-11-12 DIAGNOSIS — I10 Essential (primary) hypertension: Secondary | ICD-10-CM | POA: Diagnosis not present

## 2019-11-12 DIAGNOSIS — E785 Hyperlipidemia, unspecified: Secondary | ICD-10-CM | POA: Diagnosis not present

## 2019-11-12 DIAGNOSIS — N1831 Chronic kidney disease, stage 3a: Secondary | ICD-10-CM | POA: Diagnosis not present

## 2019-11-12 DIAGNOSIS — R972 Elevated prostate specific antigen [PSA]: Secondary | ICD-10-CM | POA: Diagnosis not present

## 2019-11-12 DIAGNOSIS — E1169 Type 2 diabetes mellitus with other specified complication: Secondary | ICD-10-CM | POA: Diagnosis not present

## 2019-11-12 DIAGNOSIS — M1000 Idiopathic gout, unspecified site: Secondary | ICD-10-CM | POA: Diagnosis not present

## 2019-11-12 DIAGNOSIS — Z794 Long term (current) use of insulin: Secondary | ICD-10-CM | POA: Diagnosis not present

## 2019-11-14 LAB — COMPREHENSIVE METABOLIC PANEL
ALT: 16 IU/L (ref 0–44)
AST: 18 IU/L (ref 0–40)
Albumin/Globulin Ratio: 1.4 (ref 1.2–2.2)
Albumin: 4.1 g/dL (ref 3.8–4.8)
Alkaline Phosphatase: 77 IU/L (ref 48–121)
BUN/Creatinine Ratio: 15 (ref 10–24)
BUN: 27 mg/dL (ref 8–27)
Bilirubin Total: 0.3 mg/dL (ref 0.0–1.2)
CO2: 23 mmol/L (ref 20–29)
Calcium: 9.4 mg/dL (ref 8.6–10.2)
Chloride: 100 mmol/L (ref 96–106)
Creatinine, Ser: 1.79 mg/dL — ABNORMAL HIGH (ref 0.76–1.27)
GFR calc Af Amer: 44 mL/min/{1.73_m2} — ABNORMAL LOW (ref >59)
GFR calc non Af Amer: 38 mL/min/{1.73_m2} — ABNORMAL LOW (ref >59)
Globulin, Total: 3 g/dL (ref 1.5–4.5)
Glucose: 263 mg/dL — ABNORMAL HIGH (ref 65–99)
Potassium: 4.9 mmol/L (ref 3.5–5.2)
Sodium: 137 mmol/L (ref 134–144)
Total Protein: 7.1 g/dL (ref 6.0–8.5)

## 2019-11-14 LAB — LIPID PANEL
Chol/HDL Ratio: 6.6 ratio — ABNORMAL HIGH (ref 0.0–5.0)
Cholesterol, Total: 212 mg/dL — ABNORMAL HIGH (ref 100–199)
HDL: 32 mg/dL — ABNORMAL LOW (ref >39)
LDL Chol Calc (NIH): 139 mg/dL — ABNORMAL HIGH (ref 0–99)
Triglycerides: 228 mg/dL — ABNORMAL HIGH (ref 0–149)
VLDL Cholesterol Cal: 41 mg/dL — ABNORMAL HIGH (ref 5–40)

## 2019-11-14 LAB — CBC WITH DIFFERENTIAL/PLATELET
Basophils Absolute: 0.1 10*3/uL (ref 0.0–0.2)
Basos: 1 %
EOS (ABSOLUTE): 0.2 10*3/uL (ref 0.0–0.4)
Eos: 3 %
Hematocrit: 42.4 % (ref 37.5–51.0)
Hemoglobin: 13.4 g/dL (ref 13.0–17.7)
Immature Grans (Abs): 0 10*3/uL (ref 0.0–0.1)
Immature Granulocytes: 0 %
Lymphocytes Absolute: 1.5 10*3/uL (ref 0.7–3.1)
Lymphs: 25 %
MCH: 28.9 pg (ref 26.6–33.0)
MCHC: 31.6 g/dL (ref 31.5–35.7)
MCV: 91 fL (ref 79–97)
Monocytes Absolute: 0.4 10*3/uL (ref 0.1–0.9)
Monocytes: 7 %
Neutrophils Absolute: 3.8 10*3/uL (ref 1.4–7.0)
Neutrophils: 64 %
Platelets: 271 10*3/uL (ref 150–450)
RBC: 4.64 x10E6/uL (ref 4.14–5.80)
RDW: 13.8 % (ref 11.6–15.4)
WBC: 5.9 10*3/uL (ref 3.4–10.8)

## 2019-11-14 LAB — URIC ACID: Uric Acid: 6.6 mg/dL (ref 3.8–8.4)

## 2019-11-14 LAB — HEMOGLOBIN A1C
Est. average glucose Bld gHb Est-mCnc: 209 mg/dL
Hgb A1c MFr Bld: 8.9 % — ABNORMAL HIGH (ref 4.8–5.6)

## 2019-11-14 LAB — PSA: Prostate Specific Ag, Serum: 7 ng/mL — ABNORMAL HIGH (ref 0.0–4.0)

## 2019-11-18 ENCOUNTER — Encounter: Payer: Self-pay | Admitting: Family Medicine

## 2019-11-18 ENCOUNTER — Other Ambulatory Visit: Payer: Self-pay

## 2019-11-18 ENCOUNTER — Ambulatory Visit (INDEPENDENT_AMBULATORY_CARE_PROVIDER_SITE_OTHER): Payer: Medicare HMO | Admitting: Family Medicine

## 2019-11-18 VITALS — BP 108/56 | HR 73 | Temp 97.1°F | Resp 16 | Ht 71.0 in | Wt 230.0 lb

## 2019-11-18 DIAGNOSIS — R972 Elevated prostate specific antigen [PSA]: Secondary | ICD-10-CM

## 2019-11-18 DIAGNOSIS — E1121 Type 2 diabetes mellitus with diabetic nephropathy: Secondary | ICD-10-CM | POA: Diagnosis not present

## 2019-11-18 DIAGNOSIS — Z794 Long term (current) use of insulin: Secondary | ICD-10-CM

## 2019-11-18 DIAGNOSIS — I129 Hypertensive chronic kidney disease with stage 1 through stage 4 chronic kidney disease, or unspecified chronic kidney disease: Secondary | ICD-10-CM

## 2019-11-18 DIAGNOSIS — Z0000 Encounter for general adult medical examination without abnormal findings: Secondary | ICD-10-CM

## 2019-11-18 DIAGNOSIS — E1169 Type 2 diabetes mellitus with other specified complication: Secondary | ICD-10-CM | POA: Diagnosis not present

## 2019-11-18 DIAGNOSIS — G720 Drug-induced myopathy: Secondary | ICD-10-CM | POA: Diagnosis not present

## 2019-11-18 DIAGNOSIS — E785 Hyperlipidemia, unspecified: Secondary | ICD-10-CM

## 2019-11-18 DIAGNOSIS — E66811 Obesity, class 1: Secondary | ICD-10-CM

## 2019-11-18 DIAGNOSIS — N1831 Chronic kidney disease, stage 3a: Secondary | ICD-10-CM | POA: Diagnosis not present

## 2019-11-18 DIAGNOSIS — E669 Obesity, unspecified: Secondary | ICD-10-CM

## 2019-11-18 DIAGNOSIS — N183 Chronic kidney disease, stage 3 unspecified: Secondary | ICD-10-CM

## 2019-11-18 NOTE — Assessment & Plan Note (Signed)
Elevated PSA to 7 Prior 3-5 range Will refer to Urology [facility]

## 2019-11-18 NOTE — Assessment & Plan Note (Signed)
Weight gain, BMI at 32 Encourage lifestyle diet exercise regimen now

## 2019-11-18 NOTE — Assessment & Plan Note (Signed)
Well-controlled HTN Complication with CKD-III    Plan:  1. Continue current BP regimen - Amlodipine 2.5mg  daily, Lisinopril 40mg  daily 2. Encourage improved lifestyle - low sodium diet, regular exercise improve as tolerated 3. Continue monitor BP outside office, bring readings to next visit, if persistently >140/90 or new symptoms notify office sooner

## 2019-11-18 NOTE — Assessment & Plan Note (Signed)
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 CONTINUE Bydureon BCise 2mg  weekly inj - Continue Metformin XR 750 x 2 daily - Continue Lantus 19u - he may increase dose if fasting sugar >150 on avg 2. Encourage improved lifestyle - low carb, low sugar diet, reduce portion size, continue improving regular exercise 3. Check CBG, bring log to next visit for review 4. Continue ACEi DM Foot exam today DUE DM [facility] he can re schedule

## 2019-11-18 NOTE — Assessment & Plan Note (Signed)
Stable CKD-III Secondary to age, Diabetes with hyperglycemia, HTN, possibly NSAID use  Plan: 1. Improve hydration, limit NSAIDs orally 2. Monitor Cr trend 3. Control DM see A&P 

## 2019-11-18 NOTE — Progress Notes (Signed)
Subjective:    Patient ID: Narek Kniss, male    DOB: Dec 18, 1950, 69 y.o.   MRN: [id number]  Wrigley Plasencia is a 69 y.o. male presenting on 11/18/2019 for Annual Exam   HPI   Here for Annual Exam and Physical  CHRONIC DM, Type 2with nephropathy / CKD-III He is followed by Chronic Care Management team, reviewed recent updates and sugar readings from their telephone calls. He admits increased appetite eating more still CBGs: Avgapprox 150-180 fasting High< 250. Checks CBGs1-2 daily Meds: -CONTINUE Bydureon BCise 2mg  weekly inj - Continue Metformin XR 750 x 2 daily - Continue Lantusback at19u daily Reports good compliance. Tolerating well w/o side-effects Currently on ACEi Lifestyle: - Diet (improved DM Diet, except now still increased sweets) - Exercise limited Due for DM Eye exam. Denies hypoglycemia, polyuria, visual changes, numbness or tingling.  CHRONIC HTN CKD III Current Meds - Lisinopril 40mg  daily, Amlodipine 2.5mg  daily   Reports good compliance, took meds today. Tolerating well, w/o complaints. Denies CP, dyspnea, HA, edema, dizziness / lightheadedness   Abdominal Bloating Additional complaint. Says fairly constant bloating feeling. No abdominal pain. Has regular BMs.  HYPERLIPIDEMIA / Drug Induced Myopathy Past history of statin myalgia on rosuvastatin, crestor, and lipitor in past - Reports no concerns. Last lipid panel 10/2019, elevated  Health Maintenance:  Due for Flu Shot, declines today despite counseling on benefits Due for PNA vaccine series age 28+ declines   PSA Prostate Cancer Screening - up to PSA 7.0 (10/2019), prior results 4.5 > 3.89 > 5.4  He is asymptomatic, no new concerns or urinary symptoms. Has not seen Urologist. Had recurrent UTI in past.  Depression screen [facility] 2/9 11/18/2019 08/28/2018 04/23/2018  Decreased Interest 0 0 0  Down, Depressed, Hopeless 0 0 0  PHQ - 2 Score 0 0 0    Past Medical History:  Diagnosis Date    . Allergy   . Chronic headaches   . GERD (gastroesophageal reflux disease)   . Heart burn   . Hyperlipidemia   . Hypertension    Past Surgical History:  Procedure Laterality Date  . KNEE SURGERY    . SHOULDER SURGERY    . TONSILLECTOMY     Social History   Socioeconomic History  . Marital status: Married    Spouse name: Not on file  . Number of children: Not on file  . Years of education: Not on file  . Highest education level: Not on file  Occupational History  . Occupation: retired  Tobacco Use  . Smoking status: Former Smoker    Types: Cigars    Quit date: 2000    Years since quitting: 21.6  . Smokeless tobacco: Current User    Types: Chew  . Tobacco comment: wife states patient never smoked cigarettes, just chewing tobacco  Vaping Use  . Vaping Use: Never used  Substance and Sexual Activity  . Alcohol use: Not Currently    Alcohol/week: 0.0 standard drinks  . Drug use: No  . Sexual activity: Yes    Birth control/protection: Inserts  Other Topics Concern  . Not on file  Social History Narrative  . Not on file   Social Determinants of Health   Financial Resource Strain:   . Difficulty of Paying Living Expenses: Not on file  Food Insecurity:   . Worried About 10/28/2018 in the Last Year: Not on file  . Ran Out of Food in the Last Year: Not on file  Transportation  Needs:   . Lack of Transportation (Medical): Not on file  . Lack of Transportation (Non-Medical): Not on file  Physical Activity: Unknown  . Days of Exercise per Week: 7 days  . Minutes of Exercise per Session: Not asked  Stress:   . Feeling of Stress : Not on file  Social Connections:   . Frequency of Communication with Friends and Family: Not on file  . Frequency of Social Gatherings with Friends and Family: Not on file  . Attends Religious Services: Not on file  . Active Member of Clubs or Organizations: Not on file  . Attends Banker Meetings: Not on file  .  Marital Status: Not on file  Intimate Partner Violence:   . Fear of Current or Ex-Partner: Not on file  . Emotionally Abused: Not on file  . Physically Abused: Not on file  . Sexually Abused: Not on file   Family History  Problem Relation Age of Onset  . Cancer Father        lung  . Heart disease Sister   . Heart disease Brother   . Prostate cancer Neg Hx    Current Outpatient Medications on File Prior to Visit  Medication Sig  . ACCU-CHEK SMARTVIEW test strip USE 1 STRIP TO CHECK GLUCOSE TWICE DAILY  . ACCU-CHEK SMARTVIEW test strip Check sugar 2 times daily  . acetaminophen (TYLENOL) 500 MG tablet Take 1,000 mg by mouth every 8 (eight) hours as needed.  Marland Kitchen allopurinol (ZYLOPRIM) 100 MG tablet TAKE 1 TABLET AT BEDTIME  . amLODipine (NORVASC) 2.5 MG tablet TAKE 1 TABLET AT BEDTIME  . BYDUREON BCISE 2 MG/0.85ML AUIJ INJECT 2 MG SUBCUTANEOUSLY ONCE A WEEK  . calcium carbonate (TUMS - DOSED IN MG ELEMENTAL CALCIUM) 500 MG chewable tablet Chew 2 tablets by mouth at bedtime as needed for indigestion or heartburn.   . colchicine 0.6 MG tablet Take 1 tablet (0.6 mg total) by mouth as needed (gout flare).  Marland Kitchen docusate sodium (COLACE) 100 MG capsule Take 100 mg by mouth 2 (two) times daily.  . fluticasone (FLONASE) 50 MCG/ACT nasal spray USE 2 SPRAYS IN EACH NOSTRIL ONCE DAILY USE FOR 4 TO 6 WEEKS, THEN STOP AND USE SEASONALLY OR AS NEEDED.  Marland Kitchen Insulin Glargine ([name] KWIKPEN) 100 UNIT/ML Inject 19 Units into the skin at bedtime.  . Insulin Pen Needle (NOVOFINE PLUS) 32G X 4 MM MISC Use with Lantus insulin daily injection as instructed  . lisinopril (ZESTRIL) 40 MG tablet TAKE 1 TABLET EVERY DAY  . metFORMIN (GLUCOPHAGE-XR) 750 MG 24 hr tablet TAKE 2 TABLETS EVERY DAY WITH BREAKFAST  . omeprazole (PRILOSEC) 20 MG capsule TAKE 1 CAPSULE EVERY DAY   No current facility-administered medications on file prior to visit.    Review of Systems  Constitutional: Negative for activity change,  appetite change, chills, diaphoresis, fatigue and fever.  HENT: Negative for congestion and hearing loss.   Eyes: Negative for visual disturbance.  Respiratory: Negative for apnea, cough, chest tightness, shortness of breath and wheezing.   Cardiovascular: Negative for chest pain, palpitations and leg swelling.  Gastrointestinal: Negative for abdominal pain, anal bleeding, blood in stool, constipation, diarrhea, nausea and vomiting.  Endocrine: Negative for cold intolerance.  Genitourinary: Negative for difficulty urinating, dysuria, frequency and hematuria.  Musculoskeletal: Negative for arthralgias, back pain and neck pain.  Skin: Negative for rash.  Allergic/Immunologic: Negative for environmental allergies.  Neurological: Negative for dizziness, weakness, light-headedness, numbness and headaches.  Hematological: Negative for adenopathy.  Psychiatric/Behavioral: Negative for behavioral problems, dysphoric mood and sleep disturbance. The patient is not nervous/anxious.    Per HPI unless specifically indicated above     Objective:    BP (!) 108/56   Pulse 73   Temp (!) 97.1 F (36.2 C) (Temporal)   Resp 16   Ht 5\' 11"  (1.803 m)   Wt 230 lb (104.3 kg)   SpO2 99%   BMI 32.08 kg/m   Wt Readings from Last 3 Encounters:  11/18/19 230 lb (104.3 kg)  02/07/19 228 lb (103.4 kg)  02/05/19 219 lb (99.3 kg)    Physical Exam Vitals and nursing note reviewed.  Constitutional:      General: He is not in acute distress.    Appearance: He is well-developed. He is not diaphoretic.     Comments: Well-appearing, comfortable, cooperative  HENT:     Head: Normocephalic and atraumatic.  Eyes:     General:        Right eye: No discharge.        Left eye: No discharge.     Conjunctiva/sclera: Conjunctivae normal.     Pupils: Pupils are equal, round, and reactive to light.  Neck:     Thyroid: No thyromegaly.  Cardiovascular:     Rate and Rhythm: Normal rate and regular rhythm.     Heart  sounds: Normal heart sounds. No murmur heard.   Pulmonary:     Effort: Pulmonary effort is normal. No respiratory distress.     Breath sounds: Normal breath sounds. No wheezing or rales.  Abdominal:     General: Bowel sounds are normal. There is no distension.     Palpations: Abdomen is soft. There is no mass.     Tenderness: There is no abdominal tenderness.  Musculoskeletal:        General: No tenderness. Normal range of motion.     Cervical back: Normal range of motion and neck supple.     Comments: Upper / Lower Extremities: - Normal muscle tone, strength bilateral upper extremities 5/5, lower extremities 5/5  Lymphadenopathy:     Cervical: No cervical adenopathy.  Skin:    General: Skin is warm and dry.     Findings: No erythema or rash.  Neurological:     Mental Status: He is alert and oriented to person, place, and time.     Comments: Distal sensation intact to light touch all extremities  Psychiatric:        Behavior: Behavior normal.     Comments: Well groomed, good eye contact, normal speech and thoughts     Recent Labs    11/29/18 0943 01/11/19 0000 11/12/19 0829  HGBA1C 7.4* 7.8 8.9*     Diabetic Foot Exam - Simple   Simple Foot Form Diabetic Foot exam was performed with the following findings: Yes 11/18/2019  9:02 AM  Visual Inspection No deformities, no ulcerations, no other skin breakdown bilaterally: Yes Sensation Testing Intact to touch and monofilament testing bilaterally: Yes Pulse Check Posterior Tibialis and Dorsalis pulse intact bilaterally: Yes Comments     Results for orders placed or performed in visit on 11/08/19  Hemoglobin A1c  Result Value Ref Range   Hgb A1c MFr Bld 8.9 (H) 4.8 - 5.6 %   Est. average glucose Bld gHb Est-mCnc 209 mg/dL  CBC with Differential/Platelet  Result Value Ref Range   WBC 5.9 3.4 - 10.8 x10E3/uL   RBC 4.64 4.14 - 5.80 x10E6/uL   Hemoglobin 13.4 13.0 - 17.7 g/dL  Hematocrit 42.4 37.5 - 51.0 %   MCV 91 79  - 97 fL   MCH 28.9 26.6 - 33.0 pg   MCHC 31.6 31 - 35 g/dL   RDW 62.1 30.8 - 65.7 %   Platelets 271 150 - 450 x10E3/uL   Neutrophils 64 Not Estab. %   Lymphs 25 Not Estab. %   Monocytes 7 Not Estab. %   Eos 3 Not Estab. %   Basos 1 Not Estab. %   Neutrophils Absolute 3.8 1 - 7 x10E3/uL   Lymphocytes Absolute 1.5 0 - 3 x10E3/uL   Monocytes Absolute 0.4 0 - 0 x10E3/uL   EOS (ABSOLUTE) 0.2 0.0 - 0.4 x10E3/uL   Basophils Absolute 0.1 0 - 0 x10E3/uL   Immature Granulocytes 0 Not Estab. %   Immature Grans (Abs) 0.0 0.0 - 0.1 x10E3/uL  Lipid panel  Result Value Ref Range   Cholesterol, Total 212 (H) 100 - 199 mg/dL   Triglycerides 846 (H) 0 - 149 mg/dL   HDL 32 (L) >96 mg/dL   VLDL Cholesterol Cal 41 (H) 5 - 40 mg/dL   LDL Chol Calc (NIH) 295 (H) 0 - 99 mg/dL   Chol/HDL Ratio 6.6 (H) 0.0 - 5.0 ratio  PSA  Result Value Ref Range   Prostate Specific Ag, Serum 7.0 (H) 0.0 - 4.0 ng/mL  Comprehensive metabolic panel  Result Value Ref Range   Glucose 263 (H) 65 - 99 mg/dL   BUN 27 8 - 27 mg/dL   Creatinine, Ser 2.84 (H) 0.76 - 1.27 mg/dL   GFR calc non Af Amer 38 (L) >59 mL/min/1.73   GFR calc Af Amer 44 (L) >59 mL/min/1.73   BUN/Creatinine Ratio 15 10 - 24   Sodium 137 134 - 144 mmol/L   Potassium 4.9 3.5 - 5.2 mmol/L   Chloride 100 96 - 106 mmol/L   CO2 23 20 - 29 mmol/L   Calcium 9.4 8.6 - 10.2 mg/dL   Total Protein 7.1 6.0 - 8.5 g/dL   Albumin 4.1 3.8 - 4.8 g/dL   Globulin, Total 3.0 1.5 - 4.5 g/dL   Albumin/Globulin Ratio 1.4 1.2 - 2.2   Bilirubin Total 0.3 0.0 - 1.2 mg/dL   Alkaline Phosphatase 77 48 - 121 IU/L   AST 18 0 - 40 IU/L   ALT 16 0 - 44 IU/L  Uric acid  Result Value Ref Range   Uric Acid 6.6 3.8 - 8.4 mg/dL      Assessment & Plan:   Problem List Items Addressed This Visit    Type 2 diabetes mellitus with diabetic nephropathy (HCC)    A1c elevated today 8.9 No hypoglycemia Complications - CKD-III nephropathy, other neuropathy from spine but less likely  DM neuropathy, other including hyperlipidemia, GERD, obesity - increases risk of future cardiovascular complications  Off Tradjenta, duplicate therapy/cost, Invokana Failed Trulicity 0.75 and 1.5mg  weekly d/t GI intolerance  Plan:  1. CONTINUE Bydureon BCise 2mg  weekly inj - Continue Metformin XR 750 x 2 daily - Continue Lantus 19u - he may increase dose if fasting sugar >150 on avg 2. Encourage improved lifestyle - low carb, low sugar diet, reduce portion size, continue improving regular exercise 3. Check CBG, bring log to next visit for review 4. Continue ACEi DM Foot exam today DUE DM Eye [facility] he can re schedule      Obesity (BMI 30.0-34.9)    Weight gain, BMI at 32 Encourage lifestyle diet exercise regimen now  Hyperlipidemia associated with type 2 diabetes mellitus (HCC)    Uncontrolled cholesterol, not on statin due to myalgia drug induced myopathy Last lipid panel 10/2019 The 10-year ASCVD risk score Denman George DC Jr., et al., 2013) is: 31.3%  Plan: 1. Declines statin therapy, again not candidate due to drug induced myopathy - offer other option Zetia, declines today will reconsider. 2. Encourage improved lifestyle - low carb/cholesterol, reduce portion size, continue improving regular exercise      Elevated PSA, less than 10 ng/ml    Elevated PSA to 7 Prior 3-5 range Will refer to Urology [facility]      Relevant Orders   Ambulatory referral to Urology   Drug-induced myopathy    Secondary to side effect on statin (failed rosuvastatin, crestor, lipitor in past)      CKD (chronic kidney disease), stage III    Stable CKD-III Secondary to age, Diabetes with hyperglycemia, HTN, possibly NSAID use  Plan: 1. Improve hydration, limit NSAIDs orally 2. Monitor Cr trend 3. Control DM see A&P      Benign hypertension with CKD (chronic kidney disease) stage III    Well-controlled HTN Complication with CKD-III    Plan:  1. Continue current BP regimen - Amlodipine  2.5mg  daily, Lisinopril  daily 2. Encourage improved lifestyle - low sodium diet, regular exercise improve as tolerated 3. Continue monitor BP outside office, bring readings to next visit, if persistently >140/90 or new symptoms notify office sooner       Other Visit Diagnoses    Annual physical exam    -  Primary      Updated Health Maintenance information Reviewed recent lab results with patient Encouraged improvement to lifestyle with diet and exercise - Goal of weight loss  Orders Placed This Encounter  Procedures  . Ambulatory referral to Urology    Referral Priority:   Routine    Referral Type:   Consultation    Referral Reason:   Specialty Services Required    Requested Specialty:   Urology    Number of Visits Requested:   1      No orders of the defined types were placed in this encounter.     Follow up plan: Return in about 3 months (around 02/18/2020) for 3 month DM A1c.  Saralyn Pilar, DO [facility] 11/18/2019, 8:46 AM

## 2019-11-18 NOTE — Patient Instructions (Addendum)
Thank you for coming to the office today.  Recent Labs    11/29/18 0943 01/11/19 0000 11/12/19 0829  HGBA1C 7.4* 7.8 8.9*    Elevated PSA 7 (prostate) we will refer to Urologist.  [facility] -1st floor [address] [place],  [place]  [address] Phone: [phone]  ------------ Call [facility] and check if you have been seen this year if not then go ahead and schedule for Diabetic Eye Exam, send report to [place].  [facility] [address], [place], [place] [address] Phone: [phone] [email or web address]  -----------------------  May try the Gas-X OTC for bloating.  For Constipation (less frequent bowel movement that can be hard dry or involve straining).  Recommend trying OTC Miralax 17g = 1 capful in large glass water once daily for now, try several days to see if working, goal is soft stool or BM 1-2 times daily, if too loose then reduce dose or try every other day. If not effective may need to increase it to 2 doses at once in AM or may do 1 in morning and 1 in afternoon/evening  - This medicine is very safe and can be used often without any problem and will not make you dehydrated. It is good for use on AS NEEDED BASIS or even MAINTENANCE therapy for longer term for several days to weeks at a time to help regulate bowel movements  Other more natural remedies or preventative treatment: - Increase hydration with water - Increase fiber in diet (high fiber foods = vegetables, leafy greens, oats/grains) - May take OTC Fiber supplement (metamucil powder or pill/gummy) - May try OTC Probiotic   Please schedule a Follow-up Appointment to: Return in about 3 months (around 02/18/2020) for 3 month DM A1c.  If you have any other questions or concerns, please feel free to call the office or send a message through MyChart. You may also schedule an earlier appointment if necessary.  Additionally, you may be receiving  a survey about your experience at our office within a few days to 1 week by e-mail or mail. We value your feedback.  Saralyn Pilar, DO [facility], [place]

## 2019-11-18 NOTE — Assessment & Plan Note (Signed)
Uncontrolled cholesterol, not on statin due to myalgia drug induced myopathy Last lipid panel 10/2019 The 10-year ASCVD risk score Denman George DC Jr., et al., 2013) is: 31.3%  Plan: 1. Declines statin therapy, again not candidate due to drug induced myopathy - offer other option Zetia, declines today will reconsider. 2. Encourage improved lifestyle - low carb/cholesterol, reduce portion size, continue improving regular exercise

## 2019-11-18 NOTE — Assessment & Plan Note (Signed)
Secondary to side effect on statin (failed rosuvastatin, crestor, lipitor in past)

## 2019-11-22 ENCOUNTER — Ambulatory Visit (INDEPENDENT_AMBULATORY_CARE_PROVIDER_SITE_OTHER): Payer: Medicare HMO | Admitting: Urology

## 2019-11-22 ENCOUNTER — Encounter: Payer: Self-pay | Admitting: Urology

## 2019-11-22 ENCOUNTER — Other Ambulatory Visit: Payer: Self-pay

## 2019-11-22 VITALS — BP 150/78 | HR 82 | Ht 71.0 in | Wt 226.0 lb

## 2019-11-22 DIAGNOSIS — R972 Elevated prostate specific antigen [PSA]: Secondary | ICD-10-CM | POA: Diagnosis not present

## 2019-11-22 LAB — URINALYSIS, COMPLETE
Bilirubin, UA: NEGATIVE
Ketones, UA: NEGATIVE
Leukocytes,UA: NEGATIVE
Nitrite, UA: NEGATIVE
Protein,UA: NEGATIVE
RBC, UA: NEGATIVE
Specific Gravity, UA: 1.025 (ref 1.005–1.030)
Urobilinogen, Ur: 0.2 mg/dL (ref 0.2–1.0)
pH, UA: 5.5 (ref 5.0–7.5)

## 2019-11-22 LAB — MICROSCOPIC EXAMINATION: Bacteria, UA: NONE SEEN

## 2019-11-22 NOTE — Progress Notes (Signed)
11/22/2019 8:51 AM   Alphonzo Grieve Kalt [birth year] [id number]  Referring provider: [name], DO [address] [place],  [place] [address]  Chief Complaint  Patient presents with  . Elevated PSA    HPI: [name] is a 69 y.o. male seen at the request of Dr. Althea Charon for evaluation of an elevated PSA.   PSA 11/15/2019 7.0  No bothersome LUTS  Denies dysuria, gross hematuria  Denies flank, abdominal or pelvic pain  History of recurrent UTI; urology eval approximately 12 years ago diagnosed with "lingering UTI"  Episode of dysuria April 2021, dipstick UA negative  Urine culture grew E. Coli  No family history prostate cancer      PMH: Past Medical History:  Diagnosis Date  . Allergy   . Chronic headaches   . GERD (gastroesophageal reflux disease)   . Heart burn   . Hyperlipidemia   . Hypertension     Surgical History: Past Surgical History:  Procedure Laterality Date  . KNEE SURGERY    . SHOULDER SURGERY    . TONSILLECTOMY      Home Medications:  Allergies as of 11/22/2019      Reactions   Crestor [rosuvastatin Calcium] Other (See Comments)   myalgia   Invokana [canagliflozin] Other (See Comments)   dehydrated   Prednisone Other (See Comments)      Medication List       Accurate as of November 22, 2019  8:51 AM. If you have any questions, ask your nurse or doctor.        Accu-Chek SmartView test strip Generic drug: glucose blood Check sugar 2 times daily   Accu-Chek SmartView test strip Generic drug: glucose blood USE 1 STRIP TO CHECK GLUCOSE TWICE DAILY   acetaminophen 500 MG tablet Commonly known as: TYLENOL Take 1,000 mg by mouth every 8 (eight) hours as needed.   allopurinol 100 MG tablet Commonly known as: ZYLOPRIM TAKE 1 TABLET AT BEDTIME   amLODipine 2.5 MG tablet Commonly known as: NORVASC TAKE 1 TABLET AT BEDTIME   [name] KwikPen 100 UNIT/ML Inject 19 Units into the skin at bedtime.   Bydureon BCise 2  MG/0.85ML Auij Generic drug: Exenatide ER INJECT 2 MG SUBCUTANEOUSLY ONCE A WEEK   calcium carbonate 500 MG chewable tablet Commonly known as: TUMS - dosed in mg elemental calcium Chew 2 tablets by mouth at bedtime as needed for indigestion or heartburn.   colchicine 0.6 MG tablet Take 1 tablet (0.6 mg total) by mouth as needed (gout flare).   docusate sodium 100 MG capsule Commonly known as: COLACE Take 100 mg by mouth 2 (two) times daily.   fluticasone 50 MCG/ACT nasal spray Commonly known as: FLONASE USE 2 SPRAYS IN EACH NOSTRIL ONCE DAILY USE FOR 4 TO 6 WEEKS, THEN STOP AND USE SEASONALLY OR AS NEEDED.   Insulin Pen Needle 32G X 4 MM Misc Commonly known as: NovoFine Plus Use with Lantus insulin daily injection as instructed   lisinopril 40 MG tablet Commonly known as: ZESTRIL TAKE 1 TABLET EVERY DAY   metFORMIN 750 MG 24 hr tablet Commonly known as: GLUCOPHAGE-XR TAKE 2 TABLETS EVERY DAY WITH BREAKFAST   omeprazole 20 MG capsule Commonly known as: PRILOSEC TAKE 1 CAPSULE EVERY DAY       Allergies:  Allergies  Allergen Reactions  . Crestor [Rosuvastatin Calcium] Other (See Comments)    myalgia  . Invokana [Canagliflozin] Other (See Comments)    dehydrated  . Prednisone Other (See Comments)  Family History: Family History  Problem Relation Age of Onset  . Cancer Father        lung  . Heart disease Sister   . Heart disease Brother   . Prostate cancer Neg Hx     Social History:  reports that he quit smoking about 21 years ago. His smoking use included cigars. His smokeless tobacco use includes chew. He reports previous alcohol use. He reports that he does not use drugs.   Physical Exam: BP (!) 150/78   Pulse 82   Ht 5\' 11"  (1.803 m)   Wt 226 lb (102.5 kg)   BMI 31.52 kg/m   Constitutional:  Alert and oriented, No acute distress. HEENT: [place] AT, moist mucus membranes.  Trachea midline, no masses. Cardiovascular: No clubbing, cyanosis, or  edema. Respiratory: Normal respiratory effort, no increased work of breathing. GI: Abdomen is soft, nontender, nondistended, no abdominal masses GU: Phallus uncircumcised without lesions, testes descended bilaterally without masses or tenderness; spermatic cord/epididymis palpably normal bilaterally.  Prostate 45 g, smooth without nodules Skin: No rashes, bruises or suspicious lesions. Neurologic: Grossly intact, no focal deficits, moving all 4 extremities. Psychiatric: Normal mood and affect.    Assessment & Plan:    1.  Elevated PSA  Although PSA is a prostate cancer screening test he was informed that cancer is not the most common cause of an elevated PSA. Other potential causes including BPH and inflammation were discussed. He was informed that the only way to adequately diagnose prostate cancer would be a transrectal ultrasound and biopsy of the prostate. The procedure was discussed including potential risks of bleeding and infection/sepsis. He was also informed that a negative biopsy does not conclusively rule out the possibility that prostate cancer may be present and that continued monitoring is required. The use of newer adjunctive blood tests including PHI and 4kScore were discussed. The use of multiparametric prostate MRI was also discussed however is not typically used for initial evaluation of an elevated PSA. Continued periodic surveillance was also discussed.  With history recurrent UTI UA ordered and will call with results  He will think over these options  2.  History recurrent UTI  3.  BPH without lower urinary tract symptoms   , MD  [facility] [address], [address] [place], [place] [phone]

## 2019-11-25 ENCOUNTER — Telehealth: Payer: Self-pay

## 2019-11-25 NOTE — Telephone Encounter (Signed)
Incoming call from pt's wife requesting UA results and next steps. Informed wife of negative UA. Advised wife that per provider note the pt was to decide how he would like to proceed based on options given including MRI, Prostate BX, and 4kscore. Wife states pt would like to proceed with biopsy.

## 2019-11-26 NOTE — Telephone Encounter (Signed)
Please schedule prostate biopsy

## 2019-11-27 ENCOUNTER — Other Ambulatory Visit: Payer: Self-pay | Admitting: Family Medicine

## 2019-11-27 ENCOUNTER — Encounter: Payer: Self-pay | Admitting: *Deleted

## 2019-11-27 DIAGNOSIS — J3089 Other allergic rhinitis: Secondary | ICD-10-CM

## 2019-12-03 DIAGNOSIS — Z981 Arthrodesis status: Secondary | ICD-10-CM | POA: Diagnosis not present

## 2019-12-04 ENCOUNTER — Other Ambulatory Visit: Payer: Self-pay

## 2019-12-04 ENCOUNTER — Encounter: Payer: Self-pay | Admitting: Urology

## 2019-12-04 ENCOUNTER — Other Ambulatory Visit: Payer: Self-pay | Admitting: Urology

## 2019-12-04 ENCOUNTER — Ambulatory Visit: Payer: Medicare HMO | Admitting: Urology

## 2019-12-04 VITALS — BP 152/78 | HR 77 | Ht 71.0 in | Wt 230.0 lb

## 2019-12-04 DIAGNOSIS — R972 Elevated prostate specific antigen [PSA]: Secondary | ICD-10-CM

## 2019-12-04 MED ORDER — LEVOFLOXACIN 500 MG PO TABS
500.0000 mg | ORAL_TABLET | Freq: Once | ORAL | Status: AC
  Administered 2019-12-04: 500 mg via ORAL

## 2019-12-04 MED ORDER — GENTAMICIN SULFATE 40 MG/ML IJ SOLN
80.0000 mg | Freq: Once | INTRAMUSCULAR | Status: AC
  Administered 2019-12-04: 80 mg via INTRAMUSCULAR

## 2019-12-04 NOTE — Progress Notes (Signed)
Prostate Biopsy Procedure   Informed consent was obtained after discussing risks/benefits of the procedure.  A time out was performed to ensure correct patient identity.  Pre-Procedure: - Last PSA Level: 11/12/2019-7.0 - Gentamicin given prophylactically - Levaquin 500 mg administered PO -Transrectal Ultrasound performed revealing a 38 gm prostate -No significant hypoechoic or median lobe noted -PSAD 0.18  Procedure: - Prostate block performed using 10 cc 1% lidocaine and biopsies taken from sextant areas, a total of 12 under ultrasound guidance.  Post-Procedure: - Patient tolerated the procedure well - He was counseled to seek immediate medical attention if experiences any severe pain, significant bleeding, or fevers - Return in one week to discuss biopsy results   Irineo Axon, MD

## 2019-12-05 LAB — SURGICAL PATHOLOGY

## 2019-12-06 ENCOUNTER — Telehealth: Payer: Self-pay | Admitting: Urology

## 2019-12-06 NOTE — Telephone Encounter (Signed)
Please let patient know prostate biopsy showed no cancer.  Can cancel his 10/4 follow-up appointment and reschedule 6 months for office visit with PSA/DRE.

## 2019-12-06 NOTE — Telephone Encounter (Signed)
Notified patient as instructed, patient pleased. Discussed follow-up appointments, patient agrees  

## 2019-12-10 ENCOUNTER — Other Ambulatory Visit: Payer: Self-pay

## 2019-12-10 ENCOUNTER — Ambulatory Visit (INDEPENDENT_AMBULATORY_CARE_PROVIDER_SITE_OTHER): Payer: Medicare HMO | Admitting: Physician Assistant

## 2019-12-10 ENCOUNTER — Encounter: Payer: Self-pay | Admitting: Physician Assistant

## 2019-12-10 VITALS — BP 135/78 | HR 96 | Ht 71.0 in | Wt 234.0 lb

## 2019-12-10 DIAGNOSIS — R300 Dysuria: Secondary | ICD-10-CM

## 2019-12-10 LAB — URINALYSIS, COMPLETE
Bilirubin, UA: NEGATIVE
Nitrite, UA: POSITIVE — AB
Specific Gravity, UA: 1.015 (ref 1.005–1.030)
Urobilinogen, Ur: 1 mg/dL (ref 0.2–1.0)
pH, UA: 5 (ref 5.0–7.5)

## 2019-12-10 LAB — MICROSCOPIC EXAMINATION
RBC, Urine: >30 /hpf — AB (ref 0–2)
WBC, UA: >30 /hpf — AB (ref 0–5)

## 2019-12-10 LAB — BLADDER SCAN AMB NON-IMAGING

## 2019-12-10 MED ORDER — CIPROFLOXACIN HCL 500 MG PO TABS: 500.0000 mg | ORAL_TABLET | Freq: Two times a day (BID) | ORAL | 0 refills | Status: DC

## 2019-12-10 NOTE — Patient Instructions (Signed)
Start antibiotics today. Avoid strenuous physical activity while on Cipro due to concerns for increased risk of tendon rupture on this medication.  If you do not feel better within 3 days or develop a fever greater than 101F, nausea, or vomiting, call our office immediately or proceed to the Emergency Department if outside of office hours (8a-5p Monday-Friday).

## 2019-12-10 NOTE — Progress Notes (Signed)
12/10/2019 12:49 PM   [name] Jun 27, 1950 [id number]  CC: Chief Complaint  Patient presents with  . Dysuria    HPI: [name] is a 69 y.o. male with PMH recurrent E. coli UTI and elevated PSA who underwent prostate biopsy with Dr. Lonna Cobb 6 days ago who presents today for evaluation of possible UTI.  Today he reports a 1 day history of dysuria, frequency, urinary leakage, lower abdominal pain, and chills.  He reports an episode of shaking chills overnight and a sensation of flushed face.  He states the shaking and flushed face are not typical for him with past UTIs.  He denies fever.  He has been taking Azo for symptom management.  He has had 3 positive urine cultures in the past year, all with Bactrim resistant E. coli.  In-office UA today positive for 2+ glucose, trace ketones, 3+ blood, 2+ protein, nitrites, and 3+ leukocyte esterase; urine microscopy with >30 WBCs/HPF, >30 RBCs/HPF, and moderate bacteria. PVR 49mL.  PMH: Past Medical History:  Diagnosis Date  . Allergy   . Chronic headaches   . GERD (gastroesophageal reflux disease)   . Heart burn   . Hyperlipidemia   . Hypertension     Surgical History: Past Surgical History:  Procedure Laterality Date  . KNEE SURGERY    . SHOULDER SURGERY    . TONSILLECTOMY      Home Medications:  Allergies as of 12/10/2019      Reactions   Crestor [rosuvastatin Calcium] Other (See Comments)   myalgia   Invokana [canagliflozin] Other (See Comments)   dehydrated   Prednisone Other (See Comments)      Medication List       Accurate as of December 10, 2019 12:49 PM. If you have any questions, ask your nurse or doctor.        Accu-Chek SmartView test strip Generic drug: glucose blood Check sugar 2 times daily   Accu-Chek SmartView test strip Generic drug: glucose blood USE 1 STRIP TO CHECK GLUCOSE TWICE DAILY   acetaminophen 500 MG tablet Commonly known as: TYLENOL Take 1,000 mg by mouth every 8  (eight) hours as needed.   allopurinol 100 MG tablet Commonly known as: ZYLOPRIM TAKE 1 TABLET AT BEDTIME   amLODipine 2.5 MG tablet Commonly known as: NORVASC TAKE 1 TABLET AT BEDTIME   [name] KwikPen 100 UNIT/ML Inject 19 Units into the skin at bedtime.   Bydureon BCise 2 MG/0.85ML Auij Generic drug: Exenatide ER INJECT 2 MG SUBCUTANEOUSLY ONCE A WEEK   calcium carbonate 500 MG chewable tablet Commonly known as: TUMS - dosed in mg elemental calcium Chew 2 tablets by mouth at bedtime as needed for indigestion or heartburn.   ciprofloxacin 500 MG tablet Commonly known as: Cipro Take 1 tablet (500 mg total) by mouth 2 (two) times daily for 28 days. Started by: Carman Ching, PA-C   colchicine 0.6 MG tablet Take 1 tablet (0.6 mg total) by mouth as needed (gout flare).   docusate sodium 100 MG capsule Commonly known as: COLACE Take 100 mg by mouth 2 (two) times daily.   fluticasone 50 MCG/ACT nasal spray Commonly known as: FLONASE USE 2 SPRAYS IN EACH NOSTRIL ONCE DAILY USE FOR 4 TO 6 WEEKS, THEN STOP AND USE SEASONALLY OR AS NEEDED.   Insulin Pen Needle 32G X 4 MM Misc Commonly known as: NovoFine Plus Use with Lantus insulin daily injection as instructed   lisinopril 40 MG tablet Commonly known as: ZESTRIL TAKE  1 TABLET EVERY DAY   metFORMIN 750 MG 24 hr tablet Commonly known as: GLUCOPHAGE-XR TAKE 2 TABLETS EVERY DAY WITH BREAKFAST   omeprazole 20 MG capsule Commonly known as: PRILOSEC TAKE 1 CAPSULE EVERY DAY       Allergies:  Allergies  Allergen Reactions  . Crestor [Rosuvastatin Calcium] Other (See Comments)    myalgia  . Invokana [Canagliflozin] Other (See Comments)    dehydrated  . Prednisone Other (See Comments)    Family History: Family History  Problem Relation Age of Onset  . Cancer Father        lung  . Heart disease Sister   . Heart disease Brother   . Prostate cancer Neg Hx     Social History:   reports that he quit  smoking about 21 years ago. His smoking use included cigars. His smokeless tobacco use includes chew. He reports previous alcohol use. He reports that he does not use drugs.  Physical Exam: BP 135/78 (BP Location: Left Arm, Patient Position: Sitting, Cuff Size: Normal)   Pulse 96   Ht 5\' 11"  (1.803 m)   Wt 234 lb (106.1 kg)   BMI 32.64 kg/m   Constitutional:  Alert and oriented, no acute distress, nontoxic appearing HEENT: [place], AT Cardiovascular: No clubbing, cyanosis, or edema Respiratory: Normal respiratory effort, no increased work of breathing Skin: No rashes, bruises or suspicious lesions Neurologic: Grossly intact, no focal deficits, moving all 4 extremities Psychiatric: Normal mood and affect  Laboratory Data: Results for orders placed or performed in visit on 12/10/19  Microscopic Examination   Urine  Result Value Ref Range   WBC, UA >30 (A) 0 - 5 /hpf   RBC >30 (A) 0 - 2 /hpf   Epithelial Cells (non renal) 0-10 0 - 10 /hpf   Renal Epithel, UA 0-10 (A) None seen /hpf   Bacteria, UA Moderate (A) None seen/Few  Urinalysis, Complete  Result Value Ref Range   Specific Gravity, UA 1.015 1.005 - 1.030   pH, UA 5.0 5.0 - 7.5   Color, UA Orange Yellow   Appearance Ur Cloudy (A) Clear   Leukocytes,UA 3+ (A) Negative   Protein,UA 2+ (A) Negative/Trace   Glucose, UA 2+ (A) Negative   Ketones, UA Trace (A) Negative   RBC, UA 3+ (A) Negative   Bilirubin, UA Negative Negative   Urobilinogen, Ur 1.0 0.2 - 1.0 mg/dL   Nitrite, UA Positive (A) Negative   Microscopic Examination See below:   Bladder Scan (Post Void Residual) in office  Result Value Ref Range   Scan Result 58mL    Assessment & Plan:   1. Dysuria 69 year old male with a history of Bactrim resistant E. coli UTIs now 6 days post prostate biopsy for evaluation of elevated PSA, now with a 1 day history of dysuria, frequency, lower abdominal pain, shaking chills, and flushed face.  UA grossly infected today; microscopic  hematuria expected following prostate biopsy.    Differential includes acute cystitis versus acute bacterial prostatitis.  Given reports of shaking chills and flushed face, which are not typical for him with UTI, as well as recent prostate biopsy, will treat for bacterial prostatitis with Cipro 500 mg twice daily x28 days per recent urine culture results.  VSS today, no indication for higher level of care.  Counseled patient to follow-up in clinic if he does not feel better within 72 hours or if he develops a fever greater than 101 F, nausea, or vomiting.  Additionally, counseled patient  to avoid strenuous activity while on Cipro due to concerns for increased risk of tendon rupture.  He expressed understanding. - Urinalysis, Complete - Bladder Scan (Post Void Residual) in office - CULTURE, URINE COMPREHENSIVE - ciprofloxacin (CIPRO) 500 MG tablet; Take 1 tablet (500 mg total) by mouth 2 (two) times daily for 28 days.  Dispense: 56 tablet; Refill: 0   Return if symptoms worsen or fail to improve.  Carman Ching, PA-C  [facility] [address], [address] [place], [place] [address] [phone]

## 2019-12-14 LAB — CULTURE, URINE COMPREHENSIVE

## 2019-12-16 ENCOUNTER — Telehealth: Payer: Self-pay | Admitting: Family Medicine

## 2019-12-16 MED ORDER — SULFAMETHOXAZOLE-TRIMETHOPRIM 800-160 MG PO TABS: 1.0000 | ORAL_TABLET | Freq: Two times a day (BID) | ORAL | 0 refills | Status: DC

## 2019-12-16 NOTE — Telephone Encounter (Signed)
-----   Message from Harle Battiest, PA-C sent at 12/16/2019  2:28 PM EDT ----- Please let Mr. Pann know that his urine culture result returned positive for infection, but the bacteria is resistant to the ciprofloxacin.  We need to get him started on Septra DS, BID x 28 days and he needs to stop the ciprofloxacin.

## 2019-12-16 NOTE — Telephone Encounter (Signed)
Patient notified and voiced understanding. New ABX was sent to the pharmacy.

## 2019-12-25 ENCOUNTER — Telehealth: Payer: Self-pay | Admitting: *Deleted

## 2019-12-25 MED ORDER — AMOXICILLIN-POT CLAVULANATE 875-125 MG PO TABS: 1.0000 | ORAL_TABLET | Freq: Two times a day (BID) | ORAL | 0 refills | Status: AC

## 2019-12-25 NOTE — Telephone Encounter (Signed)
Patient called and states he is getting sick every time he takes the Bactrim. I spoke to [facility] and per her request Augmentin was sent to the pharmacy and patient was notified to stop the Bactrim and Start the Augmentin.

## 2019-12-25 NOTE — Telephone Encounter (Signed)
Error

## 2019-12-27 ENCOUNTER — Inpatient Hospital Stay: Payer: Medicare HMO

## 2019-12-27 ENCOUNTER — Emergency Department: Payer: Medicare HMO

## 2019-12-27 ENCOUNTER — Other Ambulatory Visit: Payer: Self-pay

## 2019-12-27 ENCOUNTER — Encounter: Payer: Self-pay | Admitting: Intensive Care

## 2019-12-27 ENCOUNTER — Inpatient Hospital Stay
Admission: EM | Admit: 2019-12-27 | Discharge: 2019-12-31 | DRG: 247 | Disposition: A | Discharge status: Home / Self Care | Payer: Medicare HMO | Attending: Internal Medicine | Admitting: Internal Medicine

## 2019-12-27 DIAGNOSIS — M1000 Idiopathic gout, unspecified site: Secondary | ICD-10-CM

## 2019-12-27 DIAGNOSIS — I10 Essential (primary) hypertension: Secondary | ICD-10-CM | POA: Insufficient documentation

## 2019-12-27 DIAGNOSIS — I129 Hypertensive chronic kidney disease with stage 1 through stage 4 chronic kidney disease, or unspecified chronic kidney disease: Secondary | ICD-10-CM | POA: Diagnosis not present

## 2019-12-27 DIAGNOSIS — M79662 Pain in left lower leg: Secondary | ICD-10-CM | POA: Diagnosis not present

## 2019-12-27 DIAGNOSIS — I2510 Atherosclerotic heart disease of native coronary artery without angina pectoris: Secondary | ICD-10-CM | POA: Diagnosis present

## 2019-12-27 DIAGNOSIS — N3090 Cystitis, unspecified without hematuria: Secondary | ICD-10-CM | POA: Diagnosis present

## 2019-12-27 DIAGNOSIS — N1832 Chronic kidney disease, stage 3b: Secondary | ICD-10-CM | POA: Diagnosis present

## 2019-12-27 DIAGNOSIS — N189 Chronic kidney disease, unspecified: Secondary | ICD-10-CM | POA: Insufficient documentation

## 2019-12-27 DIAGNOSIS — Z20822 Contact with and (suspected) exposure to covid-19: Secondary | ICD-10-CM | POA: Diagnosis not present

## 2019-12-27 DIAGNOSIS — I214 Non-ST elevation (NSTEMI) myocardial infarction: Secondary | ICD-10-CM

## 2019-12-27 DIAGNOSIS — N178 Other acute kidney failure: Secondary | ICD-10-CM | POA: Diagnosis not present

## 2019-12-27 DIAGNOSIS — N390 Urinary tract infection, site not specified: Secondary | ICD-10-CM | POA: Diagnosis not present

## 2019-12-27 DIAGNOSIS — E785 Hyperlipidemia, unspecified: Secondary | ICD-10-CM | POA: Diagnosis not present

## 2019-12-27 DIAGNOSIS — Z86711 Personal history of pulmonary embolism: Secondary | ICD-10-CM | POA: Diagnosis not present

## 2019-12-27 DIAGNOSIS — N179 Acute kidney failure, unspecified: Secondary | ICD-10-CM | POA: Diagnosis present

## 2019-12-27 DIAGNOSIS — I252 Old myocardial infarction: Secondary | ICD-10-CM | POA: Insufficient documentation

## 2019-12-27 DIAGNOSIS — E119 Type 2 diabetes mellitus without complications: Secondary | ICD-10-CM

## 2019-12-27 DIAGNOSIS — E1122 Type 2 diabetes mellitus with diabetic chronic kidney disease: Secondary | ICD-10-CM | POA: Diagnosis not present

## 2019-12-27 DIAGNOSIS — M79604 Pain in right leg: Secondary | ICD-10-CM | POA: Diagnosis not present

## 2019-12-27 DIAGNOSIS — Z8249 Family history of ischemic heart disease and other diseases of the circulatory system: Secondary | ICD-10-CM

## 2019-12-27 DIAGNOSIS — Z9861 Coronary angioplasty status: Secondary | ICD-10-CM | POA: Diagnosis not present

## 2019-12-27 DIAGNOSIS — R52 Pain, unspecified: Secondary | ICD-10-CM

## 2019-12-27 DIAGNOSIS — Z8739 Personal history of other diseases of the musculoskeletal system and connective tissue: Secondary | ICD-10-CM | POA: Diagnosis not present

## 2019-12-27 DIAGNOSIS — B9620 Unspecified Escherichia coli [E. coli] as the cause of diseases classified elsewhere: Secondary | ICD-10-CM | POA: Diagnosis present

## 2019-12-27 DIAGNOSIS — R972 Elevated prostate specific antigen [PSA]: Secondary | ICD-10-CM | POA: Diagnosis present

## 2019-12-27 DIAGNOSIS — R079 Chest pain, unspecified: Secondary | ICD-10-CM | POA: Diagnosis not present

## 2019-12-27 DIAGNOSIS — Z720 Tobacco use: Secondary | ICD-10-CM | POA: Diagnosis not present

## 2019-12-27 DIAGNOSIS — F17220 Nicotine dependence, chewing tobacco, uncomplicated: Secondary | ICD-10-CM | POA: Diagnosis present

## 2019-12-27 DIAGNOSIS — E782 Mixed hyperlipidemia: Secondary | ICD-10-CM | POA: Diagnosis not present

## 2019-12-27 DIAGNOSIS — E875 Hyperkalemia: Secondary | ICD-10-CM | POA: Diagnosis not present

## 2019-12-27 DIAGNOSIS — R300 Dysuria: Secondary | ICD-10-CM | POA: Diagnosis not present

## 2019-12-27 DIAGNOSIS — Z794 Long term (current) use of insulin: Secondary | ICD-10-CM

## 2019-12-27 DIAGNOSIS — N183 Chronic kidney disease, stage 3 unspecified: Secondary | ICD-10-CM | POA: Diagnosis not present

## 2019-12-27 DIAGNOSIS — E1169 Type 2 diabetes mellitus with other specified complication: Secondary | ICD-10-CM

## 2019-12-27 DIAGNOSIS — M109 Gout, unspecified: Secondary | ICD-10-CM | POA: Diagnosis present

## 2019-12-27 HISTORY — DX: Non-ST elevation (NSTEMI) myocardial infarction: I21.4

## 2019-12-27 LAB — HEPATIC FUNCTION PANEL
ALT: 19 U/L (ref 0–44)
AST: 25 U/L (ref 15–41)
Albumin: 4.3 g/dL (ref 3.5–5.0)
Alkaline Phosphatase: 58 U/L (ref 38–126)
Bilirubin, Direct: <0.1 mg/dL (ref 0.0–0.2)
Total Bilirubin: 0.6 mg/dL (ref 0.3–1.2)
Total Protein: 8.5 g/dL — ABNORMAL HIGH (ref 6.5–8.1)

## 2019-12-27 LAB — BASIC METABOLIC PANEL
Anion gap: 9 (ref 5–15)
BUN: 39 mg/dL — ABNORMAL HIGH (ref 8–23)
CO2: 15 mmol/L — ABNORMAL LOW (ref 22–32)
Calcium: 10.2 mg/dL (ref 8.9–10.3)
Chloride: 109 mmol/L (ref 98–111)
Creatinine, Ser: 2.7 mg/dL — ABNORMAL HIGH (ref 0.61–1.24)
GFR calc Af Amer: 27 mL/min — ABNORMAL LOW (ref >60)
GFR calc non Af Amer: 23 mL/min — ABNORMAL LOW (ref >60)
Glucose, Bld: 182 mg/dL — ABNORMAL HIGH (ref 70–99)
Potassium: 6.9 mmol/L (ref 3.5–5.1)
Sodium: 133 mmol/L — ABNORMAL LOW (ref 135–145)

## 2019-12-27 LAB — CK: Total CK: 160 U/L (ref 49–397)

## 2019-12-27 LAB — CBC
HCT: 43 % (ref 39.0–52.0)
Hemoglobin: 13.8 g/dL (ref 13.0–17.0)
MCH: 29.1 pg (ref 26.0–34.0)
MCHC: 32.1 g/dL (ref 30.0–36.0)
MCV: 90.5 fL (ref 80.0–100.0)
Platelets: 430 10*3/uL — ABNORMAL HIGH (ref 150–400)
RBC: 4.75 MIL/uL (ref 4.22–5.81)
RDW: 14.4 % (ref 11.5–15.5)
WBC: 7 10*3/uL (ref 4.0–10.5)
nRBC: 0 % (ref 0.0–0.2)

## 2019-12-27 LAB — GLUCOSE, CAPILLARY: Glucose-Capillary: 132 mg/dL — ABNORMAL HIGH (ref 70–99)

## 2019-12-27 LAB — RESPIRATORY PANEL BY RT PCR (FLU A&B, COVID)
Influenza A by PCR: NEGATIVE
Influenza B by PCR: NEGATIVE
SARS Coronavirus 2 by RT PCR: NEGATIVE

## 2019-12-27 LAB — TROPONIN I (HIGH SENSITIVITY)
Troponin I (High Sensitivity): 4 ng/L (ref <18)
Troponin I (High Sensitivity): 33 ng/L — ABNORMAL HIGH (ref <18)

## 2019-12-27 MED ORDER — SODIUM BICARBONATE 8.4 % IV SOLN
50.0000 meq | Freq: Once | INTRAVENOUS | Status: AC
  Administered 2019-12-27: 50 meq via INTRAVENOUS
  Filled 2019-12-27: qty 50

## 2019-12-27 MED ORDER — ACETAMINOPHEN 325 MG PO TABS
650.0000 mg | ORAL_TABLET | Freq: Four times a day (QID) | ORAL | Status: DC | PRN
  Administered 2019-12-29 – 2019-12-30 (×4): 650 mg via ORAL
  Filled 2019-12-27 (×4): qty 2

## 2019-12-27 MED ORDER — ASPIRIN EC 81 MG PO TBEC
81.0000 mg | DELAYED_RELEASE_TABLET | Freq: Every day | ORAL | Status: DC
  Administered 2019-12-27 – 2019-12-31 (×4): 81 mg via ORAL
  Filled 2019-12-27 (×5): qty 1

## 2019-12-27 MED ORDER — INSULIN ASPART 100 UNIT/ML ~~LOC~~ SOLN
0.0000 [IU] | Freq: Three times a day (TID) | SUBCUTANEOUS | Status: DC
  Administered 2019-12-28 (×3): 2 [IU] via SUBCUTANEOUS
  Administered 2019-12-29: 1 [IU] via SUBCUTANEOUS
  Administered 2019-12-29: 2 [IU] via SUBCUTANEOUS
  Administered 2019-12-30: 1 [IU] via SUBCUTANEOUS
  Administered 2019-12-30 – 2019-12-31 (×2): 2 [IU] via SUBCUTANEOUS
  Filled 2019-12-27 (×8): qty 1

## 2019-12-27 MED ORDER — SODIUM CHLORIDE 0.9 % IV BOLUS
500.0000 mL | Freq: Once | INTRAVENOUS | Status: AC
  Administered 2019-12-27: 500 mL via INTRAVENOUS

## 2019-12-27 MED ORDER — SODIUM CHLORIDE 0.9 % IV SOLN
1.0000 g | INTRAVENOUS | Status: DC
  Administered 2019-12-27 – 2019-12-30 (×4): 1 g via INTRAVENOUS
  Filled 2019-12-27 (×6): qty 10

## 2019-12-27 MED ORDER — HEPARIN (PORCINE) 25000 UT/250ML-% IV SOLN
1250.0000 [IU]/h | INTRAVENOUS | Status: DC
  Administered 2019-12-27: 1300 [IU]/h via INTRAVENOUS
  Administered 2019-12-29 – 2019-12-30 (×2): 1250 [IU]/h via INTRAVENOUS
  Filled 2019-12-27 (×4): qty 250

## 2019-12-27 MED ORDER — INSULIN ASPART 100 UNIT/ML ~~LOC~~ SOLN
6.0000 [IU] | Freq: Once | SUBCUTANEOUS | Status: AC
  Administered 2019-12-27: 6 [IU] via INTRAVENOUS
  Filled 2019-12-27: qty 1

## 2019-12-27 MED ORDER — SODIUM ZIRCONIUM CYCLOSILICATE 10 G PO PACK
10.0000 g | PACK | Freq: Once | ORAL | Status: AC
  Administered 2019-12-27: 10 g via ORAL
  Filled 2019-12-27: qty 1

## 2019-12-27 MED ORDER — DEXTROSE 50 % IV SOLN
1.0000 | Freq: Once | INTRAVENOUS | Status: AC
  Administered 2019-12-27: 50 mL via INTRAVENOUS
  Filled 2019-12-27: qty 50

## 2019-12-27 MED ORDER — FLUTICASONE PROPIONATE 50 MCG/ACT NA SUSP
2.0000 | Freq: Every day | NASAL | Status: DC | PRN
  Filled 2019-12-27: qty 16

## 2019-12-27 MED ORDER — CALCIUM CARBONATE ANTACID 500 MG PO CHEW: 2.0000 | CHEWABLE_TABLET | Freq: Every evening | ORAL | Status: DC | PRN

## 2019-12-27 MED ORDER — DOCUSATE SODIUM 100 MG PO CAPS
100.0000 mg | ORAL_CAPSULE | Freq: Two times a day (BID) | ORAL | Status: DC
  Administered 2019-12-27 – 2019-12-31 (×8): 100 mg via ORAL
  Filled 2019-12-27 (×8): qty 1

## 2019-12-27 MED ORDER — AMLODIPINE BESYLATE 5 MG PO TABS: 2.5000 mg | ORAL_TABLET | Freq: Every day | ORAL | Status: DC

## 2019-12-27 MED ORDER — ACETAMINOPHEN 650 MG RE SUPP: 650.0000 mg | Freq: Four times a day (QID) | RECTAL | Status: DC | PRN

## 2019-12-27 MED ORDER — PANTOPRAZOLE SODIUM 40 MG PO TBEC
40.0000 mg | DELAYED_RELEASE_TABLET | Freq: Every day | ORAL | Status: DC
  Administered 2019-12-28 – 2019-12-31 (×4): 40 mg via ORAL
  Filled 2019-12-27 (×4): qty 1

## 2019-12-27 MED ORDER — HEPARIN SODIUM (PORCINE) 5000 UNIT/ML IJ SOLN: 5000.0000 [IU] | Freq: Three times a day (TID) | INTRAMUSCULAR | Status: DC

## 2019-12-27 MED ORDER — INSULIN ASPART 100 UNIT/ML ~~LOC~~ SOLN
0.0000 [IU] | Freq: Every day | SUBCUTANEOUS | Status: DC
  Administered 2019-12-28 – 2019-12-29 (×2): 2 [IU] via SUBCUTANEOUS
  Filled 2019-12-27 (×2): qty 1

## 2019-12-27 MED ORDER — CALCIUM GLUCONATE 10 % IV SOLN
1.0000 g | Freq: Once | INTRAVENOUS | Status: AC
  Administered 2019-12-27: 1 g via INTRAVENOUS
  Filled 2019-12-27: qty 10

## 2019-12-27 MED ORDER — ALLOPURINOL 100 MG PO TABS
100.0000 mg | ORAL_TABLET | Freq: Every day | ORAL | Status: DC
  Administered 2019-12-27 – 2019-12-30 (×4): 100 mg via ORAL
  Filled 2019-12-27 (×7): qty 1

## 2019-12-27 MED ORDER — INSULIN GLARGINE 100 UNIT/ML ~~LOC~~ SOLN
6.0000 [IU] | Freq: Every day | SUBCUTANEOUS | Status: DC
  Administered 2019-12-27 – 2019-12-30 (×4): 6 [IU] via SUBCUTANEOUS
  Filled 2019-12-27 (×8): qty 0.06

## 2019-12-27 MED ORDER — ONDANSETRON HCL 4 MG PO TABS: 4.0000 mg | ORAL_TABLET | Freq: Four times a day (QID) | ORAL | Status: DC | PRN

## 2019-12-27 MED ORDER — ONDANSETRON HCL 4 MG/2ML IJ SOLN: 4.0000 mg | Freq: Four times a day (QID) | INTRAMUSCULAR | Status: DC | PRN

## 2019-12-27 MED ORDER — STERILE WATER FOR INJECTION IV SOLN
INTRAVENOUS | Status: DC
  Filled 2019-12-27 (×3): qty 850
  Filled 2019-12-27: qty 150
  Filled 2019-12-27 (×4): qty 850
  Filled 2019-12-27: qty 150
  Filled 2019-12-27: qty 850
  Filled 2019-12-27: qty 150
  Filled 2019-12-27: qty 850
  Filled 2019-12-27 (×2): qty 150
  Filled 2019-12-27 (×3): qty 850

## 2019-12-27 MED ORDER — METOPROLOL TARTRATE 25 MG PO TABS
12.5000 mg | ORAL_TABLET | Freq: Two times a day (BID) | ORAL | Status: DC
  Administered 2019-12-27 – 2019-12-29 (×5): 12.5 mg via ORAL
  Filled 2019-12-27 (×6): qty 1

## 2019-12-27 MED ORDER — HEPARIN BOLUS VIA INFUSION
4000.0000 [IU] | Freq: Once | INTRAVENOUS | Status: AC
  Administered 2019-12-27: 4000 [IU] via INTRAVENOUS
  Filled 2019-12-27: qty 4000

## 2019-12-27 MED ORDER — BASAGLAR KWIKPEN 100 UNIT/ML ~~LOC~~ SOPN: 6.0000 [IU] | PEN_INJECTOR | Freq: Every day | SUBCUTANEOUS | Status: DC

## 2019-12-27 NOTE — ED Notes (Signed)
Pt reports CP and SHOB that started today, leg cramping that started a week ago.  Pt speaking in complete sentences. RR even and unlabored. NAD noted. Alert and oriented.

## 2019-12-27 NOTE — ED Notes (Signed)
Next trop sent to the lab.

## 2019-12-27 NOTE — Progress Notes (Signed)
Patient ID: [name], male   DOB: [birth year], 69 y.o.   MRN: [id number]  Second troponin elevated.  Unclear if this is cardiac or not but will start aspirin heparin and beta-blocker.  Cardiology consultation.  Will also get ultrasound of the lower extremity to rule out DVT.  Dr Alford Highland

## 2019-12-27 NOTE — ED Triage Notes (Signed)
Patient c/o central chest pain that radiates down left arm. Started while working outside

## 2019-12-27 NOTE — Consult Note (Signed)
[address] [place], [place] [address] Phone [phone]. Fax [phone]  Date: 12/27/2019                  Patient Name:  [name]  MRN: [id number]  DOB: 1950-09-04  Age / Sex: 69 y.o., male         PCP: [name], DO                 Service Requesting Consult: IM/ Alford Highland, MD                 Reason for Consult: ARF, Hyperkalemia            History of Present Illness: Patient is a 69 y.o. male was admitted to [facility] on 12/27/2019   Patient presents to the hospital for chest pain, weakness.  2 to 3 weeks ago he had prostate biopsy and was prescribed antibiotics.  He took Bactrim for a week and a half but was having nausea and vomiting.  Eventually it was switched to amoxicillin.  Evaluation in the emergency room showed elevated creatinine and potassium of 6.9 Baseline creatinine of 1.79 on November 12, 2019/GFR 38 Nephrology consult has now been requested for evaluation  Patient reports poor oral intake last few days prior to admission    Medications: Outpatient medications: (Not in a hospital admission)   Current medications: Current Facility-Administered Medications  Medication Dose Route Frequency Provider Last Rate Last Admin  . acetaminophen (TYLENOL) tablet 650 mg  650 mg Oral Q6H PRN Alford Highland, MD       Or  . acetaminophen (TYLENOL) suppository 650 mg  650 mg Rectal Q6H PRN Wieting, Richard, MD      . allopurinol (ZYLOPRIM) tablet 100 mg  100 mg Oral QHS Wieting, Richard, MD      . amLODipine (NORVASC) tablet 2.5 mg  2.5 mg Oral QHS Wieting, Richard, MD      . aspirin EC tablet 81 mg  81 mg Oral Daily Wieting, Richard, MD      . Mariella Saa KwikPen KwikPen 6 Units  6 Units Subcutaneous QHS Renae Gloss, Richard, MD      . calcium carbonate (TUMS - dosed in mg elemental calcium) chewable tablet 400 mg of elemental calcium  2 tablet Oral QHS PRN Wieting, Richard, MD      . cefTRIAXone (ROCEPHIN) 1 g in sodium chloride  0.9 % 100 mL IVPB  1 g Intravenous Q24H Wieting, Richard, MD      . docusate sodium (COLACE) capsule 100 mg  100 mg Oral BID Alford Highland, MD      . Melene Muller ON 12/28/2019] fluticasone (FLONASE) 50 MCG/ACT nasal spray 2 spray  2 spray Each Nare Daily PRN Wieting, Richard, MD      . insulin aspart (novoLOG) injection 0-5 Units  0-5 Units Subcutaneous QHS Wieting, Richard, MD      . insulin aspart (novoLOG) injection 0-9 Units  0-9 Units Subcutaneous TID WC Wieting, Richard, MD      . ondansetron [facility]) tablet 4 mg  4 mg Oral Q6H PRN Wieting, Richard, MD       Or  . ondansetron (ZOFRAN) injection 4 mg  4 mg Intravenous Q6H PRN Wieting, Richard, MD      . pantoprazole (PROTONIX) EC tablet 40 mg  40 mg Oral Daily Wieting, Richard, MD      . sodium bicarbonate 150 mEq in sterile water 1,000 mL infusion   Intravenous Continuous Willy Eddy, MD 125  mL/hr at 12/27/19 1537 New Bag at 12/27/19 1537   Current Outpatient Medications  Medication Sig Dispense Refill  . ACCU-CHEK SMARTVIEW test strip USE 1 STRIP TO CHECK GLUCOSE TWICE DAILY 100 each 5  . ACCU-CHEK SMARTVIEW test strip Check sugar 2 times daily 100 each 5  . acetaminophen (TYLENOL) 500 MG tablet Take 1,000 mg by mouth every 8 (eight) hours as needed.     Marland Kitchen. allopurinol (ZYLOPRIM) 100 MG tablet TAKE 1 TABLET AT BEDTIME (Patient taking differently: Take 100 mg by mouth at bedtime. ) 90 tablet 1  . amLODipine (NORVASC) 2.5 MG tablet TAKE 1 TABLET AT BEDTIME (Patient taking differently: Take 2.5 mg by mouth daily. ) 90 tablet 0  . amoxicillin-clavulanate (AUGMENTIN) 875-125 MG tablet Take 1 tablet by mouth 2 (two) times daily for 10 days. 60 tablet 0  . BYDUREON BCISE 2 MG/0.85ML AUIJ INJECT 2 MG SUBCUTANEOUSLY ONCE A WEEK (Patient taking differently: Inject 2 mg into the skin once a week. ) 12 mL 3  . calcium carbonate (TUMS - DOSED IN MG ELEMENTAL CALCIUM) 500 MG chewable tablet Chew 2 tablets by mouth at bedtime as needed for indigestion  or heartburn.     . colchicine 0.6 MG tablet Take 1 tablet (0.6 mg total) by mouth as needed (gout flare). 90 tablet 3  . docusate sodium (COLACE) 100 MG capsule Take 100 mg by mouth 2 (two) times daily.     . fluticasone (FLONASE) 50 MCG/ACT nasal spray USE 2 SPRAYS IN EACH NOSTRIL ONCE DAILY USE FOR 4 TO 6 WEEKS, THEN STOP AND USE SEASONALLY OR AS NEEDED. 48 g 0  . Insulin Glargine ([name] KWIKPEN) 100 UNIT/ML Inject 19 Units into the skin at bedtime.    . Insulin Pen Needle (NOVOFINE PLUS) 32G X 4 MM MISC Use with Lantus insulin daily injection as instructed 90 each 3  . lisinopril (ZESTRIL) 40 MG tablet TAKE 1 TABLET EVERY DAY (Patient taking differently: Take 40 mg by mouth daily. ) 90 tablet 0  . metFORMIN (GLUCOPHAGE-XR) 750 MG 24 hr tablet TAKE 2 TABLETS EVERY DAY WITH BREAKFAST (Patient taking differently: Take 1,500 mg by mouth daily with breakfast. ) 180 tablet 0  . omeprazole (PRILOSEC) 20 MG capsule TAKE 1 CAPSULE EVERY DAY (Patient taking differently: Take 20 mg by mouth daily. ) 90 capsule 0      Allergies: Allergies  Allergen Reactions  . Crestor [Rosuvastatin Calcium] Other (See Comments)    myalgia  . Invokana [Canagliflozin] Other (See Comments)    dehydrated  . Prednisone Other (See Comments)      Past Medical History: Past Medical History:  Diagnosis Date  . Allergy   . Chronic headaches   . Diabetes mellitus without complication (HCC)   . GERD (gastroesophageal reflux disease)   . Heart burn   . Hyperlipidemia   . Hypertension      Past Surgical History: Past Surgical History:  Procedure Laterality Date  . KNEE SURGERY    . SHOULDER SURGERY    . TONSILLECTOMY       Family History: Family History  Problem Relation Age of Onset  . Cancer Father        lung  . Heart disease Sister   . Heart disease Brother   . Prostate cancer Neg Hx      Social History: Social History   Socioeconomic History  . Marital status: Married    Spouse name:  Not on file  . Number of children: Not  on file  . Years of education: Not on file  . Highest education level: Not on file  Occupational History  . Occupation: retired  Tobacco Use  . Smoking status: Former Smoker    Types: Cigars    Quit date: 2000    Years since quitting: 21.7  . Smokeless tobacco: Current User    Types: Chew  . Tobacco comment: wife states patient never smoked cigarettes, just chewing tobacco  Vaping Use  . Vaping Use: Never used  Substance and Sexual Activity  . Alcohol use: Not Currently    Alcohol/week: 0.0 standard drinks  . Drug use: No  . Sexual activity: Yes    Birth control/protection: Inserts  Other Topics Concern  . Not on file  Social History Narrative  . Not on file   Social Determinants of Health   Financial Resource Strain:   . Difficulty of Paying Living Expenses: Not on file  Food Insecurity:   . Worried About Programme researcher, broadcasting/film/video in the Last Year: Not on file  . Ran Out of Food in the Last Year: Not on file  Transportation Needs:   . Lack of Transportation (Medical): Not on file  . Lack of Transportation (Non-Medical): Not on file  Physical Activity:   . Days of Exercise per Week: Not on file  . Minutes of Exercise per Session: Not on file  Stress:   . Feeling of Stress : Not on file  Social Connections:   . Frequency of Communication with Friends and Family: Not on file  . Frequency of Social Gatherings with Friends and Family: Not on file  . Attends Religious Services: Not on file  . Active Member of Clubs or Organizations: Not on file  . Attends Banker Meetings: Not on file  . Marital Status: Not on file  Intimate Partner Violence:   . Fear of Current or Ex-Partner: Not on file  . Emotionally Abused: Not on file  . Physically Abused: Not on file  . Sexually Abused: Not on file     Review of Systems: Gen: Reports generalized weakness. No fevers or chills HEENT: No vision or hearing complaints CV: Reports  chest pain off and on. Resp: No cough or sputum production GI: No diarrhea GU : Had prostate biopsy recently MS: No complaints Derm:    No complaints Psych: No complaints Heme: No complaints Neuro: No complaints Endocrine: No complaints  Vital Signs: Blood pressure 121/72, pulse 97, temperature 97.6 F (36.4 C), temperature source Oral, resp. rate 16, height 5\' 11"  (1.803 m), weight 106.1 kg, SpO2 99 %.  No intake or output data in the 24 hours ending 12/27/19 1634  Weight trends: Filed Weights   12/27/19 1326  Weight: 106.1 kg   Physical Exam: General:  No acute distress, laying in the bed  HEENT  anicteric, moist oral mucous membrane  Pulm/lungs  normal breathing effort, lungs are clear to auscultation  CVS/Heart  regular rhythm, no rub or gallop  Abdomen:   Soft, nontender  Extremities:  No peripheral edema  Neurologic:  Alert, oriented, able to follow commands  Skin:  No acute rashes      Lab results: Basic Metabolic Panel: Recent Labs  Lab 12/27/19 1329  NA 133*  K 6.9*  CL 109  CO2 15*  GLUCOSE 182*  BUN 39*  CREATININE 2.70*  CALCIUM 10.2    Liver Function Tests: Recent Labs  Lab 12/27/19 1329  AST 25  ALT 19  ALKPHOS 58  BILITOT 0.6  PROT 8.5*  ALBUMIN 4.3   No results for input(s): LIPASE, AMYLASE in the last 168 hours. No results for input(s): AMMONIA in the last 168 hours.  CBC: Recent Labs  Lab 12/27/19 1329  WBC 7.0  HGB 13.8  HCT 43.0  MCV 90.5  PLT 430*    Cardiac Enzymes: Recent Labs  Lab 12/27/19 1329  CKTOTAL 160    BNP: Invalid input(s): POCBNP  CBG: No results for input(s): GLUCAP in the last 168 hours.  Microbiology: Recent Results (from the past 720 hour(s))  Microscopic Examination     Status: Abnormal   Collection Time: 12/10/19  9:26 AM   Urine  Result Value Ref Range Status   WBC, UA >30 (A) 0 - 5 /hpf Final   RBC >30 (A) 0 - 2 /hpf Final   Epithelial Cells (non renal) 0-10 0 - 10 /hpf Final    Renal Epithel, UA 0-10 (A) None seen /hpf Final   Bacteria, UA Moderate (A) None seen/Few Final  CULTURE, URINE COMPREHENSIVE     Status: Abnormal   Collection Time: 12/10/19 10:06 AM   Specimen: Urine   UR  Result Value Ref Range Status   Urine Culture, Comprehensive Final report (A)  Final   Organism ID, Bacteria Escherichia coli (A)  Final    Comment: Cefazolin <=4 ug/mL Cefazolin with an MIC <=16 predicts susceptibility to the oral agents cefaclor, cefdinir, cefpodoxime, cefprozil, cefuroxime, cephalexin, and loracarbef when used for therapy of uncomplicated urinary tract infections due to E. coli, Klebsiella pneumoniae, and Proteus mirabilis. 50,000-100,000 colony forming units per mL    ANTIMICROBIAL SUSCEPTIBILITY Comment  Final    Comment:       ** S = Susceptible; I = Intermediate; R = Resistant **                    P = Positive; N = Negative             MICS are expressed in micrograms per mL    Antibiotic                 RSLT#1    RSLT#2    RSLT#3    RSLT#4 Amoxicillin/Clavulanic Acid    S Ampicillin                     S Cefepime                       S Ceftriaxone                    S Cefuroxime                     S Ciprofloxacin                  R Ertapenem                      S Gentamicin                     S Imipenem                       S Levofloxacin                   R Meropenem  S Nitrofurantoin                 S Piperacillin/Tazobactam        S Tetracycline                   S Tobramycin                     S Trimethoprim/Sulfa             S      Coagulation Studies: No results for input(s): LABPROT, INR in the last 72 hours.  Urinalysis: No results for input(s): COLORURINE, LABSPEC, PHURINE, GLUCOSEU, HGBUR, BILIRUBINUR, KETONESUR, PROTEINUR, UROBILINOGEN, NITRITE, LEUKOCYTESUR in the last 72 hours.  Invalid input(s): APPERANCEUR      Imaging: DG Chest 2 View  Result Date: 12/27/2019 CLINICAL DATA:  Chest pain left  arm pain EXAM: CHEST - 2 VIEW COMPARISON:  02/05/2019 FINDINGS: The heart size and mediastinal contours are within normal limits. Both lungs are clear. The visualized skeletal structures are unremarkable. IMPRESSION: No active cardiopulmonary disease. Electronically Signed   By: Marlan Palau M.D.   On: 12/27/2019 14:10      Assessment & Plan: Pt is a 69 y.o.   male with diabetes, hypertension, CKD, history of knee surgery, recurrent E. coli UTI, elevated PSA, prostate biopsy by Dr. Lonna Cobb in September 2021 was admitted on 12/27/2019 with Hyperkalemia [E87.5]   #Acute kidney injury on chronic kidney disease stage IIIb Baseline creatinine of 1.78/GFR 38 on November 12, 2019 Admission creatinine of 2.70 AKI likely secondary to use of Bactrim and possibly ATN from volume depletion in setting of taking ACE inhibitor -Hold lisinopril -Hold Metformin Monitor daily labs  #Severe hyperkalemia Agree with use of Lokelma for hyperkalemia Agree with IV sodium bicarbonate which will help with hydration as well as correcting potassium  #Diabetes type 2 with CKD Lab Results  Component Value Date   HGBA1C 8.9 (H) 11/12/2019  Diabetes is insulin-dependent  #Dysuria, elevated PSA History of Bactrim resistant multiple UTIs Acute cystitis versus bacterial prostatitis Prostate biopsy on December 04, 2019 by Dr. Lonna Cobb      LOS: 0 Johnisha Louks Thedore Mins 10/1/20214:34 PM    Note: This note was prepared with Dragon dictation. Any transcription errors are unintentional

## 2019-12-27 NOTE — ED Provider Notes (Signed)
[facility] Emergency Department Provider Note    First MD Initiated Contact with Patient 12/27/19 1443     (approximate)  I have reviewed the triage vital signs and the nursing notes.   HISTORY  Chief Complaint Chest Pain    HPI Daniel SanesKenneth Eugene Berg is a 69 y.o. male presents to the ER for evaluation of chest pain and left arm discomfort malaise. States he is also having nausea vomiting and generalized muscle aches. Has been working outside. Was recently put on Septra for cystitis and switched to amoxicillin due to intolerance of this medication. Denies any recent fevers cough or shortness of breath.    Past Medical History:  Diagnosis Date  . Allergy   . Chronic headaches   . Diabetes mellitus without complication (HCC)   . GERD (gastroesophageal reflux disease)   . Heart burn   . Hyperlipidemia   . Hypertension    Family History  Problem Relation Age of Onset  . Cancer Father        lung  . Heart disease Sister   . Heart disease Brother   . Prostate cancer Neg Hx    Past Surgical History:  Procedure Laterality Date  . KNEE SURGERY    . SHOULDER SURGERY    . TONSILLECTOMY     Patient Active Problem List   Diagnosis Date Noted  . Hyperkalemia 12/27/2019  . Elevated PSA, less than 10 ng/ml 10/03/2017  . Lumbar radiculopathy 10/03/2017  . Drug-induced myopathy 10/03/2017  . Obesity (BMI 30.0-34.9) 10/03/2017  . History of pulmonary embolus (PE) 12/05/2016  . Chronic pain of left knee 10/07/2016  . CKD (chronic kidney disease), stage III (HCC) 10/07/2016  . Muscle strain 11/17/2015  . Low back pain 10/08/2015  . Hx of hematuria 07/16/2015  . Ganglion cyst of left foot 01/23/2015  . Smokeless tobacco use 01/12/2015  . Type 2 diabetes mellitus with diabetic nephropathy (HCC) 10/16/2014  . Benign hypertension with CKD (chronic kidney disease) stage III (HCC) 10/16/2014  . Hyperlipidemia associated with type 2 diabetes mellitus (HCC)  10/16/2014  . Gout 10/16/2014  . GERD without esophagitis 10/16/2014  . Injury of tendon of upper extremity 05/29/2014  . Glenoid labral tear 05/29/2014  . Complete rotator cuff rupture of left shoulder 05/09/2014  . Infraspinatus tenosynovitis 05/09/2014  . Other synovitis and tenosynovitis, right shoulder 05/09/2014      Prior to Admission medications   Medication Sig Start Date End Date Taking? Authorizing Provider  ACCU-CHEK SMARTVIEW test strip USE 1 STRIP TO CHECK GLUCOSE TWICE DAILY 07/04/19   Althea CharonKaramalegos, Netta NeatAlexander J, DO  ACCU-CHEK SMARTVIEW test strip Check sugar 2 times daily 07/03/19   Smitty CordsKaramalegos, Alexander J, DO  acetaminophen (TYLENOL) 500 MG tablet Take 1,000 mg by mouth every 8 (eight) hours as needed.     [provider]  allopurinol (ZYLOPRIM) 100 MG tablet TAKE 1 TABLET AT BEDTIME 10/25/19   Karamalegos, Alexander J, DO  amLODipine (NORVASC) 2.5 MG tablet TAKE 1 TABLET AT BEDTIME 10/25/19   Karamalegos, Alexander J, DO  amoxicillin-clavulanate (AUGMENTIN) 875-125 MG tablet Take 1 tablet by mouth 2 (two) times daily for 10 days. 12/25/19 01/04/20  McGowan, Wellington HampshireShannon A, PA-C  BYDUREON BCISE 2 MG/0.85ML AUIJ INJECT 2 MG SUBCUTANEOUSLY ONCE A WEEK 06/11/19   Karamalegos, Netta NeatAlexander J, DO  calcium carbonate (TUMS - DOSED IN MG ELEMENTAL CALCIUM) 500 MG chewable tablet Chew 2 tablets by mouth at bedtime as needed for indigestion or heartburn.  [provider]  colchicine 0.6 MG tablet Take 1 tablet (0.6 mg total) by mouth as needed (gout flare). 04/04/17   Karamalegos, Netta Neat, DO  docusate sodium (COLACE) 100 MG capsule Take 100 mg by mouth 2 (two) times daily.     [provider]  fluticasone (FLONASE) 50 MCG/ACT nasal spray USE 2 SPRAYS IN EACH NOSTRIL ONCE DAILY USE FOR 4 TO 6 WEEKS, THEN STOP AND USE SEASONALLY OR AS NEEDED. 11/27/19   Althea Charon, Netta Neat, DO  Insulin Glargine ([name] KWIKPEN) 100 UNIT/ML Inject 19 Units into the skin at bedtime.     [provider]  Insulin Pen Needle (NOVOFINE PLUS) 32G X 4 MM MISC Use with Lantus insulin daily injection as instructed 06/19/17   Althea Charon, Netta Neat, DO  lisinopril (ZESTRIL) 40 MG tablet TAKE 1 TABLET EVERY DAY 10/25/19   Althea Charon, Netta Neat, DO  metFORMIN (GLUCOPHAGE-XR) 750 MG 24 hr tablet TAKE 2 TABLETS EVERY DAY WITH BREAKFAST 10/25/19   Karamalegos, Netta Neat, DO  omeprazole (PRILOSEC) 20 MG capsule TAKE 1 CAPSULE EVERY DAY 10/25/19   Althea Charon, Netta Neat, DO    Allergies Crestor [rosuvastatin calcium], Invokana [canagliflozin], and Prednisone    Social History Social History   Tobacco Use  . Smoking status: Former Smoker    Types: Cigars    Quit date: 2000    Years since quitting: 21.7  . Smokeless tobacco: Current User    Types: Chew  . Tobacco comment: wife states patient never smoked cigarettes, just chewing tobacco  Vaping Use  . Vaping Use: Never used  Substance Use Topics  . Alcohol use: Not Currently    Alcohol/week: 0.0 standard drinks  . Drug use: No    Review of Systems Patient denies headaches, rhinorrhea, blurry vision, numbness, shortness of breath, chest pain, edema, cough, abdominal pain, nausea, vomiting, diarrhea, dysuria, fevers, rashes or hallucinations unless otherwise stated above in HPI. ____________________________________________   PHYSICAL EXAM:  VITAL SIGNS: Vitals:   12/27/19 1326  BP: 121/72  Pulse: 97  Resp: 16  Temp: 97.6 F (36.4 C)  SpO2: 99%    Constitutional: Alert and oriented.  Eyes: Conjunctivae are normal.  Head: Atraumatic. Nose: No congestion/rhinnorhea. Mouth/Throat: Mucous membranes are moist.   Neck: No stridor. Painless ROM.  Cardiovascular: Normal rate, regular rhythm. Grossly normal heart sounds.  Good peripheral circulation to all four extremities Respiratory: Normal respiratory effort.  No retractions. Lungs CTAB. Gastrointestinal: Soft and nontender. No distention. No abdominal  bruits. No CVA tenderness. Genitourinary:  Musculoskeletal: No lower extremity tenderness nor edema.  No joint effusions.  Neurologic:  Normal speech and language. No gross focal neurologic deficits are appreciated. No facial droop Skin:  Skin is warm, dry and intact. No rash noted. Psychiatric: Mood and affect are normal. Speech and behavior are normal.  ____________________________________________   LABS (all labs ordered are listed, but only abnormal results are displayed)  Results for orders placed or performed during the hospital encounter of 12/27/19 (from the past 24 hour(s))  Basic metabolic panel     Status: Abnormal   Collection Time: 12/27/19  1:29 PM  Result Value Ref Range   Sodium 133 (L) 135 - 145 mmol/L   Potassium 6.9 (HH) 3.5 - 5.1 mmol/L   Chloride 109 98 - 111 mmol/L   CO2 15 (L) 22 - 32 mmol/L   Glucose, Bld 182 (H) 70 - 99 mg/dL   BUN 39 (H) 8 - 23 mg/dL   Creatinine, Ser 2.84 (H) 0.61 -  1.24 mg/dL   Calcium 73.4 8.9 - 28.7 mg/dL   GFR calc non Af Amer 23 (L) >60 mL/min   GFR calc Af Amer 27 (L) >60 mL/min   Anion gap 9 5 - 15  CBC     Status: Abnormal   Collection Time: 12/27/19  1:29 PM  Result Value Ref Range   WBC 7.0 4.0 - 10.5 K/uL   RBC 4.75 4.22 - 5.81 MIL/uL   Hemoglobin 13.8 13.0 - 17.0 g/dL   HCT 68.1 39 - 52 %   MCV 90.5 80.0 - 100.0 fL   MCH 29.1 26.0 - 34.0 pg   MCHC 32.1 30.0 - 36.0 g/dL   RDW 15.7 26.2 - 03.5 %   Platelets 430 (H) 150 - 400 K/uL   nRBC 0.0 0.0 - 0.2 %  Troponin I (High Sensitivity)     Status: None   Collection Time: 12/27/19  1:29 PM  Result Value Ref Range   Troponin I (High Sensitivity) 4 <18 ng/L   ____________________________________________  EKG My review and personal interpretation at Time: 13:27   Indication: hyperk  Rate: 95  Rhythm: sinus Axis: left Other: hyperacute t waves ____________________________________________  RADIOLOGY  I personally reviewed all radiographic images ordered to evaluate  for the above acute complaints and reviewed radiology reports and findings.  These findings were personally discussed with the patient.  Please see medical record for radiology report.  ____________________________________________   PROCEDURES  Procedure(s) performed:  .Critical Care Performed by: Willy Eddy, MD Authorized by: Willy Eddy, MD   Critical care provider statement:    Critical care time (minutes):  35   Critical care time was exclusive of:  Separately billable procedures and treating other patients   Critical care was necessary to treat or prevent imminent or life-threatening deterioration of the following conditions:  Renal failure   Critical care was time spent personally by me on the following activities:  Development of treatment plan with patient or surrogate, discussions with consultants, evaluation of patient's response to treatment, examination of patient, obtaining history from patient or surrogate, ordering and performing treatments and interventions, ordering and review of laboratory studies, ordering and review of radiographic studies, pulse oximetry, re-evaluation of patient's condition and review of old charts      Critical Care performed: yes ____________________________________________   INITIAL IMPRESSION / ASSESSMENT AND PLAN / ED COURSE  Pertinent labs & imaging results that were available during my care of the patient were reviewed by me and considered in my medical decision making (see chart for details).   DDX: Electrolyte abnormality, AKI, ACS, dissection, anemia, gastritis, enteritis, pneumonia, Covid, medication effect  [name] is a 69 y.o. who presents to the ED with presentation as described above.  Patient clinically is well-appearing with reassuring exam but EKG is concerning for hyperacute T waves and low reported out of triage shows evidence of acute hyperkalemia.  Upon receiving EKG patient was brought back to the ER  for evaluation.  Was given IV calcium, IV bicarb, IV insulin and fluids for temporization stabilization of his acute hyperkalemia.  I discussed the case in consultation with Dr. Thedore Mins of nephrology who is recommending bicarb drip.  I suspect this is likely secondary to Bactrim.  Does not have any signs of ACS.  This does not seem consistent with dissection doubt infectious process.  Patient will require hospitalization.     The patient was evaluated in Emergency Department today for the symptoms described in the history  of present illness. He/she was evaluated in the context of the global COVID-19 pandemic, which necessitated consideration that the patient might be at risk for infection with the SARS-CoV-2 virus that causes COVID-19. Institutional protocols and algorithms that pertain to the evaluation of patients at risk for COVID-19 are in a state of rapid change based on information released by regulatory bodies including the CDC and federal and state organizations. These policies and algorithms were followed during the patient's care in the ED.  As part of my medical decision making, I reviewed the following data within the electronic MEDICAL RECORD NUMBER Nursing notes reviewed and incorporated, Labs reviewed, notes from prior ED visits and [place] Controlled Substance Database   ____________________________________________   FINAL CLINICAL IMPRESSION(S) / ED DIAGNOSES  Final diagnoses:  Acute hyperkalemia      NEW MEDICATIONS STARTED DURING THIS VISIT:  New Prescriptions   No medications on file     Note:  This document was prepared using Dragon voice recognition software and may include unintentional dictation errors.    Willy Eddy, MD 12/27/19 1517

## 2019-12-27 NOTE — ED Notes (Signed)
Provider Wieting at bedside.

## 2019-12-27 NOTE — ED Notes (Signed)
Provider Wieting notified via secure chat of critical trop 33.

## 2019-12-27 NOTE — ED Notes (Signed)
Lab called with K+ of 6.9, This RN spoke with EDP Roxan Hockey, per MD pt needs a room. This RN called charge RN and told her that pt needs a bed. Pt taken to [facility]

## 2019-12-27 NOTE — H&P (Signed)
[facility] [place] [facility]   PATIENT NAME: [name]    MR#:  [id number]  DATE OF BIRTH:  13-Jan-1951  DATE OF ADMISSION:  12/27/2019  PRIMARY CARE PHYSICIAN: [name], DO   REQUESTING/REFERRING PHYSICIAN:   CHIEF COMPLAINT:   Chief Complaint  Patient presents with  . Chest Pain    HISTORY OF PRESENT ILLNESS:  [name]  is a 69 y.o. male with a known history of coming into the hospital with chest pain described as a toothache in the center of his chest radiating to his left arm neck and throat.  He could not even get across the yard without feeling it.  Feels very weak.  Some pain in his calves with walking.  2 to 3 weeks ago he had a prostate biopsy and is on been on antibiotics since.  He took Bactrim for about a week and a half but was having nausea vomiting and was switched over to amoxicillin but only took a few pills.  In the ER he was found to have an elevated creatinine and a potassium of 6.9 and peaked T waves on EKG.  First high-sensitivity troponin 4.  Hospitalist services contacted for admission.  PAST MEDICAL HISTORY:   Past Medical History:  Diagnosis Date  . Allergy   . Chronic headaches   . Diabetes mellitus without complication (HCC)   . GERD (gastroesophageal reflux disease)   . Heart burn   . Hyperlipidemia   . Hypertension     PAST SURGICAL HISTORY:   Past Surgical History:  Procedure Laterality Date  . KNEE SURGERY    . SHOULDER SURGERY    . TONSILLECTOMY      SOCIAL HISTORY:   Social History   Tobacco Use  . Smoking status: Former Smoker    Types: Cigars    Quit date: 2000    Years since quitting: 21.7  . Smokeless tobacco: Current User    Types: Chew  . Tobacco comment: wife states patient never smoked cigarettes, just chewing tobacco  Substance Use Topics  . Alcohol use: Not Currently    Alcohol/week: 0.0 standard drinks    FAMILY HISTORY:   Family History  Problem Relation Age of Onset   . Cancer Father        lung  . Heart disease Sister   . Heart disease Brother   . Prostate cancer Neg Hx     DRUG ALLERGIES:   Allergies  Allergen Reactions  . Crestor [Rosuvastatin Calcium] Other (See Comments)    myalgia  . Invokana [Canagliflozin] Other (See Comments)    dehydrated  . Prednisone Other (See Comments)    REVIEW OF SYSTEMS:  CONSTITUTIONAL: No fever.  Positive for chills after given some IV medications.  Positive for weakness. EYES: No blurred or double vision.  EARS, NOSE, AND THROAT: No tinnitus or ear pain. No sore throat.  Decreased hearing. RESPIRATORY: No cough, shortness of breath, wheezing or hemoptysis.  CARDIOVASCULAR: Positive for chest pain.  GASTROINTESTINAL: Positive for nausea, vomiting and abdominal pain.  No diarrhea. No blood in bowel movements GENITOURINARY: No dysuria, hematuria.  ENDOCRINE: No polyuria, nocturia,  HEMATOLOGY: No anemia, easy bruising or bleeding SKIN: No rash or lesion. MUSCULOSKELETAL: Some leg pain NEUROLOGIC: No tingling, numbness, weakness.  PSYCHIATRY: No anxiety or depression.   MEDICATIONS AT HOME:   Prior to Admission medications   Medication Sig Start Date End Date Taking? Authorizing Provider  ACCU-CHEK SMARTVIEW test strip USE 1 STRIP TO  CHECK GLUCOSE TWICE DAILY 07/04/19   Althea Charon Netta Neat, DO  ACCU-CHEK SMARTVIEW test strip Check sugar 2 times daily 07/03/19   [name], DO  acetaminophen (TYLENOL) 500 MG tablet Take 1,000 mg by mouth every 8 (eight) hours as needed.     [provider]  allopurinol (ZYLOPRIM) 100 MG tablet TAKE 1 TABLET AT BEDTIME 10/25/19   Karamalegos, Alexander J, DO  amLODipine (NORVASC) 2.5 MG tablet TAKE 1 TABLET AT BEDTIME 10/25/19   Karamalegos, Alexander J, DO  amoxicillin-clavulanate (AUGMENTIN) 875-125 MG tablet Take 1 tablet by mouth 2 (two) times daily for 10 days. 12/25/19 01/04/20  McGowan, Wellington Hampshire, PA-C  BYDUREON BCISE 2 MG/0.85ML AUIJ INJECT 2  MG SUBCUTANEOUSLY ONCE A WEEK 06/11/19   Karamalegos, Netta Neat, DO  calcium carbonate (TUMS - DOSED IN MG ELEMENTAL CALCIUM) 500 MG chewable tablet Chew 2 tablets by mouth at bedtime as needed for indigestion or heartburn.     [provider]  colchicine 0.6 MG tablet Take 1 tablet (0.6 mg total) by mouth as needed (gout flare). 04/04/17   Karamalegos, Netta Neat, DO  docusate sodium (COLACE) 100 MG capsule Take 100 mg by mouth 2 (two) times daily.     [provider]  fluticasone (FLONASE) 50 MCG/ACT nasal spray USE 2 SPRAYS IN EACH NOSTRIL ONCE DAILY USE FOR 4 TO 6 WEEKS, THEN STOP AND USE SEASONALLY OR AS NEEDED. 11/27/19   Althea Charon, Netta Neat, DO  Insulin Glargine ([name] KWIKPEN) 100 UNIT/ML Inject 19 Units into the skin at bedtime.    [provider]  Insulin Pen Needle (NOVOFINE PLUS) 32G X 4 MM MISC Use with Lantus insulin daily injection as instructed 06/19/17   Althea Charon, Netta Neat, DO  lisinopril (ZESTRIL) 40 MG tablet TAKE 1 TABLET EVERY DAY 10/25/19   Althea Charon, Netta Neat, DO  metFORMIN (GLUCOPHAGE-XR) 750 MG 24 hr tablet TAKE 2 TABLETS EVERY DAY WITH BREAKFAST 10/25/19   Karamalegos, Netta Neat, DO  omeprazole (PRILOSEC) 20 MG capsule TAKE 1 CAPSULE EVERY DAY 10/25/19   Karamalegos, Netta Neat, DO      VITAL SIGNS:  Blood pressure 121/72, pulse 97, temperature 97.6 F (36.4 C), temperature source Oral, resp. rate 16, height 5\' 11"  (1.803 m), weight 106.1 kg, SpO2 99 %.  PHYSICAL EXAMINATION:  GENERAL:  69 y.o.-year-old patient lying in the bed with no acute distress.  EYES: Pupils equal, round, reactive to light and accommodation. No scleral icterus. Extraocular muscles intact.  HEENT: Head atraumatic, normocephalic. Oropharynx and nasopharynx clear.  NECK:  Supple, no jugular venous distention. No thyroid enlargement, no tenderness.  LUNGS: Normal breath sounds bilaterally, no wheezing, rales,rhonchi or crepitation. No use of accessory  muscles of respiration.  CARDIOVASCULAR: S1, S2 normal. No murmurs, rubs, or gallops.  ABDOMEN: Soft, nontender, nondistended. Bowel sounds present. No organomegaly or mass.  EXTREMITIES: No pedal edema, cyanosis, or clubbing.  NEUROLOGIC: Cranial nerves II through XII are intact. Muscle strength 5/5 in all extremities. Sensation intact. Gait not checked.  PSYCHIATRIC: The patient is alert and oriented x 3.  SKIN: No rash, lesion, or ulcer.   LABORATORY PANEL:   CBC Recent Labs  Lab 12/27/19 1329  WBC 7.0  HGB 13.8  HCT 43.0  PLT 430*   ------------------------------------------------------------------------------------------------------------------  Chemistries  Recent Labs  Lab 12/27/19 1329  NA 133*  K 6.9*  CL 109  CO2 15*  GLUCOSE 182*  BUN 39*  CREATININE 2.70*  CALCIUM 10.2   ------------------------------------------------------------------------------------------------------------------ High-sensitivity troponin 4.  RADIOLOGY:  DG Chest 2 View  Result Date: 12/27/2019 CLINICAL DATA:  Chest pain left arm pain EXAM: CHEST - 2 VIEW COMPARISON:  02/05/2019 FINDINGS: The heart size and mediastinal contours are within normal limits. Both lungs are clear. The visualized skeletal structures are unremarkable. IMPRESSION: No active cardiopulmonary disease. Electronically Signed   By: Marlan Palau M.D.   On: 12/27/2019 14:10    EKG:   Normal sinus rhythm 94 bpm.  Peaked T waves.  IMPRESSION AND PLAN:   1.  Severe hyperkalemia secondary to Bactrim and acute on chronic kidney disease.  Patient given IV calcium, bicarb, insulin and D50.  Lokelma ordered.  Bicarb drip ordered.  Serial BMPs.  Nephrology consulted from emergency room. 2.  Acute kidney injury on chronic kidney disease stage IIIa.  Sodium bicarb hydration.  Hold Glucophage and lisinopril. 3.  Type 2 diabetes mellitus with chronic kidney disease stage IIIa.  On [name] insulin and sliding scale.  Hold  Metformin with acute kidney injury. 4.  Essential hypertension on Norvasc 5.  History of gout on renally dosed allopurinol 6.  Nausea vomiting.  Could be secondary to  All the records are reviewed and case discussed with ED provider. Management plans discussed with the patient, family and they are in agreement.  Patient meets admission criteria secondary to severe hyperkalemia and acute on chronic kidney disease.  CODE STATUS: full code  TOTAL TIME TAKING CARE OF THIS PATIENT: 50 minutes.    Alford Highland M.D on 12/27/2019 at 3:37 PM  Between 7am to 6pm - Pager - [phone]  After 6pm call admission pager [phone]  [facility]  CC: Primary care physician; [name], DO

## 2019-12-27 NOTE — Progress Notes (Signed)
ANTICOAGULATION CONSULT NOTE - Initial Consult  Pharmacy Consult for Heparin drip Indication: chest pain/ACS  Allergies  Allergen Reactions   Crestor [Rosuvastatin Calcium] Other (See Comments)    myalgia   Invokana [Canagliflozin] Other (See Comments)    dehydrated   Prednisone Other (See Comments)    Patient Measurements: Height: 5\' 11"  (180.3 cm) Weight: 106.1 kg (234 lb) IBW/kg (Calculated) : 75.3 Heparin Dosing Weight: 97.7 kg  Vital Signs: Temp: 97.6 F (36.4 C) (10/01 1326) Temp Source: Oral (10/01 1326) BP: 139/72 (10/01 1741) Pulse Rate: 89 (10/01 1741)  Labs: Recent Labs    12/27/19 1329 12/27/19 1543  HGB 13.8  --   HCT 43.0  --   PLT 430*  --   CREATININE 2.70*  --   CKTOTAL 160  --   TROPONINIHS 4 33*    Estimated Creatinine Clearance: 32.4 mL/min (A) (by C-G formula based on SCr of 2.7 mg/dL (H)).   Medical History: Past Medical History:  Diagnosis Date   Allergy    Chronic headaches    Diabetes mellitus without complication (HCC)    GERD (gastroesophageal reflux disease)    Heart burn    Hyperlipidemia    Hypertension     Medications:  Scheduled:   allopurinol  100 mg Oral QHS   aspirin EC  81 mg Oral Daily   docusate sodium  100 mg Oral BID   insulin aspart  0-5 Units Subcutaneous QHS   insulin aspart  0-9 Units Subcutaneous TID WC   insulin glargine  6 Units Subcutaneous QHS   metoprolol tartrate  12.5 mg Oral BID   pantoprazole  40 mg Oral Daily   Infusions:   cefTRIAXone (ROCEPHIN)  IV 1 g (12/27/19 1718)   heparin 1,300 Units/hr (12/27/19 1739)    sodium bicarbonate (isotonic) infusion in sterile water 125 mL/hr at 12/27/19 1537    Assessment: 69 yo M to start Heparin drip for ACS/STEMI. Hgb 13.8  Plt 430  INR pending aPTT pending. No Anticoagulant PTA per Med Rec  Goal of Therapy:  Heparin level 0.3-0.7 units/ml Monitor platelets by anticoagulation protocol: Yes   Plan:  Give 4000 units bolus x  1 Start heparin infusion at 1300 units/hr Check anti-Xa level in 6 hours and daily while on heparin Continue to monitor H&H and platelets  Aadon Gorelik A 12/27/2019,5:47 PM

## 2019-12-28 DIAGNOSIS — N179 Acute kidney failure, unspecified: Secondary | ICD-10-CM

## 2019-12-28 DIAGNOSIS — N390 Urinary tract infection, site not specified: Secondary | ICD-10-CM

## 2019-12-28 DIAGNOSIS — I214 Non-ST elevation (NSTEMI) myocardial infarction: Secondary | ICD-10-CM | POA: Diagnosis present

## 2019-12-28 DIAGNOSIS — I252 Old myocardial infarction: Secondary | ICD-10-CM | POA: Diagnosis present

## 2019-12-28 DIAGNOSIS — B9620 Unspecified Escherichia coli [E. coli] as the cause of diseases classified elsewhere: Secondary | ICD-10-CM

## 2019-12-28 DIAGNOSIS — I10 Essential (primary) hypertension: Secondary | ICD-10-CM

## 2019-12-28 DIAGNOSIS — E1122 Type 2 diabetes mellitus with diabetic chronic kidney disease: Secondary | ICD-10-CM

## 2019-12-28 DIAGNOSIS — N183 Chronic kidney disease, stage 3 unspecified: Secondary | ICD-10-CM

## 2019-12-28 DIAGNOSIS — Z720 Tobacco use: Secondary | ICD-10-CM

## 2019-12-28 DIAGNOSIS — E785 Hyperlipidemia, unspecified: Secondary | ICD-10-CM

## 2019-12-28 LAB — GLUCOSE, CAPILLARY
Glucose-Capillary: 188 mg/dL — ABNORMAL HIGH (ref 70–99)
Glucose-Capillary: 151 mg/dL — ABNORMAL HIGH (ref 70–99)
Glucose-Capillary: 157 mg/dL — ABNORMAL HIGH (ref 70–99)
Glucose-Capillary: 218 mg/dL — ABNORMAL HIGH (ref 70–99)

## 2019-12-28 LAB — PROTIME-INR
INR: 1 (ref 0.8–1.2)
Prothrombin Time: 13.2 seconds (ref 11.4–15.2)

## 2019-12-28 LAB — BASIC METABOLIC PANEL
Anion gap: 8 (ref 5–15)
BUN: 36 mg/dL — ABNORMAL HIGH (ref 8–23)
CO2: 23 mmol/L (ref 22–32)
Calcium: 9 mg/dL (ref 8.9–10.3)
Chloride: 105 mmol/L (ref 98–111)
Creatinine, Ser: 2.26 mg/dL — ABNORMAL HIGH (ref 0.61–1.24)
GFR calc Af Amer: 33 mL/min — ABNORMAL LOW (ref >60)
GFR calc non Af Amer: 29 mL/min — ABNORMAL LOW (ref >60)
Glucose, Bld: 131 mg/dL — ABNORMAL HIGH (ref 70–99)
Potassium: 5.1 mmol/L (ref 3.5–5.1)
Sodium: 136 mmol/L (ref 135–145)

## 2019-12-28 LAB — CBC
HCT: 32.3 % — ABNORMAL LOW (ref 39.0–52.0)
Hemoglobin: 11 g/dL — ABNORMAL LOW (ref 13.0–17.0)
MCH: 29.6 pg (ref 26.0–34.0)
MCHC: 34.1 g/dL (ref 30.0–36.0)
MCV: 87.1 fL (ref 80.0–100.0)
Platelets: 288 10*3/uL (ref 150–400)
RBC: 3.71 MIL/uL — ABNORMAL LOW (ref 4.22–5.81)
RDW: 14.4 % (ref 11.5–15.5)
WBC: 5.3 10*3/uL (ref 4.0–10.5)
nRBC: 0 % (ref 0.0–0.2)

## 2019-12-28 LAB — HEMOGLOBIN A1C
Hgb A1c MFr Bld: 8.7 % — ABNORMAL HIGH (ref 4.8–5.6)
Mean Plasma Glucose: 202.99 mg/dL

## 2019-12-28 LAB — HEPARIN LEVEL (UNFRACTIONATED)
Heparin Unfractionated: 0.75 IU/mL — ABNORMAL HIGH (ref 0.30–0.70)
Heparin Unfractionated: 0.49 IU/mL (ref 0.30–0.70)
Heparin Unfractionated: 0.53 IU/mL (ref 0.30–0.70)

## 2019-12-28 LAB — APTT: aPTT: 96 seconds — ABNORMAL HIGH (ref 24–36)

## 2019-12-28 LAB — TROPONIN I (HIGH SENSITIVITY): Troponin I (High Sensitivity): 4164 ng/L (ref <18)

## 2019-12-28 MED ORDER — SODIUM ZIRCONIUM CYCLOSILICATE 10 G PO PACK
10.0000 g | PACK | Freq: Every day | ORAL | Status: DC
  Administered 2019-12-28: 10 g via ORAL
  Filled 2019-12-28 (×2): qty 1

## 2019-12-28 MED ORDER — EZETIMIBE 10 MG PO TABS
10.0000 mg | ORAL_TABLET | Freq: Every day | ORAL | Status: DC
  Administered 2019-12-28 – 2019-12-31 (×4): 10 mg via ORAL
  Filled 2019-12-28 (×4): qty 1

## 2019-12-28 MED ORDER — NITROGLYCERIN 0.4 MG SL SUBL
0.4000 mg | SUBLINGUAL_TABLET | SUBLINGUAL | Status: AC | PRN
  Administered 2019-12-29 (×3): 0.4 mg via SUBLINGUAL
  Filled 2019-12-28: qty 1

## 2019-12-28 MED ORDER — SODIUM CHLORIDE 0.9 % IV SOLN: INTRAVENOUS | Status: DC

## 2019-12-28 MED ORDER — ATORVASTATIN CALCIUM 10 MG PO TABS
10.0000 mg | ORAL_TABLET | Freq: Every day | ORAL | Status: DC
  Administered 2019-12-28 – 2019-12-29 (×2): 10 mg via ORAL
  Filled 2019-12-28 (×3): qty 1

## 2019-12-28 NOTE — Progress Notes (Signed)
Patient ID: [name], male   DOB: 12/04/1950, 69 y.o.   MRN: [id number] [facility] PROGRESS NOTE  [name] XNT:[id number] DOB: 1950/08/29 DOA: 12/27/2019 PCP: [name], DO  HPI/Subjective: Patient feeling better today than yesterday.  No complaints of chest pain.  When he stood up no pains in his legs.  Admitted yesterday with acute kidney injury and severe hyperkalemia.  Objective: Vitals:   12/28/19 1100 12/28/19 1200  BP: 125/62 117/63  Pulse: 81 80  Resp: 15 17  Temp:    SpO2: 96% 95%    Intake/Output Summary (Last 24 hours) at 12/28/2019 1315 Last data filed at 12/28/2019 0800 Gross per 24 hour  Intake --  Output 450 ml  Net -450 ml   Filed Weights   12/27/19 1326  Weight: 106.1 kg    ROS: Review of Systems  Respiratory: Negative for cough and shortness of breath.   Cardiovascular: Negative for chest pain.  Gastrointestinal: Negative for abdominal pain, nausea and vomiting.   Exam: Physical Exam HENT:     Head: Normocephalic.     Mouth/Throat:     Pharynx: No oropharyngeal exudate.  Eyes:     General: Lids are normal.     Conjunctiva/sclera: Conjunctivae normal.     Pupils: Pupils are equal, round, and reactive to light.  Cardiovascular:     Rate and Rhythm: Normal rate and regular rhythm.     Heart sounds: Normal heart sounds, S1 normal and S2 normal.  Pulmonary:     Breath sounds: No decreased breath sounds, wheezing, rhonchi or rales.  Abdominal:     Palpations: Abdomen is soft.     Tenderness: There is no abdominal tenderness.  Musculoskeletal:     Right lower leg: No swelling.     Left lower leg: No swelling.  Skin:    General: Skin is warm.     Findings: No rash.  Neurological:     Mental Status: He is alert and oriented to person, place, and time.       Data Reviewed: Basic Metabolic Panel: Recent Labs  Lab 12/27/19 1329 12/28/19 0308  NA 133* 136  K 6.9* 5.1  CL 109 105  CO2 15* 23  GLUCOSE  182* 131*  BUN 39* 36*  CREATININE 2.70* 2.26*  CALCIUM 10.2 9.0   Liver Function Tests: Recent Labs  Lab 12/27/19 1329  AST 25  ALT 19  ALKPHOS 58  BILITOT 0.6  PROT 8.5*  ALBUMIN 4.3    CBC: Recent Labs  Lab 12/27/19 1329 12/28/19 0308  WBC 7.0 5.3  HGB 13.8 11.0*  HCT 43.0 32.3*  MCV 90.5 87.1  PLT 430* 288   Cardiac Enzymes: Recent Labs  Lab 12/27/19 1329  CKTOTAL 160    CBG: Recent Labs  Lab 12/27/19 2333 12/28/19 0837 12/28/19 1232  GLUCAP 132* 188* 151*    Recent Results (from the past 240 hour(s))  Respiratory Panel by RT PCR (Flu A&B, Covid) - Nasopharyngeal Swab     Status: None   Collection Time: 12/27/19  3:44 PM   Specimen: Nasopharyngeal Swab  Result Value Ref Range Status   SARS Coronavirus 2 by RT PCR NEGATIVE NEGATIVE Final    Comment: (NOTE) SARS-CoV-2 target nucleic acids are NOT DETECTED.  The SARS-CoV-2 RNA is generally detectable in upper respiratoy specimens during the acute phase of infection. The lowest concentration of SARS-CoV-2 viral copies this assay can detect is 131 copies/mL. A negative result does not preclude SARS-Cov-2 infection  and should not be used as the sole basis for treatment or other patient management decisions. A negative result may occur with  improper specimen collection/handling, submission of specimen other than nasopharyngeal swab, presence of viral mutation(s) within the areas targeted by this assay, and inadequate number of viral copies (<131 copies/mL). A negative result must be combined with clinical observations, patient history, and epidemiological information. The expected result is Negative.  Fact Sheet for Patients:  https://www.moore.com/  Fact Sheet for Healthcare Providers:  https://www.young.biz/  This test is no t yet approved or cleared by the [place] FDA and  has been authorized for detection and/or diagnosis of SARS-CoV-2 by FDA under  an Emergency Use Authorization (EUA). This EUA will remain  in effect (meaning this test can be used) for the duration of the COVID-19 declaration under Section 564(b)(1) of the Act, 21 U.S.C. section 360bbb-3(b)(1), unless the authorization is terminated or revoked sooner.     Influenza A by PCR NEGATIVE NEGATIVE Final   Influenza B by PCR NEGATIVE NEGATIVE Final    Comment: (NOTE) The Xpert Xpress SARS-CoV-2/FLU/RSV assay is intended as an aid in  the diagnosis of influenza from Nasopharyngeal swab specimens and  should not be used as a sole basis for treatment. Nasal washings and  aspirates are unacceptable for Xpert Xpress SARS-CoV-2/FLU/RSV  testing.  Fact Sheet for Patients: https://www.moore.com/  Fact Sheet for Healthcare Providers: https://www.young.biz/  This test is not yet approved or cleared by the [place] FDA and  has been authorized for detection and/or diagnosis of SARS-CoV-2 by  FDA under an Emergency Use Authorization (EUA). This EUA will remain  in effect (meaning this test can be used) for the duration of the  Covid-19 declaration under Section 564(b)(1) of the Act, 21  U.S.C. section 360bbb-3(b)(1), unless the authorization is  terminated or revoked. Performed at [facility], [address] [place], [place] [address]      Studies: DG Chest 2 View  Result Date: 12/27/2019 CLINICAL DATA:  Chest pain left arm pain EXAM: CHEST - 2 VIEW COMPARISON:  02/05/2019 FINDINGS: The heart size and mediastinal contours are within normal limits. Both lungs are clear. The visualized skeletal structures are unremarkable. IMPRESSION: No active cardiopulmonary disease. Electronically Signed   By: Marlan Palau M.D.   On: 12/27/2019 14:10   US Venous Img Lower Bilateral (DVT)  Result Date: 12/27/2019 CLINICAL DATA:  Pain EXAM: Bilateral LOWER EXTREMITY VENOUS DOPPLER ULTRASOUND TECHNIQUE: Gray-scale sonography with  compression, as well as color and duplex ultrasound, were performed to evaluate the deep venous system(s) from the level of the common femoral vein through the popliteal and proximal calf veins. COMPARISON:  None. FINDINGS: VENOUS Normal compressibility of the common femoral, superficial femoral, and popliteal veins, as well as the visualized calf veins. Visualized portions of profunda femoral vein and great saphenous vein unremarkable. No filling defects to suggest DVT on grayscale or color Doppler imaging. Doppler waveforms show normal direction of venous flow, normal respiratory plasticity and response to augmentation. Limited views of the contralateral common femoral vein are unremarkable. OTHER None. Limitations: none IMPRESSION: Negative. Electronically Signed   By: Jonna Clark M.D.   On: 12/27/2019 20:17    Scheduled Meds: . allopurinol  100 mg Oral QHS  . aspirin EC  81 mg Oral Daily  . atorvastatin  10 mg Oral Daily  . docusate sodium  100 mg Oral BID  . ezetimibe  10 mg Oral Daily  . insulin aspart  0-5  Units Subcutaneous QHS  . insulin aspart  0-9 Units Subcutaneous TID WC  . insulin glargine  6 Units Subcutaneous QHS  . metoprolol tartrate  12.5 mg Oral BID  . pantoprazole  40 mg Oral Daily  . sodium zirconium cyclosilicate  10 g Oral Daily   Continuous Infusions: . cefTRIAXone (ROCEPHIN)  IV Stopped (12/27/19 1754)  . heparin 1,250 Units/hr (12/28/19 0526)  .  sodium bicarbonate (isotonic) infusion in sterile water 125 mL/hr at 12/28/19 1239    Assessment/Plan:  1. Acute myocardial infarction.  Continue heparin drip, aspirin, beta-blocker, atorvastatin.  Check lipid profile tomorrow.  Would like to get kidney function better prior to cardiac catheterization on Monday. 2. Severe hyperkalemia on presentation secondary to Bactrim and acute on chronic kidney disease.  Potassium was 6.7 yesterday and down to 5.1 today.  We will give another dose of Lokelma.  Check BMP  tomorrow. 3. Acute kidney injury on chronic kidney disease stage IIIa.  Continue sodium bicarb hydration.  Hold Glucophage and lisinopril. 4. Type 2 diabetes mellitus with chronic kidney disease stage IIIa.  Continue low-dose [name] insulin and sliding scale.  Hold Metformin. 5. UTI after previous prostate biopsy.  Holding Bactrim at this point will give Rocephin. 6. History of gout on renally dosed allopurinol  Code Status:     Code Status Orders  (From admission, onward)         Start     Ordered   12/27/19 1532  Full code  Continuous        12/27/19 1532        Code Status History    Date Active Date Inactive Code Status Order ID Comments User Context   12/06/2016 0105 12/06/2016 2127 Full Code [id number]  Oralia Manis, MD Inpatient   Advance Care Planning Activity     Family Communication: Spoke with wife at the bedside Disposition Plan: Status is: Inpatient  Dispo: The patient is from: Home              Anticipated d/c is to: Home              Anticipated d/c date is: 12/31/2019              Patient currently now being treated for acute myocardial infarction and acute kidney injury on chronic kidney disease stage III  Consultants:  Nephrology  Cardiology  Antibiotics:  Rocephin  Time spent: 28 minutes.  Case discussed with nephrology and cardiology.  Liston Thum [employer]  [facility]

## 2019-12-28 NOTE — Progress Notes (Signed)
ANTICOAGULATION CONSULT NOTE -   Pharmacy Consult for Heparin drip Indication: chest pain/ACS  Allergies  Allergen Reactions  . Crestor [Rosuvastatin Calcium] Other (See Comments)    myalgia  . Invokana [Canagliflozin] Other (See Comments)    dehydrated  . Prednisone Other (See Comments)    Patient Measurements: Height: 5\' 11"  (180.3 cm) Weight: 106.1 kg (234 lb) IBW/kg (Calculated) : 75.3 Heparin Dosing Weight: 97.7 kg  Vital Signs: Temp: 97.9 F (36.6 C) (10/02 0800) Temp Source: Oral (10/02 0800) BP: 117/63 (10/02 1200) Pulse Rate: 80 (10/02 1200)  Labs: Recent Labs    12/27/19 1329 12/27/19 1543 12/28/19 0243 12/28/19 0308 12/28/19 0814 12/28/19 1126  HGB 13.8  --   --  11.0*  --   --   HCT 43.0  --   --  32.3*  --   --   PLT 430*  --   --  288  --   --   APTT  --   --  96*  --   --   --   LABPROT  --   --  13.2  --   --   --   INR  --   --  1.0  --   --   --   HEPARINUNFRC  --   --  0.75*  --   --  0.49  CREATININE 2.70*  --   --  2.26*  --   --   CKTOTAL 160  --   --   --   --   --   TROPONINIHS 4 33*  --   --  4,164*  --     Estimated Creatinine Clearance: 38.8 mL/min (A) (by C-G formula based on SCr of 2.26 mg/dL (H)).   Medical History: Past Medical History:  Diagnosis Date  . Allergy   . Chronic headaches   . Diabetes mellitus without complication (HCC)   . GERD (gastroesophageal reflux disease)   . Heart burn   . Hyperlipidemia   . Hypertension     Medications:  Scheduled:  . allopurinol  100 mg Oral QHS  . aspirin EC  81 mg Oral Daily  . atorvastatin  10 mg Oral Daily  . docusate sodium  100 mg Oral BID  . ezetimibe  10 mg Oral Daily  . insulin aspart  0-5 Units Subcutaneous QHS  . insulin aspart  0-9 Units Subcutaneous TID WC  . insulin glargine  6 Units Subcutaneous QHS  . metoprolol tartrate  12.5 mg Oral BID  . pantoprazole  40 mg Oral Daily  . sodium zirconium cyclosilicate  10 g Oral Daily   Infusions:  . cefTRIAXone  (ROCEPHIN)  IV Stopped (12/27/19 1754)  . heparin 1,250 Units/hr (12/28/19 0526)  .  sodium bicarbonate (isotonic) infusion in sterile water 125 mL/hr at 12/28/19 1239    Assessment: 69 yo M to start Heparin drip for ACS/STEMI. Hgb 13.8  Plt 430  INR pending aPTT pending. No Anticoagulant PTA per Med Rec  Goal of Therapy:  Heparin level 0.3-0.7 units/ml Monitor platelets by anticoagulation protocol: Yes   1002 @ 0243 HL 0.75, slightly SUPRAtherapeutic.  CBC worse, likely dilutional.  Will decrease Heparin infusion to 1250 units/hr and recheck HL in 6 hours.   Plan:  Give 4000 units bolus x 1 Start heparin infusion at 1300 units/hr Check anti-Xa level in 6 hours and daily while on heparin Continue to monitor H&H and platelets   1002 @ 1126 HL 0.49, therapeutic.  Will continue Heparin infusion 1250 units/hr and check confirmatory HL in 6 hours. Continue to monitor CBC and s/sx bleeding complications  Nethan Caudillo A 12/28/2019,1:31 PM

## 2019-12-28 NOTE — Progress Notes (Signed)
ANTICOAGULATION CONSULT NOTE -   Pharmacy Consult for Heparin drip Indication: chest pain/ACS  Allergies  Allergen Reactions  . Crestor [Rosuvastatin Calcium] Other (See Comments)    myalgia  . Invokana [Canagliflozin] Other (See Comments)    dehydrated  . Prednisone Other (See Comments)    Patient Measurements: Height: 5\' 11"  (180.3 cm) Weight: 106.1 kg (234 lb) IBW/kg (Calculated) : 75.3 Heparin Dosing Weight: 97.7 kg  Vital Signs: Temp: 97.9 F (36.6 C) (10/02 0800) Temp Source: Oral (10/02 0800) BP: 125/63 (10/02 1800) Pulse Rate: 81 (10/02 1600)  Labs: Recent Labs    12/27/19 1329 12/27/19 1543 12/28/19 0243 12/28/19 0308 12/28/19 0814 12/28/19 1126 12/28/19 1851  HGB 13.8  --   --  11.0*  --   --   --   HCT 43.0  --   --  32.3*  --   --   --   PLT 430*  --   --  288  --   --   --   APTT  --   --  96*  --   --   --   --   LABPROT  --   --  13.2  --   --   --   --   INR  --   --  1.0  --   --   --   --   HEPARINUNFRC  --   --  0.75*  --   --  0.49 0.53  CREATININE 2.70*  --   --  2.26*  --   --   --   CKTOTAL 160  --   --   --   --   --   --   TROPONINIHS 4 33*  --   --  4,164*  --   --     Estimated Creatinine Clearance: 38.8 mL/min (A) (by C-G formula based on SCr of 2.26 mg/dL (H)).   Medical History: Past Medical History:  Diagnosis Date  . Allergy   . Chronic headaches   . Diabetes mellitus without complication (HCC)   . GERD (gastroesophageal reflux disease)   . Heart burn   . Hyperlipidemia   . Hypertension     Medications:  Scheduled:  . allopurinol  100 mg Oral QHS  . aspirin EC  81 mg Oral Daily  . atorvastatin  10 mg Oral Daily  . docusate sodium  100 mg Oral BID  . ezetimibe  10 mg Oral Daily  . insulin aspart  0-5 Units Subcutaneous QHS  . insulin aspart  0-9 Units Subcutaneous TID WC  . insulin glargine  6 Units Subcutaneous QHS  . metoprolol tartrate  12.5 mg Oral BID  . pantoprazole  40 mg Oral Daily  . sodium zirconium  cyclosilicate  10 g Oral Daily   Infusions:  . cefTRIAXone (ROCEPHIN)  IV Stopped (12/28/19 1705)  . heparin 1,250 Units/hr (12/28/19 0526)  .  sodium bicarbonate (isotonic) infusion in sterile water 125 mL/hr at 12/28/19 1239    Assessment: 69 yo M to start Heparin drip for ACS/STEMI. Hgb 13.8  Plt 430  INR pending aPTT pending. No Anticoagulant PTA per Med Rec  Goal of Therapy:  Heparin level 0.3-0.7 units/ml Monitor platelets by anticoagulation protocol: Yes   1002 @ 0243 HL 0.75, slightly SUPRAtherapeutic.  CBC worse, likely dilutional.  Will decrease Heparin infusion to 1250 units/hr and recheck HL in 6 hours.  1002 @ 1126 HL 0.49, therapeutic x 1 1002 @ 1851 HL  0.53, therapeutic x 2   Plan:   Will continue Heparin infusion 1250 units/hr  Continue to monitor CBC/HL daily and s/sx bleeding complications  Albina Billet, PharmD, BCPS Clinical Pharmacist 12/28/2019 7:15 PM

## 2019-12-28 NOTE — Progress Notes (Signed)
ANTICOAGULATION CONSULT NOTE -   Pharmacy Consult for Heparin drip Indication: chest pain/ACS  Allergies  Allergen Reactions  . Crestor [Rosuvastatin Calcium] Other (See Comments)    myalgia  . Invokana [Canagliflozin] Other (See Comments)    dehydrated  . Prednisone Other (See Comments)    Patient Measurements: Height: 5\' 11"  (180.3 cm) Weight: 106.1 kg (234 lb) IBW/kg (Calculated) : 75.3 Heparin Dosing Weight: 97.7 kg  Vital Signs: BP: 110/64 (10/02 0430) Pulse Rate: 86 (10/01 2351)  Labs: Recent Labs    12/27/19 1329 12/27/19 1543 12/28/19 0243 12/28/19 0308  HGB 13.8  --   --  11.0*  HCT 43.0  --   --  32.3*  PLT 430*  --   --  288  HEPARINUNFRC  --   --  0.75*  --   CREATININE 2.70*  --   --  2.26*  CKTOTAL 160  --   --   --   TROPONINIHS 4 33*  --   --     Estimated Creatinine Clearance: 38.8 mL/min (A) (by C-G formula based on SCr of 2.26 mg/dL (H)).   Medical History: Past Medical History:  Diagnosis Date  . Allergy   . Chronic headaches   . Diabetes mellitus without complication (HCC)   . GERD (gastroesophageal reflux disease)   . Heart burn   . Hyperlipidemia   . Hypertension     Medications:  Scheduled:  . allopurinol  100 mg Oral QHS  . aspirin EC  81 mg Oral Daily  . docusate sodium  100 mg Oral BID  . insulin aspart  0-5 Units Subcutaneous QHS  . insulin aspart  0-9 Units Subcutaneous TID WC  . insulin glargine  6 Units Subcutaneous QHS  . metoprolol tartrate  12.5 mg Oral BID  . pantoprazole  40 mg Oral Daily   Infusions:  . cefTRIAXone (ROCEPHIN)  IV Stopped (12/27/19 1754)  . heparin 1,300 Units/hr (12/27/19 1739)  .  sodium bicarbonate (isotonic) infusion in sterile water 125 mL/hr at 12/28/19 0123    Assessment: 69 yo M to start Heparin drip for ACS/STEMI. Hgb 13.8  Plt 430  INR pending aPTT pending. No Anticoagulant PTA per Med Rec  Goal of Therapy:  Heparin level 0.3-0.7 units/ml Monitor platelets by anticoagulation  protocol: Yes   Plan:  Give 4000 units bolus x 1 Start heparin infusion at 1300 units/hr Check anti-Xa level in 6 hours and daily while on heparin Continue to monitor H&H and platelets   1002 @ 0243 HL 0.75, slightly SUPRAtherapeutic.  CBC worse, likely dilutional.  Will decrease Heparin infusion to 1250 units/hr and recheck HL in 6 hours. Continue to monitor CBC and s/sx bleeding complications  69 A 12/28/2019,5:20 AM

## 2019-12-28 NOTE — Consult Note (Addendum)
Cardiology Consultation:   Patient ID: [name]; [id number]; 19-May-1950   Admit date: 12/27/2019 Date of Consult: 12/28/2019  Primary Care Provider: [name], DO Primary Cardiologist: Agbor-Etang Primary Electrophysiologist:  None   Patient Profile:   Bartlett Enke is a 69 y.o. male with a hx of prior PE treated with Eliquis, DM2, CKD stage III, HTN, HLD, spinal stenosis, and GERD who is being seen today for the evaluation of chest pain/elevated troponin at the request of Dr. Renae Gloss.  History of Present Illness:   Mr. Kampe was seen by Dr. Azucena Cecil in 01/2019 for ED follow-up of fatigue and shortness of breath.  He had recently been seen in the ED for the above symptoms with work-up being unrevealing including cardiac enzymes x2, and CTA chest being negative for PE.  Symptoms felt similar to prior pulmonary embolism.  There was some noted orthostasis.  He underwent Lexiscan MPI which showed no significant ischemia, normal LVSF, and was overall a low risk study.  Echo was recommended, and remains pending.  He presented to the [facility] ED on 12/27/2019 with after developing severe substernal chest pressure rated an 8/10 that radiated to his neck and down his left arm with associated paraesthesias of the left upper extremity digits.  There was associated SOB, diaphoresis, nausea, and palpitations.  He indicated having recent undergone a prostate biopsy and was placed on Bactrim.  However, with nausea and vomiting this was transitioned to amoxicillin.  He has never had similar pain.  Remote smoking history with ongoing chewing.   Upon arrival vital signs are stable though BP has transiently increased to the 160s systolic and is currently improved.  He is afebrile.  Oxygen saturation 99% on room air.  Initial labs showed a potassium of 6.9 trending to 5.1 status post IV calcium, bicarbonate, insulin and D50, BUN 39, serum creatinine 2.7 with most recent baseline  approximately 1.7, Hgb 13.8, initial high-sensitivity troponin 4 with a delta of 33, Covid negative, AST/ALT, CK normal, A1c 8.7 EKG demonstrated sinus rhythm, left axis deviation, possible prior anterior infarct, poor R wave progression along the precordial leads, and peaked T waves.  Chest x-ray showed no active cardiopulmonary disease.  Lower extremity ultrasound was negative for DVT bilaterally.  He has been placed on a heparin drip.  His chest pain symptoms lasted ~ 12 hours.  Currently, chest pain free.    Past Medical History:  Diagnosis Date   Allergy    Chronic headaches    Diabetes mellitus without complication (HCC)    GERD (gastroesophageal reflux disease)    Heart burn    Hyperlipidemia    Hypertension     Past Surgical History:  Procedure Laterality Date   KNEE SURGERY     SHOULDER SURGERY     TONSILLECTOMY       Home Meds: Prior to Admission medications   Medication Sig Start Date End Date Taking? Authorizing Provider  allopurinol (ZYLOPRIM) 100 MG tablet TAKE 1 TABLET AT BEDTIME Patient taking differently: Take 100 mg by mouth at bedtime.  10/25/19  Yes Karamalegos, Alexander J, DO  amLODipine (NORVASC) 2.5 MG tablet TAKE 1 TABLET AT BEDTIME Patient taking differently: Take 2.5 mg by mouth daily.  10/25/19  Yes Karamalegos, Netta Neat, DO  amoxicillin-clavulanate (AUGMENTIN) 875-125 MG tablet Take 1 tablet by mouth 2 (two) times daily for 10 days. 12/25/19 01/04/20 Yes McGowan, Carollee Herter A, PA-C  docusate sodium (COLACE) 100 MG capsule Take 100 mg by mouth  2 (two) times daily.    Yes [provider]  Insulin Glargine ([name] KWIKPEN) 100 UNIT/ML Inject 19 Units into the skin at bedtime.   Yes [provider]  lisinopril (ZESTRIL) 40 MG tablet TAKE 1 TABLET EVERY DAY Patient taking differently: Take 40 mg by mouth daily.  10/25/19  Yes Karamalegos, Netta Neat, DO  metFORMIN (GLUCOPHAGE-XR) 750 MG 24 hr tablet TAKE 2 TABLETS EVERY DAY WITH  BREAKFAST Patient taking differently: Take 1,500 mg by mouth daily with breakfast.  10/25/19  Yes Karamalegos, Netta Neat, DO  omeprazole (PRILOSEC) 20 MG capsule TAKE 1 CAPSULE EVERY DAY Patient taking differently: Take 20 mg by mouth daily.  10/25/19  Yes Karamalegos, Netta Neat, DO  ACCU-CHEK SMARTVIEW test strip USE 1 STRIP TO CHECK GLUCOSE TWICE DAILY 07/04/19   Althea Charon, Netta Neat, DO  ACCU-CHEK SMARTVIEW test strip Check sugar 2 times daily 07/03/19   [name], DO  acetaminophen (TYLENOL) 500 MG tablet Take 1,000 mg by mouth every 8 (eight) hours as needed.     [provider]  BYDUREON BCISE 2 MG/0.85ML AUIJ INJECT 2 MG SUBCUTANEOUSLY ONCE A WEEK Patient taking differently: Inject 2 mg into the skin once a week.  06/11/19   Karamalegos, Netta Neat, DO  calcium carbonate (TUMS - DOSED IN MG ELEMENTAL CALCIUM) 500 MG chewable tablet Chew 2 tablets by mouth at bedtime as needed for indigestion or heartburn.     [provider]  colchicine 0.6 MG tablet Take 1 tablet (0.6 mg total) by mouth as needed (gout flare). 04/04/17   Karamalegos, Alexander J, DO  fluticasone (FLONASE) 50 MCG/ACT nasal spray USE 2 SPRAYS IN EACH NOSTRIL ONCE DAILY USE FOR 4 TO 6 WEEKS, THEN STOP AND USE SEASONALLY OR AS NEEDED. 11/27/19   Althea Charon, Netta Neat, DO  Insulin Pen Needle (NOVOFINE PLUS) 32G X 4 MM MISC Use with Lantus insulin daily injection as instructed 06/19/17   [name], DO    Inpatient Medications: Scheduled Meds:  allopurinol  100 mg Oral QHS   aspirin EC  81 mg Oral Daily   docusate sodium  100 mg Oral BID   insulin aspart  0-5 Units Subcutaneous QHS   insulin aspart  0-9 Units Subcutaneous TID WC   insulin glargine  6 Units Subcutaneous QHS   metoprolol tartrate  12.5 mg Oral BID   pantoprazole  40 mg Oral Daily   Continuous Infusions:  cefTRIAXone (ROCEPHIN)  IV Stopped (12/27/19 1754)   heparin 1,250 Units/hr (12/28/19 0526)    sodium  bicarbonate (isotonic) infusion in sterile water 125 mL/hr at 12/28/19 0123   PRN Meds: acetaminophen **OR** acetaminophen, calcium carbonate, fluticasone, ondansetron **OR** ondansetron (ZOFRAN) IV  Allergies:   Allergies  Allergen Reactions   Crestor [Rosuvastatin Calcium] Other (See Comments)    myalgia   Invokana [Canagliflozin] Other (See Comments)    dehydrated   Prednisone Other (See Comments)    Social History:   Social History   Socioeconomic History   Marital status: Married    Spouse name: Not on file   Number of children: Not on file   Years of education: Not on file   Highest education level: Not on file  Occupational History   Occupation: retired  Tobacco Use   Smoking status: Former Smoker    Types: Cigars    Quit date: 2000    Years since quitting: 21.7   Smokeless tobacco: Current User    Types: Chew   Tobacco comment:  wife states patient never smoked cigarettes, just chewing tobacco  Vaping Use   Vaping Use: Never used  Substance and Sexual Activity   Alcohol use: Not Currently    Alcohol/week: 0.0 standard drinks   Drug use: No   Sexual activity: Yes    Birth control/protection: Inserts  Other Topics Concern   Not on file  Social History Narrative   Not on file   Social Determinants of Health   Financial Resource Strain:    Difficulty of Paying Living Expenses: Not on file  Food Insecurity:    Worried About Programme researcher, broadcasting/film/video in the Last Year: Not on file   [employer] of Food in the Last Year: Not on file  Transportation Needs:    Lack of Transportation (Medical): Not on file   Lack of Transportation (Non-Medical): Not on file  Physical Activity:    Days of Exercise per Week: Not on file   Minutes of Exercise per Session: Not on file  Stress:    Feeling of Stress : Not on file  Social Connections:    Frequency of Communication with Friends and Family: Not on file   Frequency of Social Gatherings with Friends and Family: Not on file    Attends Religious Services: Not on file   Active Member of Clubs or Organizations: Not on file   Attends Banker Meetings: Not on file   Marital Status: Not on file  Intimate Partner Violence:    Fear of Current or Ex-Partner: Not on file   Emotionally Abused: Not on file   Physically Abused: Not on file   Sexually Abused: Not on file     Family History:   Family History  Problem Relation Age of Onset   Cancer Father        lung   Heart disease Sister    Heart disease Brother    Prostate cancer Neg Hx     ROS:  Review of Systems  Constitutional: Positive for diaphoresis and malaise/fatigue. Negative for chills, fever and weight loss.  HENT: Negative for congestion.   Eyes: Negative for discharge and redness.  Respiratory: Positive for shortness of breath. Negative for cough, sputum production and wheezing.   Cardiovascular: Positive for chest pain and palpitations. Negative for orthopnea, claudication, leg swelling and PND.  Gastrointestinal: Positive for nausea. Negative for abdominal pain, constipation, diarrhea, heartburn and vomiting.  Musculoskeletal: Positive for myalgias. Negative for falls.  Skin: Negative for rash.  Neurological: Positive for weakness. Negative for dizziness, tingling, tremors, sensory change, speech change, focal weakness and loss of consciousness.  Endo/Heme/Allergies: Does not bruise/bleed easily.  Psychiatric/Behavioral: Negative for substance abuse. The patient is not nervous/anxious.   All other systems reviewed and are negative.     Physical Exam/Data:   Vitals:   12/28/19 0300 12/28/19 0330 12/28/19 0400 12/28/19 0430  BP: 102/71 116/64 123/73 110/64  Pulse:      Resp: 16 16 15 15   Temp:      TempSrc:      SpO2:      Weight:      Height:        Intake/Output Summary (Last 24 hours) at 12/28/2019 0721 Last data filed at 12/27/2019 2357 Gross per 24 hour  Intake --  Output 250 ml  Net -250 ml   Filed Weights    12/27/19 1326  Weight: 106.1 kg   Body mass index is 32.64 kg/m.   Physical Exam: General: Well developed, well nourished, in  no acute distress. Head: Normocephalic, atraumatic, sclera non-icteric, no xanthomas, nares without discharge.  Neck: Negative for carotid bruits. JVD not elevated. Lungs: Clear bilaterally to auscultation without wheezes, rales, or rhonchi. Breathing is unlabored. Heart: RRR with S1 S2. I/VI systolic murmur RUSB, no rubs, or gallops appreciated. Abdomen: Soft, non-tender, non-distended with normoactive bowel sounds. No hepatomegaly. No rebound/guarding. No obvious abdominal masses. Msk:  Strength and tone appear normal for age. Extremities: No clubbing or cyanosis. No edema. Distal pedal pulses are 2+ and equal bilaterally. Neuro: Alert and oriented X 3. No facial asymmetry. No focal deficit. Moves all extremities spontaneously. Psych:  Responds to questions appropriately with a normal affect.   EKG:  The EKG was personally reviewed and demonstrates: NSR, 96 bpm, left axis deviation, cannot exclude prior anterior infarct poor R wave progression along the precordial leads, peaked T waves, outside of peak T waves no significant change when compared to prior tracing Telemetry:  Telemetry was personally reviewed and demonstrates: SR with rare PVCs  Weights: Filed Weights   12/27/19 1326  Weight: 106.1 kg    Relevant CV Studies:  2D echo 11/2016: - Procedure narrative: Transthoracic echocardiography. The study    was technically difficult due to chest wall and/or lung    interference.  - Left ventricle: The cavity size was normal. Wall thickness was    increased in a pattern of mild LVH. Left ventricular ejection    fraction is grossly normal but is suboptimally assessed due to    poor acoustic windows. The study is not technically sufficient to    allow evaluation of LV diastolic function.  - Right ventricle: Poorly visualized. Right ventricle is upper     normal to mildly dilated. Contraction is grossly normal in the    parasternal views but is suboptimally characterized due to poor    acoustic windows.  _________  Eugenie BirksLexiscan MPI 01/2019: There was no ST segment deviation noted during stress. No T wave inversion was noted during stress. The study is normal. This is a low risk study. The left ventricular ejection fraction is normal (55-65%).   Laboratory Data:  Chemistry Recent Labs  Lab 12/27/19 1329 12/28/19 0308  NA 133* 136  K 6.9* 5.1  CL 109 105  CO2 15* 23  GLUCOSE 182* 131*  BUN 39* 36*  CREATININE 2.70* 2.26*  CALCIUM 10.2 9.0  GFRNONAA 23* 29*  GFRAA 27* 33*  ANIONGAP 9 8    Recent Labs  Lab 12/27/19 1329  PROT 8.5*  ALBUMIN 4.3  AST 25  ALT 19  ALKPHOS 58  BILITOT 0.6   Hematology Recent Labs  Lab 12/27/19 1329 12/28/19 0308  WBC 7.0 5.3  RBC 4.75 3.71*  HGB 13.8 11.0*  HCT 43.0 32.3*  MCV 90.5 87.1  MCH 29.1 29.6  MCHC 32.1 34.1  RDW 14.4 14.4  PLT 430* 288   Cardiac EnzymesNo results for input(s): TROPONINI in the last 168 hours. No results for input(s): TROPIPOC in the last 168 hours.  BNPNo results for input(s): BNP, PROBNP in the last 168 hours.  DDimer No results for input(s): DDIMER in the last 168 hours.  Radiology/Studies:  DG Chest 2 View  Result Date: 12/27/2019 IMPRESSION: No active cardiopulmonary disease. Electronically Signed   By: Marlan Palauharles  Clark M.D.   On: 12/27/2019 14:10   US Venous Img Lower Bilateral (DVT)  Result Date: 12/27/2019 IMPRESSION: Negative. Electronically Signed   By: Jonna ClarkBindu  Avutu M.D.   On: 12/27/2019 20:17  Assessment and Plan:   1. NSTEMI: -Currently, chest pain free -Initial high-sensitivity troponin of 4 with a delta of 33, trending to 4000 -Continue to cycle until peak -Check echo -Will need LHC 10/4 if renal function allows -ASA -Continue heparin drip  -He is intolerant to Crestor -Add Zetia -Risks and benefits of cardiac catheterization  have been discussed with the patient including risks of bleeding, bruising, infection, kidney damage, stroke, heart attack, urgent need for bypass, injury to a limb, and death. The patient understands these risks and is willing to proceed with the procedure. All questions have been answered and concerns listened to -NPO at midnight on 10/3  2.  Acute on CKD stage III: -Most recent baseline serum creatinine appears to be 1.6-1.7 with his presenting serum creatinine 2.7 currently improved to 2.26 -Continue gentle hydration -Hold PTA lisinopril, colchicine, and metformin -Management per primary service  3.  Hyperkalemia: -Likely in the setting of AKI -Status post IV calcium, bicarb, insulin/D50, Lokelma -Planning for an additional dose of Lokelma this morning -Improving  4.  HTN: -Well-controlled -Continue metoprolol  5.  HLD: -LDL of 139 from 10/2019 -Add Zetia  6. Murmur: -Echo pending     For questions or updates, please contact [facility] Please consult [email or web address] for contact info under Cardiology/STEMI.   Signed, Eula Listen, PA-C [facility] Pager: [phone] 12/28/2019, 7:21 AM

## 2019-12-28 NOTE — Progress Notes (Signed)
[facility]  ROUNDING NOTE   Subjective:  Renal function has improved. Creatinine down to 2.2. Still on a heparin drip. Potassium down to 5.1.   Objective:  Vital signs in last 24 hours:  Temp:  [97.9 F (36.6 C)] 97.9 F (36.6 C) (10/02 0800) Pulse Rate:  [71-89] 78 (10/02 1400) Resp:  [12-26] 20 (10/02 1400) BP: (102-162)/(58-77) 117/58 (10/02 1400) SpO2:  [95 %-100 %] 97 % (10/02 1400)  Weight change:  Filed Weights   12/27/19 1326  Weight: 106.1 kg    Intake/Output: I/O last 3 completed shifts: In: -  Out: 250 [Urine:250]   Intake/Output this shift:  Total I/O In: -  Out: 200 [Urine:200]  Physical Exam: General:  No acute distress  Head:  Normocephalic, atraumatic. Moist oral mucosal membranes  Eyes:  Anicteric  Neck:  Supple  Lungs:   Clear to auscultation, normal effort  Heart:  S1S2 no rubs  Abdomen:   Soft, nontender, bowel sounds present  Extremities:  Trace peripheral edema.  Neurologic:  Awake, alert, following commands  Skin:  No lesions       Basic Metabolic Panel: Recent Labs  Lab 12/27/19 1329 12/28/19 0308  NA 133* 136  K 6.9* 5.1  CL 109 105  CO2 15* 23  GLUCOSE 182* 131*  BUN 39* 36*  CREATININE 2.70* 2.26*  CALCIUM 10.2 9.0    Liver Function Tests: Recent Labs  Lab 12/27/19 1329  AST 25  ALT 19  ALKPHOS 58  BILITOT 0.6  PROT 8.5*  ALBUMIN 4.3   No results for input(s): LIPASE, AMYLASE in the last 168 hours. No results for input(s): AMMONIA in the last 168 hours.  CBC: Recent Labs  Lab 12/27/19 1329 12/28/19 0308  WBC 7.0 5.3  HGB 13.8 11.0*  HCT 43.0 32.3*  MCV 90.5 87.1  PLT 430* 288    Cardiac Enzymes: Recent Labs  Lab 12/27/19 1329  CKTOTAL 160    BNP: Invalid input(s): POCBNP  CBG: Recent Labs  Lab 12/27/19 2333 12/28/19 0837 12/28/19 1232  GLUCAP 132* 188* 151*    Microbiology: Results for orders placed or performed during the hospital encounter of 12/27/19   Respiratory Panel by RT PCR (Flu A&B, Covid) - Nasopharyngeal Swab     Status: None   Collection Time: 12/27/19  3:44 PM   Specimen: Nasopharyngeal Swab  Result Value Ref Range Status   SARS Coronavirus 2 by RT PCR NEGATIVE NEGATIVE Final    Comment: (NOTE) SARS-CoV-2 target nucleic acids are NOT DETECTED.  The SARS-CoV-2 RNA is generally detectable in upper respiratoy specimens during the acute phase of infection. The lowest concentration of SARS-CoV-2 viral copies this assay can detect is 131 copies/mL. A negative result does not preclude SARS-Cov-2 infection and should not be used as the sole basis for treatment or other patient management decisions. A negative result may occur with  improper specimen collection/handling, submission of specimen other than nasopharyngeal swab, presence of viral mutation(s) within the areas targeted by this assay, and inadequate number of viral copies (<131 copies/mL). A negative result must be combined with clinical observations, patient history, and epidemiological information. The expected result is Negative.  Fact Sheet for Patients:  PinkCheek.be  Fact Sheet for Healthcare Providers:  GravelBags.it  This test is no t yet approved or cleared by the [place] FDA and  has been authorized for detection and/or diagnosis of SARS-CoV-2 by FDA under an Emergency Use Authorization (EUA). This EUA will remain  in effect (  meaning this test can be used) for the duration of the COVID-19 declaration under Section 564(b)(1) of the Act, 21 U.S.C. section 360bbb-3(b)(1), unless the authorization is terminated or revoked sooner.     Influenza A by PCR NEGATIVE NEGATIVE Final   Influenza B by PCR NEGATIVE NEGATIVE Final    Comment: (NOTE) The Xpert Xpress SARS-CoV-2/FLU/RSV assay is intended as an aid in  the diagnosis of influenza from Nasopharyngeal swab specimens and  should not be used as  a sole basis for treatment. Nasal washings and  aspirates are unacceptable for Xpert Xpress SARS-CoV-2/FLU/RSV  testing.  Fact Sheet for Patients: PinkCheek.be  Fact Sheet for Healthcare Providers: GravelBags.it  This test is not yet approved or cleared by the [place] FDA and  has been authorized for detection and/or diagnosis of SARS-CoV-2 by  FDA under an Emergency Use Authorization (EUA). This EUA will remain  in effect (meaning this test can be used) for the duration of the  Covid-19 declaration under Section 564(b)(1) of the Act, 21  U.S.C. section 360bbb-3(b)(1), unless the authorization is  terminated or revoked. Performed at [facility], [address] [place], [place] [address]     Coagulation Studies: Recent Labs    12/28/19 0243  LABPROT 13.2  INR 1.0    Urinalysis: No results for input(s): COLORURINE, LABSPEC, PHURINE, GLUCOSEU, HGBUR, BILIRUBINUR, KETONESUR, PROTEINUR, UROBILINOGEN, NITRITE, LEUKOCYTESUR in the last 72 hours.  Invalid input(s): APPERANCEUR    Imaging: DG Chest 2 View  Result Date: 12/27/2019 CLINICAL DATA:  Chest pain left arm pain EXAM: CHEST - 2 VIEW COMPARISON:  02/05/2019 FINDINGS: The heart size and mediastinal contours are within normal limits. Both lungs are clear. The visualized skeletal structures are unremarkable. IMPRESSION: No active cardiopulmonary disease. Electronically Signed   By: Franchot Gallo M.D.   On: 12/27/2019 14:10   US Venous Img Lower Bilateral (DVT)  Result Date: 12/27/2019 CLINICAL DATA:  Pain EXAM: Bilateral LOWER EXTREMITY VENOUS DOPPLER ULTRASOUND TECHNIQUE: Gray-scale sonography with compression, as well as color and duplex ultrasound, were performed to evaluate the deep venous system(s) from the level of the common femoral vein through the popliteal and proximal calf veins. COMPARISON:  None. FINDINGS: VENOUS Normal compressibility of  the common femoral, superficial femoral, and popliteal veins, as well as the visualized calf veins. Visualized portions of profunda femoral vein and great saphenous vein unremarkable. No filling defects to suggest DVT on grayscale or color Doppler imaging. Doppler waveforms show normal direction of venous flow, normal respiratory plasticity and response to augmentation. Limited views of the contralateral common femoral vein are unremarkable. OTHER None. Limitations: none IMPRESSION: Negative. Electronically Signed   By: Prudencio Pair M.D.   On: 12/27/2019 20:17     Medications:   . cefTRIAXone (ROCEPHIN)  IV Stopped (12/27/19 1754)  . heparin 1,250 Units/hr (12/28/19 0526)  .  sodium bicarbonate (isotonic) infusion in sterile water 125 mL/hr at 12/28/19 1239   . allopurinol  100 mg Oral QHS  . aspirin EC  81 mg Oral Daily  . atorvastatin  10 mg Oral Daily  . docusate sodium  100 mg Oral BID  . ezetimibe  10 mg Oral Daily  . insulin aspart  0-5 Units Subcutaneous QHS  . insulin aspart  0-9 Units Subcutaneous TID WC  . insulin glargine  6 Units Subcutaneous QHS  . metoprolol tartrate  12.5 mg Oral BID  . pantoprazole  40 mg Oral Daily  . sodium zirconium cyclosilicate  10 g Oral  Daily   acetaminophen **OR** acetaminophen, calcium carbonate, fluticasone, ondansetron **OR** ondansetron (ZOFRAN) IV  Assessment/ Plan:  69 y.o. male with past medical history of diabetes mellitus type 2, hypertension, chronic kidney disease stage IIIb, history of knee surgery, recurrent E. coli UTI, elevated PSA, prostate biopsy 9/21 admitted with hyperkalemia.  1.  Acute kidney injury/chronic kidney disease stage IIIb baseline creatinine 1.7/EGFR 38 2.  Hyperkalemia, improved. 3.  Diabetes mellitus type 2 with chronic kidney disease. 4.  Dysuria/elevated PSA.  Plan: Renal function significantly improved.  Continue sodium bicarbonate infusion.  Continue to hold Bactrim.  Maintain the patient on ceftriaxone at  this time.  Maintain the patient on [place] for 1 additional day but will reassess for its use tomorrow.  Continue to follow renal parameters and avoid nephrotoxins as possible.   LOS: 1 Christinamarie Tall 10/2/20212:57 PM

## 2019-12-29 ENCOUNTER — Inpatient Hospital Stay (HOSPITAL_COMMUNITY)
Admit: 2019-12-29 | Discharge: 2019-12-29 | Disposition: A | Discharge status: Home / Self Care | Payer: Medicare HMO | Attending: Physician Assistant | Admitting: Physician Assistant

## 2019-12-29 DIAGNOSIS — I214 Non-ST elevation (NSTEMI) myocardial infarction: Secondary | ICD-10-CM

## 2019-12-29 DIAGNOSIS — E782 Mixed hyperlipidemia: Secondary | ICD-10-CM

## 2019-12-29 DIAGNOSIS — R079 Chest pain, unspecified: Secondary | ICD-10-CM

## 2019-12-29 LAB — LIPID PANEL
Cholesterol: 147 mg/dL (ref 0–200)
HDL: 29 mg/dL — ABNORMAL LOW (ref >40)
LDL Cholesterol: 81 mg/dL (ref 0–99)
Total CHOL/HDL Ratio: 5.1 RATIO
Triglycerides: 186 mg/dL — ABNORMAL HIGH (ref <150)
VLDL: 37 mg/dL (ref 0–40)

## 2019-12-29 LAB — GLUCOSE, CAPILLARY
Glucose-Capillary: 98 mg/dL (ref 70–99)
Glucose-Capillary: 126 mg/dL — ABNORMAL HIGH (ref 70–99)
Glucose-Capillary: 183 mg/dL — ABNORMAL HIGH (ref 70–99)
Glucose-Capillary: 220 mg/dL — ABNORMAL HIGH (ref 70–99)

## 2019-12-29 LAB — CBC
HCT: 31.8 % — ABNORMAL LOW (ref 39.0–52.0)
Hemoglobin: 10.7 g/dL — ABNORMAL LOW (ref 13.0–17.0)
MCH: 29.8 pg (ref 26.0–34.0)
MCHC: 33.6 g/dL (ref 30.0–36.0)
MCV: 88.6 fL (ref 80.0–100.0)
Platelets: 305 10*3/uL (ref 150–400)
RBC: 3.59 MIL/uL — ABNORMAL LOW (ref 4.22–5.81)
RDW: 14.1 % (ref 11.5–15.5)
WBC: 6.3 10*3/uL (ref 4.0–10.5)
nRBC: 0 % (ref 0.0–0.2)

## 2019-12-29 LAB — TROPONIN I (HIGH SENSITIVITY)
Troponin I (High Sensitivity): 1582 ng/L (ref <18)
Troponin I (High Sensitivity): 1321 ng/L (ref <18)

## 2019-12-29 LAB — BASIC METABOLIC PANEL
Anion gap: 11 (ref 5–15)
BUN: 32 mg/dL — ABNORMAL HIGH (ref 8–23)
CO2: 27 mmol/L (ref 22–32)
Calcium: 8.8 mg/dL — ABNORMAL LOW (ref 8.9–10.3)
Chloride: 99 mmol/L (ref 98–111)
Creatinine, Ser: 1.88 mg/dL — ABNORMAL HIGH (ref 0.61–1.24)
GFR calc Af Amer: 42 mL/min — ABNORMAL LOW (ref >60)
GFR calc non Af Amer: 36 mL/min — ABNORMAL LOW (ref >60)
Glucose, Bld: 167 mg/dL — ABNORMAL HIGH (ref 70–99)
Potassium: 4.1 mmol/L (ref 3.5–5.1)
Sodium: 137 mmol/L (ref 135–145)

## 2019-12-29 LAB — HEPARIN LEVEL (UNFRACTIONATED): Heparin Unfractionated: 0.58 IU/mL (ref 0.30–0.70)

## 2019-12-29 MED ORDER — SODIUM CHLORIDE 0.9% FLUSH: 3.0000 mL | Freq: Two times a day (BID) | INTRAVENOUS | Status: DC

## 2019-12-29 MED ORDER — MORPHINE SULFATE (PF) 2 MG/ML IV SOLN
1.0000 mg | INTRAVENOUS | Status: AC | PRN
  Administered 2019-12-30 – 2019-12-31 (×2): 1 mg via INTRAVENOUS
  Filled 2019-12-29 (×2): qty 1

## 2019-12-29 MED ORDER — PERFLUTREN LIPID MICROSPHERE
1.0000 mL | INTRAVENOUS | Status: AC | PRN
  Administered 2019-12-29: 4 mL via INTRAVENOUS
  Filled 2019-12-29: qty 10

## 2019-12-29 MED ORDER — SODIUM CHLORIDE 0.9% FLUSH: 3.0000 mL | INTRAVENOUS | Status: DC | PRN

## 2019-12-29 MED ORDER — SODIUM CHLORIDE 0.9 % IV SOLN: 250.0000 mL | INTRAVENOUS | Status: DC | PRN

## 2019-12-29 MED ORDER — NITROGLYCERIN 0.4 MG SL SUBL: 0.4000 mg | SUBLINGUAL_TABLET | SUBLINGUAL | Status: DC | PRN

## 2019-12-29 MED ORDER — SODIUM CHLORIDE 0.9 % IV SOLN: INTRAVENOUS | Status: DC

## 2019-12-29 NOTE — Plan of Care (Signed)
  Problem: Clinical Measurements: Goal: Cardiovascular complication will be avoided Outcome: Progressing   Problem: Activity: Goal: Risk for activity intolerance will decrease Outcome: Progressing   Problem: Pain Managment: Goal: General experience of comfort will improve Outcome: Progressing   Problem: Safety: Goal: Ability to remain free from injury will improve Outcome: Progressing   

## 2019-12-29 NOTE — H&P (View-Only) (Signed)
Progress Note  Patient Name: [name] Date of Encounter: 12/29/2019  [facility] Cardiologist: Debbe Odea, MD   Subjective   Had chest pain yesterday evening, rang call bell, resolved with nitro. No further pain. Patient and his wife had extensive questions, see below. Breathing stable, no other concerns today. He does vaguely remember now having a femoral cath >10 years ago, may have been Dr. Jacinto Halim, he cannot recall. Does not think he had any blockages. He remembers that crestor made him feel uncoordinated. He does not recall if he was actually taking the atorvastatin at home.  Inpatient Medications    Scheduled Meds: . allopurinol  100 mg Oral QHS  . aspirin EC  81 mg Oral Daily  . atorvastatin  10 mg Oral Daily  . docusate sodium  100 mg Oral BID  . ezetimibe  10 mg Oral Daily  . insulin aspart  0-5 Units Subcutaneous QHS  . insulin aspart  0-9 Units Subcutaneous TID WC  . insulin glargine  6 Units Subcutaneous QHS  . metoprolol tartrate  12.5 mg Oral BID  . pantoprazole  40 mg Oral Daily   Continuous Infusions: . sodium chloride Stopped (12/29/19 0500)  . cefTRIAXone (ROCEPHIN)  IV Stopped (12/28/19 1705)  . heparin 1,250 Units/hr (12/29/19 0728)  .  sodium bicarbonate (isotonic) infusion in sterile water 125 mL/hr at 12/28/19 2246   PRN Meds: acetaminophen **OR** acetaminophen, calcium carbonate, fluticasone, morphine injection, nitroGLYCERIN, ondansetron **OR** ondansetron (ZOFRAN) IV   Vital Signs    Vitals:   12/29/19 0020 12/29/19 0600 12/29/19 0734 12/29/19 0810  BP: 125/65 132/72 138/73 139/73  Pulse: 79 71 67 70  Resp:  20 19   Temp:  98.6 F (37 C) 98.5 F (36.9 C)   TempSrc:  Oral Oral   SpO2: 100% 100% 98%   Weight:      Height:        Intake/Output Summary (Last 24 hours) at 12/29/2019 1322 Last data filed at 12/29/2019 0728 Gross per 24 hour  Intake 1682.25 ml  Output 2770 ml  Net -1087.75 ml   Last 3 Weights 12/27/2019  12/10/2019 12/04/2019  Weight (lbs) 234 lb 234 lb 230 lb  Weight (kg) 106.142 kg 106.142 kg 104.327 kg      Telemetry    SR, PVCs - Personally Reviewed  ECG    NSR, no acute changes - Personally Reviewed  Physical Exam   GEN: No acute distress.   Neck: No JVD Cardiac: RRR, no rubs, or gallops. 1/6 systolic murmur Respiratory: Clear to auscultation bilaterally. GI: Soft, nontender, non-distended  MS: No edema; No deformity. Neuro:  Nonfocal  Psych: Normal affect   Labs    High Sensitivity Troponin:   Recent Labs  Lab 12/27/19 1329 12/27/19 1543 12/28/19 0814 12/29/19 0026 12/29/19 0402  TROPONINIHS 4 33* 4,164* 1,582* 1,321*      Chemistry Recent Labs  Lab 12/27/19 1329 12/28/19 0308 12/29/19 0026  NA 133* 136 137  K 6.9* 5.1 4.1  CL 109 105 99  CO2 15* 23 27  GLUCOSE 182* 131* 167*  BUN 39* 36* 32*  CREATININE 2.70* 2.26* 1.88*  CALCIUM 10.2 9.0 8.8*  PROT 8.5*  --   --   ALBUMIN 4.3  --   --   AST 25  --   --   ALT 19  --   --   ALKPHOS 58  --   --   BILITOT 0.6  --   --  GFRNONAA 23* 29* 36*  GFRAA 27* 33* 42*  ANIONGAP 9 8 11      Hematology Recent Labs  Lab 12/27/19 1329 12/28/19 0308 12/29/19 0026  WBC 7.0 5.3 6.3  RBC 4.75 3.71* 3.59*  HGB 13.8 11.0* 10.7*  HCT 43.0 32.3* 31.8*  MCV 90.5 87.1 88.6  MCH 29.1 29.6 29.8  MCHC 32.1 34.1 33.6  RDW 14.4 14.4 14.1  PLT 430* 288 305    BNPNo results for input(s): BNP, PROBNP in the last 168 hours.   DDimer No results for input(s): DDIMER in the last 168 hours.   Radiology    DG Chest 2 View  Result Date: 12/27/2019 CLINICAL DATA:  Chest pain left arm pain EXAM: CHEST - 2 VIEW COMPARISON:  02/05/2019 FINDINGS: The heart size and mediastinal contours are within normal limits. Both lungs are clear. The visualized skeletal structures are unremarkable. IMPRESSION: No active cardiopulmonary disease. Electronically Signed   By: 13/12/2018 M.D.   On: 12/27/2019 14:10   02/26/2020 Venous Img  Lower Bilateral (DVT)  Result Date: 12/27/2019 CLINICAL DATA:  Pain EXAM: Bilateral LOWER EXTREMITY VENOUS DOPPLER ULTRASOUND TECHNIQUE: Gray-scale sonography with compression, as well as color and duplex ultrasound, were performed to evaluate the deep venous system(s) from the level of the common femoral vein through the popliteal and proximal calf veins. COMPARISON:  None. FINDINGS: VENOUS Normal compressibility of the common femoral, superficial femoral, and popliteal veins, as well as the visualized calf veins. Visualized portions of profunda femoral vein and great saphenous vein unremarkable. No filling defects to suggest DVT on grayscale or color Doppler imaging. Doppler waveforms show normal direction of venous flow, normal respiratory plasticity and response to augmentation. Limited views of the contralateral common femoral vein are unremarkable. OTHER None. Limitations: none IMPRESSION: Negative. Electronically Signed   By: 02/26/2020 M.D.   On: 12/27/2019 20:17    Cardiac Studies   Echo pending today  Echo 12/06/2016 - Procedure narrative: Transthoracic echocardiography. The study  was technically difficult due to chest wall and/or lung  interference.  - Left ventricle: The cavity size was normal. Wall thickness was  increased in a pattern of mild LVH. Left ventricular ejection  fraction is grossly normal but is suboptimally assessed due to  poor acoustic windows. The study is not technically sufficient to  allow evaluation of LV diastolic function.  - Right ventricle: Poorly visualized. Right ventricle is upper  normal to mildly dilated. Contraction is grossly normal in the  parasternal views but is suboptimally characterized due to poor  acoustic windows.   Patient Profile     69 y.o. male with a hx of prior PE treated with Eliquis, DM2, CKD stage III, HTN, HLD, spinal stenosis, and GERD who is being followed for the evaluation of chest pain/elevated troponin  at the request of Dr. 73.  Assessment & Plan    NSTEMI: -hsTn trend 4 > 33 >4164 >1321, can stop trending as it has peaked -using chew this admission. Needs tobacco cessation, which I discussed with him today. He will consider -counseled on contacting staff if he has chest pain -continue heparin, aspirin 81 mg -adding Zetia to atorvastatin, intolerant to rosuvastatin, would consider PCSK9i if not at goal -plan for cath 10/4 if renal function allows. He is amenable, see note 10/3. May need to be pushed back based on renal function, will make NPO tonight but I told them this may need to be changed. -echo pending today -continue metoprolol  Type II diabetes:  if CAD found, would consider GLP1RA. Reports dehydration on canagliflozin -A1c 8.7 -insulin per primary team  Acute kidney injury on CKD, chronic CKD3b (GFR 38 previously) with acute hyperkalemia -hold lisinopril, colchicine, metformin -hyperkalemia per nephrology, improved today to 4.1 from 6.9 on admission -monitor -Cr 1.88 today, from 2.7 on admission -remains on sodium bicarbonate. Possible cath timing based on renal function.  Hypertension: -holding lisinopril due to AKI -continue metoprolol  Hyperlipidemia: -reports intolerance to rosuvastatin -we spent significant time today discussing statins, his wife had many questions. Reviewed at length. -per outside notes, was on atorvastatin 10 mg daily, restarted. If tolerates, would increase to high intensity dose. He is unsure if he was actually taking this at home. -started ezetimibe this admission -if cannot get to goal on max tolerated statin and ezetimibe, and CAD found, would consider PCSK9i -lipids: Tchol 147, HDL 29, LDL 81, TG 186 (fasting)  Total time of encounter: 40 minutes total time of encounter, including 30 minutes spent in face-to-face patient care. This time includes coordination of care and counseling regarding workup, cath, kidney function, and  prolonged discussion re: statins. Remainder of non-face-to-face time involved reviewing chart documents/testing relevant to the patient encounter and documentation in the medical record.  Jodelle Red, MD, PhD [facility]   For questions or updates, please contact [facility] Please consult [email or web address] for contact info under        Signed, Jodelle Red, MD  12/29/2019, 1:22 PM

## 2019-12-29 NOTE — Progress Notes (Signed)
Patient ID: [name], male   DOB: 28-Sep-1950, 69 y.o.   MRN: [id number] [facility] PROGRESS NOTE  [name] FTD:[id number] DOB: 01-11-1951 DOA: 12/27/2019 PCP: [name], DO  HPI/Subjective: Patient had an episode of chest pain last night but now feels okay.  No pain in his calves.  No further chest pain now.  No shortness of breath.  Urinating well.  Came in and found to have acute kidney injury and hyperkalemia and then myocardial infarction.  Objective: Vitals:   12/29/19 0734 12/29/19 0810  BP: 138/73 139/73  Pulse: 67 70  Resp: 19   Temp: 98.5 F (36.9 C)   SpO2: 98%     Intake/Output Summary (Last 24 hours) at 12/29/2019 1446 Last data filed at 12/29/2019 0728 Gross per 24 hour  Intake 1682.25 ml  Output 2770 ml  Net -1087.75 ml   Filed Weights   12/27/19 1326  Weight: 106.1 kg    ROS: Review of Systems  Respiratory: Negative for shortness of breath.   Cardiovascular: Positive for chest pain.  Gastrointestinal: Negative for abdominal pain, nausea and vomiting.   Exam: Physical Exam HENT:     Mouth/Throat:     Pharynx: No oropharyngeal exudate.  Eyes:     General: Lids are normal.     Conjunctiva/sclera: Conjunctivae normal.     Pupils: Pupils are equal, round, and reactive to light.  Cardiovascular:     Rate and Rhythm: Normal rate and regular rhythm.     Heart sounds: Normal heart sounds, S1 normal and S2 normal.  Pulmonary:     Breath sounds: No decreased breath sounds, wheezing, rhonchi or rales.  Abdominal:     Palpations: Abdomen is soft.     Tenderness: There is no abdominal tenderness.  Musculoskeletal:     Right ankle: No swelling.     Left ankle: No swelling.  Skin:    General: Skin is warm.     Findings: No rash.  Neurological:     Mental Status: He is alert and oriented to person, place, and time.       Data Reviewed: Basic Metabolic Panel: Recent Labs  Lab 12/27/19 1329 12/28/19 0308  12/29/19 0026  NA 133* 136 137  K 6.9* 5.1 4.1  CL 109 105 99  CO2 15* 23 27  GLUCOSE 182* 131* 167*  BUN 39* 36* 32*  CREATININE 2.70* 2.26* 1.88*  CALCIUM 10.2 9.0 8.8*   Liver Function Tests: Recent Labs  Lab 12/27/19 1329  AST 25  ALT 19  ALKPHOS 58  BILITOT 0.6  PROT 8.5*  ALBUMIN 4.3   CBC: Recent Labs  Lab 12/27/19 1329 12/28/19 0308 12/29/19 0026  WBC 7.0 5.3 6.3  HGB 13.8 11.0* 10.7*  HCT 43.0 32.3* 31.8*  MCV 90.5 87.1 88.6  PLT 430* 288 305   Cardiac Enzymes: Recent Labs  Lab 12/27/19 1329  CKTOTAL 160    CBG: Recent Labs  Lab 12/28/19 1232 12/28/19 1702 12/28/19 2228 12/29/19 0732 12/29/19 1129  GLUCAP 151* 157* 218* 98 126*    Recent Results (from the past 240 hour(s))  Respiratory Panel by RT PCR (Flu A&B, Covid) - Nasopharyngeal Swab     Status: None   Collection Time: 12/27/19  3:44 PM   Specimen: Nasopharyngeal Swab  Result Value Ref Range Status   SARS Coronavirus 2 by RT PCR NEGATIVE NEGATIVE Final    Comment: (NOTE) SARS-CoV-2 target nucleic acids are NOT DETECTED.  The SARS-CoV-2 RNA is generally  detectable in upper respiratoy specimens during the acute phase of infection. The lowest concentration of SARS-CoV-2 viral copies this assay can detect is 131 copies/mL. A negative result does not preclude SARS-Cov-2 infection and should not be used as the sole basis for treatment or other patient management decisions. A negative result may occur with  improper specimen collection/handling, submission of specimen other than nasopharyngeal swab, presence of viral mutation(s) within the areas targeted by this assay, and inadequate number of viral copies (<131 copies/mL). A negative result must be combined with clinical observations, patient history, and epidemiological information. The expected result is Negative.  Fact Sheet for Patients:  https://www.moore.com/  Fact Sheet for Healthcare Providers:   https://www.young.biz/  This test is no t yet approved or cleared by the [place] FDA and  has been authorized for detection and/or diagnosis of SARS-CoV-2 by FDA under an Emergency Use Authorization (EUA). This EUA will remain  in effect (meaning this test can be used) for the duration of the COVID-19 declaration under Section 564(b)(1) of the Act, 21 U.S.C. section 360bbb-3(b)(1), unless the authorization is terminated or revoked sooner.     Influenza A by PCR NEGATIVE NEGATIVE Final   Influenza B by PCR NEGATIVE NEGATIVE Final    Comment: (NOTE) The Xpert Xpress SARS-CoV-2/FLU/RSV assay is intended as an aid in  the diagnosis of influenza from Nasopharyngeal swab specimens and  should not be used as a sole basis for treatment. Nasal washings and  aspirates are unacceptable for Xpert Xpress SARS-CoV-2/FLU/RSV  testing.  Fact Sheet for Patients: https://www.moore.com/  Fact Sheet for Healthcare Providers: https://www.young.biz/  This test is not yet approved or cleared by the [place] FDA and  has been authorized for detection and/or diagnosis of SARS-CoV-2 by  FDA under an Emergency Use Authorization (EUA). This EUA will remain  in effect (meaning this test can be used) for the duration of the  Covid-19 declaration under Section 564(b)(1) of the Act, 21  U.S.C. section 360bbb-3(b)(1), unless the authorization is  terminated or revoked. Performed at [facility], [address] [place], [place] [address]      Studies: US Venous Img Lower Bilateral (DVT)  Result Date: 12/27/2019 CLINICAL DATA:  Pain EXAM: Bilateral LOWER EXTREMITY VENOUS DOPPLER ULTRASOUND TECHNIQUE: Gray-scale sonography with compression, as well as color and duplex ultrasound, were performed to evaluate the deep venous system(s) from the level of the common femoral vein through the popliteal and proximal calf veins. COMPARISON:   None. FINDINGS: VENOUS Normal compressibility of the common femoral, superficial femoral, and popliteal veins, as well as the visualized calf veins. Visualized portions of profunda femoral vein and great saphenous vein unremarkable. No filling defects to suggest DVT on grayscale or color Doppler imaging. Doppler waveforms show normal direction of venous flow, normal respiratory plasticity and response to augmentation. Limited views of the contralateral common femoral vein are unremarkable. OTHER None. Limitations: none IMPRESSION: Negative. Electronically Signed   By: Jonna Clark M.D.   On: 12/27/2019 20:17    Scheduled Meds: . allopurinol  100 mg Oral QHS  . aspirin EC  81 mg Oral Daily  . atorvastatin  10 mg Oral Daily  . docusate sodium  100 mg Oral BID  . ezetimibe  10 mg Oral Daily  . insulin aspart  0-5 Units Subcutaneous QHS  . insulin aspart  0-9 Units Subcutaneous TID WC  . insulin glargine  6 Units Subcutaneous QHS  . metoprolol tartrate  12.5 mg Oral BID  .  pantoprazole  40 mg Oral Daily   Continuous Infusions: . sodium chloride Stopped (12/29/19 0500)  . cefTRIAXone (ROCEPHIN)  IV Stopped (12/28/19 1705)  . heparin 1,250 Units/hr (12/29/19 0728)  .  sodium bicarbonate (isotonic) infusion in sterile water 125 mL/hr at 12/28/19 2246    Assessment/Plan:  1. Acute myocardial infarction.  Continue heparin drip.  On aspirin, beta-blocker atorvastatin and Zetia.  LDL 81 and goal less than 70.  Cardiac catheterization for Monday. 2. Acute kidney injury on chronic kidney disease stage IIIa.  Continue sodium bicarb IV fluid.  Creatinine improved to 1.88 today.  Continue to hold Glucophage and lisinopril. 3. Severe hyperkalemia on presentation secondary to Bactrim and acute on chronic kidney disease.  Potassium in normal range today.  Discontinue Lokelma. 4. Type 2 diabetes mellitus on chronic kidney disease stage IIIa.  Continue low-dose [name] insulin and sliding scale.  Hold  Metformin 5. UTI after previous prostate biopsy.  Urology wanted patient on antibiotics for about a month.  Holding Bactrim at this point.  Continue Rocephin. 6. History of gout on renally dosed allopurinol.    Code Status:     Code Status Orders  (From admission, onward)         Start     Ordered   12/27/19 1532  Full code  Continuous        12/27/19 1532        Code Status History    Date Active Date Inactive Code Status Order ID Comments User Context   12/06/2016 0105 12/06/2016 2127 Full Code [id number]  Oralia Manis, MD Inpatient   Advance Care Planning Activity     Family Communication: Wife at bedside Disposition Plan: Status is: Inpatient  Dispo: The patient is from: Home              Anticipated d/c is to: Home              Anticipated d/c date is: Potential 12/31/2019              Patient currently being followed closely for acute myocardial infarction with medical management.  For cardiac catheterization tomorrow.  Time spent: 27 minutes  Ailyne Pawley [employer]

## 2019-12-29 NOTE — Progress Notes (Signed)
*  PRELIMINARY RESULTS* Echocardiogram 2D Echocardiogram has been performed. Definity IV Contrast used on this study.  [name] [name] 12/29/2019, 1:39 PM

## 2019-12-29 NOTE — Progress Notes (Signed)
Pt was admitted on the floor with no signs of distress. Pt alert x4. VSS. Pt was educated about safety and ascom within reach. Will continue to monitor.

## 2019-12-29 NOTE — Progress Notes (Signed)
[facility]  ROUNDING NOTE   Subjective:  Kidney function continues to improve. Creatinine down to 1.9. Urine output 2.6 L over the preceding 24 hours. Potassium also normalized. Cardiac catheterization being planned for tomorrow, but may need to be pushed out if he does not continue to improve in terms of his renal function.   Objective:  Vital signs in last 24 hours:  Temp:  [98.5 F (36.9 C)-99.6 F (37.6 C)] 98.5 F (36.9 C) (10/03 0734) Pulse Rate:  [67-85] 70 (10/03 0810) Resp:  [18-20] 19 (10/03 0734) BP: (120-150)/(62-73) 139/73 (10/03 0810) SpO2:  [96 %-100 %] 98 % (10/03 0734)  Weight change:  Filed Weights   12/27/19 1326  Weight: 106.1 kg    Intake/Output: I/O last 3 completed shifts: In: 1802.3 [P.O.:477; I.V.:1225.3; IV Piggyback:100] Out: 2920 [Urine:2920]   Intake/Output this shift:  Total I/O In: -  Out: 300 [Urine:300]  Physical Exam: General:  No acute distress  Head:  Normocephalic, atraumatic. Moist oral mucosal membranes  Eyes:  Anicteric  Neck:  Supple  Lungs:   Clear to auscultation, normal effort  Heart:  S1S2 no rubs  Abdomen:   Soft, nontender, bowel sounds present  Extremities:  Trace peripheral edema.  Neurologic:  Awake, alert, following commands  Skin:  No lesions       Basic Metabolic Panel: Recent Labs  Lab 12/27/19 1329 12/28/19 0308 12/29/19 0026  NA 133* 136 137  K 6.9* 5.1 4.1  CL 109 105 99  CO2 15* 23 27  GLUCOSE 182* 131* 167*  BUN 39* 36* 32*  CREATININE 2.70* 2.26* 1.88*  CALCIUM 10.2 9.0 8.8*    Liver Function Tests: Recent Labs  Lab 12/27/19 1329  AST 25  ALT 19  ALKPHOS 58  BILITOT 0.6  PROT 8.5*  ALBUMIN 4.3   No results for input(s): LIPASE, AMYLASE in the last 168 hours. No results for input(s): AMMONIA in the last 168 hours.  CBC: Recent Labs  Lab 12/27/19 1329 12/28/19 0308 12/29/19 0026  WBC 7.0 5.3 6.3  HGB 13.8 11.0* 10.7*  HCT 43.0 32.3* 31.8*  MCV 90.5 87.1  88.6  PLT 430* 288 305    Cardiac Enzymes: Recent Labs  Lab 12/27/19 1329  CKTOTAL 160    BNP: Invalid input(s): POCBNP  CBG: Recent Labs  Lab 12/28/19 1232 12/28/19 1702 12/28/19 2228 12/29/19 0732 12/29/19 1129  GLUCAP 151* 157* 218* 98 126*    Microbiology: Results for orders placed or performed during the hospital encounter of 12/27/19  Respiratory Panel by RT PCR (Flu A&B, Covid) - Nasopharyngeal Swab     Status: None   Collection Time: 12/27/19  3:44 PM   Specimen: Nasopharyngeal Swab  Result Value Ref Range Status   SARS Coronavirus 2 by RT PCR NEGATIVE NEGATIVE Final    Comment: (NOTE) SARS-CoV-2 target nucleic acids are NOT DETECTED.  The SARS-CoV-2 RNA is generally detectable in upper respiratoy specimens during the acute phase of infection. The lowest concentration of SARS-CoV-2 viral copies this assay can detect is 131 copies/mL. A negative result does not preclude SARS-Cov-2 infection and should not be used as the sole basis for treatment or other patient management decisions. A negative result may occur with  improper specimen collection/handling, submission of specimen other than nasopharyngeal swab, presence of viral mutation(s) within the areas targeted by this assay, and inadequate number of viral copies (<131 copies/mL). A negative result must be combined with clinical observations, patient history, and epidemiological information. The expected  result is Negative.  Fact Sheet for Patients:  PinkCheek.be  Fact Sheet for Healthcare Providers:  GravelBags.it  This test is no t yet approved or cleared by the [place] FDA and  has been authorized for detection and/or diagnosis of SARS-CoV-2 by FDA under an Emergency Use Authorization (EUA). This EUA will remain  in effect (meaning this test can be used) for the duration of the COVID-19 declaration under Section 564(b)(1) of the Act,  21 U.S.C. section 360bbb-3(b)(1), unless the authorization is terminated or revoked sooner.     Influenza A by PCR NEGATIVE NEGATIVE Final   Influenza B by PCR NEGATIVE NEGATIVE Final    Comment: (NOTE) The Xpert Xpress SARS-CoV-2/FLU/RSV assay is intended as an aid in  the diagnosis of influenza from Nasopharyngeal swab specimens and  should not be used as a sole basis for treatment. Nasal washings and  aspirates are unacceptable for Xpert Xpress SARS-CoV-2/FLU/RSV  testing.  Fact Sheet for Patients: PinkCheek.be  Fact Sheet for Healthcare Providers: GravelBags.it  This test is not yet approved or cleared by the [place] FDA and  has been authorized for detection and/or diagnosis of SARS-CoV-2 by  FDA under an Emergency Use Authorization (EUA). This EUA will remain  in effect (meaning this test can be used) for the duration of the  Covid-19 declaration under Section 564(b)(1) of the Act, 21  U.S.C. section 360bbb-3(b)(1), unless the authorization is  terminated or revoked. Performed at [facility], [address] [place], [place] [address]     Coagulation Studies: Recent Labs    12/28/19 0243  LABPROT 13.2  INR 1.0    Urinalysis: No results for input(s): COLORURINE, LABSPEC, PHURINE, GLUCOSEU, HGBUR, BILIRUBINUR, KETONESUR, PROTEINUR, UROBILINOGEN, NITRITE, LEUKOCYTESUR in the last 72 hours.  Invalid input(s): APPERANCEUR    Imaging: US Venous Img Lower Bilateral (DVT)  Result Date: 12/27/2019 CLINICAL DATA:  Pain EXAM: Bilateral LOWER EXTREMITY VENOUS DOPPLER ULTRASOUND TECHNIQUE: Gray-scale sonography with compression, as well as color and duplex ultrasound, were performed to evaluate the deep venous system(s) from the level of the common femoral vein through the popliteal and proximal calf veins. COMPARISON:  None. FINDINGS: VENOUS Normal compressibility of the common femoral, superficial  femoral, and popliteal veins, as well as the visualized calf veins. Visualized portions of profunda femoral vein and great saphenous vein unremarkable. No filling defects to suggest DVT on grayscale or color Doppler imaging. Doppler waveforms show normal direction of venous flow, normal respiratory plasticity and response to augmentation. Limited views of the contralateral common femoral vein are unremarkable. OTHER None. Limitations: none IMPRESSION: Negative. Electronically Signed   By: Prudencio Pair M.D.   On: 12/27/2019 20:17     Medications:   . sodium chloride Stopped (12/29/19 0500)  . cefTRIAXone (ROCEPHIN)  IV Stopped (12/28/19 1705)  . heparin 1,250 Units/hr (12/29/19 0728)  .  sodium bicarbonate (isotonic) infusion in sterile water 125 mL/hr at 12/28/19 2246   . allopurinol  100 mg Oral QHS  . aspirin EC  81 mg Oral Daily  . atorvastatin  10 mg Oral Daily  . docusate sodium  100 mg Oral BID  . ezetimibe  10 mg Oral Daily  . insulin aspart  0-5 Units Subcutaneous QHS  . insulin aspart  0-9 Units Subcutaneous TID WC  . insulin glargine  6 Units Subcutaneous QHS  . metoprolol tartrate  12.5 mg Oral BID  . pantoprazole  40 mg Oral Daily   acetaminophen **OR** acetaminophen, calcium carbonate, fluticasone, morphine  injection, nitroGLYCERIN, ondansetron **OR** ondansetron (ZOFRAN) IV  Assessment/ Plan:  69 y.o. male with past medical history of diabetes mellitus type 2, hypertension, chronic kidney disease stage IIIb, history of knee surgery, recurrent E. coli UTI, elevated PSA, prostate biopsy 9/21 admitted with hyperkalemia.  1.  Acute kidney injury/chronic kidney disease stage IIIb baseline creatinine 1.7/EGFR 38 2.  Hyperkalemia, improved. 3.  Diabetes mellitus type 2 with chronic kidney disease. 4.  Dysuria/elevated PSA. 5.  NSTEMI.  Plan: The patient's renal function continues to improve.  Creatinine down to 1.9 with a urine output of 2.6 L.  Hyperkalemia also improved as  potassium currently 4.1.  Maintain the patient on sodium bicarbonate infusion for now.  Cardiac catheterization being considered.  Okay to proceed as long as kidney function continues to improve.  Would avoid left ventriculogram and attempt to minimize contrast exposure as much as possible.  Patient remains on heparin drip.   LOS: 2 Timmi Devora 10/3/20213:15 PM

## 2019-12-29 NOTE — Plan of Care (Signed)

## 2019-12-29 NOTE — Progress Notes (Signed)
Progress Note  Patient Name: [name] Date of Encounter: 12/29/2019  [facility] Cardiologist: Debbe Odea, MD   Subjective   Had chest pain yesterday evening, rang call bell, resolved with nitro. No further pain. Patient and his wife had extensive questions, see below. Breathing stable, no other concerns today. He does vaguely remember now having a femoral cath >10 years ago, may have been Dr. Jacinto Halim, he cannot recall. Does not think he had any blockages. He remembers that crestor made him feel uncoordinated. He does not recall if he was actually taking the atorvastatin at home.  Inpatient Medications    Scheduled Meds: . allopurinol  100 mg Oral QHS  . aspirin EC  81 mg Oral Daily  . atorvastatin  10 mg Oral Daily  . docusate sodium  100 mg Oral BID  . ezetimibe  10 mg Oral Daily  . insulin aspart  0-5 Units Subcutaneous QHS  . insulin aspart  0-9 Units Subcutaneous TID WC  . insulin glargine  6 Units Subcutaneous QHS  . metoprolol tartrate  12.5 mg Oral BID  . pantoprazole  40 mg Oral Daily   Continuous Infusions: . sodium chloride Stopped (12/29/19 0500)  . cefTRIAXone (ROCEPHIN)  IV Stopped (12/28/19 1705)  . heparin 1,250 Units/hr (12/29/19 0728)  .  sodium bicarbonate (isotonic) infusion in sterile water 125 mL/hr at 12/28/19 2246   PRN Meds: acetaminophen **OR** acetaminophen, calcium carbonate, fluticasone, morphine injection, nitroGLYCERIN, ondansetron **OR** ondansetron (ZOFRAN) IV   Vital Signs    Vitals:   12/29/19 0020 12/29/19 0600 12/29/19 0734 12/29/19 0810  BP: 125/65 132/72 138/73 139/73  Pulse: 79 71 67 70  Resp:  20 19   Temp:  98.6 F (37 C) 98.5 F (36.9 C)   TempSrc:  Oral Oral   SpO2: 100% 100% 98%   Weight:      Height:        Intake/Output Summary (Last 24 hours) at 12/29/2019 1322 Last data filed at 12/29/2019 0728 Gross per 24 hour  Intake 1682.25 ml  Output 2770 ml  Net -1087.75 ml   Last 3 Weights 12/27/2019  12/10/2019 12/04/2019  Weight (lbs) 234 lb 234 lb 230 lb  Weight (kg) 106.142 kg 106.142 kg 104.327 kg      Telemetry    SR, PVCs - Personally Reviewed  ECG    NSR, no acute changes - Personally Reviewed  Physical Exam   GEN: No acute distress.   Neck: No JVD Cardiac: RRR, no rubs, or gallops. 1/6 systolic murmur Respiratory: Clear to auscultation bilaterally. GI: Soft, nontender, non-distended  MS: No edema; No deformity. Neuro:  Nonfocal  Psych: Normal affect   Labs    High Sensitivity Troponin:   Recent Labs  Lab 12/27/19 1329 12/27/19 1543 12/28/19 0814 12/29/19 0026 12/29/19 0402  TROPONINIHS 4 33* 4,164* 1,582* 1,321*      Chemistry Recent Labs  Lab 12/27/19 1329 12/28/19 0308 12/29/19 0026  NA 133* 136 137  K 6.9* 5.1 4.1  CL 109 105 99  CO2 15* 23 27  GLUCOSE 182* 131* 167*  BUN 39* 36* 32*  CREATININE 2.70* 2.26* 1.88*  CALCIUM 10.2 9.0 8.8*  PROT 8.5*  --   --   ALBUMIN 4.3  --   --   AST 25  --   --   ALT 19  --   --   ALKPHOS 58  --   --   BILITOT 0.6  --   --  GFRNONAA 23* 29* 36*  GFRAA 27* 33* 42*  ANIONGAP 9 8 11      Hematology Recent Labs  Lab 12/27/19 1329 12/28/19 0308 12/29/19 0026  WBC 7.0 5.3 6.3  RBC 4.75 3.71* 3.59*  HGB 13.8 11.0* 10.7*  HCT 43.0 32.3* 31.8*  MCV 90.5 87.1 88.6  MCH 29.1 29.6 29.8  MCHC 32.1 34.1 33.6  RDW 14.4 14.4 14.1  PLT 430* 288 305    BNPNo results for input(s): BNP, PROBNP in the last 168 hours.   DDimer No results for input(s): DDIMER in the last 168 hours.   Radiology    DG Chest 2 View  Result Date: 12/27/2019 CLINICAL DATA:  Chest pain left arm pain EXAM: CHEST - 2 VIEW COMPARISON:  02/05/2019 FINDINGS: The heart size and mediastinal contours are within normal limits. Both lungs are clear. The visualized skeletal structures are unremarkable. IMPRESSION: No active cardiopulmonary disease. Electronically Signed   By: 13/12/2018 M.D.   On: 12/27/2019 14:10   02/26/2020 Venous Img  Lower Bilateral (DVT)  Result Date: 12/27/2019 CLINICAL DATA:  Pain EXAM: Bilateral LOWER EXTREMITY VENOUS DOPPLER ULTRASOUND TECHNIQUE: Gray-scale sonography with compression, as well as color and duplex ultrasound, were performed to evaluate the deep venous system(s) from the level of the common femoral vein through the popliteal and proximal calf veins. COMPARISON:  None. FINDINGS: VENOUS Normal compressibility of the common femoral, superficial femoral, and popliteal veins, as well as the visualized calf veins. Visualized portions of profunda femoral vein and great saphenous vein unremarkable. No filling defects to suggest DVT on grayscale or color Doppler imaging. Doppler waveforms show normal direction of venous flow, normal respiratory plasticity and response to augmentation. Limited views of the contralateral common femoral vein are unremarkable. OTHER None. Limitations: none IMPRESSION: Negative. Electronically Signed   By: 02/26/2020 M.D.   On: 12/27/2019 20:17    Cardiac Studies   Echo pending today  Echo 12/06/2016 - Procedure narrative: Transthoracic echocardiography. The study  was technically difficult due to chest wall and/or lung  interference.  - Left ventricle: The cavity size was normal. Wall thickness was  increased in a pattern of mild LVH. Left ventricular ejection  fraction is grossly normal but is suboptimally assessed due to  poor acoustic windows. The study is not technically sufficient to  allow evaluation of LV diastolic function.  - Right ventricle: Poorly visualized. Right ventricle is upper  normal to mildly dilated. Contraction is grossly normal in the  parasternal views but is suboptimally characterized due to poor  acoustic windows.   Patient Profile     69 y.o. male with a hx of prior PE treated with Eliquis, DM2, CKD stage III, HTN, HLD, spinal stenosis, and GERD who is being followed for the evaluation of chest pain/elevated troponin  at the request of Dr. 73.  Assessment & Plan    NSTEMI: -hsTn trend 4 > 33 >4164 >1321, can stop trending as it has peaked -using chew this admission. Needs tobacco cessation, which I discussed with him today. He will consider -counseled on contacting staff if he has chest pain -continue heparin, aspirin 81 mg -adding Zetia to atorvastatin, intolerant to rosuvastatin, would consider PCSK9i if not at goal -plan for cath 10/4 if renal function allows. He is amenable, see note 10/3. May need to be pushed back based on renal function, will make NPO tonight but I told them this may need to be changed. -echo pending today -continue metoprolol  Type II diabetes:  if CAD found, would consider GLP1RA. Reports dehydration on canagliflozin -A1c 8.7 -insulin per primary team  Acute kidney injury on CKD, chronic CKD3b (GFR 38 previously) with acute hyperkalemia -hold lisinopril, colchicine, metformin -hyperkalemia per nephrology, improved today to 4.1 from 6.9 on admission -monitor -Cr 1.88 today, from 2.7 on admission -remains on sodium bicarbonate. Possible cath timing based on renal function.  Hypertension: -holding lisinopril due to AKI -continue metoprolol  Hyperlipidemia: -reports intolerance to rosuvastatin -we spent significant time today discussing statins, his wife had many questions. Reviewed at length. -per outside notes, was on atorvastatin 10 mg daily, restarted. If tolerates, would increase to high intensity dose. He is unsure if he was actually taking this at home. -started ezetimibe this admission -if cannot get to goal on max tolerated statin and ezetimibe, and CAD found, would consider PCSK9i -lipids: Tchol 147, HDL 29, LDL 81, TG 186 (fasting)  Total time of encounter: 40 minutes total time of encounter, including 30 minutes spent in face-to-face patient care. This time includes coordination of care and counseling regarding workup, cath, kidney function, and  prolonged discussion re: statins. Remainder of non-face-to-face time involved reviewing chart documents/testing relevant to the patient encounter and documentation in the medical record.  Jodelle Red, MD, PhD [facility]   For questions or updates, please contact [facility] Please consult [email or web address] for contact info under        Signed, Jodelle Red, MD  12/29/2019, 1:22 PM

## 2019-12-29 NOTE — Progress Notes (Addendum)
Pt was complaining of 6/10 describing as tightness in the right cesht pain. Emergency adult medicine was utilized. Pt was placed on 2 liters oxygen , nitroglycerin tablet x 3, normal saline 10 ml/hr continues and stat EKG. Notify Cherylin Mylar NP. Will continue to monitor.  Update 0019: NP Katherina Right place order. Will continue to monitor.

## 2019-12-29 NOTE — Progress Notes (Signed)
ANTICOAGULATION CONSULT NOTE -   Pharmacy Consult for Heparin drip Indication: chest pain/ACS  Allergies  Allergen Reactions  . Crestor [Rosuvastatin Calcium] Other (See Comments)    myalgia  . Invokana [Canagliflozin] Other (See Comments)    dehydrated  . Prednisone Other (See Comments)    Patient Measurements: Height: 5\' 11"  (180.3 cm) Weight: 106.1 kg (234 lb) IBW/kg (Calculated) : 75.3 Heparin Dosing Weight: 97.7 kg  Vital Signs: Temp: 98.6 F (37 C) (10/03 0600) Temp Source: Oral (10/03 0600) BP: 132/72 (10/03 0600) Pulse Rate: 71 (10/03 0600)  Labs: Recent Labs    12/27/19 1329 12/27/19 1329 12/27/19 1543 12/28/19 0243 12/28/19 0243 12/28/19 0308 12/28/19 0814 12/28/19 1126 12/28/19 1851 12/29/19 0026  HGB 13.8   < >  --   --   --  11.0*  --   --   --  10.7*  HCT 43.0  --   --   --   --  32.3*  --   --   --  31.8*  PLT 430*  --   --   --   --  288  --   --   --  305  APTT  --   --   --  96*  --   --   --   --   --   --   LABPROT  --   --   --  13.2  --   --   --   --   --   --   INR  --   --   --  1.0  --   --   --   --   --   --   HEPARINUNFRC  --   --   --  0.75*   < >  --   --  0.49 0.53 0.58  CREATININE 2.70*  --   --   --   --  2.26*  --   --   --  1.88*  CKTOTAL 160  --   --   --   --   --   --   --   --   --   TROPONINIHS 4   < > 33*  --   --   --  4,164*  --   --  1,582*   < > = values in this interval not displayed.    Estimated Creatinine Clearance: 46.6 mL/min (A) (by C-G formula based on SCr of 1.88 mg/dL (H)).   Medical History: Past Medical History:  Diagnosis Date  . Allergy   . Chronic headaches   . Diabetes mellitus without complication (HCC)   . GERD (gastroesophageal reflux disease)   . Heart burn   . Hyperlipidemia   . Hypertension     Medications:  Scheduled:  . allopurinol  100 mg Oral QHS  . aspirin EC  81 mg Oral Daily  . atorvastatin  10 mg Oral Daily  . docusate sodium  100 mg Oral BID  . ezetimibe  10 mg Oral  Daily  . insulin aspart  0-5 Units Subcutaneous QHS  . insulin aspart  0-9 Units Subcutaneous TID WC  . insulin glargine  6 Units Subcutaneous QHS  . metoprolol tartrate  12.5 mg Oral BID  . pantoprazole  40 mg Oral Daily  . sodium zirconium cyclosilicate  10 g Oral Daily   Infusions:  . sodium chloride Stopped (12/29/19 0500)  . cefTRIAXone (ROCEPHIN)  IV Stopped (12/28/19 1705)  .  heparin 1,250 Units/hr (12/28/19 0526)  .  sodium bicarbonate (isotonic) infusion in sterile water 125 mL/hr at 12/28/19 2246    Assessment: 69 yo M to start Heparin drip for ACS/STEMI. Hgb 13.8  Plt 430  INR pending aPTT pending. No Anticoagulant PTA per Med Rec  Goal of Therapy:  Heparin level 0.3-0.7 units/ml Monitor platelets by anticoagulation protocol: Yes   1002 @ 0243 HL 0.75, slightly SUPRAtherapeutic.  CBC worse, likely dilutional.  Will decrease Heparin infusion to 1250 units/hr and recheck HL in 6 hours.  1002 @ 1126 HL 0.49, therapeutic x 1 1002 @ 1851 HL 0.53, therapeutic x 2 1003 @ 0026 HL 0.58, therapeutic x 3.  CBC stable   Plan:  Will continue Heparin infusion 1250 units/hr  Continue to monitor CBC/HL daily and s/sx bleeding complications  [name], PharmD Clinical Pharmacist 12/29/2019 6:03 AM

## 2019-12-30 ENCOUNTER — Encounter
Admission: EM | Disposition: A | Discharge status: Home / Self Care | Payer: Self-pay | Source: Home / Self Care | Attending: Internal Medicine

## 2019-12-30 ENCOUNTER — Ambulatory Visit: Payer: Self-pay | Admitting: Urology

## 2019-12-30 ENCOUNTER — Encounter: Payer: Self-pay | Admitting: Cardiovascular Disease

## 2019-12-30 ENCOUNTER — Encounter: Payer: Self-pay | Admitting: Anesthesiology

## 2019-12-30 ENCOUNTER — Other Ambulatory Visit: Payer: Self-pay

## 2019-12-30 DIAGNOSIS — I2510 Atherosclerotic heart disease of native coronary artery without angina pectoris: Secondary | ICD-10-CM

## 2019-12-30 DIAGNOSIS — N178 Other acute kidney failure: Secondary | ICD-10-CM

## 2019-12-30 HISTORY — PX: LEFT HEART CATH AND CORONARY ANGIOGRAPHY: CATH118249

## 2019-12-30 LAB — BASIC METABOLIC PANEL
Anion gap: 10 (ref 5–15)
BUN: 22 mg/dL (ref 8–23)
CO2: 33 mmol/L — ABNORMAL HIGH (ref 22–32)
Calcium: 8.4 mg/dL — ABNORMAL LOW (ref 8.9–10.3)
Chloride: 98 mmol/L (ref 98–111)
Creatinine, Ser: 1.65 mg/dL — ABNORMAL HIGH (ref 0.61–1.24)
GFR calc Af Amer: 49 mL/min — ABNORMAL LOW (ref >60)
GFR calc non Af Amer: 42 mL/min — ABNORMAL LOW (ref >60)
Glucose, Bld: 104 mg/dL — ABNORMAL HIGH (ref 70–99)
Potassium: 3.8 mmol/L (ref 3.5–5.1)
Sodium: 141 mmol/L (ref 135–145)

## 2019-12-30 LAB — CBC
HCT: 30.8 % — ABNORMAL LOW (ref 39.0–52.0)
Hemoglobin: 10.1 g/dL — ABNORMAL LOW (ref 13.0–17.0)
MCH: 29.5 pg (ref 26.0–34.0)
MCHC: 32.8 g/dL (ref 30.0–36.0)
MCV: 90.1 fL (ref 80.0–100.0)
Platelets: 277 10*3/uL (ref 150–400)
RBC: 3.42 MIL/uL — ABNORMAL LOW (ref 4.22–5.81)
RDW: 13.9 % (ref 11.5–15.5)
WBC: 5.2 10*3/uL (ref 4.0–10.5)
nRBC: 0 % (ref 0.0–0.2)

## 2019-12-30 LAB — ECHOCARDIOGRAM COMPLETE
AR max vel: 2.07 cm2
AV Area VTI: 2.05 cm2
AV Area mean vel: 1.97 cm2
AV Mean grad: 6 mmHg
AV Peak grad: 10.8 mmHg
Ao pk vel: 1.64 m/s
Area-P 1/2: 2.8 cm2
Height: 71 in
S' Lateral: 3.13 cm
Weight: 3744.01 oz

## 2019-12-30 LAB — GLUCOSE, CAPILLARY
Glucose-Capillary: 115 mg/dL — ABNORMAL HIGH (ref 70–99)
Glucose-Capillary: 118 mg/dL — ABNORMAL HIGH (ref 70–99)
Glucose-Capillary: 146 mg/dL — ABNORMAL HIGH (ref 70–99)
Glucose-Capillary: 190 mg/dL — ABNORMAL HIGH (ref 70–99)
Glucose-Capillary: 199 mg/dL — ABNORMAL HIGH (ref 70–99)

## 2019-12-30 LAB — POCT ACTIVATED CLOTTING TIME: Activated Clotting Time: 279 seconds

## 2019-12-30 LAB — HEPARIN LEVEL (UNFRACTIONATED): Heparin Unfractionated: 0.44 IU/mL (ref 0.30–0.70)

## 2019-12-30 LAB — HIV ANTIBODY (ROUTINE TESTING W REFLEX): HIV Screen 4th Generation wRfx: NONREACTIVE

## 2019-12-30 SURGERY — LEFT HEART CATH AND CORONARY ANGIOGRAPHY
Anesthesia: Moderate Sedation

## 2019-12-30 MED ORDER — IOHEXOL 300 MG/ML  SOLN
INTRAMUSCULAR | Status: DC | PRN
  Administered 2019-12-30: 75 mL

## 2019-12-30 MED ORDER — HEPARIN SODIUM (PORCINE) 1000 UNIT/ML IJ SOLN
INTRAMUSCULAR | Status: AC
  Filled 2019-12-30: qty 1

## 2019-12-30 MED ORDER — NITROGLYCERIN 1 MG/10 ML FOR IR/CATH LAB
INTRA_ARTERIAL | Status: AC
  Filled 2019-12-30: qty 10

## 2019-12-30 MED ORDER — HEPARIN (PORCINE) IN NACL 1000-0.9 UT/500ML-% IV SOLN
INTRAVENOUS | Status: AC
  Filled 2019-12-30: qty 1000

## 2019-12-30 MED ORDER — FENTANYL CITRATE (PF) 100 MCG/2ML IJ SOLN
INTRAMUSCULAR | Status: AC
  Filled 2019-12-30: qty 2

## 2019-12-30 MED ORDER — VERAPAMIL HCL 2.5 MG/ML IV SOLN
INTRAVENOUS | Status: DC | PRN
  Administered 2019-12-30: 2.5 mg via INTRAVENOUS

## 2019-12-30 MED ORDER — NITROGLYCERIN 1 MG/10 ML FOR IR/CATH LAB
INTRA_ARTERIAL | Status: DC | PRN
  Administered 2019-12-30: 200 ug

## 2019-12-30 MED ORDER — TICAGRELOR 90 MG PO TABS
90.0000 mg | ORAL_TABLET | Freq: Two times a day (BID) | ORAL | Status: DC
  Administered 2019-12-30 – 2019-12-31 (×2): 90 mg via ORAL
  Filled 2019-12-30 (×2): qty 1

## 2019-12-30 MED ORDER — HEPARIN (PORCINE) IN NACL 1000-0.9 UT/500ML-% IV SOLN
INTRAVENOUS | Status: DC | PRN
  Administered 2019-12-30: 1000 mL

## 2019-12-30 MED ORDER — TICAGRELOR 90 MG PO TABS
ORAL_TABLET | ORAL | Status: AC
  Filled 2019-12-30: qty 2

## 2019-12-30 MED ORDER — CARVEDILOL 6.25 MG PO TABS
6.2500 mg | ORAL_TABLET | Freq: Two times a day (BID) | ORAL | Status: DC
  Administered 2019-12-30 – 2019-12-31 (×2): 6.25 mg via ORAL
  Filled 2019-12-30 (×2): qty 1

## 2019-12-30 MED ORDER — ASPIRIN EC 81 MG PO TBEC
DELAYED_RELEASE_TABLET | ORAL | Status: AC
  Administered 2019-12-30: 81 mg via ORAL
  Filled 2019-12-30: qty 1

## 2019-12-30 MED ORDER — HEPARIN SODIUM (PORCINE) 1000 UNIT/ML IJ SOLN
INTRAMUSCULAR | Status: DC | PRN
  Administered 2019-12-30: 6000 [IU] via INTRAVENOUS
  Administered 2019-12-30: 5000 [IU] via INTRAVENOUS

## 2019-12-30 MED ORDER — MIDAZOLAM HCL 2 MG/2ML IJ SOLN
INTRAMUSCULAR | Status: AC
  Filled 2019-12-30: qty 2

## 2019-12-30 MED ORDER — MIDAZOLAM HCL 2 MG/2ML IJ SOLN
INTRAMUSCULAR | Status: DC | PRN
  Administered 2019-12-30 (×2): 1 mg via INTRAVENOUS

## 2019-12-30 MED ORDER — SODIUM CHLORIDE 0.9 % IV SOLN: 250.0000 mL | INTRAVENOUS | Status: DC | PRN

## 2019-12-30 MED ORDER — SODIUM CHLORIDE 0.9 % WEIGHT BASED INFUSION
1.0000 mL/kg/h | INTRAVENOUS | Status: AC
  Administered 2019-12-30: 1 mL/kg/h via INTRAVENOUS

## 2019-12-30 MED ORDER — SODIUM CHLORIDE 0.9% FLUSH
3.0000 mL | Freq: Two times a day (BID) | INTRAVENOUS | Status: DC
  Administered 2019-12-30 – 2019-12-31 (×2): 3 mL via INTRAVENOUS

## 2019-12-30 MED ORDER — TICAGRELOR 90 MG PO TABS
ORAL_TABLET | ORAL | Status: DC | PRN
  Administered 2019-12-30: 180 mg via ORAL

## 2019-12-30 MED ORDER — VERAPAMIL HCL 2.5 MG/ML IV SOLN
INTRAVENOUS | Status: AC
  Filled 2019-12-30: qty 2

## 2019-12-30 MED ORDER — SODIUM CHLORIDE 0.9% FLUSH: 3.0000 mL | INTRAVENOUS | Status: DC | PRN

## 2019-12-30 MED ORDER — FENTANYL CITRATE (PF) 100 MCG/2ML IJ SOLN
INTRAMUSCULAR | Status: DC | PRN
Start: 2019-12-30 — End: 2019-12-30
  Administered 2019-12-30: 25 ug via INTRAVENOUS
  Administered 2019-12-30: 50 ug via INTRAVENOUS

## 2019-12-30 MED ORDER — ATORVASTATIN CALCIUM 20 MG PO TABS
40.0000 mg | ORAL_TABLET | Freq: Every day | ORAL | Status: DC
  Administered 2019-12-31: 40 mg via ORAL
  Filled 2019-12-30: qty 2

## 2019-12-30 SURGICAL SUPPLY — 15 items
BALLN EUPHORA RX 2.5X20 (BALLOONS) ×3
BALLN ~~LOC~~ EUPHORA RX 3.0X20 (BALLOONS) ×3
BALLOON EUPHORA RX 2.5X20 (BALLOONS) IMPLANT
BALLOON ~~LOC~~ EUPHORA RX 3.0X20 (BALLOONS) IMPLANT
CATH INFINITI 5FR JK (CATHETERS) ×2 IMPLANT
CATH LAUNCHER 6FR JR4 (CATHETERS) ×2 IMPLANT
DEVICE RAD TR BAND REGULAR (VASCULAR PRODUCTS) ×2 IMPLANT
GLIDESHEATH SLEND SS 6F .021 (SHEATH) ×2 IMPLANT
GUIDEWIRE INQWIRE 1.5J.035X260 (WIRE) IMPLANT
INQWIRE 1.5J .035X260CM (WIRE) ×3
KIT ENCORE 26 ADVANTAGE (KITS) ×2 IMPLANT
KIT MANI 3VAL PERCEP (MISCELLANEOUS) ×3 IMPLANT
PACK CARDIAC CATH (CUSTOM PROCEDURE TRAY) ×3 IMPLANT
STENT RESOLUTE ONYX 2.75X34 (Permanent Stent) ×2 IMPLANT
WIRE RUNTHROUGH .014X180CM (WIRE) ×2 IMPLANT

## 2019-12-30 NOTE — Interval H&P Note (Signed)
Cath Lab Visit (complete for each Cath Lab visit)  Clinical Evaluation Leading to the Procedure:   ACS: Yes.    Non-ACS:  n/a    History and Physical Interval Note:  12/30/2019 8:53 AM  [name]  has presented today for surgery, with the diagnosis of non ST segment myocardial infarction.  The various methods of treatment have been discussed with the patient and family. After consideration of risks, benefits and other options for treatment, the patient has consented to  Procedure(s): LEFT HEART CATH AND CORONARY ANGIOGRAPHY (N/A) as a surgical intervention.  The patient's history has been reviewed, patient examined, no change in status, stable for surgery.  I have reviewed the patient's chart and labs.  Questions were answered to the patient's satisfaction.     Lorine Bears

## 2019-12-30 NOTE — Progress Notes (Signed)
Patient ID: [name], male   DOB: May 23, 1950, 69 y.o.   MRN: [id number] [facility] PROGRESS NOTE  [name] OZH:[id number] DOB: 1951/01/22 DOA: 12/27/2019 PCP: [name], DO  HPI/Subjective: Patient did not sleep well last night and a little bit tired.  No complaints of chest pain or shortness of breath.  Objective: Vitals:   12/30/19 1130 12/30/19 1200  BP: (!) 144/72 138/66  Pulse: 68 65  Resp: 16 14  Temp:    SpO2: 97% 97%    Intake/Output Summary (Last 24 hours) at 12/30/2019 1210 Last data filed at 12/30/2019 1139 Gross per 24 hour  Intake 2431.5 ml  Output 3125 ml  Net -693.5 ml   Filed Weights   12/27/19 1326 12/30/19 0424  Weight: 106.1 kg 103.1 kg    ROS: Review of Systems  Respiratory: Negative for shortness of breath.   Cardiovascular: Negative for chest pain.  Gastrointestinal: Negative for abdominal pain, nausea and vomiting.   Exam: Physical Exam HENT:     Head: Normocephalic.     Mouth/Throat:     Pharynx: No oropharyngeal exudate.  Eyes:     General: Lids are normal.     Conjunctiva/sclera: Conjunctivae normal.  Cardiovascular:     Rate and Rhythm: Normal rate and regular rhythm.     Heart sounds: Normal heart sounds, S1 normal and S2 normal.  Pulmonary:     Breath sounds: No decreased breath sounds, wheezing, rhonchi or rales.  Abdominal:     Palpations: Abdomen is soft.     Tenderness: There is no abdominal tenderness.  Musculoskeletal:     Right lower leg: No swelling.     Left lower leg: No swelling.  Skin:    General: Skin is warm.     Findings: No rash.  Neurological:     Mental Status: He is alert and oriented to person, place, and time.       Data Reviewed: Basic Metabolic Panel: Recent Labs  Lab 12/27/19 1329 12/28/19 0308 12/29/19 0026 12/30/19 0502  NA 133* 136 137 141  K 6.9* 5.1 4.1 3.8  CL 109 105 99 98  CO2 15* 23 27 33*  GLUCOSE 182* 131* 167* 104*  BUN 39* 36* 32* 22   CREATININE 2.70* 2.26* 1.88* 1.65*  CALCIUM 10.2 9.0 8.8* 8.4*   Liver Function Tests: Recent Labs  Lab 12/27/19 1329  AST 25  ALT 19  ALKPHOS 58  BILITOT 0.6  PROT 8.5*  ALBUMIN 4.3   CBC: Recent Labs  Lab 12/27/19 1329 12/28/19 0308 12/29/19 0026 12/30/19 0458  WBC 7.0 5.3 6.3 5.2  HGB 13.8 11.0* 10.7* 10.1*  HCT 43.0 32.3* 31.8* 30.8*  MCV 90.5 87.1 88.6 90.1  PLT 430* 288 305 277   Cardiac Enzymes: Recent Labs  Lab 12/27/19 1329  CKTOTAL 160    CBG: Recent Labs  Lab 12/29/19 1129 12/29/19 1622 12/29/19 2036 12/30/19 0732 12/30/19 1000  GLUCAP 126* 183* 220* 115* 118*    Recent Results (from the past 240 hour(s))  Respiratory Panel by RT PCR (Flu A&B, Covid) - Nasopharyngeal Swab     Status: None   Collection Time: 12/27/19  3:44 PM   Specimen: Nasopharyngeal Swab  Result Value Ref Range Status   SARS Coronavirus 2 by RT PCR NEGATIVE NEGATIVE Final    Comment: (NOTE) SARS-CoV-2 target nucleic acids are NOT DETECTED.  The SARS-CoV-2 RNA is generally detectable in upper respiratoy specimens during the acute phase of infection. The lowest  concentration of SARS-CoV-2 viral copies this assay can detect is 131 copies/mL. A negative result does not preclude SARS-Cov-2 infection and should not be used as the sole basis for treatment or other patient management decisions. A negative result may occur with  improper specimen collection/handling, submission of specimen other than nasopharyngeal swab, presence of viral mutation(s) within the areas targeted by this assay, and inadequate number of viral copies (<131 copies/mL). A negative result must be combined with clinical observations, patient history, and epidemiological information. The expected result is Negative.  Fact Sheet for Patients:  https://www.moore.com/  Fact Sheet for Healthcare Providers:  https://www.young.biz/  This test is no t yet approved or  cleared by the [place] FDA and  has been authorized for detection and/or diagnosis of SARS-CoV-2 by FDA under an Emergency Use Authorization (EUA). This EUA will remain  in effect (meaning this test can be used) for the duration of the COVID-19 declaration under Section 564(b)(1) of the Act, 21 U.S.C. section 360bbb-3(b)(1), unless the authorization is terminated or revoked sooner.     Influenza A by PCR NEGATIVE NEGATIVE Final   Influenza B by PCR NEGATIVE NEGATIVE Final    Comment: (NOTE) The Xpert Xpress SARS-CoV-2/FLU/RSV assay is intended as an aid in  the diagnosis of influenza from Nasopharyngeal swab specimens and  should not be used as a sole basis for treatment. Nasal washings and  aspirates are unacceptable for Xpert Xpress SARS-CoV-2/FLU/RSV  testing.  Fact Sheet for Patients: https://www.moore.com/  Fact Sheet for Healthcare Providers: https://www.young.biz/  This test is not yet approved or cleared by the [place] FDA and  has been authorized for detection and/or diagnosis of SARS-CoV-2 by  FDA under an Emergency Use Authorization (EUA). This EUA will remain  in effect (meaning this test can be used) for the duration of the  Covid-19 declaration under Section 564(b)(1) of the Act, 21  U.S.C. section 360bbb-3(b)(1), unless the authorization is  terminated or revoked. Performed at [facility], [address] [place], [place] [address]      Studies: CARDIAC CATHETERIZATION  Result Date: 12/30/2019  Prox RCA-1 lesion is 90% stenosed.  Prox RCA-2 lesion is 85% stenosed.  Mid RCA lesion is 30% stenosed.  Dist RCA lesion is 30% stenosed.  3rd Mrg lesion is 40% stenosed.  Prox LAD lesion is 40% stenosed.  Mid LAD lesion is 30% stenosed.  1st Diag lesion is 40% stenosed.  A drug-eluting stent was successfully placed using a STENT RESOLUTE ONYX [id number].  Post intervention, there is a 0% residual  stenosis.  Post intervention, there is a 0% residual stenosis.  1.  Severe one-vessel coronary artery disease involving proximal right coronary artery.  There is also moderate LAD and left circumflex disease. 2.  Left ventricular angiography was not performed due to chronic kidney disease.  EF was normal by echo.  Mildly elevated left ventricular end-diastolic pressure around 20 mmHg. 3.  Successful angioplasty and drug-eluting stent placement to the proximal right coronary artery. Recommendations: Dual antiplatelet therapy for at least 1 year. Aggressive treatment of risk factors.  I increase atorvastatin to 40 mg daily to see if the patient can tolerate. I switch metoprolol to carvedilol for better blood pressure control. Hydrate overnight and check renal function in the morning.  75 mL of contrast was used.    ECHOCARDIOGRAM COMPLETE  Result Date: 12/30/2019    ECHOCARDIOGRAM REPORT   Patient Name:   [name] Date of Exam: 12/29/2019 Medical Rec #:  [id number]          Height:       71.0 in Accession #:    [id number]         Weight:       234.0 lb Date of Birth:  09/28/1950           BSA:          2.254 m Patient Age:    69 years           BP:           132/72 mmHg Patient Gender: M                  HR:           73 bpm. Exam Location:  [facility] Procedure: 2D Echo and Intracardiac Opacification Agent Indications:     Chest Pain R07.9  History:         Patient has prior history of Echocardiogram examinations, most                  recent 12/06/2016.  Sonographer:     Wonda Ceriseikeshia Stills RDCS Referring Phys:  [id number] Raymon MuttonYAN M DUNN Diagnosing Phys: Jodelle RedBridgette Christopher MD  Sonographer Comments: Technically difficult study due to poor echo windows and Technically challenging study due to limited acoustic windows. Image acquisition challenging due to patient body habitus. IMPRESSIONS  1. Left ventricular ejection fraction, by estimation, is 60 to 65%. The left ventricle has normal function. The left ventricle has no  regional wall motion abnormalities. Left ventricular diastolic parameters were normal.  2. Right ventricular systolic function was not well visualized. The right ventricular size is not well visualized.  3. The mitral valve is grossly normal. Trivial mitral valve regurgitation. No evidence of mitral stenosis.  4. The aortic valve is grossly normal. Aortic valve regurgitation is not visualized. No aortic stenosis is present. Conclusion(s)/Recommendation(s): Technically challenging study, even with use of definity. Grossly normal EF and wall motion, but reduced sensitivity for focal abnormalities. FINDINGS  Left Ventricle: Left ventricular ejection fraction, by estimation, is 60 to 65%. The left ventricle has normal function. The left ventricle has no regional wall motion abnormalities. Definity contrast agent was given IV to delineate the left ventricular  endocardial borders. The left ventricular internal cavity size was normal in size. There is borderline left ventricular hypertrophy. Left ventricular diastolic parameters were normal. Right Ventricle: The right ventricular size is not well visualized. Right vetricular wall thickness was not well visualized. Right ventricular systolic function was not well visualized. Left Atrium: Left atrial size was normal in size. Right Atrium: Right atrial size was not well visualized. Pericardium: The pericardium was not well visualized. Mitral Valve: The mitral valve is grossly normal. Trivial mitral valve regurgitation. No evidence of mitral valve stenosis. Tricuspid Valve: The tricuspid valve is not well visualized. Tricuspid valve regurgitation is trivial. Aortic Valve: The aortic valve is grossly normal. There is mild aortic valve annular calcification. Aortic valve regurgitation is not visualized. No aortic stenosis is present. Aortic valve mean gradient measures 6.0 mmHg. Aortic valve peak gradient measures 10.8 mmHg. Aortic valve area, by VTI measures 2.05 cm. Pulmonic  Valve: The pulmonic valve was not well visualized. Pulmonic valve regurgitation is not visualized. Aorta: The aortic root and ascending aorta are structurally normal, with no evidence of dilitation. IAS/Shunts: The atrial septum is grossly normal.  LEFT VENTRICLE PLAX 2D LVIDd:         4.96 cm  Diastology LVIDs:  3.13 cm  LV e' medial:    7.18 cm/s LV PW:         1.17 cm  LV E/e' medial:  11.8 LV IVS:        1.22 cm  LV e' lateral:   10.20 cm/s LVOT diam:     2.00 cm  LV E/e' lateral: 8.3 LV SV:         71 LV SV Index:   31 LVOT Area:     3.14 cm  LEFT ATRIUM             Index LA diam:        3.00 cm 1.33 cm/m LA Vol (A2C):   33.0 ml 14.64 ml/m LA Vol (A4C):   26.2 ml 11.62 ml/m LA Biplane Vol: 29.6 ml 13.13 ml/m  AORTIC VALVE                    PULMONIC VALVE AV Area (Vmax):    2.07 cm     PV Vmax:       1.06 m/s AV Area (Vmean):   1.97 cm     PV Peak grad:  4.5 mmHg AV Area (VTI):     2.05 cm AV Vmax:           164.00 cm/s AV Vmean:          118.000 cm/s AV VTI:            0.346 m AV Peak Grad:      10.8 mmHg AV Mean Grad:      6.0 mmHg LVOT Vmax:         108.00 cm/s LVOT Vmean:        74.100 cm/s LVOT VTI:          0.226 m LVOT/AV VTI ratio: 0.65  AORTA Ao Root diam: 2.80 cm Ao Asc diam:  2.90 cm MITRAL VALVE MV Area (PHT): 2.80 cm    SHUNTS MV Decel Time: 271 msec    Systemic VTI:  0.23 m MV E velocity: 84.80 cm/s  Systemic Diam: 2.00 cm MV A velocity: 94.70 cm/s MV E/A ratio:  0.90 Jodelle Red MD Electronically signed by Jodelle Red MD Signature Date/Time: 12/30/2019/7:46:54 AM    Final     Scheduled Meds: . [MAR Hold] allopurinol  100 mg Oral QHS  . [MAR Hold] aspirin EC  81 mg Oral Daily  . [MAR Hold] atorvastatin  10 mg Oral Daily  . [MAR Hold] docusate sodium  100 mg Oral BID  . [MAR Hold] ezetimibe  10 mg Oral Daily  . [MAR Hold] insulin aspart  0-5 Units Subcutaneous QHS  . [MAR Hold] insulin aspart  0-9 Units Subcutaneous TID WC  . [MAR Hold] insulin glargine   6 Units Subcutaneous QHS  . [MAR Hold] metoprolol tartrate  12.5 mg Oral BID  . [MAR Hold] pantoprazole  40 mg Oral Daily  . sodium chloride flush  3 mL Intravenous Q12H  . ticagrelor  90 mg Oral BID   Continuous Infusions: . sodium chloride Stopped (12/29/19 0500)  . sodium chloride    . sodium chloride    . [MAR Hold] cefTRIAXone (ROCEPHIN)  IV Stopped (12/29/19 1859)    Assessment/Plan:  1. Acute myocardial infarction.  Patient had stent in the RCA by interventional cardiology.  Continue aspirin, blocker, atorvastatin and Zetia.  Brilinta added.  Echocardiogram showed a normal ejection fraction. 2. Acute kidney injury on chronic kidney disease stage IIIa.  Creatinine  improved to 1.65 today.  Hydration overnight and check creatinine tomorrow. 3. Hyperkalemia on presentation.  This was secondary to Bactrim and acute on chronic kidney disease.  Potassium in normal range. 4. Type 2 diabetes mellitus on chronic kidney disease stage IIIa. On low-dose [name] insulin 5. UTI after previous prostate biopsy.  Urology wanted antibiotics for a month.  Continue holding Bactrim.  Rocephin while here. 6. History of gout.  Continue renally dosed allopurinol    Code Status:     Code Status Orders  (From admission, onward)         Start     Ordered   12/27/19 1532  Full code  Continuous        12/27/19 1532        Code Status History    Date Active Date Inactive Code Status Order ID Comments User Context   12/06/2016 0105 12/06/2016 2127 Full Code [id number]  Oralia Manis, MD Inpatient   Advance Care Planning Activity     Family Communication: Spoke with wife at the bedside Disposition Plan: Status is: Inpatient  Dispo: The patient is from: Home              Anticipated d/c is to: Home              Anticipated d/c date is: 12/31/2019              Patient currently recovering after stent in the RCA today.  Consultants:  Cardiology  Nephrology  Time spent: 26 minutes  [name]  [employer]

## 2019-12-30 NOTE — Care Management Important Message (Signed)
Important Message  Patient Details  Name: [name] MRN: [id number] Date of Birth: May 20, 1950   Medicare Important Message Given:  Yes     Johnell Comings 12/30/2019, 1:14 PM

## 2019-12-30 NOTE — Progress Notes (Signed)
Progress Note  Patient Name: [name] Date of Encounter: 12/30/2019  [facility] Cardiologist: Debbe Odea, MD   Subjective   No chest pain.  The patient underwent cardiac catheterization via the right radial artery this morning which showed significant stenosis affecting the right coronary artery and moderate disease affecting the LAD and left circumflex.  I performed successful angioplasty and drug-eluting stent placement to the right coronary artery.  Inpatient Medications    Scheduled Meds: . [MAR Hold] allopurinol  100 mg Oral QHS  . [MAR Hold] aspirin EC  81 mg Oral Daily  . [MAR Hold] atorvastatin  10 mg Oral Daily  . [MAR Hold] docusate sodium  100 mg Oral BID  . [MAR Hold] ezetimibe  10 mg Oral Daily  . [MAR Hold] insulin aspart  0-5 Units Subcutaneous QHS  . [MAR Hold] insulin aspart  0-9 Units Subcutaneous TID WC  . [MAR Hold] insulin glargine  6 Units Subcutaneous QHS  . [MAR Hold] metoprolol tartrate  12.5 mg Oral BID  . [MAR Hold] pantoprazole  40 mg Oral Daily  . sodium chloride flush  3 mL Intravenous Q12H  . sodium chloride flush  3 mL Intravenous Q12H  . ticagrelor  90 mg Oral BID   Continuous Infusions: . sodium chloride    . sodium chloride 75 mL/hr at 12/30/19 0547  . sodium chloride Stopped (12/29/19 0500)  . sodium chloride    . sodium chloride    . [MAR Hold] cefTRIAXone (ROCEPHIN)  IV Stopped (12/29/19 1859)  . heparin Stopped (12/30/19 0811)   PRN Meds: sodium chloride, sodium chloride, [MAR Hold] acetaminophen **OR** [MAR Hold] acetaminophen, [MAR Hold] calcium carbonate, [MAR Hold] fluticasone, [MAR Hold]  morphine injection, [MAR Hold] nitroGLYCERIN, [MAR Hold] ondansetron **OR** [MAR Hold] ondansetron (ZOFRAN) IV, sodium chloride flush, sodium chloride flush   Vital Signs    Vitals:   12/30/19 0733 12/30/19 0803 12/30/19 0952 12/30/19 1000  BP: 126/84 139/68 130/71 (!) 141/72  Pulse: 73  75 76  Resp: 17 17 11 17   Temp:  98.6 F (37 C)     TempSrc: Oral     SpO2: 97%  97% 96%  Weight:      Height:        Intake/Output Summary (Last 24 hours) at 12/30/2019 1022 Last data filed at 12/30/2019 02/29/2020 Gross per 24 hour  Intake 2431.5 ml  Output 2725 ml  Net -293.5 ml   Last 3 Weights 12/30/2019 12/27/2019 12/10/2019  Weight (lbs) 227 lb 3.2 oz 234 lb 234 lb  Weight (kg) 103.057 kg 106.142 kg 106.142 kg      Telemetry    SR, PVCs - Personally Reviewed  ECG    NSR, no acute changes - Personally Reviewed  Physical Exam   GEN: No acute distress.   Neck: No JVD Cardiac: RRR, no rubs, or gallops. 1/6 systolic murmur Respiratory: Clear to auscultation bilaterally. GI: Soft, nontender, non-distended  MS: No edema; No deformity. Neuro:  Nonfocal  Psych: Normal affect   Labs    High Sensitivity Troponin:   Recent Labs  Lab 12/27/19 1329 12/27/19 1543 12/28/19 0814 12/29/19 0026 12/29/19 0402  TROPONINIHS 4 33* 4,164* 1,582* 1,321*      Chemistry Recent Labs  Lab 12/27/19 1329 12/27/19 1329 12/28/19 0308 12/29/19 0026 12/30/19 0502  NA 133*   < > 136 137 141  K 6.9*   < > 5.1 4.1 3.8  CL 109   < > 105 99 98  CO2 15*   < > 23 27 33*  GLUCOSE 182*   < > 131* 167* 104*  BUN 39*   < > 36* 32* 22  CREATININE 2.70*   < > 2.26* 1.88* 1.65*  CALCIUM 10.2   < > 9.0 8.8* 8.4*  PROT 8.5*  --   --   --   --   ALBUMIN 4.3  --   --   --   --   AST 25  --   --   --   --   ALT 19  --   --   --   --   ALKPHOS 58  --   --   --   --   BILITOT 0.6  --   --   --   --   GFRNONAA 23*   < > 29* 36* 42*  GFRAA 27*   < > 33* 42* 49*  ANIONGAP 9   < > 8 11 10    < > = values in this interval not displayed.     Hematology Recent Labs  Lab 12/28/19 0308 12/29/19 0026 12/30/19 0458  WBC 5.3 6.3 5.2  RBC 3.71* 3.59* 3.42*  HGB 11.0* 10.7* 10.1*  HCT 32.3* 31.8* 30.8*  MCV 87.1 88.6 90.1  MCH 29.6 29.8 29.5  MCHC 34.1 33.6 32.8  RDW 14.4 14.1 13.9  PLT 288 305 277    BNPNo results for  input(s): BNP, PROBNP in the last 168 hours.   DDimer No results for input(s): DDIMER in the last 168 hours.   Radiology    CARDIAC CATHETERIZATION  Result Date: 12/30/2019  Prox RCA-1 lesion is 90% stenosed.  Prox RCA-2 lesion is 85% stenosed.  Mid RCA lesion is 30% stenosed.  Dist RCA lesion is 30% stenosed.  3rd Mrg lesion is 40% stenosed.  Prox LAD lesion is 40% stenosed.  Mid LAD lesion is 30% stenosed.  1st Diag lesion is 40% stenosed.  A drug-eluting stent was successfully placed using a STENT RESOLUTE ONYX 02/29/2020.  Post intervention, there is a 0% residual stenosis.  Post intervention, there is a 0% residual stenosis.  1.  Severe one-vessel coronary artery disease involving proximal right coronary artery.  There is also moderate LAD and left circumflex disease. 2.  Left ventricular angiography was not performed due to chronic kidney disease.  EF was normal by echo.  Mildly elevated left ventricular end-diastolic pressure around 20 mmHg. 3.  Successful angioplasty and drug-eluting stent placement to the proximal right coronary artery. Recommendations: Dual antiplatelet therapy for at least 1 year. Aggressive treatment of risk factors.  I increase atorvastatin to 40 mg daily to see if the patient can tolerate. I switch metoprolol to carvedilol for better blood pressure control. Hydrate overnight and check renal function in the morning.  75 mL of contrast was used.    ECHOCARDIOGRAM COMPLETE  Result Date: 12/30/2019    ECHOCARDIOGRAM REPORT   Patient Name:   [name] Date of Exam: 12/29/2019 Medical Rec #:  02/28/2020          Height:       71.0 in Accession #:    [id number]         Weight:       234.0 lb Date of Birth:  [birth year]           BSA:          2.254 m Patient Age:    69 years  BP:           132/72 mmHg Patient Gender: M                  HR:           73 bpm. Exam Location:  [facility] Procedure: 2D Echo and Intracardiac Opacification Agent Indications:     Chest  Pain R07.9  History:         Patient has prior history of Echocardiogram examinations, most                  recent 12/06/2016.  Sonographer:     Wonda Cerise RDCS Referring Phys:  [id number] Raymon Mutton DUNN Diagnosing Phys: Jodelle Red MD  Sonographer Comments: Technically difficult study due to poor echo windows and Technically challenging study due to limited acoustic windows. Image acquisition challenging due to patient body habitus. IMPRESSIONS  1. Left ventricular ejection fraction, by estimation, is 60 to 65%. The left ventricle has normal function. The left ventricle has no regional wall motion abnormalities. Left ventricular diastolic parameters were normal.  2. Right ventricular systolic function was not well visualized. The right ventricular size is not well visualized.  3. The mitral valve is grossly normal. Trivial mitral valve regurgitation. No evidence of mitral stenosis.  4. The aortic valve is grossly normal. Aortic valve regurgitation is not visualized. No aortic stenosis is present. Conclusion(s)/Recommendation(s): Technically challenging study, even with use of definity. Grossly normal EF and wall motion, but reduced sensitivity for focal abnormalities. FINDINGS  Left Ventricle: Left ventricular ejection fraction, by estimation, is 60 to 65%. The left ventricle has normal function. The left ventricle has no regional wall motion abnormalities. Definity contrast agent was given IV to delineate the left ventricular  endocardial borders. The left ventricular internal cavity size was normal in size. There is borderline left ventricular hypertrophy. Left ventricular diastolic parameters were normal. Right Ventricle: The right ventricular size is not well visualized. Right vetricular wall thickness was not well visualized. Right ventricular systolic function was not well visualized. Left Atrium: Left atrial size was normal in size. Right Atrium: Right atrial size was not well visualized.  Pericardium: The pericardium was not well visualized. Mitral Valve: The mitral valve is grossly normal. Trivial mitral valve regurgitation. No evidence of mitral valve stenosis. Tricuspid Valve: The tricuspid valve is not well visualized. Tricuspid valve regurgitation is trivial. Aortic Valve: The aortic valve is grossly normal. There is mild aortic valve annular calcification. Aortic valve regurgitation is not visualized. No aortic stenosis is present. Aortic valve mean gradient measures 6.0 mmHg. Aortic valve peak gradient measures 10.8 mmHg. Aortic valve area, by VTI measures 2.05 cm. Pulmonic Valve: The pulmonic valve was not well visualized. Pulmonic valve regurgitation is not visualized. Aorta: The aortic root and ascending aorta are structurally normal, with no evidence of dilitation. IAS/Shunts: The atrial septum is grossly normal.  LEFT VENTRICLE PLAX 2D LVIDd:         4.96 cm  Diastology LVIDs:         3.13 cm  LV e' medial:    7.18 cm/s LV PW:         1.17 cm  LV E/e' medial:  11.8 LV IVS:        1.22 cm  LV e' lateral:   10.20 cm/s LVOT diam:     2.00 cm  LV E/e' lateral: 8.3 LV SV:         71 LV SV Index:   31  LVOT Area:     3.14 cm  LEFT ATRIUM             Index LA diam:        3.00 cm 1.33 cm/m LA Vol (A2C):   33.0 ml 14.64 ml/m LA Vol (A4C):   26.2 ml 11.62 ml/m LA Biplane Vol: 29.6 ml 13.13 ml/m  AORTIC VALVE                    PULMONIC VALVE AV Area (Vmax):    2.07 cm     PV Vmax:       1.06 m/s AV Area (Vmean):   1.97 cm     PV Peak grad:  4.5 mmHg AV Area (VTI):     2.05 cm AV Vmax:           164.00 cm/s AV Vmean:          118.000 cm/s AV VTI:            0.346 m AV Peak Grad:      10.8 mmHg AV Mean Grad:      6.0 mmHg LVOT Vmax:         108.00 cm/s LVOT Vmean:        74.100 cm/s LVOT VTI:          0.226 m LVOT/AV VTI ratio: 0.65  AORTA Ao Root diam: 2.80 cm Ao Asc diam:  2.90 cm MITRAL VALVE MV Area (PHT): 2.80 cm    SHUNTS MV Decel Time: 271 msec    Systemic VTI:  0.23 m MV E velocity:  84.80 cm/s  Systemic Diam: 2.00 cm MV A velocity: 94.70 cm/s MV E/A ratio:  0.90 Jodelle RedBridgette Christopher MD Electronically signed by Jodelle RedBridgette Christopher MD Signature Date/Time: 12/30/2019/7:46:54 AM    Final     Cardiac Studies   Echo pending today  Echo 12/06/2016 - Procedure narrative: Transthoracic echocardiography. The study  was technically difficult due to chest wall and/or lung  interference.  - Left ventricle: The cavity size was normal. Wall thickness was  increased in a pattern of mild LVH. Left ventricular ejection  fraction is grossly normal but is suboptimally assessed due to  poor acoustic windows. The study is not technically sufficient to  allow evaluation of LV diastolic function.  - Right ventricle: Poorly visualized. Right ventricle is upper  normal to mildly dilated. Contraction is grossly normal in the  parasternal views but is suboptimally characterized due to poor  acoustic windows.   Patient Profile     69 y.o. male with a hx of prior PE treated with Eliquis, DM2, CKD stage III, HTN, HLD, spinal stenosis, and GERD who is being followed for the evaluation of chest pain/elevated troponin at the request of Dr. Renae GlossWieting.  Assessment & Plan    NSTEMI: -Echocardiogram showed normal LV systolic function.  Cardiac catheterization was done which showed severe right coronary artery stenosis which was treated successfully with PCI and drug-eluting stent placement.  The patient has moderate LAD and left circumflex disease that does not require revascularization at the present time.  Recommend aggressive medical therapy. The patient was started on ticagrelor which should be continued with low-dose aspirin for at least 1 year. Continue aggressive treatment of risk factors.   Acute kidney injury on CKD: Renal function improved gradually after stopping lisinopril.  Avoid nephrotoxic medications.  75 mL was used during cardiac catheterization and PCI.  We will  hydrate the patient overnight and recheck renal  function in the morning.    Hypertension: -holding lisinopril due to AKI -I switched metoprolol to carvedilol for better blood pressure control.  Hyperlipidemia: -reports intolerance to rosuvastatin -Continue small dose atorvastatin 10 mg daily. -started ezetimibe this admission -if cannot get to goal on max tolerated statin and ezetimibe, and CAD found, would consider PCSK9i -lipids: Tchol 147, HDL 29, LDL 81, TG 186 (fasting)  Given the need for hydration, will keep the patient overnight and recheck renal function in the morning.  For questions or updates, please contact [facility] Please consult [email or web address] for contact info under        Signed, Lorine Bears, MD  12/30/2019, 10:22 AM

## 2019-12-30 NOTE — Progress Notes (Signed)
ANTICOAGULATION CONSULT NOTE -   Pharmacy Consult for Heparin drip Indication: chest pain/ACS  Allergies  Allergen Reactions   Crestor [Rosuvastatin Calcium] Other (See Comments)    myalgia   Invokana [Canagliflozin] Other (See Comments)    dehydrated   Prednisone Other (See Comments)    Patient Measurements: Height: 5\' 11"  (180.3 cm) Weight: 103.1 kg (227 lb 3.2 oz) IBW/kg (Calculated) : 75.3 Heparin Dosing Weight: 97.7 kg  Vital Signs: Temp: 98.6 F (37 C) (10/04 0733) Temp Source: Oral (10/04 0733) BP: 126/84 (10/04 0733) Pulse Rate: 73 (10/04 0733)  Labs: Recent Labs    12/27/19 1329 12/27/19 1329 12/27/19 1543 12/28/19 0243 12/28/19 0308 12/28/19 0308 12/28/19 0814 12/28/19 1126 12/28/19 1851 12/29/19 0026 12/29/19 0402 12/30/19 0458  HGB 13.8   < >  --   --  11.0*   < >  --   --   --  10.7*  --  10.1*  HCT 43.0   < >  --   --  32.3*  --   --   --   --  31.8*  --  30.8*  PLT 430*   < >  --   --  288  --   --   --   --  305  --  277  APTT  --   --   --  96*  --   --   --   --   --   --   --   --   LABPROT  --   --   --  13.2  --   --   --   --   --   --   --   --   INR  --   --   --  1.0  --   --   --   --   --   --   --   --   HEPARINUNFRC  --   --   --  0.75*  --   --   --    < > 0.53 0.58  --  0.44  CREATININE 2.70*  --   --   --  2.26*  --   --   --   --  1.88*  --   --   CKTOTAL 160  --   --   --   --   --   --   --   --   --   --   --   TROPONINIHS 4  --    < >  --   --   --  4,164*  --   --  1,582* 1,321*  --    < > = values in this interval not displayed.    Estimated Creatinine Clearance: 46 mL/min (A) (by C-G formula based on SCr of 1.88 mg/dL (H)).   Medical History: Past Medical History:  Diagnosis Date   Allergy    Chronic headaches    Diabetes mellitus without complication (HCC)    GERD (gastroesophageal reflux disease)    Heart burn    Hyperlipidemia    Hypertension     Medications:  Scheduled:   allopurinol  100 mg  Oral QHS   aspirin EC  81 mg Oral Daily   atorvastatin  10 mg Oral Daily   docusate sodium  100 mg Oral BID   ezetimibe  10 mg Oral Daily   insulin aspart  0-5 Units Subcutaneous QHS   insulin aspart  0-9 Units Subcutaneous TID WC   insulin glargine  6 Units Subcutaneous QHS   metoprolol tartrate  12.5 mg Oral BID   pantoprazole  40 mg Oral Daily   sodium chloride flush  3 mL Intravenous Q12H   Infusions:   sodium chloride     sodium chloride 75 mL/hr at 12/30/19 0547   sodium chloride Stopped (12/29/19 0500)   cefTRIAXone (ROCEPHIN)  IV Stopped (12/29/19 1859)   heparin 1,250 Units/hr (12/30/19 0348)    sodium bicarbonate (isotonic) infusion in sterile water 125 mL/hr at 12/30/19 0345    Assessment: 69 yo M to start Heparin drip for ACS/STEMI. Hgb 13.8  Plt 430  INR pending aPTT pending. No Anticoagulant PTA per Med Rec  Goal of Therapy:  Heparin level 0.3-0.7 units/ml Monitor platelets by anticoagulation protocol: Yes   1002 @ 0243 HL 0.75, slightly SUPRAtherapeutic.  CBC worse, likely dilutional.  Will decrease Heparin infusion to 1250 units/hr and recheck HL in 6 hours.  1002 @ 1126 HL 0.49, therapeutic x 1 1002 @ 1851 HL 0.53, therapeutic x 2 1003 @ 0026 HL 0.58, therapeutic x 3.  CBC stable 1004 @ 0458 HL 0.44,    Plan:  Heparin level is therapeutic. Will continue Heparin infusion 1250 units/hr  Continue to monitor CBC/HL daily and s/sx bleeding complications. Plan for cath 10/4.   Ronnald Ramp, PharmD, BCPS Clinical Pharmacist 12/30/2019 7:38 AM

## 2019-12-30 NOTE — Plan of Care (Signed)

## 2019-12-30 NOTE — Progress Notes (Addendum)
[facility]  ROUNDING NOTE   Subjective:  Underwent cardiac catheterization today.  Stent was placed. Renal function improved.  Creatinine down to 1.65.  Objective:  Vital signs in last 24 hours:  Temp:  [98.6 F (37 C)-98.7 F (37.1 C)] 98.7 F (37.1 C) (10/04 1405) Pulse Rate:  [60-82] 70 (10/04 1405) Resp:  [11-22] 17 (10/04 1405) BP: (126-150)/(66-84) 150/75 (10/04 1405) SpO2:  [93 %-99 %] 99 % (10/04 1405) Weight:  [103.1 kg] 103.1 kg (10/04 0424)  Weight change:  Filed Weights   12/27/19 1326 12/30/19 0424  Weight: 106.1 kg 103.1 kg    Intake/Output: I/O last 3 completed shifts: In: 4563.8 [P.O.:477; I.V.:3886.8; IV Piggyback:200] Out: 8841 [Urine:4645]   Intake/Output this shift:  Total I/O In: 400 [P.O.:400] Out: 750 [Urine:750]  Physical Exam: General:  No acute distress  Head:  Normocephalic, atraumatic. Moist oral mucosal membranes  Eyes:  Anicteric  Neck:  Supple  Lungs:   Clear to auscultation, normal effort  Heart:  S1S2 no rubs  Abdomen:   Soft, nontender, bowel sounds present  Extremities:  Trace peripheral edema.  Neurologic:  Awake, alert, following commands  Skin:  No lesions       Basic Metabolic Panel: Recent Labs  Lab 12/27/19 1329 12/27/19 1329 12/28/19 0308 12/29/19 0026 12/30/19 0502  NA 133*  --  136 137 141  K 6.9*  --  5.1 4.1 3.8  CL 109  --  105 99 98  CO2 15*  --  23 27 33*  GLUCOSE 182*  --  131* 167* 104*  BUN 39*  --  36* 32* 22  CREATININE 2.70*  --  2.26* 1.88* 1.65*  CALCIUM 10.2   < > 9.0 8.8* 8.4*   < > = values in this interval not displayed.    Liver Function Tests: Recent Labs  Lab 12/27/19 1329  AST 25  ALT 19  ALKPHOS 58  BILITOT 0.6  PROT 8.5*  ALBUMIN 4.3   No results for input(s): LIPASE, AMYLASE in the last 168 hours. No results for input(s): AMMONIA in the last 168 hours.  CBC: Recent Labs  Lab 12/27/19 1329 12/28/19 0308 12/29/19 0026 12/30/19 0458  WBC 7.0 5.3 6.3  5.2  HGB 13.8 11.0* 10.7* 10.1*  HCT 43.0 32.3* 31.8* 30.8*  MCV 90.5 87.1 88.6 90.1  PLT 430* 288 305 277    Cardiac Enzymes: Recent Labs  Lab 12/27/19 1329  CKTOTAL 160    BNP: Invalid input(s): POCBNP  CBG: Recent Labs  Lab 12/29/19 1622 12/29/19 2036 12/30/19 0732 12/30/19 1000 12/30/19 1306  GLUCAP 183* 220* 115* 118* 146*    Microbiology: Results for orders placed or performed during the hospital encounter of 12/27/19  Respiratory Panel by RT PCR (Flu A&B, Covid) - Nasopharyngeal Swab     Status: None   Collection Time: 12/27/19  3:44 PM   Specimen: Nasopharyngeal Swab  Result Value Ref Range Status   SARS Coronavirus 2 by RT PCR NEGATIVE NEGATIVE Final    Comment: (NOTE) SARS-CoV-2 target nucleic acids are NOT DETECTED.  The SARS-CoV-2 RNA is generally detectable in upper respiratoy specimens during the acute phase of infection. The lowest concentration of SARS-CoV-2 viral copies this assay can detect is 131 copies/mL. A negative result does not preclude SARS-Cov-2 infection and should not be used as the sole basis for treatment or other patient management decisions. A negative result may occur with  improper specimen collection/handling, submission of specimen other than nasopharyngeal swab,  presence of viral mutation(s) within the areas targeted by this assay, and inadequate number of viral copies (<131 copies/mL). A negative result must be combined with clinical observations, patient history, and epidemiological information. The expected result is Negative.  Fact Sheet for Patients:  PinkCheek.be  Fact Sheet for Healthcare Providers:  GravelBags.it  This test is no t yet approved or cleared by the [place] FDA and  has been authorized for detection and/or diagnosis of SARS-CoV-2 by FDA under an Emergency Use Authorization (EUA). This EUA will remain  in effect (meaning this test can be  used) for the duration of the COVID-19 declaration under Section 564(b)(1) of the Act, 21 U.S.C. section 360bbb-3(b)(1), unless the authorization is terminated or revoked sooner.     Influenza A by PCR NEGATIVE NEGATIVE Final   Influenza B by PCR NEGATIVE NEGATIVE Final    Comment: (NOTE) The Xpert Xpress SARS-CoV-2/FLU/RSV assay is intended as an aid in  the diagnosis of influenza from Nasopharyngeal swab specimens and  should not be used as a sole basis for treatment. Nasal washings and  aspirates are unacceptable for Xpert Xpress SARS-CoV-2/FLU/RSV  testing.  Fact Sheet for Patients: PinkCheek.be  Fact Sheet for Healthcare Providers: GravelBags.it  This test is not yet approved or cleared by the [place] FDA and  has been authorized for detection and/or diagnosis of SARS-CoV-2 by  FDA under an Emergency Use Authorization (EUA). This EUA will remain  in effect (meaning this test can be used) for the duration of the  Covid-19 declaration under Section 564(b)(1) of the Act, 21  U.S.C. section 360bbb-3(b)(1), unless the authorization is  terminated or revoked. Performed at [facility], [address] [place], [place] [address]     Coagulation Studies: Recent Labs    12/28/19 0243  LABPROT 13.2  INR 1.0    Urinalysis: No results for input(s): COLORURINE, LABSPEC, PHURINE, GLUCOSEU, HGBUR, BILIRUBINUR, KETONESUR, PROTEINUR, UROBILINOGEN, NITRITE, LEUKOCYTESUR in the last 72 hours.  Invalid input(s): APPERANCEUR    Imaging: CARDIAC CATHETERIZATION  Result Date: 12/30/2019  Prox RCA-1 lesion is 90% stenosed.  Prox RCA-2 lesion is 85% stenosed.  Mid RCA lesion is 30% stenosed.  Dist RCA lesion is 30% stenosed.  3rd Mrg lesion is 40% stenosed.  Prox LAD lesion is 40% stenosed.  Mid LAD lesion is 30% stenosed.  1st Diag lesion is 40% stenosed.  A drug-eluting stent was successfully placed  using a [place] [id number].  Post intervention, there is a 0% residual stenosis.  Post intervention, there is a 0% residual stenosis.  1.  Severe one-vessel coronary artery disease involving proximal right coronary artery.  There is also moderate LAD and left circumflex disease. 2.  Left ventricular angiography was not performed due to chronic kidney disease.  EF was normal by echo.  Mildly elevated left ventricular end-diastolic pressure around 20 mmHg. 3.  Successful angioplasty and drug-eluting stent placement to the proximal right coronary artery. Recommendations: Dual antiplatelet therapy for at least 1 year. Aggressive treatment of risk factors.  I increase atorvastatin to 40 mg daily to see if the patient can tolerate. I switch metoprolol to carvedilol for better blood pressure control. Hydrate overnight and check renal function in the morning.  75 mL of contrast was used.    ECHOCARDIOGRAM COMPLETE  Result Date: 12/30/2019    ECHOCARDIOGRAM REPORT   Patient Name:   [name] Date of Exam: 12/29/2019 Medical Rec #:  [id number]  Height:       71.0 in Accession #:    [id number]         Weight:       234.0 lb Date of Birth:  Dec 22, 1950           BSA:          2.254 m Patient Age:    61 years           BP:           132/72 mmHg Patient Gender: M                  HR:           73 bpm. Exam Location:  [facility] Procedure: 2D Echo and Intracardiac Opacification Agent Indications:     Chest Pain R07.9  History:         Patient has prior history of Echocardiogram examinations, most                  recent 12/06/2016.  Sonographer:     Arville Go RDCS Referring Phys:  [id number] [place] Diagnosing Phys: Buford Dresser MD  Sonographer Comments: Technically difficult study due to poor echo windows and Technically challenging study due to limited acoustic windows. Image acquisition challenging due to patient body habitus. IMPRESSIONS  1. Left ventricular ejection fraction, by estimation,  is 60 to 65%. The left ventricle has normal function. The left ventricle has no regional wall motion abnormalities. Left ventricular diastolic parameters were normal.  2. Right ventricular systolic function was not well visualized. The right ventricular size is not well visualized.  3. The mitral valve is grossly normal. Trivial mitral valve regurgitation. No evidence of mitral stenosis.  4. The aortic valve is grossly normal. Aortic valve regurgitation is not visualized. No aortic stenosis is present. Conclusion(s)/Recommendation(s): Technically challenging study, even with use of definity. Grossly normal EF and wall motion, but reduced sensitivity for focal abnormalities. FINDINGS  Left Ventricle: Left ventricular ejection fraction, by estimation, is 60 to 65%. The left ventricle has normal function. The left ventricle has no regional wall motion abnormalities. Definity contrast agent was given IV to delineate the left ventricular  endocardial borders. The left ventricular internal cavity size was normal in size. There is borderline left ventricular hypertrophy. Left ventricular diastolic parameters were normal. Right Ventricle: The right ventricular size is not well visualized. Right vetricular wall thickness was not well visualized. Right ventricular systolic function was not well visualized. Left Atrium: Left atrial size was normal in size. Right Atrium: Right atrial size was not well visualized. Pericardium: The pericardium was not well visualized. Mitral Valve: The mitral valve is grossly normal. Trivial mitral valve regurgitation. No evidence of mitral valve stenosis. Tricuspid Valve: The tricuspid valve is not well visualized. Tricuspid valve regurgitation is trivial. Aortic Valve: The aortic valve is grossly normal. There is mild aortic valve annular calcification. Aortic valve regurgitation is not visualized. No aortic stenosis is present. Aortic valve mean gradient measures 6.0 mmHg. Aortic valve peak  gradient measures 10.8 mmHg. Aortic valve area, by VTI measures 2.05 cm. Pulmonic Valve: The pulmonic valve was not well visualized. Pulmonic valve regurgitation is not visualized. Aorta: The aortic root and ascending aorta are structurally normal, with no evidence of dilitation. IAS/Shunts: The atrial septum is grossly normal.  LEFT VENTRICLE PLAX 2D LVIDd:         4.96 cm  Diastology LVIDs:         3.13 cm  LV e' medial:    7.18 cm/s LV PW:         1.17 cm  LV E/e' medial:  11.8 LV IVS:        1.22 cm  LV e' lateral:   10.20 cm/s LVOT diam:     2.00 cm  LV E/e' lateral: 8.3 LV SV:         71 LV SV Index:   31 LVOT Area:     3.14 cm  LEFT ATRIUM             Index LA diam:        3.00 cm 1.33 cm/m LA Vol (A2C):   33.0 ml 14.64 ml/m LA Vol (A4C):   26.2 ml 11.62 ml/m LA Biplane Vol: 29.6 ml 13.13 ml/m  AORTIC VALVE                    PULMONIC VALVE AV Area (Vmax):    2.07 cm     PV Vmax:       1.06 m/s AV Area (Vmean):   1.97 cm     PV Peak grad:  4.5 mmHg AV Area (VTI):     2.05 cm AV Vmax:           164.00 cm/s AV Vmean:          118.000 cm/s AV VTI:            0.346 m AV Peak Grad:      10.8 mmHg AV Mean Grad:      6.0 mmHg LVOT Vmax:         108.00 cm/s LVOT Vmean:        74.100 cm/s LVOT VTI:          0.226 m LVOT/AV VTI ratio: 0.65  AORTA Ao Root diam: 2.80 cm Ao Asc diam:  2.90 cm MITRAL VALVE MV Area (PHT): 2.80 cm    SHUNTS MV Decel Time: 271 msec    Systemic VTI:  0.23 m MV E velocity: 84.80 cm/s  Systemic Diam: 2.00 cm MV A velocity: 94.70 cm/s MV E/A ratio:  0.90 Buford Dresser MD Electronically signed by Buford Dresser MD Signature Date/Time: 12/30/2019/7:46:54 AM    Final   Patient's kidney function keep improving, creatinine 1.65 today down from 1.88.  Urine output adequate 2000 875 mm over the preceding 24 hours.  Patient appears in no acute distress no complaints of shortness of breath, nausea, or vomiting   Medications:   . sodium chloride Stopped (12/29/19 0500)  .  sodium chloride    . sodium chloride 1 mL/kg/hr (12/30/19 1314)  . cefTRIAXone (ROCEPHIN)  IV Stopped (12/29/19 1859)   . allopurinol  100 mg Oral QHS  . aspirin EC  81 mg Oral Daily  . [START ON 12/31/2019] atorvastatin  40 mg Oral Daily  . carvedilol  6.25 mg Oral BID WC  . docusate sodium  100 mg Oral BID  . ezetimibe  10 mg Oral Daily  . insulin aspart  0-5 Units Subcutaneous QHS  . insulin aspart  0-9 Units Subcutaneous TID WC  . insulin glargine  6 Units Subcutaneous QHS  . pantoprazole  40 mg Oral Daily  . sodium chloride flush  3 mL Intravenous Q12H  . ticagrelor  90 mg Oral BID   sodium chloride, acetaminophen **OR** acetaminophen, calcium carbonate, fluticasone, morphine injection, nitroGLYCERIN, ondansetron **OR** ondansetron (ZOFRAN) IV, sodium chloride flush  Assessment/ Plan:  69 y.o. male with past  medical history of diabetes mellitus type 2, hypertension, chronic kidney disease stage IIIb, history of knee surgery, recurrent E. coli UTI, elevated PSA, prostate biopsy 9/21 admitted with hyperkalemia.  1. Acute kidney injury/chronic kidney disease stage IIIb baseline creatinine 1.7/EGFR 38 Creatinine 1.65 Continue IV fluids for hydration, post cardiac catheterization Will continue monitoring renal function closely  2.  Hyperkalemia Potassium normalized to 3.8 today  3.  Diabetes mellitus type 2 with chronic kidney disease. Blood sugar readings are within acceptable range now Continue antihyperglycemic management as per medicine team  4.  NSTEMI Patient underwent cardiac catheterization today with stent placement to right coronary artery     LOS: 3 Princy Raju 10/4/20212:25 PM

## 2019-12-31 LAB — BASIC METABOLIC PANEL
Anion gap: 7 (ref 5–15)
BUN: 18 mg/dL (ref 8–23)
CO2: 27 mmol/L (ref 22–32)
Calcium: 8.7 mg/dL — ABNORMAL LOW (ref 8.9–10.3)
Chloride: 104 mmol/L (ref 98–111)
Creatinine, Ser: 1.59 mg/dL — ABNORMAL HIGH (ref 0.61–1.24)
GFR calc Af Amer: 51 mL/min — ABNORMAL LOW (ref >60)
GFR calc non Af Amer: 44 mL/min — ABNORMAL LOW (ref >60)
Glucose, Bld: 191 mg/dL — ABNORMAL HIGH (ref 70–99)
Potassium: 4 mmol/L (ref 3.5–5.1)
Sodium: 138 mmol/L (ref 135–145)

## 2019-12-31 LAB — CBC
HCT: 31.5 % — ABNORMAL LOW (ref 39.0–52.0)
Hemoglobin: 10.8 g/dL — ABNORMAL LOW (ref 13.0–17.0)
MCH: 30.1 pg (ref 26.0–34.0)
MCHC: 34.3 g/dL (ref 30.0–36.0)
MCV: 87.7 fL (ref 80.0–100.0)
Platelets: 252 10*3/uL (ref 150–400)
RBC: 3.59 MIL/uL — ABNORMAL LOW (ref 4.22–5.81)
RDW: 13.9 % (ref 11.5–15.5)
WBC: 6.2 10*3/uL (ref 4.0–10.5)
nRBC: 0 % (ref 0.0–0.2)

## 2019-12-31 LAB — GLUCOSE, CAPILLARY: Glucose-Capillary: 156 mg/dL — ABNORMAL HIGH (ref 70–99)

## 2019-12-31 MED ORDER — CARVEDILOL 6.25 MG PO TABS: 6.2500 mg | ORAL_TABLET | Freq: Two times a day (BID) | ORAL | 0 refills | Status: DC

## 2019-12-31 MED ORDER — EZETIMIBE 10 MG PO TABS
10.0000 mg | ORAL_TABLET | Freq: Every day | ORAL | 0 refills | Status: DC
Start: 2019-12-31 — End: 2020-01-07

## 2019-12-31 MED ORDER — NITROGLYCERIN 0.4 MG SL SUBL: 0.4000 mg | SUBLINGUAL_TABLET | SUBLINGUAL | 0 refills | Status: DC | PRN

## 2019-12-31 MED ORDER — ASPIRIN 81 MG PO TBEC
81.0000 mg | DELAYED_RELEASE_TABLET | Freq: Every day | ORAL | 0 refills | Status: DC
Start: 2019-12-31 — End: 2024-01-10

## 2019-12-31 MED ORDER — ATORVASTATIN CALCIUM 40 MG PO TABS
40.0000 mg | ORAL_TABLET | Freq: Every day | ORAL | 0 refills | Status: DC
Start: 2019-12-31 — End: 2020-01-07

## 2019-12-31 MED ORDER — BASAGLAR KWIKPEN 100 UNIT/ML ~~LOC~~ SOPN: 10.0000 [IU] | PEN_INJECTOR | Freq: Every day | SUBCUTANEOUS | Status: DC

## 2019-12-31 MED ORDER — TICAGRELOR 90 MG PO TABS: 90.0000 mg | ORAL_TABLET | Freq: Two times a day (BID) | ORAL | 0 refills | Status: DC

## 2019-12-31 MED ORDER — ALLOPURINOL 100 MG PO TABS: 100.0000 mg | ORAL_TABLET | Freq: Every day | ORAL | Status: DC

## 2019-12-31 NOTE — Plan of Care (Signed)
  Problem: Education: Goal: Knowledge of General Education information will improve Description: Including pain rating scale, medication(s)/side effects and non-pharmacologic comfort measures Outcome: Adequate for Discharge   

## 2019-12-31 NOTE — TOC Progression Note (Signed)
Transition of Care [facility]) - Progression Note    Patient Details  Name: [name] MRN: [id number] Date of Birth: 04-01-1950  [facility] CM/SW Contact  Shawn Route, RN Phone Number: 12/31/2019, 8:45 AM  Clinical Narrative:     Patient provided Tyson Dense.        Expected Discharge Plan and Services           Expected Discharge Date: 12/31/19                                     Social Determinants of Health (SDOH) Interventions    Readmission Risk Interventions No flowsheet data found.

## 2019-12-31 NOTE — Discharge Summary (Signed)
[facility] [place] [facility]   PATIENT NAME: [name]    MR#:  [id number]  DATE OF BIRTH:  Apr 22, 1950  DATE OF ADMISSION:  12/27/2019 ADMITTING PHYSICIAN: Alford Highland, MD  DATE OF DISCHARGE: 12/31/2019 11:42 AM  PRIMARY CARE PHYSICIAN: [name], DO    ADMISSION DIAGNOSIS:  Hyperkalemia [E87.5] Acute hyperkalemia [E87.5] Pain [R52] NSTEMI (non-ST elevated myocardial infarction) (HCC) [I21.4]  DISCHARGE DIAGNOSIS:  Active Problems:   Type 2 diabetes mellitus without complication, with long-term current use of insulin (HCC)   CKD stage 3 due to type 2 diabetes mellitus (HCC)   Acute hyperkalemia   NSTEMI (non-ST elevated myocardial infarction) (HCC)   SECONDARY DIAGNOSIS:   Past Medical History:  Diagnosis Date  . Allergy   . Chronic headaches   . Diabetes mellitus without complication (HCC)   . GERD (gastroesophageal reflux disease)   . Heart burn   . Hyperlipidemia   . Hypertension     HOSPITAL COURSE:   1.  Acute myocardial infarction.  The patient had a stent to the RCA by interventional cardiology yesterday.  Initially his troponin was 4 and then went up to 30.  I put the patient on aspirin, heparin drip and beta-blocker at that time.  His troponin the following day was 4164.  Troponin trended down to 1321.  Upon discharge the patient will be on aspirin, Brilinta, Coreg, atorvastatin and Zetia.  Echocardiogram showed a normal ejection fraction.  Cardiac rehab set up. 2.  Acute kidney injury on chronic kidney disease stage IIIa.  Creatinine was 2.7 upon admission.  Creatinine upon discharge 1.59.  Nephrology was okay restarting lisinopril.  Follow-up with nephrology as outpatient and recheck creatinine. 3.  Severe hyperkalemia of 6.9 on presentation likely secondary to Bactrim and acute kidney injury.  Potassium normalized upon disposition.  Nephrology okay to go back on lisinopril. 4.  Type 2 diabetes mellitus on chronic  kidney disease stage IIIa.  Continue low-dose [name] insulin as outpatient.  Hold Metformin since did have cardiac cath and acute kidney injury. 5.  UTI with E. coli after previous prostate biopsy.  Urology wanted antibiotics for a month.  Patient's antibiotics were switched from Bactrim over to Augmentin can go back on that.  Did get Rocephin while here in the hospital. 6.  History of gout on renally dosed allopurinol  DISCHARGE CONDITIONS:   Satisfactory  CONSULTS OBTAINED:  Treatment Team:  Antonieta Iba, MD  DRUG ALLERGIES:   Allergies  Allergen Reactions  . Crestor [Rosuvastatin Calcium] Other (See Comments)    myalgia  . Invokana [Canagliflozin] Other (See Comments)    dehydrated  . Prednisone Other (See Comments)    DISCHARGE MEDICATIONS:   Allergies as of 12/31/2019      Reactions   Crestor [rosuvastatin Calcium] Other (See Comments)   myalgia   Invokana [canagliflozin] Other (See Comments)   dehydrated   Prednisone Other (See Comments)      Medication List    STOP taking these medications   amLODipine 2.5 MG tablet Commonly known as: NORVASC   metFORMIN 750 MG 24 hr tablet Commonly known as: GLUCOPHAGE-XR     TAKE these medications   Accu-Chek SmartView test strip Generic drug: glucose blood Check sugar 2 times daily   Accu-Chek SmartView test strip Generic drug: glucose blood USE 1 STRIP TO CHECK GLUCOSE TWICE DAILY   acetaminophen 500 MG tablet Commonly known as: TYLENOL Take 1,000 mg by mouth every 8 (eight) hours as  needed.   allopurinol 100 MG tablet Commonly known as: ZYLOPRIM Take 1 tablet (100 mg total) by mouth at bedtime.   amoxicillin-clavulanate 875-125 MG tablet Commonly known as: Augmentin Take 1 tablet by mouth 2 (two) times daily for 10 days.   aspirin 81 MG EC tablet Take 1 tablet (81 mg total) by mouth daily. Swallow whole.   atorvastatin 40 MG tablet Commonly known as: LIPITOR Take 1 tablet (40 mg total) by mouth  daily.   [name] KwikPen 100 UNIT/ML Inject 10 Units into the skin at bedtime. What changed: how much to take   Bydureon BCise 2 MG/0.85ML Auij Generic drug: Exenatide ER INJECT 2 MG SUBCUTANEOUSLY ONCE A WEEK What changed: See the new instructions.   calcium carbonate 500 MG chewable tablet Commonly known as: TUMS - dosed in mg elemental calcium Chew 2 tablets by mouth at bedtime as needed for indigestion or heartburn.   carvedilol 6.25 MG tablet Commonly known as: COREG Take 1 tablet (6.25 mg total) by mouth 2 (two) times daily with a meal.   colchicine 0.6 MG tablet Take 1 tablet (0.6 mg total) by mouth as needed (gout flare).   docusate sodium 100 MG capsule Commonly known as: COLACE Take 100 mg by mouth 2 (two) times daily.   ezetimibe 10 MG tablet Commonly known as: ZETIA Take 1 tablet (10 mg total) by mouth daily.   fluticasone 50 MCG/ACT nasal spray Commonly known as: FLONASE USE 2 SPRAYS IN EACH NOSTRIL ONCE DAILY USE FOR 4 TO 6 WEEKS, THEN STOP AND USE SEASONALLY OR AS NEEDED.   Insulin Pen Needle 32G X 4 MM Misc Commonly known as: NovoFine Plus Use with Lantus insulin daily injection as instructed   lisinopril 40 MG tablet Commonly known as: ZESTRIL TAKE 1 TABLET EVERY DAY   nitroGLYCERIN 0.4 MG SL tablet Commonly known as: NITROSTAT Place 1 tablet (0.4 mg total) under the tongue every 5 (five) minutes as needed for chest pain.   omeprazole 20 MG capsule Commonly known as: PRILOSEC TAKE 1 CAPSULE EVERY DAY   ticagrelor 90 MG Tabs tablet Commonly known as: BRILINTA Take 1 tablet (90 mg total) by mouth 2 (two) times daily.        DISCHARGE INSTRUCTIONS:   Follow-up PMD 5 days Follow-up cardiology 1 week Follow-up nephrology 1 week Follow-up cardiac rehab  If you experience worsening of your admission symptoms, develop shortness of breath, life threatening emergency, suicidal or homicidal thoughts you must seek medical attention immediately by  calling 911 or calling your MD immediately  if symptoms less severe.  You Must read complete instructions/literature along with all the possible adverse reactions/side effects for all the Medicines you take and that have been prescribed to you. Take any new Medicines after you have completely understood and accept all the possible adverse reactions/side effects.   Please note  You were cared for by a hospitalist during your hospital stay. If you have any questions about your discharge medications or the care you received while you were in the hospital after you are discharged, you can call the unit and asked to speak with the hospitalist on call if the hospitalist that took care of you is not available. Once you are discharged, your primary care physician will handle any further medical issues. Please note that NO REFILLS for any discharge medications will be authorized once you are discharged, as it is imperative that you return to your primary care physician (or establish a relationship with a primary  care physician if you do not have one) for your aftercare needs so that they can reassess your need for medications and monitor your lab values.    Today   CHIEF COMPLAINT:   Chief Complaint  Patient presents with  . Chest Pain    HISTORY OF PRESENT ILLNESS:  [name]  is a 69 y.o. male came in with chest pain   VITAL SIGNS:  Blood pressure (!) 133/94, pulse 79, temperature 99 F (37.2 C), temperature source Oral, resp. rate 18, height 5\' 11"  (1.803 m), weight 104.3 kg, SpO2 98 %.  I/O:    Intake/Output Summary (Last 24 hours) at 12/31/2019 1638 Last data filed at 12/31/2019 1033 Gross per 24 hour  Intake 733 ml  Output 2000 ml  Net -1267 ml    PHYSICAL EXAMINATION:  GENERAL:  69 y.o.-year-old patient lying in the bed with no acute distress.  EYES: Pupils equal, round, reactive to light and accommodation. No scleral icterus.  HEENT: Head atraumatic, normocephalic. Oropharynx  and nasopharynx clear.  LUNGS: Normal breath sounds bilaterally, no wheezing, rales,rhonchi or crepitation. No use of accessory muscles of respiration.  CARDIOVASCULAR: S1, S2 normal. No murmurs, rubs, or gallops.  ABDOMEN: Soft, non-tender.  EXTREMITIES: No pedal edema.  NEUROLOGIC: Cranial nerves II through XII are intact. Muscle strength 5/5 in all extremities. Sensation intact. Gait not checked.  PSYCHIATRIC: The patient is alert and oriented x 3.  SKIN: No obvious rash, lesion, or ulcer.   DATA REVIEW:   CBC Recent Labs  Lab 12/31/19 0619  WBC 6.2  HGB 10.8*  HCT 31.5*  PLT 252    Chemistries  Recent Labs  Lab 12/27/19 1329 12/28/19 0308 12/31/19 0619  NA 133*   < > 138  K 6.9*   < > 4.0  CL 109   < > 104  CO2 15*   < > 27  GLUCOSE 182*   < > 191*  BUN 39*   < > 18  CREATININE 2.70*   < > 1.59*  CALCIUM 10.2   < > 8.7*  AST 25  --   --   ALT 19  --   --   ALKPHOS 58  --   --   BILITOT 0.6  --   --    < > = values in this interval not displayed.     Microbiology Results  Results for orders placed or performed during the hospital encounter of 12/27/19  Respiratory Panel by RT PCR (Flu A&B, Covid) - Nasopharyngeal Swab     Status: None   Collection Time: 12/27/19  3:44 PM   Specimen: Nasopharyngeal Swab  Result Value Ref Range Status   SARS Coronavirus 2 by RT PCR NEGATIVE NEGATIVE Final    Comment: (NOTE) SARS-CoV-2 target nucleic acids are NOT DETECTED.  The SARS-CoV-2 RNA is generally detectable in upper respiratoy specimens during the acute phase of infection. The lowest concentration of SARS-CoV-2 viral copies this assay can detect is 131 copies/mL. A negative result does not preclude SARS-Cov-2 infection and should not be used as the sole basis for treatment or other patient management decisions. A negative result may occur with  improper specimen collection/handling, submission of specimen other than nasopharyngeal swab, presence of viral  mutation(s) within the areas targeted by this assay, and inadequate number of viral copies (<131 copies/mL). A negative result must be combined with clinical observations, patient history, and epidemiological information. The expected result is Negative.  Fact Sheet for Patients:  https://www.moore.com/https://www.fda.gov/media/142436/download  Fact Sheet for Healthcare Providers:  https://www.young.biz/  This test is no t yet approved or cleared by the [place] FDA and  has been authorized for detection and/or diagnosis of SARS-CoV-2 by FDA under an Emergency Use Authorization (EUA). This EUA will remain  in effect (meaning this test can be used) for the duration of the COVID-19 declaration under Section 564(b)(1) of the Act, 21 U.S.C. section 360bbb-3(b)(1), unless the authorization is terminated or revoked sooner.     Influenza A by PCR NEGATIVE NEGATIVE Final   Influenza B by PCR NEGATIVE NEGATIVE Final    Comment: (NOTE) The Xpert Xpress SARS-CoV-2/FLU/RSV assay is intended as an aid in  the diagnosis of influenza from Nasopharyngeal swab specimens and  should not be used as a sole basis for treatment. Nasal washings and  aspirates are unacceptable for Xpert Xpress SARS-CoV-2/FLU/RSV  testing.  Fact Sheet for Patients: https://www.moore.com/  Fact Sheet for Healthcare Providers: https://www.young.biz/  This test is not yet approved or cleared by the [place] FDA and  has been authorized for detection and/or diagnosis of SARS-CoV-2 by  FDA under an Emergency Use Authorization (EUA). This EUA will remain  in effect (meaning this test can be used) for the duration of the  Covid-19 declaration under Section 564(b)(1) of the Act, 21  U.S.C. section 360bbb-3(b)(1), unless the authorization is  terminated or revoked. Performed at [facility], [address] [place], [place] [address]     RADIOLOGY:  CARDIAC  CATHETERIZATION  Result Date: 12/30/2019  Prox RCA-1 lesion is 90% stenosed.  Prox RCA-2 lesion is 85% stenosed.  Mid RCA lesion is 30% stenosed.  Dist RCA lesion is 30% stenosed.  3rd Mrg lesion is 40% stenosed.  Prox LAD lesion is 40% stenosed.  Mid LAD lesion is 30% stenosed.  1st Diag lesion is 40% stenosed.  A drug-eluting stent was successfully placed using a STENT RESOLUTE ONYX [id number].  Post intervention, there is a 0% residual stenosis.  Post intervention, there is a 0% residual stenosis.  1.  Severe one-vessel coronary artery disease involving proximal right coronary artery.  There is also moderate LAD and left circumflex disease. 2.  Left ventricular angiography was not performed due to chronic kidney disease.  EF was normal by echo.  Mildly elevated left ventricular end-diastolic pressure around 20 mmHg. 3.  Successful angioplasty and drug-eluting stent placement to the proximal right coronary artery. Recommendations: Dual antiplatelet therapy for at least 1 year. Aggressive treatment of risk factors.  I increase atorvastatin to 40 mg daily to see if the patient can tolerate. I switch metoprolol to carvedilol for better blood pressure control. Hydrate overnight and check renal function in the morning.  75 mL of contrast was used.      Management plans discussed with the patient, family and they are in agreement.  CODE STATUS:     Code Status Orders  (From admission, onward)         Start     Ordered   12/27/19 1532  Full code  Continuous        12/27/19 1532        Code Status History    Date Active Date Inactive Code Status Order ID Comments User Context   12/06/2016 0105 12/06/2016 2127 Full Code [id number]  Oralia Manis, MD Inpatient   Advance Care Planning Activity      TOTAL TIME TAKING CARE OF THIS PATIENT: 34 minutes.    Alford Highland M.D on 12/31/2019 at 4:38  PM  Between 7am to 6pm - Pager - [phone]  After 6pm go to [email or web address] - password [email or web address]  [facility]  [facility]  CC: Primary care physician; [name], DO

## 2019-12-31 NOTE — Progress Notes (Signed)
[facility]  ROUNDING NOTE   Subjective:  Patient patient in no acute distress, sitting up in bed, wife at the bedside.  Patient and family had a lot of questions about kidney function and follow-up.  He denies shortness of breath, chest pain, nausea, vomiting or dizziness.  He is awaiting discharge this morning.  Objective:  Vital signs in last 24 hours:  Temp:  [97.6 F (36.4 C)-99.4 F (37.4 C)] 99 F (37.2 C) (10/05 0732) Pulse Rate:  [78-82] 79 (10/05 0732) Resp:  [17-18] 18 (10/05 0732) BP: (133-162)/(65-94) 133/94 (10/05 0732) SpO2:  [98 %] 98 % (10/05 0732) Weight:  [104.3 kg] 104.3 kg (10/05 0417)  Weight change: 1.27 kg Filed Weights   12/27/19 1326 12/30/19 0424 12/31/19 0417  Weight: 106.1 kg 103.1 kg 104.3 kg    Intake/Output: I/O last 3 completed shifts: In: 2386.6 [P.O.:1080; I.V.:1206.6; IV Piggyback:100] Out: 3875 [Urine:3875]   Intake/Output this shift:  Total I/O In: 360 [P.O.:360] Out: 650 [Urine:650]  Physical Exam: General:  No acute distress  Head:  Moist oral mucosal membranes  Eyes:  Anicteric  Neck:  Supple  Lungs:   Normal and symmetrical respiratory effort, lungs clear  Heart:  Regular S1S2 no rubs  Abdomen:   Soft, nontender, bowel sounds present  Extremities:  No peripheral edema.  Neurologic:  Awake, alert, oriented  Skin:  No rashes or lesions       Basic Metabolic Panel: Recent Labs  Lab 12/27/19 1329 12/27/19 1329 12/28/19 0308 12/28/19 0308 12/29/19 0026 12/30/19 0502 12/31/19 0619  NA 133*  --  136  --  137 141 138  K 6.9*  --  5.1  --  4.1 3.8 4.0  CL 109  --  105  --  99 98 104  CO2 15*  --  23  --  27 33* 27  GLUCOSE 182*  --  131*  --  167* 104* 191*  BUN 39*  --  36*  --  32* 22 18  CREATININE 2.70*  --  2.26*  --  1.88* 1.65* 1.59*  CALCIUM 10.2   < > 9.0   < > 8.8* 8.4* 8.7*   < > = values in this interval not displayed.    Liver Function Tests: Recent Labs  Lab 12/27/19 1329  AST 25   ALT 19  ALKPHOS 58  BILITOT 0.6  PROT 8.5*  ALBUMIN 4.3   No results for input(s): LIPASE, AMYLASE in the last 168 hours. No results for input(s): AMMONIA in the last 168 hours.  CBC: Recent Labs  Lab 12/27/19 1329 12/28/19 0308 12/29/19 0026 12/30/19 0458 12/31/19 0619  WBC 7.0 5.3 6.3 5.2 6.2  HGB 13.8 11.0* 10.7* 10.1* 10.8*  HCT 43.0 32.3* 31.8* 30.8* 31.5*  MCV 90.5 87.1 88.6 90.1 87.7  PLT 430* 288 305 277 252    Cardiac Enzymes: Recent Labs  Lab 12/27/19 1329  CKTOTAL 160    BNP: Invalid input(s): POCBNP  CBG: Recent Labs  Lab 12/30/19 1000 12/30/19 1306 12/30/19 1637 12/30/19 2056 12/31/19 0733  GLUCAP 118* 146* 190* 199* 156*    Microbiology: Results for orders placed or performed during the hospital encounter of 12/27/19  Respiratory Panel by RT PCR (Flu A&B, Covid) - Nasopharyngeal Swab     Status: None   Collection Time: 12/27/19  3:44 PM   Specimen: Nasopharyngeal Swab  Result Value Ref Range Status   SARS Coronavirus 2 by RT PCR NEGATIVE NEGATIVE Final  Comment: (NOTE) SARS-CoV-2 target nucleic acids are NOT DETECTED.  The SARS-CoV-2 RNA is generally detectable in upper respiratoy specimens during the acute phase of infection. The lowest concentration of SARS-CoV-2 viral copies this assay can detect is 131 copies/mL. A negative result does not preclude SARS-Cov-2 infection and should not be used as the sole basis for treatment or other patient management decisions. A negative result may occur with  improper specimen collection/handling, submission of specimen other than nasopharyngeal swab, presence of viral mutation(s) within the areas targeted by this assay, and inadequate number of viral copies (<131 copies/mL). A negative result must be combined with clinical observations, patient history, and epidemiological information. The expected result is Negative.  Fact Sheet for Patients:   PinkCheek.be  Fact Sheet for Healthcare Providers:  GravelBags.it  This test is no t yet approved or cleared by the [place] FDA and  has been authorized for detection and/or diagnosis of SARS-CoV-2 by FDA under an Emergency Use Authorization (EUA). This EUA will remain  in effect (meaning this test can be used) for the duration of the COVID-19 declaration under Section 564(b)(1) of the Act, 21 U.S.C. section 360bbb-3(b)(1), unless the authorization is terminated or revoked sooner.     Influenza A by PCR NEGATIVE NEGATIVE Final   Influenza B by PCR NEGATIVE NEGATIVE Final    Comment: (NOTE) The Xpert Xpress SARS-CoV-2/FLU/RSV assay is intended as an aid in  the diagnosis of influenza from Nasopharyngeal swab specimens and  should not be used as a sole basis for treatment. Nasal washings and  aspirates are unacceptable for Xpert Xpress SARS-CoV-2/FLU/RSV  testing.  Fact Sheet for Patients: PinkCheek.be  Fact Sheet for Healthcare Providers: GravelBags.it  This test is not yet approved or cleared by the [place] FDA and  has been authorized for detection and/or diagnosis of SARS-CoV-2 by  FDA under an Emergency Use Authorization (EUA). This EUA will remain  in effect (meaning this test can be used) for the duration of the  Covid-19 declaration under Section 564(b)(1) of the Act, 21  U.S.C. section 360bbb-3(b)(1), unless the authorization is  terminated or revoked. Performed at [facility], [address] [place], [place] [address]     Coagulation Studies: No results for input(s): LABPROT, INR in the last 72 hours.  Urinalysis: No results for input(s): COLORURINE, LABSPEC, PHURINE, GLUCOSEU, HGBUR, BILIRUBINUR, KETONESUR, PROTEINUR, UROBILINOGEN, NITRITE, LEUKOCYTESUR in the last 72 hours.  Invalid input(s): APPERANCEUR     Imaging: CARDIAC CATHETERIZATION  Result Date: 12/30/2019  Prox RCA-1 lesion is 90% stenosed.  Prox RCA-2 lesion is 85% stenosed.  Mid RCA lesion is 30% stenosed.  Dist RCA lesion is 30% stenosed.  3rd Mrg lesion is 40% stenosed.  Prox LAD lesion is 40% stenosed.  Mid LAD lesion is 30% stenosed.  1st Diag lesion is 40% stenosed.  A drug-eluting stent was successfully placed using a [place] [id number].  Post intervention, there is a 0% residual stenosis.  Post intervention, there is a 0% residual stenosis.  1.  Severe one-vessel coronary artery disease involving proximal right coronary artery.  There is also moderate LAD and left circumflex disease. 2.  Left ventricular angiography was not performed due to chronic kidney disease.  EF was normal by echo.  Mildly elevated left ventricular end-diastolic pressure around 20 mmHg. 3.  Successful angioplasty and drug-eluting stent placement to the proximal right coronary artery. Recommendations: Dual antiplatelet therapy for at least 1 year. Aggressive treatment of risk factors.  I increase  atorvastatin to 40 mg daily to see if the patient can tolerate. I switch metoprolol to carvedilol for better blood pressure control. Hydrate overnight and check renal function in the morning.  75 mL of contrast was used.   Patient's kidney function keep improving, creatinine 1.65 today down from 1.88.  Urine output adequate 2000 875 mm over the preceding 24 hours.  Patient appears in no acute distress no complaints of shortness of breath, nausea, or vomiting   Medications:   . sodium chloride Stopped (12/29/19 0500)  . sodium chloride    . cefTRIAXone (ROCEPHIN)  IV 1 g (12/30/19 1645)   . allopurinol  100 mg Oral QHS  . aspirin EC  81 mg Oral Daily  . atorvastatin  40 mg Oral Daily  . carvedilol  6.25 mg Oral BID WC  . docusate sodium  100 mg Oral BID  . ezetimibe  10 mg Oral Daily  . insulin aspart  0-5 Units Subcutaneous QHS  . insulin  aspart  0-9 Units Subcutaneous TID WC  . insulin glargine  6 Units Subcutaneous QHS  . pantoprazole  40 mg Oral Daily  . sodium chloride flush  3 mL Intravenous Q12H  . ticagrelor  90 mg Oral BID   sodium chloride, acetaminophen **OR** acetaminophen, calcium carbonate, fluticasone, nitroGLYCERIN, ondansetron **OR** ondansetron (ZOFRAN) IV, sodium chloride flush  Assessment/ Plan:  69 y.o. male with past medical history of diabetes mellitus type 2, hypertension, chronic kidney disease stage IIIb, history of knee surgery, recurrent E. coli UTI, elevated PSA, prostate biopsy 9/21 admitted with hyperkalemia.  1. Acute kidney injury/chronic kidney disease stage IIIb baseline creatinine 1.7/EGFR 38 Creatinine trending down 1.59 today Will follow as outpatient if getting discharged today  2.  Hyperkalemia Potassium normalized to 4.0  3.  Diabetes mellitus type 2 with chronic kidney disease. Blood sugar readings are within acceptable range now Continue antihyperglycemic agents as outpatient   4.  NSTEMI Cardiac catheterization yesterday with stent to RCA No ACS symptoms today Management per cardiology team     LOS: 4 Sayre Mazor 10/5/20212:06 PM

## 2019-12-31 NOTE — Progress Notes (Signed)
Progress Note  Patient Name: [name] Date of Encounter: 12/31/2019  Primary Cardiologist: Agbor-Etang  Subjective   Status post PCI/DES to the RCA on 10/4 with no further chest pain. SOme soreness of the right radial cardiac cath site. No dyspnea, palpitations, dizziness, presyncope, or syncope. He has ambulated without issues. Post cath labs are stable. Chewing tobacco is noted.   Inpatient Medications    Scheduled Meds: . allopurinol  100 mg Oral QHS  . aspirin EC  81 mg Oral Daily  . atorvastatin  40 mg Oral Daily  . carvedilol  6.25 mg Oral BID WC  . docusate sodium  100 mg Oral BID  . ezetimibe  10 mg Oral Daily  . insulin aspart  0-5 Units Subcutaneous QHS  . insulin aspart  0-9 Units Subcutaneous TID WC  . insulin glargine  6 Units Subcutaneous QHS  . pantoprazole  40 mg Oral Daily  . sodium chloride flush  3 mL Intravenous Q12H  . ticagrelor  90 mg Oral BID   Continuous Infusions: . sodium chloride Stopped (12/29/19 0500)  . sodium chloride    . cefTRIAXone (ROCEPHIN)  IV 1 g (12/30/19 1645)   PRN Meds: sodium chloride, acetaminophen **OR** acetaminophen, calcium carbonate, fluticasone, nitroGLYCERIN, ondansetron **OR** ondansetron (ZOFRAN) IV, sodium chloride flush   Vital Signs    Vitals:   12/30/19 1524 12/30/19 1925 12/31/19 0417 12/31/19 0732  BP: (!) 144/71 134/65 (!) 162/80 (!) 133/94  Pulse: 78 82 78 79  Resp: 17   18  Temp: 97.6 F (36.4 C) 99.4 F (37.4 C) 98.6 F (37 C) 99 F (37.2 C)  TempSrc:  Oral Oral Oral  SpO2: 98% 98% 98% 98%  Weight:   104.3 kg   Height:        Intake/Output Summary (Last 24 hours) at 12/31/2019 0810 Last data filed at 12/31/2019 0737 Gross per 24 hour  Intake 1155.14 ml  Output 2500 ml  Net -1344.86 ml   Filed Weights   12/27/19 1326 12/30/19 0424 12/31/19 0417  Weight: 106.1 kg 103.1 kg 104.3 kg    Telemetry    SR with 1st degree AV block - Personally Reviewed  ECG    NSR, 79 bpm, left  axis deviation, poor R wave progression along the precordial leads, no acute st/t changes - Personally Reviewed  Physical Exam   GEN: No acute distress.   Neck: No JVD. Cardiac: RRR, no murmurs, rubs, or gallops. Right radial cardiac cath site is well healing with mild TTP, no bleeding bruising, swelling, warmth, or erythema. Radial pulse 2+.  Respiratory: Clear to auscultation bilaterally.  GI: Soft, nontender, non-distended.   MS: No edema; No deformity. Neuro:  Alert and oriented x 3; Nonfocal.  Psych: Normal affect.  Labs    Chemistry Recent Labs  Lab 12/27/19 1329 12/28/19 0308 12/29/19 0026 12/30/19 0502 12/31/19 0619  NA 133*   < > 137 141 138  K 6.9*   < > 4.1 3.8 4.0  CL 109   < > 99 98 104  CO2 15*   < > 27 33* 27  GLUCOSE 182*   < > 167* 104* 191*  BUN 39*   < > 32* 22 18  CREATININE 2.70*   < > 1.88* 1.65* 1.59*  CALCIUM 10.2   < > 8.8* 8.4* 8.7*  PROT 8.5*  --   --   --   --   ALBUMIN 4.3  --   --   --   --  AST 25  --   --   --   --   ALT 19  --   --   --   --   ALKPHOS 58  --   --   --   --   BILITOT 0.6  --   --   --   --   GFRNONAA 23*   < > 36* 42* 44*  GFRAA 27*   < > 42* 49* 51*  ANIONGAP 9   < > 11 10 7    < > = values in this interval not displayed.     Hematology Recent Labs  Lab 12/29/19 0026 12/30/19 0458 12/31/19 0619  WBC 6.3 5.2 6.2  RBC 3.59* 3.42* 3.59*  HGB 10.7* 10.1* 10.8*  HCT 31.8* 30.8* 31.5*  MCV 88.6 90.1 87.7  MCH 29.8 29.5 30.1  MCHC 33.6 32.8 34.3  RDW 14.1 13.9 13.9  PLT 305 277 252    Cardiac EnzymesNo results for input(s): TROPONINI in the last 168 hours. No results for input(s): TROPIPOC in the last 168 hours.   BNPNo results for input(s): BNP, PROBNP in the last 168 hours.   DDimer No results for input(s): DDIMER in the last 168 hours.   Radiology     Cardiac Studies   2D echo 12/29/2019: 1. Left ventricular ejection fraction, by estimation, is 60 to 65%. The  left ventricle has normal function. The  left ventricle has no regional  wall motion abnormalities. Left ventricular diastolic parameters were  normal.  2. Right ventricular systolic function was not well visualized. The right  ventricular size is not well visualized.  3. The mitral valve is grossly normal. Trivial mitral valve  regurgitation. No evidence of mitral stenosis.  4. The aortic valve is grossly normal. Aortic valve regurgitation is not  visualized. No aortic stenosis is present.  __________   LHC 12/30/2019: Prox RCA-1 lesion is 90% stenosed.  Prox RCA-2 lesion is 85% stenosed.  Mid RCA lesion is 30% stenosed.  Dist RCA lesion is 30% stenosed.  3rd Mrg lesion is 40% stenosed.  Prox LAD lesion is 40% stenosed.  Mid LAD lesion is 30% stenosed.  1st Diag lesion is 40% stenosed.  A drug-eluting stent was successfully placed using a STENT RESOLUTE ONYX 02/29/2020.  Post intervention, there is a 0% residual stenosis.  Post intervention, there is a 0% residual stenosis.   1.  Severe one-vessel coronary artery disease involving proximal right coronary artery.  There is also moderate LAD and left circumflex disease. 2.  Left ventricular angiography was not performed due to chronic kidney disease.  EF was normal by echo.  Mildly elevated left ventricular end-diastolic pressure around 20 mmHg. 3.  Successful angioplasty and drug-eluting stent placement to the proximal right coronary artery.  Recommendations: Dual antiplatelet therapy for at least 1 year. Aggressive treatment of risk factors.  I increase atorvastatin to 40 mg daily to see if the patient can tolerate. I switch metoprolol to carvedilol for better blood pressure control. Hydrate overnight and check renal function in the morning.  75 mL of contrast was used.     Patient Profile     69 y.o. male with history of  prior PE treated with Eliquis, DM2, CKD stage III, HTN, HLD, spinal stenosis, and GERD who is being seen today for the evaluation of  NSTEMI.   Assessment & Plan    1. NSTEMI: -No further chest pain -Status post PCI/DES to the RCA with residual disease involving the  LAD and LCx to be medically managed -Continue DAPT with ASA and Brilinta without interruption for at least the next 12 months -Please ensure he has a Briltina card prior to discharge -Lipitor, Zetia (see below regarding statin tolerance) -Post cath instructions -Cardiac rehab -Recommend aggressive treatment of risk factors including cessation of chewing tobacco   2. Acute on CKD stage III: -Improving and stable post cath -He remains off lisinopril at this time, consider resuming as outpatient  3. Hyperkalemia: -Resolved -Likely in the setting of AKI  4. HTN: -Blood pressure is reasonably controlled -Continue Coreg  5. HLD: -LDL 81 -Previously intolerant to high-intensity statins -So far, tolerating Lipitor 40 mg and Zetia -If his LDL remains above goal of < 70 and he cannot tolerate titration of statins would need to consider PCSK-9 inhibitor   Ok for discharge today on current medications. Follow up in our office in 7-10 days. I will arrange this.   For questions or updates, please contact [facility] Please consult [email or web address] for contact info under Cardiology/STEMI.    Signed, Eula Listen, PA-C [facility] Pager: [phone] 12/31/2019, 8:10 AM

## 2020-01-02 ENCOUNTER — Telehealth: Payer: Self-pay

## 2020-01-02 ENCOUNTER — Other Ambulatory Visit: Payer: Self-pay | Admitting: Family Medicine

## 2020-01-02 DIAGNOSIS — K219 Gastro-esophageal reflux disease without esophagitis: Secondary | ICD-10-CM

## 2020-01-02 DIAGNOSIS — I10 Essential (primary) hypertension: Secondary | ICD-10-CM

## 2020-01-02 NOTE — Telephone Encounter (Signed)
Transition Care Management Follow-up Telephone Call  Date of discharge and from where:  [facility] 12/31/2019  How have you been since you were released from the hospital? Still weak  Any questions or concerns? No  Items Reviewed:  Did the pt receive and understand the discharge instructions provided? Yes   Medications obtained and verified? Yes   Any new allergies since your discharge? No   Dietary orders reviewed? Yes  Do you have support at home? Yes   Functional Questionnaire: (I = Independent and D = Dependent) ADLs: I  Bathing/Dressing- I  Meal Prep- I  Eating- I  Maintaining continence- I  Transferring/Ambulation- I  Managing Meds- I  Follow up appointments reviewed:   PCP Hospital f/u appt confirmed? Yes  Scheduled to see Dr. Althea Charon on 01/07/2020 @ 3:40p..  Are transportation arrangements needed? No   If their condition worsens, is the pt aware to call PCP or go to the Emergency Dept.? Yes  Was the patient provided with contact information for the PCP's office or ED? Yes  Was to pt encouraged to call back with questions or concerns? Yes

## 2020-01-06 ENCOUNTER — Telehealth: Payer: Self-pay

## 2020-01-07 ENCOUNTER — Other Ambulatory Visit: Payer: Self-pay

## 2020-01-07 ENCOUNTER — Ambulatory Visit (INDEPENDENT_AMBULATORY_CARE_PROVIDER_SITE_OTHER): Payer: Medicare HMO | Admitting: Family Medicine

## 2020-01-07 ENCOUNTER — Encounter: Payer: Self-pay | Admitting: Family Medicine

## 2020-01-07 VITALS — BP 138/70 | HR 74 | Temp 97.7°F | Resp 16 | Ht 71.0 in | Wt 231.8 lb

## 2020-01-07 DIAGNOSIS — E1169 Type 2 diabetes mellitus with other specified complication: Secondary | ICD-10-CM | POA: Diagnosis not present

## 2020-01-07 DIAGNOSIS — E1121 Type 2 diabetes mellitus with diabetic nephropathy: Secondary | ICD-10-CM | POA: Diagnosis not present

## 2020-01-07 DIAGNOSIS — I10 Essential (primary) hypertension: Secondary | ICD-10-CM | POA: Diagnosis not present

## 2020-01-07 DIAGNOSIS — I252 Old myocardial infarction: Secondary | ICD-10-CM

## 2020-01-07 DIAGNOSIS — E1165 Type 2 diabetes mellitus with hyperglycemia: Secondary | ICD-10-CM

## 2020-01-07 DIAGNOSIS — IMO0002 Reserved for concepts with insufficient information to code with codable children: Secondary | ICD-10-CM

## 2020-01-07 DIAGNOSIS — Z794 Long term (current) use of insulin: Secondary | ICD-10-CM

## 2020-01-07 DIAGNOSIS — K219 Gastro-esophageal reflux disease without esophagitis: Secondary | ICD-10-CM | POA: Diagnosis not present

## 2020-01-07 DIAGNOSIS — M1000 Idiopathic gout, unspecified site: Secondary | ICD-10-CM

## 2020-01-07 DIAGNOSIS — E785 Hyperlipidemia, unspecified: Secondary | ICD-10-CM

## 2020-01-07 DIAGNOSIS — N183 Chronic kidney disease, stage 3 unspecified: Secondary | ICD-10-CM

## 2020-01-07 DIAGNOSIS — I129 Hypertensive chronic kidney disease with stage 1 through stage 4 chronic kidney disease, or unspecified chronic kidney disease: Secondary | ICD-10-CM

## 2020-01-07 MED ORDER — TICAGRELOR 90 MG PO TABS: 90.0000 mg | ORAL_TABLET | Freq: Two times a day (BID) | ORAL | 1 refills | Status: DC

## 2020-01-07 MED ORDER — CARVEDILOL 6.25 MG PO TABS: 6.2500 mg | ORAL_TABLET | Freq: Two times a day (BID) | ORAL | 1 refills | Status: DC

## 2020-01-07 MED ORDER — ACCU-CHEK SMARTVIEW VI STRP: ORAL_STRIP | 12 refills | Status: DC

## 2020-01-07 MED ORDER — LISINOPRIL 40 MG PO TABS: 40.0000 mg | ORAL_TABLET | Freq: Every day | ORAL | 3 refills | Status: DC

## 2020-01-07 MED ORDER — OMEPRAZOLE 20 MG PO CPDR: 20.0000 mg | DELAYED_RELEASE_CAPSULE | Freq: Every day | ORAL | 3 refills | Status: DC

## 2020-01-07 MED ORDER — ALLOPURINOL 100 MG PO TABS: 100.0000 mg | ORAL_TABLET | Freq: Every day | ORAL | 3 refills | Status: DC

## 2020-01-07 MED ORDER — INSULIN PEN NEEDLE 32G X 4 MM MISC: 3 refills | Status: DC

## 2020-01-07 MED ORDER — EZETIMIBE 10 MG PO TABS: 10.0000 mg | ORAL_TABLET | Freq: Every day | ORAL | 1 refills | Status: DC

## 2020-01-07 MED ORDER — ATORVASTATIN CALCIUM 40 MG PO TABS: 40.0000 mg | ORAL_TABLET | Freq: Every day | ORAL | 1 refills | Status: DC

## 2020-01-07 NOTE — Patient Instructions (Addendum)
Thank you for coming to the office today.  Increase [name] insulin up from 10 units to 12 units now, and then can increase up by 1 to 2 units every week if morning fasting sugar is >150 in the morning most days.  According to Urology - Keep taking Augmentin antibiotic 1 pill twice a day finish for up to max of 30 days.  Re ordered test strips and insulin pen needles  Recommend keep [facility] Diabetic Eye Exam.  Please schedule a Follow-up Appointment to: Return if symptoms worsen or fail to improve, for keep apt with me 01/2020.  If you have any other questions or concerns, please feel free to call the office or send a message through MyChart. You may also schedule an earlier appointment if necessary.  Additionally, you may be receiving a survey about your experience at our office within a few days to 1 week by e-mail or mail. We value your feedback.  Saralyn Pilar, DO [facility], [place]

## 2020-01-07 NOTE — Progress Notes (Signed)
Subjective:    Patient ID: [name], male    DOB: 1950-12-04, 69 y.o.   MRN: [id number]  [name] is a 69 y.o. male presenting on 01/07/2020 for Hospitalization Follow-up (Hyperkalemia )   HPI  HOSPITAL FOLLOW-UP VISIT  Hospital/Location: [facility] Date of Admission: 12/27/19 Date of Discharge: 12/31/19 Transitions of care telephone call: Completed by Barb Merino LPN  Reason for Admission / Diagnosis - Acute NSTEMI / Acute Kidney Injury CKDIII, Hyperkalemia  - Hospital H&P and Discharge Summary have been reviewed - Patient presents today 7 days after recent hospitalization - Acute NSTEMI, treated by PCI with cardiology placed stent to RCA, discharged on ASA, Brilinta, Coreg, Atorvastatin and Zetia., had ECHOcardiogram normal EF. Setup with cardiac rehab. - Acute kidney injury AoCKD III, had elevated Cr 2.7 and K up to 6.9, normalized K - Regarding heart and kidney issue, he was taken off Metformin XR  daily - He had prostate biopsy, had history of UTI w E Coli - he was switched from Bactrim to Augmentin.  - Today reports overall has done well after discharge - has cardiology apt Friday Awaiting nephrology needs new referral  - New medications on discharge: Brilinta - Changes to current meds on discharge: Amlodipine 2.5 mg discontinued, Bactrim discontinued and changed to Augmentin  I have reviewed the discharge medication list, and have reconciled the current and discharge medications today.   Current Outpatient Medications:  .  ACCU-CHEK SMARTVIEW test strip, USE 1 STRIP TO CHECK GLUCOSE TWICE DAILY, Disp: 100 each, Rfl: 12 .  acetaminophen (TYLENOL) 500 MG tablet, Take 1,000 mg by mouth every 8 (eight) hours as needed. , Disp: , Rfl:  .  allopurinol (ZYLOPRIM) 100 MG tablet, Take 1 tablet (100 mg total) by mouth at bedtime., Disp: 90 tablet, Rfl: 3 .  aspirin EC 81 MG EC tablet, Take 1 tablet (81 mg total) by mouth daily. Swallow whole., Disp: 30  tablet, Rfl: 0 .  atorvastatin (LIPITOR) 40 MG tablet, Take 1 tablet (40 mg total) by mouth daily., Disp: 90 tablet, Rfl: 1 .  BYDUREON BCISE 2 MG/0.85ML AUIJ, INJECT 2 MG SUBCUTANEOUSLY ONCE A WEEK (Patient taking differently: Inject 2 mg into the skin once a week. ), Disp: 12 mL, Rfl: 3 .  calcium carbonate (TUMS - DOSED IN MG ELEMENTAL CALCIUM) 500 MG chewable tablet, Chew 2 tablets by mouth at bedtime as needed for indigestion or heartburn. , Disp: , Rfl:  .  carvedilol (COREG) 6.25 MG tablet, Take 1 tablet (6.25 mg total) by mouth 2 (two) times daily with a meal., Disp: 180 tablet, Rfl: 1 .  docusate sodium (COLACE) 100 MG capsule, Take 100 mg by mouth 2 (two) times daily. , Disp: , Rfl:  .  ezetimibe (ZETIA) 10 MG tablet, Take 1 tablet (10 mg total) by mouth daily., Disp: 90 tablet, Rfl: 1 .  fluticasone (FLONASE) 50 MCG/ACT nasal spray, USE 2 SPRAYS IN EACH NOSTRIL ONCE DAILY USE FOR 4 TO 6 WEEKS, THEN STOP AND USE SEASONALLY OR AS NEEDED., Disp: 48 g, Rfl: 0 .  Insulin Glargine ([name] KWIKPEN) 100 UNIT/ML, Inject 10 Units into the skin at bedtime., Disp: , Rfl:  .  Insulin Pen Needle (NOVOFINE PLUS) 32G X 4 MM MISC, Use with [name] insulin daily injection as instructed, Disp: 90 each, Rfl: 3 .  lisinopril (ZESTRIL) 40 MG tablet, Take 1 tablet (40 mg total) by mouth daily., Disp: 90 tablet, Rfl: 3 .  nitroGLYCERIN (NITROSTAT) 0.4 MG  SL tablet, Place 1 tablet (0.4 mg total) under the tongue every 5 (five) minutes as needed for chest pain., Disp: 30 tablet, Rfl: 0 .  omeprazole (PRILOSEC) 20 MG capsule, Take 1 capsule (20 mg total) by mouth daily before breakfast., Disp: 90 capsule, Rfl: 3 .  ticagrelor (BRILINTA) 90 MG TABS tablet, Take 1 tablet (90 mg total) by mouth 2 (two) times daily., Disp: 180 tablet, Rfl: 1  ------------------------------------------------------------------------- Social History   Tobacco Use  . Smoking status: Former Smoker    Types: Cigars    Quit date: 2000      Years since quitting: 21.7  . Smokeless tobacco: Current User    Types: Chew  . Tobacco comment: wife states patient never smoked cigarettes, just chewing tobacco  Vaping Use  . Vaping Use: Never used  Substance Use Topics  . Alcohol use: Not Currently    Alcohol/week: 0.0 standard drinks  . Drug use: No    Review of Systems Per HPI unless specifically indicated above     Objective:    BP 138/70 (BP Location: Left Arm, Cuff Size: Normal)   Pulse 74   Temp 97.7 F (36.5 C) (Temporal)   Resp 16   Ht 5\' 11"  (1.803 m)   Wt 231 lb 12.8 oz (105.1 kg)   SpO2 100%   BMI 32.33 kg/m   Wt Readings from Last 3 Encounters:  01/07/20 231 lb 12.8 oz (105.1 kg)  12/31/19 230 lb (104.3 kg)  12/10/19 234 lb (106.1 kg)    Physical Exam Vitals and nursing note reviewed.  Constitutional:      General: He is not in acute distress.    Appearance: He is well-developed. He is not diaphoretic.     Comments: Well-appearing, comfortable, cooperative  HENT:     Head: Normocephalic and atraumatic.  Eyes:     General:        Right eye: No discharge.        Left eye: No discharge.     Conjunctiva/sclera: Conjunctivae normal.  Neck:     Thyroid: No thyromegaly.  Cardiovascular:     Rate and Rhythm: Normal rate and regular rhythm.     Heart sounds: Normal heart sounds. No murmur heard.   Pulmonary:     Effort: Pulmonary effort is normal. No respiratory distress.     Breath sounds: Normal breath sounds. No wheezing or rales.  Musculoskeletal:        General: Normal range of motion.     Cervical back: Normal range of motion and neck supple.  Lymphadenopathy:     Cervical: No cervical adenopathy.  Skin:    General: Skin is warm and dry.     Findings: No erythema or rash.  Neurological:     Mental Status: He is alert and oriented to person, place, and time.  Psychiatric:        Behavior: Behavior normal.     Comments: Well groomed, good eye contact, normal speech and thoughts        I have personally reviewed the radiology report from 12/29/19 ECHOCardiogram.  ECHOCARDIOGRAM REPORT      Patient Name:  [name] Date of Exam: 12/29/2019  Medical Rec #: 02/28/2020     Height:    71.0 in  Accession #:  [id number]     Weight:    234.0 lb  Date of Birth: 1950/09/13      BSA:     2.254 m  Patient Age:  33  years      BP:      132/72 mmHg  Patient Gender: M         HR:      73 bpm.  Exam Location: [facility]   Procedure: 2D Echo and Intracardiac Opacification Agent   Indications:   Chest Pain R07.9    History:     Patient has prior history of Echocardiogram examinations,  most          recent 12/06/2016.    Sonographer:   Wonda Cerise RDCS  Referring Phys: [id number] Raymon Mutton DUNN  Diagnosing Phys: Jodelle Red MD     Sonographer Comments: Technically difficult study due to poor echo windows  and Technically challenging study due to limited acoustic windows. Image  acquisition challenging due to patient body habitus.  IMPRESSIONS    1. Left ventricular ejection fraction, by estimation, is 60 to 65%. The  left ventricle has normal function. The left ventricle has no regional  wall motion abnormalities. Left ventricular diastolic parameters were  normal.  2. Right ventricular systolic function was not well visualized. The right  ventricular size is not well visualized.  3. The mitral valve is grossly normal. Trivial mitral valve  regurgitation. No evidence of mitral stenosis.  4. The aortic valve is grossly normal. Aortic valve regurgitation is not  visualized. No aortic stenosis is present.   Conclusion(s)/Recommendation(s): Technically challenging study, even with  use of definity. Grossly normal EF and wall motion, but reduced  sensitivity for focal abnormalities.   FINDINGS  Left Ventricle: Left ventricular ejection fraction, by estimation, is 60  to 65%. The  left ventricle has normal function. The left ventricle has no  regional wall motion abnormalities. Definity contrast agent was given IV  to delineate the left ventricular  endocardial borders. The left ventricular internal cavity size was normal  in size. There is borderline left ventricular hypertrophy. Left  ventricular diastolic parameters were normal.   Right Ventricle: The right ventricular size is not well visualized. Right  vetricular wall thickness was not well visualized. Right ventricular  systolic function was not well visualized.   Left Atrium: Left atrial size was normal in size.   Right Atrium: Right atrial size was not well visualized.   Pericardium: The pericardium was not well visualized.   Mitral Valve: The mitral valve is grossly normal. Trivial mitral valve  regurgitation. No evidence of mitral valve stenosis.   Tricuspid Valve: The tricuspid valve is not well visualized. Tricuspid  valve regurgitation is trivial.   Aortic Valve: The aortic valve is grossly normal. There is mild aortic  valve annular calcification. Aortic valve regurgitation is not visualized.  No aortic stenosis is present. Aortic valve mean gradient measures 6.0  mmHg. Aortic valve peak gradient  measures 10.8 mmHg. Aortic valve area, by VTI measures 2.05 cm.   Pulmonic Valve: The pulmonic valve was not well visualized. Pulmonic valve  regurgitation is not visualized.   Aorta: The aortic root and ascending aorta are structurally normal, with  no evidence of dilitation.   IAS/Shunts: The atrial septum is grossly normal.     LEFT VENTRICLE  PLAX 2D  LVIDd:     4.96 cm Diastology  LVIDs:     3.13 cm LV e' medial:  7.18 cm/s  LV PW:     1.17 cm LV E/e' medial: 11.8  LV IVS:    1.22 cm LV e' lateral:  10.20 cm/s  LVOT diam:   2.00 cm LV E/e' lateral:  8.3  LV SV:     71  LV SV Index:  31  LVOT Area:   3.14 cm     LEFT ATRIUM       Index    LA diam:    3.00 cm 1.33 cm/m  LA Vol (A2C):  33.0 ml 14.64 ml/m  LA Vol (A4C):  26.2 ml 11.62 ml/m  LA Biplane Vol: 29.6 ml 13.13 ml/m  AORTIC VALVE          PULMONIC VALVE  AV Area (Vmax):  2.07 cm   PV Vmax:    1.06 m/s  AV Area (Vmean):  1.97 cm   PV Peak grad: 4.5 mmHg  AV Area (VTI):   2.05 cm  AV Vmax:      164.00 cm/s  AV Vmean:     118.000 cm/s  AV VTI:      0.346 m  AV Peak Grad:   10.8 mmHg  AV Mean Grad:   6.0 mmHg  LVOT Vmax:     108.00 cm/s  LVOT Vmean:    74.100 cm/s  LVOT VTI:     0.226 m  LVOT/AV VTI ratio: 0.65    AORTA  Ao Root diam: 2.80 cm  Ao Asc diam: 2.90 cm   MITRAL VALVE  MV Area (PHT): 2.80 cm  SHUNTS  MV Decel Time: 271 msec  Systemic VTI: 0.23 m  MV E velocity: 84.80 cm/s Systemic Diam: 2.00 cm  MV A velocity: 94.70 cm/s  MV E/A ratio: 0.90   Jodelle Red MD  Electronically signed by Jodelle Red MD  Signature Date/Time: 12/30/2019/7:46:54 AM      Final   Results for orders placed or performed during the hospital encounter of 12/27/19  Respiratory Panel by RT PCR (Flu A&B, Covid) - Nasopharyngeal Swab   Specimen: Nasopharyngeal Swab  Result Value Ref Range   SARS Coronavirus 2 by RT PCR NEGATIVE NEGATIVE   Influenza A by PCR NEGATIVE NEGATIVE   Influenza B by PCR NEGATIVE NEGATIVE  Basic metabolic panel  Result Value Ref Range   Sodium 133 (L) 135 - 145 mmol/L   Potassium 6.9 (HH) 3.5 - 5.1 mmol/L   Chloride 109 98 - 111 mmol/L   CO2 15 (L) 22 - 32 mmol/L   Glucose, Bld 182 (H) 70 - 99 mg/dL   BUN 39 (H) 8 - 23 mg/dL   Creatinine, Ser 1.61 (H) 0.61 - 1.24 mg/dL   Calcium 09.6 8.9 - 04.5 mg/dL   GFR calc non Af Amer 23 (L) >60 mL/min   GFR calc Af Amer 27 (L) >60 mL/min   Anion gap 9 5 - 15  CBC  Result Value Ref Range   WBC 7.0 4.0 - 10.5 K/uL   RBC 4.75 4.22 - 5.81 MIL/uL   Hemoglobin 13.8 13.0 - 17.0 g/dL   HCT 40.9 39 -  52 %   MCV 90.5 80.0 - 100.0 fL   MCH 29.1 26.0 - 34.0 pg   MCHC 32.1 30.0 - 36.0 g/dL   RDW 81.1 91.4 - 78.2 %   Platelets 430 (H) 150 - 400 K/uL   nRBC 0.0 0.0 - 0.2 %  Hepatic function panel  Result Value Ref Range   Total Protein 8.5 (H) 6.5 - 8.1 g/dL   Albumin 4.3 3.5 - 5.0 g/dL   AST 25 15 - 41 U/L   ALT 19 0 - 44 U/L   Alkaline Phosphatase 58 38 - 126 U/L   Total  Bilirubin 0.6 0.3 - 1.2 mg/dL   Bilirubin, Direct <1.6 0.0 - 0.2 mg/dL   Indirect Bilirubin NOT CALCULATED 0.3 - 0.9 mg/dL  CK  Result Value Ref Range   Total CK 160 49.0 - 397.0 U/L  Hemoglobin A1c  Result Value Ref Range   Hgb A1c MFr Bld 8.7 (H) 4.8 - 5.6 %   Mean Plasma Glucose 202.99 mg/dL  Protime-INR  Result Value Ref Range   Prothrombin Time 13.2 11.4 - 15.2 seconds   INR 1.0 0.8 - 1.2  APTT  Result Value Ref Range   aPTT 96 (H) 24 - 36 seconds  Basic metabolic panel  Result Value Ref Range   Sodium 136 135 - 145 mmol/L   Potassium 5.1 3.5 - 5.1 mmol/L   Chloride 105 98 - 111 mmol/L   CO2 23 22 - 32 mmol/L   Glucose, Bld 131 (H) 70 - 99 mg/dL   BUN 36 (H) 8 - 23 mg/dL   Creatinine, Ser 1.09 (H) 0.61 - 1.24 mg/dL   Calcium 9.0 8.9 - 60.4 mg/dL   GFR calc non Af Amer 29 (L) >60 mL/min   GFR calc Af Amer 33 (L) >60 mL/min   Anion gap 8 5 - 15  CBC  Result Value Ref Range   WBC 5.3 4.0 - 10.5 K/uL   RBC 3.71 (L) 4.22 - 5.81 MIL/uL   Hemoglobin 11.0 (L) 13.0 - 17.0 g/dL   HCT 54.0 (L) 39 - 52 %   MCV 87.1 80.0 - 100.0 fL   MCH 29.6 26.0 - 34.0 pg   MCHC 34.1 30.0 - 36.0 g/dL   RDW 98.1 19.1 - 47.8 %   Platelets 288 150 - 400 K/uL   nRBC 0.0 0.0 - 0.2 %  Heparin level (unfractionated)  Result Value Ref Range   Heparin Unfractionated 0.75 (H) 0.30 - 0.70 IU/mL  Glucose, capillary  Result Value Ref Range   Glucose-Capillary 132 (H) 70 - 99 mg/dL  Heparin level (unfractionated)  Result Value Ref Range   Heparin Unfractionated 0.49 0.30 - 0.70 IU/mL  Glucose, capillary  Result Value Ref  Range   Glucose-Capillary 188 (H) 70 - 99 mg/dL  Glucose, capillary  Result Value Ref Range   Glucose-Capillary 151 (H) 70 - 99 mg/dL  Lipid panel  Result Value Ref Range   Cholesterol 147 0 - 200 mg/dL   Triglycerides 295 (H) <150 mg/dL   HDL 29 (L) >62 mg/dL   Total CHOL/HDL Ratio 5.1 RATIO   VLDL 37 0 - 40 mg/dL   LDL Cholesterol 81 0 - 99 mg/dL  Basic metabolic panel  Result Value Ref Range   Sodium 137 135 - 145 mmol/L   Potassium 4.1 3.5 - 5.1 mmol/L   Chloride 99 98 - 111 mmol/L   CO2 27 22 - 32 mmol/L   Glucose, Bld 167 (H) 70 - 99 mg/dL   BUN 32 (H) 8 - 23 mg/dL   Creatinine, Ser 1.30 (H) 0.61 - 1.24 mg/dL   Calcium 8.8 (L) 8.9 - 10.3 mg/dL   GFR calc non Af Amer 36 (L) >60 mL/min   GFR calc Af Amer 42 (L) >60 mL/min   Anion gap 11 5 - 15  Glucose, capillary  Result Value Ref Range   Glucose-Capillary 157 (H) 70 - 99 mg/dL  Heparin level (unfractionated)  Result Value Ref Range   Heparin Unfractionated 0.53 0.30 - 0.70 IU/mL  Heparin level (unfractionated)  Result Value Ref Range  Heparin Unfractionated 0.58 0.30 - 0.70 IU/mL  CBC  Result Value Ref Range   WBC 6.3 4.0 - 10.5 K/uL   RBC 3.59 (L) 4.22 - 5.81 MIL/uL   Hemoglobin 10.7 (L) 13.0 - 17.0 g/dL   HCT 16.131.8 (L) 39 - 52 %   MCV 88.6 80.0 - 100.0 fL   MCH 29.8 26.0 - 34.0 pg   MCHC 33.6 30.0 - 36.0 g/dL   RDW 09.614.1 04.511.5 - 40.915.5 %   Platelets 305 150 - 400 K/uL   nRBC 0.0 0.0 - 0.2 %  Glucose, capillary  Result Value Ref Range   Glucose-Capillary 218 (H) 70 - 99 mg/dL  Glucose, capillary  Result Value Ref Range   Glucose-Capillary 98 70 - 99 mg/dL   Comment 1 Notify RN    Comment 2 Document in Chart   Glucose, capillary  Result Value Ref Range   Glucose-Capillary 126 (H) 70 - 99 mg/dL   Comment 1 Notify RN    Comment 2 Document in Chart   Glucose, capillary  Result Value Ref Range   Glucose-Capillary 183 (H) 70 - 99 mg/dL   Comment 1 Notify RN    Comment 2 Document in Chart   Heparin level  (unfractionated)  Result Value Ref Range   Heparin Unfractionated 0.44 0.30 - 0.70 IU/mL  CBC  Result Value Ref Range   WBC 5.2 4.0 - 10.5 K/uL   RBC 3.42 (L) 4.22 - 5.81 MIL/uL   Hemoglobin 10.1 (L) 13.0 - 17.0 g/dL   HCT 81.130.8 (L) 39 - 52 %   MCV 90.1 80.0 - 100.0 fL   MCH 29.5 26.0 - 34.0 pg   MCHC 32.8 30.0 - 36.0 g/dL   RDW 91.413.9 78.211.5 - 95.615.5 %   Platelets 277 150 - 400 K/uL   nRBC 0.0 0.0 - 0.2 %  HIV Antibody (routine testing w rflx)  Result Value Ref Range   HIV Screen 4th Generation wRfx Non Reactive Non Reactive  Glucose, capillary  Result Value Ref Range   Glucose-Capillary 220 (H) 70 - 99 mg/dL  Basic metabolic panel  Result Value Ref Range   Sodium 141 135 - 145 mmol/L   Potassium 3.8 3.5 - 5.1 mmol/L   Chloride 98 98 - 111 mmol/L   CO2 33 (H) 22 - 32 mmol/L   Glucose, Bld 104 (H) 70 - 99 mg/dL   BUN 22 8 - 23 mg/dL   Creatinine, Ser 2.131.65 (H) 0.61 - 1.24 mg/dL   Calcium 8.4 (L) 8.9 - 10.3 mg/dL   GFR calc non Af Amer 42 (L) >60 mL/min   GFR calc Af Amer 49 (L) >60 mL/min   Anion gap 10 5 - 15  Glucose, capillary  Result Value Ref Range   Glucose-Capillary 115 (H) 70 - 99 mg/dL  Glucose, capillary  Result Value Ref Range   Glucose-Capillary 118 (H) 70 - 99 mg/dL  Glucose, capillary  Result Value Ref Range   Glucose-Capillary 146 (H) 70 - 99 mg/dL  Glucose, capillary  Result Value Ref Range   Glucose-Capillary 190 (H) 70 - 99 mg/dL  Basic metabolic panel  Result Value Ref Range   Sodium 138 135 - 145 mmol/L   Potassium 4.0 3.5 - 5.1 mmol/L   Chloride 104 98 - 111 mmol/L   CO2 27 22 - 32 mmol/L   Glucose, Bld 191 (H) 70 - 99 mg/dL   BUN 18 8 - 23 mg/dL   Creatinine, Ser 0.861.59 (H) 0.61 -  1.24 mg/dL   Calcium 8.7 (L) 8.9 - 10.3 mg/dL   GFR calc non Af Amer 44 (L) >60 mL/min   GFR calc Af Amer 51 (L) >60 mL/min   Anion gap 7 5 - 15  CBC  Result Value Ref Range   WBC 6.2 4.0 - 10.5 K/uL   RBC 3.59 (L) 4.22 - 5.81 MIL/uL   Hemoglobin 10.8 (L) 13.0 -  17.0 g/dL   HCT 45.8 (L) 39 - 52 %   MCV 87.7 80.0 - 100.0 fL   MCH 30.1 26.0 - 34.0 pg   MCHC 34.3 30.0 - 36.0 g/dL   RDW 09.9 83.3 - 82.5 %   Platelets 252 150 - 400 K/uL   nRBC 0.0 0.0 - 0.2 %  Glucose, capillary  Result Value Ref Range   Glucose-Capillary 199 (H) 70 - 99 mg/dL  Glucose, capillary  Result Value Ref Range   Glucose-Capillary 156 (H) 70 - 99 mg/dL  POCT Activated clotting time  Result Value Ref Range   Activated Clotting Time 279 seconds  ECHOCARDIOGRAM COMPLETE  Result Value Ref Range   Weight 3,744.01 oz   Height 71 in   BP 132/72 mmHg   Ao pk vel 1.64 m/s   AV Area VTI 2.05 cm2   AR max vel 2.07 cm2   AV Mean grad 6.0 mmHg   AV Peak grad 10.8 mmHg   S' Lateral 3.13 cm   AV Area mean vel 1.97 cm2   Area-P 1/2 2.80 cm2  Troponin I (High Sensitivity)  Result Value Ref Range   Troponin I (High Sensitivity) 4 <18 ng/L  Troponin I (High Sensitivity)  Result Value Ref Range   Troponin I (High Sensitivity) 33 (H) <18 ng/L  Troponin I (High Sensitivity)  Result Value Ref Range   Troponin I (High Sensitivity) 4,164 (HH) <18 ng/L  Troponin I (High Sensitivity)  Result Value Ref Range   Troponin I (High Sensitivity) 1,582 (HH) <18 ng/L  Troponin I (High Sensitivity)  Result Value Ref Range   Troponin I (High Sensitivity) 1,321 (HH) <18 ng/L      Assessment & Plan:   Problem List Items Addressed This Visit    Type 2 diabetes mellitus with other specified complication (HCC)   Relevant Medications   atorvastatin (LIPITOR) 40 MG tablet   Insulin Pen Needle (NOVOFINE PLUS) 32G X 4 MM MISC   ACCU-CHEK SMARTVIEW test strip   lisinopril (ZESTRIL) 40 MG tablet   Hyperlipidemia associated with type 2 diabetes mellitus (HCC)   Relevant Medications   atorvastatin (LIPITOR) 40 MG tablet   ezetimibe (ZETIA) 10 MG tablet   lisinopril (ZESTRIL) 40 MG tablet   Gout   Relevant Medications   allopurinol (ZYLOPRIM) 100 MG tablet   GERD without esophagitis    Relevant Medications   omeprazole (PRILOSEC) 20 MG capsule   Benign hypertension with CKD (chronic kidney disease) stage III (HCC)   Relevant Medications   atorvastatin (LIPITOR) 40 MG tablet   carvedilol (COREG) 6.25 MG tablet   ezetimibe (ZETIA) 10 MG tablet   lisinopril (ZESTRIL) 40 MG tablet   Other Relevant Orders   Ambulatory referral to Nephrology    Other Visit Diagnoses    History of non-ST elevation myocardial infarction (NSTEMI)    -  Primary   Relevant Medications   atorvastatin (LIPITOR) 40 MG tablet   carvedilol (COREG) 6.25 MG tablet   ezetimibe (ZETIA) 10 MG tablet   ticagrelor (BRILINTA) 90 MG TABS tablet  Uncontrolled type 2 diabetes mellitus with diabetic nephropathy, with long-term current use of insulin (HCC)       Relevant Medications   atorvastatin (LIPITOR) 40 MG tablet   ACCU-CHEK SMARTVIEW test strip   lisinopril (ZESTRIL) 40 MG tablet   Essential hypertension       Relevant Medications   atorvastatin (LIPITOR) 40 MG tablet   carvedilol (COREG) 6.25 MG tablet   ezetimibe (ZETIA) 10 MG tablet   lisinopril (ZESTRIL) 40 MG tablet       #S/p NSTEMI, CAD, HLD, HTN S/p PCI stent On DAPT ASA + Brilinta On different statin now Atorvastatin, Zetia Elevated BP improved on repeat check, continue on current regimen BB ACEI F/u with Cardiology   #Gout Refill allopurinol, chronic stable  #DM2 Elevated A1c   Recent Labs    01/11/19 0000 11/12/19 0829 12/27/19 1329  HGBA1C 7.8 8.9* 8.7*   Off metformin due to AKI, defer for now, will refer to Nephrology Adjust [name] insulin Continue Bydureon BCIse GLP1  #GERD Uncontrolled Restart PPI   Meds ordered this encounter  Medications  . atorvastatin (LIPITOR) 40 MG tablet    Sig: Take 1 tablet (40 mg total) by mouth daily.    Dispense:  90 tablet    Refill:  1  . carvedilol (COREG) 6.25 MG tablet    Sig: Take 1 tablet (6.25 mg total) by mouth 2 (two) times daily with a meal.    Dispense:  180  tablet    Refill:  1  . ezetimibe (ZETIA) 10 MG tablet    Sig: Take 1 tablet (10 mg total) by mouth daily.    Dispense:  90 tablet    Refill:  1  . ticagrelor (BRILINTA) 90 MG TABS tablet    Sig: Take 1 tablet (90 mg total) by mouth 2 (two) times daily.    Dispense:  180 tablet    Refill:  1  . Insulin Pen Needle (NOVOFINE PLUS) 32G X 4 MM MISC    Sig: Use with [name] insulin daily injection as instructed    Dispense:  90 each    Refill:  3  . omeprazole (PRILOSEC) 20 MG capsule    Sig: Take 1 capsule (20 mg total) by mouth daily before breakfast.    Dispense:  90 capsule    Refill:  3    Add extra refills on file, not ready for fill again yet  . allopurinol (ZYLOPRIM) 100 MG tablet    Sig: Take 1 tablet (100 mg total) by mouth at bedtime.    Dispense:  90 tablet    Refill:  3    Add extra refills keep on file, not ready for fill yet  . ACCU-CHEK SMARTVIEW test strip    Sig: USE 1 STRIP TO CHECK GLUCOSE TWICE DAILY    Dispense:  100 each    Refill:  12    Add refills  . lisinopril (ZESTRIL) 40 MG tablet    Sig: Take 1 tablet (40 mg total) by mouth daily.    Dispense:  90 tablet    Refill:  3    Add extra refills on file    Follow up plan: Return if symptoms worsen or fail to improve, for keep apt with me 01/2020.   Saralyn Pilar, DO [facility] 01/07/2020, 3:58 PM

## 2020-01-10 ENCOUNTER — Ambulatory Visit: Payer: Medicare HMO | Admitting: Cardiology

## 2020-01-10 ENCOUNTER — Encounter: Payer: Self-pay | Admitting: Cardiology

## 2020-01-10 ENCOUNTER — Other Ambulatory Visit: Payer: Self-pay

## 2020-01-10 VITALS — BP 134/72 | HR 78 | Ht 71.0 in | Wt 230.0 lb

## 2020-01-10 DIAGNOSIS — I10 Essential (primary) hypertension: Secondary | ICD-10-CM | POA: Diagnosis not present

## 2020-01-10 DIAGNOSIS — E7800 Pure hypercholesterolemia, unspecified: Secondary | ICD-10-CM

## 2020-01-10 DIAGNOSIS — Z720 Tobacco use: Secondary | ICD-10-CM

## 2020-01-10 DIAGNOSIS — I2510 Atherosclerotic heart disease of native coronary artery without angina pectoris: Secondary | ICD-10-CM | POA: Diagnosis not present

## 2020-01-10 MED ORDER — CARVEDILOL 12.5 MG PO TABS: 12.5000 mg | ORAL_TABLET | Freq: Two times a day (BID) | ORAL | 3 refills | Status: DC

## 2020-01-10 NOTE — Progress Notes (Signed)
Cardiology Office Note:    Date:  01/10/2020   ID:  [name], DOB 1950/06/09, MRN [id number]  PCP:  [name], DO  Cardiologist:  Debbe Odea, MD  Electrophysiologist:  None   Referring MD: Saralyn Pilar *   Chief Complaint  Patient presents with  . Other    Hospital follow up - L heart cath. Meds reviewed verbally with patient.     History of Present Illness:    [name] is a 69 y.o. male with a hx of CAD/NSTEMI (PCI/DES to RCA 12/2019), hypertension, hyperlipidemia, CKD-3, GERD, PE treated with Eliquis, chews tobacco who presents for follow-up.  Patient seen in the hospital earlier this month with symptoms of chest pain, diagnosed with NSTEMI, left heart cath revealed significant stenosis in the RCA.  He underwent PCI with drug-eluting stent to the proximal RCA.  Patient states doing okay, has occasional chest tightness not related with exertion.  He otherwise feels okay, takes all his medications as prescribed.  Has no medication side effects.  Prior notes Echocardiogram 12/2019, normal ejection fraction 60 to 65%. Left heart cath 12/30/2019 proximal RCA 90%, proximal LAD 40%, moderate left circumflex disease.  Status post successful angioplasty and drug-eluting stent placement to proximal RCA.  Past Medical History:  Diagnosis Date  . Allergy   . Chronic headaches   . GERD (gastroesophageal reflux disease)   . Heart burn   . Hyperlipidemia   . Hypertension     Past Surgical History:  Procedure Laterality Date  . KNEE SURGERY    . LEFT HEART CATH AND CORONARY ANGIOGRAPHY N/A 12/30/2019   Procedure: LEFT HEART CATH AND CORONARY ANGIOGRAPHY;  Surgeon: Iran Ouch, MD;  Location: [facility] INVASIVE CV LAB;  Service: Cardiovascular;  Laterality: N/A;  . SHOULDER SURGERY    . TONSILLECTOMY      Current Medications: Current Meds  Medication Sig  . ACCU-CHEK SMARTVIEW test strip USE 1 STRIP TO CHECK GLUCOSE TWICE DAILY  .  acetaminophen (TYLENOL) 500 MG tablet Take 1,000 mg by mouth every 8 (eight) hours as needed.   Marland Kitchen allopurinol (ZYLOPRIM) 100 MG tablet Take 1 tablet (100 mg total) by mouth at bedtime.  Marland Kitchen aspirin EC 81 MG EC tablet Take 1 tablet (81 mg total) by mouth daily. Swallow whole.  Marland Kitchen atorvastatin (LIPITOR) 40 MG tablet Take 1 tablet (40 mg total) by mouth daily.  Marland Kitchen BYDUREON BCISE 2 MG/0.85ML AUIJ INJECT 2 MG SUBCUTANEOUSLY ONCE A WEEK (Patient taking differently: Inject 2 mg into the skin once a week. )  . calcium carbonate (TUMS - DOSED IN MG ELEMENTAL CALCIUM) 500 MG chewable tablet Chew 2 tablets by mouth at bedtime as needed for indigestion or heartburn.   . docusate sodium (COLACE) 100 MG capsule Take 100 mg by mouth 2 (two) times daily.   Marland Kitchen ezetimibe (ZETIA) 10 MG tablet Take 1 tablet (10 mg total) by mouth daily.  . fluticasone (FLONASE) 50 MCG/ACT nasal spray USE 2 SPRAYS IN EACH NOSTRIL ONCE DAILY USE FOR 4 TO 6 WEEKS, THEN STOP AND USE SEASONALLY OR AS NEEDED.  Marland Kitchen Insulin Glargine ([name] KWIKPEN) 100 UNIT/ML Inject 10 Units into the skin at bedtime.  . Insulin Pen Needle (NOVOFINE PLUS) 32G X 4 MM MISC Use with [name] insulin daily injection as instructed  . lisinopril (ZESTRIL) 40 MG tablet Take 1 tablet (40 mg total) by mouth daily.  . nitroGLYCERIN (NITROSTAT) 0.4 MG SL tablet Place 1 tablet (0.4 mg total) under the  tongue every 5 (five) minutes as needed for chest pain.  Marland Kitchen. omeprazole (PRILOSEC) 20 MG capsule Take 1 capsule (20 mg total) by mouth daily before breakfast.  . ticagrelor (BRILINTA) 90 MG TABS tablet Take 1 tablet (90 mg total) by mouth 2 (two) times daily.  . [DISCONTINUED] carvedilol (COREG) 6.25 MG tablet Take 1 tablet (6.25 mg total) by mouth 2 (two) times daily with a meal.     Allergies:   Crestor [rosuvastatin calcium], Invokana [canagliflozin], Prednisone, and Sulfamethizole   Social History   Socioeconomic History  . Marital status: Married    Spouse name: Not on  file  . Number of children: Not on file  . Years of education: Not on file  . Highest education level: Not on file  Occupational History  . Occupation: retired  Tobacco Use  . Smoking status: Former Smoker    Types: Cigars    Quit date: 2000    Years since quitting: 21.8  . Smokeless tobacco: Current User    Types: Chew  . Tobacco comment: wife states patient never smoked cigarettes, just chewing tobacco  Vaping Use  . Vaping Use: Never used  Substance and Sexual Activity  . Alcohol use: Not Currently    Alcohol/week: 0.0 standard drinks  . Drug use: No  . Sexual activity: Yes    Birth control/protection: Inserts  Other Topics Concern  . Not on file  Social History Narrative  . Not on file   Social Determinants of Health   Financial Resource Strain:   . Difficulty of Paying Living Expenses: Not on file  Food Insecurity:   . Worried About Programme researcher, broadcasting/film/videounning Out of Food in the Last Year: Not on file  . Ran Out of Food in the Last Year: Not on file  Transportation Needs:   . Lack of Transportation (Medical): Not on file  . Lack of Transportation (Non-Medical): Not on file  Physical Activity:   . Days of Exercise per Week: Not on file  . Minutes of Exercise per Session: Not on file  Stress:   . Feeling of Stress : Not on file  Social Connections:   . Frequency of Communication with Friends and Family: Not on file  . Frequency of Social Gatherings with Friends and Family: Not on file  . Attends Religious Services: Not on file  . Active Member of Clubs or Organizations: Not on file  . Attends BankerClub or Organization Meetings: Not on file  . Marital Status: Not on file     Family History: The patient's family history includes Cancer in his father; Heart disease in his brother and sister. There is no history of Prostate cancer.  ROS:   Please see the history of present illness.     All other systems reviewed and are negative.  EKGs/Labs/Other Studies Reviewed:    The following  studies were reviewed today: TTE 12/29/2019 1. Left ventricular ejection fraction, by estimation, is 60 to 65%. The  left ventricle has normal function. The left ventricle has no regional  wall motion abnormalities. Left ventricular diastolic parameters were  normal.  2. Right ventricular systolic function was not well visualized. The right  ventricular size is not well visualized.  3. The mitral valve is grossly normal. Trivial mitral valve  regurgitation. No evidence of mitral stenosis.  4. The aortic valve is grossly normal. Aortic valve regurgitation is not  visualized. No aortic stenosis is present  LHC 12/30/2019 1.  Severe one-vessel coronary artery disease involving proximal right  coronary artery.  There is also moderate LAD and left circumflex disease. 2.  Left ventricular angiography was not performed due to chronic kidney disease.  EF was normal by echo.  Mildly elevated left ventricular end-diastolic pressure around 20 mmHg. 3.  Successful angioplasty and drug-eluting stent placement to the proximal right coronary artery.    EKG:  EKG is  ordered today.  The ekg ordered today demonstrates sinus rhythm, left axis deviation, possible old anteroseptal infarct.  Recent Labs: 12/27/2019: ALT 19 12/31/2019: BUN 18; Creatinine, Ser 1.59; Hemoglobin 10.8; Platelets 252; Potassium 4.0; Sodium 138  Recent Lipid Panel    Component Value Date/Time   CHOL 147 12/29/2019 0026   CHOL 212 (H) 11/12/2019 0829   TRIG 186 (H) 12/29/2019 0026   HDL 29 (L) 12/29/2019 0026   HDL 32 (L) 11/12/2019 0829   CHOLHDL 5.1 12/29/2019 0026   VLDL 37 12/29/2019 0026   LDLCALC 81 12/29/2019 0026   LDLCALC 139 (H) 11/12/2019 0829    Physical Exam:    VS:  BP 134/72 (BP Location: Left Arm, Patient Position: Sitting, Cuff Size: Normal)   Pulse 78   Ht 5\' 11"  (1.803 m)   Wt 230 lb (104.3 kg)   SpO2 98%   BMI 32.08 kg/m     Wt Readings from Last 3 Encounters:  01/10/20 230 lb (104.3 kg)   01/07/20 231 lb 12.8 oz (105.1 kg)  12/31/19 230 lb (104.3 kg)     GEN:  Well nourished, well developed in no acute distress HEENT: Normal NECK: No JVD; No carotid bruits LYMPHATICS: No lymphadenopathy CARDIAC: RRR, no murmurs, rubs, gallops RESPIRATORY:  Clear to auscultation without rales, wheezing or rhonchi  ABDOMEN: Soft, non-tender, non-distended MUSCULOSKELETAL:  No edema; No deformity  SKIN: Warm and dry NEUROLOGIC:  Alert and oriented x 3 PSYCHIATRIC:  Normal affect   ASSESSMENT:    1. Coronary artery disease involving native coronary artery of native heart, unspecified whether angina present   2. Essential hypertension   3. Pure hypercholesterolemia   4. Tobacco chew use    PLAN:    1.  History of CAD/NSTEMI status post PCI to RCA (12/30/2019).  Currently endorses occasional chest discomfort..  Continue aspirin, Brilinta for at least 12 months.  Continue Lipitor, increase Coreg to 12.5 mg twice daily for additional antianginal benefit.  Last echo showed normal EF, 60 to 65%.  Will refer patient to cardiac rehab.  2.  History of hypertension, BP controlled.  Continue Coreg, lisinopril.  3.  History of hyperlipidemia, continue Lipitor.  4.  Patient currently chews tobacco.  Complete cessation advised.  Follow-up in 3 months.  Total encounter time 40 minutes  Greater than 50% was spent in counseling and coordination of care with the patient   Medication Adjustments/Labs and Tests Ordered: Current medicines are reviewed at length with the patient today.  Concerns regarding medicines are outlined above.  Orders Placed This Encounter  Procedures  . EKG 12-Lead   Meds ordered this encounter  Medications  . carvedilol (COREG) 12.5 MG tablet    Sig: Take 1 tablet (12.5 mg total) by mouth 2 (two) times daily.    Dispense:  180 tablet    Refill:  3    Dose increase    Patient Instructions  Medication Instructions:  - Your physician has recommended you make the  following change in your medication:   1) INCREASE coreg (carvedilol) to 12.5 mg- take 1 tablet by mouth twice daily   -  you may use up your current supply of carvedilol 6.25 mg by taking 2 tablets (12.5 mg) by mouth twice daily until they are gone  *If you need a refill on your cardiac medications before your next appointment, please call your pharmacy*   Lab Work: - none ordered  If you have labs (blood work) drawn today and your tests are completely normal, you will receive your results only by: Marland Kitchen MyChart Message (if you have MyChart) OR . A paper copy in the mail If you have any lab test that is abnormal or we need to change your treatment, we will call you to review the results.   Testing/Procedures: - none ordered   Follow-Up: At [facility], you and your health needs are our priority.  As part of our continuing mission to provide you with exceptional heart care, we have created designated Provider Care Teams.  These Care Teams include your primary Cardiologist (physician) and Advanced Practice Providers (APPs -  Physician Assistants and Nurse Practitioners) who all work together to provide you with the care you need, when you need it.  We recommend signing up for the patient portal called "MyChart".  Sign up information is provided on this After Visit Summary.  MyChart is used to connect with patients for Virtual Visits (Telemedicine).  Patients are able to view lab/test results, encounter notes, upcoming appointments, etc.  Non-urgent messages can be sent to your provider as well.   To learn more about what you can do with MyChart, go to [email or web address].    Your next appointment:   2-3 month(s)  The format for your next appointment:   In Person  Provider:   Debbe Odea, MD (only)   Other Instructions - You have been referred to: Cardiac Rehab at [facility] - They will call you directly to schedule and orientation - If you do not hear anything from them  after 2 weeks, please call [phone] to follow up on this      Signed, Debbe Odea, MD  01/10/2020 12:36 PM    [facility]

## 2020-01-10 NOTE — Patient Instructions (Signed)
Medication Instructions:  - Your physician has recommended you make the following change in your medication:   1) INCREASE coreg (carvedilol) to 12.5 mg- take 1 tablet by mouth twice daily   - you may use up your current supply of carvedilol 6.25 mg by taking 2 tablets (12.5 mg) by mouth twice daily until they are gone  *If you need a refill on your cardiac medications before your next appointment, please call your pharmacy*   Lab Work: - none ordered  If you have labs (blood work) drawn today and your tests are completely normal, you will receive your results only by: Marland Kitchen MyChart Message (if you have MyChart) OR . A paper copy in the mail If you have any lab test that is abnormal or we need to change your treatment, we will call you to review the results.   Testing/Procedures: - none ordered   Follow-Up: At [facility], you and your health needs are our priority.  As part of our continuing mission to provide you with exceptional heart care, we have created designated Provider Care Teams.  These Care Teams include your primary Cardiologist (physician) and Advanced Practice Providers (APPs -  Physician Assistants and Nurse Practitioners) who all work together to provide you with the care you need, when you need it.  We recommend signing up for the patient portal called "MyChart".  Sign up information is provided on this After Visit Summary.  MyChart is used to connect with patients for Virtual Visits (Telemedicine).  Patients are able to view lab/test results, encounter notes, upcoming appointments, etc.  Non-urgent messages can be sent to your provider as well.   To learn more about what you can do with MyChart, go to [email or web address].    Your next appointment:   2-3 month(s)  The format for your next appointment:   In Person  Provider:   Debbe Odea, MD (only)   Other Instructions - You have been referred to: Cardiac Rehab at [facility] - They will call you directly  to schedule and orientation - If you do not hear anything from them after 2 weeks, please call [phone] to follow up on this

## 2020-01-13 ENCOUNTER — Ambulatory Visit (INDEPENDENT_AMBULATORY_CARE_PROVIDER_SITE_OTHER): Payer: Medicare HMO | Admitting: Pharmacist

## 2020-01-13 DIAGNOSIS — Z794 Long term (current) use of insulin: Secondary | ICD-10-CM | POA: Diagnosis not present

## 2020-01-13 DIAGNOSIS — I252 Old myocardial infarction: Secondary | ICD-10-CM

## 2020-01-13 DIAGNOSIS — E1169 Type 2 diabetes mellitus with other specified complication: Secondary | ICD-10-CM

## 2020-01-13 DIAGNOSIS — I129 Hypertensive chronic kidney disease with stage 1 through stage 4 chronic kidney disease, or unspecified chronic kidney disease: Secondary | ICD-10-CM | POA: Diagnosis not present

## 2020-01-13 DIAGNOSIS — N183 Chronic kidney disease, stage 3 unspecified: Secondary | ICD-10-CM

## 2020-01-13 NOTE — Chronic Care Management (AMB) (Signed)
Chronic Care Management   Note  01/13/2020 Name: [name] MRN: [id number] DOB: 05-Aug-1950   Subjective:   [name] is a 69 y.o. year old male who is a primary care patient of [name], DO. The CM team was consulted for assistance with chronic disease management and care coordination.   I reached out to [name] and his wife by phone today.   Note patient admitted to [facility] 10/1-10/5 related to hyperkalemia, NSTEMI and acute kidney injury.  Review of patient status, including review of consultants reports, laboratory and other test data, was performed as part of comprehensive evaluation and provision of chronic care management services.   SDOH (Social Determinants of Health) screening performed today: None. See Care Plan for related entries.   Objective:  Lab Results  Component Value Date   CREATININE 1.59 (H) 12/31/2019   CREATININE 1.65 (H) 12/30/2019   CREATININE 1.88 (H) 12/29/2019    Lab Results  Component Value Date   HGBA1C 8.7 (H) 12/27/2019       Component Value Date/Time   CHOL 147 12/29/2019 0026   CHOL 212 (H) 11/12/2019 0829   TRIG 186 (H) 12/29/2019 0026   HDL 29 (L) 12/29/2019 0026   HDL 32 (L) 11/12/2019 0829   CHOLHDL 5.1 12/29/2019 0026   VLDL 37 12/29/2019 0026   LDLCALC 81 12/29/2019 0026   LDLCALC 139 (H) 11/12/2019 0829    BP Readings from Last 3 Encounters:  01/10/20 134/72  01/07/20 138/70  12/31/19 (!) 133/94    Allergies  Allergen Reactions  . Crestor [Rosuvastatin Calcium] Other (See Comments)    myalgia  . Invokana [Canagliflozin] Other (See Comments)    dehydrated  . Prednisone Other (See Comments)  . Sulfamethizole     Medications Reviewed Today    Reviewed by Daphane Shepherd, RPH (Pharmacist) on 01/13/20 at 1542  Med List Status: <None>  Medication Order Taking? Sig Documenting Provider Last Dose Status Informant  ACCU-CHEK SMARTVIEW test strip  [id number]  USE 1 STRIP TO CHECK GLUCOSE TWICE DAILY Karamalegos, Netta Neat, DO  Active   acetaminophen (TYLENOL) 500 MG tablet [id number] Yes Take 1,000 mg by mouth every 8 (eight) hours as needed.  [provider] Taking Active Spouse/Significant Other  allopurinol (ZYLOPRIM) 100 MG tablet [id number] Yes Take 1 tablet (100 mg total) by mouth at bedtime. [name], DO Taking Active   aspirin EC 81 MG EC tablet [id number] Yes Take 1 tablet (81 mg total) by mouth daily. Swallow whole. Alford Highland, MD Taking Active   atorvastatin (LIPITOR) 40 MG tablet [id number] Yes Take 1 tablet (40 mg total) by mouth daily. [name], DO Taking Active   BYDUREON BCISE 2 MG/0.85ML Virl Cagey [id number] Yes INJECT 2 MG SUBCUTANEOUSLY ONCE A WEEK  Patient taking differently: Inject 2 mg into the skin once a week.    [name], DO Taking Active Spouse/Significant Other           Med Note [name] A   Mon Jan 13, 2020  3:32 PM) On Thursdays  calcium carbonate (TUMS - DOSED IN MG ELEMENTAL CALCIUM) 500 MG chewable tablet [id number] Yes Chew 2 tablets by mouth at bedtime as needed for indigestion or heartburn.  [provider] Taking Active Spouse/Significant Other  carvedilol (COREG) 12.5 MG tablet [id number] Yes Take 1 tablet (12.5 mg total) by mouth 2 (two) times daily. Debbe Odea, MD Taking Active   docusate sodium (COLACE) 100  MG capsule [id number] No Take 100 mg by mouth 2 (two) times daily.   Patient not taking: Reported on 01/13/2020   [provider] Not Taking Active Spouse/Significant Other  ezetimibe (ZETIA) 10 MG tablet [id number] Yes Take 1 tablet (10 mg total) by mouth daily. [name], DO Taking Active   fluticasone (FLONASE) 50 MCG/ACT nasal spray [id number] Yes USE 2 SPRAYS IN EACH NOSTRIL ONCE DAILY USE FOR 4 TO 6 WEEKS, THEN STOP AND USE SEASONALLY OR AS NEEDED. [name], DO Taking Active  Spouse/Significant Other  Insulin Glargine ([name] KWIKPEN) 100 UNIT/ML [id number] Yes Inject 10 Units into the skin at bedtime. Alford Highland, MD Taking Active   Insulin Pen Needle (NOVOFINE PLUS) 32G X 4 MM MISC [id number]  Use with [name] insulin daily injection as instructed [name], DO  Active   lisinopril (ZESTRIL) 40 MG tablet [id number] Yes Take 1 tablet (40 mg total) by mouth daily. [name], DO Taking Active   nitroGLYCERIN (NITROSTAT) 0.4 MG SL tablet [id number] No Place 1 tablet (0.4 mg total) under the tongue every 5 (five) minutes as needed for chest pain.  Patient not taking: Reported on 01/13/2020   Alford Highland, MD Not Taking Active   omeprazole (PRILOSEC) 20 MG capsule [id number] Yes Take 1 capsule (20 mg total) by mouth daily before breakfast. [name], DO Taking Active   ticagrelor (BRILINTA) 90 MG TABS tablet [id number] Yes Take 1 tablet (90 mg total) by mouth 2 (two) times daily. [name], DO Taking Active            Assessment:   Goals Addressed              This Visit's Progress   .  PharmD - Diabetes Management (pt-stated)        CARE PLAN ENTRY (see longitudinal plan of care for additional care plan information)  Current Barriers:  . Chronic Disease Management support, education, and care coordination needs related to type 2 diabetes mellitus, hypertension, hyperlipidemia, GERD, gout, seasonal allergies . Financial Barriers - patient has [employer] and reports copay for Bydureon & insulin glargine is cost prohibitive at this time  Pharmacist Clinical Goal(s):  Marland Kitchen Over the next 30 days, patient will work with CM Pharmacist and PCP to address needs related to medication assistance and diabetes management  Interventions: . Perform chart review o Note patient admitted to [facility] 10/1[facility]10/5 related to hyperkalemia, NSTEMI and acute kidney  injury o Patient seen by PCP on 10/12 for hospitalization follow up.  - Medications reviewed and reconcilled - Note proivder documents patinet remains off of metformin due to AKI, deferred for now and referred to Nephrology o Patient seen by Cardiologist on 10/15. Provider advised: - Patient to increase Coreg to 12.5 mg twice daily for additional antianginal benefit - Patient referred to Cardiac Rehab . Comprehensive medication review performed; medication list updated in electronic medical record . Discuss importance of blood sugar monitoring and control.  o Reports taking: - Bydureon 2 mg once weekly on Thursdays - [name] insulin 11 units daily - Confirms NOT taking metformin as held since hospital discharge o Reports has not been checking CBGs consistently. Reports latest readings:  Fasting Blood Glucose Bedtime  13 - October 130 221  14 - October    15 - October  145  16 - October 164   17 - October    18 - October     o Review  instructions for [name] from PCP from 10/12 office visit: "Increase [name] insulin up from 10 units to 12 units now, and then can increase up by 1 to 2 units every week if morning fasting sugar is >150 in the morning most days." Patient and wife verbalize understanding of this direction o Encourage patient to resume consistently checking blood sugar, keep log and bring record to medical appointments . Counsel on importance of blood pressure control and monitoring o Reports taking: - Carvedilol 6.25 mg - 2 tablets (12.5 mg total) twice daily - Lisinopril 40 mg daily o Denies recently checking home BP o Counsel on BP monitoring technique o Encourage patient to start checking home BP, keep log and bring record with him to medical appointments . Coordination of care: Reports expecting to receive a call from Cardiac Rehab within next two weeks to setup appointment  Patient Self Care Activities:  . Self administers medications as prescribed with  assistance from his wife o Uses weekly pillbox as adherence tool . Attends all scheduled provider appointments o Next appointment with Nephrology on 10/28 o Next appointment with PCP on 11/29 o Next appointment with Cardiology on 12/30 . Calls pharmacy for medication refills . Calls provider office for new concerns or questions . To check blood sugars twice daily and keep log   Please see past updates related to this goal by clicking on the "Past Updates" button in the selected goal        Plan:  Telephone follow up appointment with care management team member scheduled for: 11/17 at 4 pm  [name], PharmD, [facility] Clinical Pharmacist [facility] Medical [employer] [phone]

## 2020-01-13 NOTE — Patient Instructions (Signed)
Thank you allowing the Chronic Care Management Team to be a part of your care! It was a pleasure speaking with you today!     CCM (Chronic Care Management) Team    Alto Denver RN, MSN, CCM Nurse Care Coordinator  [phone]   [name] PharmD  Clinical Pharmacist  [phone]   Dickie La LCSW Clinical Social Worker [phone]  Visit Information  Goals Addressed              This Visit's Progress   .  PharmD - Diabetes Management (pt-stated)        CARE PLAN ENTRY (see longitudinal plan of care for additional care plan information)  Current Barriers:  . Chronic Disease Management support, education, and care coordination needs related to type 2 diabetes mellitus, hypertension, hyperlipidemia, GERD, gout, seasonal allergies . Financial Barriers - patient has [employer] and reports copay for Bydureon & insulin glargine is cost prohibitive at this time  Pharmacist Clinical Goal(s):  Marland Kitchen Over the next 30 days, patient will work with CM Pharmacist and PCP to address needs related to medication assistance and diabetes management  Interventions: . Perform chart review o Note patient admitted to [facility] 10/1-10/5 related to hyperkalemia, NSTEMI and acute kidney injury o Patient seen by PCP on 10/12 for hospitalization follow up.  - Medications reviewed and reconcilled - Note proivder documents patinet remains off of metformin due to AKI, deferred for now and referred to Nephrology o Patient seen by Cardiologist on 10/15. Provider advised: - Patient to increase Coreg to 12.5 mg twice daily for additional antianginal benefit - Patient referred to Cardiac Rehab . Comprehensive medication review performed; medication list updated in electronic medical record . Discuss importance of blood sugar monitoring and control.  o Reports taking: - Bydureon 2 mg once weekly on Thursdays - [name] insulin 11 units daily - Confirms NOT  taking metformin as held since hospital discharge o Reports has not been checking CBGs consistently. Reports latest readings:  Fasting Blood Glucose Bedtime  13 - October 130 221  14 - October    15 - October  145  16 - October 164   17 - October    18 - October     o Review instructions for [name] from PCP from 10/12 office visit: "Increase [name] insulin up from 10 units to 12 units now, and then can increase up by 1 to 2 units every week if morning fasting sugar is >150 in the morning most days." Patient and wife verbalize understanding of this direction o Encourage patient to resume consistently checking blood sugar, keep log and bring record to medical appointments . Counsel on importance of blood pressure control and monitoring o Reports taking: - Carvedilol 6.25 mg - 2 tablets (12.5 mg total) twice daily - Lisinopril 40 mg daily o Denies recently checking home BP o Counsel on BP monitoring technique o Encourage patient to start checking home BP, keep log and bring record with him to medical appointments . Coordination of care: Reports expecting to receive a call from Cardiac Rehab within next two weeks to setup appointment  Patient Self Care Activities:  . Self administers medications as prescribed with assistance from his wife o Uses weekly pillbox as adherence tool . Attends all scheduled provider appointments o Next appointment with Nephrology on 10/28 o Next appointment with PCP on 11/29 o Next appointment with Cardiology on 12/30 . Calls pharmacy for medication refills . Calls provider office for  new concerns or questions . To check blood sugars twice daily and keep log   Please see past updates related to this goal by clicking on the "Past Updates" button in the selected goal        Patient verbalizes understanding of instructions provided today.   Telephone follow up appointment with care management team member scheduled for: 11/17 at 4 pm  [name], PharmD, [facility] Clinical Pharmacist [facility] [employer] [phone]

## 2020-01-17 ENCOUNTER — Telehealth: Payer: Self-pay | Admitting: Family Medicine

## 2020-01-17 NOTE — Telephone Encounter (Signed)
Copied from CRM [id number]. Topic: Medicare AWV >> Jan 17, 2020 11:11 AM Claudette Laws R wrote: Reason for CRM: 10/22 Left message to remind that appointment on  Jan 21, 2020 will be completed by phone not in office -srs

## 2020-01-20 ENCOUNTER — Telehealth: Payer: Self-pay

## 2020-01-20 NOTE — Telephone Encounter (Signed)
He may take OTC regular Mucinex without the decongestant additive. So regular Mucinex, not "sinus".  He may try OTC nasal saline spray for congestion in addition to his previously prescribed Flonase nasal.  Saralyn Pilar, DO [facility] 01/20/2020, 5:30 PM

## 2020-01-20 NOTE — Telephone Encounter (Signed)
Copied from CRM [id number]. Topic: General - Other >> Jan 20, 2020  1:30 PM Laural Benes, [place] C wrote: Reason for CRM: pt called in to be advised. Pt says that he is having some congestion, pt would like to know what otc medication is he able to take considering the medications he are prescribed?  congeestion    Please assist.

## 2020-01-21 ENCOUNTER — Ambulatory Visit (INDEPENDENT_AMBULATORY_CARE_PROVIDER_SITE_OTHER): Payer: Medicare HMO

## 2020-01-21 VITALS — Ht 71.0 in | Wt 231.0 lb

## 2020-01-21 DIAGNOSIS — Z0000 Encounter for general adult medical examination without abnormal findings: Secondary | ICD-10-CM

## 2020-01-21 NOTE — Patient Instructions (Signed)
[name] , Thank you for taking time to come for your Medicare Wellness Visit. I appreciate your ongoing commitment to your health goals. Please review the following plan we discussed and let me know if I can assist you in the future.   Screening recommendations/referrals: Colonoscopy: cologuard 05/15/2018 Recommended yearly ophthalmology/optometry visit for glaucoma screening and checkup Recommended yearly dental visit for hygiene and checkup  Vaccinations: Influenza vaccine: decline Pneumococcal vaccine: decline Tdap vaccine: completed 07/25/2016 Shingles vaccine: discussed   Covid-19:  06/05/2019, 05/15/2019  Advanced directives: Advance directive discussed with you today.    Conditions/risks identified: chews tobacco  Next appointment: Follow up in one year for your annual wellness visit.   Preventive Care 15 Years and Older, Male Preventive care refers to lifestyle choices and visits with your health care provider that can promote health and wellness. What does preventive care include?  A yearly physical exam. This is also called an annual well check.  Dental exams once or twice a year.  Routine eye exams. Ask your health care provider how often you should have your eyes checked.  Personal lifestyle choices, including:  Daily care of your teeth and gums.  Regular physical activity.  Eating a healthy diet.  Avoiding tobacco and drug use.  Limiting alcohol use.  Practicing safe sex.  Taking low doses of aspirin every day.  Taking vitamin and mineral supplements as recommended by your health care provider. What happens during an annual well check? The services and screenings done by your health care provider during your annual well check will depend on your age, overall health, lifestyle risk factors, and family history of disease. Counseling  Your health care provider may ask you questions about your:  Alcohol use.  Tobacco use.  Drug use.  Emotional  well-being.  Home and relationship well-being.  Sexual activity.  Eating habits.  History of falls.  Memory and ability to understand (cognition).  Work and work Astronomer. Screening  You may have the following tests or measurements:  Height, weight, and BMI.  Blood pressure.  Lipid and cholesterol levels. These may be checked every 5 years, or more frequently if you are over 72 years old.  Skin check.  Lung cancer screening. You may have this screening every year starting at age 76 if you have a 30-pack-year history of smoking and currently smoke or have quit within the past 15 years.  Fecal occult blood test (FOBT) of the stool. You may have this test every year starting at age 67.  Flexible sigmoidoscopy or colonoscopy. You may have a sigmoidoscopy every 5 years or a colonoscopy every 10 years starting at age 34.  Prostate cancer screening. Recommendations will vary depending on your family history and other risks.  Hepatitis C blood test.  Hepatitis B blood test.  Sexually transmitted disease (STD) testing.  Diabetes screening. This is done by checking your blood sugar (glucose) after you have not eaten for a while (fasting). You may have this done every 1-3 years.  Abdominal aortic aneurysm (AAA) screening. You may need this if you are a current or former smoker.  Osteoporosis. You may be screened starting at age 56 if you are at high risk. Talk with your health care provider about your test results, treatment options, and if necessary, the need for more tests. Vaccines  Your health care provider may recommend certain vaccines, such as:  Influenza vaccine. This is recommended every year.  Tetanus, diphtheria, and acellular pertussis (Tdap, Td) vaccine. You may need  a Td booster every 10 years.  Zoster vaccine. You may need this after age 53.  Pneumococcal 13-valent conjugate (PCV13) vaccine. One dose is recommended after age 68.  Pneumococcal  polysaccharide (PPSV23) vaccine. One dose is recommended after age 20. Talk to your health care provider about which screenings and vaccines you need and how often you need them. This information is not intended to replace advice given to you by your health care provider. Make sure you discuss any questions you have with your health care provider. Document Released: 04/10/2015 Document Revised: 12/02/2015 Document Reviewed: 01/13/2015 Elsevier Interactive Patient Education  2017 [employer] Prevention in the Home Falls can cause injuries. They can happen to people of all ages. There are many things you can do to make your home safe and to help prevent falls. What can I do on the outside of my home?  Regularly fix the edges of walkways and driveways and fix any cracks.  Remove anything that might make you trip as you walk through a door, such as a raised step or threshold.  Trim any bushes or trees on the path to your home.  Use bright outdoor lighting.  Clear any walking paths of anything that might make someone trip, such as rocks or tools.  Regularly check to see if handrails are loose or broken. Make sure that both sides of any steps have handrails.  Any raised decks and porches should have guardrails on the edges.  Have any leaves, snow, or ice cleared regularly.  Use sand or salt on walking paths during winter.  Clean up any spills in your garage right away. This includes oil or grease spills. What can I do in the bathroom?  Use night lights.  Install grab bars by the toilet and in the tub and shower. Do not use towel bars as grab bars.  Use non-skid mats or decals in the tub or shower.  If you need to sit down in the shower, use a plastic, non-slip stool.  Keep the floor dry. Clean up any water that spills on the floor as soon as it happens.  Remove soap buildup in the tub or shower regularly.  Attach bath mats securely with double-sided non-slip rug  tape.  Do not have throw rugs and other things on the floor that can make you trip. What can I do in the bedroom?  Use night lights.  Make sure that you have a light by your bed that is easy to reach.  Do not use any sheets or blankets that are too big for your bed. They should not hang down onto the floor.  Have a firm chair that has side arms. You can use this for support while you get dressed.  Do not have throw rugs and other things on the floor that can make you trip. What can I do in the kitchen?  Clean up any spills right away.  Avoid walking on wet floors.  Keep items that you use a lot in easy-to-reach places.  If you need to reach something above you, use a strong step stool that has a grab bar.  Keep electrical cords out of the way.  Do not use floor polish or wax that makes floors slippery. If you must use wax, use non-skid floor wax.  Do not have throw rugs and other things on the floor that can make you trip. What can I do with my stairs?  Do not leave any items on the  stairs.  Make sure that there are handrails on both sides of the stairs and use them. Fix handrails that are broken or loose. Make sure that handrails are as long as the stairways.  Check any carpeting to make sure that it is firmly attached to the stairs. Fix any carpet that is loose or worn.  Avoid having throw rugs at the top or bottom of the stairs. If you do have throw rugs, attach them to the floor with carpet tape.  Make sure that you have a light switch at the top of the stairs and the bottom of the stairs. If you do not have them, ask someone to add them for you. What else can I do to help prevent falls?  Wear shoes that:  Do not have high heels.  Have rubber bottoms.  Are comfortable and fit you well.  Are closed at the toe. Do not wear sandals.  If you use a stepladder:  Make sure that it is fully opened. Do not climb a closed stepladder.  Make sure that both sides of the  stepladder are locked into place.  Ask someone to hold it for you, if possible.  Clearly mark and make sure that you can see:  Any grab bars or handrails.  First and last steps.  Where the edge of each step is.  Use tools that help you move around (mobility aids) if they are needed. These include:  Canes.  Walkers.  Scooters.  Crutches.  Turn on the lights when you go into a dark area. Replace any light bulbs as soon as they burn out.  Set up your furniture so you have a clear path. Avoid moving your furniture around.  If any of your floors are uneven, fix them.  If there are any pets around you, be aware of where they are.  Review your medicines with your doctor. Some medicines can make you feel dizzy. This can increase your chance of falling. Ask your doctor what other things that you can do to help prevent falls. This information is not intended to replace advice given to you by your health care provider. Make sure you discuss any questions you have with your health care provider. Document Released: 01/08/2009 Document Revised: 08/20/2015 Document Reviewed: 04/18/2014 Elsevier Interactive Patient Education  2017 [employer].

## 2020-01-21 NOTE — Progress Notes (Signed)
I connected with [name] today by telephone and verified that I am speaking with the correct person using two identifiers. Location patient: home Location provider: work Persons participating in the virtual visit: [name], spouse [name], [name] LPN.   I discussed the limitations, risks, security and privacy concerns of performing an evaluation and management service by telephone and the availability of in person appointments. I also discussed with the patient that there may be a patient responsible charge related to this service. The patient expressed understanding and verbally consented to this telephonic visit.    Interactive audio and video telecommunications were attempted between this provider and patient, however failed, due to patient having technical difficulties OR patient did not have access to video capability.  We continued and completed visit with audio only.     Vital signs may be patient reported or missing.  Subjective:   [name] is a 69 y.o. male who presents for Medicare Annual/Subsequent preventive examination.  Review of Systems     Cardiac Risk Factors include: advanced age (>25men, >48 women);diabetes mellitus;hypertension;male gender;obesity (BMI >30kg/m2);sedentary lifestyle;smoking/ tobacco exposure     Objective:    Today's Vitals   01/21/20 0816 01/21/20 0817  Weight: 231 lb (104.8 kg)   Height:  (1.803 m)   PainSc:  2    Body mass index is 32.22 kg/m.  Advanced Directives 01/21/2020 12/28/2019 12/27/2019 02/05/2019 11/06/2018 08/29/2017 12/06/2016  Does Patient Have a Medical Advance Directive? No No No No No No No  Would patient like information on creating a medical advance directive? - No - Patient declined No - Patient declined - - No - Patient declined No - Patient declined    Current Medications (verified) Outpatient Encounter Medications as of 01/21/2020  Medication Sig  . ACCU-CHEK SMARTVIEW test strip USE 1  STRIP TO CHECK GLUCOSE TWICE DAILY  . acetaminophen (TYLENOL) 500 MG tablet Take 1,000 mg by mouth every 8 (eight) hours as needed.   Marland Kitchen allopurinol (ZYLOPRIM) 100 MG tablet Take 1 tablet (100 mg total) by mouth at bedtime.  Marland Kitchen aspirin EC 81 MG EC tablet Take 1 tablet (81 mg total) by mouth daily. Swallow whole.  Marland Kitchen atorvastatin (LIPITOR) 40 MG tablet Take 1 tablet (40 mg total) by mouth daily.  Marland Kitchen BYDUREON BCISE 2 MG/0.85ML AUIJ INJECT 2 MG SUBCUTANEOUSLY ONCE A WEEK (Patient taking differently: Inject 2 mg into the skin once a week. )  . calcium carbonate (TUMS - DOSED IN MG ELEMENTAL CALCIUM) 500 MG chewable tablet Chew 2 tablets by mouth at bedtime as needed for indigestion or heartburn.   . carvedilol (COREG) 12.5 MG tablet Take 1 tablet (12.5 mg total) by mouth 2 (two) times daily.  Marland Kitchen ezetimibe (ZETIA) 10 MG tablet Take 1 tablet (10 mg total) by mouth daily.  . fluticasone (FLONASE) 50 MCG/ACT nasal spray USE 2 SPRAYS IN EACH NOSTRIL ONCE DAILY USE FOR 4 TO 6 WEEKS, THEN STOP AND USE SEASONALLY OR AS NEEDED.  Marland Kitchen Insulin Glargine ([name] KWIKPEN) 100 UNIT/ML Inject 10 Units into the skin at bedtime. (Patient taking differently: Inject 11 Units into the skin at bedtime. )  . Insulin Pen Needle (NOVOFINE PLUS) 32G X 4 MM MISC Use with [name] insulin daily injection as instructed  . lisinopril (ZESTRIL) 40 MG tablet Take 1 tablet (40 mg total) by mouth daily.  . nitroGLYCERIN (NITROSTAT) 0.4 MG SL tablet Place 1 tablet (0.4 mg total) under the tongue every 5 (five) minutes as needed  for chest pain.  Marland Kitchen omeprazole (PRILOSEC) 20 MG capsule Take 1 capsule (20 mg total) by mouth daily before breakfast.  . ticagrelor (BRILINTA) 90 MG TABS tablet Take 1 tablet (90 mg total) by mouth 2 (two) times daily.  Marland Kitchen docusate sodium (COLACE) 100 MG capsule Take 100 mg by mouth 2 (two) times daily.  (Patient not taking: Reported on 01/13/2020)   No facility-administered encounter medications on file as of  01/21/2020.    Allergies (verified) Crestor [rosuvastatin calcium], Invokana [canagliflozin], Prednisone, and Sulfamethizole   History: Past Medical History:  Diagnosis Date  . Allergy   . Chronic headaches   . GERD (gastroesophageal reflux disease)   . Heart burn   . Hyperlipidemia   . Hypertension    Past Surgical History:  Procedure Laterality Date  . BACK SURGERY  02/26/2019  . KNEE SURGERY    . LEFT HEART CATH AND CORONARY ANGIOGRAPHY N/A 12/30/2019   Procedure: LEFT HEART CATH AND CORONARY ANGIOGRAPHY;  Surgeon: Iran Ouch, MD;  Location: [facility] INVASIVE CV LAB;  Service: Cardiovascular;  Laterality: N/A;  . SHOULDER SURGERY    . TONSILLECTOMY     Family History  Problem Relation Age of Onset  . Cancer Father        lung  . Heart disease Sister   . Heart disease Brother   . Prostate cancer Neg Hx    Social History   Socioeconomic History  . Marital status: Married    Spouse name: Not on file  . Number of children: Not on file  . Years of education: Not on file  . Highest education level: Not on file  Occupational History  . Occupation: retired  Tobacco Use  . Smoking status: Former Smoker    Types: Cigars    Quit date: 2000    Years since quitting: 21.8  . Smokeless tobacco: Current User    Types: Chew  . Tobacco comment: wife states patient never smoked cigarettes, just chewing tobacco  Vaping Use  . Vaping Use: Never used  Substance and Sexual Activity  . Alcohol use: Not Currently    Alcohol/week: 0.0 standard drinks  . Drug use: No  . Sexual activity: Yes    Birth control/protection: Inserts  Other Topics Concern  . Not on file  Social History Narrative  . Not on file   Social Determinants of Health   Financial Resource Strain: Low Risk   . Difficulty of Paying Living Expenses: Not hard at all  Food Insecurity: No Food Insecurity  . Worried About Programme researcher, broadcasting/film/video in the Last Year: Never true  . Ran Out of Food in the Last Year:  Never true  Transportation Needs: No Transportation Needs  . Lack of Transportation (Medical): No  . Lack of Transportation (Non-Medical): No  Physical Activity: Inactive  . Days of Exercise per Week: 0 days  . Minutes of Exercise per Session: 0 min  Stress: No Stress Concern Present  . Feeling of Stress : Not at all  Social Connections:   . Frequency of Communication with Friends and Family: Not on file  . Frequency of Social Gatherings with Friends and Family: Not on file  . Attends Religious Services: Not on file  . Active Member of Clubs or Organizations: Not on file  . Attends Banker Meetings: Not on file  . Marital Status: Not on file    Tobacco Counseling Ready to quit: Not Answered Counseling given: Not Answered Comment: wife states patient  never smoked cigarettes, just chewing tobacco   Clinical Intake:  Pre-visit preparation completed: Yes  Pain : 0-10 Pain Score: 2  Pain Type: Acute pain Pain Location: Head Pain Descriptors / Indicators: Aching Pain Onset: 1 to 4 weeks ago Pain Frequency: Constant     Nutritional Status: BMI > 30  Obese Nutritional Risks: None Diabetes: Yes  How often do you need to have someone help you when you read instructions, pamphlets, or other written materials from your doctor or pharmacy?: 1 - Never What is the last grade level you completed in school?: 8th grade  Diabetic? Yes Nutrition Risk Assessment:  Has the patient had any N/V/D within the last 2 months?  No  Does the patient have any non-healing wounds?  No  Has the patient had any unintentional weight loss or weight gain?  No   Diabetes:  Is the patient diabetic?  Yes  If diabetic, was a CBG obtained today?  No  Did the patient bring in their glucometer from home?  No  How often do you monitor your CBG's? daily.   Financial Strains and Diabetes Management:  Are you having any financial strains with the device, your supplies or your medication? No  .  Does the patient want to be seen by Chronic Care Management for management of their diabetes?  No  Would the patient like to be referred to a Nutritionist or for Diabetic Management?  No   Diabetic Exams:  Diabetic Eye Exam: Overdue for diabetic eye exam. Pt has been advised about the importance in completing this exam. Patient advised to call and schedule an eye exam. Diabetic Foot Exam: Completed 11/18/2019   Interpreter Needed?: No  Information entered by :: NAllen LPN   Activities of Daily Living In your present state of health, do you have any difficulty performing the following activities: 01/21/2020 12/28/2019  Hearing? Y N  Comment has hearing aides -  Vision? N N  Difficulty concentrating or making decisions? N N  Walking or climbing stairs? Y N  Comment due to back -  Dressing or bathing? N N  Doing errands, shopping? N N  Preparing Food and eating ? N -  Using the Toilet? N -  In the past six months, have you accidently leaked urine? Y -  Do you have problems with loss of bowel control? N -  Managing your Medications? Y -  Comment wife helps manage -  Managing your Finances? Y -  Comment wife manages -  Housekeeping or managing your Housekeeping? Y -  Comment wife manages -  Some recent data might be hidden    Patient Care Team: [name], DO as PCP - General (Family Medicine) Debbe Odea, MD as PCP - Cardiology (Cardiology) [place], Jackelyn Poling, [facility] as Pharmacist  Indicate any recent Medical Services you may have received from other than [facility] providers in the past year (date may be approximate).     Assessment:   This is a routine wellness examination for Stylianos.  Hearing/Vision screen  Hearing Screening   125Hz  250Hz  500Hz  1000Hz  2000Hz  3000Hz  4000Hz  6000Hz  8000Hz   Right ear:           Left ear:           Vision Screening Comments: Regular eye exams, [facility]   Dietary issues and exercise activities  discussed: Current Exercise Habits: The patient does not participate in regular exercise at present  Goals    .  Patient Stated  01/21/2020, wants to get BP under control    .  PharmD - Diabetes Management (pt-stated)      CARE PLAN ENTRY (see longitudinal plan of care for additional care plan information)  Current Barriers:  . Chronic Disease Management support, education, and care coordination needs related to type 2 diabetes mellitus, hypertension, hyperlipidemia, GERD, gout, seasonal allergies . Financial Barriers - patient has [employer] Medicare insurance and reports copay for Bydureon & insulin glargine is cost prohibitive at this time  Pharmacist Clinical Goal(s):  Marland Kitchen. Over the next 30 days, patient will work with CM Pharmacist and PCP to address needs related to medication assistance and diabetes management  Interventions: . Perform chart review o Note patient [facility] 10[facility]-10[facility]5 related to hyperkalemia, NSTEMI and acute kidney injury o Patient seen by PCP on 10/12 for hospitalization follow up.  - Medications reviewed and reconcilled - Note proivder documents patinet remains off of metformin due to AKI, deferred for now and referred to Nephrology o Patient seen by Cardiologist on 10/15. Provider advised: - Patient to increase Coreg to 12.5 mg twice daily for additional antianginal benefit - Patient referred to Cardiac Rehab . Comprehensive medication review performed; medication list updated in electronic medical record . Discuss importance of blood sugar monitoring and control.  o Reports taking: - Bydureon 2 mg once weekly on Thursdays - [name] insulin 11 units daily - Confirms NOT taking metformin as held since hospital discharge o Reports has not been checking CBGs consistently. Reports latest readings:  Fasting Blood Glucose Bedtime  13 - October 130 221  14 - October    15 - October  145  16 - October 164   17 - October    18  - October     o Review instructions for [name] from PCP from 10/12 office visit: "Increase [name] insulin up from 10 units to 12 units now, and then can increase up by 1 to 2 units every week if morning fasting sugar is >150 in the morning most days." Patient and wife verbalize understanding of this direction o Encourage patient to resume consistently checking blood sugar, keep log and bring record to medical appointments . Counsel on importance of blood pressure control and monitoring o Reports taking: - Carvedilol 6.25 mg - 2 tablets (12.5 mg total) twice daily - Lisinopril 40 mg daily o Denies recently checking home BP o Counsel on BP monitoring technique o Encourage patient to start checking home BP, keep log and bring record with him to medical appointments . Coordination of care: Reports expecting to receive a call from Cardiac Rehab within next two weeks to setup appointment  Patient Self Care Activities:  . Self administers medications as prescribed with assistance from his wife o Uses weekly pillbox as adherence tool . Attends all scheduled provider appointments o Next appointment with Nephrology on 10/28 o Next appointment with PCP on 11/29 o Next appointment with Cardiology on 12/30 . Calls pharmacy for medication refills . Calls provider office for new concerns or questions . To check blood sugars twice daily and keep log   Please see past updates related to this goal by clicking on the "Past Updates" button in the selected goal     .  Quit Smoking      Tobacco cessation discussed      Depression Screen PHQ 2/9 Scores 01/21/2020 11/18/2019 08/28/2018 04/23/2018 01/15/2018 08/29/2017 03/13/2017  PHQ - 2 Score 0 0 0 0 0 0 0  Fall Risk Fall Risk  01/21/2020 11/18/2019 08/28/2018 04/23/2018 01/15/2018  Falls in the past year? 0 0 0 0 No  Number falls in past yr: - 0 - - -  Injury with Fall? - 0 - - -  Risk for fall due to : Medication side effect - - - -  Follow up Falls  evaluation completed;Education provided;Falls prevention discussed Falls evaluation completed Falls evaluation completed Falls evaluation completed -    Any stairs in or around the home? Yes  If so, are there any without handrails? No  Home free of loose throw rugs in walkways, pet beds, electrical cords, etc? Yes  Adequate lighting in your home to reduce risk of falls? Yes   ASSISTIVE DEVICES UTILIZED TO PREVENT FALLS:  Life alert? No  Use of a cane, walker or w/c? No  Grab bars in the bathroom? Yes  Shower chair or bench in shower? Yes  Elevated toilet seat or a handicapped toilet? Yes   TIMED UP AND GO:  Was the test performed? No .   Cognitive Function:     6CIT Screen 01/21/2020 08/29/2017  What Year? 0 points 0 points  What month? 0 points 0 points  What time? 0 points 0 points  Count back from 20 0 points 0 points  Months in reverse 4 points 0 points  Repeat phrase 4 points 2 points  Total Score 8 2    Immunizations Immunization History  Administered Date(s) Administered  . PFIZER SARS-COV-2 Vaccination 05/15/2019, 06/05/2019  . Tdap 07/25/2016    TDAP status: Up to date Flu Vaccine status: Declined, Education has been provided regarding the importance of this vaccine but patient still declined. Advised may receive this vaccine at local pharmacy or Health Dept. Aware to provide a copy of the vaccination record if obtained from local pharmacy or Health Dept. Verbalized acceptance and understanding. Pneumococcal vaccine status: Declined,  Education has been provided regarding the importance of this vaccine but patient still declined. Advised may receive this vaccine at local pharmacy or Health Dept. Aware to provide a copy of the vaccination record if obtained from local pharmacy or Health Dept. Verbalized acceptance and understanding.  Covid-19 vaccine status: Completed vaccines  Qualifies for Shingles Vaccine? Yes   Zostavax completed No   Shingrix Completed?: No.     Education has been provided regarding the importance of this vaccine. Patient has been advised to call insurance company to determine out of pocket expense if they have not yet received this vaccine. Advised may also receive vaccine at local pharmacy or Health Dept. Verbalized acceptance and understanding.  Screening Tests Health Maintenance  Topic Date Due  . OPHTHALMOLOGY EXAM  05/26/2019  . INFLUENZA VACCINE  06/25/2020 (Originally 10/27/2019)  . PNA vac Low Risk Adult (1 of 2 - PCV13) 11/17/2020 (Originally 08/26/2016)  . HEMOGLOBIN A1C  06/26/2020  . FOOT EXAM  11/17/2020  . Fecal DNA (Cologuard)  05/15/2021  . TETANUS/TDAP  07/26/2026  . COVID-19 Vaccine  Completed  . Hepatitis C Screening  Completed    Health Maintenance  Health Maintenance Due  Topic Date Due  . OPHTHALMOLOGY EXAM  05/26/2019    Colorectal cancer screening: Completed 05/15/2018. Repeat every 3 years  Lung Cancer Screening: (Low Dose CT Chest recommended if Age 54-80 years, 30 pack-year currently smoking OR have quit w/in 15years.) does not qualify.   Lung Cancer Screening Referral: no  Additional Screening:  Hepatitis C Screening: does qualify; Completed 08/29/2017  Vision Screening: Recommended annual ophthalmology  exams for early detection of glaucoma and other disorders of the eye. Is the patient up to date with their annual eye exam?  No  Who is the provider or what is the name of the office in which the patient attends annual eye exams? [facility] If pt is not established with a provider, would they like to be referred to a provider to establish care? No .   Dental Screening: Recommended annual dental exams for proper oral hygiene  Community Resource Referral / Chronic Care Management: CRR required this visit?  No   CCM required this visit?  No      Plan:     I have personally reviewed and noted the following in the patient's chart:   . Medical and social history . Use of alcohol,  tobacco or illicit drugs  . Current medications and supplements . Functional ability and status . Nutritional status . Physical activity . Advanced directives . List of other physicians . Hospitalizations, surgeries, and ER visits in previous 12 months . Vitals . Screenings to include cognitive, depression, and falls . Referrals and appointments  In addition, I have reviewed and discussed with patient certain preventive protocols, quality metrics, and best practice recommendations. A written personalized care plan for preventive services as well as general preventive health recommendations were provided to patient.     Barb Merino, LPN   [id number]   Nurse Notes:

## 2020-01-22 ENCOUNTER — Other Ambulatory Visit: Payer: Self-pay

## 2020-01-22 ENCOUNTER — Encounter: Payer: Medicare HMO | Attending: Cardiovascular Disease

## 2020-01-22 DIAGNOSIS — I10 Essential (primary) hypertension: Secondary | ICD-10-CM | POA: Insufficient documentation

## 2020-01-22 DIAGNOSIS — I214 Non-ST elevation (NSTEMI) myocardial infarction: Secondary | ICD-10-CM

## 2020-01-22 DIAGNOSIS — Z955 Presence of coronary angioplasty implant and graft: Secondary | ICD-10-CM | POA: Insufficient documentation

## 2020-01-22 DIAGNOSIS — Z87891 Personal history of nicotine dependence: Secondary | ICD-10-CM | POA: Insufficient documentation

## 2020-01-22 NOTE — Progress Notes (Signed)
Virtual Visit completed. Patient informed on EP and RD appointment and 6 Minute walk test. Patient also informed of patient health questionnaires on My Chart. Patient Verbalizes understanding. Visit diagnosis can be found in [facility] 12/27/2019. 

## 2020-01-23 VITALS — Ht 70.5 in | Wt 235.4 lb

## 2020-01-23 DIAGNOSIS — I214 Non-ST elevation (NSTEMI) myocardial infarction: Secondary | ICD-10-CM | POA: Diagnosis not present

## 2020-01-23 DIAGNOSIS — Z87891 Personal history of nicotine dependence: Secondary | ICD-10-CM | POA: Diagnosis not present

## 2020-01-23 DIAGNOSIS — N179 Acute kidney failure, unspecified: Secondary | ICD-10-CM | POA: Diagnosis not present

## 2020-01-23 DIAGNOSIS — N1832 Chronic kidney disease, stage 3b: Secondary | ICD-10-CM | POA: Diagnosis not present

## 2020-01-23 DIAGNOSIS — I10 Essential (primary) hypertension: Secondary | ICD-10-CM | POA: Diagnosis not present

## 2020-01-23 DIAGNOSIS — Z955 Presence of coronary angioplasty implant and graft: Secondary | ICD-10-CM | POA: Diagnosis not present

## 2020-01-23 NOTE — Progress Notes (Signed)
Cardiac Individual Treatment Plan  Patient Details  Name: [name] MRN: [id number] Date of Birth: [birth year] Referring Provider:     Cardiac Rehab from 01/23/2020 in [facility] Cardiac and Pulmonary Rehab  Referring Provider Kathlyn Sacramento, MD      Initial Encounter Date:    Cardiac Rehab from 01/23/2020 in [facility] Cardiac and Pulmonary Rehab  Date 01/23/20      Visit Diagnosis: NSTEMI (non-ST elevated myocardial infarction) [facility])  Patient's Home Medications on Admission:  Current Outpatient Medications:  .  ACCU-CHEK SMARTVIEW test strip, USE 1 STRIP TO CHECK GLUCOSE TWICE DAILY, Disp: 100 each, Rfl: 12 .  acetaminophen (TYLENOL) 500 MG tablet, Take 1,000 mg by mouth every 8 (eight) hours as needed. , Disp: , Rfl:  .  allopurinol (ZYLOPRIM) 100 MG tablet, Take 1 tablet (100 mg total) by mouth at bedtime., Disp: 90 tablet, Rfl: 3 .  aspirin EC 81 MG EC tablet, Take 1 tablet (81 mg total) by mouth daily. Swallow whole., Disp: 30 tablet, Rfl: 0 .  atorvastatin (LIPITOR) 40 MG tablet, Take 1 tablet (40 mg total) by mouth daily., Disp: 90 tablet, Rfl: 1 .  BYDUREON BCISE 2 MG/0.85ML AUIJ, INJECT 2 MG SUBCUTANEOUSLY ONCE A WEEK (Patient taking differently: Inject 2 mg into the skin once a week. ), Disp: 12 mL, Rfl: 3 .  calcium carbonate (TUMS - DOSED IN MG ELEMENTAL CALCIUM) 500 MG chewable tablet, Chew 2 tablets by mouth at bedtime as needed for indigestion or heartburn. , Disp: , Rfl:  .  carvedilol (COREG) 12.5 MG tablet, Take 1 tablet (12.5 mg total) by mouth 2 (two) times daily., Disp: 180 tablet, Rfl: 3 .  docusate sodium (COLACE) 100 MG capsule, Take 100 mg by mouth 2 (two) times daily.  (Patient not taking: Reported on 01/13/2020), Disp: , Rfl:  .  ezetimibe (ZETIA) 10 MG tablet, Take 1 tablet (10 mg total) by mouth daily., Disp: 90 tablet, Rfl: 1 .  fluticasone (FLONASE) 50 MCG/ACT nasal spray, USE 2 SPRAYS IN EACH NOSTRIL ONCE DAILY USE FOR 4 TO 6 WEEKS, THEN STOP AND USE  SEASONALLY OR AS NEEDED., Disp: 48 g, Rfl: 0 .  Insulin Glargine ([name] KWIKPEN) 100 UNIT/ML, Inject 10 Units into the skin at bedtime. (Patient taking differently: Inject 11 Units into the skin at bedtime. ), Disp: , Rfl:  .  Insulin Pen Needle (NOVOFINE PLUS) 32G X 4 MM MISC, Use with [name] insulin daily injection as instructed, Disp: 90 each, Rfl: 3 .  lisinopril (ZESTRIL) 40 MG tablet, Take 1 tablet (40 mg total) by mouth daily., Disp: 90 tablet, Rfl: 3 .  nitroGLYCERIN (NITROSTAT) 0.4 MG SL tablet, Place 1 tablet (0.4 mg total) under the tongue every 5 (five) minutes as needed for chest pain., Disp: 30 tablet, Rfl: 0 .  omeprazole (PRILOSEC) 20 MG capsule, Take 1 capsule (20 mg total) by mouth daily before breakfast., Disp: 90 capsule, Rfl: 3 .  ticagrelor (BRILINTA) 90 MG TABS tablet, Take 1 tablet (90 mg total) by mouth 2 (two) times daily., Disp: 180 tablet, Rfl: 1  Past Medical History: Past Medical History:  Diagnosis Date  . Allergy   . Chronic headaches   . GERD (gastroesophageal reflux disease)   . Heart burn   . Hyperlipidemia   . Hypertension     Tobacco Use: Social History   Tobacco Use  Smoking Status Former Smoker  . Types: Cigars  . Quit date: 2000  . Years since quitting: 21.8  Smokeless  Tobacco Current User  . Types: Chew  Tobacco Comment   wife states patient never smoked cigarettes, just chewing tobacco    Yvone Neu is a current tobacco user. Intervention for tobacco cessation was provided at the initial medical review. He was asked about readiness to quit and reported he has thought about it but is not ready to quit. Patient currently chews tobacco. Patient was advised and educated about tobacco cessation using combination therapy, tobacco cessation classes, quit line, and quit smoking apps. Patient demonstrated understanding of this material. Staff will continue to provide encouragement and follow up with the patient throughout the program.  Labs: Recent  Review Flowsheet Data    Labs for ITP Cardiac and Pulmonary Rehab Latest Ref Rng & Units 11/29/2018 01/11/2019 11/12/2019 12/27/2019 12/29/2019   Cholestrol 0 - 200 mg/dL 200(H) - 212(H) - 147   LDLCALC 0 - 99 mg/dL 134(H) - 139(H) - 81   HDL >40 mg/dL 36(L) - 32(L) - 29(L)   Trlycerides <150 mg/dL 168(H) - 228(H) - 186(H)   Hemoglobin A1c 4.8 - 5.6 % 7.4(H) 7.8 8.9(H) 8.7(H) -       Exercise Target Goals: Exercise Program Goal: Individual exercise prescription set using results from initial 6 min walk test and THRR while considering  patient's activity barriers and safety.   Exercise Prescription Goal: Initial exercise prescription builds to 30-45 minutes a day of aerobic activity, 2-3 days per week.  Home exercise guidelines will be given to patient during program as part of exercise prescription that the participant will acknowledge.   Education: Aerobic Exercise & Resistance Training: - Gives group verbal and written instruction on the various components of exercise. Focuses on aerobic and resistive training programs and the benefits of this training and how to safely progress through these programs..   Education: Exercise & Equipment Safety: - Individual verbal instruction and demonstration of equipment use and safety with use of the equipment.   Cardiac Rehab from 01/22/2020 in [facility] Cardiac and Pulmonary Rehab  Date 01/22/20  Educator [facility]  Instruction Review Code 1- Verbalizes Understanding      Education: Exercise Physiology & General Exercise Guidelines: - Group verbal and written instruction with models to review the exercise physiology of the cardiovascular system and associated critical values. Provides general exercise guidelines with specific guidelines to those with heart or lung disease.    Education: Flexibility, Balance, Mind/Body Relaxation: Provides group verbal/written instruction on the benefits of flexibility and balance training, including mind/body exercise  modes such as yoga, pilates and tai chi.  Demonstration and skill practice provided.   Activity Barriers & Risk Stratification:  Activity Barriers & Cardiac Risk Stratification - 01/23/20 1635      Activity Barriers & Cardiac Risk Stratification   Activity Barriers Back Problems    Cardiac Risk Stratification High           6 Minute Walk:  6 Minute Walk    Row Name 01/23/20 1618         6 Minute Walk   Phase Initial     Distance 1095 feet     Walk Time 6 minutes     # of Rest Breaks 0     MPH 2.07     METS 2.79     RPE 11     Perceived Dyspnea  0     VO2 Peak 9.79     Symptoms No     Resting HR 70 bpm     Resting BP 142/70  Resting Oxygen Saturation  95 %     Exercise Oxygen Saturation  during 6 min walk 97 %     Max Ex. HR 110 bpm     Max Ex. BP 176/72     2 Minute Post BP 146/68            Oxygen Initial Assessment:   Oxygen Re-Evaluation:   Oxygen Discharge (Final Oxygen Re-Evaluation):   Initial Exercise Prescription:  Initial Exercise Prescription - 01/23/20 1600      Date of Initial Exercise RX and Referring Provider   Date 01/23/20    Referring Provider Kathlyn Sacramento, MD      Treadmill   MPH 1.8    Grade 0    Minutes 15    METs 2.38      NuStep   Level 2    SPM 80    Minutes 15    METs 2.7      Biostep-RELP   Level 2    SPM 80    Minutes 15    METs 2.7      Prescription Details   Frequency (times per week) 2    Duration Progress to 30 minutes of continuous aerobic without signs/symptoms of physical distress      Intensity   THRR 40-80% of Max Heartrate 102-135    Ratings of Perceived Exertion 11-13    Perceived Dyspnea 0-4      Progression   Progression Continue to progress workloads to maintain intensity without signs/symptoms of physical distress.      Resistance Training   Training Prescription Yes           Perform Capillary Blood Glucose checks as needed.  Exercise Prescription Changes:  Exercise  Prescription Changes    Row Name 01/23/20 1600             Response to Exercise   Blood Pressure (Admit) 142/70       Blood Pressure (Exercise) 176/72       Blood Pressure (Exit) 146/68       Heart Rate (Admit) 70 bpm       Heart Rate (Exercise) 110 bpm       Heart Rate (Exit) 70 bpm       Oxygen Saturation (Admit) 95 %       Oxygen Saturation (Exercise) 97 %       Oxygen Saturation (Exit) 96 %       Rating of Perceived Exertion (Exercise) 11       Perceived Dyspnea (Exercise) 0       Symptoms none       Comments walk test results       Duration Progress to 30 minutes of  aerobic without signs/symptoms of physical distress         Resistance Training   Weight 3 lb       Reps 10-15              Exercise Comments:   Exercise Goals and Review:  Exercise Goals    Row Name 01/23/20 1622             Exercise Goals   Increase Physical Activity Yes       Intervention Provide advice, education, support and counseling about physical activity/exercise needs.;Develop an individualized exercise prescription for aerobic and resistive training based on initial evaluation findings, risk stratification, comorbidities and participant's personal goals.       Expected Outcomes Short Term: Attend rehab on a regular  basis to increase amount of physical activity.;Long Term: Add in home exercise to make exercise part of routine and to increase amount of physical activity.;Long Term: Exercising regularly at least 3-5 days a week.       Increase Strength and Stamina Yes       Intervention Provide advice, education, support and counseling about physical activity/exercise needs.;Develop an individualized exercise prescription for aerobic and resistive training based on initial evaluation findings, risk stratification, comorbidities and participant's personal goals.       Expected Outcomes Short Term: Increase workloads from initial exercise prescription for resistance, speed, and METs.;Short  Term: Perform resistance training exercises routinely during rehab and add in resistance training at home;Long Term: Improve cardiorespiratory fitness, muscular endurance and strength as measured by increased METs and functional capacity (6MWT)       Able to understand and use rate of perceived exertion (RPE) scale Yes       Intervention Provide education and explanation on how to use RPE scale       Expected Outcomes Short Term: Able to use RPE daily in rehab to express subjective intensity level;Long Term:  Able to use RPE to guide intensity level when exercising independently       Able to understand and use Dyspnea scale Yes       Intervention Provide education and explanation on how to use Dyspnea scale       Expected Outcomes Short Term: Able to use Dyspnea scale daily in rehab to express subjective sense of shortness of breath during exertion;Long Term: Able to use Dyspnea scale to guide intensity level when exercising independently       Knowledge and understanding of Target Heart Rate Range (THRR) Yes       Intervention Provide education and explanation of THRR including how the numbers were predicted and where they are located for reference       Expected Outcomes Short Term: Able to state/look up THRR;Short Term: Able to use daily as guideline for intensity in rehab;Long Term: Able to use THRR to govern intensity when exercising independently       Able to check pulse independently Yes       Intervention Provide education and demonstration on how to check pulse in carotid and radial arteries.;Review the importance of being able to check your own pulse for safety during independent exercise       Expected Outcomes Short Term: Able to explain why pulse checking is important during independent exercise;Long Term: Able to check pulse independently and accurately       Understanding of Exercise Prescription Yes       Intervention Provide education, explanation, and written materials on patient's  individual exercise prescription       Expected Outcomes Short Term: Able to explain program exercise prescription;Long Term: Able to explain home exercise prescription to exercise independently              Exercise Goals Re-Evaluation :   Discharge Exercise Prescription (Final Exercise Prescription Changes):  Exercise Prescription Changes - 01/23/20 1600      Response to Exercise   Blood Pressure (Admit) 142/70    Blood Pressure (Exercise) 176/72    Blood Pressure (Exit) 146/68    Heart Rate (Admit) 70 bpm    Heart Rate (Exercise) 110 bpm    Heart Rate (Exit) 70 bpm    Oxygen Saturation (Admit) 95 %    Oxygen Saturation (Exercise) 97 %    Oxygen Saturation (Exit) 96 %  Rating of Perceived Exertion (Exercise) 11    Perceived Dyspnea (Exercise) 0    Symptoms none    Comments walk test results    Duration Progress to 30 minutes of  aerobic without signs/symptoms of physical distress      Resistance Training   Weight 3 lb    Reps 10-15           Nutrition:  Target Goals: Understanding of nutrition guidelines, daily intake of sodium <1524m, cholesterol <2045m calories 30% from fat and 7% or less from saturated fats, daily to have 5 or more servings of fruits and vegetables.  Education: Controlling Sodium/Reading Food Labels -Group verbal and written material supporting the discussion of sodium use in heart healthy nutrition. Review and explanation with models, verbal and written materials for utilization of the food label.   Education: General Nutrition Guidelines/Fats and Fiber: -Group instruction provided by verbal, written material, models and posters to present the general guidelines for heart healthy nutrition. Gives an explanation and review of dietary fats and fiber.   Biometrics:  Pre Biometrics - 01/23/20 1620      Pre Biometrics   Height 5' 10.5" (1.791 m)    Weight 235 lb 6.4 oz (106.8 kg)    BMI (Calculated) 33.29    Single Leg Stand 4.32 seconds             Nutrition Therapy Plan and Nutrition Goals:   Nutrition Assessments:  Nutrition Assessments - 01/23/20 1633      MEDFICTS Scores   Pre Score 40           MEDIFICTS Score Key:          ?70 Need to make dietary changes          40-70 Heart Healthy Diet         ? 40 Therapeutic Level Cholesterol Diet  Nutrition Goals Re-Evaluation:   Nutrition Goals Discharge (Final Nutrition Goals Re-Evaluation):   Psychosocial: Target Goals: Acknowledge presence or absence of significant depression and/or stress, maximize coping skills, provide positive support system. Participant is able to verbalize types and ability to use techniques and skills needed for reducing stress and depression.   Education: Depression - Provides group verbal and written instruction on the correlation between heart/lung disease and depressed mood, treatment options, and the stigmas associated with seeking treatment.   Education: Sleep Hygiene -Provides group verbal and written instruction about how sleep can affect your health.  Define sleep hygiene, discuss sleep cycles and impact of sleep habits. Review good sleep hygiene tips.     Education: Stress and Anxiety: - Provides group verbal and written instruction about the health risks of elevated stress and causes of high stress.  Discuss the correlation between heart/lung disease and anxiety and treatment options. Review healthy ways to manage with stress and anxiety.    Initial Review & Psychosocial Screening:  Initial Psych Review & Screening - 01/22/20 1041      Initial Review   Current issues with None Identified      Family Dynamics   Good Support System? Yes    Comments He has alot of family and friends to keep him motivated. He has no stressors at the moment exept getting his health back on track.      Barriers   Psychosocial barriers to participate in program There are no identifiable barriers or psychosocial needs.       Screening Interventions   Interventions Encouraged to exercise;To provide support and resources with identified  psychosocial needs;Provide feedback about the scores to participant    Expected Outcomes Short Term goal: Utilizing psychosocial counselor, staff and physician to assist with identification of specific Stressors or current issues interfering with healing process. Setting desired goal for each stressor or current issue identified.;Long Term Goal: Stressors or current issues are controlled or eliminated.;Short Term goal: Identification and review with participant of any Quality of Life or Depression concerns found by scoring the questionnaire.;Long Term goal: The participant improves quality of Life and PHQ9 Scores as seen by post scores and/or verbalization of changes           Quality of Life Scores:   Quality of Life - 01/23/20 1635      Quality of Life   Select Quality of Life      Quality of Life Scores   Health/Function Pre 23.93 %    Socioeconomic Pre 20.06 %    Psych/Spiritual Pre 23.14 %    Family Pre 24.9 %    GLOBAL Pre 23.02 %          Scores of 19 and below usually indicate a poorer quality of life in these areas.  A difference of  2-3 points is a clinically meaningful difference.  A difference of 2-3 points in the total score of the Quality of Life Index has been associated with significant improvement in overall quality of life, self-image, physical symptoms, and general health in studies assessing change in quality of life.  PHQ-9: Recent Review Flowsheet Data    Depression screen [facility] 2/9 01/23/2020 01/21/2020 11/18/2019 08/28/2018 04/23/2018   Decreased Interest 0 0 0 0 0   Down, Depressed, Hopeless 0 0 0 0 0   PHQ - 2 Score 0 0 0 0 0   Altered sleeping 0 - - - -   Tired, decreased energy 1 - - - -   Change in appetite 0 - - - -   Feeling bad or failure about yourself  0 - - - -   Trouble concentrating 0 - - - -   Moving slowly or fidgety/restless 0 - - - -    Suicidal thoughts 0 - - - -   PHQ-9 Score 1 - - - -   Difficult doing work/chores Not difficult at all - - - -     Interpretation of Total Score  Total Score Depression Severity:  1-4 = Minimal depression, 5-9 = Mild depression, 10-14 = Moderate depression, 15-19 = Moderately severe depression, 20-27 = Severe depression   Psychosocial Evaluation and Intervention:  Psychosocial Evaluation - 01/22/20 1047      Psychosocial Evaluation & Interventions   Interventions Encouraged to exercise with the program and follow exercise prescription;Relaxation education;Stress management education    Comments He has alot of family and friends to keep him motivated. He has no stressors at the moment exept getting his health back on track.    Expected Outcomes Short: Exercise regularly to support mental health and notify staff of any changes. Long: maintain mental health and well being through teaching of rehab or prescribed medications independently.    Continue Psychosocial Services  Follow up required by staff           Psychosocial Re-Evaluation:   Psychosocial Discharge (Final Psychosocial Re-Evaluation):   Vocational Rehabilitation: Provide vocational rehab assistance to qualifying candidates.   Vocational Rehab Evaluation & Intervention:   Education: Education Goals: Education classes will be provided on a variety of topics geared toward better understanding of heart health  and risk factor modification. Participant will state understanding/return demonstration of topics presented as noted by education test scores.  Learning Barriers/Preferences:  Learning Barriers/Preferences - 01/22/20 1040      Learning Barriers/Preferences   Learning Barriers None    Learning Preferences None           General Cardiac Education Topics:  AED/CPR: - Group verbal and written instruction with the use of models to demonstrate the basic use of the AED with the basic ABC's of  resuscitation.   Anatomy & Physiology of the Heart: - Group verbal and written instruction and models provide basic cardiac anatomy and physiology, with the coronary electrical and arterial systems. Review of Valvular disease and Heart Failure   Cardiac Procedures: - Group verbal and written instruction to review commonly prescribed medications for heart disease. Reviews the medication, class of the drug, and side effects. Includes the steps to properly store meds and maintain the prescription regimen. (beta blockers and nitrates)   Cardiac Medications I: - Group verbal and written instruction to review commonly prescribed medications for heart disease. Reviews the medication, class of the drug, and side effects. Includes the steps to properly store meds and maintain the prescription regimen.   Cardiac Medications II: -Group verbal and written instruction to review commonly prescribed medications for heart disease. Reviews the medication, class of the drug, and side effects. (all other drug classes)    Go Sex-Intimacy & Heart Disease, Get SMART - Goal Setting: - Group verbal and written instruction through game format to discuss heart disease and the return to sexual intimacy. Provides group verbal and written material to discuss and apply goal setting through the application of the S.M.A.R.T. Method.   Other Matters of the Heart: - Provides group verbal, written materials and models to describe Stable Angina and Peripheral Artery. Includes description of the disease process and treatment options available to the cardiac patient.   Infection Prevention: - Provides verbal and written material to individual with discussion of infection control including proper hand washing and proper equipment cleaning during exercise session.   Cardiac Rehab from 01/22/2020 in [facility] Cardiac and Pulmonary Rehab  Date 01/22/20  Educator [facility]  Instruction Review Code 1- Verbalizes Understanding       Falls Prevention: - Provides verbal and written material to individual with discussion of falls prevention and safety.   Cardiac Rehab from 01/22/2020 in [facility] Cardiac and Pulmonary Rehab  Date 01/22/20  Educator [facility]  Instruction Review Code 1- Verbalizes Understanding      Other: -Provides group and verbal instruction on various topics (see comments)   Knowledge Questionnaire Score:  Knowledge Questionnaire Score - 01/23/20 1633      Knowledge Questionnaire Score   Pre Score 22/26: PAD, Nutrition, Exercise           Core Components/Risk Factors/Patient Goals at Admission:  Personal Goals and Risk Factors at Admission - 01/23/20 1623      Core Components/Risk Factors/Patient Goals on Admission    Weight Management Yes;Weight Loss    Intervention Weight Management: Develop a combined nutrition and exercise program designed to reach desired caloric intake, while maintaining appropriate intake of nutrient and fiber, sodium and fats, and appropriate energy expenditure required for the weight goal.;Weight Management/Obesity: Establish reasonable short term and long term weight goals.;Weight Management: Provide education and appropriate resources to help participant work on and attain dietary goals.    Admit Weight 235 lb (106.6 kg)    Goal Weight: Short Term 230 lb (104.3  kg)    Goal Weight: Long Term 225 lb (102.1 kg)    Expected Outcomes Short Term: Continue to assess and modify interventions until short term weight is achieved;Long Term: Adherence to nutrition and physical activity/exercise program aimed toward attainment of established weight goal;Weight Loss: Understanding of general recommendations for a balanced deficit meal plan, which promotes 1-2 lb weight loss per week and includes a negative energy balance of [phone] kcal/d;Understanding recommendations for meals to include 15-35% energy as protein, 25-35% energy from fat, 35-60% energy from carbohydrates, less than 255m  of dietary cholesterol, 20-35 gm of total fiber daily;Understanding of distribution of calorie intake throughout the day with the consumption of 4-5 meals/snacks    Tobacco Cessation Yes    Number of packs per day Chewing- everyday    Intervention Assist the participant in steps to quit. Provide individualized education and counseling about committing to Tobacco Cessation, relapse prevention, and pharmacological support that can be provided by physician.;OAdvice worker assist with locating and accessing local/national Quit Smoking programs, and support quit date choice.    Expected Outcomes Short Term: Will demonstrate readiness to quit, by selecting a quit date.;Short Term: Will quit all tobacco product use, adhering to prevention of relapse plan.;Long Term: Complete abstinence from all tobacco products for at least 12 months from quit date.    Diabetes Yes    Intervention Provide education about signs/symptoms and action to take for hypo/hyperglycemia.;Provide education about proper nutrition, including hydration, and aerobic/resistive exercise prescription along with prescribed medications to achieve blood glucose in normal ranges: Fasting glucose 65-99 mg/dL    Expected Outcomes Short Term: Participant verbalizes understanding of the signs/symptoms and immediate care of hyper/hypoglycemia, proper foot care and importance of medication, aerobic/resistive exercise and nutrition plan for blood glucose control.;Long Term: Attainment of HbA1C < 7%.    Hypertension Yes    Intervention Provide education on lifestyle modifcations including regular physical activity/exercise, weight management, moderate sodium restriction and increased consumption of fresh fruit, vegetables, and low fat dairy, alcohol moderation, and smoking cessation.;Monitor prescription use compliance.    Expected Outcomes Short Term: Continued assessment and intervention until BP is < 140/944mHG in hypertensive  participants. < 130/8057mG in hypertensive participants with diabetes, heart failure or chronic kidney disease.;Long Term: Maintenance of blood pressure at goal levels.    Lipids Yes    Intervention Provide education and support for participant on nutrition & aerobic/resistive exercise along with prescribed medications to achieve LDL <28m76mDL >40mg23m Expected Outcomes Short Term: Participant states understanding of desired cholesterol values and is compliant with medications prescribed. Participant is following exercise prescription and nutrition guidelines.;Long Term: Cholesterol controlled with medications as prescribed, with individualized exercise RX and with personalized nutrition plan. Value goals: LDL < 28mg,69m > 40 mg.           Education:Diabetes - Individual verbal and written instruction to review signs/symptoms of diabetes, desired ranges of glucose level fasting, after meals and with exercise. Acknowledge that pre and post exercise glucose checks will be done for 3 sessions at entry of program.   [facility]  Date 01/22/20  [facility]- Verbalizes Understanding      Education: Know Your Numbers and Risk Factors: -Group verbal and written instruction about important numbers in your health.  Discussion of what are risk factors and how they play a role in the disease process.  Review of Cholesterol, Blood  Pressure, Diabetes, and BMI and the role they play in your overall health.   Core Components/Risk Factors/Patient Goals Review:    Core Components/Risk Factors/Patient Goals at Discharge (Final Review):    ITP Comments:  ITP Comments    Row Name 01/22/20 1046 01/23/20 1613 01/23/20 1704       ITP Comments Virtual Visit completed. Patient informed on EP and RD appointment and 6 Minute walk test. Patient also informed of patient health questionnaires on My Chart. Patient Verbalizes  understanding. Visit diagnosis can be found in [facility] 12/27/2019. Completed 6MWT and gym orientation. Initial ITP created and sent for review to Dr. Emily Filbert, Medical Director. Yvone Neu is a current tobacco user. Intervention for tobacco cessation was provided at the initial medical review. He was asked about readiness to quit and reported he has thought about it but is not ready to quit. Patient currently chews tobacco. Patient was advised and educated about tobacco cessation using combination therapy, tobacco cessation classes, quit line, and quit smoking apps. Patient demonstrated understanding of this material. Staff will continue to provide encouragement and follow up with the patient throughout the program.            Comments: Initial ITP

## 2020-01-23 NOTE — Patient Instructions (Addendum)
Patient Instructions  Patient Details  Name: [name] MRN: [id number] Date of Birth: 14-Oct-1950 Referring Provider:  Iran Ouch, MD  Below are your personal goals for exercise, nutrition, and risk factors. Our goal is to help you stay on track towards obtaining and maintaining these goals. We will be discussing your progress on these goals with you throughout the program.  Initial Exercise Prescription:  Initial Exercise Prescription - 01/23/20 1600      Date of Initial Exercise RX and Referring Provider   Date 01/23/20    Referring Provider Lorine Bears, MD      Treadmill   MPH 1.8    Grade 0    Minutes 15    METs 2.38      NuStep   Level 2    SPM 80    Minutes 15    METs 2.7      Biostep-RELP   Level 2    SPM 80    Minutes 15    METs 2.7      Prescription Details   Frequency (times per week) 2    Duration Progress to 30 minutes of continuous aerobic without signs/symptoms of physical distress      Intensity   THRR 40-80% of Max Heartrate 102-135    Ratings of Perceived Exertion 11-13    Perceived Dyspnea 0-4      Progression   Progression Continue to progress workloads to maintain intensity without signs/symptoms of physical distress.      Resistance Training   Training Prescription Yes           Exercise Goals: Frequency: Be able to perform aerobic exercise two to three times per week in program working toward 2-5 days per week of home exercise.  Intensity: Work with a perceived exertion of 11 (fairly light) - 15 (hard) while following your exercise prescription.  We will make changes to your prescription with you as you progress through the program.   Duration: Be able to do 30 to 45 minutes of continuous aerobic exercise in addition to a 5 minute warm-up and a 5 minute cool-down routine.   Nutrition Goals: Your personal nutrition goals will be established when you do your nutrition analysis with the dietician.  The following are  general nutrition guidelines to follow: Cholesterol < 200mg /day Sodium < 1500mg /day Fiber: Men over 50 yrs - 30 grams per day  Personal Goals:  Personal Goals and Risk Factors at Admission - 01/23/20 1623      Core Components/Risk Factors/Patient Goals on Admission    Weight Management Yes;Weight Loss    Intervention Weight Management: Develop a combined nutrition and exercise program designed to reach desired caloric intake, while maintaining appropriate intake of nutrient and fiber, sodium and fats, and appropriate energy expenditure required for the weight goal.;Weight Management/Obesity: Establish reasonable short term and long term weight goals.;Weight Management: Provide education and appropriate resources to help participant work on and attain dietary goals.    Admit Weight 235 lb (106.6 kg)    Goal Weight: Short Term 230 lb (104.3 kg)    Goal Weight: Long Term 225 lb (102.1 kg)    Expected Outcomes Short Term: Continue to assess and modify interventions until short term weight is achieved;Long Term: Adherence to nutrition and physical activity/exercise program aimed toward attainment of established weight goal;Weight Loss: Understanding of general recommendations for a balanced deficit meal plan, which promotes 1-2 lb weight loss per week and includes a negative energy balance of [phone] kcal/d;Understanding  recommendations for meals to include 15-35% energy as protein, 25-35% energy from fat, 35-60% energy from carbohydrates, less than 200mg  of dietary cholesterol, 20-35 gm of total fiber daily;Understanding of distribution of calorie intake throughout the day with the consumption of 4-5 meals/snacks    Tobacco Cessation Yes    Number of packs per day Chewing- everyday    Intervention Assist the participant in steps to quit. Provide individualized education and counseling about committing to Tobacco Cessation, relapse prevention, and pharmacological support that can be provided by  physician.; , assist with locating and accessing local/national Quit Smoking programs, and support quit date choice.    Expected Outcomes Short Term: Will demonstrate readiness to quit, by selecting a quit date.;Short Term: Will quit all tobacco product use, adhering to prevention of relapse plan.;Long Term: Complete abstinence from all tobacco products for at least 12 months from quit date.    Diabetes Yes    Intervention Provide education about signs/symptoms and action to take for hypo/hyperglycemia.;Provide education about proper nutrition, including hydration, and aerobic/resistive exercise prescription along with prescribed medications to achieve blood glucose in normal ranges: Fasting glucose 65-99 mg/dL    Expected Outcomes Short Term: Participant verbalizes understanding of the signs/symptoms and immediate care of hyper/hypoglycemia, proper foot care and importance of medication, aerobic/resistive exercise and nutrition plan for blood glucose control.;Long Term: Attainment of HbA1C < 7%.    Hypertension Yes    Intervention Provide education on lifestyle modifcations including regular physical activity/exercise, weight management, moderate sodium restriction and increased consumption of fresh fruit, vegetables, and low fat dairy, alcohol moderation, and smoking cessation.;Monitor prescription use compliance.    Expected Outcomes Short Term: Continued assessment and intervention until BP is < 140/67mm HG in hypertensive participants. < 130/4mm HG in hypertensive participants with diabetes, heart failure or chronic kidney disease.;Long Term: Maintenance of blood pressure at goal levels.    Lipids Yes    Intervention Provide education and support for participant on nutrition & aerobic/resistive exercise along with prescribed medications to achieve LDL 70mg , HDL >40mg .    Expected Outcomes Short Term: Participant states understanding of desired cholesterol values and is  compliant with medications prescribed. Participant is following exercise prescription and nutrition guidelines.;Long Term: Cholesterol controlled with medications as prescribed, with individualized exercise RX and with personalized nutrition plan. Value goals: LDL < 70mg , HDL > 40 mg.           Tobacco Use Initial Evaluation: Social History   Tobacco Use  Smoking Status Former Smoker  . Types: Cigars  . Quit date: 2000  . Years since quitting: 21.8  Smokeless Tobacco Current User  . Types: Chew  Tobacco Comment   wife states patient never smoked cigarettes, just chewing tobacco    Exercise Goals and Review:  Exercise Goals    Row Name 01/23/20 1622             Exercise Goals   Increase Physical Activity Yes       Intervention Provide advice, education, support and counseling about physical activity/exercise needs.;Develop an individualized exercise prescription for aerobic and resistive training based on initial evaluation findings, risk stratification, comorbidities and participant's personal goals.       Expected Outcomes Short Term: Attend rehab on a regular basis to increase amount of physical activity.;Long Term: Add in home exercise to make exercise part of routine and to increase amount of physical activity.;Long Term: Exercising regularly at least 3-5 days a week.       Increase Strength  and Stamina Yes       Intervention Provide advice, education, support and counseling about physical activity/exercise needs.;Develop an individualized exercise prescription for aerobic and resistive training based on initial evaluation findings, risk stratification, comorbidities and participant's personal goals.       Expected Outcomes Short Term: Increase workloads from initial exercise prescription for resistance, speed, and METs.;Short Term: Perform resistance training exercises routinely during rehab and add in resistance training at home;Long Term: Improve cardiorespiratory fitness,  muscular endurance and strength as measured by increased METs and functional capacity ( )       Able to understand and use rate of perceived exertion (RPE) scale Yes       Intervention Provide education and explanation on how to use RPE scale       Expected Outcomes Short Term: Able to use RPE daily in rehab to express subjective intensity level;Long Term:  Able to use RPE to guide intensity level when exercising independently       Able to understand and use Dyspnea scale Yes       Intervention Provide education and explanation on how to use Dyspnea scale       Expected Outcomes Short Term: Able to use Dyspnea scale daily in rehab to express subjective sense of shortness of breath during exertion;Long Term: Able to use Dyspnea scale to guide intensity level when exercising independently       Knowledge and understanding of Target Heart Rate Range (THRR) Yes       Intervention Provide education and explanation of THRR including how the numbers were predicted and where they are located for reference       Expected Outcomes Short Term: Able to state/look up THRR;Short Term: Able to use daily as guideline for intensity in rehab;Long Term: Able to use THRR to govern intensity when exercising independently       Able to check pulse independently Yes       Intervention Provide education and demonstration on how to check pulse in carotid and radial arteries.;Review the importance of being able to check your own pulse for safety during independent exercise       Expected Outcomes Short Term: Able to explain why pulse checking is important during independent exercise;Long Term: Able to check pulse independently and accurately       Understanding of Exercise Prescription Yes       Intervention Provide education, explanation, and written materials on patient's individual exercise prescription       Expected Outcomes Short Term: Able to explain program exercise prescription;Long Term: Able to explain home  exercise prescription to exercise independently              Copy of goals given to participant.

## 2020-01-29 ENCOUNTER — Encounter: Payer: Self-pay | Admitting: *Deleted

## 2020-01-29 ENCOUNTER — Telehealth: Payer: Self-pay | Admitting: *Deleted

## 2020-01-29 DIAGNOSIS — I214 Non-ST elevation (NSTEMI) myocardial infarction: Secondary | ICD-10-CM

## 2020-01-29 NOTE — Telephone Encounter (Signed)
Kenny's wife called to let us know that he tested positive for COVID-19 on 01/24/20.  We asked for them to stay out for 21 days from positive test date.  He can return on 02/13/20.

## 2020-01-29 NOTE — Progress Notes (Signed)
Cardiac Individual Treatment Plan  Patient Details  Name: [name] MRN: [id number] Date of Birth: [birth year] Referring Provider:     Cardiac Rehab from 01/23/2020 in [facility] Cardiac and Pulmonary Rehab  Referring Provider Kathlyn Sacramento, MD      Initial Encounter Date:    Cardiac Rehab from 01/23/2020 in [facility] Cardiac and Pulmonary Rehab  Date 01/23/20      Visit Diagnosis: NSTEMI (non-ST elevated myocardial infarction) [facility])  Patient's Home Medications on Admission:  Current Outpatient Medications:  .  ACCU-CHEK SMARTVIEW test strip, USE 1 STRIP TO CHECK GLUCOSE TWICE DAILY, Disp: 100 each, Rfl: 12 .  acetaminophen (TYLENOL) 500 MG tablet, Take 1,000 mg by mouth every 8 (eight) hours as needed. , Disp: , Rfl:  .  allopurinol (ZYLOPRIM) 100 MG tablet, Take 1 tablet (100 mg total) by mouth at bedtime., Disp: 90 tablet, Rfl: 3 .  aspirin EC 81 MG EC tablet, Take 1 tablet (81 mg total) by mouth daily. Swallow whole., Disp: 30 tablet, Rfl: 0 .  atorvastatin (LIPITOR) 40 MG tablet, Take 1 tablet (40 mg total) by mouth daily., Disp: 90 tablet, Rfl: 1 .  BYDUREON BCISE 2 MG/0.85ML AUIJ, INJECT 2 MG SUBCUTANEOUSLY ONCE A WEEK (Patient taking differently: Inject 2 mg into the skin once a week. ), Disp: 12 mL, Rfl: 3 .  calcium carbonate (TUMS - DOSED IN MG ELEMENTAL CALCIUM) 500 MG chewable tablet, Chew 2 tablets by mouth at bedtime as needed for indigestion or heartburn. , Disp: , Rfl:  .  carvedilol (COREG) 12.5 MG tablet, Take 1 tablet (12.5 mg total) by mouth 2 (two) times daily., Disp: 180 tablet, Rfl: 3 .  docusate sodium (COLACE) 100 MG capsule, Take 100 mg by mouth 2 (two) times daily.  (Patient not taking: Reported on 01/13/2020), Disp: , Rfl:  .  ezetimibe (ZETIA) 10 MG tablet, Take 1 tablet (10 mg total) by mouth daily., Disp: 90 tablet, Rfl: 1 .  fluticasone (FLONASE) 50 MCG/ACT nasal spray, USE 2 SPRAYS IN EACH NOSTRIL ONCE DAILY USE FOR 4 TO 6 WEEKS, THEN STOP AND USE  SEASONALLY OR AS NEEDED., Disp: 48 g, Rfl: 0 .  Insulin Glargine ([name] KWIKPEN) 100 UNIT/ML, Inject 10 Units into the skin at bedtime. (Patient taking differently: Inject 11 Units into the skin at bedtime. ), Disp: , Rfl:  .  Insulin Pen Needle (NOVOFINE PLUS) 32G X 4 MM MISC, Use with [name] insulin daily injection as instructed, Disp: 90 each, Rfl: 3 .  lisinopril (ZESTRIL) 40 MG tablet, Take 1 tablet (40 mg total) by mouth daily., Disp: 90 tablet, Rfl: 3 .  nitroGLYCERIN (NITROSTAT) 0.4 MG SL tablet, Place 1 tablet (0.4 mg total) under the tongue every 5 (five) minutes as needed for chest pain., Disp: 30 tablet, Rfl: 0 .  omeprazole (PRILOSEC) 20 MG capsule, Take 1 capsule (20 mg total) by mouth daily before breakfast., Disp: 90 capsule, Rfl: 3 .  ticagrelor (BRILINTA) 90 MG TABS tablet, Take 1 tablet (90 mg total) by mouth 2 (two) times daily., Disp: 180 tablet, Rfl: 1  Past Medical History: Past Medical History:  Diagnosis Date  . Allergy   . Chronic headaches   . GERD (gastroesophageal reflux disease)   . Heart burn   . Hyperlipidemia   . Hypertension     Tobacco Use: Social History   Tobacco Use  Smoking Status Former Smoker  . Types: Cigars  . Quit date: 2000  . Years since quitting: 21.8  Smokeless  Tobacco Current User  . Types: Chew  Tobacco Comment   wife states patient never smoked cigarettes, just chewing tobacco    Labs: Recent Review Flowsheet Data    Labs for ITP Cardiac and Pulmonary Rehab Latest Ref Rng & Units 11/29/2018 01/11/2019 11/12/2019 12/27/2019 12/29/2019   Cholestrol 0 - 200 mg/dL 200(H) - 212(H) - 147   LDLCALC 0 - 99 mg/dL 134(H) - 139(H) - 81   HDL >40 mg/dL 36(L) - 32(L) - 29(L)   Trlycerides <150 mg/dL 168(H) - 228(H) - 186(H)   Hemoglobin A1c 4.8 - 5.6 % 7.4(H) 7.8 8.9(H) 8.7(H) -       Exercise Target Goals: Exercise Program Goal: Individual exercise prescription set using results from initial 6 min walk test and THRR while  considering  patient's activity barriers and safety.   Exercise Prescription Goal: Initial exercise prescription builds to 30-45 minutes a day of aerobic activity, 2-3 days per week.  Home exercise guidelines will be given to patient during program as part of exercise prescription that the participant will acknowledge.   Education: Aerobic Exercise & Resistance Training: - Gives group verbal and written instruction on the various components of exercise. Focuses on aerobic and resistive training programs and the benefits of this training and how to safely progress through these programs..   Education: Exercise & Equipment Safety: - Individual verbal instruction and demonstration of equipment use and safety with use of the equipment.   Cardiac Rehab from 01/22/2020 in [facility] Cardiac and Pulmonary Rehab  Date 01/22/20  Educator [facility]  Instruction Review Code 1- Verbalizes Understanding      Education: Exercise Physiology & General Exercise Guidelines: - Group verbal and written instruction with models to review the exercise physiology of the cardiovascular system and associated critical values. Provides general exercise guidelines with specific guidelines to those with heart or lung disease.    Education: Flexibility, Balance, Mind/Body Relaxation: Provides group verbal/written instruction on the benefits of flexibility and balance training, including mind/body exercise modes such as yoga, pilates and tai chi.  Demonstration and skill practice provided.   Activity Barriers & Risk Stratification:  Activity Barriers & Cardiac Risk Stratification - 01/23/20 1635      Activity Barriers & Cardiac Risk Stratification   Activity Barriers Back Problems    Cardiac Risk Stratification High           6 Minute Walk:  6 Minute Walk    Row Name 01/23/20 1618         6 Minute Walk   Phase Initial     Distance 1095 feet     Walk Time 6 minutes     # of Rest Breaks 0     MPH 2.07     METS  2.79     RPE 11     Perceived Dyspnea  0     VO2 Peak 9.79     Symptoms No     Resting HR 70 bpm     Resting BP 142/70     Resting Oxygen Saturation  95 %     Exercise Oxygen Saturation  during 6 min walk 97 %     Max Ex. HR 110 bpm     Max Ex. BP 176/72     2 Minute Post BP 146/68            Oxygen Initial Assessment:   Oxygen Re-Evaluation:   Oxygen Discharge (Final Oxygen Re-Evaluation):   Initial Exercise Prescription:  Initial Exercise Prescription -  01/23/20 1600      Date of Initial Exercise RX and Referring Provider   Date 01/23/20    Referring Provider Kathlyn Sacramento, MD      Treadmill   MPH 1.8    Grade 0    Minutes 15    METs 2.38      NuStep   Level 2    SPM 80    Minutes 15    METs 2.7      Biostep-RELP   Level 2    SPM 80    Minutes 15    METs 2.7      Prescription Details   Frequency (times per week) 2    Duration Progress to 30 minutes of continuous aerobic without signs/symptoms of physical distress      Intensity   THRR 40-80% of Max Heartrate 102-135    Ratings of Perceived Exertion 11-13    Perceived Dyspnea 0-4      Progression   Progression Continue to progress workloads to maintain intensity without signs/symptoms of physical distress.      Resistance Training   Training Prescription Yes           Perform Capillary Blood Glucose checks as needed.  Exercise Prescription Changes:  Exercise Prescription Changes    Row Name 01/23/20 1600             Response to Exercise   Blood Pressure (Admit) 142/70       Blood Pressure (Exercise) 176/72       Blood Pressure (Exit) 146/68       Heart Rate (Admit) 70 bpm       Heart Rate (Exercise) 110 bpm       Heart Rate (Exit) 70 bpm       Oxygen Saturation (Admit) 95 %       Oxygen Saturation (Exercise) 97 %       Oxygen Saturation (Exit) 96 %       Rating of Perceived Exertion (Exercise) 11       Perceived Dyspnea (Exercise) 0       Symptoms none       Comments walk  test results       Duration Progress to 30 minutes of  aerobic without signs/symptoms of physical distress         Resistance Training   Weight 3 lb       Reps 10-15              Exercise Comments:   Exercise Goals and Review:  Exercise Goals    Row Name 01/23/20 1622             Exercise Goals   Increase Physical Activity Yes       Intervention Provide advice, education, support and counseling about physical activity/exercise needs.;Develop an individualized exercise prescription for aerobic and resistive training based on initial evaluation findings, risk stratification, comorbidities and participant's personal goals.       Expected Outcomes Short Term: Attend rehab on a regular basis to increase amount of physical activity.;Long Term: Add in home exercise to make exercise part of routine and to increase amount of physical activity.;Long Term: Exercising regularly at least 3-5 days a week.       Increase Strength and Stamina Yes       Intervention Provide advice, education, support and counseling about physical activity/exercise needs.;Develop an individualized exercise prescription for aerobic and resistive training based on initial evaluation findings, risk stratification, comorbidities and participant's  personal goals.       Expected Outcomes Short Term: Increase workloads from initial exercise prescription for resistance, speed, and METs.;Short Term: Perform resistance training exercises routinely during rehab and add in resistance training at home;Long Term: Improve cardiorespiratory fitness, muscular endurance and strength as measured by increased METs and functional capacity ( )       Able to understand and use rate of perceived exertion (RPE) scale Yes       Intervention Provide education and explanation on how to use RPE scale       Expected Outcomes Short Term: Able to use RPE daily in rehab to express subjective intensity level;Long Term:  Able to use RPE to guide  intensity level when exercising independently       Able to understand and use Dyspnea scale Yes       Intervention Provide education and explanation on how to use Dyspnea scale       Expected Outcomes Short Term: Able to use Dyspnea scale daily in rehab to express subjective sense of shortness of breath during exertion;Long Term: Able to use Dyspnea scale to guide intensity level when exercising independently       Knowledge and understanding of Target Heart Rate Range (THRR) Yes       Intervention Provide education and explanation of THRR including how the numbers were predicted and where they are located for reference       Expected Outcomes Short Term: Able to state/look up THRR;Short Term: Able to use daily as guideline for intensity in rehab;Long Term: Able to use THRR to govern intensity when exercising independently       Able to check pulse independently Yes       Intervention Provide education and demonstration on how to check pulse in carotid and radial arteries.;Review the importance of being able to check your own pulse for safety during independent exercise       Expected Outcomes Short Term: Able to explain why pulse checking is important during independent exercise;Long Term: Able to check pulse independently and accurately       Understanding of Exercise Prescription Yes       Intervention Provide education, explanation, and written materials on patient's individual exercise prescription       Expected Outcomes Short Term: Able to explain program exercise prescription;Long Term: Able to explain home exercise prescription to exercise independently              Exercise Goals Re-Evaluation :   Discharge Exercise Prescription (Final Exercise Prescription Changes):  Exercise Prescription Changes - 01/23/20 1600      Response to Exercise   Blood Pressure (Admit) 142/70    Blood Pressure (Exercise) 176/72    Blood Pressure (Exit) 146/68    Heart Rate (Admit) 70 bpm    Heart  Rate (Exercise) 110 bpm    Heart Rate (Exit) 70 bpm    Oxygen Saturation (Admit) 95 %    Oxygen Saturation (Exercise) 97 %    Oxygen Saturation (Exit) 96 %    Rating of Perceived Exertion (Exercise) 11    Perceived Dyspnea (Exercise) 0    Symptoms none    Comments walk test results    Duration Progress to 30 minutes of  aerobic without signs/symptoms of physical distress      Resistance Training   Weight 3 lb    Reps 10-15           Nutrition:  Target Goals: Understanding of nutrition guidelines, daily intake  of sodium '1500mg'$ , cholesterol '200mg'$ , calories 30% from fat and 7% or less from saturated fats, daily to have 5 or more servings of fruits and vegetables.  Education: Controlling Sodium/Reading Food Labels -Group verbal and written material supporting the discussion of sodium use in heart healthy nutrition. Review and explanation with models, verbal and written materials for utilization of the food label.   Education: General Nutrition Guidelines/Fats and Fiber: -Group instruction provided by verbal, written material, models and posters to present the general guidelines for heart healthy nutrition. Gives an explanation and review of dietary fats and fiber.   Biometrics:  Pre Biometrics - 01/23/20 1620      Pre Biometrics   Height 5' 10.5" (1.791 m)    Weight 235 lb 6.4 oz (106.8 kg)    BMI (Calculated) 33.29    Single Leg Stand 4.32 seconds            Nutrition Therapy Plan and Nutrition Goals:   Nutrition Assessments:  Nutrition Assessments - 01/23/20 1633      MEDFICTS Scores   Pre Score 40           MEDIFICTS Score Key:          ?70 Need to make dietary changes          40-70 Heart Healthy Diet         ? 40 Therapeutic Level Cholesterol Diet  Nutrition Goals Re-Evaluation:   Nutrition Goals Discharge (Final Nutrition Goals Re-Evaluation):   Psychosocial: Target Goals: Acknowledge presence or absence of significant depression and/or stress,  maximize coping skills, provide positive support system. Participant is able to verbalize types and ability to use techniques and skills needed for reducing stress and depression.   Education: Depression - Provides group verbal and written instruction on the correlation between heart/lung disease and depressed mood, treatment options, and the stigmas associated with seeking treatment.   Education: Sleep Hygiene -Provides group verbal and written instruction about how sleep can affect your health.  Define sleep hygiene, discuss sleep cycles and impact of sleep habits. Review good sleep hygiene tips.     Education: Stress and Anxiety: - Provides group verbal and written instruction about the health risks of elevated stress and causes of high stress.  Discuss the correlation between heart/lung disease and anxiety and treatment options. Review healthy ways to manage with stress and anxiety.    Initial Review & Psychosocial Screening:  Initial Psych Review & Screening - 01/22/20 1041      Initial Review   Current issues with None Identified      Family Dynamics   Good Support System? Yes    Comments He has alot of family and friends to keep him motivated. He has no stressors at the moment exept getting his health back on track.      Barriers   Psychosocial barriers to participate in program There are no identifiable barriers or psychosocial needs.      Screening Interventions   Interventions Encouraged to exercise;To provide support and resources with identified psychosocial needs;Provide feedback about the scores to participant    Expected Outcomes Short Term goal: Utilizing psychosocial counselor, staff and physician to assist with identification of specific Stressors or current issues interfering with healing process. Setting desired goal for each stressor or current issue identified.;Long Term Goal: Stressors or current issues are controlled or eliminated.;Short Term goal:  Identification and review with participant of any Quality of Life or Depression concerns found by scoring the questionnaire.;Long Term goal:  The participant improves quality of Life and PHQ9 Scores as seen by post scores and/or verbalization of changes           Quality of Life Scores:   Quality of Life - 01/23/20 1635      Quality of Life   Select Quality of Life      Quality of Life Scores   Health/Function Pre 23.93 %    Socioeconomic Pre 20.06 %    Psych/Spiritual Pre 23.14 %    Family Pre 24.9 %    GLOBAL Pre 23.02 %          Scores of 19 and below usually indicate a poorer quality of life in these areas.  A difference of  2-3 points is a clinically meaningful difference.  A difference of 2-3 points in the total score of the Quality of Life Index has been associated with significant improvement in overall quality of life, self-image, physical symptoms, and general health in studies assessing change in quality of life.  PHQ-9: Recent Review Flowsheet Data    Depression screen [facility] 2/9 01/23/2020 01/21/2020 11/18/2019 08/28/2018 04/23/2018   Decreased Interest 0 0 0 0 0   Down, Depressed, Hopeless 0 0 0 0 0   PHQ - 2 Score 0 0 0 0 0   Altered sleeping 0 - - - -   Tired, decreased energy 1 - - - -   Change in appetite 0 - - - -   Feeling bad or failure about yourself  0 - - - -   Trouble concentrating 0 - - - -   Moving slowly or fidgety/restless 0 - - - -   Suicidal thoughts 0 - - - -   PHQ-9 Score 1 - - - -   Difficult doing work/chores Not difficult at all - - - -     Interpretation of Total Score  Total Score Depression Severity:  1-4 = Minimal depression, 5-9 = Mild depression, 10-14 = Moderate depression, 15-19 = Moderately severe depression, 20-27 = Severe depression   Psychosocial Evaluation and Intervention:  Psychosocial Evaluation - 01/22/20 1047      Psychosocial Evaluation & Interventions   Interventions Encouraged to exercise with the program and follow  exercise prescription;Relaxation education;Stress management education    Comments He has alot of family and friends to keep him motivated. He has no stressors at the moment exept getting his health back on track.    Expected Outcomes Short: Exercise regularly to support mental health and notify staff of any changes. Long: maintain mental health and well being through teaching of rehab or prescribed medications independently.    Continue Psychosocial Services  Follow up required by staff           Psychosocial Re-Evaluation:   Psychosocial Discharge (Final Psychosocial Re-Evaluation):   Vocational Rehabilitation: Provide vocational rehab assistance to qualifying candidates.   Vocational Rehab Evaluation & Intervention:   Education: Education Goals: Education classes will be provided on a variety of topics geared toward better understanding of heart health and risk factor modification. Participant will state understanding/return demonstration of topics presented as noted by education test scores.  Learning Barriers/Preferences:  Learning Barriers/Preferences - 01/22/20 1040      Learning Barriers/Preferences   Learning Barriers None    Learning Preferences None           General Cardiac Education Topics:  AED/CPR: - Group verbal and written instruction with the use of models to demonstrate the basic use of  the AED with the basic ABC's of resuscitation.   Anatomy & Physiology of the Heart: - Group verbal and written instruction and models provide basic cardiac anatomy and physiology, with the coronary electrical and arterial systems. Review of Valvular disease and Heart Failure   Cardiac Procedures: - Group verbal and written instruction to review commonly prescribed medications for heart disease. Reviews the medication, class of the drug, and side effects. Includes the steps to properly store meds and maintain the prescription regimen. (beta blockers and  nitrates)   Cardiac Medications I: - Group verbal and written instruction to review commonly prescribed medications for heart disease. Reviews the medication, class of the drug, and side effects. Includes the steps to properly store meds and maintain the prescription regimen.   Cardiac Medications II: -Group verbal and written instruction to review commonly prescribed medications for heart disease. Reviews the medication, class of the drug, and side effects. (all other drug classes)    Go Sex-Intimacy & Heart Disease, Get SMART - Goal Setting: - Group verbal and written instruction through game format to discuss heart disease and the return to sexual intimacy. Provides group verbal and written material to discuss and apply goal setting through the application of the S.M.A.R.T. Method.   Other Matters of the Heart: - Provides group verbal, written materials and models to describe Stable Angina and Peripheral Artery. Includes description of the disease process and treatment options available to the cardiac patient.   Infection Prevention: - Provides verbal and written material to individual with discussion of infection control including proper hand washing and proper equipment cleaning during exercise session.   Cardiac Rehab from 01/22/2020 in [facility] Cardiac and Pulmonary Rehab  Date 01/22/20  Educator [facility]  Instruction Review Code 1- Verbalizes Understanding      Falls Prevention: - Provides verbal and written material to individual with discussion of falls prevention and safety.   Cardiac Rehab from 01/22/2020 in [facility] Cardiac and Pulmonary Rehab  Date 01/22/20  Educator [facility]  Instruction Review Code 1- Verbalizes Understanding      Other: -Provides group and verbal instruction on various topics (see comments)   Knowledge Questionnaire Score:  Knowledge Questionnaire Score - 01/23/20 1633      Knowledge Questionnaire Score   Pre Score 22/26: PAD, Nutrition, Exercise            Core Components/Risk Factors/Patient Goals at Admission:  Personal Goals and Risk Factors at Admission - 01/23/20 1623      Core Components/Risk Factors/Patient Goals on Admission    Weight Management Yes;Weight Loss    Intervention Weight Management: Develop a combined nutrition and exercise program designed to reach desired caloric intake, while maintaining appropriate intake of nutrient and fiber, sodium and fats, and appropriate energy expenditure required for the weight goal.;Weight Management/Obesity: Establish reasonable short term and long term weight goals.;Weight Management: Provide education and appropriate resources to help participant work on and attain dietary goals.    Admit Weight 235 lb (106.6 kg)    Goal Weight: Short Term 230 lb (104.3 kg)    Goal Weight: Long Term 225 lb (102.1 kg)    Expected Outcomes Short Term: Continue to assess and modify interventions until short term weight is achieved;Long Term: Adherence to nutrition and physical activity/exercise program aimed toward attainment of established weight goal;Weight Loss: Understanding of general recommendations for a balanced deficit meal plan, which promotes 1-2 lb weight loss per week and includes a negative energy balance of [phone] kcal/d;Understanding recommendations for meals to  include 15-35% energy as protein, 25-35% energy from fat, 35-60% energy from carbohydrates, less than $RemoveB'200mg'TIYbzNhe$  of dietary cholesterol, 20-35 gm of total fiber daily;Understanding of distribution of calorie intake throughout the day with the consumption of 4-5 meals/snacks    Tobacco Cessation Yes    Number of packs per day Chewing- everyday    Intervention Assist the participant in steps to quit. Provide individualized education and counseling about committing to Tobacco Cessation, relapse prevention, and pharmacological support that can be provided by physician.;Advice worker, assist with locating and accessing local/national  Quit Smoking programs, and support quit date choice.    Expected Outcomes Short Term: Will demonstrate readiness to quit, by selecting a quit date.;Short Term: Will quit all tobacco product use, adhering to prevention of relapse plan.;Long Term: Complete abstinence from all tobacco products for at least 12 months from quit date.    Diabetes Yes    Intervention Provide education about signs/symptoms and action to take for hypo/hyperglycemia.;Provide education about proper nutrition, including hydration, and aerobic/resistive exercise prescription along with prescribed medications to achieve blood glucose in normal ranges: Fasting glucose 65-99 mg/dL    Expected Outcomes Short Term: Participant verbalizes understanding of the signs/symptoms and immediate care of hyper/hypoglycemia, proper foot care and importance of medication, aerobic/resistive exercise and nutrition plan for blood glucose control.;Long Term: Attainment of HbA1C < 7%.    Hypertension Yes    Intervention Provide education on lifestyle modifcations including regular physical activity/exercise, weight management, moderate sodium restriction and increased consumption of fresh fruit, vegetables, and low fat dairy, alcohol moderation, and smoking cessation.;Monitor prescription use compliance.    Expected Outcomes Short Term: Continued assessment and intervention until BP is < 140/33mm HG in hypertensive participants. < 130/68mm HG in hypertensive participants with diabetes, heart failure or chronic kidney disease.;Long Term: Maintenance of blood pressure at goal levels.    Lipids Yes    Intervention Provide education and support for participant on nutrition & aerobic/resistive exercise along with prescribed medications to achieve LDL '70mg'$ , HDL >$Remo'40mg'fwUfD$ .    Expected Outcomes Short Term: Participant states understanding of desired cholesterol values and is compliant with medications prescribed. Participant is following exercise prescription and  nutrition guidelines.;Long Term: Cholesterol controlled with medications as prescribed, with individualized exercise RX and with personalized nutrition plan. Value goals: LDL < $Rem'70mg'KMcb$ , HDL > 40 mg.           Education:Diabetes - Individual verbal and written instruction to review signs/symptoms of diabetes, desired ranges of glucose level fasting, after meals and with exercise. Acknowledge that pre and post exercise glucose checks will be done for 3 sessions at entry of program.   Cardiac Rehab from 01/22/2020 in [facility]  Date 01/22/20  Educator [facility]  Instruction Review Code 1- Verbalizes Understanding      Education: Know Your Numbers and Risk Factors: -Group verbal and written instruction about important numbers in your health.  Discussion of what are risk factors and how they play a role in the disease process.  Review of Cholesterol, Blood Pressure, Diabetes, and BMI and the role they play in your overall health.   Core Components/Risk Factors/Patient Goals Review:    Core Components/Risk Factors/Patient Goals at Discharge (Final Review):    ITP Comments:  ITP Comments    Row Name 01/22/20 1046 01/23/20 1613 01/23/20 1704 01/29/20 0654     ITP Comments Virtual Visit completed. Patient informed on EP and RD appointment and 6 Minute walk test. Patient also informed of patient  health questionnaires on My Chart. Patient Verbalizes understanding. Visit diagnosis can be found in [facility] 12/27/2019. Completed 6MWT and gym orientation. Initial ITP created and sent for review to Dr. Emily Filbert, Medical Director. Yvone Neu is a current tobacco user. Intervention for tobacco cessation was provided at the initial medical review. He was asked about readiness to quit and reported he has thought about it but is not ready to quit. Patient currently chews tobacco. Patient was advised and educated about tobacco cessation using combination therapy, tobacco cessation classes, quit line, and  quit smoking apps. Patient demonstrated understanding of this material. Staff will continue to provide encouragement and follow up with the patient throughout the program. 30 Day review completed. Medical Director ITP review done, changes made as directed, and signed approval by Medical Director.           Comments:

## 2020-01-29 NOTE — Progress Notes (Signed)
Cardiac Individual Treatment Plan  Patient Details  Name: Vihan Santagata MRN: [id number] Date of Birth: [birth year] Referring Provider:     Cardiac Rehab from 01/23/2020 in [facility] Cardiac and Pulmonary Rehab  Referring Provider Kathlyn Sacramento, MD      Initial Encounter Date:    Cardiac Rehab from 01/23/2020 in [facility] Cardiac and Pulmonary Rehab  Date 01/23/20      Visit Diagnosis: NSTEMI (non-ST elevated myocardial infarction) [facility])  Patient's Home Medications on Admission:  Current Outpatient Medications:  .  ACCU-CHEK SMARTVIEW test strip, USE 1 STRIP TO CHECK GLUCOSE TWICE DAILY, Disp: 100 each, Rfl: 12 .  acetaminophen (TYLENOL) 500 MG tablet, Take 1,000 mg by mouth every 8 (eight) hours as needed. , Disp: , Rfl:  .  allopurinol (ZYLOPRIM) 100 MG tablet, Take 1 tablet (100 mg total) by mouth at bedtime., Disp: 90 tablet, Rfl: 3 .  aspirin EC 81 MG EC tablet, Take 1 tablet (81 mg total) by mouth daily. Swallow whole., Disp: 30 tablet, Rfl: 0 .  atorvastatin (LIPITOR) 40 MG tablet, Take 1 tablet (40 mg total) by mouth daily., Disp: 90 tablet, Rfl: 1 .  BYDUREON BCISE 2 MG/0.85ML AUIJ, INJECT 2 MG SUBCUTANEOUSLY ONCE A WEEK (Patient taking differently: Inject 2 mg into the skin once a week. ), Disp: 12 mL, Rfl: 3 .  calcium carbonate (TUMS - DOSED IN MG ELEMENTAL CALCIUM) 500 MG chewable tablet, Chew 2 tablets by mouth at bedtime as needed for indigestion or heartburn. , Disp: , Rfl:  .  carvedilol (COREG) 12.5 MG tablet, Take 1 tablet (12.5 mg total) by mouth 2 (two) times daily., Disp: 180 tablet, Rfl: 3 .  docusate sodium (COLACE) 100 MG capsule, Take 100 mg by mouth 2 (two) times daily.  (Patient not taking: Reported on 01/13/2020), Disp: , Rfl:  .  ezetimibe (ZETIA) 10 MG tablet, Take 1 tablet (10 mg total) by mouth daily., Disp: 90 tablet, Rfl: 1 .  fluticasone (FLONASE) 50 MCG/ACT nasal spray, USE 2 SPRAYS IN EACH NOSTRIL ONCE DAILY USE FOR 4 TO 6 WEEKS, THEN STOP AND USE  SEASONALLY OR AS NEEDED., Disp: 48 g, Rfl: 0 .  Insulin Glargine ([name] KWIKPEN) 100 UNIT/ML, Inject 10 Units into the skin at bedtime. (Patient taking differently: Inject 11 Units into the skin at bedtime. ), Disp: , Rfl:  .  Insulin Pen Needle (NOVOFINE PLUS) 32G X 4 MM MISC, Use with [name] insulin daily injection as instructed, Disp: 90 each, Rfl: 3 .  lisinopril (ZESTRIL) 40 MG tablet, Take 1 tablet (40 mg total) by mouth daily., Disp: 90 tablet, Rfl: 3 .  nitroGLYCERIN (NITROSTAT) 0.4 MG SL tablet, Place 1 tablet (0.4 mg total) under the tongue every 5 (five) minutes as needed for chest pain., Disp: 30 tablet, Rfl: 0 .  omeprazole (PRILOSEC) 20 MG capsule, Take 1 capsule (20 mg total) by mouth daily before breakfast., Disp: 90 capsule, Rfl: 3 .  ticagrelor (BRILINTA) 90 MG TABS tablet, Take 1 tablet (90 mg total) by mouth 2 (two) times daily., Disp: 180 tablet, Rfl: 1  Past Medical History: Past Medical History:  Diagnosis Date  . Allergy   . Chronic headaches   . GERD (gastroesophageal reflux disease)   . Heart burn   . Hyperlipidemia   . Hypertension     Tobacco Use: Social History   Tobacco Use  Smoking Status Former Smoker  . Types: Cigars  . Quit date: 2000  . Years since quitting: 21.8  Smokeless  Tobacco Current User  . Types: Chew  Tobacco Comment   wife states patient never smoked cigarettes, just chewing tobacco    Labs: Recent Review Flowsheet Data    Labs for ITP Cardiac and Pulmonary Rehab Latest Ref Rng & Units 11/29/2018 01/11/2019 11/12/2019 12/27/2019 12/29/2019   Cholestrol 0 - 200 mg/dL 200(H) - 212(H) - 147   LDLCALC 0 - 99 mg/dL 134(H) - 139(H) - 81   HDL >40 mg/dL 36(L) - 32(L) - 29(L)   Trlycerides <150 mg/dL 168(H) - 228(H) - 186(H)   Hemoglobin A1c 4.8 - 5.6 % 7.4(H) 7.8 8.9(H) 8.7(H) -       Exercise Target Goals: Exercise Program Goal: Individual exercise prescription set using results from initial 6 min walk test and THRR while  considering  patient's activity barriers and safety.   Exercise Prescription Goal: Initial exercise prescription builds to 30-45 minutes a day of aerobic activity, 2-3 days per week.  Home exercise guidelines will be given to patient during program as part of exercise prescription that the participant will acknowledge.   Education: Aerobic Exercise & Resistance Training: - Gives group verbal and written instruction on the various components of exercise. Focuses on aerobic and resistive training programs and the benefits of this training and how to safely progress through these programs..   Education: Exercise & Equipment Safety: - Individual verbal instruction and demonstration of equipment use and safety with use of the equipment.   Cardiac Rehab from 01/22/2020 in [facility] Cardiac and Pulmonary Rehab  Date 01/22/20  Educator [facility]  Instruction Review Code 1- Verbalizes Understanding      Education: Exercise Physiology & General Exercise Guidelines: - Group verbal and written instruction with models to review the exercise physiology of the cardiovascular system and associated critical values. Provides general exercise guidelines with specific guidelines to those with heart or lung disease.    Education: Flexibility, Balance, Mind/Body Relaxation: Provides group verbal/written instruction on the benefits of flexibility and balance training, including mind/body exercise modes such as yoga, pilates and tai chi.  Demonstration and skill practice provided.   Activity Barriers & Risk Stratification:  Activity Barriers & Cardiac Risk Stratification - 01/23/20 1635      Activity Barriers & Cardiac Risk Stratification   Activity Barriers Back Problems    Cardiac Risk Stratification High           6 Minute Walk:  6 Minute Walk    Row Name 01/23/20 1618         6 Minute Walk   Phase Initial     Distance 1095 feet     Walk Time 6 minutes     # of Rest Breaks 0     MPH 2.07     METS  2.79     RPE 11     Perceived Dyspnea  0     VO2 Peak 9.79     Symptoms No     Resting HR 70 bpm     Resting BP 142/70     Resting Oxygen Saturation  95 %     Exercise Oxygen Saturation  during 6 min walk 97 %     Max Ex. HR 110 bpm     Max Ex. BP 176/72     2 Minute Post BP 146/68            Oxygen Initial Assessment:   Oxygen Re-Evaluation:   Oxygen Discharge (Final Oxygen Re-Evaluation):   Initial Exercise Prescription:  Initial Exercise Prescription -  01/23/20 1600      Date of Initial Exercise RX and Referring Provider   Date 01/23/20    Referring Provider Lorine Bears, MD      Treadmill   MPH 1.8    Grade 0    Minutes 15    METs 2.38      NuStep   Level 2    SPM 80    Minutes 15    METs 2.7      Biostep-RELP   Level 2    SPM 80    Minutes 15    METs 2.7      Prescription Details   Frequency (times per week) 2    Duration Progress to 30 minutes of continuous aerobic without signs/symptoms of physical distress      Intensity   THRR 40-80% of Max Heartrate 102-135    Ratings of Perceived Exertion 11-13    Perceived Dyspnea 0-4      Progression   Progression Continue to progress workloads to maintain intensity without signs/symptoms of physical distress.      Resistance Training   Training Prescription Yes           Perform Capillary Blood Glucose checks as needed.  Exercise Prescription Changes:  Exercise Prescription Changes    Row Name 01/23/20 1600             Response to Exercise   Blood Pressure (Admit) 142/70       Blood Pressure (Exercise) 176/72       Blood Pressure (Exit) 146/68       Heart Rate (Admit) 70 bpm       Heart Rate (Exercise) 110 bpm       Heart Rate (Exit) 70 bpm       Oxygen Saturation (Admit) 95 %       Oxygen Saturation (Exercise) 97 %       Oxygen Saturation (Exit) 96 %       Rating of Perceived Exertion (Exercise) 11       Perceived Dyspnea (Exercise) 0       Symptoms none       Comments walk  test results       Duration Progress to 30 minutes of  aerobic without signs/symptoms of physical distress         Resistance Training   Weight 3 lb       Reps 10-15              Exercise Comments:   Exercise Goals and Review:  Exercise Goals    Row Name 01/23/20 1622             Exercise Goals   Increase Physical Activity Yes       Intervention Provide advice, education, support and counseling about physical activity/exercise needs.;Develop an individualized exercise prescription for aerobic and resistive training based on initial evaluation findings, risk stratification, comorbidities and participant's personal goals.       Expected Outcomes Short Term: Attend rehab on a regular basis to increase amount of physical activity.;Long Term: Add in home exercise to make exercise part of routine and to increase amount of physical activity.;Long Term: Exercising regularly at least 3-5 days a week.       Increase Strength and Stamina Yes       Intervention Provide advice, education, support and counseling about physical activity/exercise needs.;Develop an individualized exercise prescription for aerobic and resistive training based on initial evaluation findings, risk stratification, comorbidities and participant's  personal goals.       Expected Outcomes Short Term: Increase workloads from initial exercise prescription for resistance, speed, and METs.;Short Term: Perform resistance training exercises routinely during rehab and add in resistance training at home;Long Term: Improve cardiorespiratory fitness, muscular endurance and strength as measured by increased METs and functional capacity ( )       Able to understand and use rate of perceived exertion (RPE) scale Yes       Intervention Provide education and explanation on how to use RPE scale       Expected Outcomes Short Term: Able to use RPE daily in rehab to express subjective intensity level;Long Term:  Able to use RPE to guide  intensity level when exercising independently       Able to understand and use Dyspnea scale Yes       Intervention Provide education and explanation on how to use Dyspnea scale       Expected Outcomes Short Term: Able to use Dyspnea scale daily in rehab to express subjective sense of shortness of breath during exertion;Long Term: Able to use Dyspnea scale to guide intensity level when exercising independently       Knowledge and understanding of Target Heart Rate Range (THRR) Yes       Intervention Provide education and explanation of THRR including how the numbers were predicted and where they are located for reference       Expected Outcomes Short Term: Able to state/look up THRR;Short Term: Able to use daily as guideline for intensity in rehab;Long Term: Able to use THRR to govern intensity when exercising independently       Able to check pulse independently Yes       Intervention Provide education and demonstration on how to check pulse in carotid and radial arteries.;Review the importance of being able to check your own pulse for safety during independent exercise       Expected Outcomes Short Term: Able to explain why pulse checking is important during independent exercise;Long Term: Able to check pulse independently and accurately       Understanding of Exercise Prescription Yes       Intervention Provide education, explanation, and written materials on patient's individual exercise prescription       Expected Outcomes Short Term: Able to explain program exercise prescription;Long Term: Able to explain home exercise prescription to exercise independently              Exercise Goals Re-Evaluation :   Discharge Exercise Prescription (Final Exercise Prescription Changes):  Exercise Prescription Changes - 01/23/20 1600      Response to Exercise   Blood Pressure (Admit) 142/70    Blood Pressure (Exercise) 176/72    Blood Pressure (Exit) 146/68    Heart Rate (Admit) 70 bpm    Heart  Rate (Exercise) 110 bpm    Heart Rate (Exit) 70 bpm    Oxygen Saturation (Admit) 95 %    Oxygen Saturation (Exercise) 97 %    Oxygen Saturation (Exit) 96 %    Rating of Perceived Exertion (Exercise) 11    Perceived Dyspnea (Exercise) 0    Symptoms none    Comments walk test results    Duration Progress to 30 minutes of  aerobic without signs/symptoms of physical distress      Resistance Training   Weight 3 lb    Reps 10-15           Nutrition:  Target Goals: Understanding of nutrition guidelines, daily intake  of sodium '1500mg'$ , cholesterol '200mg'$ , calories 30% from fat and 7% or less from saturated fats, daily to have 5 or more servings of fruits and vegetables.  Education: Controlling Sodium/Reading Food Labels -Group verbal and written material supporting the discussion of sodium use in heart healthy nutrition. Review and explanation with models, verbal and written materials for utilization of the food label.   Education: General Nutrition Guidelines/Fats and Fiber: -Group instruction provided by verbal, written material, models and posters to present the general guidelines for heart healthy nutrition. Gives an explanation and review of dietary fats and fiber.   Biometrics:  Pre Biometrics - 01/23/20 1620      Pre Biometrics   Height 5' 10.5" (1.791 m)    Weight 235 lb 6.4 oz (106.8 kg)    BMI (Calculated) 33.29    Single Leg Stand 4.32 seconds            Nutrition Therapy Plan and Nutrition Goals:   Nutrition Assessments:  Nutrition Assessments - 01/23/20 1633      MEDFICTS Scores   Pre Score 40           MEDIFICTS Score Key:          ?70 Need to make dietary changes          40-70 Heart Healthy Diet         ? 40 Therapeutic Level Cholesterol Diet  Nutrition Goals Re-Evaluation:   Nutrition Goals Discharge (Final Nutrition Goals Re-Evaluation):   Psychosocial: Target Goals: Acknowledge presence or absence of significant depression and/or stress,  maximize coping skills, provide positive support system. Participant is able to verbalize types and ability to use techniques and skills needed for reducing stress and depression.   Education: Depression - Provides group verbal and written instruction on the correlation between heart/lung disease and depressed mood, treatment options, and the stigmas associated with seeking treatment.   Education: Sleep Hygiene -Provides group verbal and written instruction about how sleep can affect your health.  Define sleep hygiene, discuss sleep cycles and impact of sleep habits. Review good sleep hygiene tips.     Education: Stress and Anxiety: - Provides group verbal and written instruction about the health risks of elevated stress and causes of high stress.  Discuss the correlation between heart/lung disease and anxiety and treatment options. Review healthy ways to manage with stress and anxiety.    Initial Review & Psychosocial Screening:  Initial Psych Review & Screening - 01/22/20 1041      Initial Review   Current issues with None Identified      Family Dynamics   Good Support System? Yes    Comments He has alot of family and friends to keep him motivated. He has no stressors at the moment exept getting his health back on track.      Barriers   Psychosocial barriers to participate in program There are no identifiable barriers or psychosocial needs.      Screening Interventions   Interventions Encouraged to exercise;To provide support and resources with identified psychosocial needs;Provide feedback about the scores to participant    Expected Outcomes Short Term goal: Utilizing psychosocial counselor, staff and physician to assist with identification of specific Stressors or current issues interfering with healing process. Setting desired goal for each stressor or current issue identified.;Long Term Goal: Stressors or current issues are controlled or eliminated.;Short Term goal:  Identification and review with participant of any Quality of Life or Depression concerns found by scoring the questionnaire.;Long Term goal:  The participant improves quality of Life and PHQ9 Scores as seen by post scores and/or verbalization of changes           Quality of Life Scores:   Quality of Life - 01/23/20 1635      Quality of Life   Select Quality of Life      Quality of Life Scores   Health/Function Pre 23.93 %    Socioeconomic Pre 20.06 %    Psych/Spiritual Pre 23.14 %    Family Pre 24.9 %    GLOBAL Pre 23.02 %          Scores of 19 and below usually indicate a poorer quality of life in these areas.  A difference of  2-3 points is a clinically meaningful difference.  A difference of 2-3 points in the total score of the Quality of Life Index has been associated with significant improvement in overall quality of life, self-image, physical symptoms, and general health in studies assessing change in quality of life.  PHQ-9: Recent Review Flowsheet Data    Depression screen [facility] 2/9 01/23/2020 01/21/2020 11/18/2019 08/28/2018 04/23/2018   Decreased Interest 0 0 0 0 0   Down, Depressed, Hopeless 0 0 0 0 0   PHQ - 2 Score 0 0 0 0 0   Altered sleeping 0 - - - -   Tired, decreased energy 1 - - - -   Change in appetite 0 - - - -   Feeling bad or failure about yourself  0 - - - -   Trouble concentrating 0 - - - -   Moving slowly or fidgety/restless 0 - - - -   Suicidal thoughts 0 - - - -   PHQ-9 Score 1 - - - -   Difficult doing work/chores Not difficult at all - - - -     Interpretation of Total Score  Total Score Depression Severity:  1-4 = Minimal depression, 5-9 = Mild depression, 10-14 = Moderate depression, 15-19 = Moderately severe depression, 20-27 = Severe depression   Psychosocial Evaluation and Intervention:  Psychosocial Evaluation - 01/22/20 1047      Psychosocial Evaluation & Interventions   Interventions Encouraged to exercise with the program and follow  exercise prescription;Relaxation education;Stress management education    Comments He has alot of family and friends to keep him motivated. He has no stressors at the moment exept getting his health back on track.    Expected Outcomes Short: Exercise regularly to support mental health and notify staff of any changes. Long: maintain mental health and well being through teaching of rehab or prescribed medications independently.    Continue Psychosocial Services  Follow up required by staff           Psychosocial Re-Evaluation:   Psychosocial Discharge (Final Psychosocial Re-Evaluation):   Vocational Rehabilitation: Provide vocational rehab assistance to qualifying candidates.   Vocational Rehab Evaluation & Intervention:   Education: Education Goals: Education classes will be provided on a variety of topics geared toward better understanding of heart health and risk factor modification. Participant will state understanding/return demonstration of topics presented as noted by education test scores.  Learning Barriers/Preferences:  Learning Barriers/Preferences - 01/22/20 1040      Learning Barriers/Preferences   Learning Barriers None    Learning Preferences None           General Cardiac Education Topics:  AED/CPR: - Group verbal and written instruction with the use of models to demonstrate the basic use of  the AED with the basic ABC's of resuscitation.   Anatomy & Physiology of the Heart: - Group verbal and written instruction and models provide basic cardiac anatomy and physiology, with the coronary electrical and arterial systems. Review of Valvular disease and Heart Failure   Cardiac Procedures: - Group verbal and written instruction to review commonly prescribed medications for heart disease. Reviews the medication, class of the drug, and side effects. Includes the steps to properly store meds and maintain the prescription regimen. (beta blockers and  nitrates)   Cardiac Medications I: - Group verbal and written instruction to review commonly prescribed medications for heart disease. Reviews the medication, class of the drug, and side effects. Includes the steps to properly store meds and maintain the prescription regimen.   Cardiac Medications II: -Group verbal and written instruction to review commonly prescribed medications for heart disease. Reviews the medication, class of the drug, and side effects. (all other drug classes)    Go Sex-Intimacy & Heart Disease, Get SMART - Goal Setting: - Group verbal and written instruction through game format to discuss heart disease and the return to sexual intimacy. Provides group verbal and written material to discuss and apply goal setting through the application of the S.M.A.R.T. Method.   Other Matters of the Heart: - Provides group verbal, written materials and models to describe Stable Angina and Peripheral Artery. Includes description of the disease process and treatment options available to the cardiac patient.   Infection Prevention: - Provides verbal and written material to individual with discussion of infection control including proper hand washing and proper equipment cleaning during exercise session.   Cardiac Rehab from 01/22/2020 in [facility] Cardiac and Pulmonary Rehab  Date 01/22/20  Educator [facility]  Instruction Review Code 1- Verbalizes Understanding      Falls Prevention: - Provides verbal and written material to individual with discussion of falls prevention and safety.   Cardiac Rehab from 01/22/2020 in [facility] Cardiac and Pulmonary Rehab  Date 01/22/20  Educator [facility]  Instruction Review Code 1- Verbalizes Understanding      Other: -Provides group and verbal instruction on various topics (see comments)   Knowledge Questionnaire Score:  Knowledge Questionnaire Score - 01/23/20 1633      Knowledge Questionnaire Score   Pre Score 22/26: PAD, Nutrition, Exercise            Core Components/Risk Factors/Patient Goals at Admission:  Personal Goals and Risk Factors at Admission - 01/23/20 1623      Core Components/Risk Factors/Patient Goals on Admission    Weight Management Yes;Weight Loss    Intervention Weight Management: Develop a combined nutrition and exercise program designed to reach desired caloric intake, while maintaining appropriate intake of nutrient and fiber, sodium and fats, and appropriate energy expenditure required for the weight goal.;Weight Management/Obesity: Establish reasonable short term and long term weight goals.;Weight Management: Provide education and appropriate resources to help participant work on and attain dietary goals.    Admit Weight 235 lb (106.6 kg)    Goal Weight: Short Term 230 lb (104.3 kg)    Goal Weight: Long Term 225 lb (102.1 kg)    Expected Outcomes Short Term: Continue to assess and modify interventions until short term weight is achieved;Long Term: Adherence to nutrition and physical activity/exercise program aimed toward attainment of established weight goal;Weight Loss: Understanding of general recommendations for a balanced deficit meal plan, which promotes 1-2 lb weight loss per week and includes a negative energy balance of [phone] kcal/d;Understanding recommendations for meals to  include 15-35% energy as protein, 25-35% energy from fat, 35-60% energy from carbohydrates, less than $RemoveB'200mg'AuODbnGE$  of dietary cholesterol, 20-35 gm of total fiber daily;Understanding of distribution of calorie intake throughout the day with the consumption of 4-5 meals/snacks    Tobacco Cessation Yes    Number of packs per day Chewing- everyday    Intervention Assist the participant in steps to quit. Provide individualized education and counseling about committing to Tobacco Cessation, relapse prevention, and pharmacological support that can be provided by physician.;Advice worker, assist with locating and accessing local/national  Quit Smoking programs, and support quit date choice.    Expected Outcomes Short Term: Will demonstrate readiness to quit, by selecting a quit date.;Short Term: Will quit all tobacco product use, adhering to prevention of relapse plan.;Long Term: Complete abstinence from all tobacco products for at least 12 months from quit date.    Diabetes Yes    Intervention Provide education about signs/symptoms and action to take for hypo/hyperglycemia.;Provide education about proper nutrition, including hydration, and aerobic/resistive exercise prescription along with prescribed medications to achieve blood glucose in normal ranges: Fasting glucose 65-99 mg/dL    Expected Outcomes Short Term: Participant verbalizes understanding of the signs/symptoms and immediate care of hyper/hypoglycemia, proper foot care and importance of medication, aerobic/resistive exercise and nutrition plan for blood glucose control.;Long Term: Attainment of HbA1C < 7%.    Hypertension Yes    Intervention Provide education on lifestyle modifcations including regular physical activity/exercise, weight management, moderate sodium restriction and increased consumption of fresh fruit, vegetables, and low fat dairy, alcohol moderation, and smoking cessation.;Monitor prescription use compliance.    Expected Outcomes Short Term: Continued assessment and intervention until BP is < 140/6mm HG in hypertensive participants. < 130/93mm HG in hypertensive participants with diabetes, heart failure or chronic kidney disease.;Long Term: Maintenance of blood pressure at goal levels.    Lipids Yes    Intervention Provide education and support for participant on nutrition & aerobic/resistive exercise along with prescribed medications to achieve LDL '70mg'$ , HDL >$Remo'40mg'tkzUf$ .    Expected Outcomes Short Term: Participant states understanding of desired cholesterol values and is compliant with medications prescribed. Participant is following exercise prescription and  nutrition guidelines.;Long Term: Cholesterol controlled with medications as prescribed, with individualized exercise RX and with personalized nutrition plan. Value goals: LDL < $Rem'70mg'nDpb$ , HDL > 40 mg.           Education:Diabetes - Individual verbal and written instruction to review signs/symptoms of diabetes, desired ranges of glucose level fasting, after meals and with exercise. Acknowledge that pre and post exercise glucose checks will be done for 3 sessions at entry of program.   Cardiac Rehab from 01/22/2020 in [facility]  Date 01/22/20  Educator [facility]  Instruction Review Code 1- Verbalizes Understanding      Education: Know Your Numbers and Risk Factors: -Group verbal and written instruction about important numbers in your health.  Discussion of what are risk factors and how they play a role in the disease process.  Review of Cholesterol, Blood Pressure, Diabetes, and BMI and the role they play in your overall health.   Core Components/Risk Factors/Patient Goals Review:    Core Components/Risk Factors/Patient Goals at Discharge (Final Review):    ITP Comments:  ITP Comments    Row Name 01/22/20 1046 01/23/20 1613 01/23/20 1704 01/29/20 0654     ITP Comments Virtual Visit completed. Patient informed on EP and RD appointment and 6 Minute walk test. Patient also informed of patient  health questionnaires on My Chart. Patient Verbalizes understanding. Visit diagnosis [facility] in [facility] 12[facility]. Completed 6MWT and gym orientation. Initial ITP created and sent for review to Dr. Emily Filbert, Medical Director. Yvone Neu is a current tobacco user. Intervention for tobacco cessation was provided at the initial medical review. He was asked about readiness to quit and reported he has thought about it but is not ready to quit. Patient currently chews tobacco. Patient was advised and educated about tobacco cessation using combination therapy, tobacco cessation classes, quit line, and  quit smoking apps. Patient demonstrated understanding of this material. Staff will continue to provide encouragement and follow up with the patient throughout the program. 30 Day review completed. Medical Director ITP review done, changes made as directed, and signed approval by Medical Director.           Comments:

## 2020-01-30 ENCOUNTER — Ambulatory Visit: Payer: Medicare HMO

## 2020-01-30 ENCOUNTER — Other Ambulatory Visit: Payer: Self-pay | Admitting: Family Medicine

## 2020-02-04 ENCOUNTER — Ambulatory Visit: Payer: Medicare HMO

## 2020-02-05 DIAGNOSIS — N183 Chronic kidney disease, stage 3 unspecified: Secondary | ICD-10-CM

## 2020-02-05 DIAGNOSIS — I129 Hypertensive chronic kidney disease with stage 1 through stage 4 chronic kidney disease, or unspecified chronic kidney disease: Secondary | ICD-10-CM

## 2020-02-05 MED ORDER — AMLODIPINE BESYLATE 2.5 MG PO TABS: 2.5000 mg | ORAL_TABLET | Freq: Every day | ORAL | 3 refills | Status: DC

## 2020-02-06 ENCOUNTER — Ambulatory Visit: Payer: Medicare HMO

## 2020-02-06 ENCOUNTER — Encounter: Payer: Self-pay | Admitting: *Deleted

## 2020-02-08 ENCOUNTER — Other Ambulatory Visit: Payer: Self-pay | Admitting: Family Medicine

## 2020-02-08 DIAGNOSIS — J3089 Other allergic rhinitis: Secondary | ICD-10-CM

## 2020-02-08 NOTE — Telephone Encounter (Signed)
Requested Prescriptions  Pending Prescriptions Disp Refills   fluticasone (FLONASE) 50 MCG/ACT nasal spray [Pharmacy Med Name: FLUTICASONE PROPIONATE 50 MCG/ACT Suspension] 48 g 0    Sig: USE 2 SPRAYS IN EACH NOSTRIL ONCE DAILY USE FOR 4 TO 6 WEEKS, THEN STOP AND USE SEASONALLY OR AS NEEDED.     Ear, Nose, and Throat: Nasal Preparations - Corticosteroids Passed - 02/08/2020  5:48 AM      Passed - Valid encounter within last 12 months    Recent Outpatient Visits          1 month ago History of non-ST elevation myocardial infarction (NSTEMI)   [facility] Althea Charon, Netta Neat, DO   2 months ago Annual physical exam   [facility] [name], DO   7 months ago Acute cystitis without hematuria   [facility] [name], DO   1 year ago Cystitis   [facility] [place], [place] M, [place]   1 year ago Acute cystitis with hematuria   [facility] Galen Manila, NP      Future Appointments            In 2 weeks Althea Charon, Netta Neat, DO [facility]   In 1 month Agbor-Etang, Arlys John, MD [facility]   In 3 months Stoioff, Verna Czech, MD [facility]

## 2020-02-11 ENCOUNTER — Encounter: Payer: Self-pay | Admitting: *Deleted

## 2020-02-11 ENCOUNTER — Ambulatory Visit: Payer: Medicare HMO

## 2020-02-11 DIAGNOSIS — I214 Non-ST elevation (NSTEMI) myocardial infarction: Secondary | ICD-10-CM

## 2020-02-11 NOTE — Progress Notes (Signed)
Cardiac Individual Treatment Plan  Patient Details  Name: [name] MRN: [id number] Date of Birth: 1951/01/19 Referring Provider:     Cardiac Rehab from 01/23/2020 in [facility] Cardiac and Pulmonary Rehab  Referring Provider Lorine Bears, MD      Initial Encounter Date:    Cardiac Rehab from 01/23/2020 in [facility] Cardiac and Pulmonary Rehab  Date 01/23/20      Visit Diagnosis: NSTEMI (non-ST elevated myocardial infarction) [facility])  Patient's Home Medications on Admission:  Current Outpatient Medications:  .  ACCU-CHEK SMARTVIEW test strip, USE 1 STRIP TO CHECK GLUCOSE TWICE DAILY, Disp: 100 each, Rfl: 12 .  acetaminophen (TYLENOL) 500 MG tablet, Take 1,000 mg by mouth every 8 (eight) hours as needed. , Disp: , Rfl:  .  allopurinol (ZYLOPRIM) 100 MG tablet, Take 1 tablet (100 mg total) by mouth at bedtime., Disp: 90 tablet, Rfl: 3 .  amLODipine (NORVASC) 2.5 MG tablet, Take 1 tablet (2.5 mg total) by mouth daily., Disp: 90 tablet, Rfl: 3 .  aspirin EC 81 MG EC tablet, Take 1 tablet (81 mg total) by mouth daily. Swallow whole., Disp: 30 tablet, Rfl: 0 .  atorvastatin (LIPITOR) 40 MG tablet, Take 1 tablet (40 mg total) by mouth daily., Disp: 90 tablet, Rfl: 1 .  BYDUREON BCISE 2 MG/0.85ML AUIJ, INJECT 2 MG SUBCUTANEOUSLY ONCE A WEEK (Patient taking differently: Inject 2 mg into the skin once a week. ), Disp: 12 mL, Rfl: 3 .  calcium carbonate (TUMS - DOSED IN MG ELEMENTAL CALCIUM) 500 MG chewable tablet, Chew 2 tablets by mouth at bedtime as needed for indigestion or heartburn. , Disp: , Rfl:  .  carvedilol (COREG) 12.5 MG tablet, Take 1 tablet (12.5 mg total) by mouth 2 (two) times daily., Disp: 180 tablet, Rfl: 3 .  docusate sodium (COLACE) 100 MG capsule, Take 100 mg by mouth 2 (two) times daily.  (Patient not taking: Reported on 01/13/2020), Disp: , Rfl:  .  ezetimibe (ZETIA) 10 MG tablet, Take 1 tablet (10 mg total) by mouth daily., Disp: 90 tablet, Rfl: 1 .  fluticasone (FLONASE)  50 MCG/ACT nasal spray, USE 2 SPRAYS IN EACH NOSTRIL ONCE DAILY USE FOR 4 TO 6 WEEKS, THEN STOP AND USE SEASONALLY OR AS NEEDED., Disp: 48 g, Rfl: 0 .  Insulin Glargine ([name] KWIKPEN) 100 UNIT/ML, Inject 10 Units into the skin at bedtime. (Patient taking differently: Inject 11 Units into the skin at bedtime. ), Disp: , Rfl:  .  Insulin Pen Needle (NOVOFINE PLUS) 32G X 4 MM MISC, Use with [name] insulin daily injection as instructed, Disp: 90 each, Rfl: 3 .  lisinopril (ZESTRIL) 40 MG tablet, Take 1 tablet (40 mg total) by mouth daily., Disp: 90 tablet, Rfl: 3 .  nitroGLYCERIN (NITROSTAT) 0.4 MG SL tablet, Place 1 tablet (0.4 mg total) under the tongue every 5 (five) minutes as needed for chest pain., Disp: 30 tablet, Rfl: 0 .  omeprazole (PRILOSEC) 20 MG capsule, Take 1 capsule (20 mg total) by mouth daily before breakfast., Disp: 90 capsule, Rfl: 3 .  ticagrelor (BRILINTA) 90 MG TABS tablet, Take 1 tablet (90 mg total) by mouth 2 (two) times daily., Disp: 180 tablet, Rfl: 1  Past Medical History: Past Medical History:  Diagnosis Date  . Allergy   . Chronic headaches   . GERD (gastroesophageal reflux disease)   . Heart burn   . Hyperlipidemia   . Hypertension     Tobacco Use: Social History   Tobacco Use  Smoking Status Former Smoker  . Types: Cigars  . Quit date: 2000  . Years since quitting: 21.8  Smokeless Tobacco Current User  . Types: Chew  Tobacco Comment   wife states patient never smoked cigarettes, just chewing tobacco    Labs: Recent Review Flowsheet Data    Labs for ITP Cardiac and Pulmonary Rehab Latest Ref Rng & Units 11/29/2018 01/11/2019 11/12/2019 12/27/2019 12/29/2019   Cholestrol 0 - 200 mg/dL 638(L) - 373(S) - 287   LDLCALC 0 - 99 mg/dL 681(L) - 572(I) - 81   HDL >40 mg/dL 20(B) - 55(H) - 74(B)   Trlycerides <150 mg/dL 638(G) - 536(I) - 680(H)   Hemoglobin A1c 4.8 - 5.6 % 7.4(H) 7.8 8.9(H) 8.7(H) -       Exercise Target Goals: Exercise Program  Goal: Individual exercise prescription set using results from initial 6 min walk test and THRR while considering  patient's activity barriers and safety.   Exercise Prescription Goal: Initial exercise prescription builds to 30-45 minutes a day of aerobic activity, 2-3 days per week.  Home exercise guidelines will be given to patient during program as part of exercise prescription that the participant will acknowledge.   Education: Aerobic Exercise: - Group verbal and visual presentation on the components of exercise prescription. Introduces F.I.T.T principle from ACSM for exercise prescriptions.  Reviews F.I.T.T. principles of aerobic exercise including progression. Written material given at graduation.   Education: Resistance Exercise: - Group verbal and visual presentation on the components of exercise prescription. Introduces F.I.T.T principle from ACSM for exercise prescriptions  Reviews F.I.T.T. principles of resistance exercise including progression. Written material given at graduation.    Education: Exercise & Equipment Safety: - Individual verbal instruction and demonstration of equipment use and safety with use of the equipment.   Cardiac Rehab from 01/22/2020 in [facility] Cardiac and Pulmonary Rehab  Date 01/22/20  Educator [facility]  Instruction Review Code 1- Verbalizes Understanding      Education: Exercise Physiology & General Exercise Guidelines: - Group verbal and written instruction with models to review the exercise physiology of the cardiovascular system and associated critical values. Provides general exercise guidelines with specific guidelines to those with heart or lung disease.    Education: Flexibility, Balance, Mind/Body Relaxation: - Group verbal and visual presentation with interactive activity on the components of exercise prescription. Introduces F.I.T.T principle from ACSM for exercise prescriptions. Reviews F.I.T.T. principles of flexibility and balance exercise  training including progression. Also discusses the mind body connection.  Reviews various relaxation techniques to help reduce and manage stress (i.e. Deep breathing, progressive muscle relaxation, and visualization). Balance handout provided to take home. Written material given at graduation.   Activity Barriers & Risk Stratification:  Activity Barriers & Cardiac Risk Stratification - 01/23/20 1635      Activity Barriers & Cardiac Risk Stratification   Activity Barriers Back Problems    Cardiac Risk Stratification High           6 Minute Walk:  6 Minute Walk    Row Name 01/23/20 1618         6 Minute Walk   Phase Initial     Distance 1095 feet     Walk Time 6 minutes     # of Rest Breaks 0     MPH 2.07     METS 2.79     RPE 11     Perceived Dyspnea  0     VO2 Peak 9.79     Symptoms No  Resting HR 70 bpm     Resting BP 142/70     Resting Oxygen Saturation  95 %     Exercise Oxygen Saturation  during 6 min walk 97 %     Max Ex. HR 110 bpm     Max Ex. BP 176/72     2 Minute Post BP 146/68            Oxygen Initial Assessment:   Oxygen Re-Evaluation:   Oxygen Discharge (Final Oxygen Re-Evaluation):   Initial Exercise Prescription:  Initial Exercise Prescription - 01/23/20 1600      Date of Initial Exercise RX and Referring Provider   Date 01/23/20    Referring Provider Lorine Bears, MD      Treadmill   MPH 1.8    Grade 0    Minutes 15    METs 2.38      NuStep   Level 2    SPM 80    Minutes 15    METs 2.7      Biostep-RELP   Level 2    SPM 80    Minutes 15    METs 2.7      Prescription Details   Frequency (times per week) 2    Duration Progress to 30 minutes of continuous aerobic without signs/symptoms of physical distress      Intensity   THRR 40-80% of Max Heartrate 102-135    Ratings of Perceived Exertion 11-13    Perceived Dyspnea 0-4      Progression   Progression Continue to progress workloads to maintain intensity without  signs/symptoms of physical distress.      Resistance Training   Training Prescription Yes           Perform Capillary Blood Glucose checks as needed.  Exercise Prescription Changes:  Exercise Prescription Changes    Row Name 01/23/20 1600             Response to Exercise   Blood Pressure (Admit) 142/70       Blood Pressure (Exercise) 176/72       Blood Pressure (Exit) 146/68       Heart Rate (Admit) 70 bpm       Heart Rate (Exercise) 110 bpm       Heart Rate (Exit) 70 bpm       Oxygen Saturation (Admit) 95 %       Oxygen Saturation (Exercise) 97 %       Oxygen Saturation (Exit) 96 %       Rating of Perceived Exertion (Exercise) 11       Perceived Dyspnea (Exercise) 0       Symptoms none       Comments walk test results       Duration Progress to 30 minutes of  aerobic without signs/symptoms of physical distress         Resistance Training   Weight 3 lb       Reps 10-15              Exercise Comments:   Exercise Goals and Review:  Exercise Goals    Row Name 01/23/20 1622             Exercise Goals   Increase Physical Activity Yes       Intervention Provide advice, education, support and counseling about physical activity/exercise needs.;Develop an individualized exercise prescription for aerobic and resistive training based on initial evaluation findings, risk stratification, comorbidities and participant's personal goals.  Expected Outcomes Short Term: Attend rehab on a regular basis to increase amount of physical activity.;Long Term: Add in home exercise to make exercise part of routine and to increase amount of physical activity.;Long Term: Exercising regularly at least 3-5 days a week.       Increase Strength and Stamina Yes       Intervention Provide advice, education, support and counseling about physical activity/exercise needs.;Develop an individualized exercise prescription for aerobic and resistive training based on initial evaluation findings,  risk stratification, comorbidities and participant's personal goals.       Expected Outcomes Short Term: Increase workloads from initial exercise prescription for resistance, speed, and METs.;Short Term: Perform resistance training exercises routinely during rehab and add in resistance training at home;Long Term: Improve cardiorespiratory fitness, muscular endurance and strength as measured by increased METs and functional capacity ( )       Able to understand and use rate of perceived exertion (RPE) scale Yes       Intervention Provide education and explanation on how to use RPE scale       Expected Outcomes Short Term: Able to use RPE daily in rehab to express subjective intensity level;Long Term:  Able to use RPE to guide intensity level when exercising independently       Able to understand and use Dyspnea scale Yes       Intervention Provide education and explanation on how to use Dyspnea scale       Expected Outcomes Short Term: Able to use Dyspnea scale daily in rehab to express subjective sense of shortness of breath during exertion;Long Term: Able to use Dyspnea scale to guide intensity level when exercising independently       Knowledge and understanding of Target Heart Rate Range (THRR) Yes       Intervention Provide education and explanation of THRR including how the numbers were predicted and where they are located for reference       Expected Outcomes Short Term: Able to state/look up THRR;Short Term: Able to use daily as guideline for intensity in rehab;Long Term: Able to use THRR to govern intensity when exercising independently       Able to check pulse independently Yes       Intervention Provide education and demonstration on how to check pulse in carotid and radial arteries.;Review the importance of being able to check your own pulse for safety during independent exercise       Expected Outcomes Short Term: Able to explain why pulse checking is important during independent  exercise;Long Term: Able to check pulse independently and accurately       Understanding of Exercise Prescription Yes       Intervention Provide education, explanation, and written materials on patient's individual exercise prescription       Expected Outcomes Short Term: Able to explain program exercise prescription;Long Term: Able to explain home exercise prescription to exercise independently              Exercise Goals Re-Evaluation :  Exercise Goals Re-Evaluation    Row Name 02/06/20 1610             Exercise Goal Re-Evaluation   Comments Pt has yet to start exercise session due to COVID-19              Discharge Exercise Prescription (Final Exercise Prescription Changes):  Exercise Prescription Changes - 01/23/20 1600      Response to Exercise   Blood Pressure (Admit) 142/70  Blood Pressure (Exercise) 176/72    Blood Pressure (Exit) 146/68    Heart Rate (Admit) 70 bpm    Heart Rate (Exercise) 110 bpm    Heart Rate (Exit) 70 bpm    Oxygen Saturation (Admit) 95 %    Oxygen Saturation (Exercise) 97 %    Oxygen Saturation (Exit) 96 %    Rating of Perceived Exertion (Exercise) 11    Perceived Dyspnea (Exercise) 0    Symptoms none    Comments walk test results    Duration Progress to 30 minutes of  aerobic without signs/symptoms of physical distress      Resistance Training   Weight 3 lb    Reps 10-15           Nutrition:  Target Goals: Understanding of nutrition guidelines, daily intake of sodium 1500mg , cholesterol 200mg , calories 30% from fat and 7% or less from saturated fats, daily to have 5 or more servings of fruits and vegetables.  Education: All About Nutrition: -Group instruction provided by verbal, written material, interactive activities, discussions, models, and posters to present general guidelines for heart healthy nutrition including fat, fiber, MyPlate, the role of sodium in heart healthy nutrition, utilization of the nutrition label, and  utilization of this knowledge for meal planning. Follow up email sent as well. Written material given at graduation.   Biometrics:  Pre Biometrics - 01/23/20 1620      Pre Biometrics   Height 5' 10.5" (1.791 m)    Weight 235 lb 6.4 oz (106.8 kg)    BMI (Calculated) 33.29    Single Leg Stand 4.32 seconds            Nutrition Therapy Plan and Nutrition Goals:   Nutrition Assessments:  Nutrition Assessments - 01/23/20 1633      MEDFICTS Scores   Pre Score 40          MEDIFICTS Score Key:  ?70 Need to make dietary changes   40-70 Heart Healthy Diet  ? 40 Therapeutic Level Cholesterol Diet   Picture Your Plate Scores:  <16 Unhealthy dietary pattern with much room for improvement.  41-50 Dietary pattern unlikely to meet recommendations for good health and room for improvement.  51-60 More healthful dietary pattern, with some room for improvement.   >60 Healthy dietary pattern, although there may be some specific behaviors that could be improved.    Nutrition Goals Re-Evaluation:   Nutrition Goals Discharge (Final Nutrition Goals Re-Evaluation):   Psychosocial: Target Goals: Acknowledge presence or absence of significant depression and/or stress, maximize coping skills, provide positive support system. Participant is able to verbalize types and ability to use techniques and skills needed for reducing stress and depression.   Education: Stress, Anxiety, and Depression - Group verbal and visual presentation to define topics covered.  Reviews how body is impacted by stress, anxiety, and depression.  Also discusses healthy ways to reduce stress and to treat/manage anxiety and depression.  Written material given at graduation.   Education: Sleep Hygiene -Provides group verbal and written instruction about how sleep can affect your health.  Define sleep hygiene, discuss sleep cycles and impact of sleep habits. Review good sleep hygiene tips.    Initial Review &  Psychosocial Screening:  Initial Psych Review & Screening - 01/22/20 1041      Initial Review   Current issues with None Identified      Family Dynamics   Good Support System? Yes    Comments He has alot of family and  friends to keep him motivated. He has no stressors at the moment exept getting his health back on track.      Barriers   Psychosocial barriers to participate in program There are no identifiable barriers or psychosocial needs.      Screening Interventions   Interventions Encouraged to exercise;To provide support and resources with identified psychosocial needs;Provide feedback about the scores to participant    Expected Outcomes Short Term goal: Utilizing psychosocial counselor, staff and physician to assist with identification of specific Stressors or current issues interfering with healing process. Setting desired goal for each stressor or current issue identified.;Long Term Goal: Stressors or current issues are controlled or eliminated.;Short Term goal: Identification and review with participant of any Quality of Life or Depression concerns found by scoring the questionnaire.;Long Term goal: The participant improves quality of Life and PHQ9 Scores as seen by post scores and/or verbalization of changes           Quality of Life Scores:   Quality of Life - 01/23/20 1635      Quality of Life   Select Quality of Life      Quality of Life Scores   Health/Function Pre 23.93 %    Socioeconomic Pre 20.06 %    Psych/Spiritual Pre 23.14 %    Family Pre 24.9 %    GLOBAL Pre 23.02 %          Scores of 19 and below usually indicate a poorer quality of life in these areas.  A difference of  2-3 points is a clinically meaningful difference.  A difference of 2-3 points in the total score of the Quality of Life Index has been associated with significant improvement in overall quality of life, self-image, physical symptoms, and general health in studies assessing change in quality  of life.  PHQ-9: Recent Review Flowsheet Data    Depression screen [facility] 2/9 01/23/2020 01/21/2020 11/18/2019 08/28/2018 04/23/2018   Decreased Interest 0 0 0 0 0   Down, Depressed, Hopeless 0 0 0 0 0   PHQ - 2 Score 0 0 0 0 0   Altered sleeping 0 - - - -   Tired, decreased energy 1 - - - -   Change in appetite 0 - - - -   Feeling bad or failure about yourself  0 - - - -   Trouble concentrating 0 - - - -   Moving slowly or fidgety/restless 0 - - - -   Suicidal thoughts 0 - - - -   PHQ-9 Score 1 - - - -   Difficult doing work/chores Not difficult at all - - - -     Interpretation of Total Score  Total Score Depression Severity:  1-4 = Minimal depression, 5-9 = Mild depression, 10-14 = Moderate depression, 15-19 = Moderately severe depression, 20-27 = Severe depression   Psychosocial Evaluation and Intervention:  Psychosocial Evaluation - 01/22/20 1047      Psychosocial Evaluation & Interventions   Interventions Encouraged to exercise with the program and follow exercise prescription;Relaxation education;Stress management education    Comments He has alot of family and friends to keep him motivated. He has no stressors at the moment exept getting his health back on track.    Expected Outcomes Short: Exercise regularly to support mental health and notify staff of any changes. Long: maintain mental health and well being through teaching of rehab or prescribed medications independently.    Continue Psychosocial Services  Follow up required by  staff           Psychosocial Re-Evaluation:   Psychosocial Discharge (Final Psychosocial Re-Evaluation):   Vocational Rehabilitation: Provide vocational rehab assistance to qualifying candidates.   Vocational Rehab Evaluation & Intervention:   Education: Education Goals: Education classes will be provided on a variety of topics geared toward better understanding of heart health and risk factor modification. Participant will state  understanding/return demonstration of topics presented as noted by education test scores.  Learning Barriers/Preferences:  Learning Barriers/Preferences - 01/22/20 1040      Learning Barriers/Preferences   Learning Barriers None    Learning Preferences None           General Cardiac Education Topics:  AED/CPR: - Group verbal and written instruction with the use of models to demonstrate the basic use of the AED with the basic ABC's of resuscitation.   Anatomy and Cardiac Procedures: - Group verbal and visual presentation and models provide information about basic cardiac anatomy and function. Reviews the testing methods done to diagnose heart disease and the outcomes of the test results. Describes the treatment choices: Medical Management, Angioplasty, or Coronary Bypass Surgery for treating various heart conditions including Myocardial Infarction, Angina, Valve Disease, and Cardiac Arrhythmias.  Written material given at graduation.   Medication Safety: - Group verbal and visual instruction to review commonly prescribed medications for heart and lung disease. Reviews the medication, class of the drug, and side effects. Includes the steps to properly store meds and maintain the prescription regimen.  Written material given at graduation.   Intimacy: - Group verbal instruction through game format to discuss how heart and lung disease can affect sexual intimacy. Written material given at graduation..   Know Your Numbers and Heart Failure: - Group verbal and visual instruction to discuss disease risk factors for cardiac and pulmonary disease and treatment options.  Reviews associated critical values for Overweight/Obesity, Hypertension, Cholesterol, and Diabetes.  Discusses basics of heart failure: signs/symptoms and treatments.  Introduces Heart Failure Zone chart for action plan for heart failure.  Written material given at graduation.   Infection Prevention: - Provides verbal and  written material to individual with discussion of infection control including proper hand washing and proper equipment cleaning during exercise session.   Cardiac Rehab from 01/22/2020 in [facility] Cardiac and Pulmonary Rehab  Date 01/22/20  Educator [facility]  Instruction Review Code 1- Verbalizes Understanding      Falls Prevention: - Provides verbal and written material to individual with discussion of falls prevention and safety.   Cardiac Rehab from 01/22/2020 in [facility] Cardiac and Pulmonary Rehab  Date 01/22/20  Educator [facility]  Instruction Review Code 1- Verbalizes Understanding      Other: -Provides group and verbal instruction on various topics (see comments)   Knowledge Questionnaire Score:  Knowledge Questionnaire Score - 01/23/20 1633      Knowledge Questionnaire Score   Pre Score 22/26: PAD, Nutrition, Exercise           Core Components/Risk Factors/Patient Goals at Admission:  Personal Goals and Risk Factors at Admission - 01/23/20 1623      Core Components/Risk Factors/Patient Goals on Admission    Weight Management Yes;Weight Loss    Intervention Weight Management: Develop a combined nutrition and exercise program designed to reach desired caloric intake, while maintaining appropriate intake of nutrient and fiber, sodium and fats, and appropriate energy expenditure required for the weight goal.;Weight Management/Obesity: Establish reasonable short term and long term weight goals.;Weight Management: Provide education and appropriate resources  to help participant work on and attain dietary goals.    Admit Weight 235 lb (106.6 kg)    Goal Weight: Short Term 230 lb (104.3 kg)    Goal Weight: Long Term 225 lb (102.1 kg)    Expected Outcomes Short Term: Continue to assess and modify interventions until short term weight is achieved;Long Term: Adherence to nutrition and physical activity/exercise program aimed toward attainment of established weight goal;Weight Loss: Understanding of  general recommendations for a balanced deficit meal plan, which promotes 1-2 lb weight loss per week and includes a negative energy balance of [phone] kcal/d;Understanding recommendations for meals to include 15-35% energy as protein, 25-35% energy from fat, 35-60% energy from carbohydrates, less than 200mg  of dietary cholesterol, 20-35 gm of total fiber daily;Understanding of distribution of calorie intake throughout the day with the consumption of 4-5 meals/snacks    Tobacco Cessation Yes    Number of packs per day Chewing- everyday    Intervention Assist the participant in steps to quit. Provide individualized education and counseling about committing to Tobacco Cessation, relapse prevention, and pharmacological support that can be provided by physician.;Education officer, environmentalffer self-teaching materials, assist with locating and accessing local/national Quit Smoking programs, and support quit date choice.    Expected Outcomes Short Term: Will demonstrate readiness to quit, by selecting a quit date.;Short Term: Will quit all tobacco product use, adhering to prevention of relapse plan.;Long Term: Complete abstinence from all tobacco products for at least 12 months from quit date.    Diabetes Yes    Intervention Provide education about signs/symptoms and action to take for hypo/hyperglycemia.;Provide education about proper nutrition, including hydration, and aerobic/resistive exercise prescription along with prescribed medications to achieve blood glucose in normal ranges: Fasting glucose 65-99 mg/dL    Expected Outcomes Short Term: Participant verbalizes understanding of the signs/symptoms and immediate care of hyper/hypoglycemia, proper foot care and importance of medication, aerobic/resistive exercise and nutrition plan for blood glucose control.;Long Term: Attainment of HbA1C < 7%.    Hypertension Yes    Intervention Provide education on lifestyle modifcations including regular physical activity/exercise, weight  management, moderate sodium restriction and increased consumption of fresh fruit, vegetables, and low fat dairy, alcohol moderation, and smoking cessation.;Monitor prescription use compliance.    Expected Outcomes Short Term: Continued assessment and intervention until BP is < 140/3590mm HG in hypertensive participants. < 130/6680mm HG in hypertensive participants with diabetes, heart failure or chronic kidney disease.;Long Term: Maintenance of blood pressure at goal levels.    Lipids Yes    Intervention Provide education and support for participant on nutrition & aerobic/resistive exercise along with prescribed medications to achieve LDL 70mg , HDL >40mg .    Expected Outcomes Short Term: Participant states understanding of desired cholesterol values and is compliant with medications prescribed. Participant is following exercise prescription and nutrition guidelines.;Long Term: Cholesterol controlled with medications as prescribed, with individualized exercise RX and with personalized nutrition plan. Value goals: LDL < 70mg , HDL > 40 mg.           Education:Diabetes - Individual verbal and written instruction to review signs/symptoms of diabetes, desired ranges of glucose level fasting, after meals and with exercise. Acknowledge that pre and post exercise glucose checks will be done for 3 sessions at entry of program.   [facility]  Date 01/22/20  [facility]  Instruction Review Code 1- Verbalizes Understanding      Core Components/Risk Factors/Patient Goals Review:    Core Components/Risk Factors/Patient Goals  at Discharge (Final Review):    ITP Comments:  ITP Comments    Row Name 01/22/20 1046 01/23/20 1613 01/23/20 1704 01/29/20 0654 01/29/20 0827   ITP Comments Virtual Visit completed. Patient informed on EP and RD appointment and 6 Minute walk test. Patient also informed of patient health questionnaires on My Chart. Patient Verbalizes  understanding. Visit diagnosis can be found in [facility] 12/27/2019. Completed and gym orientation. Initial ITP created and sent for review to Dr. Bethann Punches, Medical Director. Rocky Link is a current tobacco user. Intervention for tobacco cessation was provided at the initial medical review. He was asked about readiness to quit and reported he has thought about it but is not ready to quit. Patient currently chews tobacco. Patient was advised and educated about tobacco cessation using combination therapy, tobacco cessation classes, quit line, and quit smoking apps. Patient demonstrated understanding of this material. Staff will continue to provide encouragement and follow up with the patient throughout the program. 30 Day review completed. Medical Director ITP review done, changes made as directed, and signed approval by Medical Director. Kenny's wife called to let us know that he tested positive for COVID-19 on 01/24/20.  We asked for them to stay out for 21 days from positive test date.  He can return on 02/13/20.   Row Name 02/11/20 0914           ITP Comments Kenny's wife has called to say that Bernette Redbird will not be attending the program. Will Discharge him today              Comments: DISCHARGE ITP

## 2020-02-11 NOTE — Progress Notes (Signed)
Discharge Progress Report  Patient Details  Name: [name] MRN: [id number] Date of Birth: Jan 14, 1951 Referring Provider:     Cardiac Rehab from 01/23/2020 in [facility]iac [facility]monary [facility]  Referring Provider Lorine Bears, MD       Number of Visits: 1/36  Reason for Discharge:  Early Exit:  Personal  Smoking History:  Social History   Tobacco Use  Smoking Status Former Smoker  . Types: Cigars  . Quit date: 2000  . Years since quitting: 21.8  Smokeless Tobacco Current User  . Types: Chew  Tobacco Comment   wife states patient never smoked cigarettes, just chewing tobacco    Diagnosis:  NSTEMI (non-ST elevated myocardial infarction) (HCC)  ADL UCSD:   Initial Exercise Prescription:  Initial Exercise Prescription - 01/23/20 1600      Date of Initial Exercise RX and Referring Provider   Date 01/23/20    Referring Provider Lorine Bears, MD      Treadmill   MPH 1.8    Grade 0    Minutes 15    METs 2.38      NuStep   Level 2    SPM 80    Minutes 15    METs 2.7      Biostep-RELP   Level 2    SPM 80    Minutes 15    METs 2.7      Prescription Details   Frequency (times per week) 2    Duration Progress to 30 minutes of continuous aerobic without signs/symptoms of physical distress      Intensity   THRR 40-80% of Max Heartrate 102-135    Ratings of Perceived Exertion 11-13    Perceived Dyspnea 0-4      Progression   Progression Continue to progress workloads to maintain intensity without signs/symptoms of physical distress.      Resistance Training   Training Prescription Yes           Discharge Exercise Prescription (Final Exercise Prescription Changes):  Exercise Prescription Changes - 01/23/20 1600      Response to Exercise   Blood Pressure (Admit) 142/70    Blood Pressure (Exercise) 176/72    Blood Pressure (Exit) 146/68    Heart Rate (Admit) 70 bpm    Heart Rate (Exercise) 110 bpm    Heart Rate (Exit) 70 bpm    Oxygen  Saturation (Admit) 95 %    Oxygen Saturation (Exercise) 97 %    Oxygen Saturation (Exit) 96 %    Rating of Perceived Exertion (Exercise) 11    Perceived Dyspnea (Exercise) 0    Symptoms none    Comments walk test results    Duration Progress to 30 minutes of  aerobic without signs/symptoms of physical distress      Resistance Training   Weight 3 lb    Reps 10-15           Functional Capacity:  6 Minute Walk    Row Name 01/23/20 1618         6 Minute Walk   Phase Initial     Distance 1095 feet     Walk Time 6 minutes     # of Rest Breaks 0     MPH 2.07     METS 2.79     RPE 11     Perceived Dyspnea  0     VO2 Peak 9.79     Symptoms No     Resting HR 70 bpm  Resting BP 142/70     Resting Oxygen Saturation  95 %     Exercise Oxygen Saturation  during 6 min walk 97 %     Max Ex. HR 110 bpm     Max Ex. BP 176/72     2 Minute Post BP 146/68            Nutrition & Weight - Outcomes:  Pre Biometrics - 01/23/20 1620      Pre Biometrics   Height 5' 10.5" (1.791 m)    Weight 235 lb 6.4 oz (106.8 kg)    BMI (Calculated) 33.29    Single Leg Stand 4.32 seconds          Nutrition: Nutrition Discharge:  Nutrition Assessments - 01/23/20 1633      MEDFICTS Scores   Pre Score 40

## 2020-02-12 ENCOUNTER — Ambulatory Visit: Payer: Medicare HMO | Admitting: Pharmacist

## 2020-02-12 DIAGNOSIS — I129 Hypertensive chronic kidney disease with stage 1 through stage 4 chronic kidney disease, or unspecified chronic kidney disease: Secondary | ICD-10-CM

## 2020-02-12 DIAGNOSIS — Z794 Long term (current) use of insulin: Secondary | ICD-10-CM

## 2020-02-12 DIAGNOSIS — E1169 Type 2 diabetes mellitus with other specified complication: Secondary | ICD-10-CM

## 2020-02-12 DIAGNOSIS — N183 Chronic kidney disease, stage 3 unspecified: Secondary | ICD-10-CM

## 2020-02-12 NOTE — Chronic Care Management (AMB) (Signed)
Chronic Care Management   Follow Up Note   02/12/2020 Name: [name] MRN: [id number] DOB: [birth year]  Referred by: [name], DO Reason for referral : Chronic Care Management (Patient Phone Call)   [name] is a 69 y.o. year old male who is a primary care patient of [name], DO. The CCM team was consulted for assistance with chronic disease management and care coordination needs.    I reached out to [name] and his wife by phone today as scheduled. Patient reports that he is not at home and does not have his medications, blood sugar or blood pressure logs with him.  Review of patient status, including review of consultants reports, relevant laboratory and other test results, and collaboration with appropriate care team members and the patient's provider was performed as part of comprehensive patient evaluation and provision of chronic care management services.    SDOH (Social Determinants of Health) assessments performed: No See Care Plan activities for detailed interventions related to [facility])     Outpatient Encounter Medications as of 02/12/2020  Medication Sig Note  . BYDUREON BCISE 2 MG/0.85ML AUIJ INJECT 2 MG SUBCUTANEOUSLY ONCE A WEEK (Patient taking differently: Inject 2 mg into the skin once a week. ) 01/13/2020: On Thursdays  . Insulin Glargine ([name] KWIKPEN) 100 UNIT/ML Inject 10 Units into the skin at bedtime. (Patient taking differently: Inject 14 Units into the skin at bedtime. )   . ACCU-CHEK SMARTVIEW test strip USE 1 STRIP TO CHECK GLUCOSE TWICE DAILY   . acetaminophen (TYLENOL) 500 MG tablet Take 1,000 mg by mouth every 8 (eight) hours as needed.    Marland Kitchen allopurinol (ZYLOPRIM) 100 MG tablet Take 1 tablet (100 mg total) by mouth at bedtime.   Marland Kitchen amLODipine (NORVASC) 2.5 MG tablet Take 1 tablet (2.5 mg total) by mouth daily.   Marland Kitchen aspirin EC 81 MG EC tablet Take 1 tablet (81 mg total) by mouth daily. Swallow whole.    Marland Kitchen atorvastatin (LIPITOR) 40 MG tablet Take 1 tablet (40 mg total) by mouth daily.   . calcium carbonate (TUMS - DOSED IN MG ELEMENTAL CALCIUM) 500 MG chewable tablet Chew 2 tablets by mouth at bedtime as needed for indigestion or heartburn.    . carvedilol (COREG) 12.5 MG tablet Take 1 tablet (12.5 mg total) by mouth 2 (two) times daily.   Marland Kitchen docusate sodium (COLACE) 100 MG capsule Take 100 mg by mouth 2 (two) times daily.  (Patient not taking: Reported on 01/13/2020)   . ezetimibe (ZETIA) 10 MG tablet Take 1 tablet (10 mg total) by mouth daily.   . fluticasone (FLONASE) 50 MCG/ACT nasal spray USE 2 SPRAYS IN EACH NOSTRIL ONCE DAILY USE FOR 4 TO 6 WEEKS, THEN STOP AND USE SEASONALLY OR AS NEEDED.   Marland Kitchen Insulin Pen Needle (NOVOFINE PLUS) 32G X 4 MM MISC Use with [name] insulin daily injection as instructed   . lisinopril (ZESTRIL) 40 MG tablet Take 1 tablet (40 mg total) by mouth daily.   . nitroGLYCERIN (NITROSTAT) 0.4 MG SL tablet Place 1 tablet (0.4 mg total) under the tongue every 5 (five) minutes as needed for chest pain.   Marland Kitchen omeprazole (PRILOSEC) 20 MG capsule Take 1 capsule (20 mg total) by mouth daily before breakfast.   . ticagrelor (BRILINTA) 90 MG TABS tablet Take 1 tablet (90 mg total) by mouth 2 (two) times daily.    No facility-administered encounter medications on file as of 02/12/2020.  Goals Addressed              This Visit's Progress   .  PharmD - Diabetes Management (pt-stated)        CARE PLAN ENTRY (see longitudinal plan of care for additional care plan information)  Current Barriers:  . Chronic Disease Management support, education, and care coordination needs related to type 2 diabetes mellitus, hypertension, hyperlipidemia, GERD, gout, seasonal allergies . Financial Barriers - patient has [employer] and reports copay for Bydureon & insulin glargine is cost prohibitive at this time . Lack of blood sugar or blood pressure readings for clinical  team  Pharmacist Clinical Goal(s):  Marland Kitchen Over the next 30 days, patient will work with CM Pharmacist and PCP to address needs related to medication assistance and diabetes management  Interventions: . Perform chart review.  o Note patient seen for office visit with [facility] on 10/28 for hospital follow up - BP at visit: 156/81, HR 66 - BP Patient advised to restart amlodipine - Creatinine 1.45 mg/dL; eGFR ~49 (01/23/20) o Patient contacted PCP via [place] on 11/10 to request Rx for amlodipine and Rx sent by PCP to [place] order . Discuss importance of blood sugar monitoring and control.  o Reports taking: - Bydureon 2 mg once weekly - [name] insulin 14 units daily - Note NOT taking metformin as held since hospital discharge o Reports has not been checking CBGs consistently. Recalls only reading from past week: - Yesterday before breakfast: 225  Attributes elevated reading to having cake evening prior o Patient reports has followed instructions for [name] from PCP from 10/12 office visit: "can increase up by 1 to 2 units every week if morning fasting sugar is >150 in the morning most days." o Counsel patient on rational/importance of checking morning blood sugar consistently and keeping log of results, including for following instruction from provider. Remind patient to bring this log with him to upcoming appointment with PCP on 11/29 o Counsel patient on s/s of low blood sugar and how to treat lows o Discuss importance of eating regular and well-balanced meals and of limiting carbohydrate portion sizes . Counsel on importance of blood pressure control and monitoring o Patient unable to provide recent blood pressure readings or confirm what medications he is currently taking (patient not home during phone call) o Encourage patient to check home BP, keep log and bring record with him to upcoming appointment with PCP . Patient requests that I reach out to  spouse regarding information for patient assistance program re-enrollment o Verbally walk patient's wife through how to complete Re-enrollment form for [name] online . Will collaborate with [place] Simcox to request she assist patient with patient assistance re-enrollment for Bydrueon through [name] and [name] through [employer]  Patient Self Care Activities:  . Self administers medications as prescribed with assistance from his wife o Uses weekly pillbox as adherence tool . Attends all scheduled provider appointments o Next appointment with PCP on 11/29 o Next appointment with Cardiology on 12/30 . Calls pharmacy for medication refills . Calls provider office for new concerns or questions . To check blood sugars twice daily and keep log   Please see past updates related to this goal by clicking on the "Past Updates" button in the selected goal        Plan  Telephone follow up appointment with care management team member scheduled for: 12/15 at 4 pm  Harlow Asa, PharmD, [place]  [facility] [employer] [phone]

## 2020-02-12 NOTE — Patient Instructions (Signed)
Thank you allowing the Chronic Care Management Team to be a part of your care! It was a pleasure speaking with you today!     CCM (Chronic Care Management) Team    Noreene Larsson RN, MSN, CCM Nurse Care Coordinator  [phone]   Harlow Asa PharmD  Clinical Pharmacist  [phone]   Eula Fried LCSW Clinical Social Worker [phone]  Visit Information  Goals Addressed              This Visit's Progress     PharmD - Diabetes Management (pt-stated)        CARE PLAN ENTRY (see longitudinal plan of care for additional care plan information)  Current Barriers:   Chronic Disease Management support, education, and care coordination needs related to type 2 diabetes mellitus, hypertension, hyperlipidemia, GERD, gout, seasonal allergies  Financial Barriers - patient has [employer] and reports copay for Bydureon & insulin glargine is cost prohibitive at this time  Lack of blood sugar or blood pressure readings for clinical team  Pharmacist Clinical Goal(s):   Over the next 30 days, patient will work with CM Pharmacist and PCP to address needs related to medication assistance and diabetes management  Interventions:  Perform chart review.  o Note patient seen for office visit with [facility] on 10/28 for hospital follow up - BP at visit: 156/81, HR 66 - BP Patient advised to restart amlodipine - Creatinine 1.45 mg/dL; eGFR ~49 (01/23/20) o Patient contacted PCP via [place] on 11/10 to request Rx for amlodipine and Rx sent by PCP to [place] order  Discuss importance of blood sugar monitoring and control.  o Reports taking: - Bydureon 2 mg once weekly - [name] insulin 14 units daily - Note NOT taking metformin as held since hospital discharge o Reports has not been checking CBGs consistently. Recalls only reading from past week: - Yesterday before breakfast: 225  Attributes elevated reading to having cake evening  prior o Patient reports has followed instructions for [name] from PCP from 10/12 office visit: "can increase up by 1 to 2 units every week if morning fasting sugar is >150 in the morning most days." o Counsel patient on rational/importance of checking morning blood sugar consistently and keeping log of results, including for following instruction from provider. Remind patient to bring this log with him to upcoming appointment with PCP on 11/29 o Counsel patient on s/s of low blood sugar and how to treat lows o Discuss importance of eating regular and well-balanced meals and of limiting carbohydrate portion sizes  Counsel on importance of blood pressure control and monitoring o Patient unable to provide recent blood pressure readings or confirm what medications he is currently taking (patient not home during phone call) o Encourage patient to check home BP, keep log and bring record with him to upcoming appointment with PCP  Patient requests that I reach out to spouse regarding information for patient assistance program re-enrollment o Verbally walk patient's wife through how to complete Re-enrollment form for [name] online  Will collaborate with [place] Simcox to request she assist patient with patient assistance re-enrollment for Bydrueon through [name] and Katie through [employer]  Patient Self Care Activities:   Self administers medications as prescribed with assistance from his wife o Uses weekly pillbox as adherence tool  Attends all scheduled provider appointments o Next appointment with PCP on 11/29 o Next appointment with Cardiology on 12/30  Calls pharmacy for medication refills  Calls provider  office for new concerns or questions  To check blood sugars twice daily and keep log   Please see past updates related to this goal by clicking on the "Past Updates" button in the selected goal        The patient verbalized understanding of instructions, educational materials,  and care plan provided today and declined offer to receive copy of patient instructions, educational materials, and care plan.   Telephone follow up appointment with care management team member scheduled for: 12/15 at 4 pm  Harlow Asa, PharmD, [place] [phone]

## 2020-02-13 ENCOUNTER — Ambulatory Visit: Payer: Medicare HMO

## 2020-02-18 ENCOUNTER — Ambulatory Visit: Payer: Medicare HMO

## 2020-02-24 ENCOUNTER — Encounter: Payer: Self-pay | Admitting: Family Medicine

## 2020-02-24 ENCOUNTER — Other Ambulatory Visit: Payer: Self-pay

## 2020-02-24 ENCOUNTER — Ambulatory Visit (INDEPENDENT_AMBULATORY_CARE_PROVIDER_SITE_OTHER): Payer: Medicare HMO | Admitting: Family Medicine

## 2020-02-24 VITALS — BP 136/62 | HR 72 | Temp 96.9°F | Resp 16 | Ht 71.0 in | Wt 232.0 lb

## 2020-02-24 DIAGNOSIS — E1169 Type 2 diabetes mellitus with other specified complication: Secondary | ICD-10-CM | POA: Diagnosis not present

## 2020-02-24 DIAGNOSIS — M5416 Radiculopathy, lumbar region: Secondary | ICD-10-CM

## 2020-02-24 DIAGNOSIS — I129 Hypertensive chronic kidney disease with stage 1 through stage 4 chronic kidney disease, or unspecified chronic kidney disease: Secondary | ICD-10-CM

## 2020-02-24 DIAGNOSIS — Z794 Long term (current) use of insulin: Secondary | ICD-10-CM | POA: Diagnosis not present

## 2020-02-24 DIAGNOSIS — E1122 Type 2 diabetes mellitus with diabetic chronic kidney disease: Secondary | ICD-10-CM

## 2020-02-24 DIAGNOSIS — N183 Chronic kidney disease, stage 3 unspecified: Secondary | ICD-10-CM | POA: Diagnosis not present

## 2020-02-24 MED ORDER — GABAPENTIN 100 MG PO CAPS: ORAL_CAPSULE | ORAL | 2 refills | Status: DC

## 2020-02-24 NOTE — Progress Notes (Signed)
Subjective:    Patient ID: [name], male    DOB: Oct 27, 1950, 69 y.o.   MRN: [id number]  [name] is a 69 y.o. male presenting on 02/24/2020 for Diabetes   HPI   CHRONIC DM, Type 2with nephropathy / CKD-III Last A1c done in October. He admits increased appetite eating more still in evening, raises his sugar in AM CBGs: Avgapprox 180 - 230s. Checks CBGs1-2 daily Meds: -CONTINUE Bydureon BCise  weekly inj - Continue Lantusback at14u daily (he had increased this) - OFF Metformin XR750 x 2 daily - now has had resolved GI symptoms. Reports good compliance. Tolerating well w/o side-effects Currently on ACEi Lifestyle: - Diet (improved DM Diet, except now still increased sweets) - Exercise limited Due for DM Eye exam. Denies hypoglycemia, polyuria, visual changes, numbness or tingling.  CHRONIC HTN CKD III Current Meds - Lisinopril  daily, Amlodipine 2.5mg  daily   Reports good compliance, took meds today. Tolerating well, w/o complaints. Denies CP, dyspnea, HA, edema, dizziness / lightheadedness  Low Back Pain, Lumbar Radiculopathy He has known Arthritis DJD Has had prior spinal ESI injection and other treatment in past He cannot take OTC NSAIDs. He has tried Tylenol.  Health Maintenance: Declines vaccines.  Depression screen [facility] 2/9 01/23/2020 01/21/2020 11/18/2019  Decreased Interest 0 0 0  Down, Depressed, Hopeless 0 0 0  PHQ - 2 Score 0 0 0  Altered sleeping 0 - -  Tired, decreased energy 1 - -  Change in appetite 0 - -  Feeling bad or failure about yourself  0 - -  Trouble concentrating 0 - -  Moving slowly or fidgety/restless 0 - -  Suicidal thoughts 0 - -  PHQ-9 Score 1 - -  Difficult doing work/chores Not difficult at all - -    Social History   Tobacco Use  . Smoking status: Former Smoker    Types: Cigars    Quit date: 2000    Years since quitting: 21.9  . Smokeless tobacco: Current User    Types: Chew  . Tobacco  comment: wife states patient never smoked cigarettes, just chewing tobacco  Vaping Use  . Vaping Use: Never used  Substance Use Topics  . Alcohol use: Not Currently    Alcohol/week: 0.0 standard drinks  . Drug use: No    Review of Systems Per HPI unless specifically indicated above     Objective:    BP 136/62   Pulse 72   Temp (!) 96.9 F (36.1 C) (Temporal)   Resp 16   Ht  (1.803 m)   Wt 232 lb (105.2 kg)   SpO2 100%   BMI 32.36 kg/m   Wt Readings from Last 3 Encounters:  02/24/20 232 lb (105.2 kg)  01/23/20 235 lb 6.4 oz (106.8 kg)  01/21/20 231 lb (104.8 kg)    Physical Exam Vitals and nursing note reviewed.  Constitutional:      General: He is not in acute distress.    Appearance: He is well-developed. He is not diaphoretic.     Comments: Well-appearing, comfortable, cooperative  HENT:     Head: Normocephalic and atraumatic.  Eyes:     General:        Right eye: No discharge.        Left eye: No discharge.     Conjunctiva/sclera: Conjunctivae normal.  Cardiovascular:     Rate and Rhythm: Normal rate.  Pulmonary:     Effort: Pulmonary effort is normal.  Skin:  General: Skin is warm and dry.     Findings: No erythema or rash.  Neurological:     Mental Status: He is alert and oriented to person, place, and time.  Psychiatric:        Behavior: Behavior normal.     Comments: Well groomed, good eye contact, normal speech and thoughts     Recent Labs    11/12/19 0829 12/27/19 1329  HGBA1C 8.9* 8.7*      Results for orders placed or performed during the hospital encounter of 12/27/19  Respiratory Panel by RT PCR (Flu A&B, Covid) - Nasopharyngeal Swab   Specimen: Nasopharyngeal Swab  Result Value Ref Range   SARS Coronavirus 2 by RT PCR NEGATIVE NEGATIVE   Influenza A by PCR NEGATIVE NEGATIVE   Influenza B by PCR NEGATIVE NEGATIVE  Basic metabolic panel  Result Value Ref Range   Sodium 133 (L) 135 - 145 mmol/L   Potassium 6.9 (HH) 3.5 -  5.1 mmol/L   Chloride 109 98 - 111 mmol/L   CO2 15 (L) 22 - 32 mmol/L   Glucose, Bld 182 (H) 70 - 99 mg/dL   BUN 39 (H) 8 - 23 mg/dL   Creatinine, Ser 4.09 (H) 0.61 - 1.24 mg/dL   Calcium 81.1 8.9 - 91.4 mg/dL   GFR calc non Af Amer 23 (L) >60 mL/min   GFR calc Af Amer 27 (L) >60 mL/min   Anion gap 9 5 - 15  CBC  Result Value Ref Range   WBC 7.0 4.0 - 10.5 K/uL   RBC 4.75 4.22 - 5.81 MIL/uL   Hemoglobin 13.8 13.0 - 17.0 g/dL   HCT 78.2 39 - 52 %   MCV 90.5 80.0 - 100.0 fL   MCH 29.1 26.0 - 34.0 pg   MCHC 32.1 30.0 - 36.0 g/dL   RDW 95.6 21.3 - 08.6 %   Platelets 430 (H) 150 - 400 K/uL   nRBC 0.0 0.0 - 0.2 %  Hepatic function panel  Result Value Ref Range   Total Protein 8.5 (H) 6.5 - 8.1 g/dL   Albumin 4.3 3.5 - 5.0 g/dL   AST 25 15 - 41 U/L   ALT 19 0 - 44 U/L   Alkaline Phosphatase 58 38 - 126 U/L   Total Bilirubin 0.6 0.3 - 1.2 mg/dL   Bilirubin, Direct <5.7 0.0 - 0.2 mg/dL   Indirect Bilirubin NOT CALCULATED 0.3 - 0.9 mg/dL  CK  Result Value Ref Range   Total CK 160 49.0 - 397.0 U/L  Hemoglobin A1c  Result Value Ref Range   Hgb A1c MFr Bld 8.7 (H) 4.8 - 5.6 %   Mean Plasma Glucose 202.99 mg/dL  Protime-INR  Result Value Ref Range   Prothrombin Time 13.2 11.4 - 15.2 seconds   INR 1.0 0.8 - 1.2  APTT  Result Value Ref Range   aPTT 96 (H) 24 - 36 seconds  Basic metabolic panel  Result Value Ref Range   Sodium 136 135 - 145 mmol/L   Potassium 5.1 3.5 - 5.1 mmol/L   Chloride 105 98 - 111 mmol/L   CO2 23 22 - 32 mmol/L   Glucose, Bld 131 (H) 70 - 99 mg/dL   BUN 36 (H) 8 - 23 mg/dL   Creatinine, Ser 8.46 (H) 0.61 - 1.24 mg/dL   Calcium 9.0 8.9 - 96.2 mg/dL   GFR calc non Af Amer 29 (L) >60 mL/min   GFR calc Af Amer 33 (L) >60  mL/min   Anion gap 8 5 - 15  CBC  Result Value Ref Range   WBC 5.3 4.0 - 10.5 K/uL   RBC 3.71 (L) 4.22 - 5.81 MIL/uL   Hemoglobin 11.0 (L) 13.0 - 17.0 g/dL   HCT 94.432.3 (L) 39 - 52 %   MCV 87.1 80.0 - 100.0 fL   MCH 29.6 26.0 - 34.0  pg   MCHC 34.1 30.0 - 36.0 g/dL   RDW 96.714.4 59.111.5 - 63.815.5 %   Platelets 288 150 - 400 K/uL   nRBC 0.0 0.0 - 0.2 %  Heparin level (unfractionated)  Result Value Ref Range   Heparin Unfractionated 0.75 (H) 0.30 - 0.70 IU/mL  Glucose, capillary  Result Value Ref Range   Glucose-Capillary 132 (H) 70 - 99 mg/dL  Heparin level (unfractionated)  Result Value Ref Range   Heparin Unfractionated 0.49 0.30 - 0.70 IU/mL  Glucose, capillary  Result Value Ref Range   Glucose-Capillary 188 (H) 70 - 99 mg/dL  Glucose, capillary  Result Value Ref Range   Glucose-Capillary 151 (H) 70 - 99 mg/dL  Lipid panel  Result Value Ref Range   Cholesterol 147 0 - 200 mg/dL   Triglycerides 466186 (H) <150 mg/dL   HDL 29 (L) >59>40 mg/dL   Total CHOL/HDL Ratio 5.1 RATIO   VLDL 37 0 - 40 mg/dL   LDL Cholesterol 81 0 - 99 mg/dL  Basic metabolic panel  Result Value Ref Range   Sodium 137 135 - 145 mmol/L   Potassium 4.1 3.5 - 5.1 mmol/L   Chloride 99 98 - 111 mmol/L   CO2 27 22 - 32 mmol/L   Glucose, Bld 167 (H) 70 - 99 mg/dL   BUN 32 (H) 8 - 23 mg/dL   Creatinine, Ser 9.351.88 (H) 0.61 - 1.24 mg/dL   Calcium 8.8 (L) 8.9 - 10.3 mg/dL   GFR calc non Af Amer 36 (L) >60 mL/min   GFR calc Af Amer 42 (L) >60 mL/min   Anion gap 11 5 - 15  Glucose, capillary  Result Value Ref Range   Glucose-Capillary 157 (H) 70 - 99 mg/dL  Heparin level (unfractionated)  Result Value Ref Range   Heparin Unfractionated 0.53 0.30 - 0.70 IU/mL  Heparin level (unfractionated)  Result Value Ref Range   Heparin Unfractionated 0.58 0.30 - 0.70 IU/mL  CBC  Result Value Ref Range   WBC 6.3 4.0 - 10.5 K/uL   RBC 3.59 (L) 4.22 - 5.81 MIL/uL   Hemoglobin 10.7 (L) 13.0 - 17.0 g/dL   HCT 70.131.8 (L) 39 - 52 %   MCV 88.6 80.0 - 100.0 fL   MCH 29.8 26.0 - 34.0 pg   MCHC 33.6 30.0 - 36.0 g/dL   RDW 77.914.1 39.011.5 - 30.015.5 %   Platelets 305 150 - 400 K/uL   nRBC 0.0 0.0 - 0.2 %  Glucose, capillary  Result Value Ref Range   Glucose-Capillary 218 (H)  70 - 99 mg/dL  Glucose, capillary  Result Value Ref Range   Glucose-Capillary 98 70 - 99 mg/dL   Comment 1 Notify RN    Comment 2 Document in Chart   Glucose, capillary  Result Value Ref Range   Glucose-Capillary 126 (H) 70 - 99 mg/dL   Comment 1 Notify RN    Comment 2 Document in Chart   Glucose, capillary  Result Value Ref Range   Glucose-Capillary 183 (H) 70 - 99 mg/dL   Comment 1 Notify RN  Comment 2 Document in Chart   Heparin level (unfractionated)  Result Value Ref Range   Heparin Unfractionated 0.44 0.30 - 0.70 IU/mL  CBC  Result Value Ref Range   WBC 5.2 4.0 - 10.5 K/uL   RBC 3.42 (L) 4.22 - 5.81 MIL/uL   Hemoglobin 10.1 (L) 13.0 - 17.0 g/dL   HCT 06.2 (L) 39 - 52 %   MCV 90.1 80.0 - 100.0 fL   MCH 29.5 26.0 - 34.0 pg   MCHC 32.8 30.0 - 36.0 g/dL   RDW 69.4 85.4 - 62.7 %   Platelets 277 150 - 400 K/uL   nRBC 0.0 0.0 - 0.2 %  HIV Antibody (routine testing w rflx)  Result Value Ref Range   HIV Screen 4th Generation wRfx Non Reactive Non Reactive  Glucose, capillary  Result Value Ref Range   Glucose-Capillary 220 (H) 70 - 99 mg/dL  Basic metabolic panel  Result Value Ref Range   Sodium 141 135 - 145 mmol/L   Potassium 3.8 3.5 - 5.1 mmol/L   Chloride 98 98 - 111 mmol/L   CO2 33 (H) 22 - 32 mmol/L   Glucose, Bld 104 (H) 70 - 99 mg/dL   BUN 22 8 - 23 mg/dL   Creatinine, Ser 0.35 (H) 0.61 - 1.24 mg/dL   Calcium 8.4 (L) 8.9 - 10.3 mg/dL   GFR calc non Af Amer 42 (L) >60 mL/min   GFR calc Af Amer 49 (L) >60 mL/min   Anion gap 10 5 - 15  Glucose, capillary  Result Value Ref Range   Glucose-Capillary 115 (H) 70 - 99 mg/dL  Glucose, capillary  Result Value Ref Range   Glucose-Capillary 118 (H) 70 - 99 mg/dL  Glucose, capillary  Result Value Ref Range   Glucose-Capillary 146 (H) 70 - 99 mg/dL  Glucose, capillary  Result Value Ref Range   Glucose-Capillary 190 (H) 70 - 99 mg/dL  Basic metabolic panel  Result Value Ref Range   Sodium 138 135 - 145 mmol/L    Potassium 4.0 3.5 - 5.1 mmol/L   Chloride 104 98 - 111 mmol/L   CO2 27 22 - 32 mmol/L   Glucose, Bld 191 (H) 70 - 99 mg/dL   BUN 18 8 - 23 mg/dL   Creatinine, Ser 0.09 (H) 0.61 - 1.24 mg/dL   Calcium 8.7 (L) 8.9 - 10.3 mg/dL   GFR calc non Af Amer 44 (L) >60 mL/min   GFR calc Af Amer 51 (L) >60 mL/min   Anion gap 7 5 - 15  CBC  Result Value Ref Range   WBC 6.2 4.0 - 10.5 K/uL   RBC 3.59 (L) 4.22 - 5.81 MIL/uL   Hemoglobin 10.8 (L) 13.0 - 17.0 g/dL   HCT 38.1 (L) 39 - 52 %   MCV 87.7 80.0 - 100.0 fL   MCH 30.1 26.0 - 34.0 pg   MCHC 34.3 30.0 - 36.0 g/dL   RDW 82.9 93.7 - 16.9 %   Platelets 252 150 - 400 K/uL   nRBC 0.0 0.0 - 0.2 %  Glucose, capillary  Result Value Ref Range   Glucose-Capillary 199 (H) 70 - 99 mg/dL  Glucose, capillary  Result Value Ref Range   Glucose-Capillary 156 (H) 70 - 99 mg/dL  POCT Activated clotting time  Result Value Ref Range   Activated Clotting Time 279 seconds  ECHOCARDIOGRAM COMPLETE  Result Value Ref Range   Weight 3,744.01 oz   Height 71 in   BP  132/72 mmHg   Ao pk vel 1.64 m/s   AV Area VTI 2.05 cm2   AR max vel 2.07 cm2   AV Mean grad 6.0 mmHg   AV Peak grad 10.8 mmHg   S' Lateral 3.13 cm   AV Area mean vel 1.97 cm2   Area-P 1/2 2.80 cm2  Troponin I (High Sensitivity)  Result Value Ref Range   Troponin I (High Sensitivity) 4 <18 ng/L  Troponin I (High Sensitivity)  Result Value Ref Range   Troponin I (High Sensitivity) 33 (H) <18 ng/L  Troponin I (High Sensitivity)  Result Value Ref Range   Troponin I (High Sensitivity) 4,164 (HH) <18 ng/L  Troponin I (High Sensitivity)  Result Value Ref Range   Troponin I (High Sensitivity) 1,582 (HH) <18 ng/L  Troponin I (High Sensitivity)  Result Value Ref Range   Troponin I (High Sensitivity) 1,321 (HH) <18 ng/L      Assessment & Plan:   Problem List Items Addressed This Visit    Type 2 diabetes mellitus with other specified complication (HCC) - Primary    Last A1c elevated but  improving 8.7 (12/2019) too soon to repeat A1c No hypoglycemia Complications - CKD-III nephropathy, other neuropathy from spine but less likely DM neuropathy, other including hyperlipidemia, GERD, obesity - increases risk of future cardiovascular complications  Off Tradjenta, duplicate therapy/cost, Invokana Failed Trulicity 0.75 and 1.5mg  weekly d/t GI intolerance OFF Metformin due to AKI - and GI intolerance  Plan:  1. CONTINUE Bydureon BCise 2mg  weekly inj - Continue [name] 14 units daily - adjust accordingly as advised if fasting CBG >170 on average go up by 1 unit weekly to around 20 units approx 2. Encourage improved lifestyle - low carb, low sugar diet, reduce portion size, continue improving regular exercise 3. Check CBG, bring log to next visit for review 4. Continue ACEi DUE [facility] he can re schedule      Lumbar radiculopathy    Clinically stable with some persistent chronic low back pain and radiculopathy symptoms into bilateral upper legs Prior improved on ESI x 2 per Dr ([facility]) Advised he may return to them to consider repeat injection in future vs surgical options  Will re order Gabapentin for night-time pain, may adjust dosing accordingly      Relevant Medications   gabapentin (NEURONTIN) 100 MG capsule   CKD stage 3 due to type 2 diabetes mellitus (HCC)    Stable CKD-III Secondary to age, Diabetes with hyperglycemia, HTN, possibly NSAID use  Plan: 1. Improve hydration, limit NSAIDs orally 2. Monitor Cr trend 3. Control DM see A&P      Benign hypertension with CKD (chronic kidney disease) stage III (HCC)    Well-controlled HTN Complication with CKD-III    Plan:  1. Continue current BP regimen - Amlodipine 2.5mg  daily, Lisinopril 40mg  daily 2. Encourage improved lifestyle - low sodium diet, regular exercise improve as tolerated 3. Continue monitor BP outside office, bring readings to next visit, if persistently >140/90 or new  symptoms notify office sooner         Meds ordered this encounter  Medications  . gabapentin (NEURONTIN) 100 MG capsule    Sig: Start 1 capsule daily, increase by 1 cap every 2-3 days as tolerated up to 3 times a day, or may take 3 at once in evening.    Dispense:  90 capsule    Refill:  2      Follow up plan:  Return in about 4 months (around 06/23/2020) for 4 month follow-up DM A1c.   Saralyn Pilar, DO [facility] 02/24/2020, 9:36 AM

## 2020-02-24 NOTE — Assessment & Plan Note (Signed)
Last A1c elevated but improving 8.7 (12/2019) too soon to repeat A1c No hypoglycemia Complications - CKD-III nephropathy, other neuropathy from spine but less likely DM neuropathy, other including hyperlipidemia, GERD, obesity - increases risk of future cardiovascular complications  Off Tradjenta, duplicate therapy/cost, Invokana Failed Trulicity 0.75 and 1.5mg  weekly d/t GI intolerance OFF Metformin due to AKI - and GI intolerance  Plan:  1. CONTINUE Bydureon BCise 2mg  weekly inj - Continue [name] 14 units daily - adjust accordingly as advised if fasting CBG >170 on average go up by 1 unit weekly to around 20 units approx 2. Encourage improved lifestyle - low carb, low sugar diet, reduce portion size, continue improving regular exercise 3. Check CBG, bring log to next visit for review 4. Continue ACEi DUE DM [facility] he can re schedule

## 2020-02-24 NOTE — Patient Instructions (Addendum)
Thank you for coming to the office today.  Limit nightly meal/snack, and improve this lifestyle and we'll see if your blood sugar has improved.  If not improved average < 170 fasting in morning, then I would recommend increasing [name] insulin from 14 back up to gradually around 20 units - go up by 1 unit every week if fasting >170 on average for the week.  Remain off Metformin.  Schedule Diabetic Eye Exam and have them send [place] copy of report.  Leg cramps - Try spoonful of yellow mustard to relieve leg cramps or try daily to prevent the problem  - OTC natural option is Hyland's Leg Cramps (Dissolving tablet) take as needed for muscle cramps  Start Gabapentin 100mg  capsules, take at night for 2-3 nights only, and then increase to 2 times a day for a few days, and then may increase to 3 times a day, it may make you drowsy, if helps significantly at night only, then you can increase instead to 3 capsules at night, instead of 3 times a day - In the future if needed, we can significantly increase the dose if tolerated well, some common doses are 300mg  three times a day up to 600mg  three times a day, usually it takes several weeks or months to get to higher doses   Please schedule a Follow-up Appointment to: Return in about 4 months (around 06/23/2020) for 4 month follow-up DM A1c.  If you have any other questions or concerns, please feel free to call the office or send a message through MyChart. You may also schedule an earlier appointment if necessary.  Additionally, you may be receiving a survey about your experience at our office within a few days to 1 week by e-mail or mail. We value your feedback.  , DO [facility], 06/25/2020

## 2020-02-24 NOTE — Assessment & Plan Note (Signed)
Stable CKD-III Secondary to age, Diabetes with hyperglycemia, HTN, possibly NSAID use  Plan: 1. Improve hydration, limit NSAIDs orally 2. Monitor Cr trend 3. Control DM see A&P 

## 2020-02-24 NOTE — Assessment & Plan Note (Signed)
Well-controlled HTN Complication with CKD-III    Plan:  1. Continue current BP regimen - Amlodipine 2.5mg  daily, Lisinopril 40mg  daily 2. Encourage improved lifestyle - low sodium diet, regular exercise improve as tolerated 3. Continue monitor BP outside office, bring readings to next visit, if persistently >140/90 or new symptoms notify office sooner

## 2020-02-24 NOTE — Assessment & Plan Note (Signed)
Clinically stable with some persistent chronic low back pain and radiculopathy symptoms into bilateral upper legs Prior improved on ESI x 2 per Dr [facility] ([facility]) Advised he may return to them to consider repeat injection in future vs surgical options  Will re order Gabapentin for night-time pain, may adjust dosing accordingly

## 2020-02-25 ENCOUNTER — Ambulatory Visit: Payer: Medicare HMO

## 2020-02-27 ENCOUNTER — Ambulatory Visit: Payer: Medicare HMO

## 2020-03-03 ENCOUNTER — Ambulatory Visit: Payer: Medicare HMO

## 2020-03-03 DIAGNOSIS — I10 Essential (primary) hypertension: Secondary | ICD-10-CM

## 2020-03-04 MED ORDER — CARVEDILOL 12.5 MG PO TABS: 12.5000 mg | ORAL_TABLET | Freq: Two times a day (BID) | ORAL | 3 refills | Status: DC

## 2020-03-05 ENCOUNTER — Ambulatory Visit: Payer: Medicare HMO

## 2020-03-10 ENCOUNTER — Ambulatory Visit: Payer: Medicare HMO

## 2020-03-11 ENCOUNTER — Telehealth: Payer: Self-pay

## 2020-03-12 ENCOUNTER — Ambulatory Visit: Payer: Medicare HMO

## 2020-03-16 ENCOUNTER — Other Ambulatory Visit: Payer: Self-pay | Admitting: Family Medicine

## 2020-03-16 DIAGNOSIS — K219 Gastro-esophageal reflux disease without esophagitis: Secondary | ICD-10-CM

## 2020-03-16 DIAGNOSIS — H353131 Nonexudative age-related macular degeneration, bilateral, early dry stage: Secondary | ICD-10-CM | POA: Diagnosis not present

## 2020-03-17 ENCOUNTER — Ambulatory Visit: Payer: Medicare HMO

## 2020-03-17 LAB — HM DIABETES EYE EXAM

## 2020-03-18 ENCOUNTER — Encounter: Payer: Self-pay | Admitting: Family Medicine

## 2020-03-18 ENCOUNTER — Ambulatory Visit: Payer: Medicare HMO | Admitting: Pharmacist

## 2020-03-18 DIAGNOSIS — E1169 Type 2 diabetes mellitus with other specified complication: Secondary | ICD-10-CM

## 2020-03-18 NOTE — Patient Instructions (Signed)
Thank you allowing the Chronic Care Management Team to be a part of your care! It was a pleasure speaking with you today!     CCM (Chronic Care Management) Team    Alto Denver RN, MSN, CCM Nurse Care Coordinator  [phone]   [name] PharmD  Clinical Pharmacist  [phone]   Dickie La LCSW Clinical Social Worker [phone]  Visit Information  Goals Addressed              This Visit's Progress     PharmD - Diabetes Management (pt-stated)        CARE PLAN ENTRY (see longitudinal plan of care for additional care plan information)  Current Barriers:   Chronic Disease Management support, education, and care coordination needs related to type 2 diabetes mellitus, hypertension, hyperlipidemia, GERD, gout, seasonal allergies  Financial Barriers - patient has [employer] and reports copay for Bydureon & insulin glargine is cost prohibitive at this time  Lack of blood sugar or blood pressure readings for clinical team  Pharmacist Clinical Goal(s):   Over the next 30 days, patient will work with [employer] Pharmacist and PCP to address needs related to medication assistance and diabetes management  Interventions:  Received coordination of care message from [facility] CPhT Noreene Larsson Simcox letting me know: o Patient APPROVED for renewal of patient assistance for [name] from [place] for 2022 calendar year o Still waiting for decision from [name] patient assistance for 2022 re-enrollment  Follow up with patient today regarding medication assistance o Advise of approval for [name] patient assistance re-enrollment o Counsel on updates to refill process (medication should ship automatically, but to call if down to 2 week supply; not signiture required with delivery)  Unable to review blood sugar and blood pressure monitoring results or completed medication review today as patient currently not home.  Will collaborate with [facility] CPhT Noreene Larsson Simcox to request she assist  patient with patient assistance re-enrollment for Bydrueon through [name] and [name] through [employer]  Patient Self Care Activities:   Self administers medications as prescribed with assistance from his wife o Uses weekly pillbox as adherence tool  Attends all scheduled provider appointments  Calls pharmacy for medication refills  Calls provider office for new concerns or questions  To check blood sugars twice daily and keep log   Please see past updates related to this goal by clicking on the "Past Updates" button in the selected goal        The patient verbalized understanding of instructions, educational materials, and care plan provided today and declined offer to receive copy of patient instructions, educational materials, and care plan.   Will collaborate with Care Guide to request she reach out to patient to schedule follow up appointment when patient is able to be home.  [name], PharmD, [facility] Clinical Pharmacist [facility] [employer] [phone]

## 2020-03-18 NOTE — Chronic Care Management (AMB) (Signed)
Chronic Care Management   Follow Up Note   03/18/2020 Name: [name] MRN: [id number] DOB: 1950-07-10  Referred by: [name], DO Reason for referral : Chronic Care Management (Patient Phone Call)   [name] is a 69 y.o. year old male who is a primary care patient of [name], DO. The CCM team was consulted for assistance with chronic disease management and care coordination needs.    I reached out to [name] by phone today. Unable to review monitoring results or medications as patient is not home during our call.  Review of patient status, including review of consultants reports, relevant laboratory and other test results, and collaboration with appropriate care team members and the patient's provider was performed as part of comprehensive patient evaluation and provision of chronic care management services.    SDOH (Social Determinants of Health) assessments performed: No See Care Plan activities for detailed interventions related to [facility])     Outpatient Encounter Medications as of 03/18/2020  Medication Sig Note  . ACCU-CHEK SMARTVIEW test strip USE 1 STRIP TO CHECK GLUCOSE TWICE DAILY   . acetaminophen (TYLENOL) 500 MG tablet Take 1,000 mg by mouth every 8 (eight) hours as needed.    Marland Kitchen allopurinol (ZYLOPRIM) 100 MG tablet Take 1 tablet (100 mg total) by mouth at bedtime.   Marland Kitchen amLODipine (NORVASC) 2.5 MG tablet Take 1 tablet (2.5 mg total) by mouth daily.   Marland Kitchen aspirin EC 81 MG EC tablet Take 1 tablet (81 mg total) by mouth daily. Swallow whole.   Marland Kitchen atorvastatin (LIPITOR) 40 MG tablet Take 1 tablet (40 mg total) by mouth daily.   Marland Kitchen BYDUREON BCISE 2 MG/0.85ML AUIJ INJECT 2 MG SUBCUTANEOUSLY ONCE A WEEK (Patient taking differently: Inject 2 mg into the skin once a week. ) 01/13/2020: On Thursdays  . calcium carbonate (TUMS - DOSED IN MG ELEMENTAL CALCIUM) 500 MG chewable tablet Chew 2 tablets by mouth at bedtime as needed for  indigestion or heartburn.    . carvedilol (COREG) 12.5 MG tablet Take 1 tablet (12.5 mg total) by mouth 2 (two) times daily.   Marland Kitchen docusate sodium (COLACE) 100 MG capsule Take 100 mg by mouth 2 (two) times daily.    Marland Kitchen ezetimibe (ZETIA) 10 MG tablet Take 1 tablet (10 mg total) by mouth daily.   . fluticasone (FLONASE) 50 MCG/ACT nasal spray USE 2 SPRAYS IN EACH NOSTRIL ONCE DAILY USE FOR 4 TO 6 WEEKS, THEN STOP AND USE SEASONALLY OR AS NEEDED.   Marland Kitchen gabapentin (NEURONTIN) 100 MG capsule Start 1 capsule daily, increase by 1 cap every 2-3 days as tolerated up to 3 times a day, or may take 3 at once in evening.   . Insulin Glargine ([name] KWIKPEN) 100 UNIT/ML Inject 10 Units into the skin at bedtime. (Patient taking differently: Inject 14 Units into the skin at bedtime. )   . Insulin Pen Needle (NOVOFINE PLUS) 32G X 4 MM MISC Use with [name] insulin daily injection as instructed   . lisinopril (ZESTRIL) 40 MG tablet Take 1 tablet (40 mg total) by mouth daily.   . nitroGLYCERIN (NITROSTAT) 0.4 MG SL tablet Place 1 tablet (0.4 mg total) under the tongue every 5 (five) minutes as needed for chest pain.   Marland Kitchen omeprazole (PRILOSEC) 20 MG capsule Take 1 capsule (20 mg total) by mouth daily before breakfast.   . ticagrelor (BRILINTA) 90 MG TABS tablet Take 1 tablet (90 mg total) by mouth 2 (two)  times daily.    No facility-administered encounter medications on file as of 03/18/2020.    Goals Addressed              This Visit's Progress   .  PharmD - Diabetes Management (pt-stated)        CARE PLAN ENTRY (see longitudinal plan of care for additional care plan information)  Current Barriers:  . Chronic Disease Management support, education, and care coordination needs related to type 2 diabetes mellitus, hypertension, hyperlipidemia, GERD, gout, seasonal allergies . Financial Barriers - patient has [employer] and reports copay for Bydureon & insulin glargine is cost prohibitive at this  time . Lack of blood sugar or blood pressure readings for clinical team  Pharmacist Clinical Goal(s):  Marland Kitchen Over the next 30 days, patient will work with CM Pharmacist and PCP to address needs related to medication assistance and diabetes management  Interventions: . Received coordination of care message from [facility] CPhT Noreene Larsson Simcox letting me know: o Patient APPROVED for renewal of patient assistance for [name] from [place] for 2022 calendar year o Still waiting for decision from [name] patient assistance for 2022 re-enrollment . Follow up with patient today regarding medication assistance o Advise of approval for [name] patient assistance re-enrollment o Counsel on updates to refill process (medication should ship automatically, but to call if down to 2 week supply; not signiture required with delivery) . Unable to review blood sugar and blood pressure monitoring results or completed medication review today as patient currently not home. . Will collaborate with [facility] CPhT Noreene Larsson Simcox to request she assist patient with patient assistance re-enrollment for Bydrueon through [name] and [name] through [employer]  Patient Self Care Activities:  . Self administers medications as prescribed with assistance from his wife o Uses weekly pillbox as adherence tool . Attends all scheduled provider appointments . Calls pharmacy for medication refills . Calls provider office for new concerns or questions . To check blood sugars twice daily and keep log   Please see past updates related to this goal by clicking on the "Past Updates" button in the selected goal        Plan  Will collaborate with Care Guide to request she reach out to patient to schedule follow up appointment when patient is able to be home.  [name], PharmD, [facility] Clinical Pharmacist [facility] Medical [employer] [phone]

## 2020-03-19 ENCOUNTER — Ambulatory Visit: Payer: Medicare HMO

## 2020-03-24 ENCOUNTER — Ambulatory Visit: Payer: Medicare HMO

## 2020-03-25 ENCOUNTER — Ambulatory Visit: Payer: Self-pay | Admitting: Pharmacist

## 2020-03-25 ENCOUNTER — Telehealth: Payer: Self-pay

## 2020-03-25 DIAGNOSIS — Z794 Long term (current) use of insulin: Secondary | ICD-10-CM

## 2020-03-25 NOTE — Chronic Care Management (AMB) (Signed)
Chronic Care Management   Follow Up Note   03/25/2020 Name: [name] MRN: [id number] DOB: [birth year]  Referred by: [name], DO Reason for referral : Chronic Care Management (Patient Phone Call)   [name] is a 69 y.o. year old male who is a primary care patient of [name], DO. The CCM team was consulted for assistance with chronic disease management and care coordination needs.    I reached out to [name] by phone today. Speak with patient's wife (listed on DPR in chart)  Review of patient status, including review of consultants reports, relevant laboratory and other test results, and collaboration with appropriate care team members and the patient's provider was performed as part of comprehensive patient evaluation and provision of chronic care management services.    SDOH (Social Determinants of Health) assessments performed: No See Care Plan activities for detailed interventions related to [facility])     Outpatient Encounter Medications as of 03/25/2020  Medication Sig Note  . ACCU-CHEK SMARTVIEW test strip USE 1 STRIP TO CHECK GLUCOSE TWICE DAILY   . acetaminophen (TYLENOL) 500 MG tablet Take 1,000 mg by mouth every 8 (eight) hours as needed.    Marland Kitchen allopurinol (ZYLOPRIM) 100 MG tablet Take 1 tablet (100 mg total) by mouth at bedtime.   Marland Kitchen amLODipine (NORVASC) 2.5 MG tablet Take 1 tablet (2.5 mg total) by mouth daily.   Marland Kitchen aspirin EC 81 MG EC tablet Take 1 tablet (81 mg total) by mouth daily. Swallow whole.   Marland Kitchen atorvastatin (LIPITOR) 40 MG tablet Take 1 tablet (40 mg total) by mouth daily.   Marland Kitchen BYDUREON BCISE 2 MG/0.85ML AUIJ INJECT 2 MG SUBCUTANEOUSLY ONCE A WEEK (Patient taking differently: Inject 2 mg into the skin once a week. ) 01/13/2020: On Thursdays  . calcium carbonate (TUMS - DOSED IN MG ELEMENTAL CALCIUM) 500 MG chewable tablet Chew 2 tablets by mouth at bedtime as needed for indigestion or heartburn.    . carvedilol  (COREG) 12.5 MG tablet Take 1 tablet (12.5 mg total) by mouth 2 (two) times daily.   Marland Kitchen docusate sodium (COLACE) 100 MG capsule Take 100 mg by mouth 2 (two) times daily.    Marland Kitchen ezetimibe (ZETIA) 10 MG tablet Take 1 tablet (10 mg total) by mouth daily.   . fluticasone (FLONASE) 50 MCG/ACT nasal spray USE 2 SPRAYS IN EACH NOSTRIL ONCE DAILY USE FOR 4 TO 6 WEEKS, THEN STOP AND USE SEASONALLY OR AS NEEDED.   Marland Kitchen gabapentin (NEURONTIN) 100 MG capsule Start 1 capsule daily, increase by 1 cap every 2-3 days as tolerated up to 3 times a day, or may take 3 at once in evening.   . Insulin Glargine ([name] KWIKPEN) 100 UNIT/ML Inject 10 Units into the skin at bedtime. (Patient taking differently: Inject 14 Units into the skin at bedtime. )   . Insulin Pen Needle (NOVOFINE PLUS) 32G X 4 MM MISC Use with [name] insulin daily injection as instructed   . lisinopril (ZESTRIL) 40 MG tablet Take 1 tablet (40 mg total) by mouth daily.   . nitroGLYCERIN (NITROSTAT) 0.4 MG SL tablet Place 1 tablet (0.4 mg total) under the tongue every 5 (five) minutes as needed for chest pain.   Marland Kitchen omeprazole (PRILOSEC) 20 MG capsule Take 1 capsule (20 mg total) by mouth daily before breakfast.   . ticagrelor (BRILINTA) 90 MG TABS tablet Take 1 tablet (90 mg total) by mouth 2 (two) times daily.    No  facility-administered encounter medications on file as of 03/25/2020.    Goals Addressed              This Visit's Progress   .  PharmD - Diabetes Management (pt-stated)        CARE PLAN ENTRY (see longitudinal plan of care for additional care plan information)  Current Barriers:  . Chronic Disease Management support, education, and care coordination needs related to type 2 diabetes mellitus, hypertension, hyperlipidemia, GERD, gout, seasonal allergies . Financial Barriers - patient has [employer] and reports copay for Bydureon & insulin glargine is cost prohibitive at this time . Lack of blood sugar or blood  pressure readings for clinical team  Pharmacist Clinical Goal(s):  Marland Kitchen Over the next 30 days, patient will work with CM Pharmacist and PCP to address needs related to medication assistance and diabetes management  Interventions: . Receive a coordination of care call from [facility] Berg [name] advising that she received message from PCP that patient had contacted the clinic about needing to complete further paperwork for 2022 [name] patient assistance program re-enrollment o [facility] Berg states the document from patient was dated from October. Daniel Larsson has since received confirmation from [place] with [name] patient assistance program on 03/04/2020 that patient was approved for [employer] program re-enrollment 03/28/2020-03/27/2021 . Follow up with patient's wife today regarding meidcation assistance o Again notify patient's wife that patient has been APPROVED for renewal of patient assistance for [name] from [place] for 2022 calendar year . Will continue to collaborate with [name] [name] for her assistance with patient assistance re-enrollment for Bydrueon through [name]   Patient Self Care Activities:  . Self administers medications as prescribed with assistance from his wife o Uses weekly pillbox as adherence tool . Attends all scheduled provider appointments . Calls pharmacy for medication refills . Calls provider office for new concerns or questions . To check blood sugars twice daily and keep log   Please see past updates related to this goal by clicking on the "Past Updates" button in the selected goal        Plan  Telephone follow up appointment with care management team member scheduled for: 04/08/2020 at 3:45 pm  [name], PharmD, [facility] Clinical Pharmacist [facility] [phone]

## 2020-03-25 NOTE — Patient Instructions (Signed)
Thank you allowing the Chronic Care Management Team to be a part of your care! It was a pleasure speaking with you today!     CCM (Chronic Care Management) Team    Alto Denver RN, MSN, CCM Nurse Care Coordinator  [phone]   [name] PharmD  Clinical Pharmacist  [phone]   Dickie La LCSW Clinical Social Worker [phone]  Visit Information  Goals Addressed              This Visit's Progress     PharmD - Diabetes Management (pt-stated)        CARE PLAN ENTRY (see longitudinal plan of care for additional care plan information)  Current Barriers:   Chronic Disease Management support, education, and care coordination needs related to type 2 diabetes mellitus, hypertension, hyperlipidemia, GERD, gout, seasonal allergies  Financial Barriers - patient has [employer] and reports copay for Bydureon & insulin glargine is cost prohibitive at this time  Lack of blood sugar or blood pressure readings for clinical team  Pharmacist Clinical Goal(s):   Over the next 30 days, patient will work with CM Pharmacist and PCP to address needs related to medication assistance and diabetes management  Interventions:  Receive a coordination of care call from [facility] CPhT Noreene Larsson Simcox advising that she received message from PCP that patient had contacted the clinic about needing to complete further paperwork for 2022 [name] patient assistance program re-enrollment o [facility] CPhT states the document from patient was dated from October. Noreene Larsson has since received confirmation from [place] with [name] patient assistance program on 03/04/2020 that patient was approved for [employer] program re-enrollment 03/28/2020-03/27/2021  Follow up with patient's wife today regarding meidcation assistance o Again notify patient's wife that patient has been APPROVED for renewal of patient assistance for [name] from [place] for 2022 calendar year  Will continue to collaborate with [facility] CPhT Noreene Larsson  Simcox for her assistance with patient assistance re-enrollment for Bydrueon through [name]   Patient Self Care Activities:   Self administers medications as prescribed with assistance from his wife o Uses weekly pillbox as adherence tool  Attends all scheduled provider appointments  Calls pharmacy for medication refills  Calls provider office for new concerns or questions  To check blood sugars twice daily and keep log   Please see past updates related to this goal by clicking on the "Past Updates" button in the selected goal        The patient verbalized understanding of instructions, educational materials, and care plan provided today and declined offer to receive copy of patient instructions, educational materials, and care plan.   Telephone follow up appointment with care management team member scheduled for: 04/08/2020 at 3:45 pm  [name], PharmD, [facility] Clinical Pharmacist [facility] [phone]

## 2020-03-25 NOTE — Progress Notes (Signed)
Patient has been scheduled

## 2020-03-25 NOTE — Chronic Care Management (AMB) (Signed)
  Care Management   Note  03/25/2020 Name: [name] MRN: [id number] DOB: 14-Jun-1950  [name] is a 69 y.o. year old male who is a primary care patient of [name], DO and is actively engaged with the care management team. I reached out to [name] by phone today to assist with re-scheduling a follow up visit with the Pharmacist  Follow up plan: Telephone appointment with care management team member scheduled for:04/09/2019  Penne Lash, RMA Care Guide, Embedded Care Coordination [facility]  [place], [place] [address] Direct Dial: [phone] [email or web address]: [email or web address]

## 2020-03-25 NOTE — Chronic Care Management (AMB) (Signed)
  Care Management   Note  03/25/2020 Name: Kleber Crean MRN: [id number] DOB: 1950/04/17  Lamondre Wesche is a 69 y.o. year old male who is a primary care patient of [name], DO and is actively engaged with the care management team. I reached out to [name] by phone today to assist with re-scheduling a follow up visit with the Pharmacist  Follow up plan: Unsuccessful telephone outreach attempt made. A HIPAA compliant phone message was left for the patient providing contact information and requesting a return call.  The care management team will reach out to the patient again over the next 7 days.  If patient returns call to provider office, please advise to call Embedded Care Management Care Guide Penne Lash  at [phone]  Penne Lash, RMA Care Guide, Embedded Care Coordination [facility]  [place], [place] [address] Direct Dial: [phone] [email or web address]

## 2020-03-26 ENCOUNTER — Other Ambulatory Visit: Payer: Self-pay

## 2020-03-26 ENCOUNTER — Ambulatory Visit: Payer: Medicare HMO

## 2020-03-26 ENCOUNTER — Ambulatory Visit (INDEPENDENT_AMBULATORY_CARE_PROVIDER_SITE_OTHER): Payer: Medicare HMO | Admitting: Cardiology

## 2020-03-26 ENCOUNTER — Encounter: Payer: Self-pay | Admitting: Cardiology

## 2020-03-26 VITALS — BP 134/72 | HR 79 | Ht 71.0 in | Wt 228.2 lb

## 2020-03-26 DIAGNOSIS — E7800 Pure hypercholesterolemia, unspecified: Secondary | ICD-10-CM

## 2020-03-26 DIAGNOSIS — Z720 Tobacco use: Secondary | ICD-10-CM | POA: Diagnosis not present

## 2020-03-26 DIAGNOSIS — I2510 Atherosclerotic heart disease of native coronary artery without angina pectoris: Secondary | ICD-10-CM | POA: Diagnosis not present

## 2020-03-26 DIAGNOSIS — I10 Essential (primary) hypertension: Secondary | ICD-10-CM | POA: Diagnosis not present

## 2020-03-26 NOTE — Progress Notes (Signed)
Cardiology Office Note:    Date:  03/26/2020   ID:  [name], DOB [birth year], MRN [id number]  PCP:  [name], DO  Cardiologist:  Debbe Odea, MD  Electrophysiologist:  None   Referring MD: Saralyn Pilar *   Chief Complaint  Patient presents with  . Follow-up    "doing well."     History of Present Illness:    [name] is a 69 y.o. male with a hx of CAD/NSTEMI (PCI/DES to RCA 12/2019), hypertension, hyperlipidemia, CKD-3, GERD, PE treated with Eliquis, chews tobacco who presents for follow-up.    He had some chest tightness after last visit.  Coreg increased to 12.5 mg twice daily for antianginal benefit.  He states tolerating increased dose of Coreg.  Symptoms have improved.  Feels well, [place].  Has no concerns at this time.  He still chews tobacco.  Prior notes Echocardiogram 12/2019, normal ejection fraction 60 to 65%. Left heart cath 12/30/2019 proximal RCA 90%, proximal LAD 40%, moderate left circumflex disease.  Status post successful angioplasty and drug-eluting stent placement to proximal RCA.  Past Medical History:  Diagnosis Date  . Allergy   . Chronic headaches   . GERD (gastroesophageal reflux disease)   . Heart burn   . Hyperlipidemia   . Hypertension     Past Surgical History:  Procedure Laterality Date  . BACK SURGERY  02/26/2019  . KNEE SURGERY    . LEFT HEART CATH AND CORONARY ANGIOGRAPHY N/A 12/30/2019   Procedure: LEFT HEART CATH AND CORONARY ANGIOGRAPHY;  Surgeon: Iran Ouch, MD;  Location: [facility] INVASIVE CV LAB;  Service: Cardiovascular;  Laterality: N/A;  . SHOULDER SURGERY    . TONSILLECTOMY      Current Medications: Current Meds  Medication Sig  . ACCU-CHEK SMARTVIEW test strip USE 1 STRIP TO CHECK GLUCOSE TWICE DAILY  . acetaminophen (TYLENOL) 500 MG tablet Take 1,000 mg by mouth every 8 (eight) hours as needed.   Marland Kitchen allopurinol  (ZYLOPRIM) 100 MG tablet Take 1 tablet (100 mg total) by mouth at bedtime.  Marland Kitchen amLODipine (NORVASC) 2.5 MG tablet Take 1 tablet (2.5 mg total) by mouth daily.  Marland Kitchen aspirin EC 81 MG EC tablet Take 1 tablet (81 mg total) by mouth daily. Swallow whole.  Marland Kitchen atorvastatin (LIPITOR) 40 MG tablet Take 1 tablet (40 mg total) by mouth daily.  Marland Kitchen BYDUREON BCISE 2 MG/0.85ML AUIJ INJECT 2 MG SUBCUTANEOUSLY ONCE A WEEK (Patient taking differently: Inject 2 mg into the skin once a week.)  . calcium carbonate (TUMS - DOSED IN MG ELEMENTAL CALCIUM) 500 MG chewable tablet Chew 2 tablets by mouth at bedtime as needed for indigestion or heartburn.   . carvedilol (COREG) 12.5 MG tablet Take 1 tablet (12.5 mg total) by mouth 2 (two) times daily.  Marland Kitchen docusate sodium (COLACE) 100 MG capsule Take 100 mg by mouth 2 (two) times daily.   Marland Kitchen ezetimibe (ZETIA) 10 MG tablet Take 1 tablet (10 mg total) by mouth daily.  . fluticasone (FLONASE) 50 MCG/ACT nasal spray USE 2 SPRAYS IN EACH NOSTRIL ONCE DAILY USE FOR 4 TO 6 WEEKS, THEN STOP AND USE SEASONALLY OR AS NEEDED.  Marland Kitchen gabapentin (NEURONTIN) 100 MG capsule Start 1 capsule daily, increase by 1 cap every 2-3 days as tolerated up to 3 times a day, or may take 3 at once in evening.  . Insulin Glargine ([name] KWIKPEN) 100 UNIT/ML Inject 10 Units  into the skin at bedtime. (Patient taking differently: Inject 14 Units into the skin at bedtime.)  . Insulin Pen Needle (NOVOFINE PLUS) 32G X 4 MM MISC Use with [name] insulin daily injection as instructed  . lisinopril (ZESTRIL) 40 MG tablet Take 1 tablet (40 mg total) by mouth daily.  . nitroGLYCERIN (NITROSTAT) 0.4 MG SL tablet Place 1 tablet (0.4 mg total) under the tongue every 5 (five) minutes as needed for chest pain.  Marland Kitchen. omeprazole (PRILOSEC) 20 MG capsule Take 1 capsule (20 mg total) by mouth daily before breakfast.  . ticagrelor (BRILINTA) 90 MG TABS tablet Take 1 tablet (90 mg total) by mouth 2 (two) times daily.     Allergies:    Crestor [rosuvastatin calcium], Invokana [canagliflozin], Prednisone, and Sulfamethizole   Social History   Socioeconomic History  . Marital status: Married    Spouse name: Not on file  . Number of children: Not on file  . Years of education: Not on file  . Highest education level: Not on file  Occupational History  . Occupation: retired  Tobacco Use  . Smoking status: Former Smoker    Types: Cigars    Quit date: 2000    Years since quitting: 22.0  . Smokeless tobacco: Current User    Types: Chew  . Tobacco comment: wife states patient never smoked cigarettes, just chewing tobacco  Vaping Use  . Vaping Use: Never used  Substance and Sexual Activity  . Alcohol use: Not Currently    Alcohol/week: 0.0 standard drinks  . Drug use: No  . Sexual activity: Yes    Birth control/protection: Inserts  Other Topics Concern  . Not on file  Social History Narrative  . Not on file   Social Determinants of Health   Financial Resource Strain: Low Risk   . Difficulty of Paying Living Expenses: Not hard at all  Food Insecurity: No Food Insecurity  . Worried About Programme researcher, broadcasting/film/videounning Out of Food in the Last Year: Never true  . Ran Out of Food in the Last Year: Never true  Transportation Needs: No Transportation Needs  . Lack of Transportation (Medical): No  . Lack of Transportation (Non-Medical): No  Physical Activity: Inactive  . Days of Exercise per Week: 0 days  . Minutes of Exercise per Session: 0 min  Stress: No Stress Concern Present  . Feeling of Stress : Not at all  Social Connections: Not on file     Family History: The patient's family history includes Cancer in his father; Heart disease in his brother and sister. There is no history of Prostate cancer.  ROS:   Please see the history of present illness.     All other systems reviewed and are negative.  EKGs/Labs/Other Studies Reviewed:    The following studies were reviewed today:   EKG:  EKG not  ordered today.   Recent  Labs: 12/27/2019: ALT 19 12/31/2019: BUN 18; Creatinine, Ser 1.59; Hemoglobin 10.8; Platelets 252; Potassium 4.0; Sodium 138  Recent Lipid Panel    Component Value Date/Time   CHOL 147 12/29/2019 0026   CHOL 212 (H) 11/12/2019 0829   TRIG 186 (H) 12/29/2019 0026   HDL 29 (L) 12/29/2019 0026   HDL 32 (L) 11/12/2019 0829   CHOLHDL 5.1 12/29/2019 0026   VLDL 37 12/29/2019 0026   LDLCALC 81 12/29/2019 0026   LDLCALC 139 (H) 11/12/2019 0829    Physical Exam:    VS:  BP 134/72 (BP Location: Left Arm, Patient Position: Sitting, Cuff  Size: Normal)   Pulse 79   Ht 5\' 11"  (1.803 m)   Wt 228 lb 4 oz (103.5 kg)   SpO2 97%   BMI 31.83 kg/m     Wt Readings from Last 3 Encounters:  03/26/20 228 lb 4 oz (103.5 kg)  02/24/20 232 lb (105.2 kg)  01/23/20 235 lb 6.4 oz (106.8 kg)     GEN:  Well nourished, well developed in no acute distress HEENT: Normal NECK: No JVD; No carotid bruits LYMPHATICS: No lymphadenopathy CARDIAC: RRR, no murmurs, rubs, gallops RESPIRATORY:  Clear to auscultation without rales, wheezing or rhonchi  ABDOMEN: Soft, non-tender, non-distended MUSCULOSKELETAL:  No edema; No deformity  SKIN: Warm and dry NEUROLOGIC:  Alert and oriented x 3 PSYCHIATRIC:  Normal affect   ASSESSMENT:    1. Coronary artery disease involving native coronary artery of native heart, unspecified whether angina present   2. Primary hypertension   3. Pure hypercholesterolemia   4. Tobacco chew use    PLAN:    1.  History of CAD/NSTEMI status post PCI to RCA (12/30/2019).  Denies chest pain on increased dose of Coreg..  Continue aspirin, Brilinta for at least 12 months.  Continue Lipitor,  Coreg to 12.5 mg twice daily.  Echo showed normal EF, 60 to 65%.    2.  History of hypertension, BP controlled.  Continue Coreg, lisinopril.  3.  History of hyperlipidemia, continue Lipitor.  4.  Patient chews tobacco.  Cessation advised  Follow-up in 3 months.  Total encounter time 40 minutes   Greater than 50% was spent in counseling and coordination of care with the patient   Medication Adjustments/Labs and Tests Ordered: Current medicines are reviewed at length with the patient today.  Concerns regarding medicines are outlined above.  No orders of the defined types were placed in this encounter.  No orders of the defined types were placed in this encounter.   Patient Instructions  Medication Instructions:  Your physician recommends that you continue on your current medications as directed. Please refer to the Current Medication list given to you today.  *If you need a refill on your cardiac medications before your next appointment, please call your pharmacy*   Lab Work: None Ordered If you have labs (blood work) drawn today and your tests are completely normal, you will receive your results only by: 02/29/2020 MyChart Message (if you have MyChart) OR . A paper copy in the mail If you have any lab test that is abnormal or we need to change your treatment, we will call you to review the results.   Testing/Procedures: None Ordered   Follow-Up: At [facility], you and your health needs are our priority.  As part of our continuing mission to provide you with exceptional heart care, we have created designated Provider Care Teams.  These Care Teams include your primary Cardiologist (physician) and Advanced Practice Providers (APPs -  Physician Assistants and Nurse Practitioners) who all work together to provide you with the care you need, when you need it.  We recommend signing up for the patient portal called "MyChart".  Sign up information is provided on this After Visit Summary.  MyChart is used to connect with patients for Virtual Visits (Telemedicine).  Patients are able to view lab/test results, encounter notes, upcoming appointments, etc.  Non-urgent messages can be sent to your provider as well.   To learn more about what you can do with MyChart, go to  [facility].    Your next appointment:  6 month(s)  The format for your next appointment:   In Person  Provider:   Debbe Odea, MD   Other Instructions      Signed, Debbe Odea, MD  03/26/2020 1:15 PM    [facility]

## 2020-03-26 NOTE — Patient Instructions (Signed)

## 2020-03-31 ENCOUNTER — Ambulatory Visit: Payer: Medicare HMO

## 2020-04-02 ENCOUNTER — Ambulatory Visit: Payer: Medicare HMO

## 2020-04-07 ENCOUNTER — Ambulatory Visit: Payer: Medicare HMO

## 2020-04-08 ENCOUNTER — Telehealth: Payer: Medicare HMO

## 2020-04-09 ENCOUNTER — Ambulatory Visit: Payer: Medicare HMO

## 2020-04-09 DIAGNOSIS — M47817 Spondylosis without myelopathy or radiculopathy, lumbosacral region: Secondary | ICD-10-CM | POA: Diagnosis not present

## 2020-04-09 DIAGNOSIS — M545 Low back pain, unspecified: Secondary | ICD-10-CM | POA: Diagnosis not present

## 2020-04-14 ENCOUNTER — Ambulatory Visit: Payer: Medicare HMO

## 2020-04-16 ENCOUNTER — Ambulatory Visit: Payer: Medicare HMO

## 2020-04-21 ENCOUNTER — Ambulatory Visit: Payer: Medicare HMO

## 2020-04-22 ENCOUNTER — Ambulatory Visit: Payer: Medicare HMO | Admitting: Pharmacist

## 2020-04-22 DIAGNOSIS — I10 Essential (primary) hypertension: Secondary | ICD-10-CM

## 2020-04-22 DIAGNOSIS — E1169 Type 2 diabetes mellitus with other specified complication: Secondary | ICD-10-CM

## 2020-04-22 NOTE — Chronic Care Management (AMB) (Signed)
Chronic Care Management Pharmacy Note  04/22/2020 Name:  [name] MRN:  [id number] DOB:  [birth year]  Subjective: [name] is an 70 y.o. year old male who is a primary patient of [name], DO.  The CCM team was consulted for assistance with disease management and care coordination needs.    Engaged with patient by telephone for follow up visit in response to provider referral for pharmacy case management and/or care coordination services.   Consent to Services:  The patient was given information about Chronic Care Management services, agreed to services, and gave verbal consent prior to initiation of services.  Please see initial visit note for detailed documentation.   Objective:  Lab Results  Component Value Date   CREATININE 1.59 (H) 12/31/2019   CREATININE 1.65 (H) 12/30/2019   CREATININE 1.88 (H) 12/29/2019  Result from [facility] shared record: Creatinine: 1.45 mg/dL; eGFR 49 on 01/23/2020  Lab Results  Component Value Date   HGBA1C 8.7 (H) 12/27/2019       Component Value Date/Time   CHOL 147 12/29/2019 0026   CHOL 212 (H) 11/12/2019 0829   TRIG 186 (H) 12/29/2019 0026   HDL 29 (L) 12/29/2019 0026   HDL 32 (L) 11/12/2019 0829   CHOLHDL 5.1 12/29/2019 0026   VLDL 37 12/29/2019 0026   LDLCALC 81 12/29/2019 0026   LDLCALC 139 (H) 11/12/2019 0829    BP Readings from Last 3 Encounters:  03/26/20 134/72  02/24/20 136/62  01/10/20 134/72    Assessment: Review of patient past medical history, allergies, medications, health status, including review of consultants reports, laboratory and other test data, was performed as part of comprehensive evaluation and provision of chronic care management services.   SDOH:  (Social Determinants of Health) assessments and interventions performed: none   CCM Care Plan  Allergies  Allergen Reactions  . Crestor [Rosuvastatin Calcium] Other (See Comments)    myalgia  . Invokana  [Canagliflozin] Other (See Comments)    dehydrated  . Prednisone Other (See Comments)  . Sulfamethizole     Medications Reviewed Today    Reviewed by [name], MD (Physician) on 03/26/20 at 1154  Med List Status: <None>  Medication Order Taking? Sig Documenting Provider Last Dose Status Informant  ACCU-CHEK SMARTVIEW test strip [id number] Yes USE 1 STRIP TO CHECK GLUCOSE TWICE DAILY Karamalegos, [name], DO Taking Active   acetaminophen (TYLENOL) 500 MG tablet [id number] Yes Take 1,000 mg by mouth every 8 (eight) hours as needed.  [provider] Taking Active Spouse/Significant Other  allopurinol (ZYLOPRIM) 100 MG tablet [id number] Yes Take 1 tablet (100 mg total) by mouth at bedtime. [name], DO Taking Active   amLODipine (NORVASC) 2.5 MG tablet [id number] Yes Take 1 tablet (2.5 mg total) by mouth daily. [name], DO Taking Active   aspirin EC 81 MG EC tablet [id number] Yes Take 1 tablet (81 mg total) by mouth daily. Swallow whole. [name], MD Taking Active   atorvastatin (LIPITOR) 40 MG tablet [id number] Yes Take 1 tablet (40 mg total) by mouth daily. [name], DO Taking Active   BYDUREON BCISE 2 MG/0.85ML [place] [id number] Yes INJECT 2 MG SUBCUTANEOUSLY ONCE A WEEK  Patient taking differently: Inject 2 mg into the skin once a week.   [name], DO Taking Active Spouse/Significant Other           Med Note Ventura Sellers Jan 13, 2020  3:32 PM) On  Thursdays  calcium carbonate (TUMS - DOSED IN MG ELEMENTAL CALCIUM) 500 MG chewable tablet [id number] Yes Chew 2 tablets by mouth at bedtime as needed for indigestion or heartburn.  [provider] Taking Active Spouse/Significant Other  carvedilol (COREG) 12.5 MG tablet [id number] Yes Take 1 tablet (12.5 mg total) by mouth 2 (two) times daily. [name], DO Taking Active   docusate sodium (COLACE) 100 MG capsule [id number] Yes Take  100 mg by mouth 2 (two) times daily.  [provider] Taking Active Spouse/Significant Other  ezetimibe (ZETIA) 10 MG tablet [id number] Yes Take 1 tablet (10 mg total) by mouth daily. [name], DO Taking Active   fluticasone (FLONASE) 50 MCG/ACT nasal spray [id number] Yes USE 2 SPRAYS IN EACH NOSTRIL ONCE DAILY USE FOR 4 TO 6 WEEKS, THEN STOP AND USE SEASONALLY OR AS NEEDED. [name], DO Taking Active   gabapentin (NEURONTIN) 100 MG capsule [id number] Yes Start 1 capsule daily, increase by 1 cap every 2-3 days as tolerated up to 3 times a day, or may take 3 at once in evening. [name], DO Taking Active   Insulin Glargine [address] [employer] KWIKPEN) 100 UNIT/ML [id number] Yes Inject 10 Units into the skin at bedtime.  Patient taking differently: Inject 14 Units into the skin at bedtime.   [name], MD Taking Active   Insulin Pen Needle (NOVOFINE PLUS) 32G X 4 MM MISC [id number] Yes Use with [name] insulin daily injection as instructed [name], DO Taking Active   lisinopril (ZESTRIL) 40 MG tablet [id number] Yes Take 1 tablet (40 mg total) by mouth daily. [name], DO Taking Active   nitroGLYCERIN (NITROSTAT) 0.4 MG SL tablet [id number] Yes Place 1 tablet (0.4 mg total) under the tongue every 5 (five) minutes as needed for chest pain. [name], MD Taking Active   omeprazole (PRILOSEC) 20 MG capsule [id number] Yes Take 1 capsule (20 mg total) by mouth daily before breakfast. [name], DO Taking Active   ticagrelor (BRILINTA) 90 MG TABS tablet [id number] Yes Take 1 tablet (90 mg total) by mouth 2 (two) times daily. [name], DO Taking Active           Patient Active Problem List   Diagnosis Date Noted  . Hx of gout   . Elevated PSA, less than 10 ng/ml 10/03/2017  . Lumbar radiculopathy 10/03/2017  . Drug-induced myopathy 10/03/2017  . Obesity (BMI 30.0-34.9) 10/03/2017  .  History of pulmonary embolus (PE) 12/05/2016  . Chronic pain of left knee 10/07/2016  . CKD stage 3 due to type 2 diabetes mellitus ([place]) 10/07/2016  . Muscle strain 11/17/2015  . Low back pain 10/08/2015  . Hx of hematuria 07/16/2015  . Ganglion cyst of left foot 01/23/2015  . Smokeless tobacco use 01/12/2015  . Type 2 diabetes mellitus with other specified complication ([place]) 92/92/4462  . Benign hypertension with CKD (chronic kidney disease) stage III ([place]) 10/16/2014  . Hyperlipidemia associated with type 2 diabetes mellitus ([place]) 10/16/2014  . Gout 10/16/2014  . GERD without esophagitis 10/16/2014  . Injury of tendon of upper extremity 05/29/2014  . Glenoid labral tear 05/29/2014  . Complete rotator cuff rupture of left shoulder 05/09/2014  . Infraspinatus tenosynovitis 05/09/2014  . Other synovitis and tenosynovitis, right shoulder 05/09/2014    Conditions to be addressed/monitored: HTN, HLD and DMII  Care Plan : PharmD - Medication Management  Updates made by Vella Raring, [place] since 04/22/2020 12:00 AM  Problem: Disease Progression     Long-Range Goal: Disease Progression Prevented or Minimized   Start Date: 04/22/2020  Expected End Date: 07/21/2020  This Visit's Progress: On track  Priority: High  Note:   Current Barriers:  . Financial Barriers - patient has [employer] and reports copay for Bydureon & insulin glargine is cost prohibitive at this time o Patient APPROVED for patient assistance for [name] from [place] through through 03/27/2021 o Patient APPROVED for patient assistance for Bydureon from [name] through 03/27/2021 . Lack of blood sugar or blood pressure readings for clinical team  Pharmacist Clinical Goal(s):  Marland Kitchen Over the next 90 days, patient will achieve adherence to monitoring guidelines and medication adherence to achieve therapeutic efficacy through collaboration with PharmD and provider.  .   Interventions: . 1:1 collaboration  with [name], DO regarding development and update of comprehensive plan of care as evidenced by provider attestation and co-signature . Inter-disciplinary care team collaboration (see longitudinal plan of care)  Diabetes: . Current treatment: o Bydureon 2 mg weekly on Thursdays o [name] 20 units QHS (reports tapered up dose following direction from PCP as fasting CBG >170 on average) . Current glucose readings:  o Reports has been monitoring home blood sugar, but not consistently keeping log recently o Fasting glucose readings: today: 205; yesterday: 250 o Reports CBGs have been elevated following steroid injection in his back on 1/13, but have slowly improved. - Patient verbalizes understanding of need to monitor closely and adjust back [name] dose accordingly as needed following instruction from PCP . Denies hypoglycemic/hyperglycemic symptoms . Reports has been working on improving his diet, limiting carbohydrate portion sizes (cutting back on bread and sweets) . Reports has lost ~10 lbs over past month . Encourage patient to continue to monitor home blood sugar, restart keeping log of results and to contact office for readings outside established parameters or for new symptoms/concerns  Hypertension: . Controlled; current treatment: o Amlodipine 2.5 mg daily o Carvedilol 12.5 mg twice daily o Lisinopril 40 mg daily  . Denies monitoring home BP recently . Encourage patient to monitor home BP, bring readings to next visit, if persistently >140/90 or new symptoms notify office sooner  Patient Goals/Self-Care Activities . Over the next 90 days, patient will:  - check glucose, document, and provide at future appointments - check blood pressure, document, and provide at future appointments  Follow Up Plan: Telephone follow up appointment with care management team member scheduled for: 05/27/2020 at 8:30 am     Follow Up:  Patient agrees to Care Plan and  Follow-up.  Harlow Asa, PharmD, [place] [phone]

## 2020-04-22 NOTE — Patient Instructions (Signed)
Visit Information  Care Plan : PharmD - Medication Management  Updates made by Daphane Shepherd, RPH since 04/22/2020 12:00 AM    Problem: Disease Progression     Long-Range Goal: Disease Progression Prevented or Minimized   Start Date: 04/22/2020  Expected End Date: 07/21/2020  This Visit's Progress: On track  Priority: High  Note:   Current Barriers:  . Financial Barriers - patient has [employer] and reports copay for Bydureon & insulin glargine is cost prohibitive at this time o Patient APPROVED for patient assistance for [name] from [place] through through 03/27/2021 o Patient APPROVED for patient assistance for Bydureon from [name] through 03/27/2021 . Lack of blood sugar or blood pressure readings for clinical team  Pharmacist Clinical Goal(s):  Marland Kitchen Over the next 90 days, patient will achieve adherence to monitoring guidelines and medication adherence to achieve therapeutic efficacy through collaboration with PharmD and provider.  .   Interventions: . 1:1 collaboration with [name], DO regarding development and update of comprehensive plan of care as evidenced by provider attestation and co-signature . Inter-disciplinary care team collaboration (see longitudinal plan of care)  Diabetes: . Current treatment: o Bydureon 2 mg weekly on Thursdays o [name] 20 units QHS (reports tapered up dose following direction from PCP as fasting CBG >170 on average) . Current glucose readings:  o Reports has been monitoring home blood sugar, but not consistently keeping log recently o Fasting glucose readings: today: 205; yesterday: 250 o Reports CBGs have been elevated following steroid injection in his back on 1/13, but have slowly improved. - Patient verbalizes understanding of need to monitor closely and adjust back [name] dose accordingly as needed following instruction from PCP . Denies hypoglycemic/hyperglycemic symptoms . Reports has been working on  improving his diet, limiting carbohydrate portion sizes (cutting back on bread and sweets) . Reports has lost ~10 lbs over past month . Encourage patient to continue to monitor home blood sugar, restart keeping log of results and to contact office for readings outside established parameters or for new symptoms/concerns  Hypertension: . Controlled; current treatment: o Amlodipine 2.5 mg daily o Carvedilol 12.5 mg twice daily o Lisinopril 40 mg daily  . Denies monitoring home BP recently . Encourage patient to monitor home BP, bring readings to next visit, if persistently >140/90 or new symptoms notify office sooner  Patient Goals/Self-Care Activities . Over the next 90 days, patient will:  - check glucose, document, and provide at future appointments - check blood pressure, document, and provide at future appointments  Follow Up Plan: Telephone follow up appointment with care management team member scheduled for: 05/27/2020 at 8:30 am       The patient verbalized understanding of instructions, educational materials, and care plan provided today and declined offer to receive copy of patient instructions, educational materials, and care plan.   [name], PharmD, [facility] Clinical Pharmacist [facility] [phone]

## 2020-04-23 ENCOUNTER — Ambulatory Visit: Payer: Medicare HMO

## 2020-04-24 ENCOUNTER — Other Ambulatory Visit: Payer: Self-pay | Admitting: Family Medicine

## 2020-04-24 DIAGNOSIS — M545 Low back pain, unspecified: Secondary | ICD-10-CM | POA: Diagnosis not present

## 2020-04-24 DIAGNOSIS — J3089 Other allergic rhinitis: Secondary | ICD-10-CM

## 2020-04-28 ENCOUNTER — Ambulatory Visit: Payer: Medicare HMO

## 2020-04-30 ENCOUNTER — Ambulatory Visit: Payer: Medicare HMO

## 2020-05-05 ENCOUNTER — Ambulatory Visit: Payer: Medicare HMO

## 2020-05-07 ENCOUNTER — Ambulatory Visit: Payer: Medicare HMO

## 2020-05-12 ENCOUNTER — Ambulatory Visit: Payer: Medicare HMO

## 2020-05-13 ENCOUNTER — Ambulatory Visit: Payer: Self-pay | Admitting: Pharmacist

## 2020-05-13 DIAGNOSIS — E1169 Type 2 diabetes mellitus with other specified complication: Secondary | ICD-10-CM

## 2020-05-13 DIAGNOSIS — Z794 Long term (current) use of insulin: Secondary | ICD-10-CM

## 2020-05-13 NOTE — Patient Instructions (Signed)
Visit Information  PATIENT GOALS: Goals Addressed            This Visit's Progress   . Pharmacy - Patient Goals       Our goal A1c is less than 7%. This corresponds with fasting sugars less than 130 and 2 hour after meal sugars less than 180. Please check your blood sugar and keep a record of the results  Please check your blood pressure and keep record of the results  Our goal bad cholesterol, or LDL, is less than 70 . This is why it is important to continue taking your atorvastatin  Feel free to call me with any questions or concerns. I look forward to our next call on        The patient's wife verbalized understanding of instructions, educational materials, and care plan provided today and declined offer to receive copy of patient instructions, educational materials, and care plan.   Telephone follow up appointment with care management team member scheduled for: 05/29/2020 9:00 AM  [name], PharmD, [facility] Clinical Pharmacist [facility] [phone]

## 2020-05-13 NOTE — Chronic Care Management (AMB) (Signed)
Chronic Care Management Pharmacy Note  05/13/2020 Name:  [name] MRN:  [id number] DOB:  07/04/1950  Subjective: [name] is an 70 y.o. year old male who is a primary patient of [name], DO.  The CCM team was consulted for assistance with disease management and care coordination needs.    Receive a call from patient's wife requesting a call back.  Engaged with patient's wife by telephone for follow up visit in response to provider referral for pharmacy case management and/or care coordination services.   Consent to Services:  The patient was given information about Chronic Care Management services, agreed to services, and gave verbal consent prior to initiation of services.  Please see initial visit note for detailed documentation.   Objective:  Lab Results  Component Value Date   CREATININE 1.59 (H) 12/31/2019   CREATININE 1.65 (H) 12/30/2019   CREATININE 1.88 (H) 12/29/2019  Per shared record from [facility]: creatinine 1.45 mg/dL; eGFR ~49 on 01/23/20  Lab Results  Component Value Date   HGBA1C 8.7 (H) 12/27/2019    Assessment: Review of patient past medical history, allergies, medications, health status, including review of consultants reports, laboratory and other test data, was performed as part of comprehensive evaluation and provision of chronic care management services.   SDOH:  (Social Determinants of Health) assessments and interventions performed: none   CCM Care Plan  Allergies  Allergen Reactions  . Crestor [Rosuvastatin Calcium] Other (See Comments)    myalgia  . Invokana [Canagliflozin] Other (See Comments)    dehydrated  . Prednisone Other (See Comments)  . Sulfamethizole     Medications Reviewed Today    Reviewed by [name], MD (Physician) on 03/26/20 at 1154  Med List Status: <None>  Medication Order Taking? Sig Documenting Provider Last Dose Status Informant  ACCU-CHEK SMARTVIEW test strip  [id number] Yes USE 1 STRIP TO CHECK GLUCOSE TWICE DAILY Karamalegos, [name], DO Taking Active   acetaminophen (TYLENOL) 500 MG tablet [id number] Yes Take 1,000 mg by mouth every 8 (eight) hours as needed.  [provider] Taking Active Spouse/Significant Other  allopurinol (ZYLOPRIM) 100 MG tablet [id number] Yes Take 1 tablet (100 mg total) by mouth at bedtime. [name], DO Taking Active   amLODipine (NORVASC) 2.5 MG tablet [id number] Yes Take 1 tablet (2.5 mg total) by mouth daily. [name], DO Taking Active   aspirin EC 81 MG EC tablet [id number] Yes Take 1 tablet (81 mg total) by mouth daily. Swallow whole. [name], MD Taking Active   atorvastatin (LIPITOR) 40 MG tablet [id number] Yes Take 1 tablet (40 mg total) by mouth daily. [name], DO Taking Active   BYDUREON BCISE 2 MG/0.85ML Polo Riley [id number] Yes INJECT 2 MG SUBCUTANEOUSLY ONCE A WEEK  Patient taking differently: Inject 2 mg into the skin once a week.   [name], DO Taking Active Spouse/Significant Other           Med Note Harlow Asa A   Mon Jan 13, 2020  3:32 PM) On Thursdays  calcium carbonate (TUMS - DOSED IN MG ELEMENTAL CALCIUM) 500 MG chewable tablet [id number] Yes Chew 2 tablets by mouth at bedtime as needed for indigestion or heartburn.  [provider] Taking Active Spouse/Significant Other  carvedilol (COREG) 12.5 MG tablet [id number] Yes Take 1 tablet (12.5 mg total) by mouth 2 (two) times daily. [name], DO Taking Active   docusate sodium (COLACE) 100 MG capsule [id number] Yes  Take 100 mg by mouth 2 (two) times daily.  [provider] Taking Active Spouse/Significant Other  ezetimibe (ZETIA) 10 MG tablet [id number] Yes Take 1 tablet (10 mg total) by mouth daily. [name], DO Taking Active   fluticasone (FLONASE) 50 MCG/ACT nasal spray [id number] Yes USE 2 SPRAYS IN EACH NOSTRIL ONCE DAILY USE FOR 4  TO 6 WEEKS, THEN STOP AND USE SEASONALLY OR AS NEEDED. [name], DO Taking Active   gabapentin (NEURONTIN) 100 MG capsule [id number] Yes Start 1 capsule daily, increase by 1 cap every 2-3 days as tolerated up to 3 times a day, or may take 3 at once in evening. [name], DO Taking Active   Insulin Glargine [facility] KWIKPEN) 100 UNIT/ML [id number] Yes Inject 10 Units into the skin at bedtime.  Patient taking differently: Inject 14 Units into the skin at bedtime.   [name], MD Taking Active   Insulin Pen Needle (NOVOFINE PLUS) 32G X 4 MM MISC [id number] Yes Use with [name] insulin daily injection as instructed [name], DO Taking Active   lisinopril (ZESTRIL) 40 MG tablet [id number] Yes Take 1 tablet (40 mg total) by mouth daily. [name], DO Taking Active   nitroGLYCERIN (NITROSTAT) 0.4 MG SL tablet [id number] Yes Place 1 tablet (0.4 mg total) under the tongue every 5 (five) minutes as needed for chest pain. [name], MD Taking Active   omeprazole (PRILOSEC) 20 MG capsule [id number] Yes Take 1 capsule (20 mg total) by mouth daily before breakfast. [name], DO Taking Active   ticagrelor (BRILINTA) 90 MG TABS tablet [id number] Yes Take 1 tablet (90 mg total) by mouth 2 (two) times daily. [name], DO Taking Active           Patient Active Problem List   Diagnosis Date Noted  . Hx of gout   . Elevated PSA, less than 10 ng/ml 10/03/2017  . Lumbar radiculopathy 10/03/2017  . Drug-induced myopathy 10/03/2017  . Obesity (BMI 30.0-34.9) 10/03/2017  . History of pulmonary embolus (PE) 12/05/2016  . Chronic pain of left knee 10/07/2016  . CKD stage 3 due to type 2 diabetes mellitus ([place]) 10/07/2016  . Muscle strain 11/17/2015  . Low back pain 10/08/2015  . Hx of hematuria 07/16/2015  . Ganglion cyst of left foot 01/23/2015  . Smokeless tobacco use 01/12/2015  . Type 2 diabetes mellitus with  other specified complication ([place]) 97/41/6384  . Benign hypertension with CKD (chronic kidney disease) stage III ([place]) 10/16/2014  . Hyperlipidemia associated with type 2 diabetes mellitus ([place]) 10/16/2014  . Gout 10/16/2014  . GERD without esophagitis 10/16/2014  . Injury of tendon of upper extremity 05/29/2014  . Glenoid labral tear 05/29/2014  . Complete rotator cuff rupture of left shoulder 05/09/2014  . Infraspinatus tenosynovitis 05/09/2014  . Other synovitis and tenosynovitis, right shoulder 05/09/2014    Conditions to be addressed/monitored: HTN, HLD and DMII  Care Plan : PharmD - Medication Management  Updates made by Vella Raring, [place] since 05/13/2020 12:00 AM    Problem: Disease Progression     Long-Range Goal: Disease Progression Prevented or Minimized   Start Date: 04/22/2020  Expected End Date: 07/21/2020  Recent Progress: On track  Priority: High  Note:   Current Barriers:  . Financial Barriers - patient has [employer] and reports copay for Bydureon & insulin glargine is cost prohibitive at this time o Patient APPROVED for patient assistance for [employer] from [place] through  through 03/27/2021 o Patient APPROVED for patient assistance for Bydureon from [name] through 03/27/2021 . Lack of blood sugar or blood pressure readings for clinical team  Pharmacist Clinical Goal(s):  Marland Kitchen Over the next 90 days, patient will achieve adherence to monitoring guidelines and medication adherence to achieve therapeutic efficacy through collaboration with PharmD and provider.  .   Interventions: . 1:1 collaboration with [name], DO regarding development and update of comprehensive plan of care as evidenced by provider attestation and co-signature . Inter-disciplinary care team collaboration (see longitudinal plan of care)  Diabetes: . Current treatment: o Bydureon 2 mg weekly on Tuesdays o [name] - last reported 20 units QHS (adjusted according  to instruction from PCP, "if fasting CBG >170 on average go up by 1 unit weekly to around 20 units approx") . Receive message from patient's wife. Patient missed Bydureon last week as supply from assistance program had not yet come.  o Per notes from [facility] CPhT Susy Frizzle, she called [name] patient assistance program on behalf of patient on 2/10 and found that refill had not been shipped as previously discussed, but that medication would now be sent out expedited on 2/14. . Follow up with patient's wife today, who reports patient received Bydureon in the mail and restarted his weekly injection yesterday (Tuesday). o Confirm patient's wife has phone number for [name] patient assistance program . Will collaborate with PCP regarding patient's diabetes medication management o Note metformin discontinued during hospital admission 10/1-10/5 due to acute kidney injury with plan to follow up with outpatient Nephrology o Per shared record from [facility] ([place]) eGFR ~49 on 01/23/20 o Patient has upcoming appointment with Nephrology on 2/28  Patient Goals/Self-Care Activities . Over the next 90 days, patient will:  - check glucose, document, and provide at future appointments - check blood pressure, document, and provide at future appointments  Follow Up Plan: Telephone follow up appointment with care management team member scheduled for: 05/29/2020 9:00 AM     Follow Up:  Wife agrees to Care Plan and Follow-up.  Harlow Asa, PharmD, [place] [phone]

## 2020-05-14 ENCOUNTER — Ambulatory Visit: Payer: Medicare HMO

## 2020-05-19 ENCOUNTER — Ambulatory Visit: Payer: Medicare HMO

## 2020-05-21 ENCOUNTER — Ambulatory Visit: Payer: Medicare HMO

## 2020-05-25 DIAGNOSIS — N179 Acute kidney failure, unspecified: Secondary | ICD-10-CM | POA: Diagnosis not present

## 2020-05-25 DIAGNOSIS — E1122 Type 2 diabetes mellitus with diabetic chronic kidney disease: Secondary | ICD-10-CM | POA: Diagnosis not present

## 2020-05-25 DIAGNOSIS — I10 Essential (primary) hypertension: Secondary | ICD-10-CM | POA: Diagnosis not present

## 2020-05-25 DIAGNOSIS — N1831 Chronic kidney disease, stage 3a: Secondary | ICD-10-CM | POA: Diagnosis not present

## 2020-05-27 ENCOUNTER — Telehealth: Payer: Self-pay

## 2020-05-29 ENCOUNTER — Other Ambulatory Visit: Payer: Self-pay | Admitting: Family Medicine

## 2020-05-29 ENCOUNTER — Ambulatory Visit (INDEPENDENT_AMBULATORY_CARE_PROVIDER_SITE_OTHER): Payer: Medicare HMO | Admitting: Pharmacist

## 2020-05-29 DIAGNOSIS — E785 Hyperlipidemia, unspecified: Secondary | ICD-10-CM | POA: Diagnosis not present

## 2020-05-29 DIAGNOSIS — Z794 Long term (current) use of insulin: Secondary | ICD-10-CM | POA: Diagnosis not present

## 2020-05-29 DIAGNOSIS — I10 Essential (primary) hypertension: Secondary | ICD-10-CM

## 2020-05-29 DIAGNOSIS — E1122 Type 2 diabetes mellitus with diabetic chronic kidney disease: Secondary | ICD-10-CM | POA: Diagnosis not present

## 2020-05-29 DIAGNOSIS — E1169 Type 2 diabetes mellitus with other specified complication: Secondary | ICD-10-CM | POA: Diagnosis not present

## 2020-05-29 DIAGNOSIS — N183 Chronic kidney disease, stage 3 unspecified: Secondary | ICD-10-CM | POA: Diagnosis not present

## 2020-05-29 NOTE — Chronic Care Management (AMB) (Signed)
Chronic Care Management Pharmacy Note  05/29/2020 Name:  [name] MRN:  [id number] DOB:  1950/07/10  Subjective: [name] is an 70 y.o. year old male who is a primary patient of [name], DO.  The CCM team was consulted for assistance with disease management and care coordination needs.    Engaged with patient by telephone for follow up visit in response to provider referral for pharmacy case management and/or care coordination services.   Consent to Services:  The patient was given information about Chronic Care Management services, agreed to services, and gave verbal consent prior to initiation of services.  Please see initial visit note for detailed documentation.   Objective:  Lab Results  Component Value Date   CREATININE 1.59 (H) 12/31/2019   CREATININE 1.65 (H) 12/30/2019   CREATININE 1.88 (H) 12/29/2019  Per shared record from [facility]: creatinine 1.45 mg/dL; eGFR ~49 on 01/23/20 Per shared record from [facility]: creatinine 1.50 mg/dL; eGFR ~47 on 05/25/20  Lab Results  Component Value Date   HGBA1C 8.7 (H) 12/27/2019       Component Value Date/Time   CHOL 147 12/29/2019 0026   CHOL 212 (H) 11/12/2019 0829   TRIG 186 (H) 12/29/2019 0026   HDL 29 (L) 12/29/2019 0026   HDL 32 (L) 11/12/2019 0829   CHOLHDL 5.1 12/29/2019 0026   VLDL 37 12/29/2019 0026   LDLCALC 81 12/29/2019 0026   LDLCALC 139 (H) 11/12/2019 0829    BP Readings from Last 3 Encounters:  03/26/20 134/72  02/24/20 136/62  01/10/20 134/72    Assessment: Review of patient past medical history, allergies, medications, health status, including review of consultants reports, laboratory and other test data, was performed as part of comprehensive evaluation and provision of chronic care management services.   SDOH:  (Social Determinants of Health) assessments and interventions performed: yes   CCM Care Plan  Allergies  Allergen Reactions  . Crestor  [Rosuvastatin Calcium] Other (See Comments)    myalgia  . Invokana [Canagliflozin] Other (See Comments)    dehydrated  . Prednisone Other (See Comments)  . Sulfamethizole     Medications Reviewed Today    Reviewed by [name], MD (Physician) on 03/26/20 at 1154  Med List Status: <None>  Medication Order Taking? Sig Documenting Provider Last Dose Status Informant  ACCU-CHEK SMARTVIEW test strip [id number] Yes USE 1 STRIP TO CHECK GLUCOSE TWICE DAILY Karamalegos, [name], DO Taking Active   acetaminophen (TYLENOL) 500 MG tablet [id number] Yes Take 1,000 mg by mouth every 8 (eight) hours as needed.  [provider] Taking Active Spouse/Significant Other  allopurinol (ZYLOPRIM) 100 MG tablet [id number] Yes Take 1 tablet (100 mg total) by mouth at bedtime. [name], DO Taking Active   amLODipine (NORVASC) 2.5 MG tablet [id number] Yes Take 1 tablet (2.5 mg total) by mouth daily. [name], DO Taking Active   aspirin EC 81 MG EC tablet [id number] Yes Take 1 tablet (81 mg total) by mouth daily. Swallow whole. [name], MD Taking Active   atorvastatin (LIPITOR) 40 MG tablet [id number] Yes Take 1 tablet (40 mg total) by mouth daily. [name], DO Taking Active   BYDUREON BCISE 2 MG/0.85ML Polo Riley [id number] Yes INJECT 2 MG SUBCUTANEOUSLY ONCE A WEEK  Patient taking differently: Inject 2 mg into the skin once a week.   [name], DO Taking Active Spouse/Significant Other           Med Note (  Harlow Asa A   Mon Jan 13, 2020  3:32 PM) On Thursdays  calcium carbonate (TUMS - DOSED IN MG ELEMENTAL CALCIUM) 500 MG chewable tablet [id number] Yes Chew 2 tablets by mouth at bedtime as needed for indigestion or heartburn.  [provider] Taking Active Spouse/Significant Other  carvedilol (COREG) 12.5 MG tablet [id number] Yes Take 1 tablet (12.5 mg total) by mouth 2 (two) times daily. [name], DO Taking  Active   docusate sodium (COLACE) 100 MG capsule [id number] Yes Take 100 mg by mouth 2 (two) times daily.  [provider] Taking Active Spouse/Significant Other  ezetimibe (ZETIA) 10 MG tablet [id number] Yes Take 1 tablet (10 mg total) by mouth daily. [name], DO Taking Active   fluticasone (FLONASE) 50 MCG/ACT nasal spray [id number] Yes USE 2 SPRAYS IN EACH NOSTRIL ONCE DAILY USE FOR 4 TO 6 WEEKS, THEN STOP AND USE SEASONALLY OR AS NEEDED. [name], DO Taking Active   gabapentin (NEURONTIN) 100 MG capsule [id number] Yes Start 1 capsule daily, increase by 1 cap every 2-3 days as tolerated up to 3 times a day, or may take 3 at once in evening. [name], DO Taking Active   Insulin Glargine [facility] KWIKPEN) 100 UNIT/ML [id number] Yes Inject 10 Units into the skin at bedtime.  Patient taking differently: Inject 14 Units into the skin at bedtime.   [name], MD Taking Active   Insulin Pen Needle (NOVOFINE PLUS) 32G X 4 MM MISC [id number] Yes Use with [name] insulin daily injection as instructed [name], DO Taking Active   lisinopril (ZESTRIL) 40 MG tablet [id number] Yes Take 1 tablet (40 mg total) by mouth daily. [name], DO Taking Active   nitroGLYCERIN (NITROSTAT) 0.4 MG SL tablet [id number] Yes Place 1 tablet (0.4 mg total) under the tongue every 5 (five) minutes as needed for chest pain. [name], MD Taking Active   omeprazole (PRILOSEC) 20 MG capsule [id number] Yes Take 1 capsule (20 mg total) by mouth daily before breakfast. [name], DO Taking Active   ticagrelor (BRILINTA) 90 MG TABS tablet [id number] Yes Take 1 tablet (90 mg total) by mouth 2 (two) times daily. [name], DO Taking Active           Patient Active Problem List   Diagnosis Date Noted  . Hx of gout   . Elevated PSA, less than 10 ng/ml 10/03/2017  . Lumbar radiculopathy 10/03/2017  .  Drug-induced myopathy 10/03/2017  . Obesity (BMI 30.0-34.9) 10/03/2017  . History of pulmonary embolus (PE) 12/05/2016  . Chronic pain of left knee 10/07/2016  . CKD stage 3 due to type 2 diabetes mellitus ([place]) 10/07/2016  . Muscle strain 11/17/2015  . Low back pain 10/08/2015  . Hx of hematuria 07/16/2015  . Ganglion cyst of left foot 01/23/2015  . Smokeless tobacco use 01/12/2015  . Type 2 diabetes mellitus with other specified complication ([place]) 03/00/9233  . Benign hypertension with CKD (chronic kidney disease) stage III ([place]) 10/16/2014  . Hyperlipidemia associated with type 2 diabetes mellitus ([place]) 10/16/2014  . Gout 10/16/2014  . GERD without esophagitis 10/16/2014  . Injury of tendon of upper extremity 05/29/2014  . Glenoid labral tear 05/29/2014  . Complete rotator cuff rupture of left shoulder 05/09/2014  . Infraspinatus tenosynovitis 05/09/2014  . Other synovitis and tenosynovitis, right shoulder 05/09/2014    Conditions to be addressed/monitored: HTN, HLD, DMII and CKD  Care Plan : PharmD - Medication Management  Updates made by Vella Raring, [place] since 05/29/2020 12:00 AM    Problem: Disease Progression     Long-Range Goal: Disease Progression Prevented or Minimized   Start Date: 04/22/2020  Expected End Date: 07/21/2020  Recent Progress: On track  Priority: High  Note:   Current Barriers:  . Financial Barriers - patient has [employer] and reports copay for Bydureon & insulin glargine is cost prohibitive at this time o Patient APPROVED for patient assistance for [name] from [place] through through 03/27/2021 o Patient APPROVED for patient assistance for Bydureon from [name] through 03/27/2021 . Lack of blood sugar or blood pressure readings for clinical team  Pharmacist Clinical Goal(s):  Marland Kitchen Over the next 90 days, patient will achieve adherence to monitoring guidelines and medication adherence to achieve therapeutic efficacy through  collaboration with PharmD and provider.  .   Interventions: . 1:1 collaboration with [name], DO regarding development and update of comprehensive plan of care as evidenced by provider attestation and co-signature . Inter-disciplinary care team collaboration (see longitudinal plan of care) . Collaborated with PCP recommending plan to restart of patient's metformin provided kidney function remains stable at next scheduled Nephrology visit on 2/28 o Per shared record from [facility] eGFR ~49 on 01/23/20 . Perform chart review. Patient seen for follow up visit with Nephrology on 2/28 o Per shared record from [facility] eGFR ~47 on 05/25/20 o Per Dr. Candiss Norse: "Okay to start Metformin. Discussed with patient"  Diabetes: . Current treatment: o Bydureon 2 mg weekly on Tuesdays o [name] 20 units QHS  . Current glucose readings:  AM Fasting Bedtime Notes  27 - February - -   28 - February 247 -   1 - March - -   2 - March 210 -   3 - March - 265   4 - March 245*  *Had candy bar last night   . Consult with PCP via Secure chat. Provider agrees to plan for patient to: o Restart metformin ER 750 mg once daily for 1 week, then may increase to metformin ER 750 twice daily (provider adds to patient's medication list in chart) o Patient may reduce [name] dose by 1-2 units weekly for morning fasting blood sugar readings consistently <150 mg/dL . Provide above dosing instruction for metformin restart and instruction for [name] dosing adjustment from PCP to patient. Patient verbalizes understanding and agrees to plan. o Counsel Mr. Walthall to take metformin ER doses with meals and take consistently to reduce risk of GI upset o Mr. Panico reports having sufficient supply of metformin ER 750 mg at home for now, but will follow up with PCP at upcoming appointment on 4/1 if in need of new Rx at that time. . Have counseled patient on s/s of  hypoglycemia and how to manage low blood sugars . Counsel on importance of having regular well- balanced meals and limiting carbohydrate portion sizes. Mr. Ponciano reports that he has been working on limiting carbohydrate portion sizes . Reports has lost weight over past few months, currently ~221 lbs . Reports exercising throughout the day, staying busy working with his son  Encourage patient to continue to monitor home blood sugar, keep log of results to bring to upcoming appointment with PCP and to contact office for readings outside established parameters or for new symptoms/concerns  Hypertension: . Controlled; current treatment: ? Amlodipine 2.5 mg daily ? Carvedilol 12.5 mg twice daily ? Lisinopril 40 mg daily  .  Denies monitoring home BP recently. Reports not sure if blood pressure monitor is accurate and planning to bring monitor with him to upcoming PCP appointment to check . Note BP reading from Office Visit with Nephrology on 2/28: 115/71, HR 73  Hyperlipidemia: . Current treatment: o Atorvastatin 40 mg daily o Ezetimibe 10 mg daily . Counsel on importance of cholesterol control and dietary considerations to help improve cholesterol  Patient Goals/Self-Care Activities . Over the next 90 days, patient will:  - take medications as directed  Patient uses weekly pillbox to manage medications - check glucose, document, and provide at future appointments - check blood pressure, document, and provide at future appointments - attend medical appointments as scheduled  Next appointment with PCP on 4/1  Follow Up Plan: Telephone follow up appointment with care management team member scheduled for: 4/27 at 8:30 am      Follow Up:  Patient agrees to Care Plan and Follow-up.  Plan: Telephone follow up appointment with care management team member scheduled for:  4/27 at 8:30 am  Harlow Asa, PharmD, [place] [phone]

## 2020-05-29 NOTE — Patient Instructions (Signed)
Visit Information  PATIENT GOALS: Goals Addressed            This Visit's Progress   . Pharmacy - Patient Goals       Our goal A1c is less than 7%. This corresponds with fasting sugars less than 130 and 2 hour after meal sugars less than 180. Please check your blood sugar and keep a record of the results  Please check your blood pressure and keep record of the results  Our goal bad cholesterol, or LDL, is less than 70 . This is why it is important to continue taking your atorvastatin and ezetimibe  Feel free to call me with any questions or concerns. I look forward to our next call  [name], PharmD, BCACP Clinical Pharmacist [facility] [phone]         The patient verbalized understanding of instructions, educational materials, and care plan provided today and declined offer to receive copy of patient instructions, educational materials, and care plan.   Telephone follow up appointment with care management team member scheduled for: 4/27 at 8:30 am  [name], PharmD, [facility] Clinical Pharmacist [facility] [phone]

## 2020-06-04 ENCOUNTER — Other Ambulatory Visit: Payer: Self-pay

## 2020-06-05 ENCOUNTER — Ambulatory Visit: Payer: Self-pay | Admitting: Urology

## 2020-06-06 ENCOUNTER — Other Ambulatory Visit: Payer: Self-pay | Admitting: Family Medicine

## 2020-06-06 DIAGNOSIS — I252 Old myocardial infarction: Secondary | ICD-10-CM

## 2020-06-06 DIAGNOSIS — E1169 Type 2 diabetes mellitus with other specified complication: Secondary | ICD-10-CM

## 2020-06-09 ENCOUNTER — Other Ambulatory Visit: Payer: Self-pay

## 2020-06-09 DIAGNOSIS — E1169 Type 2 diabetes mellitus with other specified complication: Secondary | ICD-10-CM

## 2020-06-09 DIAGNOSIS — I252 Old myocardial infarction: Secondary | ICD-10-CM

## 2020-06-09 MED ORDER — ATORVASTATIN CALCIUM 40 MG PO TABS: 40.0000 mg | ORAL_TABLET | Freq: Every day | ORAL | 1 refills | Status: DC

## 2020-06-09 NOTE — Progress Notes (Signed)
Refill of Atorvastatin sent to [facility] pharmacy

## 2020-06-12 ENCOUNTER — Other Ambulatory Visit: Payer: Self-pay

## 2020-06-12 DIAGNOSIS — E1169 Type 2 diabetes mellitus with other specified complication: Secondary | ICD-10-CM

## 2020-06-12 DIAGNOSIS — I252 Old myocardial infarction: Secondary | ICD-10-CM

## 2020-06-12 MED ORDER — EZETIMIBE 10 MG PO TABS: 10.0000 mg | ORAL_TABLET | Freq: Every day | ORAL | 1 refills | Status: DC

## 2020-06-12 MED ORDER — TICAGRELOR 90 MG PO TABS: 90.0000 mg | ORAL_TABLET | Freq: Two times a day (BID) | ORAL | 1 refills | Status: DC

## 2020-06-26 ENCOUNTER — Ambulatory Visit: Payer: Medicare HMO | Admitting: Family Medicine

## 2020-07-06 ENCOUNTER — Other Ambulatory Visit: Payer: Self-pay | Admitting: Family Medicine

## 2020-07-06 DIAGNOSIS — J3089 Other allergic rhinitis: Secondary | ICD-10-CM

## 2020-07-10 ENCOUNTER — Other Ambulatory Visit: Payer: Self-pay | Admitting: Family Medicine

## 2020-07-10 ENCOUNTER — Ambulatory Visit (INDEPENDENT_AMBULATORY_CARE_PROVIDER_SITE_OTHER): Payer: Medicare HMO | Admitting: Family Medicine

## 2020-07-10 ENCOUNTER — Encounter: Payer: Self-pay | Admitting: Family Medicine

## 2020-07-10 ENCOUNTER — Other Ambulatory Visit: Payer: Self-pay

## 2020-07-10 VITALS — BP 118/52 | HR 73 | Temp 97.3°F | Resp 17 | Ht 71.0 in | Wt 226.8 lb

## 2020-07-10 DIAGNOSIS — Z0000 Encounter for general adult medical examination without abnormal findings: Secondary | ICD-10-CM

## 2020-07-10 DIAGNOSIS — E1122 Type 2 diabetes mellitus with diabetic chronic kidney disease: Secondary | ICD-10-CM | POA: Diagnosis not present

## 2020-07-10 DIAGNOSIS — I252 Old myocardial infarction: Secondary | ICD-10-CM

## 2020-07-10 DIAGNOSIS — E1169 Type 2 diabetes mellitus with other specified complication: Secondary | ICD-10-CM

## 2020-07-10 DIAGNOSIS — M1000 Idiopathic gout, unspecified site: Secondary | ICD-10-CM

## 2020-07-10 DIAGNOSIS — I129 Hypertensive chronic kidney disease with stage 1 through stage 4 chronic kidney disease, or unspecified chronic kidney disease: Secondary | ICD-10-CM

## 2020-07-10 DIAGNOSIS — N183 Chronic kidney disease, stage 3 unspecified: Secondary | ICD-10-CM

## 2020-07-10 DIAGNOSIS — Z794 Long term (current) use of insulin: Secondary | ICD-10-CM | POA: Diagnosis not present

## 2020-07-10 DIAGNOSIS — R972 Elevated prostate specific antigen [PSA]: Secondary | ICD-10-CM

## 2020-07-10 DIAGNOSIS — E785 Hyperlipidemia, unspecified: Secondary | ICD-10-CM

## 2020-07-10 DIAGNOSIS — G720 Drug-induced myopathy: Secondary | ICD-10-CM

## 2020-07-10 LAB — POCT GLYCOSYLATED HEMOGLOBIN (HGB A1C): Hemoglobin A1C: 10.1 % — AB (ref 4.0–5.6)

## 2020-07-10 MED ORDER — ATORVASTATIN CALCIUM 10 MG PO TABS: 10.0000 mg | ORAL_TABLET | Freq: Every day | ORAL | 3 refills | Status: DC

## 2020-07-10 NOTE — Assessment & Plan Note (Addendum)
Well-controlled HTN Complication with CKD-III Followed by [facility] Dr Thedore Mins   Plan:  1. Continue current BP regimen - Amlodipine 2.5mg  daily, Lisinopril 40mg  daily 2. Encourage improved lifestyle - low sodium diet, regular exercise improve as tolerated 3. Continue monitor BP outside office, bring readings to next visit, if persistently >140/90 or new symptoms notify office sooner

## 2020-07-10 NOTE — Assessment & Plan Note (Signed)
Uncontrolled cholesterol Side effect myopathy on Atorvastatin 40mg  Previous similar history of myopathy on other statin  REDUCE dose of Atorvastatin from 40 to 10mg  to limit side effect drug induced myopathy Continue Zetia  Encourage improved lifestyle - low carb/cholesterol, reduce portion size, continue improving regular exercise

## 2020-07-10 NOTE — Assessment & Plan Note (Signed)
Secondary to side effect on statin (failed rosuvastatin, crestor, lipitor in past) Failed Atorvastatin 40mg , will trial lower dose 10mg 

## 2020-07-10 NOTE — Assessment & Plan Note (Signed)
Stable CKD-III Secondary to age, Diabetes with hyperglycemia, HTN, possibly NSAID use  Plan: 1. Improve hydration, limit NSAIDs orally 2. Monitor Cr trend 3. Control DM see A&P

## 2020-07-10 NOTE — Progress Notes (Signed)
Subjective:    Patient ID: [name], male    DOB: 04-25-1950, 70 y.o.   MRN: [id number]  [name] is a 70 y.o. male presenting on 07/10/2020 for Diabetes (Pt discontinued the metformin because he felt it was causing severe nausea feeling x 2 weeks ) and Fatigue (Pt been state he's been feeling severely fatigue since starting on the statin medication x 6 mths )   HPI   CHRONIC DM, Type 2with nephropathy / CKD-III Due A1c today He admits increased appetite eating more still in evening, raises his sugar in AM. PM eating at night. CBGs: Avgapprox180 - 230s. Checks CBGs1-2 daily Self discontinued Metformin due to nausea. Meds: -CONTINUE Bydureon BCise 2mg  weekly inj - Continue Lantusback at20u daily (he had increased this) - OFF Metformin XR750 x 2daily - now has had resolved GI symptoms. Reports good compliance. Tolerating well w/o side-effects Currently on ACEi Lifestyle: - Diet (improved DM Diet, except now still increased sweets) - Exercise limited Due for DM Eye exam. Denies hypoglycemia, polyuria, visual changes, numbness or tingling.  HYPERLIPIDEMIA Drug Induced Myopathy - Reports concerns. Atorvastatin 40mg  daily causing myalgia muscle ache. He has been on it for 6 months following MI. He is on Zetia as well. He has had same symptom from statin myopathy in past. - STill taking Atorvastatin now  CHRONIC HTNCKD III Followed by [facility] Dr Current Meds -Lisinopril 40mg  daily, Amlodipine 2.5mg  daily Reports good compliance, took meds today. Tolerating well, w/o complaints. Denies CP, dyspnea, HA, edema, dizziness / lightheadedness   Depression screen [facility] 2/9 01/23/2020 01/21/2020 11/18/2019  Decreased Interest 0 0 0  Down, Depressed, Hopeless 0 0 0  PHQ - 2 Score 0 0 0  Altered sleeping 0 - -  Tired, decreased energy 1 - -  Change in appetite 0 - -  Feeling bad or failure about yourself  0 - -  Trouble concentrating 0 - -  Moving  slowly or fidgety/restless 0 - -  Suicidal thoughts 0 - -  PHQ-9 Score 1 - -  Difficult doing work/chores Not difficult at all - -    Social History   Tobacco Use  . Smoking status: Former Smoker    Types: Cigars    Quit date: 2000    Years since quitting: 22.3  . Smokeless tobacco: Current User    Types: Chew  . Tobacco comment: wife states patient never smoked cigarettes, just chewing tobacco  Vaping Use  . Vaping Use: Never used  Substance Use Topics  . Alcohol use: Not Currently    Alcohol/week: 0.0 standard drinks  . Drug use: No    Review of Systems Per HPI unless specifically indicated above     Objective:    BP (!) 118/52 (BP Location: Left Arm, Patient Position: Sitting, Cuff Size: Normal)   Pulse 73   Temp (!) 97.3 F (36.3 C) (Temporal)   Resp 17   Ht 5\' 11"  (1.803 m)   Wt 226 lb 12.8 oz (102.9 kg)   SpO2 98%   BMI 31.63 kg/m   Wt Readings from Last 3 Encounters:  07/10/20 226 lb 12.8 oz (102.9 kg)  03/26/20 228 lb 4 oz (103.5 kg)  02/24/20 232 lb (105.2 kg)    Physical Exam Vitals and nursing note reviewed.  Constitutional:      General: He is not in acute distress.    Appearance: He is well-developed. He is not diaphoretic.     Comments: Well-appearing, comfortable, cooperative  HENT:     Head: Normocephalic and atraumatic.  Eyes:     General:        Right eye: No discharge.        Left eye: No discharge.     Conjunctiva/sclera: Conjunctivae normal.  Neck:     Thyroid: No thyromegaly.  Cardiovascular:     Rate and Rhythm: Normal rate and regular rhythm.     Heart sounds: Normal heart sounds. No murmur heard.   Pulmonary:     Effort: Pulmonary effort is normal. No respiratory distress.     Breath sounds: Normal breath sounds. No wheezing or rales.  Musculoskeletal:        General: Normal range of motion.     Cervical back: Normal range of motion and neck supple.  Lymphadenopathy:     Cervical: No cervical adenopathy.  Skin:     General: Skin is warm and dry.     Findings: No erythema or rash.  Neurological:     Mental Status: He is alert and oriented to person, place, and time.  Psychiatric:        Behavior: Behavior normal.     Comments: Well groomed, good eye contact, normal speech and thoughts      Recent Labs    11/12/19 0829 12/27/19 1329 07/10/20 1007  HGBA1C 8.9* 8.7* 10.1*     Results for orders placed or performed in visit on 07/10/20  POCT glycosylated hemoglobin (Hb A1C)  Result Value Ref Range   Hemoglobin A1C 10.1 (A) 4.0 - 5.6 %      Assessment & Plan:   Problem List Items Addressed This Visit    Type 2 diabetes mellitus with other specified complication (HCC) - Primary    A1c elevated again to 10.1 from 8.7 Off metformin and poor lifestyle No hypoglycemia Complications - CKD-III nephropathy, other neuropathy from spine but less likely DM neuropathy, other including hyperlipidemia, GERD, obesity - increases risk of future cardiovascular complications  Off Tradjenta, duplicate therapy/cost, Invokana Failed Trulicity 0.75 and 1.5mg  weekly d/t GI intolerance OFF Metformin due to AKI - and GI intolerance  Plan:  1. CONTINUE Bydureon BCise 2mg  weekly inj 2. Increase [name] to 24 units, adjust if CBG fasting >150 by 1 unit weekly 3. Reconsider SGLT2 medication - however caution with kidney function currently 4 Encourage improved lifestyle - low carb, low sugar diet, reduce portion size, continue improving regular exercise 5. Check CBG, bring log to next visit for review 6. Continue ACEi      Relevant Medications   atorvastatin (LIPITOR) 10 MG tablet   Insulin Glargine ([name] KWIKPEN) 100 UNIT/ML   Other Relevant Orders   POCT glycosylated hemoglobin (Hb A1C) (Completed)   Hyperlipidemia associated with type 2 diabetes mellitus (HCC)    Uncontrolled cholesterol Side effect myopathy on Atorvastatin 40mg  Previous similar history of myopathy on other statin  REDUCE dose of  Atorvastatin from 40 to 10mg  to limit side effect drug induced myopathy Continue Zetia  Encourage improved lifestyle - low carb/cholesterol, reduce portion size, continue improving regular exercise      Relevant Medications   atorvastatin (LIPITOR) 10 MG tablet   Insulin Glargine ([name] KWIKPEN) 100 UNIT/ML   Drug-induced myopathy    Secondary to side effect on statin (failed rosuvastatin, crestor, lipitor in past) Failed Atorvastatin 40mg , will trial lower dose 10mg       CKD stage 3 due to type 2 diabetes mellitus (HCC)    Stable CKD-III Secondary to age, Diabetes with hyperglycemia,  HTN, possibly NSAID use  Plan: 1. Improve hydration, limit NSAIDs orally 2. Monitor Cr trend 3. Control DM see A&P      Relevant Medications   atorvastatin (LIPITOR) 10 MG tablet   Insulin Glargine ([name] KWIKPEN) 100 UNIT/ML   Benign hypertension with CKD (chronic kidney disease) stage III (HCC)    Well-controlled HTN Complication with CKD-III Followed by [facility] Dr Thedore Mins   Plan:  1. Continue current BP regimen - Amlodipine 2.5mg  daily, Lisinopril 40mg  daily 2. Encourage improved lifestyle - low sodium diet, regular exercise improve as tolerated 3. Continue monitor BP outside office, bring readings to next visit, if persistently >140/90 or new symptoms notify office sooner      Relevant Medications   atorvastatin (LIPITOR) 10 MG tablet    Other Visit Diagnoses    History of non-ST elevation myocardial infarction (NSTEMI)          Meds ordered this encounter  Medications  . atorvastatin (LIPITOR) 10 MG tablet    Sig: Take 1 tablet (10 mg total) by mouth daily.    Dispense:  90 tablet    Refill:  3    Dose reduced from 40 to 10mg       Follow up plan: Return in about 4 months (around 11/09/2020) for 4 month follow-up fasting lab only then 1 week later Annual Physical.  Future labs ordered for 10/2020   11/11/2020, DO [facility] 07/10/2020, 10:03 AM

## 2020-07-10 NOTE — Assessment & Plan Note (Signed)
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 CONTINUE Bydureon BCise 2mg  weekly inj 2. Increase [name] to 24 units, adjust if CBG fasting >150 by 1 unit weekly 3. Reconsider SGLT2 medication - however caution with kidney function currently 4 Encourage improved lifestyle - low carb, low sugar diet, reduce portion size, continue improving regular exercise 5. Check CBG, bring log to next visit for review 6. Continue ACEi

## 2020-07-10 NOTE — Patient Instructions (Addendum)
Thank you for coming to the office today.  Increase [name] insulin from 20 to 24 units. Then can increase by 1 unit every 5-7 days if fasting blood sugar >150 consistently in morning.  Stop Metformin  Stop Atorvastatin 40mg  for about 1-2 weeks until fatigue improves. Then RESTART NEW medicine Atorvastatin 10mg  daily - lower dose should work.  DUE for FASTING BLOOD WORK (no food or drink after midnight before the lab appointment, only water or coffee without cream/sugar on the morning of)  SCHEDULE "Lab Only" visit in the morning at the clinic for lab draw in 4 MONTHS   - Make sure Lab Only appointment is at about 1 week before your next appointment, so that results will be available  For Lab Results, once available within 2-3 days of blood draw, you can can log in to MyChart online to view your results and a brief explanation. Also, we can discuss results at next follow-up visit.    Please schedule a Follow-up Appointment to: Return in about 4 months (around 11/09/2020) for 4 month follow-up fasting lab only then 1 week later Annual Physical.  If you have any other questions or concerns, please feel free to call the office or send a message through MyChart. You may also schedule an earlier appointment if necessary.  Additionally, you may be receiving a survey about your experience at our office within a few days to 1 week by e-mail or mail. We value your feedback.  , DO [facility], Saralyn Pilar

## 2020-07-22 ENCOUNTER — Ambulatory Visit (INDEPENDENT_AMBULATORY_CARE_PROVIDER_SITE_OTHER): Payer: Medicare HMO | Admitting: Pharmacist

## 2020-07-22 DIAGNOSIS — E785 Hyperlipidemia, unspecified: Secondary | ICD-10-CM

## 2020-07-22 DIAGNOSIS — I129 Hypertensive chronic kidney disease with stage 1 through stage 4 chronic kidney disease, or unspecified chronic kidney disease: Secondary | ICD-10-CM | POA: Diagnosis not present

## 2020-07-22 DIAGNOSIS — N183 Chronic kidney disease, stage 3 unspecified: Secondary | ICD-10-CM | POA: Diagnosis not present

## 2020-07-22 DIAGNOSIS — Z794 Long term (current) use of insulin: Secondary | ICD-10-CM | POA: Diagnosis not present

## 2020-07-22 DIAGNOSIS — E1169 Type 2 diabetes mellitus with other specified complication: Secondary | ICD-10-CM

## 2020-07-22 NOTE — Patient Instructions (Signed)
Visit Information  PATIENT GOALS: Goals Addressed            This Visit's Progress   . Pharmacy - Patient Goals       Our goal A1c is less than 7%. This corresponds with fasting sugars less than 130 and 2 hour after meal sugars less than 180. Please check your blood sugar and keep a record of the results  Please check your blood pressure and keep record of the results  Our goal bad cholesterol, or LDL, is less than 70 . This is why it is important to continue taking your atorvastatin and ezetimibe  Feel free to call me with any questions or concerns. I look forward to our next call   [name], PharmD, BCACP Clinical Pharmacist [facility] [phone]         The patient verbalized understanding of instructions, educational materials, and care plan provided today and declined offer to receive copy of patient instructions, educational materials, and care plan.   Telephone follow up appointment with care management team member scheduled for: 6/6 at 8:30 am  [name], PharmD, Patsy [place], CPP Clinical Pharmacist [facility] [phone]

## 2020-07-22 NOTE — Chronic Care Management (AMB) (Signed)
Chronic Care Management Pharmacy Note  07/22/2020 Name:  [name] MRN:  [id number] DOB:  06/22/1950  Subjective: [name] is an 70 y.o. year old male who is a primary patient of [name], DO.  The CCM team was consulted for assistance with disease management and care coordination needs.    Engaged with patient by telephone for follow up visit in response to provider referral for pharmacy case management and/or care coordination services.   Consent to Services:  The patient was given information about Chronic Care Management services, agreed to services, and gave verbal consent prior to initiation of services.  Please see initial visit note for detailed documentation.   Patient Care Team: [name], DO as PCP - General (Family Medicine) [name], MD as PCP - Cardiology (Cardiology) Vella Raring, RPH-CPP as Pharmacist  Recent office visits: Office Visit with PCP on 4/15  Hospital visits: None in previous 6 months  Objective:  Lab Results  Component Value Date   CREATININE 1.59 (H) 12/31/2019   CREATININE 1.65 (H) 12/30/2019   CREATININE 1.88 (H) 12/29/2019    Lab Results  Component Value Date   HGBA1C 10.1 (A) 07/10/2020   Last diabetic Eye exam:  Lab Results  Component Value Date/Time   HMDIABEYEEXA No Retinopathy 03/17/2020 12:00 AM    Last diabetic Foot exam: No results found for: HMDIABFOOTEX      Component Value Date/Time   CHOL 147 12/29/2019 0026   CHOL 212 (H) 11/12/2019 0829   TRIG 186 (H) 12/29/2019 0026   HDL 29 (L) 12/29/2019 0026   HDL 32 (L) 11/12/2019 0829   CHOLHDL 5.1 12/29/2019 0026   VLDL 37 12/29/2019 0026   LDLCALC 81 12/29/2019 0026   LDLCALC 139 (H) 11/12/2019 0829    Hepatic Function Latest Ref Rng & Units 12/27/2019 11/12/2019 01/11/2019  Total Protein 6.5 - 8.1 g/dL 8.5(H) 7.1 -  Albumin 3.5 - 5.0 g/dL 4.3 4.1 4.0  AST 15 - 41 U/L 25 18 -  ALT 0 - 44 U/L 19 16 -   Alk Phosphatase 38 - 126 U/L 58 77 -  Total Bilirubin 0.3 - 1.2 mg/dL 0.6 0.3 -  Bilirubin, Direct 0.0 - 0.2 mg/dL <0.1 - -    Social History   Tobacco Use  Smoking Status Former Smoker  . Types: Cigars  . Quit date: 2000  . Years since quitting: 22.3  Smokeless Tobacco Current User  . Types: Chew  Tobacco Comment   wife states patient never smoked cigarettes, just chewing tobacco   BP Readings from Last 3 Encounters:  07/10/20 (!) 118/52  03/26/20 134/72  02/24/20 136/62   Pulse Readings from Last 3 Encounters:  07/10/20 73  03/26/20 79  02/24/20 72   Wt Readings from Last 3 Encounters:  07/10/20 226 lb 12.8 oz (102.9 kg)  03/26/20 228 lb 4 oz (103.5 kg)  02/24/20 232 lb (105.2 kg)    Assessment: Review of patient past medical history, allergies, medications, health status, including review of consultants reports, laboratory and other test data, was performed as part of comprehensive evaluation and provision of chronic care management services.   SDOH:  (Social Determinants of Health) assessments and interventions performed: none   CCM Care Plan  Allergies  Allergen Reactions  . Crestor [Rosuvastatin Calcium] Other (See Comments)    myalgia  . Invokana [Canagliflozin] Other (See Comments)    dehydrated  . Prednisone Other (See Comments)  . Sulfamethizole  Medications Reviewed Today    Reviewed by Vella Raring, RPH-CPP (Pharmacist) on 07/22/20 at 0847  Med List Status: <None>  Medication Order Taking? Sig Documenting Provider Last Dose Status Informant  ACCU-CHEK SMARTVIEW test strip [id number]  USE 1 STRIP TO CHECK GLUCOSE TWICE DAILY Karamalegos, [name], DO  Active   acetaminophen (TYLENOL) 500 MG tablet [id number] Yes Take 1,000 mg by mouth every 8 (eight) hours as needed.  [provider] Taking Active Spouse/Significant Other  allopurinol (ZYLOPRIM) 100 MG tablet [id number]  Take 1 tablet (100 mg total) by mouth at bedtime.  [name], DO  Active   amLODipine (NORVASC) 2.5 MG tablet [id number] Yes Take 1 tablet (2.5 mg total) by mouth daily. [name], DO Taking Active   aspirin EC 81 MG EC tablet [id number] Yes Take 1 tablet (81 mg total) by mouth daily. Swallow whole. [name], MD Taking Active   atorvastatin (LIPITOR) 10 MG tablet [id number] No Take 1 tablet (10 mg total) by mouth daily.  Patient not taking: Reported on 07/22/2020   [name], DO Not Taking Active   BYDUREON BCISE 2 MG/0.85ML [place] [id number] Yes INJECT 2 MG SUBCUTANEOUSLY ONCE A WEEK  Patient taking differently: Inject 2 mg into the skin once a week.   [name], DO Taking Active            Med Note [facility], Rigby Swamy A   Fri May 29, 2020  9:07 AM) On Tuesdays  calcium carbonate (TUMS - DOSED IN MG ELEMENTAL CALCIUM) 500 MG chewable tablet [id number] No Chew 2 tablets by mouth at bedtime as needed for indigestion or heartburn.   Patient not taking: Reported on 07/22/2020   [provider] Not Taking Active Spouse/Significant Other  carvedilol (COREG) 12.5 MG tablet [id number] Yes Take 1 tablet (12.5 mg total) by mouth 2 (two) times daily. [name], DO Taking Active   ezetimibe (ZETIA) 10 MG tablet [id number] Yes Take 1 tablet (10 mg total) by mouth daily. [name], DO Taking Active   fluticasone (FLONASE) 50 MCG/ACT nasal spray [id number] Yes USE 2 SPRAYS IN EACH NOSTRIL ONCE DAILY USE FOR 4 TO 6 WEEKS, THEN STOP AND USE SEASONALLY OR AS NEEDED. [name], DO Taking Active   gabapentin (NEURONTIN) 100 MG capsule [id number] No Start 1 capsule daily, increase by 1 cap every 2-3 days as tolerated up to 3 times a day, or may take 3 at once in evening.  Patient not taking: Reported on 07/22/2020   [name], DO Not Taking Active   Insulin Glargine [facility]) 100 UNIT/ML [id number] Yes Inject 24 Units into the skin at  bedtime. [name], DO Taking Active   Insulin Pen Needle (NOVOFINE PLUS) 32G X 4 MM MISC [id number]  Use with [name] insulin daily injection as instructed [name], DO  Active   lisinopril (ZESTRIL) 40 MG tablet [id number] Yes Take 1 tablet (40 mg total) by mouth daily. [name], DO Taking Active   nitroGLYCERIN (NITROSTAT) 0.4 MG SL tablet [id number] No Place 1 tablet (0.4 mg total) under the tongue every 5 (five) minutes as needed for chest pain.  Patient not taking: Reported on 07/22/2020   [name], MD Not Taking Active   omeprazole (PRILOSEC) 20 MG capsule [id number] Yes Take 1 capsule (20 mg total) by mouth daily before breakfast. [name], DO Taking Active   ticagrelor (BRILINTA) 90 MG TABS tablet [id number] Yes Take 1  tablet (90 mg total) by mouth 2 (two) times daily. [name], DO Taking Active           Patient Active Problem List   Diagnosis Date Noted  . Hx of gout   . Elevated PSA, less than 10 ng/ml 10/03/2017  . Lumbar radiculopathy 10/03/2017  . Drug-induced myopathy 10/03/2017  . Obesity (BMI 30.0-34.9) 10/03/2017  . History of pulmonary embolus (PE) 12/05/2016  . Chronic pain of left knee 10/07/2016  . CKD stage 3 due to type 2 diabetes mellitus ([place]) 10/07/2016  . Muscle strain 11/17/2015  . Low back pain 10/08/2015  . Hx of hematuria 07/16/2015  . Ganglion cyst of left foot 01/23/2015  . Smokeless tobacco use 01/12/2015  . Type 2 diabetes mellitus with other specified complication ([place]) 63/84/5364  . Benign hypertension with CKD (chronic kidney disease) stage III ([place]) 10/16/2014  . Hyperlipidemia associated with type 2 diabetes mellitus ([place]) 10/16/2014  . Gout 10/16/2014  . GERD without esophagitis 10/16/2014  . Injury of tendon of upper extremity 05/29/2014  . Glenoid labral tear 05/29/2014  . Complete rotator cuff rupture of left shoulder 05/09/2014  . Infraspinatus  tenosynovitis 05/09/2014  . Other synovitis and tenosynovitis, right shoulder 05/09/2014    Immunization History  Administered Date(s) Administered  . PFIZER(Purple Top)SARS-COV-2 Vaccination 05/15/2019, 06/05/2019  . Tdap 07/25/2016    Conditions to be addressed/monitored: HTN, HLD and DMII, CKD  Care Plan : PharmD - Medication Management  Updates made by Vella Raring, RPH-CPP since 07/22/2020 12:00 AM    Problem: Disease Progression     Long-Range Goal: Disease Progression Prevented or Minimized   Start Date: 04/22/2020  Expected End Date: 07/21/2020  Recent Progress: On track  Priority: High  Note:   Current Barriers:  . Financial Barriers - patient has [employer] and reports copay for Bydureon & insulin glargine is cost prohibitive at this time o Patient APPROVED for patient assistance for [name] from [place] through through 03/27/2021 o Patient APPROVED for patient assistance for Bydureon from [name] through 03/27/2021 . Lack of blood sugar or blood pressure readings for clinical team  Pharmacist Clinical Goal(s):  Marland Kitchen Over the next 90 days, patient will achieve adherence to monitoring guidelines and medication adherence to achieve therapeutic efficacy through collaboration with PharmD and provider.   Interventions: . 1:1 collaboration with [name], DO regarding development and update of comprehensive plan of care as evidenced by provider attestation and co-signature . Inter-disciplinary care team collaboration (see longitudinal plan of care) . Perform chart review. Patient seen by PCP on 4/15 for Office Visit o Patient reported stopped metformin due to side effects (severe nausea) o Provider advised patient to:  - CONTINUE Bydureon BCise 52m weekly - Increase [name] to 24 units, adjust if CBG fasting >150 by 1 unit weekly - REDUCE dose of Atorvastatin from 40 to 156mto limit side effect drug induced myopathy - Continue  Zetia  Diabetes: . Uncontrolled; current treatment: o Bydureon 2 mg weekly on Tuesdays o [name] 24 units QHS  . Current glucose readings:  AM Fasting Notes  20 - April 246* *Ice cream night before  21 - April    22 - April    23 - April    24 - April    25 - April 223   26 - April 205   27 - April 191    . Advise patient to increase [name] dose from 24 to 25 units daily based on  fasting CBG readings. Advise patient to continue to monitor fasting CBG readings and adjust [name] dose, as directed by PCP by 1 unit weekly if fasting readings consistently >150 . Current meal patterns: breakfast: eggs, lunchmeat and <1/2 biscuit; lunch: lunch plate: hamburger steak or porkchop and green beans; supper: chicken or BBQ with beans; drinks: diet cola . Counsel on importance of having regular well- balanced meals and limiting carbohydrate portion sizes.   Hypertension: . Controlled; current treatment: ? Amlodipine 2.5 mg daily ? Carvedilol 12.5 mg twice daily ? Lisinopril 40 mg daily  . Denies monitoring home BP recently.  . Counsel patient to monitor home blood pressure, keep log of results and bring record to medical appointments  Hyperlipidemia: . Current treatment: o Ezetimibe 10 mg daily o Stopped atorvastatin (reports taking 1-2 week off of medication before resuming reduced dose from PCP) . Counsel patient to start reduced dose of atorvastatin 10 mg daily . Counsel on importance of cholesterol control and dietary considerations to help improve cholesterol and counsel to review nutrition labels  Patient Goals/Self-Care Activities . Over the next 90 days, patient will:  - take medications as directed  Patient uses weekly pillbox to manage medications - check glucose, document, and provide at future appointments - check blood pressure, document, and provide at future appointments - attend medical appointments as scheduled  Follow Up Plan: Telephone follow up appointment with  care management team member scheduled for: 6/6 at 8:30 am      Medication Assistance:see above  Patient's preferred pharmacy is:  [facility] [address], [place] - [place] [address] Phone: [phone] Fax: [facility] [place], [place] [address] Phone: [phone] Fax: [phone]  Uses pill box? Yes  Follow Up:  Patient agrees to Care Plan and Follow-up.  Plan: Telephone follow up appointment with care management team member scheduled for:  6/6 at 8:30 am  Harlow Asa, PharmD, [place], [place] [phone]

## 2020-08-31 ENCOUNTER — Ambulatory Visit (INDEPENDENT_AMBULATORY_CARE_PROVIDER_SITE_OTHER): Payer: Medicare HMO | Admitting: Pharmacist

## 2020-08-31 DIAGNOSIS — N183 Chronic kidney disease, stage 3 unspecified: Secondary | ICD-10-CM

## 2020-08-31 DIAGNOSIS — E1169 Type 2 diabetes mellitus with other specified complication: Secondary | ICD-10-CM

## 2020-08-31 DIAGNOSIS — I129 Hypertensive chronic kidney disease with stage 1 through stage 4 chronic kidney disease, or unspecified chronic kidney disease: Secondary | ICD-10-CM | POA: Diagnosis not present

## 2020-08-31 DIAGNOSIS — E785 Hyperlipidemia, unspecified: Secondary | ICD-10-CM

## 2020-08-31 DIAGNOSIS — Z794 Long term (current) use of insulin: Secondary | ICD-10-CM

## 2020-08-31 NOTE — Patient Instructions (Signed)
Visit Information  PATIENT GOALS: Goals Addressed            This Visit's Progress   . Pharmacy - Patient Goals       Our goal A1c is less than 7%. This corresponds with fasting sugars less than 130 and 2 hour after meal sugars less than 180. Please check your blood sugar and keep a record of the results  Please check your blood pressure and keep record of the results  Our goal bad cholesterol, or LDL, is less than 70 . This is why it is important to continue taking your atorvastatin and ezetimibe  Feel free to call me with any questions or concerns. I look forward to our next call    [name], PharmD, BCACP Clinical Pharmacist [facility] [phone]         The patient verbalized understanding of instructions, educational materials, and care plan provided today and declined offer to receive copy of patient instructions, educational materials, and care plan.   Telephone follow up appointment with care management team member scheduled for: 7/13 at 8:30 am

## 2020-08-31 NOTE — Chronic Care Management (AMB) (Signed)
Chronic Care Management Pharmacy Note  08/31/2020 Name:  [name] MRN:  [id number] DOB:  1950/08/20   Subjective: [name] is an 70 y.o. year old male who is a primary patient of [name], DO.  The CCM team was consulted for assistance with disease management and care coordination needs.    Engaged with patient by telephone for follow up visit in response to provider referral for pharmacy case management and/or care coordination services.   Consent to Services:  The patient was given information about Chronic Care Management services, agreed to services, and gave verbal consent prior to initiation of services.  Please see initial visit note for detailed documentation.   Patient Care Team: [name], DO as PCP - General (Family Medicine) [name], MD as PCP - Cardiology (Cardiology) Winfield Cunas Virl Diamond, RPH-CPP as Pharmacist  Recent office visits: None  Hospital visits: None in previous 6 months  Objective:  Lab Results  Component Value Date   CREATININE 1.59 (H) 12/31/2019   CREATININE 1.65 (H) 12/30/2019   CREATININE 1.88 (H) 12/29/2019    Lab Results  Component Value Date   HGBA1C 10.1 (A) 07/10/2020   Last diabetic Eye exam:  Lab Results  Component Value Date/Time   HMDIABEYEEXA No Retinopathy 03/17/2020 12:00 AM    Last diabetic Foot exam: No results found for: HMDIABFOOTEX      Component Value Date/Time   CHOL 147 12/29/2019 0026   CHOL 212 (H) 11/12/2019 0829   TRIG 186 (H) 12/29/2019 0026   HDL 29 (L) 12/29/2019 0026   HDL 32 (L) 11/12/2019 0829   CHOLHDL 5.1 12/29/2019 0026   VLDL 37 12/29/2019 0026   LDLCALC 81 12/29/2019 0026   LDLCALC 139 (H) 11/12/2019 0829    Hepatic Function Latest Ref Rng & Units 12/27/2019 11/12/2019 01/11/2019  Total Protein 6.5 - 8.1 g/dL 8.5(H) 7.1 -  Albumin 3.5 - 5.0 g/dL 4.3 4.1 4.0  AST 15 - 41 U/L 25 18 -  ALT 0 - 44 U/L 19 16 -  Alk Phosphatase 38 - 126  U/L 58 77 -  Total Bilirubin 0.3 - 1.2 mg/dL 0.6 0.3 -  Bilirubin, Direct 0.0 - 0.2 mg/dL <0.1 - -   Clinical ASCVD: Yes  The ASCVD Risk score Mikey Bussing DC Jr., et al., 2013) failed to calculate for the following reasons:   The patient has a prior MI or stroke diagnosis      Social History   Tobacco Use  Smoking Status Former Smoker  . Types: Cigars  . Quit date: 2000  . Years since quitting: 22.4  Smokeless Tobacco Current User  . Types: Chew  Tobacco Comment   wife states patient never smoked cigarettes, just chewing tobacco   BP Readings from Last 3 Encounters:  07/10/20 (!) 118/52  03/26/20 134/72  02/24/20 136/62   Pulse Readings from Last 3 Encounters:  07/10/20 73  03/26/20 79  02/24/20 72   Wt Readings from Last 3 Encounters:  07/10/20 226 lb 12.8 oz (102.9 kg)  03/26/20 228 lb 4 oz (103.5 kg)  02/24/20 232 lb (105.2 kg)    Assessment: Review of patient past medical history, allergies, medications, health status, including review of consultants reports, laboratory and other test data, was performed as part of comprehensive evaluation and provision of chronic care management services.   SDOH:  (Social Determinants of Health) assessments and interventions performed: none   CCM Care Plan  Allergies  Allergen Reactions  .  Crestor [Rosuvastatin Calcium] Other (See Comments)    myalgia  . Invokana [Canagliflozin] Other (See Comments)    dehydrated  . Prednisone Other (See Comments)  . Sulfamethizole     Medications Reviewed Today    Reviewed by Vella Raring, RPH-CPP (Pharmacist) on 08/31/20 at [facility] List Status: <None>  Medication Order Taking? Sig Documenting Provider Last Dose Status Informant  ACCU-CHEK SMARTVIEW test strip [id number]  USE 1 STRIP TO CHECK GLUCOSE TWICE DAILY Karamalegos, [name], DO  Active   acetaminophen (TYLENOL) 500 MG tablet [id number]  Take 1,000 mg by mouth every 8 (eight) hours as needed.  [provider]   Active Spouse/Significant Other  allopurinol (ZYLOPRIM) 100 MG tablet [id number] Yes Take 1 tablet (100 mg total) by mouth at bedtime. [name], DO Taking Active   amLODipine (NORVASC) 2.5 MG tablet [id number] Yes Take 1 tablet (2.5 mg total) by mouth daily. [name], DO Taking Active   aspirin EC 81 MG EC tablet [id number] Yes Take 1 tablet (81 mg total) by mouth daily. Swallow whole. [name], MD Taking Active   atorvastatin (LIPITOR) 10 MG tablet [id number] Yes Take 1 tablet (10 mg total) by mouth daily. [name], DO Taking Active   BYDUREON BCISE 2 MG/0.85ML [place]o Riley [id number] Yes INJECT 2 MG SUBCUTANEOUSLY ONCE A WEEK  Patient taking differently: Inject 2 mg into the skin once a week.   [name], DO Taking Active            Med Note [facility], Chalise Pe A   Fri May 29, 2020  9:07 AM) On Tuesdays  calcium carbonate (TUMS - DOSED IN MG ELEMENTAL CALCIUM) 500 MG chewable tablet [id number]  Chew 2 tablets by mouth at bedtime as needed for indigestion or heartburn.   Patient not taking: Reported on 07/22/2020   [provider]  Active Spouse/Significant Other  carvedilol (COREG) 12.5 MG tablet [id number] Yes Take 1 tablet (12.5 mg total) by mouth 2 (two) times daily. [name], DO Taking Active   ezetimibe (ZETIA) 10 MG tablet [id number] Yes Take 1 tablet (10 mg total) by mouth daily. [name], DO Taking Active   fluticasone (FLONASE) 50 MCG/ACT nasal spray [id number]  USE 2 SPRAYS IN EACH NOSTRIL ONCE DAILY USE FOR 4 TO 6 WEEKS, THEN STOP AND USE SEASONALLY OR AS NEEDED. Karamalegos, [name], DO  Active   gabapentin (NEURONTIN) 100 MG capsule [id number]  Start 1 capsule daily, increase by 1 cap every 2-3 days as tolerated up to 3 times a day, or may take 3 at once in evening.  Patient not taking: Reported on 07/22/2020   [name], DO  Active   Insulin Glargine [facility]) 100  UNIT/ML [id number] Yes Inject 28 Units into the skin at bedtime. [name], DO Taking Active   Insulin Pen Needle (NOVOFINE PLUS) 32G X 4 MM MISC [id number]  Use with [name] insulin daily injection as instructed [name], DO  Active   lisinopril (ZESTRIL) 40 MG tablet [id number] Yes Take 1 tablet (40 mg total) by mouth daily. [name], DO Taking Active   nitroGLYCERIN (NITROSTAT) 0.4 MG SL tablet [id number]  Place 1 tablet (0.4 mg total) under the tongue every 5 (five) minutes as needed for chest pain.  Patient not taking: Reported on 07/22/2020   [name], MD  Active   omeprazole (PRILOSEC) 20 MG capsule [id number]  Take 1 capsule (20 mg total) by mouth  daily before breakfast. [name], DO  Active   ticagrelor (BRILINTA) 90 MG TABS tablet [id number] Yes Take 1 tablet (90 mg total) by mouth 2 (two) times daily. [name], DO Taking Active           Patient Active Problem List   Diagnosis Date Noted  . Hx of gout   . Elevated PSA, less than 10 ng/ml 10/03/2017  . Lumbar radiculopathy 10/03/2017  . Drug-induced myopathy 10/03/2017  . Obesity (BMI 30.0-34.9) 10/03/2017  . History of pulmonary embolus (PE) 12/05/2016  . Chronic pain of left knee 10/07/2016  . CKD stage 3 due to type 2 diabetes mellitus ([place]) 10/07/2016  . Muscle strain 11/17/2015  . Low back pain 10/08/2015  . Hx of hematuria 07/16/2015  . Ganglion cyst of left foot 01/23/2015  . Smokeless tobacco use 01/12/2015  . Type 2 diabetes mellitus with other specified complication ([place]) 95/28/4132  . Benign hypertension with CKD (chronic kidney disease) stage III ([place]) 10/16/2014  . Hyperlipidemia associated with type 2 diabetes mellitus ([place]) 10/16/2014  . Gout 10/16/2014  . GERD without esophagitis 10/16/2014  . Injury of tendon of upper extremity 05/29/2014  . Glenoid labral tear 05/29/2014  . Complete rotator cuff rupture of left shoulder  05/09/2014  . Infraspinatus tenosynovitis 05/09/2014  . Other synovitis and tenosynovitis, right shoulder 05/09/2014    Immunization History  Administered Date(s) Administered  . PFIZER(Purple Top)SARS-COV-2 Vaccination 05/15/2019, 06/05/2019  . Tdap 07/25/2016    Conditions to be addressed/monitored: HTN, HLD and DMII, CKD  Care Plan : PharmD - Medication Management  Updates made by Vella Raring, RPH-CPP since 08/31/2020 12:00 AM    Problem: Disease Progression     Long-Range Goal: Disease Progression Prevented or Minimized   Start Date: 04/22/2020  Expected End Date: 07/21/2020  This Visit's Progress: On track  Recent Progress: On track  Priority: High  Note:   Current Barriers:  . Financial Barriers - patient has [employer] and reports copay for Bydureon & insulin glargine is cost prohibitive at this time o Patient APPROVED for patient assistance for [name] from [place] through through 03/27/2021 o Patient APPROVED for patient assistance for Bydureon from [name] through 03/27/2021 . Lack of blood sugar or blood pressure readings for clinical team  Pharmacist Clinical Goal(s):  Marland Kitchen Over the next 90 days, patient will achieve adherence to monitoring guidelines and medication adherence to achieve therapeutic efficacy through collaboration with PharmD and provider.   Interventions: . 1:1 collaboration with [name], DO regarding development and update of comprehensive plan of care as evidenced by provider attestation and co-signature . Inter-disciplinary care team collaboration (see longitudinal plan of care)  Diabetes: . Uncontrolled; current treatment: o Bydureon 2 mg weekly o [name] 28 units QHS  - Reports increased dose gradually over past month following direction from PCP . Recent fasting glucose readings: o Today: 190 (reports had birthday cake last night) o 5/30: 166 . Denies checking fasting blood sugar daily recently. . Denies  s/s of hypoglycemia . Advise patient to restart monitoring fasting CBG readings daily and to adjust [name] dose, as directed by PCP by 1 unit weekly if fasting readings consistently >150 . Reports has been working on trying to reduce his portion sizes, particularly in the evenings . Discuss impact of diet, exercise and weight on blood sugar control o Reports has been exercising most days, staying busy working with son as well as walking  Hypertension: . Controlled; current treatment: ?  Amlodipine 2.5 mg daily ? Carvedilol 12.5 mg twice daily ? Lisinopril 40 mg daily  . Checks BP today while on phone: 131/76, HR 63 . Counsel on BP monitoring technique . Encourage patient to start monitor home blood pressure, keep log of results and bring record to upcoming appointment with Cardiologist . Counsel on importance of medication adherence and using weekly pillbox  Hyperlipidemia: . Current treatment: o Ezetimibe 10 mg daily o Atorvastatin 10 mg daily . Previous therapies tried: atorvastatin 40 mg daily (unable to tolerate due to muscle pain) . Have counseled on importance of cholesterol control and dietary considerations to help improve cholesterol and counsel to review nutrition labels . Counsel on importance of medication adherence  Medication Assistance: . Reports cost of Brilinta has become difficult to afford.  o Reports has a current supply as recenlty received 90 day supply Rx o Note patient currently approved for [name] patient assistance program . Place phone call to [name] patient assistance program today. Speak with representative who advises patient will need to complete a new application for Brilinta . Will collaborate with [facility] CPhT Susy Frizzle for assistance to patient for Brilinta patient assistance program  Patient Goals/Self-Care Activities . Over the next 90 days, patient will:  - take medications as directed  Patient uses weekly pillbox to manage medications - check  glucose, document, and provide at future appointments - check blood pressure, document, and provide at future appointments - attend medical appointments as scheduled  Follow Up Plan: Telephone follow up appointment with care management team member scheduled for: 7/13 at 8:30 am       Patient's preferred pharmacy is:  [facility] [address], [place] - [place] [address] Phone: [phone] Fax: [facility] [place], [place] [address] Phone: [phone] Fax: [phone]  Uses pill box? Yes  Follow Up:  Patient agrees to Care Plan and Follow-up.  Harlow Asa, PharmD, Para [place], CPP Clinical Pharmacist [facility] [phone]

## 2020-09-01 ENCOUNTER — Telehealth: Payer: Self-pay | Admitting: Pharmacy Technician

## 2020-09-01 DIAGNOSIS — Z596 Low income: Secondary | ICD-10-CM

## 2020-09-01 NOTE — Progress Notes (Signed)
[facility] Customer service manager [facility])                                            [facility] Quality Pharmacy Team    09/01/2020  [name] 1950/08/12 [id number]                                       Medication Assistance Referral  Referral From: [facility] Embedded RPh Vallery Sa   Medication/Company: [employer] / [name] Patient application portion:  Mailed Provider application portion: Faxed  to Dr. Saralyn Pilar Provider address/fax verified via: Office website    Wendal Wilkie P. Cris Talavera, CPhT [facility] [employer]  [phone]

## 2020-09-18 ENCOUNTER — Encounter: Payer: Self-pay | Admitting: Cardiology

## 2020-09-18 ENCOUNTER — Ambulatory Visit (INDEPENDENT_AMBULATORY_CARE_PROVIDER_SITE_OTHER): Payer: Medicare HMO | Admitting: Cardiology

## 2020-09-18 ENCOUNTER — Other Ambulatory Visit: Payer: Self-pay

## 2020-09-18 VITALS — BP 102/52 | HR 72 | Ht 71.0 in | Wt 228.0 lb

## 2020-09-18 DIAGNOSIS — I2510 Atherosclerotic heart disease of native coronary artery without angina pectoris: Secondary | ICD-10-CM | POA: Diagnosis not present

## 2020-09-18 DIAGNOSIS — E7800 Pure hypercholesterolemia, unspecified: Secondary | ICD-10-CM | POA: Diagnosis not present

## 2020-09-18 DIAGNOSIS — I10 Essential (primary) hypertension: Secondary | ICD-10-CM | POA: Diagnosis not present

## 2020-09-18 DIAGNOSIS — Z720 Tobacco use: Secondary | ICD-10-CM

## 2020-09-18 NOTE — Patient Instructions (Signed)
Medication Instructions:   STOP taking your Norvasc (Amlodipine).  *If you need a refill on your cardiac medications before your next appointment, please call your pharmacy*   Lab Work: None ordered If you have labs (blood work) drawn today and your tests are completely normal, you will receive your results only by: MyChart Message (if you have MyChart) OR A paper copy in the mail If you have any lab test that is abnormal or we need to change your treatment, we will call you to review the results.   Testing/Procedures: None ordered   Follow-Up: At [facility], you and your health needs are our priority.  As part of our continuing mission to provide you with exceptional heart care, we have created designated Provider Care Teams.  These Care Teams include your primary Cardiologist (physician) and Advanced Practice Providers (APPs -  Physician Assistants and Nurse Practitioners) who all work together to provide you with the care you need, when you need it.  We recommend signing up for the patient portal called "MyChart".  Sign up information is provided on this After Visit Summary.  MyChart is used to connect with patients for Virtual Visits (Telemedicine).  Patients are able to view lab/test results, encounter notes, upcoming appointments, etc.  Non-urgent messages can be sent to your provider as well.   To learn more about what you can do with MyChart, go to [email or web address].    Your next appointment:   6 month(s)  The format for your next appointment:   In Person  Provider:   Debbe Odea, MD   Other Instructions

## 2020-09-18 NOTE — Progress Notes (Signed)
Cardiology Office Note:    Date:  09/18/2020   ID:  [name], DOB 07/05/1950, MRN [id number]  PCP:  Smitty CordsKaramalegos, Alexander J, DO  Cardiologist:  Debbe OdeaBrian Agbor-Etang, MD  Electrophysiologist:  None   Referring MD: Saralyn PilarKaramalegos, Alexander *   Chief Complaint  Patient presents with   Follow-up    6 months   Pt states he is doing well, though has been feeling tired.     History of Present Illness:    Daniel SanesKenneth Eugene Berg is a 70 y.o. male with a hx of CAD/NSTEMI (PCI/DES to RCA 12/2019), hypertension, hyperlipidemia, CKD-3, GERD, PE treated with Eliquis, chews tobacco who presents for follow-up.    Being seen due to CAD and angina.  Carvedilol previously increased to 12.5 mg twice daily with good antianginal effect.  Denies chest pain on higher dose of Coreg.  States feeling more tired than usual.  Attributes his being tired due to working in the heat.  Tolerating medications without any adverse effects.   Prior notes Echocardiogram 12/2019, normal ejection fraction 60 to 65%. Left heart cath 12/30/2019 proximal RCA 90%, proximal LAD 40%, moderate left circumflex disease.  Status post successful angioplasty and drug-eluting stent placement to proximal RCA.  Past Medical History:  Diagnosis Date   Allergy    Chronic headaches    GERD (gastroesophageal reflux disease)    Heart burn    Hyperlipidemia    Hypertension     Past Surgical History:  Procedure Laterality Date   BACK SURGERY  02/26/2019   KNEE SURGERY     LEFT HEART CATH AND CORONARY ANGIOGRAPHY N/A 12/30/2019   Procedure: LEFT HEART CATH AND CORONARY ANGIOGRAPHY;  Surgeon: Iran OuchArida, Muhammad A, MD;  Location: [facility] INVASIVE CV LAB;  Service: Cardiovascular;  Laterality: N/A;   SHOULDER SURGERY     TONSILLECTOMY      Current Medications: Current Meds  Medication Sig   ACCU-CHEK SMARTVIEW test strip USE 1 STRIP TO CHECK GLUCOSE TWICE DAILY   acetaminophen (TYLENOL) 500 MG tablet Take 1,000 mg by mouth every 8  (eight) hours as needed.    allopurinol (ZYLOPRIM) 100 MG tablet Take 1 tablet (100 mg total) by mouth at bedtime.   aspirin EC 81 MG EC tablet Take 1 tablet (81 mg total) by mouth daily. Swallow whole.   atorvastatin (LIPITOR) 10 MG tablet Take 1 tablet (10 mg total) by mouth daily.   BYDUREON BCISE 2 MG/0.85ML AUIJ INJECT 2 MG SUBCUTANEOUSLY ONCE A WEEK (Patient taking differently: Inject 2 mg into the skin once a week.)   calcium carbonate (TUMS - DOSED IN MG ELEMENTAL CALCIUM) 500 MG chewable tablet Chew 2 tablets by mouth at bedtime as needed for indigestion or heartburn.   carvedilol (COREG) 12.5 MG tablet Take 1 tablet (12.5 mg total) by mouth 2 (two) times daily.   ezetimibe (ZETIA) 10 MG tablet Take 1 tablet (10 mg total) by mouth daily.   fluticasone (FLONASE) 50 MCG/ACT nasal spray USE 2 SPRAYS IN EACH NOSTRIL ONCE DAILY USE FOR 4 TO 6 WEEKS, THEN STOP AND USE SEASONALLY OR AS NEEDED.   gabapentin (NEURONTIN) 100 MG capsule Start 1 capsule daily, increase by 1 cap every 2-3 days as tolerated up to 3 times a day, or may take 3 at once in evening.   Insulin Glargine ([name] KWIKPEN) 100 UNIT/ML Inject 28 Units into the skin at bedtime.   Insulin Pen Needle (NOVOFINE PLUS) 32G X 4 MM MISC Use with [name] insulin daily injection as  instructed   lisinopril (ZESTRIL) 40 MG tablet Take 1 tablet (40 mg total) by mouth daily.   nitroGLYCERIN (NITROSTAT) 0.4 MG SL tablet Place 1 tablet (0.4 mg total) under the tongue every 5 (five) minutes as needed for chest pain.   omeprazole (PRILOSEC) 20 MG capsule Take 1 capsule (20 mg total) by mouth daily before breakfast.   ticagrelor (BRILINTA) 90 MG TABS tablet Take 1 tablet (90 mg total) by mouth 2 (two) times daily.   [DISCONTINUED] amLODipine (NORVASC) 2.5 MG tablet Take 1 tablet (2.5 mg total) by mouth daily.     Allergies:   Crestor [rosuvastatin calcium], Invokana [canagliflozin], Prednisone, and Sulfamethizole   Social History    Socioeconomic History   Marital status: Married    Spouse name: Not on file   Number of children: Not on file   Years of education: Not on file   Highest education level: Not on file  Occupational History   Occupation: retired  Tobacco Use   Smoking status: Former    Pack years: 0.00    Types: Cigars    Quit date: 2000    Years since quitting: 22.4   Smokeless tobacco: Current    Types: Chew   Tobacco comments:    wife states patient never smoked cigarettes, just chewing tobacco  Vaping Use   Vaping Use: Never used  Substance and Sexual Activity   Alcohol use: Not Currently    Alcohol/week: 0.0 standard drinks   Drug use: No   Sexual activity: Yes    Birth control/protection: Inserts  Other Topics Concern   Not on file  Social History Narrative   Not on file   Social Determinants of Health   Financial Resource Strain: Low Risk    Difficulty of Paying Living Expenses: Not hard at all  Food Insecurity: No Food Insecurity   Worried About Programme researcher, broadcasting/film/video in the Last Year: Never true   Ran Out of Food in the Last Year: Never true  Transportation Needs: No Transportation Needs   Lack of Transportation (Medical): No   Lack of Transportation (Non-Medical): No  Physical Activity: Sufficiently Active   Days of Exercise per Week: 5 days   Minutes of Exercise per Session: 30 min  Stress: No Stress Concern Present   Feeling of Stress : Not at all  Social Connections: Not on file     Family History: The patient's family history includes Cancer in his father; Heart disease in his brother and sister. There is no history of Prostate cancer.  ROS:   Please see the history of present illness.     All other systems reviewed and are negative.  EKGs/Labs/Other Studies Reviewed:    The following studies were reviewed today:   EKG:  EKG is ordered today.  EKG shows normal sinus rhythm, old anteroseptal infarct  Recent Labs: 12/27/2019: ALT 19 12/31/2019: BUN 18;  Creatinine, Ser 1.59; Hemoglobin 10.8; Platelets 252; Potassium 4.0; Sodium 138  Recent Lipid Panel    Component Value Date/Time   CHOL 147 12/29/2019 0026   CHOL 212 (H) 11/12/2019 0829   TRIG 186 (H) 12/29/2019 0026   HDL 29 (L) 12/29/2019 0026   HDL 32 (L) 11/12/2019 0829   CHOLHDL 5.1 12/29/2019 0026   VLDL 37 12/29/2019 0026   LDLCALC 81 12/29/2019 0026   LDLCALC 139 (H) 11/12/2019 0829    Physical Exam:    VS:  BP (!) 102/52   Pulse 72   Ht 5\' 11"  (1.803  m)   Wt 228 lb (103.4 kg)   BMI 31.80 kg/m     Wt Readings from Last 3 Encounters:  09/18/20 228 lb (103.4 kg)  07/10/20 226 lb 12.8 oz (102.9 kg)  03/26/20 228 lb 4 oz (103.5 kg)     GEN:  Well nourished, well developed in no acute distress HEENT: Normal NECK: No JVD; No carotid bruits LYMPHATICS: No lymphadenopathy CARDIAC: RRR, no murmurs, rubs, gallops RESPIRATORY:  Clear to auscultation without rales, wheezing or rhonchi  ABDOMEN: Soft, non-tender, non-distended MUSCULOSKELETAL:  No edema; No deformity  SKIN: Warm and dry NEUROLOGIC:  Alert and oriented x 3 PSYCHIATRIC:  Normal affect   ASSESSMENT:    1. Coronary artery disease involving native coronary artery of native heart, unspecified whether angina present   2. Primary hypertension   3. Pure hypercholesterolemia   4. Tobacco chew use     PLAN:    1.  History of CAD/NSTEMI status post PCI to RCA (12/30/2019).  Denies chest pain .Continue aspirin, Brilinta x 12 months.  Continue Coreg, Lipitor.  Echo showed normal EF, 60 to 65%.    2.  History of hypertension, BP controlled/low, patient is complaining of fatigue.  Stop Norvasc.  Continue Coreg, lisinopril.   3.  hyperlipidemia, continue Lipitor, Zetia.  Higher doses of statins not tolerated.  4.  Patient chews tobacco.  Cessation advised  Follow-up in 6 months.  Total encounter time 40 minutes  Greater than 50% was spent in counseling and coordination of care with the patient   Medication  Adjustments/Labs and Tests Ordered: Current medicines are reviewed at length with the patient today.  Concerns regarding medicines are outlined above.  Orders Placed This Encounter  Procedures   EKG 12-Lead    No orders of the defined types were placed in this encounter.   Patient Instructions  Medication Instructions:   STOP taking your Norvasc (Amlodipine).  *If you need a refill on your cardiac medications before your next appointment, please call your pharmacy*   Lab Work: None ordered If you have labs (blood work) drawn today and your tests are completely normal, you will receive your results only by: MyChart Message (if you have MyChart) OR A paper copy in the mail If you have any lab test that is abnormal or we need to change your treatment, we will call you to review the results.   Testing/Procedures: None ordered   Follow-Up: At [facility], you and your health needs are our priority.  As part of our continuing mission to provide you with exceptional heart care, we have created designated Provider Care Teams.  These Care Teams include your primary Cardiologist (physician) and Advanced Practice Providers (APPs -  Physician Assistants and Nurse Practitioners) who all work together to provide you with the care you need, when you need it.  We recommend signing up for the patient portal called "MyChart".  Sign up information is provided on this After Visit Summary.  MyChart is used to connect with patients for Virtual Visits (Telemedicine).  Patients are able to view lab/test results, encounter notes, upcoming appointments, etc.  Non-urgent messages can be sent to your provider as well.   To learn more about what you can do with MyChart, go to [email or web address].    Your next appointment:   6 month(s)  The format for your next appointment:   In Person  Provider:   Debbe Odea, MD   Other Instructions    Signed, Debbe Odea, MD  09/18/2020  12:52 PM    [facility]

## 2020-10-07 ENCOUNTER — Ambulatory Visit (INDEPENDENT_AMBULATORY_CARE_PROVIDER_SITE_OTHER): Payer: Medicare HMO | Admitting: Pharmacist

## 2020-10-07 DIAGNOSIS — N183 Chronic kidney disease, stage 3 unspecified: Secondary | ICD-10-CM

## 2020-10-07 DIAGNOSIS — E785 Hyperlipidemia, unspecified: Secondary | ICD-10-CM | POA: Diagnosis not present

## 2020-10-07 DIAGNOSIS — E1169 Type 2 diabetes mellitus with other specified complication: Secondary | ICD-10-CM | POA: Diagnosis not present

## 2020-10-07 DIAGNOSIS — I129 Hypertensive chronic kidney disease with stage 1 through stage 4 chronic kidney disease, or unspecified chronic kidney disease: Secondary | ICD-10-CM

## 2020-10-07 DIAGNOSIS — Z794 Long term (current) use of insulin: Secondary | ICD-10-CM | POA: Diagnosis not present

## 2020-10-07 NOTE — Patient Instructions (Signed)
Visit Information  PATIENT GOALS:  Goals Addressed             This Visit's Progress    Pharmacy - Patient Goals       Our goal A1c is less than 7%. This corresponds with fasting sugars less than 130 and 2 hour after meal sugars less than 180. Please check your blood sugar and keep a record of the results  Please check your blood pressure and keep record of the results  Our goal bad cholesterol, or LDL, is less than 70 . This is why it is important to continue taking your atorvastatin and ezetimibe  Feel free to call me with any questions or concerns. I look forward to our next call   [name], PharmD, BCACP Clinical Pharmacist [facility] [phone]           The patient verbalized understanding of instructions, educational materials, and care plan provided today and declined offer to receive copy of patient instructions, educational materials, and care plan.   Telephone follow up appointment with care management team member scheduled for:7/27 at 8:30 am

## 2020-10-07 NOTE — Chronic Care Management (AMB) (Signed)
Chronic Care Management Pharmacy Note  10/07/2020 Name:  [name] MRN:  [id number] DOB:  March 10, 1951   Subjective: [name] is an 70 y.o. year old male who is a primary patient of [name], DO.  The CCM team was consulted for assistance with disease management and care coordination needs.    Engaged with patient by telephone for follow up visit in response to provider referral for pharmacy case management and/or care coordination services.   Consent to Services:  The patient was given information about Chronic Care Management services, agreed to services, and gave verbal consent prior to initiation of services.  Please see initial visit note for detailed documentation.   Patient Care Team: [name], DO as PCP - General (Family Medicine) [name], MD as PCP - Cardiology (Cardiology) Vella Raring, RPH-CPP as Pharmacist  Recent consult visits: Office Visit at [facility] on 6/24 for follow up  Hospital visits: None in previous 6 months  Objective:  Lab Results  Component Value Date   CREATININE 1.59 (H) 12/31/2019   CREATININE 1.65 (H) 12/30/2019   CREATININE 1.88 (H) 12/29/2019    Lab Results  Component Value Date   HGBA1C 10.1 (A) 07/10/2020   Last diabetic Eye exam:  Lab Results  Component Value Date/Time   HMDIABEYEEXA No Retinopathy 03/17/2020 12:00 AM    Last diabetic Foot exam: No results found for: HMDIABFOOTEX      Component Value Date/Time   CHOL 147 12/29/2019 0026   CHOL 212 (H) 11/12/2019 0829   TRIG 186 (H) 12/29/2019 0026   HDL 29 (L) 12/29/2019 0026   HDL 32 (L) 11/12/2019 0829   CHOLHDL 5.1 12/29/2019 0026   VLDL 37 12/29/2019 0026   LDLCALC 81 12/29/2019 0026   LDLCALC 139 (H) 11/12/2019 0829    Hepatic Function Latest Ref Rng & Units 12/27/2019 11/12/2019 01/11/2019  Total Protein 6.5 - 8.1 g/dL 8.5(H) 7.1 -  Albumin 3.5 - 5.0 g/dL 4.3 4.1 4.0  AST 15 - 41  U/L 25 18 -  ALT 0 - 44 U/L 19 16 -  Alk Phosphatase 38 - 126 U/L 58 77 -  Total Bilirubin 0.3 - 1.2 mg/dL 0.6 0.3 -  Bilirubin, Direct 0.0 - 0.2 mg/dL <0.1 - -    Clinical ASCVD: Yes  The ASCVD Risk score Mikey Bussing DC Jr., et al., 2013) failed to calculate for the following reasons:   The patient has a prior MI or stroke diagnosis    Social History   Tobacco Use  Smoking Status Former   Pack years: 0.00   Types: Cigars   Quit date: 2000   Years since quitting: 22.5  Smokeless Tobacco Current   Types: Chew  Tobacco Comments   wife states patient never smoked cigarettes, just chewing tobacco   BP Readings from Last 3 Encounters:  09/18/20 (!) 102/52  07/10/20 (!) 118/52  03/26/20 134/72   Pulse Readings from Last 3 Encounters:  09/18/20 72  07/10/20 73  03/26/20 79   Wt Readings from Last 3 Encounters:  09/18/20 228 lb (103.4 kg)  07/10/20 226 lb 12.8 oz (102.9 kg)  03/26/20 228 lb 4 oz (103.5 kg)    Assessment: Review of patient past medical history, allergies, medications, health status, including review of consultants reports, laboratory and other test data, was performed as part of comprehensive evaluation and provision of chronic care management services.   SDOH:  (Social Determinants of Health) assessments and interventions  performed: none   CCM Care Plan  Allergies  Allergen Reactions   Crestor [Rosuvastatin Calcium] Other (See Comments)    myalgia   Invokana [Canagliflozin] Other (See Comments)    dehydrated   Prednisone Other (See Comments)   Sulfamethizole     Medications Reviewed Today     Reviewed by [name], MD (Physician) on 09/18/20 at [phone]  Med List Status: <None>   Medication Order Taking? Sig Documenting Provider Last Dose Status Informant  ACCU-CHEK SMARTVIEW test strip [id number] Yes USE 1 STRIP TO CHECK GLUCOSE TWICE DAILY Karamalegos, [name], DO Taking Active   acetaminophen (TYLENOL) 500 MG tablet [id number] Yes Take 1,000  mg by mouth every 8 (eight) hours as needed.  [provider] Taking Active Spouse/Significant Other  allopurinol (ZYLOPRIM) 100 MG tablet [id number] Yes Take 1 tablet (100 mg total) by mouth at bedtime. [name], DO Taking Active   aspirin EC 81 MG EC tablet [id number] Yes Take 1 tablet (81 mg total) by mouth daily. Swallow whole. [name], MD Taking Active   atorvastatin (LIPITOR) 10 MG tablet [id number] Yes Take 1 tablet (10 mg total) by mouth daily. [name], DO Taking Active   BYDUREON BCISE 2 MG/0.85ML [place] [id number] Yes INJECT 2 MG SUBCUTANEOUSLY ONCE A WEEK  Patient taking differently: Inject 2 mg into the skin once a week.   [name], DO Taking Active            Med Note [facility], Bakary Bramer A   Fri May 29, 2020  9:07 AM) On Tuesdays  calcium carbonate (TUMS - DOSED IN MG ELEMENTAL CALCIUM) 500 MG chewable tablet [id number] Yes Chew 2 tablets by mouth at bedtime as needed for indigestion or heartburn. [provider] Taking Active   carvedilol (COREG) 12.5 MG tablet [id number] Yes Take 1 tablet (12.5 mg total) by mouth 2 (two) times daily. [name], DO Taking Active   ezetimibe (ZETIA) 10 MG tablet [id number] Yes Take 1 tablet (10 mg total) by mouth daily. [name], DO Taking Active   fluticasone (FLONASE) 50 MCG/ACT nasal spray [id number] Yes USE 2 SPRAYS IN EACH NOSTRIL ONCE DAILY USE FOR 4 TO 6 WEEKS, THEN STOP AND USE SEASONALLY OR AS NEEDED. [name], DO Taking Active   gabapentin (NEURONTIN) 100 MG capsule [id number] Yes Start 1 capsule daily, increase by 1 cap every 2-3 days as tolerated up to 3 times a day, or may take 3 at once in evening. [name], DO Taking Active   Insulin Glargine [facility] KWIKPEN) 100 UNIT/ML [id number] Yes Inject 28 Units into the skin at bedtime. [name], DO Taking Active   Insulin Pen Needle (NOVOFINE PLUS) 32G X 4 MM  MISC [id number] Yes Use with [name] insulin daily injection as instructed [name], DO Taking Active   lisinopril (ZESTRIL) 40 MG tablet [id number] Yes Take 1 tablet (40 mg total) by mouth daily. [name], DO Taking Active   nitroGLYCERIN (NITROSTAT) 0.4 MG SL tablet [id number] Yes Place 1 tablet (0.4 mg total) under the tongue every 5 (five) minutes as needed for chest pain. [name], MD Taking Active   omeprazole (PRILOSEC) 20 MG capsule [id number] Yes Take 1 capsule (20 mg total) by mouth daily before breakfast. [name], DO Taking Active   ticagrelor (BRILINTA) 90 MG TABS tablet [id number] Yes Take 1 tablet (90 mg total) by mouth 2 (two) times daily. [name], DO Taking Active  Patient Active Problem List   Diagnosis Date Noted   Hx of gout    Elevated PSA, less than 10 ng/ml 10/03/2017   Lumbar radiculopathy 10/03/2017   Drug-induced myopathy 10/03/2017   Obesity (BMI 30.0-34.9) 10/03/2017   History of pulmonary embolus (PE) 12/05/2016   Chronic pain of left knee 10/07/2016   CKD stage 3 due to type 2 diabetes mellitus ([place]) 10/07/2016   Muscle strain 11/17/2015   Low back pain 10/08/2015   Hx of hematuria 07/16/2015   Ganglion cyst of left foot 01/23/2015   Smokeless tobacco use 01/12/2015   Type 2 diabetes mellitus with other specified complication ([place]) 74/94/4967   Benign hypertension with CKD (chronic kidney disease) stage III ([place]) 10/16/2014   Hyperlipidemia associated with type 2 diabetes mellitus ([place]) 10/16/2014   Gout 10/16/2014   GERD without esophagitis 10/16/2014   Injury of tendon of upper extremity 05/29/2014   Glenoid labral tear 05/29/2014   Complete rotator cuff rupture of left shoulder 05/09/2014   Infraspinatus tenosynovitis 05/09/2014   Other synovitis and tenosynovitis, right shoulder 05/09/2014    Immunization History  Administered Date(s) Administered   PFIZER(Purple  Top)SARS-COV-2 Vaccination 05/15/2019, 06/05/2019   Tdap 07/25/2016    Conditions to be addressed/monitored: HTN, HLD, and DMII  Care Plan : PharmD - Medication Management  Updates made by Vella Raring, RPH-CPP since 10/07/2020 12:00 AM     Problem: Disease Progression      Long-Range Goal: Disease Progression Prevented or Minimized   Start Date: 04/22/2020  Expected End Date: 07/21/2020  Recent Progress: On track  Priority: High  Note:   Current Barriers:  Financial Barriers - patient has [employer] and reports copay for Bydureon & insulin glargine is cost prohibitive at this time Patient APPROVED for patient assistance for [name] from [place] through through 03/27/2021 Patient APPROVED for patient assistance for Bydureon from [name] through 03/27/2021 Lack of blood sugar or blood pressure readings for clinical team  Pharmacist Clinical Goal(s):  Over the next 90 days, patient will achieve adherence to monitoring guidelines and medication adherence to achieve therapeutic efficacy through collaboration with PharmD and provider.   Interventions: 1:1 collaboration with [name], DO regarding development and update of comprehensive plan of care as evidenced by provider attestation and co-signature Inter-disciplinary care team collaboration (see longitudinal plan of care) Perform chart review. Patient seen for Office Visit at [facility] on 6/24 for follow up Provider advised patient to stop amlodipine due to complaint of fatigue  Diabetes: Uncontrolled; current treatment: Bydureon 2 mg weekly [name] 29 units QHS  Reports recent home blood sugar readings:  Morning Blood Sugar* Notes  5 - July 224   6 - July -   7 - July -   8 - July -   9 - July -   10 - July 287* *Eating prior to readings and overnight  11 - July 226   12 - July 211   13 - July 207    Patient reports he is not sure which of the above readings are  fasting as recalls has recently already started eating breakfast sometimes prior to blood sugar check Advise patient to restart monitoring fasting CBG readings daily and adjust [name] dose, as directed by PCP by 1 unit weekly if fasting readings consistently >150 Attributes recent elevated readings to eating larger portion sizes for supper and snacking before bedtime Current dietary patterns: breakfast: eggs and sausage; lunch: sandwich; supper: [place] with mac and  cheese, potatoes, bread; drink: water and diet soda Attributes large gap between his lunch (~noon) and supper (when gets home) to overeating at suppertime and before bed Encourage patient to try having a snack between lunch and supper Reports will try having peanut butter and crackers as snack Discuss importance of having well balanced meals. Encourage to add non-starchy vegetables to meals Counsel on rational/importance of blood sugar control Discuss impact of diet, exercise and weight on blood sugar control Reports has been exercising most days, staying busy working with son as well as walking Reports that his weight is up by ~5 lbs recently Reports wants to make dietary changes and would like to follow up with CM Pharmacist in 2 weeks to help hold him accountable: Reduce portion sizes Cut back on evening snacking, particularly of ice cream and cookies  Hypertension: Controlled; current treatment: Carvedilol 12.5 mg twice daily Lisinopril 40 mg daily  Confirms stopped taking amlodipine as directed by Cardiologist Denies checking home BP recently Counsel on BP monitoring technique Encourage patient to start monitor home blood pressure, keep log of results and have to review at upcoming appointments  Hyperlipidemia: Current treatment: Ezetimibe 10 mg daily Atorvastatin 10 mg daily Previous therapies tried: atorvastatin 40 mg daily (unable to tolerate due to myalgia) Have counseled on importance of cholesterol  control and dietary considerations to help improve cholesterol and counsel to review nutrition labels Counsel on importance of medication adherence  Medication Assistance: Reports no longer interested in completing [name] patient assistance program paperwork as believes will only need one more refill to complete planned therapy from Cardiologist Send message to [place] to let her know  Patient Goals/Self-Care Activities Over the next 90 days, patient will:  - take medications as directed  Patient uses weekly pillbox to manage medications - check glucose, document, and provide at future appointments - check blood pressure, document, and provide at future appointments - attend medical appointments as scheduled  Next appointment with PCP on 8/16  Follow Up Plan: Telephone follow up appointment with care management team member scheduled for: 7/27 at 8:30 am      Medication Assistance:  Patient APPROVED for patient assistance for [place] from [place] through through 03/27/2021 Patient APPROVED for patient assistance for Bydureon from [name] through 03/27/2021  Patient's preferred pharmacy is:  [facility] [address], [place] - [place] [address] Phone: [phone] Fax: [facility] - [place], [place] [address] Phone: [phone] Fax: [phone]  Uses pill box? Yes   Follow Up:  Patient agrees to Care Plan and Follow-up.  Harlow Asa, PharmD, [place], CPP Clinical Pharmacist [facility] [phone]

## 2020-10-15 ENCOUNTER — Other Ambulatory Visit: Payer: Self-pay | Admitting: Family Medicine

## 2020-10-15 DIAGNOSIS — I252 Old myocardial infarction: Secondary | ICD-10-CM

## 2020-10-15 DIAGNOSIS — E1169 Type 2 diabetes mellitus with other specified complication: Secondary | ICD-10-CM

## 2020-10-15 DIAGNOSIS — E785 Hyperlipidemia, unspecified: Secondary | ICD-10-CM

## 2020-10-15 NOTE — Telephone Encounter (Signed)
Requested Prescriptions  Pending Prescriptions Disp Refills  . BRILINTA 90 MG TABS tablet [Pharmacy Med Name: BRILINTA 90 MG Tablet] 180 tablet 1    Sig: TAKE 1 TABLET (90 MG TOTAL) BY MOUTH 2 (TWO) TIMES DAILY.     Not Delegated - Hematology: Antiplatelets - ticagrelor Failed - 10/15/2020  3:40 AM      Failed - This refill cannot be delegated      Failed - Cr in normal range and within 180 days    Creatinine  Date Value Ref Range Status  10/21/2011 1.19 0.60 - 1.30 mg/dL Final   Creatinine, Ser  Date Value Ref Range Status  12/31/2019 1.59 (H) 0.61 - 1.24 mg/dL Final         Failed - HCT in normal range and within 180 days    HCT  Date Value Ref Range Status  12/31/2019 31.5 (L) 39.0 - 52.0 % Final   Hematocrit  Date Value Ref Range Status  11/12/2019 42.4 37.5 - 51.0 % Final         Failed - HGB in normal range and within 180 days    Hemoglobin  Date Value Ref Range Status  12/31/2019 10.8 (L) 13.0 - 17.0 g/dL Final  08/65/7846 96.2 13.0 - 17.7 g/dL Final         Failed - PLT in normal range and within 180 days    Platelets  Date Value Ref Range Status  12/31/2019 252 150 - 400 K/uL Final  11/12/2019 271 150 - 450 x10E3/uL Final         Passed - Valid encounter within last 6 months    Recent Outpatient Visits          3 months ago Type 2 diabetes mellitus with other specified complication, with long-term current use of insulin (HCC)   [facility] [name], DO   7 months ago Type 2 diabetes mellitus with other specified complication, with long-term current use of insulin (HCC)   [facility] [name], DO   9 months ago History of non-ST elevation myocardial infarction (NSTEMI)   [facility] [name], DO   11 months ago Annual physical exam   [facility] [name], DO   1 year ago Acute cystitis without hematuria   [facility] Althea Charon, Netta Neat, DO      Future Appointments            In 3 weeks Althea Charon, Netta Neat, DO [facility]           . ezetimibe (ZETIA) 10 MG tablet [Pharmacy Med Name: EZETIMIBE 10 MG Tablet] 90 tablet 0    Sig: TAKE 1 TABLET (10 MG TOTAL) BY MOUTH DAILY.     Cardiovascular:  Antilipid - Sterol Transport Inhibitors Failed - 10/15/2020  3:40 AM      Failed - HDL in normal range and within 360 days    HDL  Date Value Ref Range Status  12/29/2019 29 (L) >40 mg/dL Final  95/28/4132 32 (L) >39 mg/dL Final         Failed - Triglycerides in normal range and within 360 days    Triglycerides  Date Value Ref Range Status  12/29/2019 186 (H) <150 mg/dL Final         Passed - Total Cholesterol in normal range and within 360 days    Cholesterol, Total  Date Value  Ref Range Status  11/12/2019 212 (H) 100 - 199 mg/dL Final   Cholesterol  Date Value Ref Range Status  12/29/2019 147 0 - 200 mg/dL Final         Passed - LDL in normal range and within 360 days    LDL Chol Calc (NIH)  Date Value Ref Range Status  11/12/2019 139 (H) 0 - 99 mg/dL Final   LDL Cholesterol  Date Value Ref Range Status  12/29/2019 81 0 - 99 mg/dL Final    Comment:           Total Cholesterol/HDL:CHD Risk Coronary Heart Disease Risk Table                     Men   Women  1/2 Average Risk   3.4   3.3  Average Risk       5.0   4.4  2 X Average Risk   9.6   7.1  3 X Average Risk  23.4   11.0        Use the calculated Patient Ratio above and the CHD Risk Table to determine the patient's CHD Risk.        ATP III CLASSIFICATION (LDL):  <100     mg/dL   Optimal  440-102  mg/dL   Near or Above                    Optimal  130-159  mg/dL   Borderline  725-366  mg/dL   High  >440     mg/dL   Very High Performed at [facility], [address], [place], [place] [address]          Passed - Valid encounter within last 12 months    Recent Outpatient  Visits          3 months ago Type 2 diabetes mellitus with other specified complication, with long-term current use of insulin [facility])   [facility], Netta Neat, DO   7 months ago Type 2 diabetes mellitus with other specified complication, with long-term current use of insulin [facility])   [facility] [name], DO   9 months ago History of non-ST elevation myocardial infarction (NSTEMI)   [facility] [name], DO   11 months ago Annual physical exam   [facility] [name], DO   1 year ago Acute cystitis without hematuria   [facility] Althea Charon, Netta Neat, DO      Future Appointments            In 3 weeks Althea Charon, Netta Neat, DO [facility]

## 2020-10-15 NOTE — Telephone Encounter (Signed)
Requested medications are due for refill today.  yes  Requested medications are on the active medications list.  yes  Last refill. 06/12/2020  Future visit scheduled.   yes  Notes to clinic.  Medication not delegated.

## 2020-10-21 ENCOUNTER — Ambulatory Visit: Payer: Medicare HMO | Admitting: Pharmacist

## 2020-10-21 DIAGNOSIS — Z794 Long term (current) use of insulin: Secondary | ICD-10-CM

## 2020-10-21 DIAGNOSIS — N183 Chronic kidney disease, stage 3 unspecified: Secondary | ICD-10-CM

## 2020-10-21 DIAGNOSIS — E1169 Type 2 diabetes mellitus with other specified complication: Secondary | ICD-10-CM

## 2020-10-21 NOTE — Patient Instructions (Addendum)
Visit Information  PATIENT GOALS:  Goals Addressed             This Visit's Progress    Pharmacy - Patient Goals       Our goal A1c is less than 7%. This corresponds with fasting sugars less than 130 and 2 hour after meal sugars less than 180. Please check your blood sugar and keep a record of the results  Please check your blood pressure and keep record of the results  Our goal bad cholesterol, or LDL, is less than 70 . This is why it is important to continue taking your atorvastatin and ezetimibe  Feel free to call me with any questions or concerns. I look forward to our next call    Estelle Grumbles, PharmD, BCACP Clinical Pharmacist [facility] [phone]          The patient verbalized understanding of instructions, educational materials, and care plan provided today and declined offer to receive copy of patient instructions, educational materials, and care plan.   Telephone follow up appointment with care management team member scheduled for: 12/09/2020 at 8:30 AM

## 2020-10-21 NOTE — Chronic Care Management (AMB) (Signed)
Chronic Care Management Pharmacy Note  10/21/2020 Name:  [name] MRN:  [id number] DOB:  08/15/1950   Subjective: [name] is an 70 y.o. year old male who is a primary patient of [name], DO.  The CCM team was consulted for assistance with disease management and care coordination needs.    Engaged with patient by telephone for follow up visit in response to provider referral for pharmacy case management and/or care coordination services.   Consent to Services:  The patient was given information about Chronic Care Management services, agreed to services, and gave verbal consent prior to initiation of services.  Please see initial visit note for detailed documentation.   Patient Care Team: [name], DO as PCP - General (Family Medicine) [name], MD as PCP - Cardiology (Cardiology) Winfield Cunas Virl Diamond, RPH-CPP as Pharmacist  Recent office visits: None  Hospital visits: None in previous 6 months  Objective:  Lab Results  Component Value Date   CREATININE 1.59 (H) 12/31/2019   CREATININE 1.65 (H) 12/30/2019   CREATININE 1.88 (H) 12/29/2019    Lab Results  Component Value Date   HGBA1C 10.1 (A) 07/10/2020   Last diabetic Eye exam:  Lab Results  Component Value Date/Time   HMDIABEYEEXA No Retinopathy 03/17/2020 12:00 AM    Last diabetic Foot exam: No results found for: HMDIABFOOTEX      Component Value Date/Time   CHOL 147 12/29/2019 0026   CHOL 212 (H) 11/12/2019 0829   TRIG 186 (H) 12/29/2019 0026   HDL 29 (L) 12/29/2019 0026   HDL 32 (L) 11/12/2019 0829   CHOLHDL 5.1 12/29/2019 0026   VLDL 37 12/29/2019 0026   LDLCALC 81 12/29/2019 0026   LDLCALC 139 (H) 11/12/2019 0829    Hepatic Function Latest Ref Rng & Units 12/27/2019 11/12/2019 01/11/2019  Total Protein 6.5 - 8.1 g/dL 8.5(H) 7.1 -  Albumin 3.5 - 5.0 g/dL 4.3 4.1 4.0  AST 15 - 41 U/L 25 18 -  ALT 0 - 44 U/L 19 16 -  Alk Phosphatase 38 -  126 U/L 58 77 -  Total Bilirubin 0.3 - 1.2 mg/dL 0.6 0.3 -  Bilirubin, Direct 0.0 - 0.2 mg/dL <0.1 - -    Clinical ASCVD: Yes  The ASCVD Risk score Mikey Bussing DC Jr., et al., 2013) failed to calculate for the following reasons:   The patient has a prior MI or stroke diagnosis    Other: (CHADS2VASc if Afib, PHQ9 if depression, MMRC or CAT for COPD, ACT, DEXA)  Social History   Tobacco Use  Smoking Status Former   Types: Cigars   Quit date: 2000   Years since quitting: 22.5  Smokeless Tobacco Current   Types: Chew  Tobacco Comments   wife states patient never smoked cigarettes, just chewing tobacco   BP Readings from Last 3 Encounters:  09/18/20 (!) 102/52  07/10/20 (!) 118/52  03/26/20 134/72   Pulse Readings from Last 3 Encounters:  09/18/20 72  07/10/20 73  03/26/20 79   Wt Readings from Last 3 Encounters:  09/18/20 228 lb (103.4 kg)  07/10/20 226 lb 12.8 oz (102.9 kg)  03/26/20 228 lb 4 oz (103.5 kg)    Assessment: Review of patient past medical history, allergies, medications, health status, including review of consultants reports, laboratory and other test data, was performed as part of comprehensive evaluation and provision of chronic care management services.   SDOH:  (Social Determinants of Health) assessments and interventions  performed: yes SDOH Interventions    Flowsheet Row Most Recent Value  SDOH Interventions   SDOH Interventions for the Following Domains Physical Activity  Physical Activity Interventions Other (Comments)  [Encourage patient to start walking more]       CCM Care Plan  Allergies  Allergen Reactions   Crestor [Rosuvastatin Calcium] Other (See Comments)    myalgia   Invokana [Canagliflozin] Other (See Comments)    dehydrated   Prednisone Other (See Comments)   Sulfamethizole     Medications Reviewed Today     Reviewed by Vella Raring, RPH-CPP (Pharmacist) on 10/21/20 at [facility] List Status: <None>   Medication Order  Taking? Sig Documenting Provider Last Dose Status Informant  ACCU-CHEK SMARTVIEW test strip [id number]  USE 1 STRIP TO CHECK GLUCOSE TWICE DAILY Karamalegos, [name], DO  Active   acetaminophen (TYLENOL) 500 MG tablet [id number]  Take 1,000 mg by mouth every 8 (eight) hours as needed.  [provider]  Active Spouse/Significant Other  allopurinol (ZYLOPRIM) 100 MG tablet [id number]  Take 1 tablet (100 mg total) by mouth at bedtime. [name], DO  Active   aspirin EC 81 MG EC tablet [id number]  Take 1 tablet (81 mg total) by mouth daily. Swallow whole. [name], MD  Active   atorvastatin (LIPITOR) 10 MG tablet [id number] Yes Take 1 tablet (10 mg total) by mouth daily. [name], DO Taking Active   BRILINTA 90 MG TABS tablet [id number]  TAKE 1 TABLET (90 MG TOTAL) BY MOUTH 2 (TWO) TIMES DAILY. [name], DO  Active   BYDUREON BCISE 2 MG/0.85ML [place] [id number] Yes INJECT 2 MG SUBCUTANEOUSLY ONCE A WEEK  Patient taking differently: Inject 2 mg into the skin once a week.   [name], DO Taking Active            Med Note [facility], Dayshia Ballinas A   Fri May 29, 2020  9:07 AM) On Tuesdays  calcium carbonate (TUMS - DOSED IN MG ELEMENTAL CALCIUM) 500 MG chewable tablet [id number]  Chew 2 tablets by mouth at bedtime as needed for indigestion or heartburn. [provider]  Active   carvedilol (COREG) 12.5 MG tablet [id number] Yes Take 1 tablet (12.5 mg total) by mouth 2 (two) times daily. [name], DO Taking Active   ezetimibe (ZETIA) 10 MG tablet [id number] Yes TAKE 1 TABLET (10 MG TOTAL) BY MOUTH DAILY. [name], DO Taking Active   fluticasone (FLONASE) 50 MCG/ACT nasal spray [id number]  USE 2 SPRAYS IN EACH NOSTRIL ONCE DAILY USE FOR 4 TO 6 WEEKS, THEN STOP AND USE SEASONALLY OR AS NEEDED. Karamalegos, [name], DO  Active   gabapentin (NEURONTIN) 100 MG capsule [id number]  Start 1 capsule daily,  increase by 1 cap every 2-3 days as tolerated up to 3 times a day, or may take 3 at once in evening. [name], DO  Active   Insulin Glargine [facility] KWIKPEN) 100 UNIT/ML [id number] Yes Inject 30 Units into the skin at bedtime. [name], DO Taking Active   Insulin Pen Needle (NOVOFINE PLUS) 32G X 4 MM MISC [id number]  Use with [name] insulin daily injection as instructed [name], DO  Active   lisinopril (ZESTRIL) 40 MG tablet [id number] Yes Take 1 tablet (40 mg total) by mouth daily. [name], DO Taking Active   nitroGLYCERIN (NITROSTAT) 0.4 MG SL tablet [id number]  Place 1 tablet (0.4 mg total) under the tongue every  5 (five) minutes as needed for chest pain. [name], MD  Active   omeprazole (PRILOSEC) 20 MG capsule [id number]  Take 1 capsule (20 mg total) by mouth daily before breakfast. [name], DO  Active             Patient Active Problem List   Diagnosis Date Noted   Hx of gout    Elevated PSA, less than 10 ng/ml 10/03/2017   Lumbar radiculopathy 10/03/2017   Drug-induced myopathy 10/03/2017   Obesity (BMI 30.0-34.9) 10/03/2017   History of pulmonary embolus (PE) 12/05/2016   Chronic pain of left knee 10/07/2016   CKD stage 3 due to type 2 diabetes mellitus ([place]) 10/07/2016   Muscle strain 11/17/2015   Low back pain 10/08/2015   Hx of hematuria 07/16/2015   Ganglion cyst of left foot 01/23/2015   Smokeless tobacco use 01/12/2015   Type 2 diabetes mellitus with other specified complication ([place]) 78/24/2353   Benign hypertension with CKD (chronic kidney disease) stage III ([place]) 10/16/2014   Hyperlipidemia associated with type 2 diabetes mellitus ([place]) 10/16/2014   Gout 10/16/2014   GERD without esophagitis 10/16/2014   Injury of tendon of upper extremity 05/29/2014   Glenoid labral tear 05/29/2014   Complete rotator cuff rupture of left shoulder 05/09/2014   Infraspinatus tenosynovitis  05/09/2014   Other synovitis and tenosynovitis, right shoulder 05/09/2014    Immunization History  Administered Date(s) Administered   PFIZER(Purple Top)SARS-COV-2 Vaccination 05/15/2019, 06/05/2019   Tdap 07/25/2016    Conditions to be addressed/monitored: HTN, HLD, and DMII  Care Plan : PharmD - Medication Management  Updates made by Vella Raring, RPH-CPP since 10/21/2020 12:00 AM     Problem: Disease Progression      Long-Range Goal: Disease Progression Prevented or Minimized   Start Date: 04/22/2020  Expected End Date: 07/21/2020  This Visit's Progress: On track  Recent Progress: On track  Priority: High  Note:   Current Barriers:  Financial Barriers - patient has [employer] and reports copay for Bydureon & insulin glargine is cost prohibitive at this time Patient APPROVED for patient assistance for [name] from [place] through through 03/27/2021 Patient APPROVED for patient assistance for Bydureon from [name] through 03/27/2021 Lack of blood sugar or blood pressure readings for clinical team  Pharmacist Clinical Goal(s):  Over the next 90 days, patient will achieve adherence to monitoring guidelines and medication adherence to achieve therapeutic efficacy through collaboration with PharmD and provider.   Interventions: 1:1 collaboration with [name], DO regarding development and update of comprehensive plan of care as evidenced by provider attestation and co-signature Inter-disciplinary care team collaboration (see longitudinal plan of care) Perform chart review. Patient seen for Office Visit at [facility] on 6/24 for follow up Provider advised patient to stop amlodipine due to complaint of fatigue  Diabetes: Uncontrolled; current treatment: Bydureon 2 mg weekly [name] 30 units QHS  Reports recent home morning fasting blood sugar readings:  Morning Blood Sugar  18 - July 181  19 - July 206  20 - July 198  21 -  July 160  22 - July 225  23 - July -  24 - July 175  25 - July 197  26 - July 225  27 - July 238  Average 200   Reports he tried having a snack between lunch and supper and that supper portion sizes have been smaller, but still snacking on brownies and cookies at bedtime Encourage patient to continue  to make positive dietary changes: Discuss alternative bedtime snack options for patient to try. Patient plans to try an apple with peanut butter Discuss adding non-starchy vegetables to meals, particularly to supper when patient is eating at home Encourage patient to continue to check fasting CBG readings daily and adjust [name] dose, as directed by PCP by 1 unit weekly if fasting readings consistently >150 Confirms will increase [name] to 31 units QHS today Encourage patient to bring log with him to upcoming appointment with PCP Discuss impact of diet, exercise and weight on blood sugar control Reports has been exercising most days, staying busy working with son Encourage patient to try walking as well  Hypertension: Controlled; current treatment: Carvedilol 12.5 mg twice daily Lisinopril 40 mg daily  Reports BP today: 114/54, HR 65 Counsel on BP monitoring technique Encourage patient to continue to monitor home blood pressure, keep log of results and have to review at upcoming appointments  Hyperlipidemia: Current treatment: Ezetimibe 10 mg daily Atorvastatin 10 mg daily Previous therapies tried: atorvastatin 40 mg daily (unable to tolerate due to myalgia) Have counseled on importance of cholesterol control and dietary considerations to help improve cholesterol and counsel to review nutrition labels Counsel on importance of medication adherence  Medication Assistance: Reports use last dose of Bydureon yesterday, but wife has already contacted patient assistance program for refill  Patient Goals/Self-Care Activities Over the next 90 days, patient will:  - take medications as  directed  Patient uses weekly pillbox to manage medications - check glucose, document, and provide at future appointments - check blood pressure, document, and provide at future appointments - attend medical appointments as scheduled  Next appointment with PCP on 8/16  Follow Up Plan: Telephone follow up appointment with care management team member scheduled for: 12/09/2020 at 8:30 AM      Medication Assistance:  Patient APPROVED for patient assistance for [place] from [place] through through 03/27/2021 Patient APPROVED for patient assistance for Bydureon from [name] through 03/27/2021  Patient's preferred pharmacy is:  [facility], [place] - [address] [place] [address] Phone: [phone] Fax: [facility] - [place], [address] [place] [address] Phone: [phone] Fax: [phone]  Uses pill box? Yes  Follow Up:  Patient agrees to Care Plan and Follow-up.  Wallace Cullens, PharmD, Para [place], CPP Clinical Pharmacist [facility] [phone]

## 2020-10-31 ENCOUNTER — Other Ambulatory Visit: Payer: Self-pay | Admitting: Family Medicine

## 2020-10-31 DIAGNOSIS — E1169 Type 2 diabetes mellitus with other specified complication: Secondary | ICD-10-CM

## 2020-10-31 DIAGNOSIS — Z794 Long term (current) use of insulin: Secondary | ICD-10-CM

## 2020-11-02 ENCOUNTER — Other Ambulatory Visit: Payer: Self-pay

## 2020-11-02 DIAGNOSIS — Z0000 Encounter for general adult medical examination without abnormal findings: Secondary | ICD-10-CM

## 2020-11-02 DIAGNOSIS — E1122 Type 2 diabetes mellitus with diabetic chronic kidney disease: Secondary | ICD-10-CM

## 2020-11-02 DIAGNOSIS — Z794 Long term (current) use of insulin: Secondary | ICD-10-CM

## 2020-11-02 DIAGNOSIS — E1169 Type 2 diabetes mellitus with other specified complication: Secondary | ICD-10-CM

## 2020-11-02 DIAGNOSIS — I129 Hypertensive chronic kidney disease with stage 1 through stage 4 chronic kidney disease, or unspecified chronic kidney disease: Secondary | ICD-10-CM

## 2020-11-02 DIAGNOSIS — R972 Elevated prostate specific antigen [PSA]: Secondary | ICD-10-CM

## 2020-11-02 DIAGNOSIS — E785 Hyperlipidemia, unspecified: Secondary | ICD-10-CM

## 2020-11-02 DIAGNOSIS — M1000 Idiopathic gout, unspecified site: Secondary | ICD-10-CM

## 2020-11-03 ENCOUNTER — Other Ambulatory Visit: Payer: Medicare HMO

## 2020-11-03 ENCOUNTER — Other Ambulatory Visit: Payer: Self-pay

## 2020-11-03 DIAGNOSIS — R972 Elevated prostate specific antigen [PSA]: Secondary | ICD-10-CM | POA: Diagnosis not present

## 2020-11-03 DIAGNOSIS — E785 Hyperlipidemia, unspecified: Secondary | ICD-10-CM | POA: Diagnosis not present

## 2020-11-03 DIAGNOSIS — N183 Chronic kidney disease, stage 3 unspecified: Secondary | ICD-10-CM | POA: Diagnosis not present

## 2020-11-03 DIAGNOSIS — E1122 Type 2 diabetes mellitus with diabetic chronic kidney disease: Secondary | ICD-10-CM | POA: Diagnosis not present

## 2020-11-03 DIAGNOSIS — E1169 Type 2 diabetes mellitus with other specified complication: Secondary | ICD-10-CM | POA: Diagnosis not present

## 2020-11-03 DIAGNOSIS — I129 Hypertensive chronic kidney disease with stage 1 through stage 4 chronic kidney disease, or unspecified chronic kidney disease: Secondary | ICD-10-CM | POA: Diagnosis not present

## 2020-11-03 DIAGNOSIS — Z0000 Encounter for general adult medical examination without abnormal findings: Secondary | ICD-10-CM | POA: Diagnosis not present

## 2020-11-03 DIAGNOSIS — M1000 Idiopathic gout, unspecified site: Secondary | ICD-10-CM | POA: Diagnosis not present

## 2020-11-03 DIAGNOSIS — Z794 Long term (current) use of insulin: Secondary | ICD-10-CM | POA: Diagnosis not present

## 2020-11-04 LAB — CBC WITH DIFFERENTIAL/PLATELET
Absolute Monocytes: 409 cells/uL (ref 200–950)
Basophils Absolute: 37 cells/uL (ref 0–200)
Basophils Relative: 0.6 %
Eosinophils Absolute: 161 cells/uL (ref 15–500)
Eosinophils Relative: 2.6 %
HCT: 41.6 % (ref 38.5–50.0)
Hemoglobin: 13.1 g/dL — ABNORMAL LOW (ref 13.2–17.1)
Lymphs Abs: 1383 cells/uL (ref 850–3900)
MCH: 28.6 pg (ref 27.0–33.0)
MCHC: 31.5 g/dL — ABNORMAL LOW (ref 32.0–36.0)
MCV: 90.8 fL (ref 80.0–100.0)
MPV: 10.2 fL (ref 7.5–12.5)
Monocytes Relative: 6.6 %
Neutro Abs: 4210 cells/uL (ref 1500–7800)
Neutrophils Relative %: 67.9 %
Platelets: 246 10*3/uL (ref 140–400)
RBC: 4.58 10*6/uL (ref 4.20–5.80)
RDW: 13.6 % (ref 11.0–15.0)
Total Lymphocyte: 22.3 %
WBC: 6.2 10*3/uL (ref 3.8–10.8)

## 2020-11-04 LAB — COMPLETE METABOLIC PANEL WITH GFR
AG Ratio: 1.3 (calc) (ref 1.0–2.5)
ALT: 17 U/L (ref 9–46)
AST: 15 U/L (ref 10–35)
Albumin: 3.9 g/dL (ref 3.6–5.1)
Alkaline phosphatase (APISO): 73 U/L (ref 35–144)
BUN/Creatinine Ratio: 17 (calc) (ref 6–22)
BUN: 26 mg/dL — ABNORMAL HIGH (ref 7–25)
CO2: 25 mmol/L (ref 20–32)
Calcium: 9.5 mg/dL (ref 8.6–10.3)
Chloride: 102 mmol/L (ref 98–110)
Creat: 1.57 mg/dL — ABNORMAL HIGH (ref 0.70–1.35)
Globulin: 3.1 g/dL (calc) (ref 1.9–3.7)
Glucose, Bld: 323 mg/dL — ABNORMAL HIGH (ref 65–99)
Potassium: 4.5 mmol/L (ref 3.5–5.3)
Sodium: 135 mmol/L (ref 135–146)
Total Bilirubin: 0.4 mg/dL (ref 0.2–1.2)
Total Protein: 7 g/dL (ref 6.1–8.1)
eGFR: 47 mL/min/{1.73_m2} — ABNORMAL LOW (ref >=60)

## 2020-11-04 LAB — LIPID PANEL
Cholesterol: 90 mg/dL (ref <200)
HDL: 32 mg/dL — ABNORMAL LOW (ref >=40)
LDL Cholesterol (Calc): 36 mg/dL (calc)
Non-HDL Cholesterol (Calc): 58 mg/dL (calc) (ref <130)
Total CHOL/HDL Ratio: 2.8 (calc) (ref <5.0)
Triglycerides: 141 mg/dL (ref <150)

## 2020-11-04 LAB — HEMOGLOBIN A1C
Hgb A1c MFr Bld: 11.8 % of total Hgb — ABNORMAL HIGH (ref <5.7)
Mean Plasma Glucose: 292 mg/dL
eAG (mmol/L): 16.2 mmol/L

## 2020-11-04 LAB — URIC ACID: Uric Acid, Serum: 6.6 mg/dL (ref 4.0–8.0)

## 2020-11-04 LAB — PSA: PSA: 6.06 ng/mL — ABNORMAL HIGH (ref <=4.00)

## 2020-11-04 LAB — TSH: TSH: 3.16 mIU/L (ref 0.40–4.50)

## 2020-11-10 ENCOUNTER — Encounter: Payer: Self-pay | Admitting: Family Medicine

## 2020-11-10 ENCOUNTER — Other Ambulatory Visit: Payer: Self-pay

## 2020-11-10 ENCOUNTER — Ambulatory Visit (INDEPENDENT_AMBULATORY_CARE_PROVIDER_SITE_OTHER): Payer: Medicare HMO | Admitting: Family Medicine

## 2020-11-10 VITALS — BP 107/61 | HR 78 | Ht 71.0 in | Wt 230.0 lb

## 2020-11-10 DIAGNOSIS — R972 Elevated prostate specific antigen [PSA]: Secondary | ICD-10-CM | POA: Diagnosis not present

## 2020-11-10 DIAGNOSIS — N183 Chronic kidney disease, stage 3 unspecified: Secondary | ICD-10-CM | POA: Diagnosis not present

## 2020-11-10 DIAGNOSIS — Z794 Long term (current) use of insulin: Secondary | ICD-10-CM | POA: Diagnosis not present

## 2020-11-10 DIAGNOSIS — E785 Hyperlipidemia, unspecified: Secondary | ICD-10-CM | POA: Diagnosis not present

## 2020-11-10 DIAGNOSIS — E1169 Type 2 diabetes mellitus with other specified complication: Secondary | ICD-10-CM

## 2020-11-10 DIAGNOSIS — I129 Hypertensive chronic kidney disease with stage 1 through stage 4 chronic kidney disease, or unspecified chronic kidney disease: Secondary | ICD-10-CM

## 2020-11-10 DIAGNOSIS — Z0000 Encounter for general adult medical examination without abnormal findings: Secondary | ICD-10-CM

## 2020-11-10 NOTE — Progress Notes (Signed)
Subjective:    Patient ID: [name], male    DOB: January 19, 1951, 70 y.o.   MRN: [id number]  [name] is a 70 y.o. male presenting on 11/10/2020 for Annual Exam and Diabetes   HPI  Here for Annual Exam and Physical   CHRONIC DM, Type 2 with nephropathy / CKD-III Lab up to A1c 11.8 CBGs: Checks 1-2 times a day, has had fluctuating readings now on higher dose Lantus insulin. Self discontinued Metformin due to nausea. Meds: - CONTINUE Bydureon BCise 22m weekly inj - Continue Lantus back at 32u daily (he had increased this) - Remains off Metformin Reports  good compliance. Tolerating well w/o side-effects Currently on ACEi Lifestyle: - Diet (improved DM Diet, except now still increased sweets) - Exercise limited Due for DM Eye exam. Denies hypoglycemia, polyuria, visual changes, numbness or tingling.   HYPERLIPIDEMIA Drug Induced Myopathy Last time was on Atorvastatin 462mdown to 1051mNow takes Atorvastatin 31m15mily and also on Zetia 31mg45mly. Doing much better less side effects and issues. - Lipid panel shows controlled normal Lipids TC 90 and LDL 36   CHRONIC HTN CKD III Followed by [facility] Dr SinghCandiss Norseent Meds - Lisinopril 40mg 78my Reports good compliance, took meds today. Tolerating well, w/o complaints. Denies CP, dyspnea, HA, edema, dizziness / lightheadedness    Gout  No gout flares recently Allopurinol 100mg d18m tolerating well Last lab Uric Acid 6.6  Health Maintenance:   Due for Flu Shot, declines today despite counseling on benefits Due for PNA vaccine series age 62+ dec97+es    PSA Prostate Cancer Screening - up to PSA 6 (10/2020) - last year 7.0 (10/2019), prior results 4.5 > 3.89 > 5.4  He is asymptomatic, no new concerns or urinary symptoms. Had recurrent UTI in past. He had complication of prostate biopsy from [facility], had side effect UTI. Biopsy came back negative for cancer 12/04/19.    Depression screen PHQ 2/9[facility]16/2022 01/23/2020  01/21/2020  Decreased Interest 0 0 0  Down, Depressed, Hopeless 0 0 0  PHQ - 2 Score 0 0 0  Altered sleeping 0 0 -  Tired, decreased energy 0 1 -  Change in appetite 0 0 -  Feeling bad or failure about yourself  0 0 -  Trouble concentrating 0 0 -  Moving slowly or fidgety/restless 0 0 -  Suicidal thoughts 0 0 -  PHQ-9 Score 0 1 -  Difficult doing work/chores Not difficult at all Not difficult at all -    Past Medical History:  Diagnosis Date   Allergy    Chronic headaches    GERD (gastroesophageal reflux disease)    Heart burn    Hyperlipidemia    Hypertension    Past Surgical History:  Procedure Laterality Date   BACK SURGERY  02/26/2019   KNEE SURGERY     LEFT HEART CATH AND CORONARY ANGIOGRAPHY N/A 12/30/2019   Procedure: LEFT HEART CATH AND CORONARY ANGIOGRAPHY;  Surgeon: Arida, [place] [facility];  Service: Cardiovascular;  Laterality: N/A;   SHOULDER SURGERY     TONSILLECTOMY     Social History   Socioeconomic History   Marital status: Married    Spouse name: Not on file   Number of children: Not on file   Years of education: Not on file   Highest education level: Not on file  Occupational History   Occupation: retired  Tobacco Use   Smoking status: Former  Types: Cigars    Quit date: 2000    Years since quitting: 22.6   Smokeless tobacco: Current    Types: Chew   Tobacco comments:    wife states patient never smoked cigarettes, just chewing tobacco  Vaping Use   Vaping Use: Never used  Substance and Sexual Activity   Alcohol use: Not Currently    Alcohol/week: 0.0 standard drinks   Drug use: No   Sexual activity: Yes    Birth control/protection: Inserts  Other Topics Concern   Not on file  Social History Narrative   Not on file   Social Determinants of Health   Financial Resource Strain: Low Risk    Difficulty of Paying Living Expenses: Not hard at all  Food Insecurity: No Food Insecurity   Worried About  Charity fundraiser in the Last Year: Never true   Ran Out of Food in the Last Year: Never true  Transportation Needs: No Transportation Needs   Lack of Transportation (Medical): No   Lack of Transportation (Non-Medical): No  Physical Activity: Sufficiently Active   Days of Exercise per Week: 5 days   Minutes of Exercise per Session: 30 min  Stress: No Stress Concern Present   Feeling of Stress : Not at all  Social Connections: Not on file  Intimate Partner Violence: Not on file   Family History  Problem Relation Age of Onset   Cancer Father        lung   Heart disease Sister    Heart disease Brother    Prostate cancer Neg Hx    Current Outpatient Medications on File Prior to Visit  Medication Sig   ACCU-CHEK SMARTVIEW test strip USE 1 STRIP TO CHECK GLUCOSE TWICE DAILY   acetaminophen (TYLENOL) 500 MG tablet Take 1,000 mg by mouth every 8 (eight) hours as needed.    allopurinol (ZYLOPRIM) 100 MG tablet Take 1 tablet (100 mg total) by mouth at bedtime.   aspirin EC 81 MG EC tablet Take 1 tablet (81 mg total) by mouth daily. Swallow whole.   atorvastatin (LIPITOR) 10 MG tablet Take 1 tablet (10 mg total) by mouth daily.   BRILINTA 90 MG TABS tablet TAKE 1 TABLET (90 MG TOTAL) BY MOUTH 2 (TWO) TIMES DAILY.   BYDUREON BCISE 2 MG/0.85ML AUIJ INJECT 2 MG SUBCUTANEOUSLY ONCE A WEEK (Patient taking differently: Inject 2 mg into the skin once a week.)   calcium carbonate (TUMS - DOSED IN MG ELEMENTAL CALCIUM) 500 MG chewable tablet Chew 2 tablets by mouth at bedtime as needed for indigestion or heartburn.   carvedilol (COREG) 12.5 MG tablet Take 1 tablet (12.5 mg total) by mouth 2 (two) times daily.   DROPLET PEN NEEDLES 32G X 4 MM MISC USE WITH [name] INSULIN INJECTION DAILY AS INSTRUCTED   ezetimibe (ZETIA) 10 MG tablet TAKE 1 TABLET (10 MG TOTAL) BY MOUTH DAILY.   fluticasone (FLONASE) 50 MCG/ACT nasal spray USE 2 SPRAYS IN EACH NOSTRIL ONCE DAILY USE FOR 4 TO 6 WEEKS, THEN STOP AND  USE SEASONALLY OR AS NEEDED.   gabapentin (NEURONTIN) 100 MG capsule Start 1 capsule daily, increase by 1 cap every 2-3 days as tolerated up to 3 times a day, or may take 3 at once in evening.   Insulin Glargine ([name] KWIKPEN) 100 UNIT/ML Inject 30 Units into the skin at bedtime.   lisinopril (ZESTRIL) 40 MG tablet Take 1 tablet (40 mg total) by mouth daily.   nitroGLYCERIN (NITROSTAT) 0.4 MG SL  tablet Place 1 tablet (0.4 mg total) under the tongue every 5 (five) minutes as needed for chest pain.   omeprazole (PRILOSEC) 20 MG capsule Take 1 capsule (20 mg total) by mouth daily before breakfast.   No current facility-administered medications on file prior to visit.    Review of Systems  Constitutional:  Negative for activity change, appetite change, chills, diaphoresis, fatigue and fever.  HENT:  Negative for congestion and hearing loss.   Eyes:  Negative for visual disturbance.  Respiratory:  Negative for cough, chest tightness, shortness of breath and wheezing.   Cardiovascular:  Negative for chest pain, palpitations and leg swelling.  Gastrointestinal:  Negative for abdominal pain, constipation, diarrhea, nausea and vomiting.  Genitourinary:  Negative for dysuria, frequency and hematuria.  Musculoskeletal:  Negative for arthralgias and neck pain.  Skin:  Negative for rash.  Neurological:  Negative for dizziness, weakness, light-headedness, numbness and headaches.  Hematological:  Negative for adenopathy.  Psychiatric/Behavioral:  Negative for behavioral problems, dysphoric mood and sleep disturbance.   Per HPI unless specifically indicated above      Objective:    BP 107/61   Pulse 78   Ht _0  (1.803 m)   Wt 230 lb (104.3 kg)   SpO2 98%   BMI 32.08 kg/m   Wt Readings from Last 3 Encounters:  11/10/20 230 lb (104.3 kg)  09/18/20 228 lb (103.4 kg)  07/10/20 226 lb 12.8 oz (102.9 kg)    Physical Exam Vitals and nursing note reviewed.  Constitutional:      General: He  is not in acute distress.    Appearance: He is well-developed. He is not diaphoretic.     Comments: Well-appearing, comfortable, cooperative  HENT:     Head: Normocephalic and atraumatic.  Eyes:     General:        Right eye: No discharge.        Left eye: No discharge.     Conjunctiva/sclera: Conjunctivae normal.     Pupils: Pupils are equal, round, and reactive to light.  Neck:     Thyroid: No thyromegaly.  Cardiovascular:     Rate and Rhythm: Normal rate and regular rhythm.     Pulses: Normal pulses.     Heart sounds: Normal heart sounds. No murmur heard. Pulmonary:     Effort: Pulmonary effort is normal. No respiratory distress.     Breath sounds: Normal breath sounds. No wheezing or rales.  Abdominal:     General: Bowel sounds are normal. There is no distension.     Palpations: Abdomen is soft. There is no mass.     Tenderness: There is no abdominal tenderness.  Musculoskeletal:        General: No tenderness. Normal range of motion.     Cervical back: Normal range of motion and neck supple.     Comments: Upper / Lower Extremities: - Normal muscle tone, strength bilateral upper extremities 5/5, lower extremities 5/5  Lymphadenopathy:     Cervical: No cervical adenopathy.  Skin:    General: Skin is warm and dry.     Findings: No erythema or rash.  Neurological:     Mental Status: He is alert and oriented to person, place, and time.     Comments: Distal sensation intact to light touch all extremities  Psychiatric:        Mood and Affect: Mood normal.        Behavior: Behavior normal.        Thought  Content: Thought content normal.     Comments: Well groomed, good eye contact, normal speech and thoughts     Results for orders placed or performed in visit on 11/02/20  TSH  Result Value Ref Range   TSH 3.16 0.40 - 4.50 mIU/L  Uric acid  Result Value Ref Range   Uric Acid, Serum 6.6 4.0 - 8.0 mg/dL  PSA  Result Value Ref Range   PSA 6.06 (H) < OR = 4.00 ng/mL   Lipid panel  Result Value Ref Range   Cholesterol 90 <200 mg/dL   HDL 32 (L) > OR = 40 mg/dL   Triglycerides 141 <150 mg/dL   LDL Cholesterol (Calc) 36 mg/dL (calc)   Total CHOL/HDL Ratio 2.8 <5.0 (calc)   Non-HDL Cholesterol (Calc) 58 <130 mg/dL (calc)  COMPLETE METABOLIC PANEL WITH GFR  Result Value Ref Range   Glucose, Bld 323 (H) 65 - 99 mg/dL   BUN 26 (H) 7 - 25 mg/dL   Creat 1.57 (H) 0.70 - 1.35 mg/dL   eGFR 47 (L) > OR = 60 mL/min/1.37m   BUN/Creatinine Ratio 17 6 - 22 (calc)   Sodium 135 135 - 146 mmol/L   Potassium 4.5 3.5 - 5.3 mmol/L   Chloride 102 98 - 110 mmol/L   CO2 25 20 - 32 mmol/L   Calcium 9.5 8.6 - 10.3 mg/dL   Total Protein 7.0 6.1 - 8.1 g/dL   Albumin 3.9 3.6 - 5.1 g/dL   Globulin 3.1 1.9 - 3.7 g/dL (calc)   AG Ratio 1.3 1.0 - 2.5 (calc)   Total Bilirubin 0.4 0.2 - 1.2 mg/dL   Alkaline phosphatase (APISO) 73 35 - 144 U/L   AST 15 10 - 35 U/L   ALT 17 9 - 46 U/L  CBC with Differential/Platelet  Result Value Ref Range   WBC 6.2 3.8 - 10.8 Thousand/uL   RBC 4.58 4.20 - 5.80 Million/uL   Hemoglobin 13.1 (L) 13.2 - 17.1 g/dL   HCT 41.6 38.5 - 50.0 %   MCV 90.8 80.0 - 100.0 fL   MCH 28.6 27.0 - 33.0 pg   MCHC 31.5 (L) 32.0 - 36.0 g/dL   RDW 13.6 11.0 - 15.0 %   Platelets 246 140 - 400 Thousand/uL   MPV 10.2 7.5 - 12.5 fL   Neutro Abs 4,210 1,500 - 7,800 cells/uL   Lymphs Abs 1,383 850 - 3,900 cells/uL   Absolute Monocytes 409 200 - 950 cells/uL   Eosinophils Absolute 161 15 - 500 cells/uL   Basophils Absolute 37 0 - 200 cells/uL   Neutrophils Relative % 67.9 %   Total Lymphocyte 22.3 %   Monocytes Relative 6.6 %   Eosinophils Relative 2.6 %   Basophils Relative 0.6 %  Hemoglobin A1c  Result Value Ref Range   Hgb A1c MFr Bld 11.8 (H) <5.7 % of total Hgb   Mean Plasma Glucose 292 mg/dL   eAG (mmol/L) 16.2 mmol/L    Diabetic Foot Exam - Simple   Simple Foot Form Diabetic Foot exam was performed with the following findings: Yes 11/10/2020  2:42  PM  Visual Inspection No deformities, no ulcerations, no other skin breakdown bilaterally: Yes Sensation Testing Intact to touch and monofilament testing bilaterally: Yes Pulse Check Posterior Tibialis and Dorsalis pulse intact bilaterally: Yes Comments       Assessment & Plan:   Problem List Items Addressed This Visit     Type 2 diabetes mellitus with other specified complication ([place]  A1c elevated again to 11.8 Off metformin and poor lifestyle No hypoglycemia Complications - CKD-III nephropathy, other neuropathy from spine but less likely DM neuropathy, other including hyperlipidemia, GERD, obesity - increases risk of future cardiovascular complications  Off Tradjenta, duplicate therapy/cost, Invokana Failed Trulicity 4.12 and 8.7OM weekly d/t GI intolerance OFF Metformin due to AKI - and GI intolerance  Plan:  1. CONTINUE Bydureon BCise 64m weekly inj 2. Increase [name] to 31 units, adjust if CBG fasting >150 by 1 unit weekly  Counseling today that he has issues with Bydureon pen and prefers other option. He currently gets med through [facility] for free. Will consider Rybelsus oral medication GLP1 will review with [facility] Cull[facility]CPP to anticipate titration timing and switch over to med if he agrees. Note considered Ozempic but due to back order / stock issues may not be feasible  Encourage improved lifestyle - low carb, low sugar diet, reduce portion size, continue improving regular exercise Check CBG, bring log to next visit for review Continue ACEi      Hyperlipidemia associated with type 2 diabetes mellitus (HCC)   Elevated PSA, less than 10 ng/ml   Benign hypertension with CKD (chronic kidney disease) stage III (HCC)    Well-controlled HTN Complication with CKD-III - stable to improved Cr [facility] SCandiss Norse  Plan:  1. Continue current BP regimen - Lisinopril 484mdaily 2. Encourage improved lifestyle - low sodium diet, regular exercise improve as  tolerated 3. Continue monitor BP outside office, bring readings to next visit, if persistently >140/90 or new symptoms notify office sooner      Other Visit Diagnoses     Annual physical exam    -  Primary       Updated Health Maintenance information Reviewed recent lab results with patient Encouraged improvement to lifestyle with diet and exercise Goal of weight loss  Elevated PSA at 6 Improved from last check, he has had prior negative prostate biopsy completed by [facility] urology last year. Had infection following but regardless he prefers to monitor PSA here  No orders of the defined types were placed in this encounter.     Follow up plan: Return in about 3 months (around 02/10/2021) for 3 month follow-up DM A1c.  AlNobie PutnamDOBeallsvilleroup 11/10/2020, 2:24 PM

## 2020-11-10 NOTE — Patient Instructions (Addendum)
Thank you for coming to the office today.  We will contact Gentry Fitz to review the medicine options.  Call insurance find cost and coverage of the following  Please look into Rybelsus pill   Rybelsus (pill - oral semaglutide or ozempic) - once daily, taken first thing in morning without other meds  Recent Labs    12/27/19 1329 07/10/20 1007 11/03/20 0813  HGBA1C 8.7* 10.1* 11.8*     Please schedule a Follow-up Appointment to: Return in about 3 months (around 02/10/2021) for 3 month follow-up DM A1c.  If you have any other questions or concerns, please feel free to call the office or send a message through MyChart. You may also schedule an earlier appointment if necessary.  Additionally, you may be receiving a survey about your experience at our office within a few days to 1 week by e-mail or mail. We value your feedback.  Saralyn Pilar, DO [facility], [place]

## 2020-11-11 ENCOUNTER — Ambulatory Visit (INDEPENDENT_AMBULATORY_CARE_PROVIDER_SITE_OTHER): Payer: Medicare HMO | Admitting: Pharmacist

## 2020-11-11 DIAGNOSIS — Z794 Long term (current) use of insulin: Secondary | ICD-10-CM

## 2020-11-11 DIAGNOSIS — E1169 Type 2 diabetes mellitus with other specified complication: Secondary | ICD-10-CM

## 2020-11-11 NOTE — Assessment & Plan Note (Addendum)
Well-controlled HTN Complication with CKD-III - stable to improved Cr Followed by [facility] Neph[facility] Dr Thedore Mins   Plan:  1. Continue current BP regimen - Lisinopril 40mg  daily 2. Encourage improved lifestyle - low sodium diet, regular exercise improve as tolerated 3. Continue monitor BP outside office, bring readings to next visit, if persistently >140/90 or new symptoms notify office sooner

## 2020-11-11 NOTE — Chronic Care Management (AMB) (Signed)
Chronic Care Management Pharmacy Note  11/11/2020 Name:  [name] MRN:  [id number] DOB:  1950/11/22   Subjective: [name] is an 70 y.o. year old male who is a primary patient of [name], DO.  The CCM team was consulted for assistance with disease management and care coordination needs.    Engaged with patient by telephone for follow up visit in response to provider referral for pharmacy case management and/or care coordination services.   Consent to Services:  The patient was given information about Chronic Care Management services, agreed to services, and gave verbal consent prior to initiation of services.  Please see initial visit note for detailed documentation.   Patient Care Team: [name], DO as PCP - General (Family Medicine) [name], MD as PCP - Cardiology (Cardiology) Curley Spice Virl Diamond, RPH-CPP as Pharmacist  Recent office visits: Office Visit with PCP on 8/17 for follow up   Hospital visits: None in previous 6 months  Objective:  Lab Results  Component Value Date   CREATININE 1.57 (H) 11/03/2020   CREATININE 1.59 (H) 12/31/2019   CREATININE 1.65 (H) 12/30/2019    Lab Results  Component Value Date   HGBA1C 11.8 (H) 11/03/2020   Last diabetic Eye exam:  Lab Results  Component Value Date/Time   HMDIABEYEEXA No Retinopathy 03/17/2020 12:00 AM    Last diabetic Foot exam: No results found for: HMDIABFOOTEX      Component Value Date/Time   CHOL 90 11/03/2020 0813   CHOL 212 (H) 11/12/2019 0829   TRIG 141 11/03/2020 0813   HDL 32 (L) 11/03/2020 0813   HDL 32 (L) 11/12/2019 0829   CHOLHDL 2.8 11/03/2020 0813   VLDL 37 12/29/2019 0026   LDLCALC 36 11/03/2020 0813    Hepatic Function Latest Ref Rng & Units 11/03/2020 12/27/2019 11/12/2019  Total Protein 6.1 - 8.1 g/dL 7.0 8.5(H) 7.1  Albumin 3.5 - 5.0 g/dL - 4.3 4.1  AST 10 - 35 U/L _0 ALT 9 - 46 U/L _1 Alk Phosphatase 38 -  126 U/L - 58 77  Total Bilirubin 0.2 - 1.2 mg/dL 0.4 0.6 0.3  Bilirubin, Direct 0.0 - 0.2 mg/dL - <0.1 -    Clinical ASCVD: Yes  The ASCVD Risk score Mikey Bussing DC Jr., et al., 2013) failed to calculate for the following reasons:   The patient has a prior MI or stroke diagnosis    Other: (CHADS2VASc if Afib, PHQ9 if depression, MMRC or CAT for COPD, ACT, DEXA)  Social History   Tobacco Use  Smoking Status Former   Types: Cigars   Quit date: 2000   Years since quitting: 22.6  Smokeless Tobacco Current   Types: Chew  Tobacco Comments   wife states patient never smoked cigarettes, just chewing tobacco   BP Readings from Last 3 Encounters:  11/10/20 107/61  09/18/20 (!) 102/52  07/10/20 (!) 118/52   Pulse Readings from Last 3 Encounters:  11/10/20 78  09/18/20 72  07/10/20 73   Wt Readings from Last 3 Encounters:  11/10/20 230 lb (104.3 kg)  09/18/20 228 lb (103.4 kg)  07/10/20 226 lb 12.8 oz (102.9 kg)    Assessment: Review of patient past medical history, allergies, medications, health status, including review of consultants reports, laboratory and other test data, was performed as part of comprehensive evaluation and provision of chronic care management services.   SDOH:  (Social Determinants of Health) assessments and interventions performed:  CCM Care Plan  Allergies  Allergen Reactions   Crestor [Rosuvastatin Calcium] Other (See Comments)    myalgia   Invokana [Canagliflozin] Other (See Comments)    dehydrated   Prednisone Other (See Comments)   Sulfamethizole     Medications Reviewed Today     Reviewed by [name], RPH-CPP (Pharmacist) on 11/11/20 at 1114  Med List Status: <None>   Medication Order Taking? Sig Documenting Provider Last Dose Status Informant  ACCU-CHEK SMARTVIEW test strip [id number]  USE 1 STRIP TO CHECK GLUCOSE TWICE DAILY Karamalegos, [name], DO  Active   acetaminophen (TYLENOL) 500 MG tablet [id number]  Take 1,000 mg by  mouth every 8 (eight) hours as needed.  [provider]  Active Spouse/Significant Other  allopurinol (ZYLOPRIM) 100 MG tablet [id number]  Take 1 tablet (100 mg total) by mouth at bedtime. [name], DO  Active   aspirin EC 81 MG EC tablet [id number]  Take 1 tablet (81 mg total) by mouth daily. Swallow whole. [name], MD  Active   atorvastatin (LIPITOR) 10 MG tablet [id number]  Take 1 tablet (10 mg total) by mouth daily. [name], DO  Active   BRILINTA 90 MG TABS tablet [id number]  TAKE 1 TABLET (90 MG TOTAL) BY MOUTH 2 (TWO) TIMES DAILY. [name], DO  Active   BYDUREON BCISE 2 MG/0.85ML [place] [id number] Yes INJECT 2 MG SUBCUTANEOUSLY ONCE A WEEK  Patient taking differently: Inject 2 mg into the skin once a week.   [name], DO Taking Active            Med Note [facility], Denelle Capurro A   Fri May 29, 2020  9:07 AM) On Tuesdays  calcium carbonate (TUMS - DOSED IN MG ELEMENTAL CALCIUM) 500 MG chewable tablet [id number]  Chew 2 tablets by mouth at bedtime as needed for indigestion or heartburn. [provider]  Active   carvedilol (COREG) 12.5 MG tablet [id number]  Take 1 tablet (12.5 mg total) by mouth 2 (two) times daily. [name], DO  Active   DROPLET PEN NEEDLES 32G X 4 MM MISC [id number]  USE WITH [name] INSULIN INJECTION DAILY AS INSTRUCTED [name], DO  Active   ezetimibe (ZETIA) 10 MG tablet [id number]  TAKE 1 TABLET (10 MG TOTAL) BY MOUTH DAILY. Karamalegos, [name], DO  Active   fluticasone (FLONASE) 50 MCG/ACT nasal spray [id number]  USE 2 SPRAYS IN EACH NOSTRIL ONCE DAILY USE FOR 4 TO 6 WEEKS, THEN STOP AND USE SEASONALLY OR AS NEEDED. Karamalegos, [name], DO  Active   gabapentin (NEURONTIN) 100 MG capsule [id number]  Start 1 capsule daily, increase by 1 cap every 2-3 days as tolerated up to 3 times a day, or may take 3 at once in evening. [name], DO  Active    Insulin Glargine [facility] KWIKPEN) 100 UNIT/ML [id number] Yes Inject 32 Units into the skin at bedtime. [name], DO Taking Active   lisinopril (ZESTRIL) 40 MG tablet [id number]  Take 1 tablet (40 mg total) by mouth daily. Karamalegos, [name], DO  Active   nitroGLYCERIN (NITROSTAT) 0.4 MG SL tablet [id number]  Place 1 tablet (0.4 mg total) under the tongue every 5 (five) minutes as needed for chest pain. [name], MD  Active   omeprazole (PRILOSEC) 20 MG capsule [id number]  Take 1 capsule (20 mg total) by mouth daily before breakfast. [name], DO  Active  Patient Active Problem List   Diagnosis Date Noted   Hx of gout    Elevated PSA, less than 10 ng/ml 10/03/2017   Lumbar radiculopathy 10/03/2017   Drug-induced myopathy 10/03/2017   Obesity (BMI 30.0-34.9) 10/03/2017   History of pulmonary embolus (PE) 12/05/2016   Chronic pain of left knee 10/07/2016   CKD stage 3 due to type 2 diabetes mellitus ([place]) 10/07/2016   Muscle strain 11/17/2015   Low back pain 10/08/2015   Hx of hematuria 07/16/2015   Ganglion cyst of left foot 01/23/2015   Smokeless tobacco use 01/12/2015   Type 2 diabetes mellitus with other specified complication ([place]) 46/27/0350   Benign hypertension with CKD (chronic kidney disease) stage III ([place]) 10/16/2014   Hyperlipidemia associated with type 2 diabetes mellitus ([place]) 10/16/2014   Gout 10/16/2014   GERD without esophagitis 10/16/2014   Injury of tendon of upper extremity 05/29/2014   Glenoid labral tear 05/29/2014   Complete rotator cuff rupture of left shoulder 05/09/2014   Infraspinatus tenosynovitis 05/09/2014   Other synovitis and tenosynovitis, right shoulder 05/09/2014    Immunization History  Administered Date(s) Administered   PFIZER(Purple Top)SARS-COV-2 Vaccination 05/15/2019, 06/05/2019   Tdap 07/25/2016    Conditions to be addressed/monitored: HTN, HLD, and DMII  Care Plan : PharmD  - Medication Management  Updates made by [name], RPH-CPP since 11/11/2020 12:00 AM     Problem: Disease Progression      Long-Range Goal: Disease Progression Prevented or Minimized   Start Date: 04/22/2020  Expected End Date: 07/21/2020  Recent Progress: On track  Priority: High  Note:   Current Barriers:  Financial Barriers - patient has [employer] and reports copay for Bydureon & insulin glargine is cost prohibitive at this time Patient APPROVED for patient assistance for [name] from [employer] through through 03/27/2021 Patient APPROVED for patient assistance for Bydureon from [name] through 03/27/2021 Lack of blood sugar or blood pressure readings for clinical team  Pharmacist Clinical Goal(s):  Over the next 90 days, patient will achieve adherence to monitoring guidelines and medication adherence to achieve therapeutic efficacy through collaboration with PharmD and provider.   Interventions: 1:1 collaboration with [name], DO regarding development and update of comprehensive plan of care as evidenced by provider attestation and co-signature Inter-disciplinary care team collaboration (see longitudinal plan of care) Receive message from PCP/perform chart review. Office Visit with PCP on 8/16 A1C increased to 11.8% Provider and patient discussed patient having difficulty with Bydureon injections and plan to switch to oral Rybelsus Patient to continue [name] insulin and checking daily fasting blood sugar. Patient to increase [name] dose by 1 unit weekly if fasting readings consistently >150  Type 2 Diabetes: Uncontrolled; current treatment: Bydureon 2 mg weekly on Tuesdays [name] 32 units QHS  Previous therapies tried: Trulicity (GI intolerance) Metformin (unable to tolerate - GI side effects) Unable to review specific blood sugar readings today as patient not home Report blood sugar this morning: 220 Today patient expresses interest  in making switch from Bydureon to Rybelsus due to recent difficulty with Bydureon injection/switch to oral dosage form Patient requests assistance with patient assistance program enrollment (see below) Until able to receive Rybelsus, encourage patient to continue using Bydureon weekly according to administration directions and rotating injection site Have encouraged patient to work on positive dietary changes to have well-balanced meals and reduce carbohydrate portion sizes Encourage patient to continue to check fasting CBG readings daily and increase [name] dose, as directed by PCP by  1 unit weekly if fasting readings consistently >150  Medication Assistance: Based on reported income, patient meets requirements for [employer] patient assistance program Will collaborate with [place] to request she aid patient with application to receive Rybelsus from [employer] patient assistance program  Patient Goals/Self-Care Activities Over the next 90 days, patient will:  - take medications as directed  Patient uses weekly pillbox to manage medications - check glucose, document, and provide at future appointments - check blood pressure, document, and provide at future appointments - attend medical appointments as scheduled  Follow Up Plan: Telephone follow up appointment with care management team member scheduled for: 11/23/2020 at 8:30 AM      Patient's preferred pharmacy is:  [facility] [address], [place] - [place] [address] Phone: [phone] Fax: [facility] ([facility] - [place], [place] [address] Phone: [phone] Fax: [phone]   Follow Up:  Patient agrees to Care Plan and Follow-up.  Wallace Cullens, PharmD, Para [place], CPP Clinical Pharmacist [facility] [phone]

## 2020-11-11 NOTE — Assessment & Plan Note (Signed)
A1c elevated again to 11.8 Off metformin and poor lifestyle No hypoglycemia Complications - CKD-III nephropathy, other neuropathy from spine but less likely DM neuropathy, other including hyperlipidemia, GERD, obesity - increases risk of future cardiovascular complications  Off Tradjenta, duplicate therapy/cost, Invokana Failed Trulicity 0.75 and 1.5mg  weekly d/t GI intolerance OFF Metformin due to AKI - and GI intolerance  Plan:  1. CONTINUE Bydureon BCise 2mg  weekly inj 2. Increase [name] to 31 units, adjust if CBG fasting >150 by 1 unit weekly  Counseling today that he has issues with Bydureon pen and prefers other option. He currently gets med through [facility] for free. Will consider Rybelsus oral medication GLP1 will review with CPP to anticipate titration timing and switch over to med if he agrees. Note considered Ozempic but due to back order / stock issues may not be feasible  Encourage improved lifestyle - low carb, low sugar diet, reduce portion size, continue improving regular exercise Check CBG, bring log to next visit for review Continue ACEi

## 2020-11-11 NOTE — Patient Instructions (Signed)
Visit Information  PATIENT GOALS:  Goals Addressed             This Visit's Progress    Pharmacy - Patient Goals       Our goal A1c is less than 7%. This corresponds with fasting sugars less than 130 and 2 hour after meal sugars less than 180. Please check your fasting blood sugar each morning and keep a record of the results  Please check your blood pressure and keep record of the results  Our goal bad cholesterol, or LDL, is less than 70 . This is why it is important to continue taking your atorvastatin and ezetimibe  Feel free to call me with any questions or concerns. I look forward to our next call   Estelle Grumbles, PharmD, BCACP Clinical Pharmacist [facility] [phone]          The patient verbalized understanding of instructions, educational materials, and care plan provided today and declined offer to receive copy of patient instructions, educational materials, and care plan.   Telephone follow up appointment with care management team member scheduled for: 11/23/2020 at 8:30 AM

## 2020-11-12 ENCOUNTER — Telehealth: Payer: Self-pay | Admitting: Pharmacy Technician

## 2020-11-12 DIAGNOSIS — Z596 Low income: Secondary | ICD-10-CM

## 2020-11-12 NOTE — Progress Notes (Signed)
[facility] Customer service manager [facility])                                            [facility] Quality Pharmacy Team                                                                         Medication Assistance Referral  Referral From: [facility] Embedded RPh Vallery Sa   Medication/Company: Rybelsus / [employer] Patient application portion:  Mining engineer portion: Faxed  to Dr. Saralyn Pilar Provider address/fax verified via: Office website  [name] P. Josuel Koeppen, CPhT [facility] [employer]  [phone]

## 2020-11-23 ENCOUNTER — Ambulatory Visit: Payer: Medicare HMO | Admitting: Pharmacist

## 2020-11-23 DIAGNOSIS — I129 Hypertensive chronic kidney disease with stage 1 through stage 4 chronic kidney disease, or unspecified chronic kidney disease: Secondary | ICD-10-CM

## 2020-11-23 DIAGNOSIS — E785 Hyperlipidemia, unspecified: Secondary | ICD-10-CM

## 2020-11-23 DIAGNOSIS — N183 Chronic kidney disease, stage 3 unspecified: Secondary | ICD-10-CM

## 2020-11-23 DIAGNOSIS — E1169 Type 2 diabetes mellitus with other specified complication: Secondary | ICD-10-CM

## 2020-11-23 NOTE — Chronic Care Management (AMB) (Signed)
Chronic Care Management Pharmacy Note  11/23/2020 Name:  [name] MRN:  [id number] DOB:  [birth year]   Subjective: [name] is an 70 y.o. year old male who is a primary patient of [name], DO.  The CCM team was consulted for assistance with disease management and care coordination needs.    Engaged with patient by telephone for follow up visit in response to provider referral for pharmacy case management and/or care coordination services.   Consent to Services:  The patient was given information about Chronic Care Management services, agreed to services, and gave verbal consent prior to initiation of services.  Please see initial visit note for detailed documentation.   Patient Care Team: [name], DO as PCP - General (Family Medicine) [name], MD as PCP - Cardiology (Cardiology) Curley Spice Virl Diamond, RPH-CPP as Pharmacist  Recent office visits: None  Hospital visits: None in previous 6 months  Objective:  Lab Results  Component Value Date   CREATININE 1.57 (H) 11/03/2020   CREATININE 1.59 (H) 12/31/2019   CREATININE 1.65 (H) 12/30/2019    Lab Results  Component Value Date   HGBA1C 11.8 (H) 11/03/2020   Last diabetic Eye exam:  Lab Results  Component Value Date/Time   HMDIABEYEEXA No Retinopathy 03/17/2020 12:00 AM    Last diabetic Foot exam: No results found for: HMDIABFOOTEX      Component Value Date/Time   CHOL 90 11/03/2020 0813   CHOL 212 (H) 11/12/2019 0829   TRIG 141 11/03/2020 0813   HDL 32 (L) 11/03/2020 0813   HDL 32 (L) 11/12/2019 0829   CHOLHDL 2.8 11/03/2020 0813   VLDL 37 12/29/2019 0026   LDLCALC 36 11/03/2020 0813    Hepatic Function Latest Ref Rng & Units 11/03/2020 12/27/2019 11/12/2019  Total Protein 6.1 - 8.1 g/dL 7.0 8.5(H) 7.1  Albumin 3.5 - 5.0 g/dL - 4.3 4.1  AST 10 - 35 U/L _0 ALT 9 - 46 U/L _1 Alk Phosphatase 38 - 126 U/L - 58 77  Total Bilirubin 0.2 -  1.2 mg/dL 0.4 0.6 0.3  Bilirubin, Direct 0.0 - 0.2 mg/dL - <0.1 -   Clinical ASCVD: Yes  The ASCVD Risk score Mikey Bussing DC Jr., et al., 2013) failed to calculate for the following reasons:   The patient has a prior MI or stroke diagnosis     Social History   Tobacco Use  Smoking Status Former   Types: Cigars   Quit date: 2000   Years since quitting: 22.6  Smokeless Tobacco Current   Types: Chew  Tobacco Comments   wife states patient never smoked cigarettes, just chewing tobacco   BP Readings from Last 3 Encounters:  11/10/20 107/61  09/18/20 (!) 102/52  07/10/20 (!) 118/52   Pulse Readings from Last 3 Encounters:  11/10/20 78  09/18/20 72  07/10/20 73   Wt Readings from Last 3 Encounters:  11/10/20 230 lb (104.3 kg)  09/18/20 228 lb (103.4 kg)  07/10/20 226 lb 12.8 oz (102.9 kg)    Assessment: Review of patient past medical history, allergies, medications, health status, including review of consultants reports, laboratory and other test data, was performed as part of comprehensive evaluation and provision of chronic care management services.   SDOH:  (Social Determinants of Health) assessments and interventions performed: yes   CCM Care Plan  Allergies  Allergen Reactions   Crestor [Rosuvastatin Calcium] Other (See Comments)    myalgia  Invokana [Canagliflozin] Other (See Comments)    dehydrated   Prednisone Other (See Comments)   Sulfamethizole     Medications Reviewed Today     Reviewed by [name], RPH-CPP (Pharmacist) on 11/11/20 at 1114  Med List Status: <None>   Medication Order Taking? Sig Documenting Provider Last Dose Status Informant  ACCU-CHEK SMARTVIEW test strip [id number]  USE 1 STRIP TO CHECK GLUCOSE TWICE DAILY Karamalegos, [name], DO  Active   acetaminophen (TYLENOL) 500 MG tablet [id number]  Take 1,000 mg by mouth every 8 (eight) hours as needed.  [provider]  Active Spouse/Significant Other  allopurinol (ZYLOPRIM)  100 MG tablet [id number]  Take 1 tablet (100 mg total) by mouth at bedtime. [name], DO  Active   aspirin EC 81 MG EC tablet [id number]  Take 1 tablet (81 mg total) by mouth daily. Swallow whole. [name], MD  Active   atorvastatin (LIPITOR) 10 MG tablet [id number]  Take 1 tablet (10 mg total) by mouth daily. [name], DO  Active   BRILINTA 90 MG TABS tablet [id number]  TAKE 1 TABLET (90 MG TOTAL) BY MOUTH 2 (TWO) TIMES DAILY. [name], DO  Active   BYDUREON BCISE 2 MG/0.85ML [place] [id number] Yes INJECT 2 MG SUBCUTANEOUSLY ONCE A WEEK  Patient taking differently: Inject 2 mg into the skin once a week.   [name], DO Taking Active            Med Note [facility], Derwin Reddy A   Fri May 29, 2020  9:07 AM) On Tuesdays  calcium carbonate (TUMS - DOSED IN MG ELEMENTAL CALCIUM) 500 MG chewable tablet [id number]  Chew 2 tablets by mouth at bedtime as needed for indigestion or heartburn. [provider]  Active   carvedilol (COREG) 12.5 MG tablet [id number]  Take 1 tablet (12.5 mg total) by mouth 2 (two) times daily. [name], DO  Active   DROPLET PEN NEEDLES 32G X 4 MM MISC [id number]  USE WITH [name] INSULIN INJECTION DAILY AS INSTRUCTED [name], DO  Active   ezetimibe (ZETIA) 10 MG tablet [id number]  TAKE 1 TABLET (10 MG TOTAL) BY MOUTH DAILY. Karamalegos, [name], DO  Active   fluticasone (FLONASE) 50 MCG/ACT nasal spray [id number]  USE 2 SPRAYS IN EACH NOSTRIL ONCE DAILY USE FOR 4 TO 6 WEEKS, THEN STOP AND USE SEASONALLY OR AS NEEDED. Karamalegos, [name], DO  Active   gabapentin (NEURONTIN) 100 MG capsule [id number]  Start 1 capsule daily, increase by 1 cap every 2-3 days as tolerated up to 3 times a day, or may take 3 at once in evening. [name], DO  Active   Insulin Glargine [facility] KWIKPEN) 100 UNIT/ML [id number] Yes Inject 32 Units into the skin at bedtime. [name], DO Taking Active   lisinopril (ZESTRIL) 40 MG tablet [id number]  Take 1 tablet (40 mg total) by mouth daily. Karamalegos, [name], DO  Active   nitroGLYCERIN (NITROSTAT) 0.4 MG SL tablet [id number]  Place 1 tablet (0.4 mg total) under the tongue every 5 (five) minutes as needed for chest pain. [name], MD  Active   omeprazole (PRILOSEC) 20 MG capsule [id number]  Take 1 capsule (20 mg total) by mouth daily before breakfast. [name], DO  Active             Patient Active Problem List   Diagnosis Date Noted   Hx of gout  Elevated PSA, less than 10 ng/ml 10/03/2017   Lumbar radiculopathy 10/03/2017   Drug-induced myopathy 10/03/2017   Obesity (BMI 30.0-34.9) 10/03/2017   History of pulmonary embolus (PE) 12/05/2016   Chronic pain of left knee 10/07/2016   CKD stage 3 due to type 2 diabetes mellitus ([place]) 10/07/2016   Muscle strain 11/17/2015   Low back pain 10/08/2015   Hx of hematuria 07/16/2015   Ganglion cyst of left foot 01/23/2015   Smokeless tobacco use 01/12/2015   Type 2 diabetes mellitus with other specified complication ([place]) 19/14/7829   Benign hypertension with CKD (chronic kidney disease) stage III ([place]) 10/16/2014   Hyperlipidemia associated with type 2 diabetes mellitus ([place]) 10/16/2014   Gout 10/16/2014   GERD without esophagitis 10/16/2014   Injury of tendon of upper extremity 05/29/2014   Glenoid labral tear 05/29/2014   Complete rotator cuff rupture of left shoulder 05/09/2014   Infraspinatus tenosynovitis 05/09/2014   Other synovitis and tenosynovitis, right shoulder 05/09/2014    Immunization History  Administered Date(s) Administered   PFIZER(Purple Top)SARS-COV-2 Vaccination 05/15/2019, 06/05/2019   Tdap 07/25/2016    Conditions to be addressed/monitored: HTN, HLD, and DMII  Care Plan : PharmD - Medication Management  Updates made by [name], RPH-CPP since 11/23/2020 12:00 AM     Problem:  Disease Progression      Long-Range Goal: Disease Progression Prevented or Minimized   Start Date: 04/22/2020  Expected End Date: 07/21/2020  This Visit's Progress: On track  Recent Progress: On track  Priority: High  Note:   Current Barriers:  Financial Barriers - patient has [employer] and reports copay for Bydureon & insulin glargine is cost prohibitive at this time Patient APPROVED for patient assistance for [name] from [place] through through 03/27/2021 Patient APPROVED for patient assistance for Bydureon from [name] through 03/27/2021 Lack of blood sugar or blood pressure readings for clinical team  Pharmacist Clinical Goal(s):  Over the next 90 days, patient will achieve adherence to monitoring guidelines and medication adherence to achieve therapeutic efficacy through collaboration with PharmD and provider.   Interventions: 1:1 collaboration with [name], DO regarding development and update of comprehensive plan of care as evidenced by provider attestation and co-signature Inter-disciplinary care team collaboration (see longitudinal plan of care)  Type 2 Diabetes: Uncontrolled; current treatment: Bydureon 2 mg weekly on Tuesdays [name] 32 units QHS  Previous therapies tried: Trulicity (GI intolerance) Metformin (unable to tolerate - GI side effects) Recent home blood sugar readings:  Morning Blood Sugar  23 - August -  24 - August 137  25 - August -  26 - August 140  27 - August 157  28 - August 140  29 - August 163   Current plan: to switch patient from Bydureon to Rybelsus due to recent difficulty with Bydureon injection/switch to oral dosage form Waiting on patient assistance program enrollment (see below) Until able to receive Rybelsus, patient to continue Bydureon weekly according to administration directions and rotating injection site Encourage patient to work on positive dietary changes to have well-balanced meals and reduce  carbohydrate portion sizes Attributes recent improvement in blood sugar readings to reducing evening snacking Encourage patient to continue to check fasting CBG readings daily and follow directions for [name] dose adjustment if needed (1 unit weekly if fasting readings consistently >150)  Medication Assistance: Collaborating with [place] for aid to patient with application to receive Rybelsus from [employer] patient assistance program Patient's wife reports received application  in mail and has completed and mailed back to [place]  Hypertension: Controlled; current treatment: Carvedilol 12.5 mg twice daily Lisinopril 40 mg daily  Reports BP today: 137/70, HR 69 Encourage patient to continue to monitor home blood pressure, keep log of results and have to review at upcoming appointments   Hyperlipidemia: Controlled; current treatment: Ezetimibe 10 mg daily Atorvastatin 10 mg daily Previous therapies tried: atorvastatin 40 mg daily (unable to tolerate due to myalgia) Have counseled on importance of cholesterol control and dietary considerations to help improve cholesterol and counsel to review nutrition labels   Patient Goals/Self-Care Activities Over the next 90 days, patient will:  - take medications as directed  Patient uses weekly pillbox to manage medications - check glucose, document, and provide at future appointments - check blood pressure, document, and provide at future appointments - attend medical appointments as scheduled  Follow Up Plan: Telephone follow up appointment with care management team member scheduled for: 9/14 at 8:30 am      Patient's preferred pharmacy is:  [facility] [address] [place] - [place] [address] Phone: [phone] Fax: [facility] ([facility] - [place], [place]  [address] Phone: [phone] Fax: [phone]   Follow Up:  Patient agrees to Care Plan and Follow-up.  Wallace Cullens, PharmD, Para [place], CPP Clinical Pharmacist [facility] [phone]

## 2020-11-23 NOTE — Patient Instructions (Signed)
Visit Information  PATIENT GOALS:  Goals Addressed             This Visit's Progress    Pharmacy - Patient Goals       Our goal A1c is less than 7%. This corresponds with fasting sugars less than 130 and 2 hour after meal sugars less than 180. Please check your fasting blood sugar each morning and keep a record of the results  Please check your blood pressure and keep record of the results  Our goal bad cholesterol, or LDL, is less than 70 . This is why it is important to continue taking your atorvastatin and ezetimibe  Feel free to call me with any questions or concerns. I look forward to our next call  Estelle Grumbles, PharmD, BCACP Clinical Pharmacist [facility] [phone]          The patient verbalized understanding of instructions, educational materials, and care plan provided today and declined offer to receive copy of patient instructions, educational materials, and care plan.   Telephone follow up appointment with care management team member scheduled for: 9/14 at 8:30 am

## 2020-11-24 ENCOUNTER — Telehealth: Payer: Self-pay | Admitting: Pharmacy Technician

## 2020-11-24 NOTE — Progress Notes (Signed)
[facility])                                            [facility] Quality Pharmacy Team    11/24/2020  Tishawn Friedhoff 04-08-1950 [id number]  Received both patient and provider portion(s) of patient assistance application(s) for Rybelsus. Faxed completed application and required documents into [employer].    Kaylina Cahue P. Alexee Delsanto, CPhT [facility] [employer]  [phone]

## 2020-11-25 DIAGNOSIS — I129 Hypertensive chronic kidney disease with stage 1 through stage 4 chronic kidney disease, or unspecified chronic kidney disease: Secondary | ICD-10-CM | POA: Diagnosis not present

## 2020-11-25 DIAGNOSIS — E1169 Type 2 diabetes mellitus with other specified complication: Secondary | ICD-10-CM

## 2020-11-25 DIAGNOSIS — E785 Hyperlipidemia, unspecified: Secondary | ICD-10-CM | POA: Diagnosis not present

## 2020-11-25 DIAGNOSIS — Z794 Long term (current) use of insulin: Secondary | ICD-10-CM

## 2020-11-25 DIAGNOSIS — N183 Chronic kidney disease, stage 3 unspecified: Secondary | ICD-10-CM

## 2020-11-26 ENCOUNTER — Telehealth: Payer: Self-pay | Admitting: Pharmacy Technician

## 2020-11-26 DIAGNOSIS — Z596 Low income: Secondary | ICD-10-CM

## 2020-11-26 NOTE — Progress Notes (Signed)
[facility])                                            [facility] Quality Pharmacy Team    11/26/2020  Kalijah Zeiss [birth year] [id number]  Care coordination call placed to [employer] in regards to Rybelsus application.  Spoke to [place] who informed patient was APPROVED 11/24/20-03/27/21. She informed medication would be delivered to the provider's office in the next 10-14 business days.  Lorel Lembo P. Sanaai Doane, CPhT [facility] [employer]  [phone]

## 2020-12-01 DIAGNOSIS — E1122 Type 2 diabetes mellitus with diabetic chronic kidney disease: Secondary | ICD-10-CM | POA: Insufficient documentation

## 2020-12-01 DIAGNOSIS — N1831 Chronic kidney disease, stage 3a: Secondary | ICD-10-CM | POA: Diagnosis not present

## 2020-12-01 DIAGNOSIS — I10 Essential (primary) hypertension: Secondary | ICD-10-CM | POA: Diagnosis not present

## 2020-12-01 DIAGNOSIS — E785 Hyperlipidemia, unspecified: Secondary | ICD-10-CM | POA: Insufficient documentation

## 2020-12-07 ENCOUNTER — Ambulatory Visit: Payer: Medicare HMO | Admitting: Pharmacist

## 2020-12-07 DIAGNOSIS — E1169 Type 2 diabetes mellitus with other specified complication: Secondary | ICD-10-CM

## 2020-12-07 MED ORDER — BASAGLAR KWIKPEN 100 UNIT/ML ~~LOC~~ SOPN: 32.0000 [IU] | PEN_INJECTOR | Freq: Every day | SUBCUTANEOUS | 0 refills | Status: DC

## 2020-12-07 NOTE — Patient Instructions (Signed)
Visit Information  PATIENT GOALS:  Goals Addressed             This Visit's Progress    Pharmacy - Patient Goals       Our goal A1c is less than 7%. This corresponds with fasting sugars less than 130 and 2 hour after meal sugars less than 180. Please check your fasting blood sugar each morning and keep a record of the results  Please check your blood pressure and keep record of the results  Our goal bad cholesterol, or LDL, is less than 70 . This is why it is important to continue taking your atorvastatin and ezetimibe  Feel free to call me with any questions or concerns. I look forward to our next call   Estelle Grumbles, PharmD, BCACP Clinical Pharmacist [facility] [phone]          Spouse verbalized understanding of instructions, educational materials, and care plan provided today and declined offer to receive copy of patient instructions, educational materials, and care plan.   Telephone follow up appointment with care management team member scheduled for: 9/14 at 8:30 am

## 2020-12-07 NOTE — Chronic Care Management (AMB) (Signed)
Chronic Care Management Pharmacy Note  12/07/2020 Name:  [name] MRN:  [id number] DOB:  Jun 01, 1950   Subjective: [name] is an 70 y.o. year old male who is a primary patient of [name], DO.  The CCM team was consulted for assistance with disease management and care coordination needs.    Receive message from patient's spouse.  Engaged with spouse by telephone for follow up visit in response to provider referral for pharmacy case management and/or care coordination services.   Consent to Services:  The patient was given information about Chronic Care Management services, agreed to services, and gave verbal consent prior to initiation of services.  Please see initial visit note for detailed documentation.   Patient Care Team: [name], DO as PCP - General (Family Medicine) Debbe Odea, MD as PCP - Cardiology (Cardiology) Ronney Asters Jackelyn Poling, RPH-CPP as Pharmacist   Objective:  Lab Results  Component Value Date   CREATININE 1.57 (H) 11/03/2020   CREATININE 1.59 (H) 12/31/2019   CREATININE 1.65 (H) 12/30/2019    Lab Results  Component Value Date   HGBA1C 11.8 (H) 11/03/2020   Last diabetic Eye exam:  Lab Results  Component Value Date/Time   HMDIABEYEEXA No Retinopathy 03/17/2020 12:00 AM    Last diabetic Foot exam: No results found for: HMDIABFOOTEX   Social History   Tobacco Use  Smoking Status Former   Types: Cigars   Quit date: 2000   Years since quitting: 22.7  Smokeless Tobacco Current   Types: Chew  Tobacco Comments   wife states patient never smoked cigarettes, just chewing tobacco   BP Readings from Last 3 Encounters:  11/10/20 107/61  09/18/20 (!) 102/52  07/10/20 (!) 118/52   Pulse Readings from Last 3 Encounters:  11/10/20 78  09/18/20 72  07/10/20 73   Wt Readings from Last 3 Encounters:  11/10/20 230 lb (104.3 kg)  09/18/20 228 lb (103.4 kg)  07/10/20 226 lb 12.8 oz (102.9 kg)     Assessment: Review of patient past medical history, allergies, medications, health status, including review of consultants reports, laboratory and other test data, was performed as part of comprehensive evaluation and provision of chronic care management services.   SDOH:  (Social Determinants of Health) assessments and interventions performed: none   CCM Care Plan  Allergies  Allergen Reactions   Crestor [Rosuvastatin Calcium] Other (See Comments)    myalgia   Invokana [Canagliflozin] Other (See Comments)    dehydrated   Prednisone Other (See Comments)   Sulfamethizole     Medications Reviewed Today     Reviewed by Manuela Neptune, RPH-CPP (Pharmacist) on 11/11/20 at 1114  Med List Status: <None>   Medication Order Taking? Sig Documenting Provider Last Dose Status Informant  ACCU-CHEK SMARTVIEW test strip [id number]  USE 1 STRIP TO CHECK GLUCOSE TWICE DAILY Karamalegos, Netta Neat, DO  Active   acetaminophen (TYLENOL) 500 MG tablet [id number]  Take 1,000 mg by mouth every 8 (eight) hours as needed.  [provider]  Active Spouse/Significant Other  allopurinol (ZYLOPRIM) 100 MG tablet [id number]  Take 1 tablet (100 mg total) by mouth at bedtime. [name], DO  Active   aspirin EC 81 MG EC tablet [id number]  Take 1 tablet (81 mg total) by mouth daily. Swallow whole. Alford Highland, MD  Active   atorvastatin (LIPITOR) 10 MG tablet [id number]  Take 1 tablet (10 mg total) by mouth daily. [name], DO  Active  BRILINTA 90 MG TABS tablet [id number]  TAKE 1 TABLET (90 MG TOTAL) BY MOUTH 2 (TWO) TIMES DAILY. [name], DO  Active   BYDUREON BCISE 2 MG/0.85ML Virl Cagey [id number] Yes INJECT 2 MG SUBCUTANEOUSLY ONCE A WEEK  Patient taking differently: Inject 2 mg into the skin once a week.   [name], DO Taking Active            Med Note [facility], Chenae Brager A   Fri May 29, 2020  9:07 AM) On Tuesdays  calcium carbonate  (TUMS - DOSED IN MG ELEMENTAL CALCIUM) 500 MG chewable tablet [id number]  Chew 2 tablets by mouth at bedtime as needed for indigestion or heartburn. [provider]  Active   carvedilol (COREG) 12.5 MG tablet [id number]  Take 1 tablet (12.5 mg total) by mouth 2 (two) times daily. [name], DO  Active   DROPLET PEN NEEDLES 32G X 4 MM MISC [id number]  USE WITH [name] INSULIN INJECTION DAILY AS INSTRUCTED [name], DO  Active   ezetimibe (ZETIA) 10 MG tablet [id number]  TAKE 1 TABLET (10 MG TOTAL) BY MOUTH DAILY. Karamalegos, Netta Neat, DO  Active   fluticasone (FLONASE) 50 MCG/ACT nasal spray [id number]  USE 2 SPRAYS IN EACH NOSTRIL ONCE DAILY USE FOR 4 TO 6 WEEKS, THEN STOP AND USE SEASONALLY OR AS NEEDED. Karamalegos, Netta Neat, DO  Active   gabapentin (NEURONTIN) 100 MG capsule [id number]  Start 1 capsule daily, increase by 1 cap every 2-3 days as tolerated up to 3 times a day, or may take 3 at once in evening. [name], DO  Active   Insulin Glargine [facility] KWIKPEN) 100 UNIT/ML [id number] Yes Inject 32 Units into the skin at bedtime. [name], DO Taking Active   lisinopril (ZESTRIL) 40 MG tablet [id number]  Take 1 tablet (40 mg total) by mouth daily. Karamalegos, Netta Neat, DO  Active   nitroGLYCERIN (NITROSTAT) 0.4 MG SL tablet [id number]  Place 1 tablet (0.4 mg total) under the tongue every 5 (five) minutes as needed for chest pain. Alford Highland, MD  Active   omeprazole (PRILOSEC) 20 MG capsule [id number]  Take 1 capsule (20 mg total) by mouth daily before breakfast. [name], DO  Active             Patient Active Problem List   Diagnosis Date Noted   Hx of gout    Elevated PSA, less than 10 ng/ml 10/03/2017   Lumbar radiculopathy 10/03/2017   Drug-induced myopathy 10/03/2017   Obesity (BMI 30.0-34.9) 10/03/2017   History of pulmonary embolus (PE) 12/05/2016   Chronic pain of left knee 10/07/2016    CKD stage 3 due to type 2 diabetes mellitus (HCC) 10/07/2016   Muscle strain 11/17/2015   Low back pain 10/08/2015   Hx of hematuria 07/16/2015   Ganglion cyst of left foot 01/23/2015   Smokeless tobacco use 01/12/2015   Type 2 diabetes mellitus with other specified complication (HCC) 10/16/2014   Benign hypertension with CKD (chronic kidney disease) stage III (HCC) 10/16/2014   Hyperlipidemia associated with type 2 diabetes mellitus (HCC) 10/16/2014   Gout 10/16/2014   GERD without esophagitis 10/16/2014   Injury of tendon of upper extremity 05/29/2014   Glenoid labral tear 05/29/2014   Complete rotator cuff rupture of left shoulder 05/09/2014   Infraspinatus tenosynovitis 05/09/2014   Other synovitis and tenosynovitis, right shoulder 05/09/2014    Immunization History  Administered Date(s) Administered   PFIZER(Purple Top)SARS-COV-2 Vaccination  05/15/2019, 06/05/2019   Tdap 07/25/2016    Conditions to be addressed/monitored: HTN, HLD, and DMII  Care Plan : PharmD - Medication Management  Updates made by Manuela Neptune, RPH-CPP since 12/07/2020 12:00 AM     Problem: Disease Progression      Long-Range Goal: Disease Progression Prevented or Minimized   Start Date: 04/22/2020  Expected End Date: 07/21/2020  This Visit's Progress: On track  Recent Progress: On track  Priority: High  Note:   Current Barriers:  Financial Barriers - patient has [employer] and reports copay for Bydureon & insulin glargine is cost prohibitive at this time Patient APPROVED for patient assistance for [name] from [name] through through 03/27/2021 Patient APPROVED for patient assistance for Bydureon from [name] through 03/27/2021 Lack of blood sugar or blood pressure readings for clinical team  Pharmacist Clinical Goal(s):  Over the next 90 days, patient will achieve adherence to monitoring guidelines and medication adherence to achieve therapeutic efficacy through  collaboration with PharmD and provider.   Interventions: 1:1 collaboration with [name], DO regarding development and update of comprehensive plan of care as evidenced by provider attestation and co-signature Inter-disciplinary care team collaboration (see longitudinal plan of care)  Medication Assistance: Receive message from patient's spouse. Requesting updated Rx be sent to [facility] Patient Assistance Program reflecting current directions for [name] insulin, as patient currently injecting 32 units QHS Place call to patient's spouse who reports patient currently on last box of [name] from assistance program CPP sends updated Rx to [facility], dispensing program for [employer] Patient Assistance Program as requested   Patient Goals/Self-Care Activities Over the next 90 days, patient will:  - take medications as directed  Patient uses weekly pillbox to manage medications - check glucose, document, and provide at future appointments - check blood pressure, document, and provide at future appointments - attend medical appointments as scheduled  Follow Up Plan: Telephone follow up appointment with care management team member scheduled for: 9/14 at 8:30 am      Patient's preferred pharmacy is:  [facility] [address] [place] - [address] [place] [address] Phone: [phone] Fax: [phone]  [facility] ([facility]) - [place], [place] - [address] [place] [address] Phone: [phone] Fax: [phone]   Follow Up:  Patient agrees to Care Plan and Follow-up.  Estelle Grumbles, PharmD, Patsy [place], CPP Clinical Pharmacist [facility] [phone]

## 2020-12-09 ENCOUNTER — Ambulatory Visit (INDEPENDENT_AMBULATORY_CARE_PROVIDER_SITE_OTHER): Payer: Medicare HMO | Admitting: Pharmacist

## 2020-12-09 ENCOUNTER — Telehealth: Payer: Self-pay

## 2020-12-09 DIAGNOSIS — E785 Hyperlipidemia, unspecified: Secondary | ICD-10-CM

## 2020-12-09 DIAGNOSIS — Z794 Long term (current) use of insulin: Secondary | ICD-10-CM

## 2020-12-09 DIAGNOSIS — I129 Hypertensive chronic kidney disease with stage 1 through stage 4 chronic kidney disease, or unspecified chronic kidney disease: Secondary | ICD-10-CM

## 2020-12-09 DIAGNOSIS — E1169 Type 2 diabetes mellitus with other specified complication: Secondary | ICD-10-CM

## 2020-12-09 NOTE — Patient Instructions (Signed)
Visit Information  PATIENT GOALS:  Goals Addressed             This Visit's Progress    Pharmacy - Patient Goals       When SWITCHING from Bydureon to Rybelsus: Rybelsus may cause stomach upset, queasiness, or constipation, especially when first starting. This generally improves over time. Call our office if these symptoms occur and worsen, or if you have severe symptoms such as vomiting, diarrhea, or stomach pain.   Our goal A1c is less than 7%. This corresponds with fasting sugars less than 130 and 2 hour after meal sugars less than 180. Please check your fasting blood sugar each morning and keep a record of the results  Please check your blood pressure and keep record of the results  Our goal bad cholesterol, or LDL, is less than 70 . This is why it is important to continue taking your atorvastatin and ezetimibe  Feel free to call me with any questions or concerns. I look forward to our next call  Estelle Grumbles, PharmD, BCACP Clinical Pharmacist [facility] [phone]          The patient verbalized understanding of instructions, educational materials, and care plan provided today and declined offer to receive copy of patient instructions, educational materials, and care plan.   Telephone follow up appointment with care management team member scheduled for: 12/30/2020 at 8:30 AM

## 2020-12-09 NOTE — Chronic Care Management (AMB) (Signed)
Chronic Care Management Pharmacy Note  12/09/2020 Name:  [name] MRN:  [id number] DOB:  Jan 27, 1951   Subjective: [name] is an 70 y.o. year old male who is a primary patient of [name], DO.  The CCM team was consulted for assistance with disease management and care coordination needs.    Engaged with patient by telephone for follow up visit in response to provider referral for pharmacy case management and/or care coordination services.   Consent to Services:  The patient was given information about Chronic Care Management services, agreed to services, and gave verbal consent prior to initiation of services.  Please see initial visit note for detailed documentation.   Patient Care Team: [name], DO as PCP - General (Family Medicine) [name], MD as PCP - Cardiology (Cardiology) Curley Spice, Virl Diamond, RPH-CPP as Pharmacist   Recent consult visits: Office Visit with [facility] on [facility] visits: None in previous 6 months  Objective:  Lab Results  Component Value Date   CREATININE 1.57 (H) 11/03/2020   CREATININE 1.59 (H) 12/31/2019   CREATININE 1.65 (H) 12/30/2019    Lab Results  Component Value Date   HGBA1C 11.8 (H) 11/03/2020   Last diabetic Eye exam:  Lab Results  Component Value Date/Time   HMDIABEYEEXA No Retinopathy 03/17/2020 12:00 AM    Last diabetic Foot exam: No results found for: HMDIABFOOTEX      Component Value Date/Time   CHOL 90 11/03/2020 0813   CHOL 212 (H) 11/12/2019 0829   TRIG 141 11/03/2020 0813   HDL 32 (L) 11/03/2020 0813   HDL 32 (L) 11/12/2019 0829   CHOLHDL 2.8 11/03/2020 0813   VLDL 37 12/29/2019 0026   LDLCALC 36 11/03/2020 0813    Hepatic Function Latest Ref Rng & Units 11/03/2020 12/27/2019 11/12/2019  Total Protein 6.1 - 8.1 g/dL 7.0 8.5(H) 7.1  Albumin 3.5 - 5.0 g/dL - 4.3 4.1  AST 10 - 35 U/L _0 ALT 9 - 46 U/L _1 Alk  Phosphatase 38 - 126 U/L - 58 77  Total Bilirubin 0.2 - 1.2 mg/dL 0.4 0.6 0.3  Bilirubin, Direct 0.0 - 0.2 mg/dL - <0.1 -    Clinical ASCVD: Yes  The ASCVD Risk score (Arnett DK, et al., 2019) failed to calculate for the following reasons:   The patient has a prior MI or stroke diagnosis     Social History   Tobacco Use  Smoking Status Former   Types: Cigars   Quit date: 2000   Years since quitting: 22.7  Smokeless Tobacco Current   Types: Chew  Tobacco Comments   wife states patient never smoked cigarettes, just chewing tobacco   BP Readings from Last 3 Encounters:  11/10/20 107/61  09/18/20 (!) 102/52  07/10/20 (!) 118/52   Pulse Readings from Last 3 Encounters:  11/10/20 78  09/18/20 72  07/10/20 73   Wt Readings from Last 3 Encounters:  11/10/20 230 lb (104.3 kg)  09/18/20 228 lb (103.4 kg)  07/10/20 226 lb 12.8 oz (102.9 kg)    Assessment: Review of patient past medical history, allergies, medications, health status, including review of consultants reports, laboratory and other test data, was performed as part of comprehensive evaluation and provision of chronic care management services.   SDOH:  (Social Determinants of Health) assessments and interventions performed: none   CCM Care Plan  Allergies  Allergen Reactions   Crestor [employer]  Calcium] Other (See Comments)    myalgia   Invokana [Canagliflozin] Other (See Comments)    dehydrated   Prednisone Other (See Comments)   Sulfamethizole     Medications Reviewed Today     Reviewed by [name], RPH-CPP (Pharmacist) on 11/11/20 at 1114  Med List Status: <None>   Medication Order Taking? Sig Documenting Provider Last Dose Status Informant  ACCU-CHEK SMARTVIEW test strip [id number]  USE 1 STRIP TO CHECK GLUCOSE TWICE DAILY Karamalegos, [name], DO  Active   acetaminophen (TYLENOL) 500 MG tablet [id number]  Take 1,000 mg by mouth every 8 (eight) hours as needed.  [provider]   Active Spouse/Significant Other  allopurinol (ZYLOPRIM) 100 MG tablet [id number]  Take 1 tablet (100 mg total) by mouth at bedtime. [name], DO  Active   aspirin EC 81 MG EC tablet [id number]  Take 1 tablet (81 mg total) by mouth daily. Swallow whole. [name], MD  Active   atorvastatin (LIPITOR) 10 MG tablet [id number]  Take 1 tablet (10 mg total) by mouth daily. [name], DO  Active   BRILINTA 90 MG TABS tablet [id number]  TAKE 1 TABLET (90 MG TOTAL) BY MOUTH 2 (TWO) TIMES DAILY. [name], DO  Active   BYDUREON BCISE 2 MG/0.85ML [place] [id number] Yes INJECT 2 MG SUBCUTANEOUSLY ONCE A WEEK  Patient taking differently: Inject 2 mg into the skin once a week.   [name], DO Taking Active            Med Note [facility], Lyle Niblett A   Fri May 29, 2020  9:07 AM) On Tuesdays  calcium carbonate (TUMS - DOSED IN MG ELEMENTAL CALCIUM) 500 MG chewable tablet [id number]  Chew 2 tablets by mouth at bedtime as needed for indigestion or heartburn. [provider]  Active   carvedilol (COREG) 12.5 MG tablet [id number]  Take 1 tablet (12.5 mg total) by mouth 2 (two) times daily. [name], DO  Active   DROPLET PEN NEEDLES 32G X 4 MM MISC [id number]  USE WITH [name] INSULIN INJECTION DAILY AS INSTRUCTED [name], DO  Active   ezetimibe (ZETIA) 10 MG tablet [id number]  TAKE 1 TABLET (10 MG TOTAL) BY MOUTH DAILY. Karamalegos, [name], DO  Active   fluticasone (FLONASE) 50 MCG/ACT nasal spray [id number]  USE 2 SPRAYS IN EACH NOSTRIL ONCE DAILY USE FOR 4 TO 6 WEEKS, THEN STOP AND USE SEASONALLY OR AS NEEDED. Karamalegos, [name], DO  Active   gabapentin (NEURONTIN) 100 MG capsule [id number]  Start 1 capsule daily, increase by 1 cap every 2-3 days as tolerated up to 3 times a day, or may take 3 at once in evening. [name], DO  Active   Insulin Glargine [facility] KWIKPEN) 100 UNIT/ML [id number] Yes  Inject 32 Units into the skin at bedtime. [name], DO Taking Active   lisinopril (ZESTRIL) 40 MG tablet [id number]  Take 1 tablet (40 mg total) by mouth daily. Karamalegos, [name], DO  Active   nitroGLYCERIN (NITROSTAT) 0.4 MG SL tablet [id number]  Place 1 tablet (0.4 mg total) under the tongue every 5 (five) minutes as needed for chest pain. [name], MD  Active   omeprazole (PRILOSEC) 20 MG capsule [id number]  Take 1 capsule (20 mg total) by mouth daily before breakfast. [name], DO  Active             Patient Active Problem List   Diagnosis  Date Noted   Hx of gout    Elevated PSA, less than 10 ng/ml 10/03/2017   Lumbar radiculopathy 10/03/2017   Drug-induced myopathy 10/03/2017   Obesity (BMI 30.0-34.9) 10/03/2017   History of pulmonary embolus (PE) 12/05/2016   Chronic pain of left knee 10/07/2016   CKD stage 3 due to type 2 diabetes mellitus ([place]) 10/07/2016   Muscle strain 11/17/2015   Low back pain 10/08/2015   Hx of hematuria 07/16/2015   Ganglion cyst of left foot 01/23/2015   Smokeless tobacco use 01/12/2015   Type 2 diabetes mellitus with other specified complication ([place]) 27/25/3664   Benign hypertension with CKD (chronic kidney disease) stage III ([place]) 10/16/2014   Hyperlipidemia associated with type 2 diabetes mellitus ([place]) 10/16/2014   Gout 10/16/2014   GERD without esophagitis 10/16/2014   Injury of tendon of upper extremity 05/29/2014   Glenoid labral tear 05/29/2014   Complete rotator cuff rupture of left shoulder 05/09/2014   Infraspinatus tenosynovitis 05/09/2014   Other synovitis and tenosynovitis, right shoulder 05/09/2014    Immunization History  Administered Date(s) Administered   PFIZER(Purple Top)SARS-COV-2 Vaccination 05/15/2019, 06/05/2019   Tdap 07/25/2016    Conditions to be addressed/monitored: HTN, HLD, and DMII  Care Plan : PharmD - Medication Management  Updates made by [name],  RPH-CPP since 12/09/2020 12:00 AM     Problem: Disease Progression      Long-Range Goal: Disease Progression Prevented or Minimized   Start Date: 04/22/2020  Expected End Date: 07/21/2020  This Visit's Progress: On track  Recent Progress: On track  Priority: High  Note:   Current Barriers:  Financial Barriers - patient has [employer] and reports copay for Bydureon & insulin glargine is cost prohibitive at this time Patient APPROVED for patient assistance for [name] from [employer] through through 03/27/2021 Patient APPROVED for patient assistance for Bydureon from [name] through 03/27/2021 Patient APPROVED to receive Rybelsus from [employer] patient assistance program through 03/27/21 Lack of blood sugar or blood pressure readings for clinical team  Pharmacist Clinical Goal(s):  Over the next 90 days, patient will achieve adherence to monitoring guidelines and medication adherence to achieve therapeutic efficacy through collaboration with PharmD and provider.   Interventions: 1:1 collaboration with [name], DO regarding development and update of comprehensive plan of care as evidenced by provider attestation and co-signature Inter-disciplinary care team collaboration (see longitudinal plan of care) Perform chart review. Office Visit with [facility] on 9/6  Type 2 Diabetes: Uncontrolled; current treatment: Bydureon 2 mg weekly on Tuesdays [name] 32 units QHS  Previous therapies tried: Trulicity (GI intolerance) Metformin (unable to tolerate - GI side effects) Recent home blood sugar readings:   Morning Blood Sugar  8 - September 141  9 - September 218  10 - September 170  11 - September -  12 - September 219  13 - September 190  14 - September 184    Attributes higher readings to nights when worked later (larger gap between lunch and supper) and then ate more in the evenings, particularly snacking Encourage patient to  work on positive dietary changes to have well-balanced meals and reduce carbohydrate portion sizes Discuss limiting snacking in evenings and healthier snack options, as well as having meals more regularly throughout the day Reports working on having more non-starchy vegetables with meals and reducing bread consumption Current medication plan: to [facility] patient from Bydureon to Rybelsus 7 mg daily due to recent difficulty with Bydureon injection/switch to  oral dosage form Waiting on medication to arrive from patient assistance program (see below) Until able to receive Rybelsus, patient to continue Bydureon weekly according to administration directions and rotating injection site Counseled patient and spouse on Rybelsus, including administration, side effects, and benefits. No personal or family history of medullary thyroid cancer, personal history of pancreatitis or gallbladder disease. Counseled on potential side effects of nausea, stomach upset, queasiness, constipation, and that these generally improve over time. Advised to contact our office with more severe symptoms, including nausea, diarrhea, stomach pain. Patient verbalized understanding. Counsel patient to take Rybelsus ?30 minutes before the first food, beverage, or other oral medications of the day with ?4 oz of plain water only and then to eat 30-60 minutes after the dose Remind patient to continue to check fasting CBG readings daily and follow directions for [name] dose adjustment if needed (1 unit weekly if fasting readings consistently >150) Discuss benefits of continuous blood glucose monitor and offer to investigate health plan coverage for CBGM Patient declines for now, preferring to wait until after next A1C to discuss again Patient agreeable for now to instead use glucometer to check blood sugar 2 hours after meals (breakfast, lunch or supper) a couple of times/week to use as feedback on impact of dietary choices on blood sugar    Medication Assistance: Per review of notes from [place] on 9/1, patient APPROVED 11/24/20-03/27/21 to receive Rybelsus from [employer] patient assistance program. Medication to be delivered to provider's office within 10-14 business days of 9/1 Follow up with [employer] Patient Assistance Program today.  Speak with representative Deidre Ala. Confirms received Rx for [name] from 9/12 with updated directions, processes Rx while on the phone today and will be delivered to patient on 9/21. Denies sooner delivery available Follow up with patient/wife.  Confirms will be available for delivery Checks current [name] supply. Patient will not have quite enough insulin in pen to last until 9/21 Collaborate with office to provide [name] sample pen to patient. Sample picked up by spouse today   Hypertension: Controlled; current treatment: Carvedilol 12.5 mg twice daily Lisinopril 40 mg daily  Denies checking home BP recently Note BP from Office Visit with Nephrology on 9/6: BP 134/72, HR 72  Encourage patient to monitor home blood pressure, keep log of results and have to review at upcoming appointments   Hyperlipidemia: Controlled; current treatment: Ezetimibe 10 mg daily Atorvastatin 10 mg daily Previous therapies tried: atorvastatin 40 mg daily (unable to tolerate due to myalgia) Have counseled on importance of cholesterol control and dietary considerations to help improve cholesterol and counsel to review nutrition labels  Patient Goals/Self-Care Activities Over the next 90 days, patient will:  - take medications as directed  Patient uses weekly pillbox to manage medications - check glucose, document, and provide at future appointments - check blood pressure, document, and provide at future appointments - attend medical appointments as scheduled  Follow Up Plan: Telephone follow up appointment with care management team member scheduled for: 12/30/2020 at 8:30 AM      Patient's preferred  pharmacy is:  [facility] [address] [place] - [place] [address] Phone: [phone] Fax: [facility] ([facility] - [place], [place] [address] Phone: [phone] Fax: [phone]   Follow Up:  Patient agrees to Care Plan and Follow-up.  Wallace Cullens, PharmD, Para [place], CPP Clinical Pharmacist [place]  [place] [phone]

## 2020-12-25 DIAGNOSIS — I129 Hypertensive chronic kidney disease with stage 1 through stage 4 chronic kidney disease, or unspecified chronic kidney disease: Secondary | ICD-10-CM | POA: Diagnosis not present

## 2020-12-25 DIAGNOSIS — N183 Chronic kidney disease, stage 3 unspecified: Secondary | ICD-10-CM

## 2020-12-25 DIAGNOSIS — E785 Hyperlipidemia, unspecified: Secondary | ICD-10-CM

## 2020-12-25 DIAGNOSIS — Z794 Long term (current) use of insulin: Secondary | ICD-10-CM

## 2020-12-25 DIAGNOSIS — E1169 Type 2 diabetes mellitus with other specified complication: Secondary | ICD-10-CM | POA: Diagnosis not present

## 2020-12-27 ENCOUNTER — Other Ambulatory Visit: Payer: Self-pay | Admitting: Family Medicine

## 2020-12-27 DIAGNOSIS — M1000 Idiopathic gout, unspecified site: Secondary | ICD-10-CM

## 2020-12-27 DIAGNOSIS — I10 Essential (primary) hypertension: Secondary | ICD-10-CM

## 2020-12-27 DIAGNOSIS — K219 Gastro-esophageal reflux disease without esophagitis: Secondary | ICD-10-CM

## 2020-12-30 ENCOUNTER — Ambulatory Visit (INDEPENDENT_AMBULATORY_CARE_PROVIDER_SITE_OTHER): Payer: Medicare HMO | Admitting: Pharmacist

## 2020-12-30 DIAGNOSIS — N183 Chronic kidney disease, stage 3 unspecified: Secondary | ICD-10-CM

## 2020-12-30 DIAGNOSIS — E785 Hyperlipidemia, unspecified: Secondary | ICD-10-CM

## 2020-12-30 DIAGNOSIS — E1169 Type 2 diabetes mellitus with other specified complication: Secondary | ICD-10-CM

## 2020-12-30 DIAGNOSIS — Z794 Long term (current) use of insulin: Secondary | ICD-10-CM

## 2020-12-30 NOTE — Chronic Care Management (AMB) (Signed)
Chronic Care Management Pharmacy Note  12/30/2020 Name:  [name] MRN:  [id number] DOB:  November 26, 1950   Subjective: [name] is an 70 y.o. year old male who is a primary patient of [name], DO.  The CCM team was consulted for assistance with disease management and care coordination needs.    Engaged with patient by telephone for follow up visit in response to provider referral for pharmacy case management and/or care coordination services.   Consent to Services:  The patient was given information about Chronic Care Management services, agreed to services, and gave verbal consent prior to initiation of services.  Please see initial visit note for detailed documentation.   Patient Care Team: [name], DO as PCP - General (Family Medicine) [name], MD as PCP - Cardiology (Cardiology) Curley Spice Virl Diamond, RPH-CPP as Pharmacist   [facility] visits: None in previous 6 months  Objective:  Lab Results  Component Value Date   CREATININE 1.57 (H) 11/03/2020   CREATININE 1.59 (H) 12/31/2019   CREATININE 1.65 (H) 12/30/2019    Lab Results  Component Value Date   HGBA1C 11.8 (H) 11/03/2020   Last diabetic Eye exam:  Lab Results  Component Value Date/Time   HMDIABEYEEXA No Retinopathy 03/17/2020 12:00 AM    Last diabetic Foot exam: No results found for: HMDIABFOOTEX      Component Value Date/Time   CHOL 90 11/03/2020 0813   CHOL 212 (H) 11/12/2019 0829   TRIG 141 11/03/2020 0813   HDL 32 (L) 11/03/2020 0813   HDL 32 (L) 11/12/2019 0829   CHOLHDL 2.8 11/03/2020 0813   VLDL 37 12/29/2019 0026   LDLCALC 36 11/03/2020 0813    Hepatic Function Latest Ref Rng & Units 11/03/2020 12/27/2019 11/12/2019  Total Protein 6.1 - 8.1 g/dL 7.0 8.5(H) 7.1  Albumin 3.5 - 5.0 g/dL - 4.3 4.1  AST 10 - 35 U/L _0 ALT 9 - 46 U/L _1 Alk Phosphatase 38 - 126 U/L - 58 77  Total Bilirubin 0.2 - 1.2 mg/dL 0.4 0.6 0.3   Bilirubin, Direct 0.0 - 0.2 mg/dL - <0.1 -    Clinical ASCVD: Yes  The ASCVD Risk score (Arnett DK, et al., 2019) failed to calculate for the following reasons:   The patient has a prior MI or stroke diagnosis     Social History   Tobacco Use  Smoking Status Former   Types: Cigars   Quit date: 2000   Years since quitting: 22.7  Smokeless Tobacco Current   Types: Chew  Tobacco Comments   wife states patient never smoked cigarettes, just chewing tobacco   BP Readings from Last 3 Encounters:  11/10/20 107/61  09/18/20 (!) 102/52  07/10/20 (!) 118/52   Pulse Readings from Last 3 Encounters:  11/10/20 78  09/18/20 72  07/10/20 73   Wt Readings from Last 3 Encounters:  11/10/20 230 lb (104.3 kg)  09/18/20 228 lb (103.4 kg)  07/10/20 226 lb 12.8 oz (102.9 kg)    Assessment: Review of patient past medical history, allergies, medications, health status, including review of consultants reports, laboratory and other test data, was performed as part of comprehensive evaluation and provision of chronic care management services.   SDOH:  (Social Determinants of Health) assessments and interventions performed:    CCM Care Plan  Allergies  Allergen Reactions   Crestor [Rosuvastatin Calcium] Other (See Comments)    myalgia   Invokana [Canagliflozin] Other (  See Comments)    dehydrated   Prednisone Other (See Comments)   Sulfamethizole     Medications Reviewed Today     Reviewed by [name], RPH-CPP (Pharmacist) on 12/30/20 at [place] List Status: <None>   Medication Order Taking? Sig Documenting Provider Last Dose Status Informant  ACCU-CHEK SMARTVIEW test strip [id number]  USE 1 STRIP TO CHECK GLUCOSE TWICE DAILY Karamalegos, [name], DO  Active   acetaminophen (TYLENOL) 500 MG tablet [id number]  Take 1,000 mg by mouth every 8 (eight) hours as needed.  [provider]  Active Spouse/Significant Other  allopurinol (ZYLOPRIM) 100 MG tablet [id number]   TAKE 1 TABLET AT BEDTIME [name], DO  Active   aspirin EC 81 MG EC tablet [id number]  Take 1 tablet (81 mg total) by mouth daily. Swallow whole. [name], MD  Active   atorvastatin (LIPITOR) 10 MG tablet [id number] Yes Take 1 tablet (10 mg total) by mouth daily. [name], DO Taking Active   BRILINTA 90 MG TABS tablet [id number]  TAKE 1 TABLET (90 MG TOTAL) BY MOUTH 2 (TWO) TIMES DAILY. Karamalegos, [name], DO  Active   calcium carbonate (TUMS - DOSED IN MG ELEMENTAL CALCIUM) 500 MG chewable tablet [id number]  Chew 2 tablets by mouth at bedtime as needed for indigestion or heartburn. [provider]  Active   carvedilol (COREG) 12.5 MG tablet [id number] Yes TAKE 1 TABLET TWICE DAILY Karamalegos, [name], DO Taking Active   DROPLET PEN NEEDLES 32G X 4 MM MISC [id number]  USE WITH [name] INSULIN INJECTION DAILY AS INSTRUCTED [name], DO  Active   ezetimibe (ZETIA) 10 MG tablet [id number] Yes TAKE 1 TABLET (10 MG TOTAL) BY MOUTH DAILY. [name], DO Taking Active   fluticasone (FLONASE) 50 MCG/ACT nasal spray [id number]  USE 2 SPRAYS IN EACH NOSTRIL ONCE DAILY USE FOR 4 TO 6 WEEKS, THEN STOP AND USE SEASONALLY OR AS NEEDED. Karamalegos, [name], DO  Active   gabapentin (NEURONTIN) 100 MG capsule [id number]  Start 1 capsule daily, increase by 1 cap every 2-3 days as tolerated up to 3 times a day, or may take 3 at once in evening. [name], DO  Active   Insulin Glargine ([name] KWIKPEN) 100 UNIT/ML [id number] Yes Inject 32 Units into the skin at bedtime. [name], DO Taking Active   lisinopril (ZESTRIL) 40 MG tablet [id number] Yes TAKE 1 TABLET EVERY DAY Karamalegos, [name], DO Taking Active   nitroGLYCERIN (NITROSTAT) 0.4 MG SL tablet [id number]  Place 1 tablet (0.4 mg total) under the tongue every 5 (five) minutes as needed for chest pain. [name], MD  Active   omeprazole  (PRILOSEC) 20 MG capsule [id number]  TAKE 1 CAPSULE EVERY DAY BEFORE BREAKFAST Karamalegos, [name], DO  Active   Semaglutide (RYBELSUS) 7 MG TABS [id number] Yes Take 1 tablet (7 mg) by mouth daily on an empty stomach, at least 30 minutes before the first food, beverage, or other oral medications of the day with 4 oz of plain water only [provider]  Active             Patient Active Problem List   Diagnosis Date Noted   Hx of gout    Elevated PSA, less than 10 ng/ml 10/03/2017   Lumbar radiculopathy 10/03/2017   Drug-induced myopathy 10/03/2017   Obesity (BMI 30.0-34.9) 10/03/2017   History of pulmonary embolus (PE) 12/05/2016   Chronic pain of left  knee 10/07/2016   CKD stage 3 due to type 2 diabetes mellitus ([place]) 10/07/2016   Muscle strain 11/17/2015   Low back pain 10/08/2015   Hx of hematuria 07/16/2015   Ganglion cyst of left foot 01/23/2015   Smokeless tobacco use 01/12/2015   Type 2 diabetes mellitus with other specified complication ([place]) 12/75/1700   Benign hypertension with CKD (chronic kidney disease) stage III ([place]) 10/16/2014   Hyperlipidemia associated with type 2 diabetes mellitus ([place]) 10/16/2014   Gout 10/16/2014   GERD without esophagitis 10/16/2014   Injury of tendon of upper extremity 05/29/2014   Glenoid labral tear 05/29/2014   Complete rotator cuff rupture of left shoulder 05/09/2014   Infraspinatus tenosynovitis 05/09/2014   Other synovitis and tenosynovitis, right shoulder 05/09/2014    Immunization History  Administered Date(s) Administered   PFIZER(Purple Top)SARS-COV-2 Vaccination 05/15/2019, 06/05/2019   Tdap 07/25/2016    Conditions to be addressed/monitored: HTN, HLD, and DMII  Care Plan : PharmD - Medication Management  Updates made by [name], RPH-CPP since 12/30/2020 12:00 AM     Problem: Disease Progression      Long-Range Goal: Disease Progression Prevented or Minimized   Start Date: 04/22/2020   Expected End Date: 07/21/2020  This Visit's Progress: On track  Recent Progress: On track  Priority: High  Note:   Current Barriers:  Financial Barriers - patient has [employer] and reports copay for Bydureon & insulin glargine is cost prohibitive at this time Patient APPROVED for patient assistance for [name] from [employer] through through 03/27/2021 Patient APPROVED for patient assistance from [name] through 03/27/2021 Patient APPROVED to receive Rybelsus from [employer] patient assistance program through 03/27/21 Lack of blood sugar or blood pressure readings for clinical team  Pharmacist Clinical Goal(s):  Over the next 90 days, patient will achieve adherence to monitoring guidelines and medication adherence to achieve therapeutic efficacy through collaboration with PharmD and provider.   Interventions: 1:1 collaboration with [name], DO regarding development and update of comprehensive plan of care as evidenced by provider attestation and co-signature Inter-disciplinary care team collaboration (see longitudinal plan of care)  Medication Assistance: Confirms received Rybelsus from [employer] patient assistance program.  Confirms received refill of Engineer, agricultural from [place] Patient Assistance Program  Type 2 Diabetes: Uncontrolled; current treatment: Bydureon 2 mg weekly on Tuesdays (last administered on 10/4) [name] 32 units QHS  Previous therapies tried: Trulicity (GI intolerance) Metformin (unable to tolerate - GI side effects) Recent home blood sugar readings:   Morning Blood Sugar  26 - September 135  27 - September 186  28 - September 186  29 - September 169  30 - September -  1 - October -  2 - October -  3 - October -  4 - October 185  5 - October 201    Encourage patient to work on positive dietary changes to have well-balanced meals and reduce carbohydrate portion sizes Discuss limiting snacking in evenings and healthier snack  options, as well as having meals more regularly throughout the day Will mail patient handout on healthy meal planning and snacking Reports had not yet started Rybelsus as wanted to wait until came back to town before starting new medication Reports administered last dose of Bydureon yesterday and planning to start Rybelsus 7 mg daily on 10/10 or 10/11 Review counseling with patient and spouse on Rybelsus, including administration, side effects, and benefits. No personal or family history of medullary thyroid cancer, personal history of pancreatitis or  gallbladder disease. Counseled on potential side effects of nausea, stomach upset, queasiness, constipation, and that these generally improve over time. Advised to contact our office with more severe symptoms, including nausea, diarrhea, stomach pain. Patient verbalized understanding. Verbalized understanding via teach back of administration direction: to take Rybelsus ?30 minutes before the first food, beverage, or other oral medications of the day with ?4 oz of plain water only and then to eat 30-60 minutes after the dose Counsel patient to check fasting CBG readings daily and follow directions for [name] dose adjustment if needed (1 unit weekly if fasting readings consistently >150) Have discussed benefits of continuous blood glucose monitor and offer to investigate health plan coverage for CBGM Patient declines for now, preferring to wait until after next A1C to discuss again Have encouraged patient to use glucometer to check blood sugar 2 hours after meals (breakfast, lunch or supper) a couple of times/week to use as feedback on impact of dietary choices on blood sugar    Hypertension: Controlled; current treatment: Carvedilol 12.5 mg twice daily Lisinopril 40 mg daily  Denies checking home BP recently Counsel on BP monitoring technique Encourage patient to monitor home blood pressure, keep log of results, have to review at upcoming  appointments, but to contact office sooner for readings outside of established parameters   Hyperlipidemia: Controlled; current treatment: Ezetimibe 10 mg daily Atorvastatin 10 mg daily Previous therapies tried: atorvastatin 40 mg daily (unable to tolerate due to myalgia) Have counseled on importance of cholesterol control and dietary considerations to help improve cholesterol and counsel to review nutrition labels  Patient Goals/Self-Care Activities Over the next 90 days, patient will:  - take medications as directed  Patient uses weekly pillbox to manage medications - check glucose, document, and provide at future appointments - check blood pressure, document, and provide at future appointments - attend medical appointments as scheduled  Follow Up Plan: Telephone follow up appointment with care management team member scheduled for: 01/13/2021 at 8:30 AM      Patient's preferred pharmacy is:  [facility] [address], [place] - [place] [address] Phone: [phone] Fax: [phone]  [facility] [place], [place] [address] Phone: [phone] Fax: [phone]   Follow Up:  Patient agrees to Care Plan and Follow-up.  Wallace Cullens, PharmD, Para [place], CPP Clinical Pharmacist [facility] [phone]

## 2020-12-30 NOTE — Patient Instructions (Signed)
Visit Information  PATIENT GOALS:  Goals Addressed             This Visit's Progress    Pharmacy - Patient Goals       Rybelsus may cause stomach upset, queasiness, or constipation, especially when first starting. This generally improves over time. Call our office if these symptoms occur and worsen, or if you have severe symptoms such as vomiting, diarrhea, or stomach pain.   Our goal A1c is less than 7%. This corresponds with fasting sugars less than 130 and 2 hour after meal sugars less than 180. Please check your fasting blood sugar each morning and keep a record of the results  Please check your blood pressure and keep record of the results  Our goal bad cholesterol, or LDL, is less than 70 . This is why it is important to continue taking your atorvastatin and ezetimibe  Feel free to call me with any questions or concerns. I look forward to our next call   Estelle Grumbles, PharmD, BCACP Clinical Pharmacist [facility] [phone]          The patient verbalized understanding of instructions, educational materials, and care plan provided today and declined offer to receive copy of patient instructions, educational materials, and care plan.   Telephone follow up appointment with care management team member scheduled for: 01/13/2021 at 8:30 AM

## 2021-01-13 ENCOUNTER — Other Ambulatory Visit: Payer: Self-pay | Admitting: Family Medicine

## 2021-01-13 ENCOUNTER — Ambulatory Visit: Payer: Medicare HMO | Admitting: Pharmacist

## 2021-01-13 DIAGNOSIS — E1169 Type 2 diabetes mellitus with other specified complication: Secondary | ICD-10-CM

## 2021-01-13 DIAGNOSIS — E785 Hyperlipidemia, unspecified: Secondary | ICD-10-CM

## 2021-01-13 DIAGNOSIS — I129 Hypertensive chronic kidney disease with stage 1 through stage 4 chronic kidney disease, or unspecified chronic kidney disease: Secondary | ICD-10-CM

## 2021-01-13 DIAGNOSIS — Z794 Long term (current) use of insulin: Secondary | ICD-10-CM

## 2021-01-13 NOTE — Chronic Care Management (AMB) (Signed)
Chronic Care Management Pharmacy Note  01/13/2021 Name:  [name] MRN:  [id number] DOB:  Nov 13, 1950   Subjective: [name] is an 70 y.o. year old male who is a primary patient of [name], DO.  The CCM team was consulted for assistance with disease management and care coordination needs.    Engaged with patient by telephone for follow up visit in response to provider referral for pharmacy case management and/or care coordination services.   Consent to Services:  The patient was given information about Chronic Care Management services, agreed to services, and gave verbal consent prior to initiation of services.  Please see initial visit note for detailed documentation.   Patient Care Team: [name], DO as PCP - General (Family Medicine) [name], MD as PCP - Cardiology (Cardiology) Curley Spice Virl Diamond, RPH-CPP as Pharmacist  [facility] visits: None in previous 6 months  Objective:  Lab Results  Component Value Date   CREATININE 1.57 (H) 11/03/2020   CREATININE 1.59 (H) 12/31/2019   CREATININE 1.65 (H) 12/30/2019    Lab Results  Component Value Date   HGBA1C 11.8 (H) 11/03/2020   Last diabetic Eye exam:  Lab Results  Component Value Date/Time   HMDIABEYEEXA No Retinopathy 03/17/2020 12:00 AM    Last diabetic Foot exam: No results found for: HMDIABFOOTEX      Component Value Date/Time   CHOL 90 11/03/2020 0813   CHOL 212 (H) 11/12/2019 0829   TRIG 141 11/03/2020 0813   HDL 32 (L) 11/03/2020 0813   HDL 32 (L) 11/12/2019 0829   CHOLHDL 2.8 11/03/2020 0813   VLDL 37 12/29/2019 0026   LDLCALC 36 11/03/2020 0813    Hepatic Function Latest Ref Rng & Units 11/03/2020 12/27/2019 11/12/2019  Total Protein 6.1 - 8.1 g/dL 7.0 8.5(H) 7.1  Albumin 3.5 - 5.0 g/dL - 4.3 4.1  AST 10 - 35 U/L '15 25 18  ' ALT 9 - 46 U/L '17 19 16  ' Alk Phosphatase 38 - 126 U/L - 58 77  Total Bilirubin 0.2 - 1.2 mg/dL 0.4 0.6 0.3   Bilirubin, Direct 0.0 - 0.2 mg/dL - <0.1 -    Lab Results  Component Value Date/Time   TSH 3.16 11/03/2020 08:13 AM    CBC Latest Ref Rng & Units 11/03/2020 12/31/2019 12/30/2019  WBC 3.8 - 10.8 Thousand/uL 6.2 6.2 5.2  Hemoglobin 13.2 - 17.1 g/dL 13.1(L) 10.8(L) 10.1(L)  Hematocrit 38.5 - 50.0 % 41.6 31.5(L) 30.8(L)  Platelets 140 - 400 Thousand/uL 246 252 277    No results found for: VD25OH  Clinical ASCVD: Yes  The ASCVD Risk score (Arnett DK, et al., 2019) failed to calculate for the following reasons:   The patient has a prior MI or stroke diagnosis     Social History   Tobacco Use  Smoking Status Former   Types: Cigars   Quit date: 2000   Years since quitting: 22.8  Smokeless Tobacco Current   Types: Chew  Tobacco Comments   wife states patient never smoked cigarettes, just chewing tobacco   BP Readings from Last 3 Encounters:  11/10/20 107/61  09/18/20 (!) 102/52  07/10/20 (!) 118/52   Pulse Readings from Last 3 Encounters:  11/10/20 78  09/18/20 72  07/10/20 73   Wt Readings from Last 3 Encounters:  11/10/20 230 lb (104.3 kg)  09/18/20 228 lb (103.4 kg)  07/10/20 226 lb 12.8 oz (102.9 kg)    Assessment: Review of patient past medical  history, allergies, medications, health status, including review of consultants reports, laboratory and other test data, was performed as part of comprehensive evaluation and provision of chronic care management services.   SDOH:  (Social Determinants of Health) assessments and interventions performed:    CCM Care Plan  Allergies  Allergen Reactions   Crestor [Rosuvastatin Calcium] Other (See Comments)    myalgia   Invokana [Canagliflozin] Other (See Comments)    dehydrated   Prednisone Other (See Comments)   Sulfamethizole     Medications Reviewed Today     Reviewed by [name], RPH-CPP (Pharmacist) on 12/30/20 at [place] List Status: <None>   Medication Order Taking? Sig Documenting Provider Last  Dose Status Informant  ACCU-CHEK SMARTVIEW test strip [id number]  USE 1 STRIP TO CHECK GLUCOSE TWICE DAILY Karamalegos, [name], DO  Active   acetaminophen (TYLENOL) 500 MG tablet [id number]  Take 1,000 mg by mouth every 8 (eight) hours as needed.  [provider]  Active Spouse/Significant Other  allopurinol (ZYLOPRIM) 100 MG tablet [id number]  TAKE 1 TABLET AT BEDTIME [name], DO  Active   aspirin EC 81 MG EC tablet [id number]  Take 1 tablet (81 mg total) by mouth daily. Swallow whole. [name], MD  Active   atorvastatin (LIPITOR) 10 MG tablet [id number] Yes Take 1 tablet (10 mg total) by mouth daily. [name], DO Taking Active   BRILINTA 90 MG TABS tablet [id number]  TAKE 1 TABLET (90 MG TOTAL) BY MOUTH 2 (TWO) TIMES DAILY. Karamalegos, [name], DO  Active   calcium carbonate (TUMS - DOSED IN MG ELEMENTAL CALCIUM) 500 MG chewable tablet [id number]  Chew 2 tablets by mouth at bedtime as needed for indigestion or heartburn. [provider]  Active   carvedilol (COREG) 12.5 MG tablet [id number] Yes TAKE 1 TABLET TWICE DAILY Karamalegos, [name], DO Taking Active   DROPLET PEN NEEDLES 32G X 4 MM MISC [id number]  USE WITH [name] INSULIN INJECTION DAILY AS INSTRUCTED [name], DO  Active   ezetimibe (ZETIA) 10 MG tablet [id number] Yes TAKE 1 TABLET (10 MG TOTAL) BY MOUTH DAILY. [name], DO Taking Active   fluticasone (FLONASE) 50 MCG/ACT nasal spray [id number]  USE 2 SPRAYS IN EACH NOSTRIL ONCE DAILY USE FOR 4 TO 6 WEEKS, THEN STOP AND USE SEASONALLY OR AS NEEDED. Karamalegos, [name], DO  Active   gabapentin (NEURONTIN) 100 MG capsule [id number]  Start 1 capsule daily, increase by 1 cap every 2-3 days as tolerated up to 3 times a day, or may take 3 at once in evening. [name], DO  Active   Insulin Glargine ([name] KWIKPEN) 100 UNIT/ML [id number] Yes Inject 32 Units into the skin at bedtime.  [name], DO Taking Active   lisinopril (ZESTRIL) 40 MG tablet [id number] Yes TAKE 1 TABLET EVERY DAY Karamalegos, [name], DO Taking Active   nitroGLYCERIN (NITROSTAT) 0.4 MG SL tablet [id number]  Place 1 tablet (0.4 mg total) under the tongue every 5 (five) minutes as needed for chest pain. [name], MD  Active   omeprazole (PRILOSEC) 20 MG capsule [id number]  TAKE 1 CAPSULE EVERY DAY BEFORE BREAKFAST Karamalegos, [name], DO  Active   Semaglutide (RYBELSUS) 7 MG TABS [id number] Yes Take 1 tablet (7 mg) by mouth daily on an empty stomach, at least 30 minutes before the first food, beverage, or other oral medications of the day with 4 oz of plain water only [provider]  Active             Patient Active Problem List   Diagnosis Date Noted   Hx of gout    Elevated PSA, less than 10 ng/ml 10/03/2017   Lumbar radiculopathy 10/03/2017   Drug-induced myopathy 10/03/2017   Obesity (BMI 30.0-34.9) 10/03/2017   History of pulmonary embolus (PE) 12/05/2016   Chronic pain of left knee 10/07/2016   CKD stage 3 due to type 2 diabetes mellitus ([place]) 10/07/2016   Muscle strain 11/17/2015   Low back pain 10/08/2015   Hx of hematuria 07/16/2015   Ganglion cyst of left foot 01/23/2015   Smokeless tobacco use 01/12/2015   Type 2 diabetes mellitus with other specified complication ([place]) 03/20/4974   Benign hypertension with CKD (chronic kidney disease) stage III ([place]) 10/16/2014   Hyperlipidemia associated with type 2 diabetes mellitus ([place]) 10/16/2014   Gout 10/16/2014   GERD without esophagitis 10/16/2014   Injury of tendon of upper extremity 05/29/2014   Glenoid labral tear 05/29/2014   Complete rotator cuff rupture of left shoulder 05/09/2014   Infraspinatus tenosynovitis 05/09/2014   Other synovitis and tenosynovitis, right shoulder 05/09/2014    Immunization History  Administered Date(s) Administered   PFIZER(Purple Top)SARS-COV-2 Vaccination  05/15/2019, 06/05/2019   Tdap 07/25/2016    Conditions to be addressed/monitored: HTN, HLD, and DMII  Care Plan : PharmD - Medication Management  Updates made by [name], RPH-CPP since 01/13/2021 12:00 AM     Problem: Disease Progression      Long-Range Goal: Disease Progression Prevented or Minimized   Start Date: 04/22/2020  Expected End Date: 07/21/2020  This Visit's Progress: On track  Recent Progress: On track  Priority: High  Note:   Current Barriers:  Financial Barriers - patient has [employer] and reports copay for Bydureon & insulin glargine is cost prohibitive at this time Patient APPROVED for patient assistance for [name] from [place] through through 03/27/2021 Patient APPROVED for patient assistance from [name] through 03/27/2021 Patient APPROVED to receive Rybelsus from [employer] patient assistance program through 03/27/21 Lack of blood sugar or blood pressure readings for clinical team  Pharmacist Clinical Goal(s):  Over the next 90 days, patient will achieve adherence to monitoring guidelines and medication adherence to achieve therapeutic efficacy through collaboration with PharmD and provider.   Interventions: 1:1 collaboration with [name], DO regarding development and update of comprehensive plan of care as evidenced by provider attestation and co-signature Inter-disciplinary care team collaboration (see longitudinal plan of care) Reports occasionally has weakness in his legs. Encourage patient to discuss this with PCP  Medication Assistance: Will plan to initiate [name] and  [place] with patient during next telephone visit  Type 2 Diabetes: Uncontrolled; current treatment: Rybelsus 7 mg daily ?30 minutes before the first food, beverage, or other oral medications of the day with ?4 oz of plain water (started on 10/10) Reports had some nausea with first couple of days, but now tolerating  well [name] 33 units QHS (increased by 1 unit on 10/17) Previous therapies tried: Trulicity (GI intolerance) Metformin (unable to tolerate - GI side effects) Bydureon (difficulty with device/administration) Recent home blood sugar readings:  Morning Blood Sugar Notes  13 - October 204   14 - October 165   15 - October    16 - October 266* *"I ate too much the night before"  17 - October 184 Increased [name] to 33 units  18 - October 186  19 - October 231* *Large portion of cheese puffs night before   Reports working on positive dietary changes to have well-balanced meals and reduce carbohydrate portion sizes Reports he and wife reviewed handout on healthy meal planning Increasing portions of non-starchy vegetables Working on cutting out sweets, particularly by not bringing these into the house Confirms taking Rybelsus ?30 minutes before the first food, beverage, or other oral medications of the day with ?4 oz of plain water only and then eating 30-60 minutes after the dose Counsel patient to continue to check fasting CBG readings daily and follow directions for [name] dose adjustment if needed (1 unit weekly if fasting readings consistently >150) Discussed benefits of continuous blood glucose monitor (CGM) Patient declines interest in CGM for now, preferring to wait until after next A1C to discuss again Encouraged patient to use glucometer to check blood sugar 2 hours after meals (breakfast, lunch or supper) a couple of times/week to use as feedback on impact of dietary choices on blood sugar    Hypertension: Controlled; current treatment: Carvedilol 12.5 mg twice daily Lisinopril 40 mg daily  BP today: 132/72, HR 67 Encourage patient to monitor home blood pressure, keep log of results, have to review at upcoming appointments, but to contact office sooner for readings outside of established parameters   Hyperlipidemia: Controlled; current treatment: Ezetimibe 10 mg  daily Atorvastatin 10 mg daily Previous therapies tried: atorvastatin 40 mg daily (unable to tolerate due to myalgia) Have counseled on importance of cholesterol control and dietary considerations to help improve cholesterol and counsel to review nutrition labels  Patient Goals/Self-Care Activities Over the next 90 days, patient will:  - take medications as directed  Patient uses weekly pillbox to manage medications - check glucose, document, and provide at future appointments - check blood pressure, document, and provide at future appointments - attend medical appointments as scheduled  Follow Up Plan: Telephone follow up appointment with care management team member scheduled for: 11/9 at 8:30 am      Patient's preferred pharmacy is:  [facility] [address], [place] - [place] [address] Phone: [phone] Fax: [phone]  [facility] - [place], [place] [address] Phone: [phone] Fax: [phone]   Follow Up:  Patient agrees to Care Plan and Follow-up.  Wallace Cullens, PharmD, Para [place], CPP Clinical Pharmacist [facility] [phone]

## 2021-01-13 NOTE — Patient Instructions (Signed)
Visit Information  PATIENT GOALS:  Goals Addressed             This Visit's Progress    Pharmacy - Patient Goals       Our goal A1c is less than 7%. This corresponds with fasting sugars less than 130 and 2 hour after meal sugars less than 180. Please check your fasting blood sugar each morning and keep a record of the results  Please check your blood pressure and keep record of the results  Our goal bad cholesterol, or LDL, is less than 70 . This is why it is important to continue taking your atorvastatin and ezetimibe  Feel free to call me with any questions or concerns. I look forward to our next call   Estelle Grumbles, PharmD, BCACP Clinical Pharmacist [facility] [phone]          The patient verbalized understanding of instructions, educational materials, and care plan provided today and declined offer to receive copy of patient instructions, educational materials, and care plan.   Telephone follow up appointment with care management team member scheduled for: 11/9 at 8:30 am

## 2021-01-13 NOTE — Telephone Encounter (Signed)
Requested Prescriptions  Pending Prescriptions Disp Refills  . ACCU-CHEK SMARTVIEW test strip [Pharmacy Med Name: ACCU-CHEK SMARTVIEW STRIPS   Strip] 200 strip 0    Sig: TEST BLOOD SUGAR TWICE DAILY     Endocrinology: Diabetes - Testing Supplies Passed - 01/13/2021  4:21 AM      Passed - Valid encounter within last 12 months    Recent Outpatient Visits          2 months ago Annual physical exam   [facility] [place], Netta Neat, DO   6 months ago Type 2 diabetes mellitus with other specified complication, with long-term current use of insulin [facility])   [facility], Netta Neat, DO   10 months ago Type 2 diabetes mellitus with other specified complication, with long-term current use of insulin [facility])   [facility] [name], DO   1 year ago History of non-ST elevation myocardial infarction (NSTEMI)   [facility] [name], DO   1 year ago Annual physical exam   [facility] [name], DO      Future Appointments            In 1 month Althea Charon, Netta Neat, DO [facility]   In 2 months Debbe Odea, MD [facility]

## 2021-01-20 ENCOUNTER — Ambulatory Visit: Payer: Medicare HMO

## 2021-01-20 DIAGNOSIS — M7522 Bicipital tendinitis, left shoulder: Secondary | ICD-10-CM | POA: Diagnosis not present

## 2021-01-20 DIAGNOSIS — M7582 Other shoulder lesions, left shoulder: Secondary | ICD-10-CM | POA: Diagnosis not present

## 2021-01-20 DIAGNOSIS — M25512 Pain in left shoulder: Secondary | ICD-10-CM | POA: Diagnosis not present

## 2021-01-21 ENCOUNTER — Ambulatory Visit: Payer: Medicare HMO

## 2021-01-25 ENCOUNTER — Telehealth: Payer: Self-pay | Admitting: Family Medicine

## 2021-01-25 DIAGNOSIS — E1169 Type 2 diabetes mellitus with other specified complication: Secondary | ICD-10-CM

## 2021-01-25 DIAGNOSIS — E785 Hyperlipidemia, unspecified: Secondary | ICD-10-CM | POA: Diagnosis not present

## 2021-01-25 DIAGNOSIS — Z794 Long term (current) use of insulin: Secondary | ICD-10-CM

## 2021-01-25 DIAGNOSIS — N183 Chronic kidney disease, stage 3 unspecified: Secondary | ICD-10-CM | POA: Diagnosis not present

## 2021-01-25 DIAGNOSIS — I129 Hypertensive chronic kidney disease with stage 1 through stage 4 chronic kidney disease, or unspecified chronic kidney disease: Secondary | ICD-10-CM | POA: Diagnosis not present

## 2021-01-25 NOTE — Telephone Encounter (Signed)
While on the phone with patient scheduling his AWV, patient asked what he could take OTC for cold.  He states that he is on blood thinners and other medications and wasn't sure. I advised patient I would send a message to the nurse and have someone call him. Please call patient and advise what he can take OTC.

## 2021-01-25 NOTE — Telephone Encounter (Signed)
Tylenol as needed for aches, headache, fever  Mucinex (plain with no additives, no DM, no sinus med) can help with cough and chest congestion  Nasal Saline or Nasal Steroid (Flonase) can get OTC nasal steroid or other types.  Saralyn Pilar, DO [facility] 01/25/2021, 2:41 PM

## 2021-01-26 ENCOUNTER — Ambulatory Visit (INDEPENDENT_AMBULATORY_CARE_PROVIDER_SITE_OTHER): Payer: Medicare HMO

## 2021-01-26 VITALS — Ht 71.0 in | Wt 231.0 lb

## 2021-01-26 DIAGNOSIS — Z0000 Encounter for general adult medical examination without abnormal findings: Secondary | ICD-10-CM | POA: Diagnosis not present

## 2021-01-26 NOTE — Progress Notes (Signed)
I connected with [name] today by telephone and verified that I am speaking with the correct person using two identifiers. Location patient: home Location provider: work Persons participating in the virtual visit: patient, provider.   I discussed the limitations, risks, security and privacy concerns of performing an evaluation and management service by telephone and the availability of in person appointments. I also discussed with the patient that there may be a patient responsible charge related to this service. The patient expressed understanding and verbally consented to this telephonic visit.    Interactive audio and video telecommunications were attempted between this provider and patient, however failed, due to patient having technical difficulties OR patient did not have access to video capability.  We continued and completed visit with audio only.     Vital signs may be patient reported or missing.  Subjective:   [name] is a 70 y.o. male who presents for Medicare Annual/Subsequent preventive examination.  Review of Systems     Cardiac Risk Factors include: advanced age (>17men, >62 women);diabetes mellitus;hypertension;dyslipidemia;male gender;obesity (BMI >30kg/m2);smoking/ tobacco exposure     Objective:    Today's Vitals   01/26/21 0818  Weight: 231 lb (104.8 kg)  Height: 5\' 11"  (1.803 m)   Body mass index is 32.22 kg/m.  Advanced Directives 01/26/2021 01/22/2020 01/21/2020 12/28/2019 12/27/2019 02/05/2019 11/06/2018  Does Patient Have a Medical Advance Directive? No No No No No No No  Would patient like information on creating a medical advance directive? - No - Patient declined - No - Patient declined No - Patient declined - -    Current Medications (verified) Outpatient Encounter Medications as of 01/26/2021  Medication Sig   ACCU-CHEK SMARTVIEW test strip TEST BLOOD SUGAR TWICE DAILY   acetaminophen (TYLENOL) 500 MG tablet Take 1,000 mg by mouth  every 8 (eight) hours as needed.    allopurinol (ZYLOPRIM) 100 MG tablet TAKE 1 TABLET AT BEDTIME   aspirin EC 81 MG EC tablet Take 1 tablet (81 mg total) by mouth daily. Swallow whole.   atorvastatin (LIPITOR) 10 MG tablet Take 1 tablet (10 mg total) by mouth daily.   BRILINTA 90 MG TABS tablet TAKE 1 TABLET (90 MG TOTAL) BY MOUTH 2 (TWO) TIMES DAILY.   carvedilol (COREG) 12.5 MG tablet TAKE 1 TABLET TWICE DAILY   DROPLET PEN NEEDLES 32G X 4 MM MISC USE WITH [name] INSULIN INJECTION DAILY AS INSTRUCTED   ezetimibe (ZETIA) 10 MG tablet TAKE 1 TABLET (10 MG TOTAL) BY MOUTH DAILY.   fluticasone (FLONASE) 50 MCG/ACT nasal spray USE 2 SPRAYS IN EACH NOSTRIL ONCE DAILY USE FOR 4 TO 6 WEEKS, THEN STOP AND USE SEASONALLY OR AS NEEDED.   Insulin Glargine ([name] KWIKPEN) 100 UNIT/ML Inject 32 Units into the skin at bedtime. (Patient taking differently: Inject 33 Units into the skin at bedtime.)   lisinopril (ZESTRIL) 40 MG tablet TAKE 1 TABLET EVERY DAY   nitroGLYCERIN (NITROSTAT) 0.4 MG SL tablet Place 1 tablet (0.4 mg total) under the tongue every 5 (five) minutes as needed for chest pain.   omeprazole (PRILOSEC) 20 MG capsule TAKE 1 CAPSULE EVERY DAY BEFORE BREAKFAST   Semaglutide (RYBELSUS) 7 MG TABS Take 1 tablet (7 mg) by mouth daily on an empty stomach, at least 30 minutes before the first food, beverage, or other oral medications of the day with 4 oz of plain water only   calcium carbonate (TUMS - DOSED IN MG ELEMENTAL CALCIUM) 500 MG chewable tablet Chew 2 tablets by  mouth at bedtime as needed for indigestion or heartburn. (Patient not taking: Reported on 01/26/2021)   gabapentin (NEURONTIN) 100 MG capsule Start 1 capsule daily, increase by 1 cap every 2-3 days as tolerated up to 3 times a day, or may take 3 at once in evening. (Patient not taking: Reported on 01/26/2021)   No facility-administered encounter medications on file as of 01/26/2021.    Allergies (verified) Crestor [rosuvastatin  calcium], Invokana [canagliflozin], Prednisone, and Sulfamethizole   History: Past Medical History:  Diagnosis Date   Allergy    Chronic headaches    GERD (gastroesophageal reflux disease)    Heart burn    Hyperlipidemia    Hypertension    Past Surgical History:  Procedure Laterality Date   BACK SURGERY  02/26/2019   KNEE SURGERY     LEFT HEART CATH AND CORONARY ANGIOGRAPHY N/A 12/30/2019   Procedure: LEFT HEART CATH AND CORONARY ANGIOGRAPHY;  Surgeon: Wellington Hampshire, MD;  Location: [facility];  Service: Cardiovascular;  Laterality: N/A;   SHOULDER SURGERY     TONSILLECTOMY     Family History  Problem Relation Age of Onset   Cancer Father        lung   Heart disease Sister    Heart disease Brother    Prostate cancer Neg Hx    Social History   Socioeconomic History   Marital status: Married    Spouse name: Not on file   Number of children: Not on file   Years of education: Not on file   Highest education level: Not on file  Occupational History   Occupation: retired  Tobacco Use   Smoking status: Former    Types: Cigars    Quit date: 2000    Years since quitting: 22.8   Smokeless tobacco: Current    Types: Chew   Tobacco comments:    wife states patient never smoked cigarettes, just chewing tobacco  Vaping Use   Vaping Use: Never used  Substance and Sexual Activity   Alcohol use: Not Currently    Alcohol/week: 0.0 standard drinks   Drug use: No   Sexual activity: Yes    Birth control/protection: Inserts  Other Topics Concern   Not on file  Social History Narrative   Not on file   Social Determinants of Health   Financial Resource Strain: Low Risk    Difficulty of Paying Living Expenses: Not hard at all  Food Insecurity: No Food Insecurity   Worried About Charity fundraiser in the Last Year: Never true   [place] in the Last Year: Never true  Transportation Needs: No Transportation Needs   Lack of Transportation (Medical): No    Lack of Transportation (Non-Medical): No  Physical Activity: Inactive   Days of Exercise per Week: 0 days   Minutes of Exercise per Session: 0 min  Stress: No Stress Concern Present   Feeling of Stress : Not at all  Social Connections: Not on file    Tobacco Counseling Ready to quit: No Counseling given: Not Answered Tobacco comments: wife states patient never smoked cigarettes, just chewing tobacco   Clinical Intake:  Pre-visit preparation completed: Yes  Pain : No/denies pain     Nutritional Status: BMI > 30  Obese Nutritional Risks: None Diabetes: Yes  How often do you need to have someone help you when you read instructions, pamphlets, or other written materials from your doctor or pharmacy?: 1 - Never What is the last  grade level you completed in school?: 8th grade  Diabetic? Yes Nutrition Risk Assessment:  Has the patient had any N/V/D within the last 2 months?  Yes  Does the patient have any non-healing wounds?  No  Has the patient had any unintentional weight loss or weight gain?  No   Diabetes:  Is the patient diabetic?  Yes  If diabetic, was a CBG obtained today?  No  Did the patient bring in their glucometer from home?  No  How often do you monitor your CBG's? daily.   Financial Strains and Diabetes Management:  Are you having any financial strains with the device, your supplies or your medication? No .  Does the patient want to be seen by Chronic Care Management for management of their diabetes?  No  Would the patient like to be referred to a Nutritionist or for Diabetic Management?  No   Diabetic Exams:  Diabetic Eye Exam: Completed 03/17/2020 Diabetic Foot Exam: Completed 11/10/2020   Interpreter Needed?: No  Information entered by :: NAllen LPN   Activities of Daily Living In your present state of health, do you have any difficulty performing the following activities: 01/26/2021  Hearing? Y  Comment wears hearing aides  Vision? Y  Comment  trouble at night  Difficulty concentrating or making decisions? N  Walking or climbing stairs? Y  Dressing or bathing? N  Doing errands, shopping? N  Preparing Food and eating ? N  Using the Toilet? N  In the past six months, have you accidently leaked urine? Y  Do you have problems with loss of bowel control? N  Managing your Medications? N  Managing your Finances? N  Housekeeping or managing your Housekeeping? N  Some recent data might be hidden    Patient Care Team: [name], DO as PCP - General (Family Medicine) Debbe Odea, MD as PCP - Cardiology (Cardiology) Ronney Asters, Jackelyn Poling, RPH-CPP as Pharmacist  Indicate any recent Medical Services you may have received from other than [facility] providers in the past year (date may be approximate).     Assessment:   This is a routine wellness examination for [name].  Hearing/Vision screen Vision Screening - Comments:: Regular eye exams, [facility]  Dietary issues and exercise activities discussed: Current Exercise Habits: The patient does not participate in regular exercise at present   Goals Addressed             This Visit's Progress    Patient Stated       03/28/2020, wants to get sugar down       Depression Screen PHQ 2/9 Scores 01/26/2021 11/10/2020 01/23/2020 01/21/2020 11/18/2019 08/28/2018 04/23/2018  PHQ - 2 Score 0 0 0 0 0 0 0  PHQ- 9 Score - 0 1 - - - -    Fall Risk Fall Risk  01/26/2021 11/10/2020 01/22/2020 01/21/2020 11/18/2019  Falls in the past year? 0 0 0 0 0  Number falls in past yr: - 0 0 - 0  Injury with Fall? - 0 0 - 0  Risk for fall due to : Medication side effect - No Fall Risks Medication side effect -  Follow up Falls evaluation completed;Education provided;Falls prevention discussed Falls evaluation completed Falls evaluation completed;Falls prevention discussed;Education provided Falls evaluation completed;Education provided;Falls prevention discussed Falls evaluation  completed    FALL RISK PREVENTION PERTAINING TO THE HOME:  Any stairs in or around the home? Yes  If so, are there any without handrails? No  Home free  of loose throw rugs in walkways, pet beds, electrical cords, etc? Yes  Adequate lighting in your home to reduce risk of falls? Yes   ASSISTIVE DEVICES UTILIZED TO PREVENT FALLS:  Life alert? No  Use of a cane, walker or w/c? No  Grab bars in the bathroom? No  Shower chair or bench in shower? Yes  Elevated toilet seat or a handicapped toilet? Yes   TIMED UP AND GO:  Was the test performed? No .       Cognitive Function:     6CIT Screen 01/26/2021 01/21/2020 08/29/2017  What Year? 0 points 0 points 0 points  What month? 0 points 0 points 0 points  What time? 0 points 0 points 0 points  Count back from 20 0 points 0 points 0 points  Months in reverse 2 points 4 points 0 points  Repeat phrase 8 points 4 points 2 points  Total Score 10 8 2     Immunizations Immunization History  Administered Date(s) Administered   PFIZER(Purple Top)SARS-COV-2 Vaccination 05/15/2019, 06/05/2019   Tdap 07/25/2016    TDAP status: Up to date  Flu Vaccine status: Declined, Education has been provided regarding the importance of this vaccine but patient still declined. Advised may receive this vaccine at local pharmacy or Health Dept. Aware to provide a copy of the vaccination record if obtained from local pharmacy or Health Dept. Verbalized acceptance and understanding.  Pneumococcal vaccine status: Declined,  Education has been provided regarding the importance of this vaccine but patient still declined. Advised may receive this vaccine at local pharmacy or Health Dept. Aware to provide a copy of the vaccination record if obtained from local pharmacy or Health Dept. Verbalized acceptance and understanding.   Covid-19 vaccine status: Completed vaccines  Qualifies for Shingles Vaccine? Yes   Zostavax completed No   Shingrix Completed?: No.     Education has been provided regarding the importance of this vaccine. Patient has been advised to call insurance company to determine out of pocket expense if they have not yet received this vaccine. Advised may also receive vaccine at local pharmacy or Health Dept. Verbalized acceptance and understanding.  Screening Tests Health Maintenance  Topic Date Due   Zoster Vaccines- Shingrix (1 of 2) Never done   COVID-19 Vaccine (3 - Booster for Pfizer series) 02/11/2021 (Originally 07/31/2019)   INFLUENZA VACCINE  06/25/2021 (Originally 10/26/2020)   Pneumonia Vaccine 4+ Years old (1 - PCV) 01/26/2022 (Originally 08/26/1956)   OPHTHALMOLOGY EXAM  03/17/2021   HEMOGLOBIN A1C  05/06/2021   Fecal DNA (Cologuard)  05/15/2021   FOOT EXAM  11/10/2021   TETANUS/TDAP  07/26/2026   Hepatitis C Screening  Completed   HPV VACCINES  Aged Out    Health Maintenance  Health Maintenance Due  Topic Date Due   Zoster Vaccines- Shingrix (1 of 2) Never done    Colorectal cancer screening: Type of screening: Cologuard. Completed 05/15/2018. Repeat every 3 years  Lung Cancer Screening: (Low Dose CT Chest recommended if Age 68-80 years, 30 pack-year currently smoking OR have quit w/in 15years.) does not qualify.   Lung Cancer Screening Referral: no  Additional Screening:  Hepatitis C Screening: does qualify; Completed 08/29/2017  Vision Screening: Recommended annual ophthalmology exams for early detection of glaucoma and other disorders of the eye. Is the patient up to date with their annual eye exam?  Yes  Who is the provider or what is the name of the office in which the patient attends annual eye exams? [place]  [place] If pt is not established with a provider, would they like to be referred to a provider to establish care? No .   Dental Screening: Recommended annual dental exams for proper oral hygiene  Community Resource Referral / Chronic Care Management: CRR required this visit?  No   CCM required  this visit?  No      Plan:     I have personally reviewed and noted the following in the patient's chart:   Medical and social history Use of alcohol, tobacco or illicit drugs  Current medications and supplements including opioid prescriptions. Patient is not currently taking opioid prescriptions. Functional ability and status Nutritional status Physical activity Advanced directives List of other physicians Hospitalizations, surgeries, and ER visits in previous 12 months Vitals Screenings to include cognitive, depression, and falls Referrals and appointments  In addition, I have reviewed and discussed with patient certain preventive protocols, quality metrics, and best practice recommendations. A written personalized care plan for preventive services as well as general preventive health recommendations were provided to patient.     Kellie Simmering, LPN   [phone]   Nurse Notes: none

## 2021-01-26 NOTE — Patient Instructions (Signed)
[name] , Thank you for taking time to come for your Medicare Wellness Visit. I appreciate your ongoing commitment to your health goals. Please review the following plan we discussed and let me know if I can assist you in the future.   Screening recommendations/referrals: Colonoscopy: cologuard 05/15/2018, due 05/15/2021 Recommended yearly ophthalmology/optometry visit for glaucoma screening and checkup Recommended yearly dental visit for hygiene and checkup  Vaccinations: Influenza vaccine: decline Pneumococcal vaccine: decline Tdap vaccine: completed 07/25/2016, due 07/26/2026 Shingles vaccine: discussed   Covid-19:  06/05/2019, 05/15/2019  Advanced directives: Advance directive discussed with you today. Even though you declined this today please call our office should you change your mind and we can give you the proper paperwork for you to fill out.  Conditions/risks identified: tobacco use  Next appointment: Follow up in one year for your annual wellness visit.   Preventive Care 69 Years and Older, Male Preventive care refers to lifestyle choices and visits with your health care provider that can promote health and wellness. What does preventive care include? A yearly physical exam. This is also called an annual well check. Dental exams once or twice a year. Routine eye exams. Ask your health care provider how often you should have your eyes checked. Personal lifestyle choices, including: Daily care of your teeth and gums. Regular physical activity. Eating a healthy diet. Avoiding tobacco and drug use. Limiting alcohol use. Practicing safe sex. Taking low doses of aspirin every day. Taking vitamin and mineral supplements as recommended by your health care provider. What happens during an annual well check? The services and screenings done by your health care provider during your annual well check will depend on your age, overall health, lifestyle risk factors, and family history of  disease. Counseling  Your health care provider may ask you questions about your: Alcohol use. Tobacco use. Drug use. Emotional well-being. Home and relationship well-being. Sexual activity. Eating habits. History of falls. Memory and ability to understand (cognition). Work and work Astronomer. Screening  You may have the following tests or measurements: Height, weight, and BMI. Blood pressure. Lipid and cholesterol levels. These may be checked every 5 years, or more frequently if you are over 20 years old. Skin check. Lung cancer screening. You may have this screening every year starting at age 69 if you have a 30-pack-year history of smoking and currently smoke or have quit within the past 15 years. Fecal occult blood test (FOBT) of the stool. You may have this test every year starting at age 69. Flexible sigmoidoscopy or colonoscopy. You may have a sigmoidoscopy every 5 years or a colonoscopy every 10 years starting at age 69. Prostate cancer screening. Recommendations will vary depending on your family history and other risks. Hepatitis C blood test. Hepatitis B blood test. Sexually transmitted disease (STD) testing. Diabetes screening. This is done by checking your blood sugar (glucose) after you have not eaten for a while (fasting). You may have this done every 1-3 years. Abdominal aortic aneurysm (AAA) screening. You may need this if you are a current or former smoker. Osteoporosis. You may be screened starting at age 53 if you are at high risk. Talk with your health care provider about your test results, treatment options, and if necessary, the need for more tests. Vaccines  Your health care provider may recommend certain vaccines, such as: Influenza vaccine. This is recommended every year. Tetanus, diphtheria, and acellular pertussis (Tdap, Td) vaccine. You may need a Td booster every 10 years. Zoster vaccine.  You may need this after age 21. Pneumococcal 13-valent  conjugate (PCV13) vaccine. One dose is recommended after age 8. Pneumococcal polysaccharide (PPSV23) vaccine. One dose is recommended after age 45. Talk to your health care provider about which screenings and vaccines you need and how often you need them. This information is not intended to replace advice given to you by your health care provider. Make sure you discuss any questions you have with your health care provider. Document Released: 04/10/2015 Document Revised: 12/02/2015 Document Reviewed: 01/13/2015 Elsevier Interactive Patient Education  2017 [place] Prevention in the Home Falls can cause injuries. They can happen to people of all ages. There are many things you can do to make your home safe and to help prevent falls. What can I do on the outside of my home? Regularly fix the edges of walkways and driveways and fix any cracks. Remove anything that might make you trip as you walk through a door, such as a raised step or threshold. Trim any bushes or trees on the path to your home. Use bright outdoor lighting. Clear any walking paths of anything that might make someone trip, such as rocks or tools. Regularly check to see if handrails are loose or broken. Make sure that both sides of any steps have handrails. Any raised decks and porches should have guardrails on the edges. Have any leaves, snow, or ice cleared regularly. Use sand or salt on walking paths during winter. Clean up any spills in your garage right away. This includes oil or grease spills. What can I do in the bathroom? Use night lights. Install grab bars by the toilet and in the tub and shower. Do not use towel bars as grab bars. Use non-skid mats or decals in the tub or shower. If you need to sit down in the shower, use a plastic, non-slip stool. Keep the floor dry. Clean up any water that spills on the floor as soon as it happens. Remove soap buildup in the tub or shower regularly. Attach bath mats  securely with double-sided non-slip rug tape. Do not have throw rugs and other things on the floor that can make you trip. What can I do in the bedroom? Use night lights. Make sure that you have a light by your bed that is easy to reach. Do not use any sheets or blankets that are too big for your bed. They should not hang down onto the floor. Have a firm chair that has side arms. You can use this for support while you get dressed. Do not have throw rugs and other things on the floor that can make you trip. What can I do in the kitchen? Clean up any spills right away. Avoid walking on wet floors. Keep items that you use a lot in easy-to-reach places. If you need to reach something above you, use a strong step stool that has a grab bar. Keep electrical cords out of the way. Do not use floor polish or wax that makes floors slippery. If you must use wax, use non-skid floor wax. Do not have throw rugs and other things on the floor that can make you trip. What can I do with my stairs? Do not leave any items on the stairs. Make sure that there are handrails on both sides of the stairs and use them. Fix handrails that are broken or loose. Make sure that handrails are as long as the stairways. Check any carpeting to make sure that it is  firmly attached to the stairs. Fix any carpet that is loose or worn. Avoid having throw rugs at the top or bottom of the stairs. If you do have throw rugs, attach them to the floor with carpet tape. Make sure that you have a light switch at the top of the stairs and the bottom of the stairs. If you do not have them, ask someone to add them for you. What else can I do to help prevent falls? Wear shoes that: Do not have high heels. Have rubber bottoms. Are comfortable and fit you well. Are closed at the toe. Do not wear sandals. If you use a stepladder: Make sure that it is fully opened. Do not climb a closed stepladder. Make sure that both sides of the stepladder  are locked into place. Ask someone to hold it for you, if possible. Clearly mark and make sure that you can see: Any grab bars or handrails. First and last steps. Where the edge of each step is. Use tools that help you move around (mobility aids) if they are needed. These include: Canes. Walkers. Scooters. Crutches. Turn on the lights when you go into a dark area. Replace any light bulbs as soon as they burn out. Set up your furniture so you have a clear path. Avoid moving your furniture around. If any of your floors are uneven, fix them. If there are any pets around you, be aware of where they are. Review your medicines with your doctor. Some medicines can make you feel dizzy. This can increase your chance of falling. Ask your doctor what other things that you can do to help prevent falls. This information is not intended to replace advice given to you by your health care provider. Make sure you discuss any questions you have with your health care provider. Document Released: 01/08/2009 Document Revised: 08/20/2015 Document Reviewed: 04/18/2014 Elsevier Interactive Patient Education  2017 [employer].

## 2021-01-27 NOTE — Telephone Encounter (Signed)
Called and left a message letting him know of recommendations

## 2021-02-03 ENCOUNTER — Ambulatory Visit (INDEPENDENT_AMBULATORY_CARE_PROVIDER_SITE_OTHER): Payer: Medicare HMO | Admitting: Pharmacist

## 2021-02-03 DIAGNOSIS — E785 Hyperlipidemia, unspecified: Secondary | ICD-10-CM

## 2021-02-03 DIAGNOSIS — Z794 Long term (current) use of insulin: Secondary | ICD-10-CM

## 2021-02-03 DIAGNOSIS — N183 Chronic kidney disease, stage 3 unspecified: Secondary | ICD-10-CM

## 2021-02-03 DIAGNOSIS — E1169 Type 2 diabetes mellitus with other specified complication: Secondary | ICD-10-CM

## 2021-02-03 NOTE — Chronic Care Management (AMB) (Signed)
Chronic Care Management Pharmacy Note  02/03/2021 Name:  [name] MRN:  [id number] DOB:  January 13, 1951   Subjective: [name] is an 70 y.o. year old male who is a primary patient of [name], DO.  The CCM team was consulted for assistance with disease management and care coordination needs.    Engaged with patient by telephone for follow up visit in response to provider referral for pharmacy case management and/or care coordination services.   Consent to Services:  The patient was given information about Chronic Care Management services, agreed to services, and gave verbal consent prior to initiation of services.  Please see initial visit note for detailed documentation.   Patient Care Team: [name], DO as PCP - General (Family Medicine) [name], MD as PCP - Cardiology (Cardiology) Curley Spice Virl Diamond, RPH-CPP as Pharmacist  Recent consult visits: Office Visit with [facility] on 10/26   Objective:  Lab Results  Component Value Date   CREATININE 1.57 (H) 11/03/2020   CREATININE 1.59 (H) 12/31/2019   CREATININE 1.65 (H) 12/30/2019    Lab Results  Component Value Date   HGBA1C 11.8 (H) 11/03/2020   Last diabetic Eye exam:  Lab Results  Component Value Date/Time   HMDIABEYEEXA No Retinopathy 03/17/2020 12:00 AM    Last diabetic Foot exam: No results found for: HMDIABFOOTEX      Component Value Date/Time   CHOL 90 11/03/2020 0813   CHOL 212 (H) 11/12/2019 0829   TRIG 141 11/03/2020 0813   HDL 32 (L) 11/03/2020 0813   HDL 32 (L) 11/12/2019 0829   CHOLHDL 2.8 11/03/2020 0813   VLDL 37 12/29/2019 0026   LDLCALC 36 11/03/2020 0813    Hepatic Function Latest Ref Rng & Units 11/03/2020 12/27/2019 11/12/2019  Total Protein 6.1 - 8.1 g/dL 7.0 8.5(H) 7.1  Albumin 3.5 - 5.0 g/dL - 4.3 4.1  AST 10 - 35 U/L _0 ALT 9 - 46 U/L _1 Alk Phosphatase 38 - 126 U/L - 58 77  Total  Bilirubin 0.2 - 1.2 mg/dL 0.4 0.6 0.3  Bilirubin, Direct 0.0 - 0.2 mg/dL - <0.1 -    Social History   Tobacco Use  Smoking Status Former   Types: Cigars   Quit date: 2000   Years since quitting: 22.8  Smokeless Tobacco Current   Types: Chew  Tobacco Comments   wife states patient never smoked cigarettes, just chewing tobacco   BP Readings from Last 3 Encounters:  11/10/20 107/61  09/18/20 (!) 102/52  07/10/20 (!) 118/52   Pulse Readings from Last 3 Encounters:  11/10/20 78  09/18/20 72  07/10/20 73   Wt Readings from Last 3 Encounters:  01/26/21 231 lb (104.8 kg)  11/10/20 230 lb (104.3 kg)  09/18/20 228 lb (103.4 kg)    Assessment: Review of patient past medical history, allergies, medications, health status, including review of consultants reports, laboratory and other test data, was performed as part of comprehensive evaluation and provision of chronic care management services.   SDOH:  (Social Determinants of Health) assessments and interventions performed:    CCM Care Plan  Allergies  Allergen Reactions   Crestor [Rosuvastatin Calcium] Other (See Comments)    myalgia   Invokana [Canagliflozin] Other (See Comments)    dehydrated   Prednisone Other (See Comments)   Sulfamethizole     Medications Reviewed Today     Reviewed by [name], RPH-CPP (  Pharmacist) on 02/03/21 at [place]  Med List Status: <None>   Medication Order Taking? Sig Documenting Provider Last Dose Status Informant  ACCU-CHEK SMARTVIEW test strip [id number]  TEST BLOOD SUGAR TWICE DAILY [name], DO  Active   acetaminophen (TYLENOL) 500 MG tablet [id number]  Take 1,000 mg by mouth every 8 (eight) hours as needed.  [provider]  Active Spouse/Significant Other  allopurinol (ZYLOPRIM) 100 MG tablet [id number]  TAKE 1 TABLET AT BEDTIME [name], DO  Active   aspirin EC 81 MG EC tablet [id number]  Take 1 tablet (81 mg total) by mouth daily. Swallow  whole. [name], MD  Active   atorvastatin (LIPITOR) 10 MG tablet [id number] Yes Take 1 tablet (10 mg total) by mouth daily. [name], DO Taking Active   BRILINTA 90 MG TABS tablet [id number]  TAKE 1 TABLET (90 MG TOTAL) BY MOUTH 2 (TWO) TIMES DAILY. Karamalegos, [name], DO  Active   calcium carbonate (TUMS - DOSED IN MG ELEMENTAL CALCIUM) 500 MG chewable tablet [id number]  Chew 2 tablets by mouth at bedtime as needed for indigestion or heartburn.  Patient not taking: Reported on 01/26/2021   [provider]  Active   carvedilol (COREG) 12.5 MG tablet [id number]  TAKE 1 TABLET TWICE DAILY [name], DO  Active   DROPLET PEN NEEDLES 32G X 4 MM MISC [id number]  USE WITH [name] INSULIN INJECTION DAILY AS INSTRUCTED [name], DO  Active   ezetimibe (ZETIA) 10 MG tablet [id number] Yes TAKE 1 TABLET (10 MG TOTAL) BY MOUTH DAILY. [name], DO Taking Active   fluticasone (FLONASE) 50 MCG/ACT nasal spray [id number]  USE 2 SPRAYS IN EACH NOSTRIL ONCE DAILY USE FOR 4 TO 6 WEEKS, THEN STOP AND USE SEASONALLY OR AS NEEDED. Karamalegos, [name], DO  Active   gabapentin (NEURONTIN) 100 MG capsule [id number]  Start 1 capsule daily, increase by 1 cap every 2-3 days as tolerated up to 3 times a day, or may take 3 at once in evening.  Patient not taking: Reported on 01/26/2021   [name], DO  Active   Insulin Glargine [facility]) 100 UNIT/ML [id number] Yes Inject 32 Units into the skin at bedtime.  Patient taking differently: Inject 34 Units into the skin at bedtime.   [name], DO Taking Active   lisinopril (ZESTRIL) 40 MG tablet [id number]  TAKE 1 TABLET EVERY DAY Karamalegos, [name], DO  Active   nitroGLYCERIN (NITROSTAT) 0.4 MG SL tablet [id number]  Place 1 tablet (0.4 mg total) under the tongue every 5 (five) minutes as needed for chest pain. [name], MD  Active   omeprazole  (PRILOSEC) 20 MG capsule [id number]  TAKE 1 CAPSULE EVERY DAY BEFORE BREAKFAST Karamalegos, [name], DO  Active   Semaglutide (RYBELSUS) 7 MG TABS [id number] Yes Take 1 tablet (7 mg) by mouth daily on an empty stomach, at least 30 minutes before the first food, beverage, or other oral medications of the day with 4 oz of plain water only [provider] Taking Active             Patient Active Problem List   Diagnosis Date Noted   Hx of gout    Elevated PSA, less than 10 ng/ml 10/03/2017   Lumbar radiculopathy 10/03/2017   Drug-induced myopathy 10/03/2017   Obesity (BMI 30.0-34.9) 10/03/2017   History of pulmonary embolus (PE) 12/05/2016   Chronic pain of left knee 10/07/2016  CKD stage 3 due to type 2 diabetes mellitus ([place]) 10/07/2016   Muscle strain 11/17/2015   Low back pain 10/08/2015   Hx of hematuria 07/16/2015   Ganglion cyst of left foot 01/23/2015   Smokeless tobacco use 01/12/2015   Type 2 diabetes mellitus with other specified complication ([place]) 47/82/9562   Benign hypertension with CKD (chronic kidney disease) stage III ([place]) 10/16/2014   Hyperlipidemia associated with type 2 diabetes mellitus ([place]) 10/16/2014   Gout 10/16/2014   GERD without esophagitis 10/16/2014   Injury of tendon of upper extremity 05/29/2014   Glenoid labral tear 05/29/2014   Complete rotator cuff rupture of left shoulder 05/09/2014   Infraspinatus tenosynovitis 05/09/2014   Other synovitis and tenosynovitis, right shoulder 05/09/2014    Immunization History  Administered Date(s) Administered   PFIZER(Purple Top)SARS-COV-2 Vaccination 05/15/2019, 06/05/2019   Tdap 07/25/2016    Conditions to be addressed/monitored: CKD, HTN, HLD, and DMII  Care Plan : PharmD - Medication Management  Updates made by [name], RPH-CPP since 02/03/2021 12:00 AM     Problem: Disease Progression      Long-Range Goal: Disease Progression Prevented or Minimized   Start Date:  04/22/2020  Expected End Date: 07/21/2020  This Visit's Progress: On track  Recent Progress: On track  Priority: High  Note:   Current Barriers:  Financial Barriers - patient has [employer] and reports copay for Bydureon & insulin glargine is cost prohibitive at this time Patient APPROVED for patient assistance for [name] from [place] through through 03/27/2021 Patient APPROVED for patient assistance from [name] through 03/27/2021 Patient APPROVED to receive Rybelsus from [employer] patient assistance program through 03/27/21 Lack of blood sugar or blood pressure readings for clinical team  Pharmacist Clinical Goal(s):  Over the next 90 days, patient will achieve adherence to monitoring guidelines and medication adherence to achieve therapeutic efficacy through collaboration with PharmD and provider.   Interventions: 1:1 collaboration with [name], DO regarding development and update of comprehensive plan of care as evidenced by provider attestation and co-signature Inter-disciplinary care team collaboration (see longitudinal plan of care) Perform chart review. Patient seen for Office Visit with [facility] on 10/26 Today reports shoulder pain improved some after steroid injection, but still having pain. Will follow up with Orthopedic Surgery regarding MRI Reports occasionally has weakness in his legs.  Denies pain, just feel like energy in his legs is drained Encourage patient to discuss this with PCP at upcoming appointment  Type 2 Diabetes: Uncontrolled; current treatment: Rybelsus 7 mg daily ?30 minutes before the first food, beverage, or other oral medications of the day with ?4 oz of plain water (started on 10/10) [name] 34 units QHS (increased by 1 unit on 11/1) Previous therapies tried: Trulicity (GI intolerance) Metformin (unable to tolerate - GI side effects) Bydureon (difficulty with device/administration) Invokana  (felt dehydrated) Recent home blood sugar readings:  Morning Blood Sugar Notes  1 - November 314* *Received steroid injection on 10/26 Increased [name] to 34 units daily  2 - November 220   3 - November - *Did not monitor during trip  4 - November -   5 - November -   6 - November -   7 - November -   8 - November 207   9 - November 121    Reports previously tolerating Rybelsus well, but recently often feels bloated, particularly between meals Confirms taking doses  ?30 minutes before the first food, beverage, or other oral  medications of the day with ?4 oz of plain water only and then eating 30-60 minutes after the dose Counsel patient on strategies to improve tolerability including eating smaller but more frequent meals through the day, stop eating when full, avoiding fatty foods and trying blander foods when having side effects States would like to continue Rybelsus for now and see if bloating improves with eating smaller more frequent meals through the day Reports working on positive dietary changes to have well-balanced meals and reduce carbohydrate portion sizes Decreasing portion sizes at meals Increasing portions of non-starchy vegetables Counsel patient to check fasting CBG readings daily, including during travel, and follow directions for [name] dose adjustment if needed (1 unit weekly if fasting readings consistently >150) Have discussed benefits of continuous blood glucose monitor and offer to investigate health plan coverage for CBGM Patient declines for now, preferring to wait until after next A1C to discuss again Encouraged patient to use glucometer to check blood sugar 2 hours after meals (breakfast, lunch or supper) a couple of times/week to use as feedback on impact of dietary choices on blood sugar    Hypertension: Controlled; current treatment: Carvedilol 12.5 mg twice daily Lisinopril 40 mg daily  Reports last checked home BP on 11/1: 142/80, HR 64 Counsel  patient on blood pressure monitoring technique, including resting prior to readings Encourage patient to monitor home blood pressure, keep log of results, have to review at upcoming appointments, but to contact office sooner for readings outside of established parameters   Hyperlipidemia: Controlled; current treatment: Ezetimibe 10 mg daily Atorvastatin 10 mg daily Previous therapies tried: atorvastatin 40 mg daily (unable to tolerate due to myalgia) Have counseled on importance of cholesterol control and dietary considerations to help improve cholesterol and counsel to review nutrition labels  Tobacco Use: Denies interest in stopping use of chewing tobacco  Medication Assistance: Will collaborate with [facility] CPhT Susy Frizzle for assistance to patient with re-enrollment application for [employer] patient assistance for 2023   Patient Goals/Self-Care Activities Over the next 90 days, patient will:  - take medications as directed  Patient uses weekly pillbox to manage medications - check glucose, document, and provide at future appointments - check blood pressure, document, and provide at future appointments - attend medical appointments as scheduled  Next appointment with PCP on 11/22  Follow Up Plan: Telephone follow up appointment with care management team member scheduled for: 12/7 at 8:30 am      Patient's preferred pharmacy is:  [facility] [address], [place] [address] [place] [address] Phone: [phone] Fax: [phone]  [facility] [place], [place] [address] Phone: [phone] Fax: [phone]   Follow Up:  Patient agrees to Care Plan and Follow-up.  Wallace Cullens, PharmD, Para March, CPP Clinical Pharmacist [facility] [phone]

## 2021-02-03 NOTE — Patient Instructions (Signed)
Visit Information  Our goal A1c is less than 7%. This corresponds with fasting sugars less than 130 and 2 hour after meal sugars less than 180. Please check your fasting blood sugar each morning and keep a record of the results  Please check your blood pressure and keep record of the results  Our goal bad cholesterol, or LDL, is less than 70 . This is why it is important to continue taking your atorvastatin and ezetimibe  Feel free to call me with any questions or concerns. I look forward to our next call   Estelle Grumbles, PharmD, BCACP Clinical Pharmacist [facility] [phone]  The patient verbalized understanding of instructions, educational materials, and care plan provided today and declined offer to receive copy of patient instructions, educational materials, and care plan.   Telephone follow up appointment with care management team member scheduled for: 12/7 at 8:30 am

## 2021-02-15 ENCOUNTER — Telehealth: Payer: Self-pay | Admitting: Pharmacy Technician

## 2021-02-15 DIAGNOSIS — Z596 Low income: Secondary | ICD-10-CM

## 2021-02-15 NOTE — Progress Notes (Signed)
[facility] Customer service manager [facility])                                            [facility] Quality Pharmacy Team    02/15/2021  [name] [birth year] [id number]  FOR 2023 Re ENROLLMENT                                      Medication Assistance Referral  Referral From: [facility] Embedded RPh Vallery Sa   Medication/Company: [name] Patient application portion:  Mailed Provider application portion: Faxed  to Dr. Althea Charon Provider address/fax verified via: Office website  Medication/Company: [employer] Patient application portion:  Mining engineer portion: Faxed  to Dr. Althea Charon Provider address/fax verified via: Office website    [employer] Lisha Vitale, CPhT [facility] [employer]  [phone]

## 2021-02-16 ENCOUNTER — Other Ambulatory Visit: Payer: Self-pay

## 2021-02-16 ENCOUNTER — Encounter: Payer: Self-pay | Admitting: Family Medicine

## 2021-02-16 ENCOUNTER — Ambulatory Visit (INDEPENDENT_AMBULATORY_CARE_PROVIDER_SITE_OTHER): Payer: Medicare HMO | Admitting: Family Medicine

## 2021-02-16 ENCOUNTER — Other Ambulatory Visit: Payer: Self-pay | Admitting: Family Medicine

## 2021-02-16 VITALS — BP 129/68 | HR 77 | Ht 71.0 in | Wt 226.2 lb

## 2021-02-16 DIAGNOSIS — Z794 Long term (current) use of insulin: Secondary | ICD-10-CM

## 2021-02-16 DIAGNOSIS — E1169 Type 2 diabetes mellitus with other specified complication: Secondary | ICD-10-CM

## 2021-02-16 DIAGNOSIS — N183 Chronic kidney disease, stage 3 unspecified: Secondary | ICD-10-CM | POA: Diagnosis not present

## 2021-02-16 DIAGNOSIS — E785 Hyperlipidemia, unspecified: Secondary | ICD-10-CM

## 2021-02-16 DIAGNOSIS — I129 Hypertensive chronic kidney disease with stage 1 through stage 4 chronic kidney disease, or unspecified chronic kidney disease: Secondary | ICD-10-CM

## 2021-02-16 DIAGNOSIS — G720 Drug-induced myopathy: Secondary | ICD-10-CM | POA: Diagnosis not present

## 2021-02-16 LAB — POCT GLYCOSYLATED HEMOGLOBIN (HGB A1C): Hemoglobin A1C: 10.4 % — AB (ref 4.0–5.6)

## 2021-02-16 NOTE — Progress Notes (Signed)
Subjective:    Patient ID: [name], male    DOB: Jul 27, 1950, 70 y.o.   MRN: [id number]  [name] is a 70 y.o. male presenting on 02/16/2021 for Diabetes   HPI  CHRONIC DM, Type 2 with nephropathy / CKD-III Last visit 3 months ago A1c from 11.8 down to 10.4 (10/2020) He had a cortisone injection in shoulder about 30 days ago. He has had increase Insulin up to 35u nightly His CBG readings were 120-160 range at good times, then occasional 200s with more sweets admits Bday. Self discontinued Metformin due to nausea. Meds: - On Rybelsus 7mg  daily in AM - (OFF Bydureon) - On [name] 35 units nightly  - Remains off Metformin Reports  good compliance. Tolerating well w/o side-effects Currently on ACEi Lifestyle: - Diet (improved DM Diet, except now still increased sweets) - Exercise limited Due for DM Eye exam. Denies hypoglycemia, polyuria, visual changes, numbness or tingling.  HYPERLIPIDEMIA Drug Induced Myopathy Last time was on Atorvastatin 40mg  down to 10mg . Now takes Atorvastatin 10mg  daily and also on Zetia 10mg  daily. Doing much better less side effects and issues. - Lipid panel shows controlled normal Lipids TC 90 and LDL 36  Hypertension Controlled on current medication.   Depression screen [facility] 2/9 01/26/2021 11/10/2020 01/23/2020  Decreased Interest 0 0 0  Down, Depressed, Hopeless 0 0 0  PHQ - 2 Score 0 0 0  Altered sleeping - 0 0  Tired, decreased energy - 0 1  Change in appetite - 0 0  Feeling bad or failure about yourself  - 0 0  Trouble concentrating - 0 0  Moving slowly or fidgety/restless - 0 0  Suicidal thoughts - 0 0  PHQ-9 Score - 0 1  Difficult doing work/chores - Not difficult at all Not difficult at all    Social History   Tobacco Use   Smoking status: Former    Types: Cigars    Quit date: 2000    Years since quitting: 22.9   Smokeless tobacco: Current    Types: Chew   Tobacco comments:    wife states patient never  smoked cigarettes, just chewing tobacco  Vaping Use   Vaping Use: Never used  Substance Use Topics   Alcohol use: Not Currently    Alcohol/week: 0.0 standard drinks   Drug use: No    Review of Systems Per HPI unless specifically indicated above     Objective:    BP 129/68   Pulse 77   Ht 5\' 11"  (1.803 m)   Wt 226 lb 3.2 oz (102.6 kg)   SpO2 100%   BMI 31.55 kg/m   Wt Readings from Last 3 Encounters:  02/16/21 226 lb 3.2 oz (102.6 kg)  01/26/21 231 lb (104.8 kg)  11/10/20 230 lb (104.3 kg)    Physical Exam Vitals and nursing note reviewed.  Constitutional:      General: He is not in acute distress.    Appearance: Normal appearance. He is well-developed. He is not diaphoretic.     Comments: Well-appearing, comfortable, cooperative  HENT:     Head: Normocephalic and atraumatic.  Eyes:     General:        Right eye: No discharge.        Left eye: No discharge.     Conjunctiva/sclera: Conjunctivae normal.  Cardiovascular:     Rate and Rhythm: Normal rate.  Pulmonary:     Effort: Pulmonary effort is normal.  Skin:  General: Skin is warm and dry.     Findings: No erythema or rash.  Neurological:     Mental Status: He is alert and oriented to person, place, and time.  Psychiatric:        Mood and Affect: Mood normal.        Behavior: Behavior normal.        Thought Content: Thought content normal.     Comments: Well groomed, good eye contact, normal speech and thoughts    Recent Labs    07/10/20 1007 11/03/20 0813 02/16/21 1421  HGBA1C 10.1* 11.8* 10.4*     Results for orders placed or performed in visit on 02/16/21  POCT HgB A1C  Result Value Ref Range   Hemoglobin A1C 10.4 (A) 4.0 - 5.6 %      Assessment & Plan:   Problem List Items Addressed This Visit     Type 2 diabetes mellitus with other specified complication ([place]) - Primary    Improved A1c to 10.4 Note he had steroid injection recently Improving lifestyle On Rybelsus now oral GLP1 No  hypoglycemia Complications - CKD-III nephropathy, other 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 Contiue Rybelsus 7mg  daily before 1st meal, avoid other meds with it. We discussed next move will be dose increase to 14 mg in future, he can consider taking DOUBLE dose for 7mg  x 2 = 14mg  in future after he has adjusted insulin and improved diet. Notify me if need new order sent to company for 14mg  2. Increase [name] from 35 up to 40 u gradually, as fasting CBG >150 consistently  Encourage improved lifestyle - low carb, low sugar diet, reduce portion size, continue improving regular exercise Check CBG, bring log to next visit for review Continue ACEi      Relevant Orders   POCT HgB A1C (Completed)   Hyperlipidemia associated with type 2 diabetes mellitus ([place])    Last lab had been controlled on statin + Zetia 10/2020 Now off both due to myalgia drug myopathy Failed Rosuvastatin DISCONTINUE Atorvastatin 10mg , unable to tolerate any dose at daily dosing.  RESTART Zetia 10mg  daily for now  We can consider alternative statin such as Pravastatin or intermittent dosing in future or new med Nexletol or PCSK9 inhibitor injections       Drug-induced myopathy    Secondary to side effect on statin (failed rosuvastatin, crestor, lipitor in past)  Remain off statin now, restart Zetia      Benign hypertension with CKD (chronic kidney disease) stage III ([facility])    Well-controlled HTN Complication with CKD-III - stable to improved Cr Followed by [facility] Dr Candiss Norse   Plan:  1. Continue current BP regimen - Lisinopril 40mg  daily, Carvedilol 12.5mg  BID 2. Encourage improved lifestyle - low sodium diet, regular exercise  improve as tolerated 3. Continue monitor BP outside office, bring readings to next visit, if persistently >140/90 or new symptoms notify office sooner          No orders of the defined types were placed in this encounter.     Follow up plan: Return in about 5 months (around 07/17/2021) for 5 month fasting lab only then 1 week later Follow-up Lab results DM, Cholesterol.  Future labs ordered for 06/2021 A1c Lipid  Nobie Putnam, [facility] 02/16/2021, 2:21 PM

## 2021-02-16 NOTE — Assessment & Plan Note (Signed)
Last lab had been controlled on statin + Zetia 10/2020 Now off both due to myalgia drug myopathy Failed Rosuvastatin DISCONTINUE Atorvastatin 10mg , unable to tolerate any dose at daily dosing.  RESTART Zetia 10mg  daily for now  We can consider alternative statin such as Pravastatin or intermittent dosing in future or new med Nexletol or PCSK9 inhibitor injections

## 2021-02-16 NOTE — Assessment & Plan Note (Signed)
Improved A1c to 10.4 Note he had steroid injection recently Improving lifestyle On Rybelsus now oral GLP1 No hypoglycemia Complications - CKD-III nephropathy, other neuropathy from spine but less likely DM neuropathy, other including hyperlipidemia, GERD, obesity - increases risk of future cardiovascular complications  Off Tradjenta, duplicate therapy/cost, Invokana Failed Trulicity 0.75 and 1.5mg  weekly d/t GI intolerance OFF Metformin due to AKI - and GI intolerance  Plan:  1. Contiue Rybelsus 7mg  daily before 1st meal, avoid other meds with it. We discussed next move will be dose increase to 14 mg in future, he can consider taking DOUBLE dose for 7mg  x 2 = 14mg  in future after he has adjusted insulin and improved diet. Notify me if need new order sent to company for 14mg  2. Increase [name] from 35 up to 40 u gradually, as fasting CBG >150 consistently  Encourage improved lifestyle - low carb, low sugar diet, reduce portion size, continue improving regular exercise Check CBG, bring log to next visit for review Continue ACEi

## 2021-02-16 NOTE — Assessment & Plan Note (Signed)
Secondary to side effect on statin (failed rosuvastatin, crestor, lipitor in past)  Remain off statin now, restart Zetia

## 2021-02-16 NOTE — Assessment & Plan Note (Signed)
Well-controlled HTN Complication with CKD-III - stable to improved Cr Followed by [facility] Dr Thedore Mins   Plan:  1. Continue current BP regimen - Lisinopril 40mg  daily, Carvedilol 12.5mg  BID 2. Encourage improved lifestyle - low sodium diet, regular exercise improve as tolerated 3. Continue monitor BP outside office, bring readings to next visit, if persistently >140/90 or new symptoms notify office sooner

## 2021-02-16 NOTE — Patient Instructions (Addendum)
Thank you for coming to the office today.  Since you had muscle problems and weakness on the Statin cholesterol pills, we can try one experiment.  STOP / Remain off Atorvastatin.  RESTART Zetia 10mg  nightly. Try this one to see if it is tolerable and if it causes any side effects. If this work we can continue on this longer, if it doesn't work, we may need to explore newer cholesterol med options.  You can start back on this every OTHER day to see if this helps.  Recent Labs    07/10/20 1007 11/03/20 0813 02/16/21 1421  HGBA1C 10.1* 11.8* 10.4*   Gradually may inch up on insulin up to 40 u max.  For the Rybelsus 7mg  daily, if still not improving sugars by first of year January 2023, then next move is going to be INCREASE Rybelsus to 14 (take two pills instead of 1) we can order higher dose from company  DUE for FASTING BLOOD WORK (no food or drink after midnight before the lab appointment, only water or coffee without cream/sugar on the morning of)  SCHEDULE "Lab Only" visit in the morning at the clinic for lab draw in 5 MONTHS   - Make sure Lab Only appointment is at about 1 week before your next appointment, so that results will be available  For Lab Results, once available within 2-3 days of blood draw, you can can log in to MyChart online to view your results and a brief explanation. Also, we can discuss results at next follow-up visit.   Please schedule a Follow-up Appointment to: Return in about 5 months (around 07/17/2021) for 5 month fasting lab only then 1 week later Follow-up Lab results DM, Cholesterol.  If you have any other questions or concerns, please feel free to call the office or send a message through MyChart. You may also schedule an earlier appointment if necessary.  Additionally, you may be receiving a survey about your experience at our office within a few days to 1 week by e-mail or mail. We value your feedback.  08-12-2003, DO [facility], Saralyn Pilar

## 2021-02-24 DIAGNOSIS — E1169 Type 2 diabetes mellitus with other specified complication: Secondary | ICD-10-CM

## 2021-02-24 DIAGNOSIS — E785 Hyperlipidemia, unspecified: Secondary | ICD-10-CM

## 2021-02-24 DIAGNOSIS — I129 Hypertensive chronic kidney disease with stage 1 through stage 4 chronic kidney disease, or unspecified chronic kidney disease: Secondary | ICD-10-CM

## 2021-02-24 DIAGNOSIS — Z794 Long term (current) use of insulin: Secondary | ICD-10-CM

## 2021-02-24 DIAGNOSIS — N183 Chronic kidney disease, stage 3 unspecified: Secondary | ICD-10-CM

## 2021-03-03 ENCOUNTER — Ambulatory Visit: Payer: Medicare HMO | Admitting: Pharmacist

## 2021-03-03 DIAGNOSIS — Z794 Long term (current) use of insulin: Secondary | ICD-10-CM

## 2021-03-03 DIAGNOSIS — E785 Hyperlipidemia, unspecified: Secondary | ICD-10-CM

## 2021-03-03 DIAGNOSIS — E1169 Type 2 diabetes mellitus with other specified complication: Secondary | ICD-10-CM

## 2021-03-03 DIAGNOSIS — G720 Drug-induced myopathy: Secondary | ICD-10-CM

## 2021-03-03 NOTE — Chronic Care Management (AMB) (Signed)
Chronic Care Management CCM Pharmacy Note  03/03/2021 Name:  [name] MRN:  [id number] DOB:  Feb 02, 1951   Subjective: [name] is an 70 y.o. year old male who is a primary patient of [name], DO.  The CCM team was consulted for assistance with disease management and care coordination needs.    Engaged with patient by telephone for follow up visit for pharmacy case management and/or care coordination services.   Objective:  Medications Reviewed Today     Reviewed by [name], DO (Physician) on 02/16/21 at 1420  Med List Status: <None>   Medication Order Taking? Sig Documenting Provider Last Dose Status Informant  ACCU-CHEK SMARTVIEW test strip [id number]  TEST BLOOD SUGAR TWICE DAILY [name], DO  Active   acetaminophen (TYLENOL) 500 MG tablet [id number]  Take 1,000 mg by mouth every 8 (eight) hours as needed.  [provider]  Active Spouse/Significant Other  allopurinol (ZYLOPRIM) 100 MG tablet [id number]  TAKE 1 TABLET AT BEDTIME [name], DO  Active   aspirin EC 81 MG EC tablet [id number]  Take 1 tablet (81 mg total) by mouth daily. Swallow whole. Alford Highland, MD  Active   atorvastatin (LIPITOR) 10 MG tablet [id number] No Take 1 tablet (10 mg total) by mouth daily.  Patient not taking: Reported on 02/16/2021   [name], DO Not Taking Active   BRILINTA 90 MG TABS tablet [id number]  TAKE 1 TABLET (90 MG TOTAL) BY MOUTH 2 (TWO) TIMES DAILY. Karamalegos, Netta Neat, DO  Active   calcium carbonate (TUMS - DOSED IN MG ELEMENTAL CALCIUM) 500 MG chewable tablet [id number] No Chew 2 tablets by mouth at bedtime as needed for indigestion or heartburn.  Patient not taking: Reported on 01/26/2021   [provider] Not Taking Active   carvedilol (COREG) 12.5 MG tablet [id number]  TAKE 1 TABLET TWICE DAILY [name], DO  Active   DROPLET PEN NEEDLES 32G X 4 MM MISC  [id number]  USE WITH [name] INSULIN INJECTION DAILY AS INSTRUCTED [name], DO  Active   ezetimibe (ZETIA) 10 MG tablet [id number]  TAKE 1 TABLET (10 MG TOTAL) BY MOUTH DAILY. Karamalegos, Netta Neat, DO  Active   fluticasone (FLONASE) 50 MCG/ACT nasal spray [id number]  USE 2 SPRAYS IN EACH NOSTRIL ONCE DAILY USE FOR 4 TO 6 WEEKS, THEN STOP AND USE SEASONALLY OR AS NEEDED. Karamalegos, Netta Neat, DO  Active   gabapentin (NEURONTIN) 100 MG capsule [id number]  Start 1 capsule daily, increase by 1 cap every 2-3 days as tolerated up to 3 times a day, or may take 3 at once in evening.  Patient not taking: Reported on 01/26/2021   [name], DO  Active   Insulin Glargine [facility]) 100 UNIT/ML [id number]  Inject 32 Units into the skin at bedtime.  Patient taking differently: Inject 34 Units into the skin at bedtime.   [name], DO  Active   lisinopril (ZESTRIL) 40 MG tablet [id number]  TAKE 1 TABLET EVERY DAY Karamalegos, Alexander Shela Commons, DO  Active   nitroGLYCERIN (NITROSTAT) 0.4 MG SL tablet [id number]  Place 1 tablet (0.4 mg total) under the tongue every 5 (five) minutes as needed for chest pain. Alford Highland, MD  Active   omeprazole (PRILOSEC) 20 MG capsule [id number]  TAKE 1 CAPSULE EVERY DAY BEFORE BREAKFAST Karamalegos, Netta Neat, DO  Active   Semaglutide (RYBELSUS) 7 MG TABS [id number]  Take 1 tablet (7  mg) by mouth daily on an empty stomach, at least 30 minutes before the first food, beverage, or other oral medications of the day with 4 oz of plain water only [provider]  Active             Pertinent Labs:  Lab Results  Component Value Date   HGBA1C 10.4 (A) 02/16/2021   Lab Results  Component Value Date   CHOL 90 11/03/2020   HDL 32 (L) 11/03/2020   LDLCALC 36 11/03/2020   TRIG 141 11/03/2020   CHOLHDL 2.8 11/03/2020   Lab Results  Component Value Date   CREATININE 1.57 (H) 11/03/2020   BUN 26 (H) 11/03/2020    NA 135 11/03/2020   K 4.5 11/03/2020   CL 102 11/03/2020   CO2 25 11/03/2020    SDOH:  (Social Determinants of Health) assessments and interventions performed:    CCM Care Plan  Review of patient past medical history, allergies, medications, health status, including review of consultants reports, laboratory and other test data, was performed as part of comprehensive evaluation and provision of chronic care management services.   Care Plan : PharmD - Medication Management  Updates made by Manuela Neptune, RPH-CPP since 03/03/2021 12:00 AM     Problem: Disease Progression      Long-Range Goal: Disease Progression Prevented or Minimized   Start Date: 04/22/2020  Expected End Date: 07/21/2020  This Visit's Progress: On track  Recent Progress: On track  Priority: High  Note:   Current Barriers:  Financial Barriers - patient has [employer] and reports copay for Bydureon & insulin glargine is cost prohibitive at this time Patient APPROVED for patient assistance for [name] from [name] through through 03/27/2021 Patient APPROVED to receive Rybelsus from [employer] patient assistance program through 03/27/21 Lack of blood sugar or blood pressure readings for clinical team  Pharmacist Clinical Goal(s):  Over the next 90 days, patient will achieve adherence to monitoring guidelines and medication adherence to achieve therapeutic efficacy through collaboration with PharmD and provider.   Interventions: 1:1 collaboration with [name], DO regarding development and update of comprehensive plan of care as evidenced by provider attestation and co-signature Inter-disciplinary care team collaboration (see longitudinal plan of care) Perform chart review. Patient seen for Office Visit with PCP on 11/22 for follow up A1C 10.4% Provider advised: Increase [name] from 35 up to 40 u gradually, as fasting CBG >150 consistently DISCONTINUE Atorvastatin 10mg , unable  to tolerate any dose at daily dosing RESTART Zetia 10mg  daily for now  Type 2 Diabetes: Uncontrolled; current treatment: Rybelsus 7 mg daily ?30 minutes before the first food, beverage, or other oral medications of the day with ?4 oz of plain water (started on 10/10) Reports tolerating well [name] 35 units QHS Previous therapies tried: Trulicity (GI intolerance) Metformin (unable to tolerate - GI side effects) Bydureon (difficulty with device/administration) Invokana (felt dehydrated) Unable to review specific readings today as patient not home, but reports recent morning fasting blood sugar readings ranging: 120s-150s Reports has made positive dietary changes to have well-balanced meals and reduce carbohydrate portion sizes Spreading meals throughout the day - adding small snack of crackers + peanut butter between lunch and supper to help have smaller portion size at supper Reduced snacking/sweets overnight Counsel patient to continue checking fasting CBG readings daily and follow directions for [name] dose adjustment if needed (1 unit weekly if fasting readings consistently >150) Have discussed benefits of continuous blood glucose monitor and offered to  investigate health plan coverage for CBGM Patient has declined for now Have encouraged patient to use glucometer to check blood sugar 2 hours after meals (breakfast, lunch or supper) a couple of times/week to use as feedback on impact of dietary choices on blood sugar   Hyperlipidemia: Controlled; current treatment: Ezetimibe 10 mg daily - reports currently taking every other day No longer taking atorvastatin Previous therapies tried: atorvastatin 40 mg daily & 10 mg daily (unable to tolerate due to myalgia) Reports muscle symptoms improved since stopped taking atorvastatin Encourage patient to restart taking ezetimibe 10 mg daily Discuss importance of LDL control and prevention of cardiovascular events. Patient to discuss with  Cardiologist as well at upcoming appointment on 12/27   Medication Assistance: Continue to collaborate with [facility] Noreene Larsson [place] for assistance to patient with re-enrollment application for [employer] patient assistance for 2023 Patient's wife reports received re-enrollment applications, completed and mailed back to [facility] last week   Patient Goals/Self-Care Activities Over the next 90 days, patient will:  - take medications as directed  Patient uses weekly pillbox to manage medications - check glucose, document, and provide at future appointments - check blood pressure, document, and provide at future appointments - attend medical appointments as scheduled  Next appointment with Cardiology on 12/27  Follow Up Plan: Telephone follow up appointment with care management team member scheduled for: 04/02/2021 at 8:30 AM        Estelle Grumbles, PharmD, Patsy [place], CPP Clinical Pharmacist [facility] [phone]

## 2021-03-03 NOTE — Patient Instructions (Signed)
Visit Information  Thank you for taking time to visit with me today. Please don't hesitate to contact me if I can be of assistance to you before our next scheduled telephone appointment.  Following are the goals we discussed today:  Our goal A1c is less than 7%. This corresponds with fasting sugars less than 130 and 2 hour after meal sugars less than 180. Please check your fasting blood sugar each morning and keep a record of the results  Please check your blood pressure and keep record of the results  Our goal bad cholesterol, or LDL, is less than 70 . This is why it is important to continue taking your atorvastatin and ezetimibe  Feel free to call me with any questions or concerns. I look forward to our next call   Estelle Grumbles, PharmD, BCACP Clinical Pharmacist [facility] [phone]  Our next appointment is by telephone on 04/02/2021 at 8:30 AM   Please call the care guide team at [phone] if you need to cancel or reschedule your appointment.    The patient verbalized understanding of instructions, educational materials, and care plan provided today and declined offer to receive copy of patient instructions, educational materials, and care plan.

## 2021-03-08 ENCOUNTER — Telehealth: Payer: Self-pay | Admitting: Pharmacy Technician

## 2021-03-08 DIAGNOSIS — Z596 Low income: Secondary | ICD-10-CM

## 2021-03-08 NOTE — Progress Notes (Signed)
[name]    03/08/2021  [name] May 16, 1950 [id number]  Received both patient and provider portion(s) of patient assistance application(s) for [name] and Rybelsus. Faxed completed application and required documents into [employer] respectively.  [name], CPhT [facility] [employer]  [phone]

## 2021-03-18 DIAGNOSIS — H524 Presbyopia: Secondary | ICD-10-CM | POA: Diagnosis not present

## 2021-03-18 DIAGNOSIS — H353131 Nonexudative age-related macular degeneration, bilateral, early dry stage: Secondary | ICD-10-CM | POA: Diagnosis not present

## 2021-03-18 LAB — HM DIABETES EYE EXAM

## 2021-03-23 ENCOUNTER — Ambulatory Visit: Payer: Medicare HMO | Admitting: Cardiology

## 2021-03-23 ENCOUNTER — Other Ambulatory Visit: Payer: Self-pay

## 2021-03-23 ENCOUNTER — Encounter: Payer: Self-pay | Admitting: Cardiology

## 2021-03-23 VITALS — BP 130/62 | HR 77 | Ht 71.0 in | Wt 233.1 lb

## 2021-03-23 DIAGNOSIS — Z720 Tobacco use: Secondary | ICD-10-CM

## 2021-03-23 DIAGNOSIS — I10 Essential (primary) hypertension: Secondary | ICD-10-CM

## 2021-03-23 DIAGNOSIS — I2510 Atherosclerotic heart disease of native coronary artery without angina pectoris: Secondary | ICD-10-CM

## 2021-03-23 DIAGNOSIS — E7800 Pure hypercholesterolemia, unspecified: Secondary | ICD-10-CM | POA: Diagnosis not present

## 2021-03-23 MED ORDER — CLOPIDOGREL BISULFATE 75 MG PO TABS: 75.0000 mg | ORAL_TABLET | Freq: Every day | ORAL | 3 refills | Status: DC

## 2021-03-23 MED ORDER — ISOSORBIDE MONONITRATE ER 30 MG PO TB24: 15.0000 mg | ORAL_TABLET | Freq: Every day | ORAL | 3 refills | Status: DC

## 2021-03-23 NOTE — Patient Instructions (Signed)
Medication Instructions:  Your physician has recommended you make the following change in your medication:   STOP taking Brilinta   START taking Plavix 75 mg daily  START taking Imdur 15 mg (1/2 tablet) daily   *If you need a refill on your cardiac medications before your next appointment, please call your pharmacy*   Lab Work: None ordered  If you have labs (blood work) drawn today and your tests are completely normal, you will receive your results only by: MyChart Message (if you have MyChart) OR A paper copy in the mail If you have any lab test that is abnormal or we need to change your treatment, we will call you to review the results.   Testing/Procedures: None ordered   Follow-Up: At [facility], you and your health needs are our priority.  As part of our continuing mission to provide you with exceptional heart care, we have created designated Provider Care Teams.  These Care Teams include your primary Cardiologist (physician) and Advanced Practice Providers (APPs -  Physician Assistants and Nurse Practitioners) who all work together to provide you with the care you need, when you need it.  We recommend signing up for the patient portal called "MyChart".  Sign up information is provided on this After Visit Summary.  MyChart is used to connect with patients for Virtual Visits (Telemedicine).  Patients are able to view lab/test results, encounter notes, upcoming appointments, etc.  Non-urgent messages can be sent to your provider as well.   To learn more about what you can do with MyChart, go to [email or web address].    Your next appointment:   3 month(s)  The format for your next appointment:   In Person  Provider:   You may see Debbe Odea, MD or one of the following Advanced Practice Providers on your designated Care Team:   Nicolasa Ducking, NP Eula Listen, PA-C Cadence Fransico Michael, PA-C  :1}    Other Instructions N/A

## 2021-03-23 NOTE — Progress Notes (Signed)
Cardiology Office Note:    Date:  03/23/2021   ID:  [name], DOB 1950/07/22, MRN [id number]  PCP:  [name], DO  Cardiologist:  Debbe Odea, MD  Electrophysiologist:  None   Referring MD: Saralyn Pilar *   Chief Complaint  Patient presents with   Other    6 Month f/u c/o chest discomfort. Meds reviewed verbally with pt.     History of Present Illness:    [name] is a 70 y.o. male with a hx of CAD/NSTEMI (PCI/DES to RCA 12/2019), hypertension, hyperlipidemia, CKD-3, GERD, PE treated with Eliquis, chews tobacco who presents for follow-up.    Being seen due to CAD and angina.  Carvedilol previously increased to 12.5 mg twice daily due to symptoms of angina.  He states still having occasional chest discomfort, not related with exertion.  Symptoms lasted a couple of seconds, typically resolves before he could obtain sublingual nitro.  He also complains of easy bruising.  He works in [employer].  He otherwise has no other concerns at this time.   Prior notes Echocardiogram 12/2019, normal ejection fraction 60 to 65%. Left heart cath 12/30/2019 proximal RCA 90%, proximal LAD 40%, moderate left circumflex disease.  Status post successful angioplasty and drug-eluting stent placement to proximal RCA.  Past Medical History:  Diagnosis Date   Allergy    Chronic headaches    GERD (gastroesophageal reflux disease)    Heart burn    Hyperlipidemia    Hypertension     Past Surgical History:  Procedure Laterality Date   BACK SURGERY  02/26/2019   KNEE SURGERY     LEFT HEART CATH AND CORONARY ANGIOGRAPHY N/A 12/30/2019   Procedure: LEFT HEART CATH AND CORONARY ANGIOGRAPHY;  Surgeon: Iran Ouch, MD;  Location: [facility] INVASIVE CV LAB;  Service: Cardiovascular;  Laterality: N/A;   SHOULDER SURGERY     TONSILLECTOMY      Current Medications: Current Meds  Medication Sig   ACCU-CHEK SMARTVIEW test strip TEST BLOOD SUGAR TWICE DAILY    acetaminophen (TYLENOL) 500 MG tablet Take 1,000 mg by mouth every 8 (eight) hours as needed.    allopurinol (ZYLOPRIM) 100 MG tablet TAKE 1 TABLET AT BEDTIME   aspirin EC 81 MG EC tablet Take 1 tablet (81 mg total) by mouth daily. Swallow whole.   calcium carbonate (TUMS - DOSED IN MG ELEMENTAL CALCIUM) 500 MG chewable tablet Chew 2 tablets by mouth at bedtime as needed for indigestion or heartburn.   carvedilol (COREG) 12.5 MG tablet TAKE 1 TABLET TWICE DAILY   clopidogrel (PLAVIX) 75 MG tablet Take 1 tablet (75 mg total) by mouth daily.   DROPLET PEN NEEDLES 32G X 4 MM MISC USE WITH [name] INSULIN INJECTION DAILY AS INSTRUCTED   ezetimibe (ZETIA) 10 MG tablet TAKE 1 TABLET (10 MG TOTAL) BY MOUTH DAILY.   fluticasone (FLONASE) 50 MCG/ACT nasal spray USE 2 SPRAYS IN EACH NOSTRIL ONCE DAILY USE FOR 4 TO 6 WEEKS, THEN STOP AND USE SEASONALLY OR AS NEEDED.   Insulin Glargine ([name] KWIKPEN) 100 UNIT/ML Inject 32 Units into the skin at bedtime. (Patient taking differently: Inject 36 Units into the skin at bedtime.)   isosorbide mononitrate (IMDUR) 30 MG 24 hr tablet Take 0.5 tablets (15 mg total) by mouth daily.   lisinopril (ZESTRIL) 40 MG tablet TAKE 1 TABLET EVERY DAY   nitroGLYCERIN (NITROSTAT) 0.4 MG SL tablet Place 1 tablet (0.4 mg total) under the tongue every 5 (five) minutes as  needed for chest pain.   omeprazole (PRILOSEC) 20 MG capsule TAKE 1 CAPSULE EVERY DAY BEFORE BREAKFAST   Semaglutide (RYBELSUS) 7 MG TABS Take 1 tablet (7 mg) by mouth daily on an empty stomach, at least 30 minutes before the first food, beverage, or other oral medications of the day with 4 oz of plain water only   [DISCONTINUED] BRILINTA 90 MG TABS tablet TAKE 1 TABLET (90 MG TOTAL) BY MOUTH 2 (TWO) TIMES DAILY.     Allergies:   Crestor [rosuvastatin calcium], Invokana [canagliflozin], Prednisone, and Sulfamethizole   Social History   Socioeconomic History   Marital status: Married    Spouse name: Not on  file   Number of children: Not on file   Years of education: Not on file   Highest education level: Not on file  Occupational History   Occupation: retired  Tobacco Use   Smoking status: Former    Types: Cigars    Quit date: 2000    Years since quitting: 23.0   Smokeless tobacco: Current    Types: Chew   Tobacco comments:    wife states patient never smoked cigarettes, just chewing tobacco  Vaping Use   Vaping Use: Never used  Substance and Sexual Activity   Alcohol use: Not Currently    Alcohol/week: 0.0 standard drinks   Drug use: No   Sexual activity: Yes    Birth control/protection: Inserts  Other Topics Concern   Not on file  Social History Narrative   Not on file   Social Determinants of Health   Financial Resource Strain: Low Risk    Difficulty of Paying Living Expenses: Not hard at all  Food Insecurity: No Food Insecurity   Worried About Programme researcher, broadcasting/film/video in the Last Year: Never true   Ran Out of Food in the Last Year: Never true  Transportation Needs: No Transportation Needs   Lack of Transportation (Medical): No   Lack of Transportation (Non-Medical): No  Physical Activity: Inactive   Days of Exercise per Week: 0 days   Minutes of Exercise per Session: 0 min  Stress: No Stress Concern Present   Feeling of Stress : Not at all  Social Connections: Not on file     Family History: The patient's family history includes Cancer in his father; Heart disease in his brother and sister. There is no history of Prostate cancer.  ROS:   Please see the history of present illness.     All other systems reviewed and are negative.  EKGs/Labs/Other Studies Reviewed:    The following studies were reviewed today:   EKG:  EKG is ordered today.  EKG shows normal sinus rhythm, old anteroseptal infarct  Recent Labs: 11/03/2020: ALT 17; BUN 26; Creat 1.57; Hemoglobin 13.1; Platelets 246; Potassium 4.5; Sodium 135; TSH 3.16  Recent Lipid Panel    Component Value  Date/Time   CHOL 90 11/03/2020 0813   CHOL 212 (H) 11/12/2019 0829   TRIG 141 11/03/2020 0813   HDL 32 (L) 11/03/2020 0813   HDL 32 (L) 11/12/2019 0829   CHOLHDL 2.8 11/03/2020 0813   VLDL 37 12/29/2019 0026   LDLCALC 36 11/03/2020 0813    Physical Exam:    VS:  BP 130/62 (BP Location: Left Arm, Patient Position: Sitting, Cuff Size: Normal)    Pulse 77    Ht 5\' 11"  (1.803 m)    Wt 233 lb 2 oz (105.7 kg)    SpO2 97%    BMI 32.51 kg/m  Wt Readings from Last 3 Encounters:  03/23/21 233 lb 2 oz (105.7 kg)  02/16/21 226 lb 3.2 oz (102.6 kg)  01/26/21 231 lb (104.8 kg)     GEN:  Well nourished, well developed in no acute distress HEENT: Normal NECK: No JVD; No carotid bruits LYMPHATICS: No lymphadenopathy CARDIAC: RRR, no murmurs, rubs, gallops RESPIRATORY:  Clear to auscultation without rales, wheezing or rhonchi  ABDOMEN: Soft, non-tender, non-distended MUSCULOSKELETAL:  No edema; No deformity  SKIN: Warm and dry NEUROLOGIC:  Alert and oriented x 3 PSYCHIATRIC:  Normal affect   ASSESSMENT:    1. Coronary artery disease involving native coronary artery of native heart, unspecified whether angina present   2. Primary hypertension   3. Pure hypercholesterolemia   4. Tobacco chew use     PLAN:    History of CAD/NSTEMI status post PCI to RCA (12/30/2019).  Occasional chest pain.  Easy bruisability with Brilinta.  Stop Brilinta, start Plavix.start Imdur 15 mg daily for antianginal benefit.  Continue aspirin, Coreg, Zetia.  Last echo with EF 60%. History of hypertension, BP controlled.  Continue Coreg, lisinopril.  hyperlipidemia, continue Zetia.  History of myalgias with statins. Patient chews tobacco.  Cessation advised  Follow-up in 3 months  Total encounter time 40 minutes  Greater than 50% was spent in counseling and coordination of care with the patient   Medication Adjustments/Labs and Tests Ordered: Current medicines are reviewed at length with the patient  today.  Concerns regarding medicines are outlined above.  No orders of the defined types were placed in this encounter.   Meds ordered this encounter  Medications   clopidogrel (PLAVIX) 75 MG tablet    Sig: Take 1 tablet (75 mg total) by mouth daily.    Dispense:  90 tablet    Refill:  3   isosorbide mononitrate (IMDUR) 30 MG 24 hr tablet    Sig: Take 0.5 tablets (15 mg total) by mouth daily.    Dispense:  45 tablet    Refill:  3     Patient Instructions  Medication Instructions:  Your physician has recommended you make the following change in your medication:   STOP taking Brilinta   START taking Plavix 75 mg daily  START taking Imdur 15 mg (1/2 tablet) daily   *If you need a refill on your cardiac medications before your next appointment, please call your pharmacy*   Lab Work: None ordered  If you have labs (blood work) drawn today and your tests are completely normal, you will receive your results only by: MyChart Message (if you have MyChart) OR A paper copy in the mail If you have any lab test that is abnormal or we need to change your treatment, we will call you to review the results.   Testing/Procedures: None ordered   Follow-Up: At [facility], you and your health needs are our priority.  As part of our continuing mission to provide you with exceptional heart care, we have created designated Provider Care Teams.  These Care Teams include your primary Cardiologist (physician) and Advanced Practice Providers (APPs -  Physician Assistants and Nurse Practitioners) who all work together to provide you with the care you need, when you need it.  We recommend signing up for the patient portal called "MyChart".  Sign up information is provided on this After Visit Summary.  MyChart is used to connect with patients for Virtual Visits (Telemedicine).  Patients are able to view lab/test results, encounter notes, upcoming appointments, etc.  Non-urgent messages can be sent  to your provider as well.   To learn more about what you can do with MyChart, go to [email or web address].    Your next appointment:   3 month(s)  The format for your next appointment:   In Person  Provider:   You may see Debbe Odea, MD or one of the following Advanced Practice Providers on your designated Care Team:   Nicolasa Ducking, NP Eula Listen, PA-C Cadence Fransico Michael, PA-C  :1}    Other Instructions N/A    Signed, Debbe Odea, MD  03/23/2021 12:09 PM    [facility]

## 2021-03-24 DIAGNOSIS — H2512 Age-related nuclear cataract, left eye: Secondary | ICD-10-CM | POA: Diagnosis not present

## 2021-03-24 NOTE — Addendum Note (Signed)
Addended by: Festus Aloe on: 03/24/2021 11:07 AM   Modules accepted: Orders

## 2021-03-25 ENCOUNTER — Encounter: Payer: Self-pay | Admitting: Family Medicine

## 2021-03-25 ENCOUNTER — Ambulatory Visit: Payer: Self-pay | Admitting: *Deleted

## 2021-03-25 DIAGNOSIS — M109 Gout, unspecified: Secondary | ICD-10-CM

## 2021-03-25 MED ORDER — MITIGARE 0.6 MG PO CAPS: ORAL_CAPSULE | ORAL | 2 refills | Status: DC

## 2021-03-25 MED ORDER — COLCHICINE 0.6 MG PO TABS: ORAL_TABLET | ORAL | 2 refills | Status: DC

## 2021-03-25 NOTE — Telephone Encounter (Signed)
°  Chief Complaint: Gout flare up to left ankle Symptoms: Pain,swelling and redness Frequency: Started 2 days Pertinent Negatives: Patient denies  Disposition: [] ED /[] Urgent Care (no appt availability in office) / [] Appointment(In office/virtual)/ []  [facility]/ [] Home Care/ [] Refused Recommended Disposition  Additional Notes: Pt. States "Dr. has given me colchicine in the past for my gout and usually an anti=inflammatory with it." Request medication go to [facility] on . Please advise pt.    Answer Assessment - Initial Assessment Questions 1. ONSET: "When did the pain start?"      2 days ago 2. LOCATION: "Where is the pain located?"      Left ankle 3. PAIN: "How bad is the pain?"    (Scale 1-10; or mild, moderate, severe)  - MILD (1-3): doesn't interfere with normal activities.   - MODERATE (4-7): interferes with normal activities (e.g., work or school) or awakens from sleep, limping.   - SEVERE (8-10): excruciating pain, unable to do any normal activities, unable to walk.      Severe 4. WORK OR EXERCISE: "Has there been any recent work or exercise that involved this part of the body?"      No 5. CAUSE: "What do you think is causing the ankle pain?"     Gout 6. OTHER SYMPTOMS: "Do you have any other symptoms?" (e.g., calf pain, rash, fever, swelling)     Swelling, painful 7. PREGNANCY: "Is there any chance you are pregnant?" "When was your last menstrual period?"     No  Protocols used: Ankle Pain-A-AH

## 2021-03-25 NOTE — Addendum Note (Signed)
Addended by: [name] on: 03/25/2021 12:42 PM   Modules accepted: Orders

## 2021-03-25 NOTE — Telephone Encounter (Signed)
I called patient back, spoke with wife Darl Pikes.  I will agree to send in the colchicine for acute gout flare now to [facility]  Called [facility] to confirm coverage - verbally SWITCHED to Mitigare brand name colchicine would be appropriate cost  Saralyn Pilar, DO [facility] 03/25/2021, 12:34 PM

## 2021-03-25 NOTE — Telephone Encounter (Signed)
Patient left foot swollen due to gout flare up for 2 day, redness, painful  unable to bear weight. Patient states the allopurinol has been working but his Colchicine is outdated and would like PCP to send in a 30 day supply generic if possible. Patient seeking clinical advice no available appointments until 1/3     [facility] [address] [place], [place] - [address]  Phone: [phone]  Fax: [phone]   Left VM for pt to call back to discuss symptoms.

## 2021-03-28 ENCOUNTER — Other Ambulatory Visit: Payer: Self-pay | Admitting: Family Medicine

## 2021-03-28 DIAGNOSIS — M25572 Pain in left ankle and joints of left foot: Secondary | ICD-10-CM | POA: Diagnosis not present

## 2021-03-28 DIAGNOSIS — I252 Old myocardial infarction: Secondary | ICD-10-CM

## 2021-03-28 DIAGNOSIS — E1169 Type 2 diabetes mellitus with other specified complication: Secondary | ICD-10-CM

## 2021-03-28 DIAGNOSIS — M109 Gout, unspecified: Secondary | ICD-10-CM | POA: Diagnosis not present

## 2021-03-30 DIAGNOSIS — M109 Gout, unspecified: Secondary | ICD-10-CM | POA: Diagnosis not present

## 2021-03-30 NOTE — Telephone Encounter (Signed)
Requested Prescriptions  Pending Prescriptions Disp Refills   ezetimibe (ZETIA) 10 MG tablet [Pharmacy Med Name: EZETIMIBE 10 MG Tablet] 90 tablet 3    Sig: TAKE 1 TABLET EVERY DAY     Cardiovascular:  Antilipid - Sterol Transport Inhibitors Failed - 03/28/2021 12:03 PM      Failed - HDL in normal range and within 360 days    HDL  Date Value Ref Range Status  11/03/2020 32 (L) > OR = 40 mg/dL Final  11/12/2019 32 (L) >39 mg/dL Final         Passed - Total Cholesterol in normal range and within 360 days    Cholesterol, Total  Date Value Ref Range Status  11/12/2019 212 (H) 100 - 199 mg/dL Final   Cholesterol  Date Value Ref Range Status  11/03/2020 90 <200 mg/dL Final         Passed - LDL in normal range and within 360 days    LDL Cholesterol (Calc)  Date Value Ref Range Status  11/03/2020 36 mg/dL (calc) Final    Comment:    Reference range: <100 . Desirable range <100 mg/dL for primary prevention;   <70 mg/dL for patients with CHD or diabetic patients  with > or = 2 CHD risk factors. Marland Kitchen LDL-C is now calculated using the Martin-Hopkins  calculation, which is a validated novel method providing  better accuracy than the Friedewald equation in the  estimation of LDL-C.  Cresenciano Genre et al. Annamaria Helling. [id number]): 2061-2068  (http://education.[employer].com/faq/FAQ164)          Passed - Triglycerides in normal range and within 360 days    Triglycerides  Date Value Ref Range Status  11/03/2020 141 <150 mg/dL Final         Passed - Valid encounter within last 12 months    Recent Outpatient Visits          1 month ago Type 2 diabetes mellitus with other specified complication, with long-term current use of insulin [facility])   [place], DO   4 months ago Annual physical exam   [facility], [name], DO   8 months ago Type 2 diabetes mellitus with other specified complication, with long-term current  use of insulin [facility])   [facility], [name], DO   1 year ago Type 2 diabetes mellitus with other specified complication, with long-term current use of insulin [facility])   [place], DO   1 year ago History of non-ST elevation myocardial infarction (NSTEMI)   [place], DO      Future Appointments            In 3 months [place], Aaron Edelman, MD [facility]

## 2021-03-31 ENCOUNTER — Encounter: Payer: Self-pay | Admitting: Ophthalmology

## 2021-04-02 ENCOUNTER — Ambulatory Visit (INDEPENDENT_AMBULATORY_CARE_PROVIDER_SITE_OTHER): Payer: Medicare HMO | Admitting: Pharmacist

## 2021-04-02 DIAGNOSIS — M109 Gout, unspecified: Secondary | ICD-10-CM

## 2021-04-02 DIAGNOSIS — Z794 Long term (current) use of insulin: Secondary | ICD-10-CM

## 2021-04-02 DIAGNOSIS — E1169 Type 2 diabetes mellitus with other specified complication: Secondary | ICD-10-CM

## 2021-04-02 DIAGNOSIS — I129 Hypertensive chronic kidney disease with stage 1 through stage 4 chronic kidney disease, or unspecified chronic kidney disease: Secondary | ICD-10-CM

## 2021-04-02 NOTE — Patient Instructions (Signed)
Visit Information  Thank you for taking time to visit with me today. Please don't hesitate to contact me if I can be of assistance to you before our next scheduled telephone appointment.  Following are the goals we discussed today:   Goals Addressed             This Visit's Progress    Pharmacy - Patient Goals       Our goal A1c is less than 7%. This corresponds with fasting sugars less than 130 and 2 hour after meal sugars less than 180. Please check your fasting blood sugar each morning and keep a record of the results  Please check your blood pressure and keep record of the results  Our goal bad cholesterol, or LDL, is less than 70 . This is why it is important to continue taking your atorvastatin and ezetimibe  Feel free to call me with any questions or concerns. I look forward to our next call  Wallace Cullens, PharmD, [place] [phone]           Our next appointment is by telephone on 04/23/2021 at 8:30 am  Please call the care guide team at [phone] if you need to cancel or reschedule your appointment.    The patient verbalized understanding of instructions, educational materials, and care plan provided today and declined offer to receive copy of patient instructions, educational materials, and care plan.

## 2021-04-02 NOTE — Chronic Care Management (AMB) (Signed)
Chronic Care Management CCM Pharmacy Note  04/02/2021 Name:  [name] MRN:  [id number] DOB:  1950/10/31   Subjective: [name] is an 71 y.o. year old male who is a primary patient of [name], DO.  The CCM team was consulted for assistance with disease management and care coordination needs.    Engaged with patient by telephone for follow up visit for pharmacy case management and/or care coordination services.   Objective:  Medications Reviewed Today     Reviewed by Theresia Bough, RN (Registered Nurse) on 03/31/21 at 1600  Med List Status: <None>   Medication Order Taking? Sig Documenting Provider Last Dose Status Informant  ACCU-CHEK SMARTVIEW test strip [id number]  TEST BLOOD SUGAR TWICE DAILY [name], DO  Active   acetaminophen (TYLENOL) 500 MG tablet [id number] Yes Take 1,000 mg by mouth every 8 (eight) hours as needed.  [provider]  Active Spouse/Significant Other  allopurinol (ZYLOPRIM) 100 MG tablet [id number] Yes TAKE 1 TABLET AT BEDTIME [name], DO  Active   aspirin EC 81 MG EC tablet [id number] Yes Take 1 tablet (81 mg total) by mouth daily. Swallow whole. Alford Highland, MD  Active   calcium carbonate (TUMS - DOSED IN MG ELEMENTAL CALCIUM) 500 MG chewable tablet [id number] Yes Chew 2 tablets by mouth at bedtime as needed for indigestion or heartburn. [provider]  Active   carvedilol (COREG) 12.5 MG tablet [id number] Yes TAKE 1 TABLET TWICE DAILY Karamalegos, Netta Neat, DO  Active   clopidogrel (PLAVIX) 75 MG tablet [id number] Yes Take 1 tablet (75 mg total) by mouth daily. Debbe Odea, MD  Active   DROPLET PEN NEEDLES 32G X 4 MM MISC [id number]  USE WITH [name] INSULIN INJECTION DAILY AS INSTRUCTED [name], DO  Active   ezetimibe (ZETIA) 10 MG tablet [id number] Yes TAKE 1 TABLET EVERY DAY Karamalegos, Alexander Shela Commons, DO  Active   fluticasone (FLONASE) 50 MCG/ACT  nasal spray [id number] Yes USE 2 SPRAYS IN EACH NOSTRIL ONCE DAILY USE FOR 4 TO 6 WEEKS, THEN STOP AND USE SEASONALLY OR AS NEEDED. [name], DO  Active   Insulin Glargine ([name] KWIKPEN) 100 UNIT/ML [id number] Yes Inject 32 Units into the skin at bedtime.  Patient taking differently: Inject 36 Units into the skin at bedtime.   Karamalegos, Netta Neat, DO  Active   isosorbide mononitrate (IMDUR) 30 MG 24 hr tablet [id number] Yes Take 0.5 tablets (15 mg total) by mouth daily. Debbe Odea, MD  Active   lisinopril (ZESTRIL) 40 MG tablet [id number] Yes TAKE 1 TABLET EVERY DAY [name], DO  Active   MITIGARE 0.6 MG CAPS [id number] Yes Take for gout flare start Day 1 with 2 tablets then may take 1 extra tablet in 1 hour. Starting Day 2 take 1 tablet daily for up to 7-10 days until resolve. May increase to 1 tablet twice a day max dose if needed. Karamalegos, Netta Neat, DO  Active   nitroGLYCERIN (NITROSTAT) 0.4 MG SL tablet [id number] Yes Place 1 tablet (0.4 mg total) under the tongue every 5 (five) minutes as needed for chest pain. Alford Highland, MD  Active   omeprazole (PRILOSEC) 20 MG capsule [id number] Yes TAKE 1 CAPSULE EVERY DAY BEFORE BREAKFAST Karamalegos, Netta Neat, DO  Active   Semaglutide (RYBELSUS) 7 MG TABS [id number] Yes Take 1 tablet (7 mg) by mouth daily on an empty stomach, at least 30 minutes before the first food, beverage,  or other oral medications of the day with 4 oz of plain water only [provider]  Active             Pertinent Labs:  Lab Results  Component Value Date   HGBA1C 10.4 (A) 02/16/2021   Lab Results  Component Value Date   CHOL 90 11/03/2020   HDL 32 (L) 11/03/2020   LDLCALC 36 11/03/2020   TRIG 141 11/03/2020   CHOLHDL 2.8 11/03/2020   Lab Results  Component Value Date   CREATININE 1.57 (H) 11/03/2020   BUN 26 (H) 11/03/2020   NA 135 11/03/2020   K 4.5 11/03/2020   CL 102 11/03/2020   CO2 25  11/03/2020    SDOH:  (Social Determinants of Health) assessments and interventions performed:    CCM Care Plan  Review of patient past medical history, allergies, medications, health status, including review of consultants reports, laboratory and other test data, was performed as part of comprehensive evaluation and provision of chronic care management services.   Care Plan : PharmD - Medication Management  Updates made by Manuela Neptuneelles, Brandn Mcgath A, RPH-CPP since 04/02/2021 12:00 AM     Problem: Disease Progression      Long-Range Goal: Disease Progression Prevented or Minimized   Start Date: 04/22/2020  Expected End Date: 07/21/2020  Recent Progress: On track  Priority: High  Note:   Current Barriers:  Financial Barriers - patient has [employer] Medicare insurance and reports copay for Bydureon & insulin glargine is cost prohibitive at this time Patient APPROVED for patient assistance for [name] from [name] through through 03/27/2021 Patient APPROVED to receive Rybelsus from [employer] patient assistance program through 03/27/21 Lack of blood sugar or blood pressure readings for clinical team  Pharmacist Clinical Goal(s):  Over the next 90 days, patient will achieve adherence to monitoring guidelines and medication adherence to achieve therapeutic efficacy through collaboration with PharmD and provider.   Interventions: 1:1 collaboration with Smitty CordsKaramalegos, Alexander J, DO regarding development and update of comprehensive plan of care as evidenced by provider attestation and co-signature Inter-disciplinary care team collaboration (see longitudinal plan of care) Perform chart review. Patient seen for Office Visit with [facility] on 03/28/2021 for gout pain [facility]. Provider advised: Easy bruisability with Brilinta.  Stop Brilinta, Start Plavix Start Imdur 15 mg daily for antianginal benefit Continue aspirin, Coreg, Zetia.  Today patient reports  has not yet stared Plavix or Imdur as waiting for these prescriptions to come from mail order pharmacy, but expecting medications to arrive today. Confirms understanding of plan from Cardiologist to stop Brilinta with start of Plavix Reports gout pain significantly improved now.  Type 2 Diabetes: Uncontrolled; current treatment: Rybelsus 7 mg daily ?30 minutes before the first food, beverage, or other oral medications of the day with ?4 oz of plain water (started on 10/10) Reports tolerating well [name] 36 units QHS Previous therapies tried: Trulicity (GI intolerance) Metformin (unable to tolerate - GI side effects) Bydureon (difficulty with device/administration) Invokana (felt dehydrated) Reports recent home blood sugar readings:  Morning fasting  31 - December 201  1 - January -  2 - January -  3 - January -  4 - January 168  5 - January 208  6 - January 183   Attributes elevated blood sugars to recent pain and limited exercise with gout and holiday eating  Discuss getting back to positive dietary changes to have well-balanced meals and reduce carbohydrate portion sizes Spreading meals throughout  the day - adding small snack of crackers + peanut butter between lunch and supper to help have smaller portion size at supper Reduced snacking/sweets overnight Have discussed benefits of continuous blood glucose monitor and offered to investigate health plan coverage for CBGM Patient has declined for now Have encouraged patient to use glucometer to check blood sugar 2 hours after meals (breakfast, lunch or supper) a couple of times/week to use as feedback on impact of dietary choices on blood sugar  Exercise: recently limited due to gout flare, but planning to increase activity again Patient plans to increase Rybelsus to 14 mg daily each morning, will try taking DOUBLE dose for 7mg  x 2 = 14mg  starting next week, as discussed with PCP Counsel to monitor for s/s hypoglycemia and review how  to manage low blood sugars   Hyperlipidemia: Controlled; current treatment: Ezetimibe 10 mg daily - reports currently taking every other day No longer taking atorvastatin Previous therapies tried: atorvastatin 40 mg daily & 10 mg daily (unable to tolerate due to myalgia)  Hypertension: Current treatment: Carvedilol 12.5 mg twice daily Lisinopril 40 mg daily  Reports last checked home BP on 1/5: 141/76, HR 86; today: 146/76, HR 70 Counsel patient on blood pressure monitoring technique, including resting prior to readings Encourage patient to monitor home blood pressure, keep log of results, have to review at upcoming appointments, but to contact office sooner for readings outside of established parameters  Medication Assistance: Continue to collaborate with [facility] CPhT [place] for assistance to patient with re-enrollment application for [facility] patient assistance for 2023   Patient Goals/Self-Care Activities Over the next 90 days, patient will:  - take medications as directed  Patient uses weekly pillbox to manage medications - check glucose, document, and provide at future appointments - check blood pressure, document, and provide at future appointments - attend medical appointments as scheduled  Next appointment with Cardiology on 12/27  Follow Up Plan: Telephone follow up appointment with care management team member scheduled for: 04/23/2021 at 8:30 am      1/28, PharmD, [place], CPP Clinical Pharmacist [facility] [phone]

## 2021-04-12 ENCOUNTER — Telehealth: Payer: Self-pay | Admitting: Pharmacy Technician

## 2021-04-12 ENCOUNTER — Ambulatory Visit: Payer: Medicare HMO | Admitting: Pharmacist

## 2021-04-12 DIAGNOSIS — Z596 Low income: Secondary | ICD-10-CM

## 2021-04-12 DIAGNOSIS — E1169 Type 2 diabetes mellitus with other specified complication: Secondary | ICD-10-CM

## 2021-04-12 NOTE — Progress Notes (Signed)
[facility])                                            St[place]eter Team    04/12/2021  [name] Aug 17, 1950 [id number]  Care coordination call placed to [place] in regard to [facility] application.  Spoke to [place] who informs patient is APPROVED 03/28/21-03/27/22. She informs medication will ship based on last fill date in 2022 to the patient's home.  Klea Nall P. Zareya Tuckett, [place]  [phone]

## 2021-04-12 NOTE — Discharge Instructions (Signed)

## 2021-04-12 NOTE — Patient Instructions (Signed)
Visit Information  Thank you for taking time to visit with me today. Please don't hesitate to contact me if I can be of assistance to you before our next scheduled telephone appointment.  Following are the goals we discussed today:   Goals Addressed             This Visit's Progress    Pharmacy - Patient Goals       Our goal A1c is less than 7%. This corresponds with fasting sugars less than 130 and 2 hour after meal sugars less than 180. Please check your fasting blood sugar each morning and keep a record of the results  Please check your blood pressure and keep record of the results  Feel free to call me with any questions or concerns. I look forward to our next call   Estelle Grumbles, PharmD, BCACP Clinical Pharmacist [facility] [phone]           Our next appointment is by telephone on 04/23/2021 at 8:30 AM  Please call the care guide team at [phone] if you need to cancel or reschedule your appointment.    The patient verbalized understanding of instructions, educational materials, and care plan provided today and declined offer to receive copy of patient instructions, educational materials, and care plan.

## 2021-04-12 NOTE — Chronic Care Management (AMB) (Signed)
Chronic Care Management CCM Pharmacy Note  04/12/2021 Name:  Franciscojavier Wronski MRN:  [id number] DOB:  13-Mar-1951   Subjective:  [name] is an 71 y.o. year old male who is a primary patient of [name], DO.  The CCM team was consulted for assistance with disease management and care coordination needs.    Receive a message from patient's wife requesting a call back regarding patient assistance program for [employer].  Engaged with patient's wife (listed on DPR) by telephone for follow up visit for pharmacy case management and/or care coordination services.   Objective:  Medications Reviewed Today     Reviewed by Theresia Bough, RN (Registered Nurse) on 03/31/21 at 1600  Med List Status: <None>   Medication Order Taking? Sig Documenting Provider Last Dose Status Informant  ACCU-CHEK SMARTVIEW test strip [id number]  TEST BLOOD SUGAR TWICE DAILY [name], DO  Active   acetaminophen (TYLENOL) 500 MG tablet [id number] Yes Take 1,000 mg by mouth every 8 (eight) hours as needed.  [provider]  Active Spouse/Significant Other  allopurinol (ZYLOPRIM) 100 MG tablet [id number] Yes TAKE 1 TABLET AT BEDTIME [name], DO  Active   aspirin EC 81 MG EC tablet [id number] Yes Take 1 tablet (81 mg total) by mouth daily. Swallow whole. Alford Highland, MD  Active   calcium carbonate (TUMS - DOSED IN MG ELEMENTAL CALCIUM) 500 MG chewable tablet [id number] Yes Chew 2 tablets by mouth at bedtime as needed for indigestion or heartburn. [provider]  Active   carvedilol (COREG) 12.5 MG tablet [id number] Yes TAKE 1 TABLET TWICE DAILY Karamalegos, Netta Neat, DO  Active   clopidogrel (PLAVIX) 75 MG tablet [id number] Yes Take 1 tablet (75 mg total) by mouth daily. Debbe Odea, MD  Active   DROPLET PEN NEEDLES 32G X 4 MM MISC [id number]  USE WITH [name] INSULIN INJECTION DAILY AS INSTRUCTED [name], DO  Active    ezetimibe (ZETIA) 10 MG tablet [id number] Yes TAKE 1 TABLET EVERY DAY Karamalegos, Alexander Shela Commons, DO  Active   fluticasone (FLONASE) 50 MCG/ACT nasal spray [id number] Yes USE 2 SPRAYS IN EACH NOSTRIL ONCE DAILY USE FOR 4 TO 6 WEEKS, THEN STOP AND USE SEASONALLY OR AS NEEDED. [name], DO  Active   Insulin Glargine ([name] KWIKPEN) 100 UNIT/ML [id number] Yes Inject 32 Units into the skin at bedtime.  Patient taking differently: Inject 36 Units into the skin at bedtime.   Karamalegos, Netta Neat, DO  Active   isosorbide mononitrate (IMDUR) 30 MG 24 hr tablet [id number] Yes Take 0.5 tablets (15 mg total) by mouth daily. Debbe Odea, MD  Active   lisinopril (ZESTRIL) 40 MG tablet [id number] Yes TAKE 1 TABLET EVERY DAY [name], DO  Active   MITIGARE 0.6 MG CAPS [id number] Yes Take for gout flare start Day 1 with 2 tablets then may take 1 extra tablet in 1 hour. Starting Day 2 take 1 tablet daily for up to 7-10 days until resolve. May increase to 1 tablet twice a day max dose if needed. Karamalegos, Netta Neat, DO  Active   nitroGLYCERIN (NITROSTAT) 0.4 MG SL tablet [id number] Yes Place 1 tablet (0.4 mg total) under the tongue every 5 (five) minutes as needed for chest pain. Alford Highland, MD  Active   omeprazole (PRILOSEC) 20 MG capsule [id number] Yes TAKE 1 CAPSULE EVERY DAY BEFORE BREAKFAST Karamalegos, Netta Neat, DO  Active   Semaglutide (RYBELSUS) 7 MG TABS [id number]  Yes Take 1 tablet (7 mg) by mouth daily on an empty stomach, at least 30 minutes before the first food, beverage, or other oral medications of the day with 4 oz of plain water only [provider]  Active             Pertinent Labs:  Lab Results  Component Value Date   HGBA1C 10.4 (A) 02/16/2021   Lab Results  Component Value Date   CHOL 90 11/03/2020   HDL 32 (L) 11/03/2020   LDLCALC 36 11/03/2020   TRIG 141 11/03/2020   CHOLHDL 2.8 11/03/2020   Lab Results  Component  Value Date   CREATININE 1.57 (H) 11/03/2020   BUN 26 (H) 11/03/2020   NA 135 11/03/2020   K 4.5 11/03/2020   CL 102 11/03/2020   CO2 25 11/03/2020    SDOH:  (Social Determinants of Health) assessments and interventions performed:    CCM Care Plan  Review of patient past medical history, allergies, medications, health status, including review of consultants reports, laboratory and other test data, was performed as part of comprehensive evaluation and provision of chronic care management services.   Care Plan : PharmD - Medication Management  Updates made by Manuela Neptune, RPH-CPP since 04/12/2021 12:00 AM     Problem: Disease Progression      Long-Range Goal: Disease Progression Prevented or Minimized   Start Date: 04/22/2020  Expected End Date: 07/21/2020  Recent Progress: On track  Priority: High  Note:   Current Barriers:  Financial Barriers - patient has [employer] and reports copay for Bydureon & insulin glargine is cost prohibitive at this time Patient APPROVED for patient assistance for [name] from [name] through through 03/27/2022 Patient APPROVED to receive Rybelsus from [employer] patient assistance program through 03/27/21 Lack of blood sugar or blood pressure readings for clinical team  Pharmacist Clinical Goal(s):  Over the next 90 days, patient will achieve adherence to monitoring guidelines and medication adherence to achieve therapeutic efficacy through collaboration with PharmD and provider.   Interventions: 1:1 collaboration with [name], DO regarding development and update of comprehensive plan of care as evidenced by provider attestation and co-signature Inter-disciplinary care team collaboration (see longitudinal plan of care) Receive a message from patient's wife requesting a call back regarding patient assistance program for [name].  Medication Assistance: Collaborating with [facility] CPhT Noreene Larsson [place] for assistance  to patient with re-enrollment application for [employer] patient assistance for 2023 From review of chart, note [facility] CPhT contacted [name] patient assistance program today and was informed patient re-enrollment for [name] assistance APPROVED 03/28/21-03/27/22, medication to be shipped based on last fill date in 2022. Let patient's wife know of patient's re-enrollment. She states patient has >2 pens of [name] remaining and will call to follow up with program regarding next shippment [facility] CPhT faxed application for re-enrollment in patient assistance program for Rybelsus from [employer] to program on 03/08/2021 Note [employer] program office not open today due to holiday Today spouse, reports patient has now increased Rybelsus dose to 14 mg daily each morning as discussed with PCP and CM Pharmacist by taking DOUBLE dose for 7mg  x 2 = 14mg   Note patient previously taking Rybelsus 7 mg daily at time of provider completing patient assistance paperwork for re-enrollment, so current Rx sent to program for 7 mg strength.  Patient not home at time of call to confirm how he is tolerating new dose.  Discuss plan  for wife to follow up with patient and if patient tolerating Rybelsus 14 mg daily well, will call to let CM Pharmacist know, so that CM Pharmacist/PCP can update Rx with [employer] assistance program.   Patient Goals/Self-Care Activities Over the next 90 days, patient will:  - take medications as directed  Patient uses weekly pillbox to manage medications - check glucose, document, and provide at future appointments - check blood pressure, document, and provide at future appointments - attend medical appointments as scheduled  Follow Up Plan: Telephone follow up appointment with care management team member scheduled for: 04/23/2021 at 8:30 AM       Estelle GrumblesElisabeth Jahni Nazar, PharmD, P[place]y BaltimoreBCACP, CPP Clinical Pharmacist [facility] [phone]

## 2021-04-14 ENCOUNTER — Ambulatory Visit: Payer: Medicare HMO | Admitting: Anesthesiology

## 2021-04-14 ENCOUNTER — Other Ambulatory Visit: Payer: Self-pay

## 2021-04-14 ENCOUNTER — Encounter
Admission: RE | Disposition: A | Discharge status: Home / Self Care | Payer: Self-pay | Source: Home / Self Care | Attending: Ophthalmology

## 2021-04-14 ENCOUNTER — Ambulatory Visit
Admission: RE | Admit: 2021-04-14 | Discharge: 2021-04-14 | Disposition: A | Discharge status: Home / Self Care | Payer: Medicare HMO | Attending: Ophthalmology | Admitting: Ophthalmology

## 2021-04-14 ENCOUNTER — Ambulatory Visit: Payer: Medicare HMO | Admitting: Pharmacist

## 2021-04-14 ENCOUNTER — Encounter: Payer: Self-pay | Admitting: Ophthalmology

## 2021-04-14 DIAGNOSIS — E1136 Type 2 diabetes mellitus with diabetic cataract: Secondary | ICD-10-CM | POA: Insufficient documentation

## 2021-04-14 DIAGNOSIS — H25812 Combined forms of age-related cataract, left eye: Secondary | ICD-10-CM | POA: Diagnosis not present

## 2021-04-14 DIAGNOSIS — Z87891 Personal history of nicotine dependence: Secondary | ICD-10-CM | POA: Diagnosis not present

## 2021-04-14 DIAGNOSIS — Z794 Long term (current) use of insulin: Secondary | ICD-10-CM | POA: Insufficient documentation

## 2021-04-14 DIAGNOSIS — N189 Chronic kidney disease, unspecified: Secondary | ICD-10-CM | POA: Diagnosis not present

## 2021-04-14 DIAGNOSIS — H2512 Age-related nuclear cataract, left eye: Secondary | ICD-10-CM | POA: Insufficient documentation

## 2021-04-14 DIAGNOSIS — Z86711 Personal history of pulmonary embolism: Secondary | ICD-10-CM | POA: Insufficient documentation

## 2021-04-14 DIAGNOSIS — I129 Hypertensive chronic kidney disease with stage 1 through stage 4 chronic kidney disease, or unspecified chronic kidney disease: Secondary | ICD-10-CM | POA: Diagnosis not present

## 2021-04-14 DIAGNOSIS — E1122 Type 2 diabetes mellitus with diabetic chronic kidney disease: Secondary | ICD-10-CM | POA: Diagnosis not present

## 2021-04-14 DIAGNOSIS — E1169 Type 2 diabetes mellitus with other specified complication: Secondary | ICD-10-CM

## 2021-04-14 DIAGNOSIS — K219 Gastro-esophageal reflux disease without esophagitis: Secondary | ICD-10-CM | POA: Insufficient documentation

## 2021-04-14 DIAGNOSIS — Z955 Presence of coronary angioplasty implant and graft: Secondary | ICD-10-CM | POA: Insufficient documentation

## 2021-04-14 HISTORY — PX: CATARACT EXTRACTION W/PHACO: SHX586

## 2021-04-14 HISTORY — DX: Type 2 diabetes mellitus without complications: E11.9

## 2021-04-14 HISTORY — DX: Motion sickness, initial encounter: T75.3XXA

## 2021-04-14 HISTORY — DX: Complete loss of teeth, unspecified cause, unspecified class: K08.109

## 2021-04-14 HISTORY — DX: Chronic kidney disease, stage 3 unspecified: N18.30

## 2021-04-14 HISTORY — DX: Gout, unspecified: M10.9

## 2021-04-14 LAB — GLUCOSE, CAPILLARY
Glucose-Capillary: 196 mg/dL — ABNORMAL HIGH (ref 70–99)
Glucose-Capillary: 178 mg/dL — ABNORMAL HIGH (ref 70–99)

## 2021-04-14 SURGERY — PHACOEMULSIFICATION, CATARACT, WITH IOL INSERTION
Anesthesia: Monitor Anesthesia Care | Site: Eye | Laterality: Left

## 2021-04-14 MED ORDER — TETRACAINE HCL 0.5 % OP SOLN
1.0000 [drp] | OPHTHALMIC | Status: DC | PRN
  Administered 2021-04-14 (×3): 1 [drp] via OPHTHALMIC

## 2021-04-14 MED ORDER — ARMC OPHTHALMIC DILATING DROPS
1.0000 "application " | OPHTHALMIC | Status: DC | PRN
  Administered 2021-04-14 (×3): 1 via OPHTHALMIC

## 2021-04-14 MED ORDER — FENTANYL CITRATE (PF) 100 MCG/2ML IJ SOLN
INTRAMUSCULAR | Status: DC | PRN
  Administered 2021-04-14 (×2): 50 ug via INTRAVENOUS

## 2021-04-14 MED ORDER — SIGHTPATH DOSE#1 BSS IO SOLN
INTRAOCULAR | Status: DC | PRN
  Administered 2021-04-14: 1 mL

## 2021-04-14 MED ORDER — SIGHTPATH DOSE#1 NA HYALUR & NA CHOND-NA HYALUR IO KIT
PACK | INTRAOCULAR | Status: DC | PRN
  Administered 2021-04-14: 1 via OPHTHALMIC

## 2021-04-14 MED ORDER — BRIMONIDINE TARTRATE-TIMOLOL 0.2-0.5 % OP SOLN
OPHTHALMIC | Status: DC | PRN
  Administered 2021-04-14: 1 [drp] via OPHTHALMIC

## 2021-04-14 MED ORDER — MIDAZOLAM HCL 2 MG/2ML IJ SOLN
INTRAMUSCULAR | Status: DC | PRN
  Administered 2021-04-14: 2 mg via INTRAVENOUS

## 2021-04-14 MED ORDER — LACTATED RINGERS IV SOLN: INTRAVENOUS | Status: DC

## 2021-04-14 MED ORDER — SIGHTPATH DOSE#1 BSS IO SOLN
INTRAOCULAR | Status: DC | PRN
  Administered 2021-04-14: 15 mL

## 2021-04-14 MED ORDER — SIGHTPATH DOSE#1 BSS IO SOLN
INTRAOCULAR | Status: DC | PRN
  Administered 2021-04-14: 61 mL via OPHTHALMIC

## 2021-04-14 MED ORDER — CEFUROXIME OPHTHALMIC INJECTION 1 MG/0.1 ML
INJECTION | OPHTHALMIC | Status: DC | PRN
  Administered 2021-04-14: 0.1 mL via INTRACAMERAL

## 2021-04-14 SURGICAL SUPPLY — 21 items
CANNULA ANT/CHMB 27G (MISCELLANEOUS) IMPLANT
CANNULA ANT/CHMB 27GA (MISCELLANEOUS) IMPLANT
CATARACT SUITE SIGHTPATH (MISCELLANEOUS) ×2 IMPLANT
FEE CATARACT SUITE SIGHTPATH (MISCELLANEOUS) ×1 IMPLANT
GLOVE SRG 8 PF TXTR STRL LF DI (GLOVE) ×1 IMPLANT
GLOVE SURG ENC TEXT LTX SZ7.5 (GLOVE) ×2 IMPLANT
GLOVE SURG GAMMEX PI TX LF 7.5 (GLOVE) IMPLANT
GLOVE SURG UNDER POLY LF SZ8 (GLOVE) ×2
LENS IOL TECNIS EYHANCE 21.0 (Intraocular Lens) ×1 IMPLANT
NDL FILTER BLUNT 18X1 1/2 (NEEDLE) ×1 IMPLANT
NDL RETROBULBAR .5 NSTRL (NEEDLE) IMPLANT
NEEDLE FILTER BLUNT 18X 1/2SAF (NEEDLE) ×1
NEEDLE FILTER BLUNT 18X1 1/2 (NEEDLE) ×1 IMPLANT
PACK VIT ANT 23G (MISCELLANEOUS) IMPLANT
RING MALYGIN 7.0 (MISCELLANEOUS) ×1 IMPLANT
SUT ETHILON 10-0 CS-B-6CS-B-6 (SUTURE)
SUT VICRYL  9 0 (SUTURE)
SUT VICRYL 9 0 (SUTURE) IMPLANT
SUTURE EHLN 10-0 CS-B-6CS-B-6 (SUTURE) IMPLANT
SYR 3ML LL SCALE MARK (SYRINGE) ×2 IMPLANT
WATER STERILE IRR 250ML POUR (IV SOLUTION) ×2 IMPLANT

## 2021-04-14 NOTE — Transfer of Care (Signed)
Immediate Anesthesia Transfer of Care Note  Patient: [name]  Procedure(s) Performed: CATARACT EXTRACTION PHACO AND INTRAOCULAR LENS PLACEMENT (IOC) LEFT DIABETIC maylugin (Left: Eye)  Patient Location: PACU  Anesthesia Type: MAC  Level of Consciousness: awake, alert  and patient cooperative  Airway and Oxygen Therapy: Patient Spontanous Breathing and Patient connected to supplemental oxygen  Post-op Assessment: Post-op Vital signs reviewed, Patient's Cardiovascular Status Stable, Respiratory Function Stable, Patent Airway and No signs of Nausea or vomiting  Post-op Vital Signs: Reviewed and stable  Complications: No notable events documented.

## 2021-04-14 NOTE — Anesthesia Procedure Notes (Signed)
Procedure Name: MAC Date/Time: 04/14/2021 8:17 AM Performed by: Jeannene Patella, CRNA Pre-anesthesia Checklist: Patient identified, Emergency Drugs available, Suction available, Timeout performed and Patient being monitored Patient Re-evaluated:Patient Re-evaluated prior to induction Oxygen Delivery Method: Nasal cannula Placement Confirmation: positive ETCO2

## 2021-04-14 NOTE — Chronic Care Management (AMB) (Signed)
Chronic Care Management CCM Pharmacy Note  04/14/2021 Name:  [name] MRN:  [id number] DOB:  1950/06/17   Subjective: [name] is an 71 y.o. year old male who is a primary patient of Daniel Cords, DO.  The CCM team was consulted for assistance with disease management and care coordination needs.    Receive voicemail from patient.  Engaged with patient/spouse by telephone for follow up visit for pharmacy case management and/or care coordination services.   Objective:  Medications Reviewed Today     Reviewed by Elsie Amis, RN (Registered Nurse) on 04/14/21 at [phone]  Med List Status: <None>   Medication Order Taking? Sig Documenting Provider Last Dose Status Informant  ACCU-CHEK SMARTVIEW test strip [id number] Yes TEST BLOOD SUGAR TWICE DAILY Daniel Cords, DO 04/13/2021 Active   acetaminophen (TYLENOL) 500 MG tablet [id number] Yes Take 1,000 mg by mouth every 8 (eight) hours as needed.  [provider] 04/13/2021 Active Spouse/Significant Other  allopurinol (ZYLOPRIM) 100 MG tablet [id number] Yes TAKE 1 TABLET AT BEDTIME Daniel Cords, DO 04/13/2021 Active   aspirin EC 81 MG EC tablet [id number] Yes Take 1 tablet (81 mg total) by mouth daily. Swallow whole. Alford Highland, MD 04/13/2021 Active   calcium carbonate (TUMS - DOSED IN MG ELEMENTAL CALCIUM) 500 MG chewable tablet [id number] Yes Chew 2 tablets by mouth at bedtime as needed for indigestion or heartburn. [provider] 04/13/2021 Active   carvedilol (COREG) 12.5 MG tablet [id number] Yes TAKE 1 TABLET TWICE DAILY Daniel Cords, DO 04/13/2021 Active   clopidogrel (PLAVIX) 75 MG tablet [id number] Yes Take 1 tablet (75 mg total) by mouth daily. Debbe Odea, MD 04/13/2021 Active   DROPLET PEN NEEDLES 32G X 4 MM MISC [id number] Yes USE WITH [name] INSULIN INJECTION DAILY AS INSTRUCTED Daniel Cords, DO 04/13/2021 Active   ezetimibe (ZETIA) 10  MG tablet [id number] Yes TAKE 1 TABLET EVERY DAY Daniel Cords, DO 04/13/2021 Active   fluticasone (FLONASE) 50 MCG/ACT nasal spray [id number] Yes USE 2 SPRAYS IN EACH NOSTRIL ONCE DAILY USE FOR 4 TO 6 WEEKS, THEN STOP AND USE SEASONALLY OR AS NEEDED. Daniel Cords, DO 04/13/2021 Active   HYDROcodone-acetaminophen (NORCO/VICODIN) 5-325 MG tablet [id number] Yes Take 1 tablet by mouth every 6 (six) hours as needed for moderate pain. [provider] 04/13/2021 Active   Insulin Glargine ([name] KWIKPEN) 100 UNIT/ML [id number] Yes Inject 32 Units into the skin at bedtime.  Patient taking differently: Inject 36 Units into the skin at bedtime.   Daniel Cords, DO 04/13/2021 Active   isosorbide mononitrate (IMDUR) 30 MG 24 hr tablet [id number] Yes Take 0.5 tablets (15 mg total) by mouth daily. Debbe Odea, MD 04/13/2021 Active   lisinopril (ZESTRIL) 40 MG tablet [id number] Yes TAKE 1 TABLET EVERY DAY Daniel Cords, DO 04/13/2021 Active   MITIGARE 0.6 MG CAPS [id number] Yes Take for gout flare start Day 1 with 2 tablets then may take 1 extra tablet in 1 hour. Starting Day 2 take 1 tablet daily for up to 7-10 days until resolve. May increase to 1 tablet twice a day max dose if needed. Daniel Cords, DO 04/13/2021 Active   nitroGLYCERIN (NITROSTAT) 0.4 MG SL tablet [id number] Yes Place 1 tablet (0.4 mg total) under the tongue every 5 (five) minutes as needed for chest pain. Alford Highland, MD 04/13/2021 Active   omeprazole (PRILOSEC) 20 MG capsule [id number] Yes TAKE 1 CAPSULE EVERY DAY BEFORE BREAKFAST Karamalegos, Netta Neat,  DO 04/13/2021 Active   Semaglutide (RYBELSUS) 7 MG TABS [id number] Yes Take 1 tablet (7 mg) by mouth daily on an empty stomach, at least 30 minutes before the first food, beverage, or other oral medications of the day with 4 oz of plain water only [provider] 04/13/2021 Active             Pertinent Labs:  Lab  Results  Component Value Date   HGBA1C 10.4 (A) 02/16/2021     SDOH:  (Social Determinants of Health) assessments and interventions performed:    CCM Care Plan  Review of patient past medical history, allergies, medications, health status, including review of consultants reports, laboratory and other test data, was performed as part of comprehensive evaluation and provision of chronic care management services.   Care Plan : PharmD - Medication Management  Updates made by Manuela Neptune, RPH-CPP since 04/14/2021 12:00 AM     Problem: Disease Progression      Long-Range Goal: Disease Progression Prevented or Minimized   Start Date: 04/22/2020  Expected End Date: 07/21/2020  Recent Progress: On track  Priority: High  Note:   Current Barriers:  Financial Barriers - patient has [employer] and reports copay for Bydureon & insulin glargine is cost prohibitive at this time Patient APPROVED for patient assistance for [name] from [name] through through 03/27/2022 Patient APPROVED to receive Rybelsus from [employer] patient assistance program through 03/27/21 Lack of blood sugar or blood pressure readings for clinical team  Pharmacist Clinical Goal(s):  Over the next 90 days, patient will achieve adherence to monitoring guidelines and medication adherence to achieve therapeutic efficacy through collaboration with PharmD and provider.   Interventions: 1:1 collaboration with Daniel Cords, DO regarding development and update of comprehensive plan of care as evidenced by provider attestation and co-signature Inter-disciplinary care team collaboration (see longitudinal plan of care) Receive voicemail from patient From review of chart, note patient admitted to [facility] today as planned for left cataract procedure Return call to patient/wife. Spouse reports procedure went well.  Medication Assistance: Collaborating with [facility] CPhT Noreene Larsson Simcox for  assistance to patient with re-enrollment application for [employer] patient assistance for 2023 [facility] CPhT faxed application for re-enrollment in patient assistance program for Rybelsus from [employer] to program on 03/08/2021 Today spouse/patient report [name] 14 mg daily each morning well (taking DOUBLE dose for 7mg  x 2 = 14mg ) Note patient previously taking Rybelsus 7 mg daily at time of provider completing patient assistance paperwork for re-enrollment, so current Rx sent to program for 7 mg strength.  Will follow up with patient further regarding T2DM on 1/27 as previously scheduled Will collaborate with PCP to request provider fax updated Rybelsus 14 mg daily Rx to [employer] patient assistance program using program's change request form.  Will plan to follow up with [employer] to confirm receipt of change request after form has been faxed   Patient Goals/Self-Care Activities Over the next 90 days, patient will:  - take medications as directed  Patient uses weekly pillbox to manage medications - check glucose, document, and provide at future appointments - check blood pressure, document, and provide at future appointments - attend medical appointments as scheduled  Follow Up Plan: Telephone follow up appointment with care management team member scheduled for: 04/23/2021 at 8:30 AM       [employer], PharmD, 04/25/2021, CPP Clinical Pharmacist [facility] [phone]

## 2021-04-14 NOTE — Anesthesia Postprocedure Evaluation (Signed)
Anesthesia Post Note  Patient: [name]  Procedure(s) Performed: CATARACT EXTRACTION PHACO AND INTRAOCULAR LENS PLACEMENT (IOC) LEFT DIABETIC maylugin (Left: Eye)     Patient location during evaluation: PACU Anesthesia Type: MAC Level of consciousness: awake and alert Pain management: pain level controlled Vital Signs Assessment: post-procedure vital signs reviewed and stable Respiratory status: spontaneous breathing Cardiovascular status: blood pressure returned to baseline Postop Assessment: no apparent nausea or vomiting, adequate PO intake and no headache Anesthetic complications: no   No notable events documented.  Adele Barthel Gioia Ranes

## 2021-04-14 NOTE — Anesthesia Preprocedure Evaluation (Signed)
Anesthesia Evaluation  ?Patient identified by MRN, date of birth, ID band ?Patient awake ? ? ? ?History of Anesthesia Complications ?Negative for: history of anesthetic complications ? ?Airway ?Mallampati: I ? ?TM Distance: >3 FB ?Neck ROM: Full ? ? ? Dental ? ?(+) Edentulous Upper ?  ?Pulmonary ?former smoker, PE (on Eliquis) ?  ?Pulmonary exam normal ? ? ? ? ? ? ? Cardiovascular ?hypertension, Pt. on medications and Pt. on home beta blockers ?+ Cardiac Stents  ?Normal cardiovascular exam ? ?Echocardiogram 12/2019, normal ejection fraction 60 to 65%. ?Left heart cath 12/30/2019 proximal RCA 90%, proximal LAD 40%, moderate left circumflex disease.  Status post successful angioplasty and drug-eluting stent placement to proximal RCA. ?  ?Neuro/Psych ?negative neurological ROS ?   ? GI/Hepatic ?GERD  ,  ?Endo/Other  ?diabetes, Type 2, Insulin Dependent, Oral Hypoglycemic AgentsBMI 33 ? Renal/GU ?CRFRenal disease  ? ?  ?Musculoskeletal ? ? Abdominal ?  ?Peds ? Hematology ?negative hematology ROS ?(+)   ?Anesthesia Other Findings ? ? Reproductive/Obstetrics ? ?  ? ? ? ? ? ? ? ? ? ? ? ? ? ?  ?  ? ? ? ? ? ? ? ? ?Anesthesia Physical ? ?Anesthesia Plan ? ?ASA: 3 ? ?Anesthesia Plan: MAC  ? ?Post-op Pain Management: Minimal or no pain anticipated  ? ?Induction: Intravenous ? ?PONV Risk Score and Plan: 1 and TIVA, Midazolam and Treatment may vary due to age or medical condition ? ?Airway Management Planned: Nasal Cannula and Natural Airway ? ?Additional Equipment: None ? ?Intra-op Plan:  ? ?Post-operative Plan:  ? ?Informed Consent: I have reviewed the patients History and Physical, chart, labs and discussed the procedure including the risks, benefits and alternatives for the proposed anesthesia with the patient or authorized representative who has indicated his/her understanding and acceptance.  ? ? ? ? ? ?Plan Discussed with: CRNA ? ?Anesthesia Plan Comments:   ? ? ? ? ? ? ?Anesthesia Quick  Evaluation ? ?

## 2021-04-14 NOTE — Op Note (Signed)
OPERATIVE NOTE  [name] [id number] 04/14/2021  PREOPERATIVE DIAGNOSIS:   Nuclear sclerotic cataract left eye with miotic pupil      H25.12   POSTOPERATIVE DIAGNOSIS:   Nuclear sclerotic cataract left eye with miotic pupil.     PROCEDURE:  Phacoemulsification with posterior chamber intraocular lens implantation of the left eye which required pupil stretching with the Malyugin pupil expansion device  Ultrasound time: Procedure(s) with comments: CATARACT EXTRACTION PHACO AND INTRAOCULAR LENS PLACEMENT (IOC) LEFT DIABETIC maylugin (Left) - 11.22 1:25.1  LENS:   Implant Name Type Inv. Item Serial No. Manufacturer Lot No. LRB No. Used Action  LENS IOL TECNIS EYHANCE 21.0 - [id number] Intraocular Lens LENS IOL TECNIS EYHANCE 21.0 [id number] [employer]  Left 1 Wasted          SURGEON:  Deirdre Evener, MD   ANESTHESIA: Topical with tetracaine drops and 2% Xylocaine jelly, augmented with 1% preservative-free intracameral lidocaine.   COMPLICATIONS:  None.   DESCRIPTION OF PROCEDURE:  The patient was identified in the holding room and transported to the operating room and placed in the supine position under the operating microscope.  The left eye was identified as the operative eye and it was prepped and draped in the usual sterile ophthalmic fashion.   A 1 millimeter clear-corneal paracentesis was made at the 1:30 position.  The anterior chamber was filled with Viscoat viscoelastic.  0.5 ml of preservative-free 1% lidocaine was injected into the anterior chamber.  A 2.4 millimeter keratome was used to make a near-clear corneal incision at the 10:30 position.  A Malyugin pupil expander was then placed through the main incision and into the anterior chamber of the eye.  The edge of the iris was secured on the lip of the pupil expander and it was released, thereby expanding the pupil to approximately 7 millimeters for completion of the cataract surgery.  Additional Viscoat was placed  in the anterior chamber.  A cystotome and capsulorrhexis forceps were used to make a curvilinear capsulorrhexis.   Balanced salt solution was used to hydrodissect and hydrodelineate the lens nucleus.   Phacoemulsification was used in stop and chop fashion to remove the lens, nucleus and epinucleus.  The remaining cortex was aspirated using the irrigation aspiration handpiece.  Additional Provisc was placed into the eye to distend the capsular bag for lens placement.  A lens was then injected into the capsular bag.  The pupil expanding ring was removed using a Kuglen hook and insertion device. The remaining viscoelastic was aspirated from the capsular bag and the anterior chamber.  The anterior chamber was filled with balanced salt solution to inflate to a physiologic pressure.   Wounds were hydrated with balanced salt solution.  The anterior chamber was inflated to a physiologic pressure with balanced salt solution.  No wound leaks were noted. Cefuroxime 0.1 ml of a 10mg /ml solution was injected into the anterior chamber for a dose of 1 mg of intracameral antibiotic at the completion of the case.   Timolol and Brimonidine drops were applied to the eye.  The patient was taken to the recovery room in stable condition without complications of anesthesia or surgery.  [name] 04/14/2021, 8:37 AM

## 2021-04-14 NOTE — Patient Instructions (Signed)
Visit Information  Thank you for taking time to visit with me today. Please don't hesitate to contact me if I can be of assistance to you before our next scheduled telephone appointment.  Following are the goals we discussed today:   Goals Addressed             This Visit's Progress    Pharmacy - Patient Goals       Our goal A1c is less than 7%. This corresponds with fasting sugars less than 130 and 2 hour after meal sugars less than 180. Please check your fasting blood sugar each morning and keep a record of the results  Please check your blood pressure and keep record of the results  Feel free to call me with any questions or concerns. I look forward to our next call  Wallace Cullens, PharmD, [place] [phone]           Our next appointment is by telephone on 1/27 at 8:30 am  Please call the care guide team at [phone] if you need to cancel or reschedule your appointment.    Patient verbalizes understanding of instructions and care plan provided today and agrees to view in [place]. Active MyChart status confirmed with patient.

## 2021-04-14 NOTE — H&P (Signed)
[facility]   Primary Care Physician:  [name], DO Ophthalmologist: Dr. Leandrew Koyanagi  Pre-Procedure History & Physical: HPI:  [name] is a 71 y.o. male here for ophthalmic surgery.   Past Medical History:  Diagnosis Date   Allergy    Chronic headaches    CKD (chronic kidney disease), stage III (HCC)    Edentulous    no lower teeth   GERD (gastroesophageal reflux disease)    Gout    Heart burn    Hyperlipidemia    Hypertension    Motion sickness    boats   NSTEMI (non-ST elevated myocardial infarction) ([place]) 12/27/2019   Pulmonary embolism ([place]) 05/2017   Type 2 diabetes mellitus ([place])     Past Surgical History:  Procedure Laterality Date   BACK SURGERY  02/26/2019   KNEE SURGERY     LEFT HEART CATH AND CORONARY ANGIOGRAPHY N/A 12/30/2019   Procedure: LEFT HEART CATH AND CORONARY ANGIOGRAPHY;  Surgeon: Wellington Hampshire, MD;  Location: [facility];  Service: Cardiovascular;  Laterality: N/A;   SHOULDER SURGERY     TONSILLECTOMY      Prior to Admission medications   Medication Sig Start Date End Date Taking? Authorizing Provider  ACCU-CHEK SMARTVIEW test strip TEST BLOOD SUGAR TWICE DAILY 01/13/21  Yes Karamalegos, [name], DO  acetaminophen (TYLENOL) 500 MG tablet Take 1,000 mg by mouth every 8 (eight) hours as needed.    Yes [provider]  allopurinol (ZYLOPRIM) 100 MG tablet TAKE 1 TABLET AT BEDTIME 12/27/20  Yes Karamalegos, [name], DO  aspirin EC 81 MG EC tablet Take 1 tablet (81 mg total) by mouth daily. Swallow whole. 12/31/19  Yes Wieting, Richard, MD  calcium carbonate (TUMS - DOSED IN MG ELEMENTAL CALCIUM) 500 MG chewable tablet Chew 2 tablets by mouth at bedtime as needed for indigestion or heartburn.   Yes [provider]  carvedilol (COREG) 12.5 MG tablet TAKE 1 TABLET TWICE DAILY 12/27/20  Yes Karamalegos, [name], DO  clopidogrel (PLAVIX) 75 MG tablet Take 1 tablet (75 mg  total) by mouth daily. 03/23/21  Yes Agbor-Etang, Aaron Edelman, MD  DROPLET PEN NEEDLES 32G X 4 MM MISC USE WITH [name] INSULIN INJECTION DAILY AS INSTRUCTED 10/31/20  Yes Karamalegos, [name], DO  ezetimibe (ZETIA) 10 MG tablet TAKE 1 TABLET EVERY DAY 03/30/21  Yes Karamalegos, Alexander J, DO  fluticasone (FLONASE) 50 MCG/ACT nasal spray USE 2 SPRAYS IN EACH NOSTRIL ONCE DAILY USE FOR 4 TO 6 WEEKS, THEN STOP AND USE SEASONALLY OR AS NEEDED. 07/06/20  Yes Karamalegos, [name], DO  HYDROcodone-acetaminophen (NORCO/VICODIN) 5-325 MG tablet Take 1 tablet by mouth every 6 (six) hours as needed for moderate pain.   Yes [provider]  Insulin Glargine ([name] KWIKPEN) 100 UNIT/ML Inject 32 Units into the skin at bedtime. Patient taking differently: Inject 36 Units into the skin at bedtime. 12/07/20 04/27/21 Yes Karamalegos, [name], DO  isosorbide mononitrate (IMDUR) 30 MG 24 hr tablet Take 0.5 tablets (15 mg total) by mouth daily. 03/23/21 06/21/21 Yes Agbor-Etang, Aaron Edelman, MD  lisinopril (ZESTRIL) 40 MG tablet TAKE 1 TABLET EVERY DAY 12/27/20  Yes Karamalegos, Alexander J, DO  MITIGARE 0.6 MG CAPS Take for gout flare start Day 1 with 2 tablets then may take 1 extra tablet in 1 hour. Starting Day 2 take 1 tablet daily for up to 7-10 days until resolve. May increase to 1 tablet twice a day max dose if needed.  03/25/21  Yes Karamalegos, [name], DO  nitroGLYCERIN (NITROSTAT) 0.4 MG SL tablet Place 1 tablet (0.4 mg total) under the tongue every 5 (five) minutes as needed for chest pain. 12/31/19  Yes Wieting, Richard, MD  omeprazole (PRILOSEC) 20 MG capsule TAKE 1 CAPSULE EVERY DAY BEFORE BREAKFAST 12/27/20  Yes Karamalegos, [name], DO  Semaglutide (RYBELSUS) 7 MG TABS Take 1 tablet (7 mg) by mouth daily on an empty stomach, at least 30 minutes before the first food, beverage, or other oral medications of the day with 4 oz of plain water only   Yes [provider]    Allergies as of  03/19/2021 - Review Complete 02/16/2021  Allergen Reaction Noted   Crestor [rosuvastatin calcium] Other (See Comments) 06/15/2017   Invokana [canagliflozin] Other (See Comments) 04/26/2016   Prednisone Other (See Comments) 10/16/2014   Sulfamethizole  12/16/2019    Family History  Problem Relation Age of Onset   Cancer Father        lung   Heart disease Sister    Heart disease Brother    Prostate cancer Neg Hx     Social History   Socioeconomic History   Marital status: Married    Spouse name: Not on file   Number of children: Not on file   Years of education: Not on file   Highest education level: Not on file  Occupational History   Occupation: retired  Tobacco Use   Smoking status: Former    Types: Cigars    Quit date: 2000    Years since quitting: 23.0   Smokeless tobacco: Current    Types: Chew   Tobacco comments:    wife states patient never smoked cigarettes, just chewing tobacco  Vaping Use   Vaping Use: Never used  Substance and Sexual Activity   Alcohol use: Not Currently    Alcohol/week: 0.0 standard drinks   Drug use: No   Sexual activity: Yes    Birth control/protection: Inserts  Other Topics Concern   Not on file  Social History Narrative   Not on file   Social Determinants of Health   Financial Resource Strain: Low Risk    Difficulty of Paying Living Expenses: Not hard at all  Food Insecurity: No Food Insecurity   Worried About Charity fundraiser in the Last Year: Never true   [place] in the Last Year: Never true  Transportation Needs: No Transportation Needs   Lack of Transportation (Medical): No   Lack of Transportation (Non-Medical): No  Physical Activity: Inactive   Days of Exercise per Week: 0 days   Minutes of Exercise per Session: 0 min  Stress: No Stress Concern Present   Feeling of Stress : Not at all  Social Connections: Not on file  Intimate Partner Violence: Not on file    Review of Systems: See HPI, otherwise  negative ROS  Physical Exam: BP 140/81    Pulse 68    Temp 97.7 F (36.5 C) (Temporal)    Resp 20    Ht 5\' 10"  (1.778 m)    Wt 104.3 kg    SpO2 97%    BMI 33.00 kg/m  General:   Alert,  pleasant and cooperative in NAD Head:  Normocephalic and atraumatic. Lungs:  Clear to auscultation.    Heart:  Regular rate and rhythm.   Impression/Plan: Almanzo Redler is here for ophthalmic surgery.  Risks, benefits, limitations, and alternatives regarding ophthalmic surgery have been reviewed with  the patient.  Questions have been answered.  All parties agreeable.   Leandrew Koyanagi, MD  04/14/2021, 7:35 AM

## 2021-04-15 ENCOUNTER — Encounter: Payer: Self-pay | Admitting: Ophthalmology

## 2021-04-19 ENCOUNTER — Ambulatory Visit: Payer: Medicare HMO | Admitting: Pharmacist

## 2021-04-19 DIAGNOSIS — Z794 Long term (current) use of insulin: Secondary | ICD-10-CM

## 2021-04-19 DIAGNOSIS — E1169 Type 2 diabetes mellitus with other specified complication: Secondary | ICD-10-CM

## 2021-04-19 NOTE — Patient Instructions (Signed)
Visit Information ° °Thank you for taking time to visit with me today. Please don't hesitate to contact me if I can be of assistance to you before our next scheduled telephone appointment. ° °Following are the goals we discussed today:  ° Goals Addressed   ° °  °  °  °  ° This Visit's Progress  °  Pharmacy - Patient Goals     °  Our goal A1c is less than 7%. This corresponds with fasting sugars less than 130 and 2 hour after meal sugars less than 180. Please check your fasting blood sugar each morning and keep a record of the results ° °Please check your blood pressure and keep record of the results ° °Feel free to call me with any questions or concerns. I look forward to our next call ° ° °Tacuma Graffam Ronan Duecker, PharmD, BCACP °Clinical Pharmacist °[facility] ° °[phone] ° ° °  ° °  ° ° ° °Our next appointment is by telephone on 04/23/2021 at 8:30 AM ° °Please call the care guide team at [phone] if you need to cancel or reschedule your appointment.  ° ° °The patient verbalized understanding of instructions, educational materials, and care plan provided today and declined offer to receive copy of patient instructions, educational materials, and care plan.  ° °

## 2021-04-19 NOTE — Chronic Care Management (AMB) (Signed)
Chronic Care Management CCM Pharmacy Note  04/19/2021 Name:  [name] MRN:  [id number] DOB:  11/07/1950   Subjective: [name] is an 71 y.o. year old male who is a primary patient of Smitty CordsKaramalegos, Alexander J, DO.  The CCM team was consulted for assistance with disease management and care coordination needs.    Engaged with patient's wife (listed on DPR) by telephone for follow up visit for pharmacy case management and/or care coordination services.   Objective:  Medications    Reviewed by Elsie AmisSlemenda, Debra S, RN (Registered Nurse) on 04/14/21 at [id number]  Med List Status: <None>   Medication Order Taking? Sig Documenting Provider Last Dose Status Informant  ACCU-CHEK SMARTVIEW test strip [id number] Yes TEST BLOOD SUGAR TWICE DAILY Smitty CordsKaramalegos, Alexander J, DO 04/13/2021 Active   acetaminophen (TYLENOL) 500 MG tablet [id number] Yes Take 1,000 mg by mouth every 8 (eight) hours as needed.  [provider] 04/13/2021 Active Spouse/Significant Other  allopurinol (ZYLOPRIM) 100 MG tablet [id number] Yes TAKE 1 TABLET AT BEDTIME Smitty CordsKaramalegos, Alexander J, DO 04/13/2021 Active   aspirin EC 81 MG EC tablet [id number] Yes Take 1 tablet (81 mg total) by mouth daily. Swallow whole. Alford HighlandWieting, Richard, MD 04/13/2021 Active   calcium carbonate (TUMS - DOSED IN MG ELEMENTAL CALCIUM) 500 MG chewable tablet [id number] Yes Chew 2 tablets by mouth at bedtime as needed for indigestion or heartburn. [provider] 04/13/2021 Active   carvedilol (COREG) 12.5 MG tablet [id number] Yes TAKE 1 TABLET TWICE DAILY Smitty CordsKaramalegos, Alexander J, DO 04/13/2021 Active   clopidogrel (PLAVIX) 75 MG tablet [id number] Yes Take 1 tablet (75 mg total) by mouth daily. Debbe OdeaAgbor-Etang, Brian, MD 04/13/2021 Active   DROPLET PEN NEEDLES 32G X 4 MM MISC [id number] Yes USE WITH [name] INSULIN INJECTION DAILY AS INSTRUCTED Smitty CordsKaramalegos, Alexander J, DO 04/13/2021 Active   ezetimibe (ZETIA) 10 MG tablet [id number] Yes TAKE 1  TABLET EVERY DAY Smitty CordsKaramalegos, Alexander J, DO 04/13/2021 Active   fluticasone (FLONASE) 50 MCG/ACT nasal spray [id number] Yes USE 2 SPRAYS IN EACH NOSTRIL ONCE DAILY USE FOR 4 TO 6 WEEKS, THEN STOP AND USE SEASONALLY OR AS NEEDED. Smitty CordsKaramalegos, Alexander J, DO 04/13/2021 Active   HYDROcodone-acetaminophen (NORCO/VICODIN) 5-325 MG tablet [id number] Yes Take 1 tablet by mouth every 6 (six) hours as needed for moderate pain. [provider] 04/13/2021 Active   Insulin Glargine ([name] KWIKPEN) 100 UNIT/ML [id number] Yes Inject 32 Units into the skin at bedtime.  Patient taking differently: Inject 36 Units into the skin at bedtime.   Smitty CordsKaramalegos, Alexander J, DO 04/13/2021 Active   isosorbide mononitrate (IMDUR) 30 MG 24 hr tablet [id number] Yes Take 0.5 tablets (15 mg total) by mouth daily. Debbe OdeaAgbor-Etang, Brian, MD 04/13/2021 Active   lisinopril (ZESTRIL) 40 MG tablet [id number] Yes TAKE 1 TABLET EVERY DAY Smitty CordsKaramalegos, Alexander J, DO 04/13/2021 Active   MITIGARE 0.6 MG CAPS [id number] Yes Take for gout flare start Day 1 with 2 tablets then may take 1 extra tablet in 1 hour. Starting Day 2 take 1 tablet daily for up to 7-10 days until resolve. May increase to 1 tablet twice a day max dose if needed. Smitty CordsKaramalegos, Alexander J, DO 04/13/2021 Active   nitroGLYCERIN (NITROSTAT) 0.4 MG SL tablet [id number] Yes Place 1 tablet (0.4 mg total) under the tongue every 5 (five) minutes as needed for chest pain. Alford HighlandWieting, Richard, MD 04/13/2021 Active   omeprazole (PRILOSEC) 20 MG capsule [id number] Yes TAKE 1 CAPSULE EVERY DAY BEFORE BREAKFAST Smitty CordsKaramalegos, Alexander J, DO 04/13/2021 Active  Semaglutide (RYBELSUS) 7 MG TABS [id number] Yes Take 1 tablet (7 mg) by mouth daily on an empty stomach, at least 30 minutes before the first food, beverage, or other oral medications of the day with 4 oz of plain water only [provider] 04/13/2021 Active             Pertinent Labs:  Lab Results  Component Value Date    HGBA1C 10.4 (A) 02/16/2021   Lab Results  Component Value Date   CHOL 90 11/03/2020   HDL 32 (L) 11/03/2020   LDLCALC 36 11/03/2020   TRIG 141 11/03/2020   CHOLHDL 2.8 11/03/2020   Lab Results  Component Value Date   CREATININE 1.57 (H) 11/03/2020   BUN 26 (H) 11/03/2020   NA 135 11/03/2020   K 4.5 11/03/2020   CL 102 11/03/2020   CO2 25 11/03/2020    SDOH:  (Social Determinants of Health) assessments and interventions performed:    CCM Care Plan  Review of patient past medical history, allergies, medications, health status, including review of consultants reports, laboratory and other test data, was performed as part of comprehensive evaluation and provision of chronic care management services.   Care Plan : PharmD - Medication Management  Updates made by Manuela Neptune, RPH-CPP since 04/19/2021 12:00 AM     Problem: Disease Progression      Long-Range Goal: Disease Progression Prevented or Minimized   Start Date: 04/22/2020  Expected End Date: 07/21/2020  Recent Progress: On track  Priority: High  Note:   Current Barriers:  Financial Barriers - patient has [employer] and reports copay for Bydureon & insulin glargine is cost prohibitive at this time Patient APPROVED for patient assistance for [name] from [name] through through 03/27/2022 Patient APPROVED to receive Rybelsus from [employer] patient assistance program through 03/27/21 Lack of blood sugar or blood pressure readings for clinical team  Pharmacist Clinical Goal(s):  Over the next 90 days, patient will achieve adherence to monitoring guidelines and medication adherence to achieve therapeutic efficacy through collaboration with PharmD and provider.   Interventions: 1:1 collaboration with [name], DO regarding development and update of comprehensive plan of care as evidenced by provider attestation and co-signature Inter-disciplinary care team collaboration (see  longitudinal plan of care)  Medication Assistance: Collaborating with [facility] CPhT Noreene Larsson Simcox for assistance to patient with re-enrollment application for [employer] patient assistance for 2023 Collaborated with PCP to request provider fax updated Rybelsus 14 mg daily Rx to [employer] patient assistance program using program's change request form. Provider advised form completed and to be faxed to program on 1/19 Today [facility] CPhT placed follow up call to [employer] Advises per program representative: Dose change request form not yet received; requests provider re-fax to program Will follow up with PCP Program requires updated proof of household income documents from patient Place follow up call to patient today and reach his spouse. Patient's wife confirms has updated copies of these proof of income documents and will bring these by the office to be faxed to [facility] [phone])  Patient Goals/Self-Care Activities Over the next 90 days, patient will:  - take medications as directed  Patient uses weekly pillbox to manage medications - check glucose, document, and provide at future appointments - check blood pressure, document, and provide at future appointments - attend medical appointments as scheduled  Follow Up Plan: Telephone follow up appointment with care management team member scheduled for: 04/23/2021 at  8:30 AM       Estelle Grumbles, PharmD, P[place]y [place], CPP Clinical Pharmacist [facility] [phone]

## 2021-04-23 ENCOUNTER — Ambulatory Visit: Payer: Medicare HMO | Admitting: Pharmacist

## 2021-04-23 DIAGNOSIS — I129 Hypertensive chronic kidney disease with stage 1 through stage 4 chronic kidney disease, or unspecified chronic kidney disease: Secondary | ICD-10-CM

## 2021-04-23 DIAGNOSIS — E785 Hyperlipidemia, unspecified: Secondary | ICD-10-CM

## 2021-04-23 DIAGNOSIS — N183 Chronic kidney disease, stage 3 unspecified: Secondary | ICD-10-CM

## 2021-04-23 DIAGNOSIS — E1169 Type 2 diabetes mellitus with other specified complication: Secondary | ICD-10-CM

## 2021-04-23 NOTE — Chronic Care Management (AMB) (Signed)
Chronic Care Management CCM Pharmacy Note  04/23/2021 Name:  [name] MRN:  [id number] DOB:  03-21-1951  Subjective: [name] is an 71 y.o. year old male who is a primary patient of Daniel Hauser, DO.  The CCM team was consulted for assistance with disease management and care coordination needs.    Engaged with patient by telephone for follow up visit for pharmacy case management and/or care coordination services.   Objective:  Medications Reviewed Today     Reviewed by Rica Records, RN (Registered Nurse) on 04/14/21 at [phone]  Med List Status: <None>   Medication Order Taking? Sig Documenting Provider Last Dose Status Informant  ACCU-CHEK SMARTVIEW test strip [id number] Yes TEST BLOOD SUGAR TWICE DAILY Daniel Hauser, DO 04/13/2021 Active   acetaminophen (TYLENOL) 500 MG tablet [id number] Yes Take 1,000 mg by mouth every 8 (eight) hours as needed.  [provider] 04/13/2021 Active Spouse/Significant Other  allopurinol (ZYLOPRIM) 100 MG tablet [id number] Yes TAKE 1 TABLET AT BEDTIME Daniel Hauser, DO 04/13/2021 Active   aspirin EC 81 MG EC tablet [id number] Yes Take 1 tablet (81 mg total) by mouth daily. Swallow whole. [name], MD 04/13/2021 Active   calcium carbonate (TUMS - DOSED IN MG ELEMENTAL CALCIUM) 500 MG chewable tablet [id number] Yes Chew 2 tablets by mouth at bedtime as needed for indigestion or heartburn. [provider] 04/13/2021 Active   carvedilol (COREG) 12.5 MG tablet [id number] Yes TAKE 1 TABLET TWICE DAILY Daniel Hauser, DO 04/13/2021 Active   clopidogrel (PLAVIX) 75 MG tablet [id number] Yes Take 1 tablet (75 mg total) by mouth daily. [name], MD 04/13/2021 Active   DROPLET PEN NEEDLES 32G X 4 MM MISC [id number] Yes USE WITH [name] INSULIN INJECTION DAILY AS INSTRUCTED Daniel Hauser, DO 04/13/2021 Active   ezetimibe (ZETIA) 10 MG tablet [id number] Yes TAKE 1 TABLET EVERY  DAY Daniel Hauser, DO 04/13/2021 Active   fluticasone (FLONASE) 50 MCG/ACT nasal spray [id number] Yes USE 2 SPRAYS IN EACH NOSTRIL ONCE DAILY USE FOR 4 TO 6 WEEKS, THEN STOP AND USE SEASONALLY OR AS NEEDED. Daniel Hauser, DO 04/13/2021 Active   HYDROcodone-acetaminophen (NORCO/VICODIN) 5-325 MG tablet [id number] Yes Take 1 tablet by mouth every 6 (six) hours as needed for moderate pain. [provider] 04/13/2021 Active   Insulin Glargine ([name] KWIKPEN) 100 UNIT/ML [id number] Yes Inject 32 Units into the skin at bedtime.  Patient taking differently: Inject 36 Units into the skin at bedtime.   Daniel Hauser, DO 04/13/2021 Active   isosorbide mononitrate (IMDUR) 30 MG 24 hr tablet [id number] Yes Take 0.5 tablets (15 mg total) by mouth daily. [name], MD 04/13/2021 Active   lisinopril (ZESTRIL) 40 MG tablet [id number] Yes TAKE 1 TABLET EVERY DAY Daniel Hauser, DO 04/13/2021 Active   MITIGARE 0.6 MG CAPS [id number] Yes Take for gout flare start Day 1 with 2 tablets then may take 1 extra tablet in 1 hour. Starting Day 2 take 1 tablet daily for up to 7-10 days until resolve. May increase to 1 tablet twice a day max dose if needed. Daniel Hauser, DO 04/13/2021 Active   nitroGLYCERIN (NITROSTAT) 0.4 MG SL tablet [id number] Yes Place 1 tablet (0.4 mg total) under the tongue every 5 (five) minutes as needed for chest pain. [name], MD 04/13/2021 Active   omeprazole (PRILOSEC) 20 MG capsule [id number] Yes TAKE 1 CAPSULE EVERY DAY BEFORE BREAKFAST Daniel Hauser, DO 04/13/2021 Active   Semaglutide (  RYBELSUS) 7 MG TABS [id number] Yes Take 1 tablet (7 mg) by mouth daily on an empty stomach, at least 30 minutes before the first food, beverage, or other oral medications of the day with 4 oz of plain water only [provider] 04/13/2021 Active             Pertinent Labs:  Lab Results  Component Value Date   HGBA1C 10.4 (A)  02/16/2021   Lab Results  Component Value Date   CHOL 90 11/03/2020   HDL 32 (L) 11/03/2020   LDLCALC 36 11/03/2020   TRIG 141 11/03/2020   CHOLHDL 2.8 11/03/2020   Lab Results  Component Value Date   CREATININE 1.57 (H) 11/03/2020   BUN 26 (H) 11/03/2020   NA 135 11/03/2020   K 4.5 11/03/2020   CL 102 11/03/2020   CO2 25 11/03/2020    SDOH:  (Social Determinants of Health) assessments and interventions performed:    [place]  Review of patient past medical history, allergies, medications, health status, including review of consultants reports, laboratory and other test data, was performed as part of comprehensive evaluation and provision of chronic care management services.   Care Plan : PharmD - Medication Management  Updates made by [name], RPH-CPP since 04/23/2021 12:00 AM     Problem: Disease Progression      Long-Range Goal: Disease Progression Prevented or Minimized   Start Date: 04/22/2020  Expected End Date: 07/21/2020  Recent Progress: On track  Priority: High  Note:   Current Barriers:  Financial Barriers - patient has [employer] and reports copay for Bydureon & insulin glargine is cost prohibitive at this time Patient APPROVED for patient assistance for [name] from [place] through through 03/27/2022 Patient APPROVED to receive Rybelsus from [employer] patient assistance program through 03/27/21 Lack of blood sugar or blood pressure readings for clinical team  Pharmacist Clinical Goal(s):  Over the next 90 days, patient will achieve adherence to monitoring guidelines and medication adherence to achieve therapeutic efficacy through collaboration with PharmD and provider.   Interventions: 1:1 collaboration with Daniel Hauser, DO regarding development and update of comprehensive plan of care as evidenced by provider attestation and co-signature Inter-disciplinary care team collaboration (see longitudinal plan of  care)  Type 2 Diabetes: Uncontrolled; current treatment: Rybelsus 7 mg - 2 tablets (14 mg) daily ?30 minutes before the first food, beverage, or other oral medications of the day with ?4 oz of plain water  Reports tolerating well [name] 37 units QHS Previous therapies tried: Trulicity (GI intolerance) Metformin (unable to tolerate - GI side effects) Bydureon (difficulty with device/administration) Invokana (felt dehydrated) Reports recent home blood sugar readings:  Morning fasting Notes  20 - January 189   21 - January -   22 - January -   23 - January 181   24 - January 171   25 - January 178   26 - January 153   27 - January 210* *Large portion of spaghetti and rolls for dinner and candy overnight   Attributes elevated blood sugars to some days eating larger portion size of supper (particularly if no snack between lunch and supper) and eating candy overnight  Discuss making positive dietary changes to have well-balanced meals and reduce carbohydrate portion sizes Consistently having carbohydrate + protein snack between lunch and supper to help have smaller portion size at supper Reducing snacking/sweets overnight Discuss benefits of continuous blood glucose monitor and offered to investigate health plan  coverage for CBGM Patient declines for now Encouraged patient to use glucometer to check blood sugar 2 hours after meals (breakfast, lunch or supper) a couple of times/week to use as feedback on impact of dietary choices on blood sugar  Exercise: recently limited post eye surgery, but planning to increase activity again Advise patient, may continue to increase [name] dose by 1-2 units every 7 days, if fasting CBG >150 consistently Patient to monitor for s/s of hypoglycemia   Hyperlipidemia: Controlled; current treatment: Ezetimibe 10 mg daily - reports currently taking every other day Previous therapies tried: atorvastatin 40 mg daily & 10 mg daily (unable to tolerate due  to myalgia)   Hypertension: Current treatment: Carvedilol 12.5 mg twice daily Lisinopril 40 mg daily  Reports last checked home BP today: 134/78, HR 69 Counsel patient on blood pressure monitoring technique, including resting prior to readings Encourage patient to continue to monitor home blood pressure, keep log of results, have to review at upcoming appointments, but to contact office sooner for readings outside of established parameters   Medication Assistance: Collaborating with [facility] for assistance to patient with re-enrollment application for [employer] patient assistance for 2023 Collaborated with PCP to request provider fax updated Rybelsus 14 mg daily Rx to [employer] patient assistance program using program's change request form. Collaborated with provider and [facility] CPhT faxed to program on 1/24 Patient reports currently has ~20 day supply of Rybelsus remaining From review of chart, note [facility] CPhT contacted [name] patient assistance program today and patient re-enrollment for [place] assistance APPROVED 03/28/21-03/27/22, medication to be shipped based on last fill date in 2022. Patient's wife reports that she has followed up with [name] regarding patient's need for a refill of [name] Offer to send Rx for Rybelsus and/or [name] to local pharmacy for patient in case needs to obtain supply from local pharmacy/if does not come from patient assistance program in time Patient declines for now, stating prefers to wait on assistance program, but will contact office if needs Rx called in to pharmacy  Patient Goals/Self-Care Activities Over the next 90 days, patient will:  - take medications as directed  Patient uses weekly pillbox to manage medications - check glucose, document, and provide at future appointments - check blood pressure, document, and provide at future appointments - attend medical appointments as scheduled  Follow Up Plan: Telephone follow up  appointment with care management team member scheduled for: 05/07/2021 at 8:30 am       Wallace Cullens, PharmD, [place], [place] [phone]

## 2021-04-23 NOTE — Patient Instructions (Signed)
Visit Information  Thank you for taking time to visit with me today. Please don't hesitate to contact me if I can be of assistance to you before our next scheduled telephone appointment.  Following are the goals we discussed today:   Goals Addressed             This Visit's Progress    Pharmacy - Patient Goals       Our goal A1c is less than 7%. This corresponds with fasting sugars less than 130 and 2 hour after meal sugars less than 180. Please check your fasting blood sugar each morning and keep a record of the results  Please check your blood pressure and keep record of the results  Feel free to call me with any questions or concerns. I look forward to our next call    Wallace Cullens, PharmD, [place] [phone]           Our next appointment is by telephone on 05/07/2021 at 8:30 am  Please call the care guide team at [phone] if you need to cancel or reschedule your appointment.    The patient verbalized understanding of instructions, educational materials, and care plan provided today and declined offer to receive copy of patient instructions, educational materials, and care plan.

## 2021-04-27 DIAGNOSIS — E1169 Type 2 diabetes mellitus with other specified complication: Secondary | ICD-10-CM | POA: Diagnosis not present

## 2021-04-27 DIAGNOSIS — E785 Hyperlipidemia, unspecified: Secondary | ICD-10-CM

## 2021-04-27 DIAGNOSIS — I129 Hypertensive chronic kidney disease with stage 1 through stage 4 chronic kidney disease, or unspecified chronic kidney disease: Secondary | ICD-10-CM | POA: Diagnosis not present

## 2021-04-27 DIAGNOSIS — Z794 Long term (current) use of insulin: Secondary | ICD-10-CM | POA: Diagnosis not present

## 2021-04-27 DIAGNOSIS — N183 Chronic kidney disease, stage 3 unspecified: Secondary | ICD-10-CM | POA: Diagnosis not present

## 2021-04-30 ENCOUNTER — Encounter: Payer: Self-pay | Admitting: Family Medicine

## 2021-04-30 DIAGNOSIS — E1169 Type 2 diabetes mellitus with other specified complication: Secondary | ICD-10-CM

## 2021-04-30 MED ORDER — BASAGLAR KWIKPEN 100 UNIT/ML ~~LOC~~ SOPN: 37.0000 [IU] | PEN_INJECTOR | Freq: Every day | SUBCUTANEOUS | 0 refills | Status: DC

## 2021-05-02 ENCOUNTER — Emergency Department: Payer: Medicare HMO

## 2021-05-02 ENCOUNTER — Other Ambulatory Visit: Payer: Self-pay

## 2021-05-02 ENCOUNTER — Emergency Department
Admission: EM | Admit: 2021-05-02 | Discharge: 2021-05-02 | Disposition: A | Discharge status: Home / Self Care | Payer: Medicare HMO | Attending: Emergency Medicine | Admitting: Emergency Medicine

## 2021-05-02 ENCOUNTER — Encounter: Payer: Self-pay | Admitting: Emergency Medicine

## 2021-05-02 DIAGNOSIS — R072 Precordial pain: Secondary | ICD-10-CM | POA: Diagnosis not present

## 2021-05-02 DIAGNOSIS — I2699 Other pulmonary embolism without acute cor pulmonale: Secondary | ICD-10-CM | POA: Diagnosis not present

## 2021-05-02 DIAGNOSIS — N183 Chronic kidney disease, stage 3 unspecified: Secondary | ICD-10-CM | POA: Diagnosis not present

## 2021-05-02 DIAGNOSIS — R0789 Other chest pain: Secondary | ICD-10-CM | POA: Diagnosis not present

## 2021-05-02 DIAGNOSIS — R079 Chest pain, unspecified: Secondary | ICD-10-CM | POA: Diagnosis not present

## 2021-05-02 DIAGNOSIS — I129 Hypertensive chronic kidney disease with stage 1 through stage 4 chronic kidney disease, or unspecified chronic kidney disease: Secondary | ICD-10-CM | POA: Diagnosis not present

## 2021-05-02 DIAGNOSIS — E1122 Type 2 diabetes mellitus with diabetic chronic kidney disease: Secondary | ICD-10-CM | POA: Diagnosis not present

## 2021-05-02 DIAGNOSIS — I517 Cardiomegaly: Secondary | ICD-10-CM | POA: Diagnosis not present

## 2021-05-02 LAB — HEPATIC FUNCTION PANEL
ALT: 18 U/L (ref 0–44)
AST: 21 U/L (ref 15–41)
Albumin: 3.7 g/dL (ref 3.5–5.0)
Alkaline Phosphatase: 59 U/L (ref 38–126)
Bilirubin, Direct: 0.1 mg/dL (ref 0.0–0.2)
Indirect Bilirubin: 0.6 mg/dL (ref 0.3–0.9)
Total Bilirubin: 0.7 mg/dL (ref 0.3–1.2)
Total Protein: 7.3 g/dL (ref 6.5–8.1)

## 2021-05-02 LAB — CBC
HCT: 42.7 % (ref 39.0–52.0)
Hemoglobin: 14 g/dL (ref 13.0–17.0)
MCH: 28.9 pg (ref 26.0–34.0)
MCHC: 32.8 g/dL (ref 30.0–36.0)
MCV: 88 fL (ref 80.0–100.0)
Platelets: 243 10*3/uL (ref 150–400)
RBC: 4.85 MIL/uL (ref 4.22–5.81)
RDW: 14.1 % (ref 11.5–15.5)
WBC: 5.4 10*3/uL (ref 4.0–10.5)
nRBC: 0 % (ref 0.0–0.2)

## 2021-05-02 LAB — TROPONIN I (HIGH SENSITIVITY)
Troponin I (High Sensitivity): 6 ng/L (ref <18)
Troponin I (High Sensitivity): 7 ng/L (ref <18)

## 2021-05-02 LAB — BASIC METABOLIC PANEL
Anion gap: 6 (ref 5–15)
BUN: 19 mg/dL (ref 8–23)
CO2: 25 mmol/L (ref 22–32)
Calcium: 9.2 mg/dL (ref 8.9–10.3)
Chloride: 104 mmol/L (ref 98–111)
Creatinine, Ser: 1.42 mg/dL — ABNORMAL HIGH (ref 0.61–1.24)
GFR, Estimated: 53 mL/min — ABNORMAL LOW (ref >60)
Glucose, Bld: 152 mg/dL — ABNORMAL HIGH (ref 70–99)
Potassium: 4.2 mmol/L (ref 3.5–5.1)
Sodium: 135 mmol/L (ref 135–145)

## 2021-05-02 LAB — D-DIMER, QUANTITATIVE: D-Dimer, Quant: 1.55 ug/mL-FEU — ABNORMAL HIGH (ref 0.00–0.50)

## 2021-05-02 LAB — LIPASE, BLOOD: Lipase: 38 U/L (ref 11–51)

## 2021-05-02 MED ORDER — ONDANSETRON HCL 4 MG/2ML IJ SOLN
4.0000 mg | Freq: Once | INTRAMUSCULAR | Status: AC
  Administered 2021-05-02: 4 mg via INTRAVENOUS
  Filled 2021-05-02: qty 2

## 2021-05-02 MED ORDER — ALUM & MAG HYDROXIDE-SIMETH 200-200-20 MG/5ML PO SUSP
30.0000 mL | Freq: Once | ORAL | Status: AC
  Administered 2021-05-02: 30 mL via ORAL
  Filled 2021-05-02: qty 30

## 2021-05-02 MED ORDER — IOHEXOL 350 MG/ML SOLN
75.0000 mL | Freq: Once | INTRAVENOUS | Status: AC | PRN
  Administered 2021-05-02: 75 mL via INTRAVENOUS

## 2021-05-02 MED ORDER — LIDOCAINE VISCOUS HCL 2 % MT SOLN
15.0000 mL | Freq: Once | OROMUCOSAL | Status: AC
  Administered 2021-05-02: 15 mL via ORAL
  Filled 2021-05-02: qty 15

## 2021-05-02 MED ORDER — MORPHINE SULFATE (PF) 4 MG/ML IV SOLN
4.0000 mg | Freq: Once | INTRAVENOUS | Status: AC
  Administered 2021-05-02: 4 mg via INTRAVENOUS
  Filled 2021-05-02: qty 1

## 2021-05-02 MED ORDER — ASPIRIN 81 MG PO CHEW
324.0000 mg | CHEWABLE_TABLET | Freq: Once | ORAL | Status: AC
Start: 2021-05-02 — End: 2021-05-02
  Administered 2021-05-02: 324 mg via ORAL
  Filled 2021-05-02: qty 4

## 2021-05-02 NOTE — ED Provider Notes (Signed)
[facility] Provider Note    Event Date/Time   First MD Initiated Contact with Patient 05/02/21 1258     (approximate)   History   Chest Pain   HPI  [name] is a 71 y.o. male with history of diabetes hypertension,, GERD, CKD stage III and as listed in EMR presents to the emergency department for treatment and evaluation of midsternal chest pain.  Pain started yesterday morning. Patient states it feels like a  "knife in the chest." He took his daily medicines as prescribed thinking they would help. Yesterday evening, he started to feel weaker and still had pain in his chest. The took a total of (4) Nitroglycerin SL tablets. No relief with any of them. He decided to come in today because the weakness and chest pain continues. Last nitroglycerin was about 4am.      Physical Exam   Today's Vitals   05/02/21 1500 05/02/21 1515 05/02/21 1530 05/02/21 1545  BP: (!) 156/94  (!) 148/104   Pulse: 70 69 68 78  Resp: 19 17 20 20   Temp:      TempSrc:      SpO2: 95% 95% 96% 96%  Weight:      Height:      PainSc:       Body mass index is 31.38 kg/m.   Most recent vital signs: Vitals:   05/02/21 1530 05/02/21 1545  BP: (!) 148/104   Pulse: 68 78  Resp: 20 20  Temp:    SpO2: 96% 96%    General: Awake, no distress.  CV:  Good peripheral perfusion. Midsternal chest pain. Resp:  Normal effort. Breath sounds clear. Abd:  No distention.  Other:  No peripheral edema.   ED Results / Procedures / Treatments   Labs (all labs ordered are listed, but only abnormal results are displayed) Labs Reviewed  BASIC METABOLIC PANEL - Abnormal; Notable for the following components:      Result Value   Glucose, Bld 152 (*)    Creatinine, Ser 1.42 (*)    GFR, Estimated 53 (*)    All other components within normal limits  CBC  HEPATIC FUNCTION PANEL  LIPASE, BLOOD  D-DIMER, QUANTITATIVE  TROPONIN I (HIGH SENSITIVITY)  TROPONIN I (HIGH SENSITIVITY)      EKG  ED ECG REPORT I, Maloree Uplinger, FNP-BC personally viewed and interpreted this ECG.   Date: 05/02/2021  EKG Time: 1258  Rate: 76  Rhythm: normal EKG, normal sinus rhythm, prolonged QT interval  Axis: normal  Intervals:none  ST&T Change: no ST elevation    RADIOLOGY  Image and radiology report reviewed by me.  Chest x-ray negative for acute concerns.  PROCEDURES:  Critical Care performed: No  Procedures   MEDICATIONS ORDERED IN ED: Medications  alum & mag hydroxide-simeth (MAALOX/MYLANTA) 200-200-20 MG/5ML suspension 30 mL (30 mLs Oral Given 05/02/21 1425)    And  lidocaine (XYLOCAINE) 2 % viscous mouth solution 15 mL (15 mLs Oral Given 05/02/21 1425)  morphine (PF) 4 MG/ML injection 4 mg (4 mg Intravenous Given 05/02/21 1542)  ondansetron (ZOFRAN) injection 4 mg (4 mg Intravenous Given 05/02/21 1542)  aspirin chewable tablet 324 mg (324 mg Oral Given 05/02/21 1542)     IMPRESSION / MDM / ASSESSMENT AND PLAN / ED COURSE   I have reviewed the triage note.  Differential diagnosis includes, but is not limited to, CAD, GERD, chest wall strain  The patient is on the cardiac monitor to evaluate  for evidence of arrhythmia and/or significant heart rate changes.  71 year old male presenting to the emergency department for treatment and evaluation of midsternal chest pain.  See HPI for further details.  In addition, patient tells me that this chest pain has felt different from previous cardiac events.  He describes cardiac related chest pain as "needles."  Pain seemed to get worse last night when he was laying down.  Initial troponin, EKG, chest x-ray are all reassuring.  Bedside monitor shows a heart rate of 69, sinus rhythm at this time.  Plan will be to order GI cocktail.  Symptoms somewhat consistent with GERD.  We will also get the second troponin due to his extensive cardiac history.  No improvement after GI cocktail. Second troponin also negative. Care will be  relinquished to Dr. Tamala Julian who will follow up on D-dimer ordered by Dr. Jari Pigg and likely admit due to cardiac history.      FINAL CLINICAL IMPRESSION(S) / ED DIAGNOSES   Final diagnoses:  Chest pain, unspecified type     Rx / DC Orders   ED Discharge Orders     None        Note:  This document was prepared using Dragon voice recognition software and may include unintentional dictation errors.   Victorino Dike, FNP 05/02/21 1549    Vladimir Crofts, MD 05/02/21 [phone]

## 2021-05-02 NOTE — ED Triage Notes (Signed)
Pt via POV from home. Pt is c/o L chest pain that started last night. Pt states that it is stabbing in nature. Pt has a hx of a MI in Oct 2022. Pt states she took a nitro last night and 2 this AM with no relief. Pt is A&OX4 and NAD

## 2021-05-04 ENCOUNTER — Encounter: Payer: Self-pay | Admitting: Nurse Practitioner

## 2021-05-04 ENCOUNTER — Other Ambulatory Visit: Payer: Self-pay

## 2021-05-04 ENCOUNTER — Telehealth: Payer: Self-pay | Admitting: Pharmacy Technician

## 2021-05-04 ENCOUNTER — Ambulatory Visit: Payer: Medicare HMO | Admitting: Nurse Practitioner

## 2021-05-04 VITALS — BP 110/70 | HR 80 | Ht 71.0 in | Wt 230.0 lb

## 2021-05-04 DIAGNOSIS — R072 Precordial pain: Secondary | ICD-10-CM | POA: Diagnosis not present

## 2021-05-04 DIAGNOSIS — K219 Gastro-esophageal reflux disease without esophagitis: Secondary | ICD-10-CM

## 2021-05-04 DIAGNOSIS — N183 Chronic kidney disease, stage 3 unspecified: Secondary | ICD-10-CM | POA: Diagnosis not present

## 2021-05-04 DIAGNOSIS — I10 Essential (primary) hypertension: Secondary | ICD-10-CM

## 2021-05-04 DIAGNOSIS — Z596 Low income: Secondary | ICD-10-CM

## 2021-05-04 DIAGNOSIS — I25118 Atherosclerotic heart disease of native coronary artery with other forms of angina pectoris: Secondary | ICD-10-CM

## 2021-05-04 DIAGNOSIS — Z794 Long term (current) use of insulin: Secondary | ICD-10-CM

## 2021-05-04 DIAGNOSIS — E785 Hyperlipidemia, unspecified: Secondary | ICD-10-CM | POA: Diagnosis not present

## 2021-05-04 DIAGNOSIS — E1169 Type 2 diabetes mellitus with other specified complication: Secondary | ICD-10-CM | POA: Diagnosis not present

## 2021-05-04 MED ORDER — LANTUS SOLOSTAR 100 UNIT/ML ~~LOC~~ SOPN: 37.0000 [IU] | PEN_INJECTOR | Freq: Every day | SUBCUTANEOUS | 0 refills | Status: DC

## 2021-05-04 MED ORDER — OMEPRAZOLE 20 MG PO CPDR: 20.0000 mg | DELAYED_RELEASE_CAPSULE | Freq: Two times a day (BID) | ORAL | 6 refills | Status: DC

## 2021-05-04 NOTE — Progress Notes (Signed)
Office Visit    Patient Name: [name] Date of Encounter: 05/04/2021  Primary Care Provider:  [name], DO Primary Cardiologist:  [name], MD  Chief Complaint    71 year old male with a history of coronary artery disease status post prior RCA stenting, hypertension, hyperlipidemia, diabetes, pulmonary embolism, GERD, and stage III chronic kidney disease, who presents for follow-up after recent ED visit for chest pain.    Past Medical History    Past Medical History:  Diagnosis Date   Allergy    CAD (coronary artery disease)    a. 01/2019 MV: EF 55-65%, no ischemia; b. 12/2019 NSTEMI/PCI: LM nl, LAD 40p, 24m, D1 40, LCX nl, OM3 40, RCA 90/85p (2.75x34 Resolute Onyx DES), 54m/d.   Chronic headaches    CKD (chronic kidney disease), stage III (HCC)    Edentulous    no lower teeth   GERD (gastroesophageal reflux disease)    Gout    Heart burn    History of echocardiogram    a. 12/2019 Echo: EF 60-65%, no rwma, triv MR.   Hyperlipidemia    Hypertension    Motion sickness    boats   NSTEMI (non-ST elevated myocardial infarction) ([place]) 12/27/2019   Pulmonary embolism ([place]) 05/2017   Type 2 diabetes mellitus [facility])    Past Surgical History:  Procedure Laterality Date   BACK SURGERY  02/26/2019   CATARACT EXTRACTION W/PHACO Left 04/14/2021   Procedure: CATARACT EXTRACTION PHACO AND INTRAOCULAR LENS PLACEMENT ([place]) LEFT DIABETIC maylugin;  Surgeon: Leandrew Koyanagi, MD;  Location: [place];  Service: Ophthalmology;  Laterality: Left;  11.22 1:25.1   KNEE SURGERY     LEFT HEART CATH AND CORONARY ANGIOGRAPHY N/A 12/30/2019   Procedure: LEFT HEART CATH AND CORONARY ANGIOGRAPHY;  Surgeon: Wellington Hampshire, MD;  Location: [facility];  Service: Cardiovascular;  Laterality: N/A;   SHOULDER SURGERY     TONSILLECTOMY      Allergies  Allergies  Allergen Reactions   Crestor [Rosuvastatin Calcium] Other (See Comments)     myalgia   Invokana [Canagliflozin] Other (See Comments)    dehydrated   Prednisone Other (See Comments)   Sulfamethizole     History of Present Illness    71 year old male with above past medical history including CAD, hypertension, hyperlipidemia, diabetes, pulmonary embolism, GERD, and stage III chronic kidney disease.  [name] was previously evaluated in November 2020 with fatigue and dyspnea, and underwent stress testing which was low risk without evidence of ischemia or infarct.  He subsequently was admitted to [facility] in October 2021 with chest pain and non-STEMI.  Diagnostic catheterization revealed severe proximal RCA disease and this was treated with a drug-eluting stent.  Echo during that admission showed an EF of 60 to 65% with trivial MR.  [name] was last seen in cardiology clinic in December 2022, which time he continued to report occasional discomfort in his chest, unrelated to exertion.  Symptoms were lasting a few seconds and resolving spontaneously.  Continued medical therapy was advised and Imdur 15 mg daily was started.  He also complained of easy bruisability and Brilinta was discontinued in favor of Plavix.  He notes that following that visit, he was doing well from a chest pain standpoint.  Unfortunately, he developed chest pain on February 4 that was described as a "knife in the chest," and persisted throughout the day, becoming associated with weakness.  He presented to the emergency department on February 5,  where ECG was unremarkable and despite prolonged symptoms, troponins were normal.  His D-dimer was elevated and a CTA of the chest was performed and was negative for PE or other acute findings.  He says he received morphine and GI cocktail and at some point symptoms resolved and he was discharged home and advised to follow-up with cardiology.  He did well yesterday but then this morning, when walking in his yard, he noted recurrence of mild, 4/10 dull discomfort in  his lower sternum, that does not change with palpation, deep breathing, or position changes.  This is similar to the discomfort that he is experienced over the past few years.  He denies dyspnea, palpitations, PND, orthopnea, dizziness, syncope, edema, or early satiety.  Home Medications    Current Outpatient Medications  Medication Sig Dispense Refill   ACCU-CHEK SMARTVIEW test strip TEST BLOOD SUGAR TWICE DAILY 200 strip 0   acetaminophen (TYLENOL) 500 MG tablet Take 1,000 mg by mouth every 8 (eight) hours as needed.      allopurinol (ZYLOPRIM) 100 MG tablet TAKE 1 TABLET AT BEDTIME 90 tablet 3   aspirin EC 81 MG EC tablet Take 1 tablet (81 mg total) by mouth daily. Swallow whole. 30 tablet 0   calcium carbonate (TUMS - DOSED IN MG ELEMENTAL CALCIUM) 500 MG chewable tablet Chew 2 tablets by mouth at bedtime as needed for indigestion or heartburn.     carvedilol (COREG) 12.5 MG tablet TAKE 1 TABLET TWICE DAILY 180 tablet 3   clopidogrel (PLAVIX) 75 MG tablet Take 1 tablet (75 mg total) by mouth daily. 90 tablet 3   DROPLET PEN NEEDLES 32G X 4 MM MISC USE WITH [name] INSULIN INJECTION DAILY AS INSTRUCTED 100 each 1   ezetimibe (ZETIA) 10 MG tablet TAKE 1 TABLET EVERY DAY 90 tablet 3   fluticasone (FLONASE) 50 MCG/ACT nasal spray USE 2 SPRAYS IN EACH NOSTRIL ONCE DAILY USE FOR 4 TO 6 WEEKS, THEN STOP AND USE SEASONALLY OR AS NEEDED. 48 g 0   Insulin Glargine ([name] KWIKPEN [place]) Inject 38 Units into the skin.     isosorbide mononitrate (IMDUR) 30 MG 24 hr tablet Take 0.5 tablets (15 mg total) by mouth daily. 45 tablet 3   lisinopril (ZESTRIL) 40 MG tablet TAKE 1 TABLET EVERY DAY 90 tablet 3   MITIGARE 0.6 MG CAPS Take for gout flare start Day 1 with 2 tablets then may take 1 extra tablet in 1 hour. Starting Day 2 take 1 tablet daily for up to 7-10 days until resolve. May increase to 1 tablet twice a day max dose if needed. 30 capsule 2   nitroGLYCERIN (NITROSTAT) 0.4 MG SL tablet Place 1 tablet  (0.4 mg total) under the tongue every 5 (five) minutes as needed for chest pain. 30 tablet 0   omeprazole (PRILOSEC) 20 MG capsule Take 1 capsule (20 mg total) by mouth 2 (two) times daily. 60 capsule 6   Semaglutide (RYBELSUS) 7 MG TABS 14 mg. Take 1 tablet (14 mg) by mouth daily on an empty stomach, at least 30 minutes before the first food, beverage, or other oral medications of the day with 4 oz of plain water only     Exenatide ER (BYDUREON BCISE) 2 MG/0.85ML AUIJ per week (Patient not taking: Reported on 05/04/2021)     HYDROcodone-acetaminophen (NORCO/VICODIN) 5-325 MG tablet Take 1 tablet by mouth every 6 (six) hours as needed for moderate pain.     LANTUS SOLOSTAR 100 UNIT/ML Solostar Pen Inject  37 Units into the skin at bedtime. (Patient not taking: Reported on 05/04/2021) 15 mL 0   No current facility-administered medications for this visit.     Review of Systems    Chest pain over the course of the weekend prompting ED evaluation.  Recurrence of that pain this morning.  He denies dyspnea, palpitations, PND, orthopnea, dizziness, syncope, edema, or early satiety.  All other systems reviewed and are otherwise negative except as noted above.    Physical Exam    VS:  BP 110/70 (BP Location: Left Arm, Patient Position: Sitting, Cuff Size: Normal)    Pulse 80    Ht 5\' 11"  (1.803 m)    Wt 230 lb (104.3 kg)    SpO2 94%    BMI 32.08 kg/m  , BMI Body mass index is 32.08 kg/m.     GEN: Well nourished, well developed, in no acute distress. HEENT: normal. Neck: Supple, no JVD, carotid bruits, or masses. Cardiac: RRR, no murmurs, rubs, or gallops. No clubbing, cyanosis, edema.  Radials/PT 2+ and equal bilaterally.  Respiratory:  Respirations regular and unlabored, clear to auscultation bilaterally. GI: Soft, nontender, nondistended, BS + x 4. MS: no deformity or atrophy. Skin: warm and dry, no rash. Neuro:  Strength and sensation are intact. Psych: Normal affect.  Accessory Clinical  Findings    ECG personally reviewed by me today -regular sinus rhythm, 80, first-degree AV block, left axis deviation, prior anteroseptal infarct- no acute changes.  Lab Results  Component Value Date   WBC 5.4 05/02/2021   HGB 14.0 05/02/2021   HCT 42.7 05/02/2021   MCV 88.0 05/02/2021   PLT 243 05/02/2021   Lab Results  Component Value Date   CREATININE 1.42 (H) 05/02/2021   BUN 19 05/02/2021   NA 135 05/02/2021   K 4.2 05/02/2021   CL 104 05/02/2021   CO2 25 05/02/2021   Lab Results  Component Value Date   ALT 18 05/02/2021   AST 21 05/02/2021   ALKPHOS 59 05/02/2021   BILITOT 0.7 05/02/2021   Lab Results  Component Value Date   CHOL 90 11/03/2020   HDL 32 (L) 11/03/2020   LDLCALC 36 11/03/2020   TRIG 141 11/03/2020   CHOLHDL 2.8 11/03/2020    Lab Results  Component Value Date   HGBA1C 10.4 (A) 02/16/2021    Assessment & Plan    1.  Precordial chest pain/CAD: Patient with prior history of non-STEMI in October 2021 with PCI and drug-eluting stent placement to the RCA.  He has had some degree of intermittent and often fleeting chest discomfort since then, necessitating titration of beta-blocker and subsequently addition of low-dose isosorbide therapy in December.  He seemed to have some improvement in his symptoms following addition of nitrate however, on February 4, he began experiencing chest discomfort that persisted throughout the day into the next day, prompting presentation to the emergency department, where troponins were normal, D-dimer was elevated, however, CT of the chest was negative for PE or other acute findings.  Symptoms seem to improve after GI cocktail and morphine.  He is not sure which one might of helped.  He felt well yesterday but had recurrent chest pain symptoms this morning.  Pain is currently ongoing at about 4/10 and is not reproducible with palpation, deep breathing, or position changes.  ECG is without acute changes and patient is  hemodynamically stable.  Given prolonged duration of his chest pain over the weekend with normal troponins, I suspect his pain  is noncardiac.  He notes that discomfort did worsen with lying down and therefore, must question GI etiology.  Given his history of CAD, I will obtain a Lexiscan Myoview to rule out ischemia and hopefully provide reassurance.  I have also asked him to increase his Prilosec to 20 mg twice daily.  He otherwise remains on aspirin, beta-blocker, Plavix, Zetia, ACE inhibitor, and nitrate therapy.  2.  GERD: As outlined above, some of his symptoms described this weekend are GERD and that symptoms seem to move up his chest when lying down.  Increasing PPI to twice daily.  3.  Essential hypertension: Stable continue current regimen.  4.  Hyperlipidemia: LDL of 36 in August with normal LFTs last week.  Continue Zetia (intolerant to statins).  5.  Stage III chronic kidney disease: Creatinine stable at 1.42 at ED visit February 5.  Continue ACE inhibitor therapy.  6.  Type 2 diabetes mellitus: A1c 10.4 in November.  Followed closely by primary care.  7.  History of PE: Previously on anticoagulation.  Elevated D-dimer with normal CTA at recent ED eval.  8.  Disposition: Follow-up Lexiscan Myoview.  Follow-up in clinic in 4 to 6 weeks or sooner if necessary.  Murray Hodgkins, NP 05/04/2021, 6:20 PM

## 2021-05-04 NOTE — Patient Instructions (Addendum)
Medication Instructions:  Your physician has recommended you make the following change in your medication:  1.Increase prilosec to 20 mg, take one tablet by mouth twice a day *If you need a refill on your cardiac medications before your next appointment, please call your pharmacy*   Lab Work: None  If you have labs (blood work) drawn today and your tests are completely normal, you will receive your results only by: [place] (if you have MyChart) OR A paper copy in the mail If you have any lab test that is abnormal or we need to change your treatment, we will call you to review the results.   Testing/Procedures: - Your physician has requested that you have a lexiscan myoview.   [place]  Your caregiver has ordered a Stress Test with nuclear imaging. The purpose of this test is to evaluate the blood supply to your heart muscle. This procedure is referred to as a "Non-Invasive Stress Test." This is because other than having an IV started in your vein, nothing is inserted or "invades" your body. Cardiac stress tests are done to find areas of poor blood flow to the heart by determining the extent of coronary artery disease (CAD). Some patients exercise on a treadmill, which naturally increases the blood flow to your heart, while others who are  unable to walk on a treadmill due to physical limitations have a pharmacologic/chemical stress agent called Lexiscan . This medicine will mimic walking on a treadmill by temporarily increasing your coronary blood flow.   Please note: these test may take anywhere between 2-4 hours to complete  PLEASE REPORT TO [facility] AT THE FIRST DESK WILL DIRECT YOU WHERE TO GO  Date of Procedure:_____________________________________  Arrival Time for Procedure:______________________________  Instructions regarding medication:   __x__ : Hold diabetes medication morning of procedure (Hold all oral and injectible  diabetic meds morning of)  __x___: You may take all of your other morning medications as prescribed    PLEASE NOTIFY THE OFFICE AT LEAST 24 HOURS IN ADVANCE IF YOU ARE UNABLE TO KEEP YOUR APPOINTMENT.  [phone] AND  PLEASE NOTIFY NUCLEAR MEDICINE AT [facility] AT LEAST 24 HOURS IN ADVANCE IF YOU ARE UNABLE TO KEEP YOUR APPOINTMENT. [phone]  How to prepare for your Myoview test:  Do not eat or drink after midnight No caffeine for 24 hours prior to test No smoking 24 hours prior to test. Your medication may be taken with water.  If your doctor stopped a medication because of this test, do not take that medication. Please wear a short sleeve shirt. No perfume, cologne or lotion. Wear comfortable walking shoes. No heels!  Follow-Up: At [facility], you and your health needs are our priority.  As part of our continuing mission to provide you with exceptional heart care, we have created designated Provider Care Teams.  These Care Teams include your primary Cardiologist (physician) and Advanced Practice Providers (APPs -  Physician Assistants and Nurse Practitioners) who all work together to provide you with the care you need, when you need it.   Your next appointment:   3-4week(s)  The format for your next appointment:   In Person  Provider:   [name], MD or Murray Hodgkins, NP{

## 2021-05-04 NOTE — Progress Notes (Signed)
[facility] Customer service manager [facility])                                            [facility] Quality Pharmacy Team    05/04/2021  [name] 06/29/1950 [id number]  Care coordination call placed to [employer] in regard to [employer] application.  Spoke to [name] who informs patient is APPROVED 05/03/21-03/27/22. She informs medication will auto fill and ship to provider's office based on last fill date in 2022 and going forward in 2023.  [name] P. [name], CPhT [facility] [employer]  [phone]

## 2021-05-04 NOTE — Addendum Note (Signed)
Addended by: [name] on: 05/04/2021 08:48 AM   Modules accepted: Orders

## 2021-05-07 ENCOUNTER — Ambulatory Visit (INDEPENDENT_AMBULATORY_CARE_PROVIDER_SITE_OTHER): Payer: Medicare HMO | Admitting: Pharmacist

## 2021-05-07 ENCOUNTER — Other Ambulatory Visit: Payer: Self-pay | Admitting: Family Medicine

## 2021-05-07 DIAGNOSIS — I129 Hypertensive chronic kidney disease with stage 1 through stage 4 chronic kidney disease, or unspecified chronic kidney disease: Secondary | ICD-10-CM

## 2021-05-07 DIAGNOSIS — K219 Gastro-esophageal reflux disease without esophagitis: Secondary | ICD-10-CM

## 2021-05-07 DIAGNOSIS — Z794 Long term (current) use of insulin: Secondary | ICD-10-CM

## 2021-05-07 DIAGNOSIS — E1169 Type 2 diabetes mellitus with other specified complication: Secondary | ICD-10-CM

## 2021-05-07 DIAGNOSIS — N183 Chronic kidney disease, stage 3 unspecified: Secondary | ICD-10-CM

## 2021-05-07 MED ORDER — PANTOPRAZOLE SODIUM 20 MG PO TBEC: 20.0000 mg | DELAYED_RELEASE_TABLET | Freq: Two times a day (BID) | ORAL | 5 refills | Status: DC

## 2021-05-07 NOTE — Patient Instructions (Signed)
Visit Information  Thank you for taking time to visit with me today. Please don't hesitate to contact me if I can be of assistance to you before our next scheduled telephone appointment.  Following are the goals we discussed today:   Goals Addressed             This Visit's Progress    Pharmacy - Patient Goals       Our goal A1c is less than 7%. This corresponds with fasting sugars less than 130 and 2 hour after meal sugars less than 180. Please check your fasting blood sugar each morning and keep a record of the results  Please check your blood pressure and keep record of the results  Feel free to call me with any questions or concerns. I look forward to our next call   Estelle Grumbles, PharmD, BCACP Clinical Pharmacist [facility] [phone]           Our next appointment is by telephone on 3/20 at 8:30 am  Please call the care guide team at [phone] if you need to cancel or reschedule your appointment.   Patient verbalizes understanding of instructions and care plan provided today and agrees to view in MyChart. Active MyChart status confirmed with patient.

## 2021-05-07 NOTE — Chronic Care Management (AMB) (Signed)
Chronic Care Management CCM Pharmacy Note  05/07/2021 Name:  [name] MRN:  [id number] DOB:  [birth year]   Subjective: [name] is an 71 y.o. year old male who is a primary patient of [name], DO.  The CCM team was consulted for assistance with disease management and care coordination needs.    Engaged with patient by telephone for follow up visit for pharmacy case management and/or care coordination services.   Objective:  Medications Reviewed Today     Reviewed by Manuela Neptune, RPH-CPP (Pharmacist) on 05/07/21 at [phone]  Med List Status: <None>   Medication Order Taking? Sig Documenting Provider Last Dose Status Informant  ACCU-CHEK SMARTVIEW test strip [id number]  TEST BLOOD SUGAR TWICE DAILY [name], DO  Active   acetaminophen (TYLENOL) 500 MG tablet [id number]  Take 1,000 mg by mouth every 8 (eight) hours as needed.  [provider]  Active Spouse/Significant Other  allopurinol (ZYLOPRIM) 100 MG tablet [id number]  TAKE 1 TABLET AT BEDTIME [name], DO  Active   aspirin EC 81 MG EC tablet [id number]  Take 1 tablet (81 mg total) by mouth daily. Swallow whole. Alford Highland, MD  Active   calcium carbonate (TUMS - DOSED IN MG ELEMENTAL CALCIUM) 500 MG chewable tablet [id number]  Chew 2 tablets by mouth at bedtime as needed for indigestion or heartburn. [provider]  Active   carvedilol (COREG) 12.5 MG tablet [id number] Yes TAKE 1 TABLET TWICE DAILY Karamalegos, Netta Neat, DO Taking Active   clopidogrel (PLAVIX) 75 MG tablet [id number]  Take 1 tablet (75 mg total) by mouth daily. Debbe Odea, MD  Active   DROPLET PEN NEEDLES 32G X 4 MM MISC [id number]  USE WITH [name] INSULIN INJECTION DAILY AS INSTRUCTED [name], DO  Active   ezetimibe (ZETIA) 10 MG tablet [id number] Yes TAKE 1 TABLET EVERY DAY Karamalegos, Alexander Shela Commons, DO Taking Active   fluticasone (FLONASE) 50 MCG/ACT  nasal spray [id number]  USE 2 SPRAYS IN EACH NOSTRIL ONCE DAILY USE FOR 4 TO 6 WEEKS, THEN STOP AND USE SEASONALLY OR AS NEEDED. [name], DO  Active   HYDROcodone-acetaminophen (NORCO/VICODIN) 5-325 MG tablet [id number]  Take 1 tablet by mouth every 6 (six) hours as needed for moderate pain. [provider]  Active   Insulin Glargine [facility]) [id number] Yes Inject 38 Units into the skin. [provider] Taking Active   isosorbide mononitrate (IMDUR) 30 MG 24 hr tablet [id number]  Take 0.5 tablets (15 mg total) by mouth daily. Debbe Odea, MD  Active   lisinopril (ZESTRIL) 40 MG tablet [id number] Yes TAKE 1 TABLET EVERY DAY [name], DO Taking Active   MITIGARE 0.6 MG CAPS [id number]  Take for gout flare start Day 1 with 2 tablets then may take 1 extra tablet in 1 hour. Starting Day 2 take 1 tablet daily for up to 7-10 days until resolve. May increase to 1 tablet twice a day max dose if needed. Karamalegos, Netta Neat, DO  Active   nitroGLYCERIN (NITROSTAT) 0.4 MG SL tablet [id number]  Place 1 tablet (0.4 mg total) under the tongue every 5 (five) minutes as needed for chest pain. Alford Highland, MD  Active   omeprazole (PRILOSEC) 20 MG capsule [id number] Yes Take 1 capsule (20 mg total) by mouth 2 (two) times daily. Creig Hines, NP Taking Active   Semaglutide [facility]) 7 MG TABS [id number] Yes 14 mg. Take 1 tablet (14 mg)  by mouth daily on an empty stomach, at least 30 minutes before the first food, beverage, or other oral medications of the day with 4 oz of plain water only [provider] Taking Active             Pertinent Labs:  Lab Results  Component Value Date   HGBA1C 10.4 (A) 02/16/2021   Lab Results  Component Value Date   CHOL 90 11/03/2020   HDL 32 (L) 11/03/2020   LDLCALC 36 11/03/2020   TRIG 141 11/03/2020   CHOLHDL 2.8 11/03/2020   Lab Results  Component Value Date   CREATININE 1.42  (H) 05/02/2021   BUN 19 05/02/2021   NA 135 05/02/2021   K 4.2 05/02/2021   CL 104 05/02/2021   CO2 25 05/02/2021   BP Readings from Last 3 Encounters:  05/04/21 110/70  05/02/21 (!) 146/90  04/14/21 (!) 155/82   Pulse Readings from Last 3 Encounters:  05/04/21 80  05/02/21 67  04/14/21 69     SDOH:  (Social Determinants of Health) assessments and interventions performed:    CCM Care Plan  Review of patient past medical history, allergies, medications, health status, including review of consultants reports, laboratory and other test data, was performed as part of comprehensive evaluation and provision of chronic care management services.   Care Plan : PharmD - Medication Management  Updates made by Manuela Neptune, RPH-CPP since 05/07/2021 12:00 AM     Problem: Disease Progression      Long-Range Goal: Disease Progression Prevented or Minimized   Start Date: 04/22/2020  Expected End Date: 07/21/2020  This Visit's Progress: On track  Recent Progress: On track  Priority: High  Note:   Current Barriers:  Financial Barriers - patient has [employer] and reports copay for Bydureon & insulin glargine is cost prohibitive at this time Patient APPROVED for patient assistance for [name] from [name] through through 03/27/2022 Patient APPROVED to receive Rybelsus from [employer] patient assistance program through 03/27/22 Lack of blood sugar or blood pressure readings for clinical team  Pharmacist Clinical Goal(s):  Over the next 90 days, patient will achieve adherence to monitoring guidelines and medication adherence to achieve therapeutic efficacy through collaboration with PharmD and provider.   Interventions: 1:1 collaboration with [name], DO regarding development and update of comprehensive plan of care as evidenced by provider attestation and co-signature Inter-disciplinary care team collaboration (see longitudinal plan of  care) Perform chart review Patient seen in [facility] ED on 2/5 for chest pain Office Visit with [facility] on 2/7. Provider advised patient: Order placed for Lexiscan Myoview To increase his Prilosec to 20 mg twice daily Note patient scheduled with Radiology on 2/14 Today reports chest discomfort has improved since increased Prilosec. States has noticed symptoms to correlate with overeating Note risk of drug-drug interaction for clopidogrel and omeprazole, since patient started on clopidogrel on 03/23/2021 Concurrent use with omeprazole may result in decreased clopidogrel effectiveness Recommend switch from omeprazole to pantoprazole to reduce risk of interaction with clopidogrel Will collaborate with PCP  Type 2 Diabetes: Uncontrolled; current treatment: Rybelsus 7 mg - 2 tablets (14 mg) daily ?30 minutes before the first food, beverage, or other oral medications of the day with ?4 oz of plain water  Reports tolerating well Insulin glargine ([name]/Lantus) 38 units QHS Previous therapies tried: Trulicity (GI intolerance) Metformin (unable to tolerate - GI side effects) Bydureon (difficulty with device/administration) Invokana (felt dehydrated) Reports recent home blood sugar  readings:   Morning fasting Notes  3 - February 148   4 - February 104   5 - February 128   6 - February 126   7 - February -   8 - February 201* *Overnight snacking  9 - February 145   10 - February 188* *Piece of cake overnight   Attributes elevated blood sugars to overeating at supper and around supper Discuss making positive dietary changes to have well-balanced meals and reduce carbohydrate portion sizes Consistently having carbohydrate + protein snack between lunch and supper to help have smaller portion size at supper Reducing snacking/sweets overnight Have discussed benefits of continuous blood glucose monitor and offered to investigate health plan coverage for CBGM, but  patient declined Encourage patient to use glucometer to check blood sugar 2 hours after meals (breakfast, lunch or supper) a couple of times/week to use as feedback on impact of dietary choices on blood sugar  Exercise: not walking as much since sick with gout and upset stomach Review monitoring for s/s of hypoglycemia   Hyperlipidemia: Controlled; current treatment: Ezetimibe 10 mg daily - reports currently taking every other day Previous therapies tried: atorvastatin 40 mg daily & 10 mg daily (unable to tolerate due to myalgia)   Hypertension: Current treatment: Carvedilol 12.5 mg twice daily Lisinopril 40 mg daily  Imdur 15 mg (1/2 tablet) daily Counsel patient on blood pressure monitoring technique, including resting prior to readings Encourage patient to continue to monitor home blood pressure, keep log of results, have to review at upcoming appointments, but to contact office sooner for readings outside of established parameters   Medication Assistance: Collaborated with [facility] CPhT Noreene Larsson Simcox for assistance to patient with re-enrollment application for [employer] patient assistance for 2023 Patient/spouse confirm recently received supply of Hospital doctor from [place] assistance program From review of notes from [facility] CPhT Jill Simcox on 2/7, advised patient APPROVED 05/03/21-03/27/22 for patient assistance for Rybelsus from [employer] and medication will auto fill and ship to provider's office based on last fill date in 2022 and going forward in 2023 Today report patient has a 12 day supply for Rybelsus remaining Advise patient/spouse to follow up with [employer] patient assistance program today to find out when medication will be shipped Patient/spouse to follow up with CM Pharmacist or office if patient will need Rx called into pharmacy for supply while waiting on medication to come from program  Patient Goals/Self-Care Activities Over the next 90 days, patient will:  - take  medications as directed  Patient uses weekly pillbox to manage medications - check glucose, document, and provide at future appointments - check blood pressure, document, and provide at future appointments - attend medical appointments as scheduled  Follow Up Plan: Telephone follow up appointment with care management team member scheduled for: 3/20 at 8:30 am       Estelle Grumbles, PharmD, [place], CPP Clinical Pharmacist [facility] [phone]

## 2021-05-10 ENCOUNTER — Ambulatory Visit: Payer: Self-pay | Admitting: Pharmacist

## 2021-05-10 DIAGNOSIS — K219 Gastro-esophageal reflux disease without esophagitis: Secondary | ICD-10-CM

## 2021-05-10 DIAGNOSIS — I252 Old myocardial infarction: Secondary | ICD-10-CM

## 2021-05-10 NOTE — Chronic Care Management (AMB) (Signed)
Chronic Care Management CCM Pharmacy Note  05/10/2021 Name:  [name] MRN:  [id number] DOB:  14-Apr-1950   Subjective: [name] is an 71 y.o. year old male who is a primary patient of Daniel Hauser, DO.  The CCM team was consulted for assistance with disease management and care coordination needs.    Engaged with patient's wife by telephone for follow up visit for pharmacy case management and/or care coordination services.   Objective:  Medications    Reviewed by [name], RPH-CPP (Pharmacist) on 05/10/21 at 2238  Med List Status: <None>   Medication Order Taking? Sig Documenting Provider Last Dose Status Informant  ACCU-CHEK SMARTVIEW test strip [id number] No TEST BLOOD SUGAR TWICE DAILY [name], Daniel Doughty, DO Taking Active   acetaminophen (TYLENOL) 500 MG tablet [id number] No Take 1,000 mg by mouth every 8 (eight) hours as needed.  [provider] Taking Active Spouse/Significant Other  allopurinol (ZYLOPRIM) 100 MG tablet [id number] No TAKE 1 TABLET AT BEDTIME Daniel Hauser, DO Taking Active   aspirin EC 81 MG EC tablet [id number] No Take 1 tablet (81 mg total) by mouth daily. Swallow whole. Daniel Grayer, MD Taking Active   calcium carbonate (TUMS - DOSED IN MG ELEMENTAL CALCIUM) 500 MG chewable tablet [id number] No Chew 2 tablets by mouth at bedtime as needed for indigestion or heartburn. [provider] Taking Active   carvedilol (COREG) 12.5 MG tablet [id number] No TAKE 1 TABLET TWICE DAILY [name], Daniel Doughty, DO Taking Active   clopidogrel (PLAVIX) 75 MG tablet [id number] No Take 1 tablet (75 mg total) by mouth daily. Daniel Sable, MD Taking Active   DROPLET PEN NEEDLES 32G X 4 MM MISC [id number] No USE WITH [name] INSULIN INJECTION DAILY AS INSTRUCTED Daniel Hauser, DO Taking Active   ezetimibe (ZETIA) 10 MG tablet [id number] No TAKE 1 TABLET EVERY DAY [name], Daniel Lenna Sciara, DO Taking  Active   fluticasone (FLONASE) 50 MCG/ACT nasal spray [id number] No USE 2 SPRAYS IN EACH NOSTRIL ONCE DAILY USE FOR 4 TO 6 WEEKS, THEN STOP AND USE SEASONALLY OR AS NEEDED. Daniel Hauser, DO Taking Active   HYDROcodone-acetaminophen (NORCO/VICODIN) 5-325 MG tablet [id number] No Take 1 tablet by mouth every 6 (six) hours as needed for moderate pain. [provider] 04/13/2021 Active   Insulin Glargine [facility] [place]) [id number] No Inject 38 Units into the skin. [provider] Taking Active   isosorbide mononitrate (IMDUR) 30 MG 24 hr tablet [id number] No Take 0.5 tablets (15 mg total) by mouth daily. Daniel Sable, MD Taking Active   lisinopril (ZESTRIL) 40 MG tablet [id number] No TAKE 1 TABLET EVERY DAY Daniel Hauser, DO Taking Active   MITIGARE 0.6 MG CAPS [id number] No Take for gout flare start Day 1 with 2 tablets then may take 1 extra tablet in 1 hour. Starting Day 2 take 1 tablet daily for up to 7-10 days until resolve. May increase to 1 tablet twice a day max dose if needed. Daniel Hauser, DO Taking Active   nitroGLYCERIN (NITROSTAT) 0.4 MG SL tablet [id number] No Place 1 tablet (0.4 mg total) under the tongue every 5 (five) minutes as needed for chest pain. Daniel Grayer, MD Taking Active   pantoprazole (PROTONIX) 20 MG tablet [id number]  Take 1 tablet (20 mg total) by mouth 2 (two) times daily before a meal. [name], Daniel Doughty, DO  Active   Semaglutide (RYBELSUS) 7 MG TABS [id number] No 14 mg. Take 1 tablet (14  mg) by mouth daily on an empty stomach, at least 30 minutes before the first food, beverage, or other oral medications of the day with 4 oz of plain water only [provider] Taking Active            SDOH:  (Social Determinants of Health) assessments and interventions performed:    [place]  Review of patient past medical history, allergies, medications, health status, including review of consultants  reports, laboratory and other test data, was performed as part of comprehensive evaluation and provision of chronic care management services.   Care Plan : PharmD - Medication Management  Updates made by [name], RPH-CPP since 05/10/2021 12:00 AM     Problem: Disease Progression      Long-Range Goal: Disease Progression Prevented or Minimized   Start Date: 04/22/2020  Expected End Date: 07/21/2020  Recent Progress: On track  Priority: High  Note:   Current Barriers:  Financial Barriers - patient has [employer] and reports copay for Bydureon & insulin glargine is cost prohibitive at this time Patient APPROVED for patient assistance for [name] from [place] through through 03/27/2022 Patient APPROVED to receive Rybelsus from [employer] patient assistance program through 03/27/22 Lack of blood sugar or blood pressure readings for clinical team  Pharmacist Clinical Goal(s):  Over the next 90 days, patient will achieve adherence to monitoring guidelines and medication adherence to achieve therapeutic efficacy through collaboration with PharmD and provider.   Interventions: 1:1 collaboration with Daniel Hauser, DO regarding development and update of comprehensive plan of care as evidenced by provider attestation and co-signature Inter-disciplinary care team collaboration (see longitudinal plan of care) Collaborated with PCP regarding risk of drug-drug interaction for clopidogrel and omeprazole, since patient started on clopidogrel on 03/23/2021 Concurrent use with omeprazole may result in decreased clopidogrel effectiveness Recommended switch from omeprazole to pantoprazole to reduce risk of interaction with clopidogrel PCP sent new Rx for pantoprazole to pharmacy on 2/10 Today place follow up call to patient. Reach patient's wife who reports has already picked up pantoprazole Rx and verbalizes understanding of plan for patient to start taking pantoprazole  as directed and patient to stop taking omeprazole.   Patient Goals/Self-Care Activities Over the next 90 days, patient will:  - take medications as directed  Patient uses weekly pillbox to manage medications - check glucose, document, and provide at future appointments - check blood pressure, document, and provide at future appointments - attend medical appointments as scheduled  Follow Up Plan: Telephone follow up appointment with care management team member scheduled for: 3/20 at 8:30 am       Wallace Cullens, PharmD, [place], [place] [phone]

## 2021-05-10 NOTE — Patient Instructions (Signed)
Visit Information  Thank you for taking time to visit with me today. Please don't hesitate to contact me if I can be of assistance to you before our next scheduled telephone appointment.  Following are the goals we discussed today:   Goals Addressed             This Visit's Progress    Pharmacy - Patient Goals       Our goal A1c is less than 7%. This corresponds with fasting sugars less than 130 and 2 hour after meal sugars less than 180. Please check your fasting blood sugar each morning and keep a record of the results  Please check your blood pressure and keep record of the results  Feel free to call me with any questions or concerns. I look forward to our next call  Estelle Grumbles, PharmD, BCACP Clinical Pharmacist [facility] [phone]           Our next appointment is by telephone on 3/20 at 8:30 am  Please call the care guide team at [phone] if you need to cancel or reschedule your appointment.    Patient's spouse verbalizes understanding of instructions and care plan provided today and agrees to view in MyChart. Active MyChart status confirmed with patient.

## 2021-05-11 ENCOUNTER — Ambulatory Visit
Admission: RE | Admit: 2021-05-11 | Discharge: 2021-05-11 | Disposition: A | Discharge status: Home / Self Care | Payer: Medicare HMO | Source: Ambulatory Visit | Attending: Nurse Practitioner | Admitting: Nurse Practitioner

## 2021-05-11 DIAGNOSIS — R072 Precordial pain: Secondary | ICD-10-CM | POA: Insufficient documentation

## 2021-05-11 LAB — NM MYOCAR MULTI W/SPECT W/WALL MOTION / EF
LV dias vol: 113 mL (ref 62–150)
LV sys vol: 37 mL
MPHR: 151 {beats}/min
Nuc Stress EF: 67 %
Peak HR: 67 {beats}/min
Percent HR: 56 %
Rest HR: 68 {beats}/min
Rest Nuclear Isotope Dose: 10.3 mCi
SDS: 6
SRS: 8
SSS: 14
ST Depression (mm): 0 mm
Stress Nuclear Isotope Dose: 31.5 mCi
TID: 1.09

## 2021-05-11 MED ORDER — REGADENOSON 0.4 MG/5ML IV SOLN
0.4000 mg | Freq: Once | INTRAVENOUS | Status: AC
  Administered 2021-05-11: 0.4 mg via INTRAVENOUS

## 2021-05-11 MED ORDER — TECHNETIUM TC 99M TETROFOSMIN IV KIT
30.8000 | PACK | Freq: Once | INTRAVENOUS | Status: AC | PRN
  Administered 2021-05-11: 31.45 via INTRAVENOUS

## 2021-05-11 MED ORDER — TECHNETIUM TC 99M TETROFOSMIN IV KIT
10.3200 | PACK | Freq: Once | INTRAVENOUS | Status: AC | PRN
  Administered 2021-05-11: 10.32 via INTRAVENOUS

## 2021-05-18 ENCOUNTER — Telehealth: Payer: Self-pay | Admitting: Family Medicine

## 2021-05-18 NOTE — Telephone Encounter (Signed)
Caller checking on the status if  Semaglutide (RYBELSUS) 7 MG TABS was delivered. Caller would like a follow up call once delivered

## 2021-05-19 NOTE — Telephone Encounter (Signed)
Nothing has been delivered to the office as of today.

## 2021-05-22 ENCOUNTER — Encounter: Payer: Self-pay | Admitting: Family Medicine

## 2021-05-24 ENCOUNTER — Other Ambulatory Visit: Payer: Self-pay

## 2021-05-24 DIAGNOSIS — K219 Gastro-esophageal reflux disease without esophagitis: Secondary | ICD-10-CM

## 2021-05-24 MED ORDER — PANTOPRAZOLE SODIUM 20 MG PO TBEC: 20.0000 mg | DELAYED_RELEASE_TABLET | Freq: Two times a day (BID) | ORAL | 3 refills | Status: DC

## 2021-05-25 ENCOUNTER — Other Ambulatory Visit: Payer: Self-pay | Admitting: Family Medicine

## 2021-05-25 ENCOUNTER — Other Ambulatory Visit: Payer: Self-pay

## 2021-05-25 ENCOUNTER — Encounter: Payer: Self-pay | Admitting: Family Medicine

## 2021-05-25 DIAGNOSIS — Z794 Long term (current) use of insulin: Secondary | ICD-10-CM

## 2021-05-25 DIAGNOSIS — E1169 Type 2 diabetes mellitus with other specified complication: Secondary | ICD-10-CM

## 2021-05-25 DIAGNOSIS — I252 Old myocardial infarction: Secondary | ICD-10-CM | POA: Diagnosis not present

## 2021-05-25 DIAGNOSIS — N183 Chronic kidney disease, stage 3 unspecified: Secondary | ICD-10-CM | POA: Diagnosis not present

## 2021-05-25 DIAGNOSIS — I129 Hypertensive chronic kidney disease with stage 1 through stage 4 chronic kidney disease, or unspecified chronic kidney disease: Secondary | ICD-10-CM

## 2021-05-25 MED ORDER — RYBELSUS 7 MG PO TABS
14.0000 mg | ORAL_TABLET | Freq: Every day | ORAL | 0 refills | Status: DC
  Filled 2021-05-25: qty 30, fill #0

## 2021-05-26 MED ORDER — RYBELSUS 14 MG PO TABS: 14.0000 mg | ORAL_TABLET | Freq: Every day | ORAL | 0 refills | Status: DC

## 2021-05-27 ENCOUNTER — Other Ambulatory Visit (HOSPITAL_COMMUNITY): Payer: Self-pay

## 2021-05-31 DIAGNOSIS — I10 Essential (primary) hypertension: Secondary | ICD-10-CM | POA: Diagnosis not present

## 2021-05-31 DIAGNOSIS — N1831 Chronic kidney disease, stage 3a: Secondary | ICD-10-CM | POA: Diagnosis not present

## 2021-05-31 DIAGNOSIS — E1122 Type 2 diabetes mellitus with diabetic chronic kidney disease: Secondary | ICD-10-CM | POA: Diagnosis not present

## 2021-06-02 ENCOUNTER — Encounter: Payer: Self-pay | Admitting: Family Medicine

## 2021-06-02 ENCOUNTER — Encounter: Payer: Self-pay | Admitting: Ophthalmology

## 2021-06-03 ENCOUNTER — Encounter: Payer: Self-pay | Admitting: Cardiology

## 2021-06-03 ENCOUNTER — Ambulatory Visit: Payer: Medicare HMO | Admitting: Cardiology

## 2021-06-03 ENCOUNTER — Other Ambulatory Visit: Payer: Self-pay

## 2021-06-03 VITALS — BP 122/72 | HR 74 | Ht 71.0 in | Wt 233.0 lb

## 2021-06-03 DIAGNOSIS — I2510 Atherosclerotic heart disease of native coronary artery without angina pectoris: Secondary | ICD-10-CM

## 2021-06-03 DIAGNOSIS — Z720 Tobacco use: Secondary | ICD-10-CM | POA: Diagnosis not present

## 2021-06-03 DIAGNOSIS — E7800 Pure hypercholesterolemia, unspecified: Secondary | ICD-10-CM | POA: Diagnosis not present

## 2021-06-03 DIAGNOSIS — I10 Essential (primary) hypertension: Secondary | ICD-10-CM | POA: Diagnosis not present

## 2021-06-03 NOTE — Patient Instructions (Signed)
Medication Instructions:  ?Your physician recommends that you continue on your current medications as directed. Please refer to the Current Medication list given to you today. ? ?*If you need a refill on your cardiac medications before your next appointment, please call your pharmacy* ? ? ?Lab Work: ?None ordered ?If you have labs (blood work) drawn today and your tests are completely normal, you will receive your results only by: ?MyChart Message (if you have MyChart) OR ?A paper copy in the mail ?If you have any lab test that is abnormal or we need to change your treatment, we will call you to review the results. ? ? ?Testing/Procedures: ?None ordered ? ? ?Follow-Up: ?At [facility], you and your health needs are our priority.  As part of our continuing mission to provide you with exceptional heart care, we have created designated Provider Care Teams.  These Care Teams include your primary Cardiologist (physician) and Advanced Practice Providers (APPs -  Physician Assistants and Nurse Practitioners) who all work together to provide you with the care you need, when you need it. ? ?We recommend signing up for the patient portal called "MyChart".  Sign up information is provided on this After Visit Summary.  MyChart is used to connect with patients for Virtual Visits (Telemedicine).  Patients are able to view lab/test results, encounter notes, upcoming appointments, etc.  Non-urgent messages can be sent to your provider as well.   ?To learn more about what you can do with MyChart, go to [email or web address].   ? ?Your next appointment:   ?Your physician wants you to follow-up in: 1 year You will receive a reminder letter in the mail two months in advance. If you don't receive a letter, please call our office to schedule the follow-up appointment. ? ? ?The format for your next appointment:   ?In Person ? ?Provider:   ?You may see Brian Agbor-Etang, MD or one of the following Advanced Practice Providers on your  designated Care Team:   ?Christopher Berge, NP ?Ryan Dunn, PA-C ?Cadence Furth, PA-C{ ? ? ? ?Other Instructions ?N/A ? ?

## 2021-06-03 NOTE — Progress Notes (Signed)
Cardiology Office Note:    Date:  06/03/2021   ID:  [name], DOB 07-02-1950, MRN [id number]  PCP:  [name], DO  Cardiologist:  [name], MD  Electrophysiologist:  None   Referring MD: Nobie Putnam *   Chief Complaint  Patient presents with   Other    3-4 week follow up post Stress test. Meds reviewed verbally with patient.      History of Present Illness:    [name] is a 71 y.o. male with a hx of CAD/NSTEMI (PCI/DES to RCA 12/2019), hypertension, hyperlipidemia, CKD-3, GERD, PE treated with Eliquis, chews tobacco who presents for follow-up.    Patient being seen for CAD, chest discomfort.  Imdur started for antianginal benefit, Lexiscan Myoview obtained due to symptoms of chest pain.  He states feeling well, denies further episodes of chest pain.  Able to mow his lawn yesterday without any adverse effects.  Brilinta was previously stopped due to easy bruisability, Plavix started.  Bruising is also improved with stopping Brilinta.  Blood pressure is well controlled.  He states doing well and feels well.   Prior notes Lexiscan Myoview 05/11/2021 fixed apical defect, no evidence for ischemia.  Low risk study Echocardiogram 12/2019, normal ejection fraction 60 to 65%. Left heart cath 12/30/2019 proximal RCA 90%, proximal LAD 40%, moderate left circumflex disease.  Status post successful angioplasty and drug-eluting stent placement to proximal RCA.  Past Medical History:  Diagnosis Date   Allergy    CAD (coronary artery disease)    a. 01/2019 MV: EF 55-65%, no ischemia; b. 12/2019 NSTEMI/PCI: LM nl, LAD 40p, 72m, D1 40, LCX nl, OM3 40, RCA 90/85p (2.75x34 Resolute Onyx DES), 28m/d.   Chronic headaches    CKD (chronic kidney disease), stage III (HCC)    Edentulous    no lower teeth   GERD (gastroesophageal reflux disease)    Gout    Heart burn    History of echocardiogram    a. 12/2019 Echo: EF 60-65%, no rwma, triv MR.    Hyperlipidemia    Hypertension    Motion sickness    boats   NSTEMI (non-ST elevated myocardial infarction) ([place]) 12/27/2019   Pulmonary embolism ([employer]) 05/2017   Type 2 diabetes mellitus [facility])     Past Surgical History:  Procedure Laterality Date   BACK SURGERY  02/26/2019   CATARACT EXTRACTION W/PHACO Left 04/14/2021   Procedure: CATARACT EXTRACTION PHACO AND INTRAOCULAR LENS PLACEMENT ([place]) LEFT DIABETIC maylugin;  Surgeon: Leandrew Koyanagi, MD;  Location: [place];  Service: Ophthalmology;  Laterality: Left;  11.22 1:25.1   KNEE SURGERY     LEFT HEART CATH AND CORONARY ANGIOGRAPHY N/A 12/30/2019   Procedure: LEFT HEART CATH AND CORONARY ANGIOGRAPHY;  Surgeon: Wellington Hampshire, MD;  Location: [place] [facility];  Service: Cardiovascular;  Laterality: N/A;   SHOULDER SURGERY     TONSILLECTOMY      Current Medications: Current Meds  Medication Sig   ACCU-CHEK SMARTVIEW test strip TEST BLOOD SUGAR TWICE DAILY   acetaminophen (TYLENOL) 500 MG tablet Take 1,000 mg by mouth every 8 (eight) hours as needed.    allopurinol (ZYLOPRIM) 100 MG tablet TAKE 1 TABLET AT BEDTIME   aspirin EC 81 MG EC tablet Take 1 tablet (81 mg total) by mouth daily. Swallow whole.   calcium carbonate (TUMS - DOSED IN MG ELEMENTAL CALCIUM) 500 MG chewable tablet Chew 2 tablets by mouth at bedtime as needed for indigestion or heartburn.  carvedilol (COREG) 12.5 MG tablet TAKE 1 TABLET TWICE DAILY   clopidogrel (PLAVIX) 75 MG tablet Take 1 tablet (75 mg total) by mouth daily.   DROPLET PEN NEEDLES 32G X 4 MM MISC USE WITH [name] INSULIN INJECTION DAILY AS INSTRUCTED   ezetimibe (ZETIA) 10 MG tablet TAKE 1 TABLET EVERY DAY   fluticasone (FLONASE) 50 MCG/ACT nasal spray USE 2 SPRAYS IN EACH NOSTRIL ONCE DAILY USE FOR 4 TO 6 WEEKS, THEN STOP AND USE SEASONALLY OR AS NEEDED.   Insulin Glargine ([name] KWIKPEN Kathleen) Inject 38 Units into the skin.   isosorbide mononitrate (IMDUR) 30 MG 24 hr tablet  Take 0.5 tablets (15 mg total) by mouth daily.   lisinopril (ZESTRIL) 40 MG tablet TAKE 1 TABLET EVERY DAY   MITIGARE 0.6 MG CAPS Take for gout flare start Day 1 with 2 tablets then may take 1 extra tablet in 1 hour. Starting Day 2 take 1 tablet daily for up to 7-10 days until resolve. May increase to 1 tablet twice a day max dose if needed.   nitroGLYCERIN (NITROSTAT) 0.4 MG SL tablet Place 1 tablet (0.4 mg total) under the tongue every 5 (five) minutes as needed for chest pain.   pantoprazole (PROTONIX) 20 MG tablet Take 1 tablet (20 mg total) by mouth 2 (two) times daily before a meal.   RYBELSUS 14 MG TABS Take 1 tablet (14 mg total) by mouth daily before breakfast.     Allergies:   Crestor [rosuvastatin calcium], Invokana [canagliflozin], Prednisone, and Sulfamethizole   Social History   Socioeconomic History   Marital status: Married    Spouse name: Not on file   Number of children: Not on file   Years of education: Not on file   Highest education level: Not on file  Occupational History   Occupation: retired  Tobacco Use   Smoking status: Former    Types: Cigars    Quit date: 2000    Years since quitting: 23.2   Smokeless tobacco: Current    Types: Chew   Tobacco comments:    wife states patient never smoked cigarettes, just chewing tobacco  Vaping Use   Vaping Use: Never used  Substance and Sexual Activity   Alcohol use: Not Currently    Alcohol/week: 0.0 standard drinks   Drug use: No   Sexual activity: Yes    Birth control/protection: Inserts  Other Topics Concern   Not on file  Social History Narrative   Not on file   Social Determinants of Health   Financial Resource Strain: Low Risk    Difficulty of Paying Living Expenses: Not hard at all  Food Insecurity: No Food Insecurity   Worried About Charity fundraiser in the Last Year: Never true   Ran Out of Food in the Last Year: Never true  Transportation Needs: No Transportation Needs   Lack of  Transportation (Medical): No   Lack of Transportation (Non-Medical): No  Physical Activity: Inactive   Days of Exercise per Week: 0 days   Minutes of Exercise per Session: 0 min  Stress: No Stress Concern Present   Feeling of Stress : Not at all  Social Connections: Not on file     Family History: The patient's family history includes Cancer in his father; Heart disease in his brother and sister. There is no history of Prostate cancer.  ROS:   Please see the history of present illness.     All other systems reviewed and are negative.  EKGs/Labs/Other Studies Reviewed:    The following studies were reviewed today:   EKG:  EKG not ordered today.    Recent Labs: 11/03/2020: TSH 3.16 05/02/2021: ALT 18; BUN 19; Creatinine, Ser 1.42; Hemoglobin 14.0; Platelets 243; Potassium 4.2; Sodium 135  Recent Lipid Panel    Component Value Date/Time   CHOL 90 11/03/2020 0813   CHOL 212 (H) 11/12/2019 0829   TRIG 141 11/03/2020 0813   HDL 32 (L) 11/03/2020 0813   HDL 32 (L) 11/12/2019 0829   CHOLHDL 2.8 11/03/2020 0813   VLDL 37 12/29/2019 0026   LDLCALC 36 11/03/2020 0813    Physical Exam:    VS:  BP 122/72 (BP Location: Left Arm, Patient Position: Sitting, Cuff Size: Large)    Pulse 74    Ht 5\' 11"  (1.803 m)    Wt 233 lb (105.7 kg)    SpO2 97%    BMI 32.50 kg/m     Wt Readings from Last 3 Encounters:  06/03/21 233 lb (105.7 kg)  05/04/21 230 lb (104.3 kg)  05/02/21 225 lb (102.1 kg)     GEN:  Well nourished, well developed in no acute distress HEENT: Normal NECK: No JVD; No carotid bruits LYMPHATICS: No lymphadenopathy CARDIAC: RRR, no murmurs, rubs, gallops RESPIRATORY:  Clear to auscultation without rales, wheezing or rhonchi  ABDOMEN: Soft, non-tender, non-distended MUSCULOSKELETAL:  No edema; No deformity  SKIN: Warm and dry NEUROLOGIC:  Alert and oriented x 3 PSYCHIATRIC:  Normal affect   ASSESSMENT:    1. Coronary artery disease involving native coronary artery of  native heart, unspecified whether angina present   2. Primary hypertension   3. Pure hypercholesterolemia   4. Tobacco chew use      PLAN:    History of CAD/NSTEMI status post PCI to RCA (12/30/2019).  Denies chest pain.  Easy bruisability with Brilinta, improved with Plavix.  Continue aspirin 81 mg, Plavix. Imdur 15 mg daily.  [place] with no ischemia.  Last echo with EF 60%. History of hypertension, BP controlled.  Continue Coreg, lisinopril, Imdur hyperlipidemia, LDL at goal, continue Zetia.  History of myalgias with statins. Patient chews tobacco.  Cessation advised  Follow-up in 12 months  Total encounter time 40 minutes  Greater than 50% was spent in counseling and coordination of care with the patient   Medication Adjustments/Labs and Tests Ordered: Current medicines are reviewed at length with the patient today.  Concerns regarding medicines are outlined above.  No orders of the defined types were placed in this encounter.   No orders of the defined types were placed in this encounter.    Patient Instructions  Medication Instructions:  Your physician recommends that you continue on your current medications as directed. Please refer to the Current Medication list given to you today.   *If you need a refill on your cardiac medications before your next appointment, please call your pharmacy*   Lab Work: None ordered  If you have labs (blood work) drawn today and your tests are completely normal, you will receive your results only by: [place] (if you have MyChart) OR A paper copy in the mail If you have any lab test that is abnormal or we need to change your treatment, we will call you to review the results.   Testing/Procedures: None ordered   Follow-Up: At [facility], you and your health needs are our priority.  As part of our continuing mission to provide you with exceptional heart care, we have created  designated Provider Care Teams.   These Care Teams include your primary Cardiologist (physician) and Advanced Practice Providers (APPs -  Physician Assistants and Nurse Practitioners) who all work together to provide you with the care you need, when you need it.  We recommend signing up for the patient portal called "MyChart".  Sign up information is provided on this After Visit Summary.  MyChart is used to connect with patients for Virtual Visits (Telemedicine).  Patients are able to view lab/test results, encounter notes, upcoming appointments, etc.  Non-urgent messages can be sent to your provider as well.   To learn more about what you can do with MyChart, go to [email or web address].    Your next appointment:   Your physician wants you to follow-up in: 1 year. You will receive a reminder letter in the mail two months in advance. If you don't receive a letter, please call our office to schedule the follow-up appointment.   The format for your next appointment:   In Person  Provider:   You may see [name], MD or one of the following Advanced Practice Providers on your designated Care Team:   Murray Hodgkins, NP Christell Faith, PA-C Cadence Kathlen Mody, [place]   Other Instructions N/A   Signed, [name], MD  06/03/2021 1:24 PM    [place]

## 2021-06-07 ENCOUNTER — Encounter: Payer: Self-pay | Admitting: Family Medicine

## 2021-06-07 DIAGNOSIS — E1122 Type 2 diabetes mellitus with diabetic chronic kidney disease: Secondary | ICD-10-CM | POA: Diagnosis not present

## 2021-06-07 DIAGNOSIS — I10 Essential (primary) hypertension: Secondary | ICD-10-CM | POA: Diagnosis not present

## 2021-06-07 DIAGNOSIS — N1831 Chronic kidney disease, stage 3a: Secondary | ICD-10-CM | POA: Diagnosis not present

## 2021-06-07 NOTE — Discharge Instructions (Signed)

## 2021-06-09 ENCOUNTER — Encounter: Payer: Self-pay | Admitting: Ophthalmology

## 2021-06-09 ENCOUNTER — Other Ambulatory Visit: Payer: Self-pay | Admitting: Family Medicine

## 2021-06-09 ENCOUNTER — Encounter
Admission: RE | Disposition: A | Discharge status: Home / Self Care | Payer: Self-pay | Source: Home / Self Care | Attending: Ophthalmology

## 2021-06-09 ENCOUNTER — Ambulatory Visit
Admission: RE | Admit: 2021-06-09 | Discharge: 2021-06-09 | Disposition: A | Discharge status: Home / Self Care | Payer: Medicare HMO | Attending: Ophthalmology | Admitting: Ophthalmology

## 2021-06-09 ENCOUNTER — Ambulatory Visit: Payer: Medicare HMO | Admitting: Anesthesiology

## 2021-06-09 ENCOUNTER — Other Ambulatory Visit: Payer: Self-pay

## 2021-06-09 DIAGNOSIS — Z86711 Personal history of pulmonary embolism: Secondary | ICD-10-CM | POA: Diagnosis not present

## 2021-06-09 DIAGNOSIS — Z7984 Long term (current) use of oral hypoglycemic drugs: Secondary | ICD-10-CM | POA: Diagnosis not present

## 2021-06-09 DIAGNOSIS — E1169 Type 2 diabetes mellitus with other specified complication: Secondary | ICD-10-CM

## 2021-06-09 DIAGNOSIS — Z7901 Long term (current) use of anticoagulants: Secondary | ICD-10-CM | POA: Diagnosis not present

## 2021-06-09 DIAGNOSIS — K219 Gastro-esophageal reflux disease without esophagitis: Secondary | ICD-10-CM | POA: Insufficient documentation

## 2021-06-09 DIAGNOSIS — H2511 Age-related nuclear cataract, right eye: Secondary | ICD-10-CM | POA: Diagnosis not present

## 2021-06-09 DIAGNOSIS — E1136 Type 2 diabetes mellitus with diabetic cataract: Secondary | ICD-10-CM | POA: Insufficient documentation

## 2021-06-09 DIAGNOSIS — N189 Chronic kidney disease, unspecified: Secondary | ICD-10-CM | POA: Diagnosis not present

## 2021-06-09 DIAGNOSIS — Z87891 Personal history of nicotine dependence: Secondary | ICD-10-CM | POA: Diagnosis not present

## 2021-06-09 DIAGNOSIS — I129 Hypertensive chronic kidney disease with stage 1 through stage 4 chronic kidney disease, or unspecified chronic kidney disease: Secondary | ICD-10-CM | POA: Insufficient documentation

## 2021-06-09 DIAGNOSIS — Z794 Long term (current) use of insulin: Secondary | ICD-10-CM | POA: Diagnosis not present

## 2021-06-09 DIAGNOSIS — H25811 Combined forms of age-related cataract, right eye: Secondary | ICD-10-CM | POA: Diagnosis not present

## 2021-06-09 HISTORY — PX: CATARACT EXTRACTION W/PHACO: SHX586

## 2021-06-09 LAB — GLUCOSE, CAPILLARY
Glucose-Capillary: 170 mg/dL — ABNORMAL HIGH (ref 70–99)
Glucose-Capillary: 145 mg/dL — ABNORMAL HIGH (ref 70–99)

## 2021-06-09 SURGERY — PHACOEMULSIFICATION, CATARACT, WITH IOL INSERTION
Anesthesia: Monitor Anesthesia Care | Site: Eye | Laterality: Right

## 2021-06-09 MED ORDER — FENTANYL CITRATE (PF) 100 MCG/2ML IJ SOLN
INTRAMUSCULAR | Status: DC | PRN
Start: 2021-06-09 — End: 2021-06-09
  Administered 2021-06-09: 50 ug via INTRAVENOUS

## 2021-06-09 MED ORDER — BRIMONIDINE TARTRATE-TIMOLOL 0.2-0.5 % OP SOLN
OPHTHALMIC | Status: DC | PRN
  Administered 2021-06-09: 1 [drp] via OPHTHALMIC

## 2021-06-09 MED ORDER — RYBELSUS 14 MG PO TABS: 14.0000 mg | ORAL_TABLET | Freq: Every day | ORAL | 0 refills | Status: DC

## 2021-06-09 MED ORDER — MIDAZOLAM HCL 2 MG/2ML IJ SOLN
INTRAMUSCULAR | Status: DC | PRN
  Administered 2021-06-09: 2 mg via INTRAVENOUS

## 2021-06-09 MED ORDER — ARMC OPHTHALMIC DILATING DROPS
1.0000 "application " | OPHTHALMIC | Status: DC | PRN
  Administered 2021-06-09 (×3): 1 via OPHTHALMIC

## 2021-06-09 MED ORDER — SIGHTPATH DOSE#1 BSS IO SOLN
INTRAOCULAR | Status: DC | PRN
  Administered 2021-06-09: 15 mL

## 2021-06-09 MED ORDER — ONDANSETRON HCL 4 MG/2ML IJ SOLN: 4.0000 mg | Freq: Once | INTRAMUSCULAR | Status: DC | PRN

## 2021-06-09 MED ORDER — SIGHTPATH DOSE#1 NA HYALUR & NA CHOND-NA HYALUR IO KIT
PACK | INTRAOCULAR | Status: DC | PRN
  Administered 2021-06-09: 1 via OPHTHALMIC

## 2021-06-09 MED ORDER — TETRACAINE HCL 0.5 % OP SOLN
1.0000 [drp] | OPHTHALMIC | Status: DC | PRN
  Administered 2021-06-09 (×3): 1 [drp] via OPHTHALMIC

## 2021-06-09 MED ORDER — SIGHTPATH DOSE#1 BSS IO SOLN
INTRAOCULAR | Status: DC | PRN
  Administered 2021-06-09: 1 mL via INTRAMUSCULAR

## 2021-06-09 MED ORDER — LACTATED RINGERS IV SOLN: INTRAVENOUS | Status: DC

## 2021-06-09 MED ORDER — SIGHTPATH DOSE#1 BSS IO SOLN
INTRAOCULAR | Status: DC | PRN
  Administered 2021-06-09: 103 mL via OPHTHALMIC

## 2021-06-09 MED ORDER — ACETAMINOPHEN 500 MG PO TABS: 1000.0000 mg | ORAL_TABLET | Freq: Once | ORAL | Status: DC | PRN

## 2021-06-09 MED ORDER — CEFUROXIME OPHTHALMIC INJECTION 1 MG/0.1 ML
INJECTION | OPHTHALMIC | Status: DC | PRN
  Administered 2021-06-09: 0.1 mL via OPHTHALMIC

## 2021-06-09 MED ORDER — ACETAMINOPHEN 160 MG/5ML PO SOLN: 975.0000 mg | Freq: Once | ORAL | Status: DC | PRN

## 2021-06-09 SURGICAL SUPPLY — 10 items
CATARACT SUITE SIGHTPATH (MISCELLANEOUS) ×2 IMPLANT
FEE CATARACT SUITE SIGHTPATH (MISCELLANEOUS) ×1 IMPLANT
GLOVE SURG ENC TEXT LTX SZ7.5 (GLOVE) ×2 IMPLANT
LENS IOL TECNIS EYHANCE 21.5 (Intraocular Lens) ×1 IMPLANT
NDL FILTER BLUNT 18X1 1/2 (NEEDLE) ×1 IMPLANT
NEEDLE FILTER BLUNT 18X 1/2SAF (NEEDLE) ×1
NEEDLE FILTER BLUNT 18X1 1/2 (NEEDLE) ×1 IMPLANT
RING MALYGIN 7.0 (MISCELLANEOUS) ×1 IMPLANT
SYR 3ML LL SCALE MARK (SYRINGE) ×2 IMPLANT
WATER STERILE IRR 250ML POUR (IV SOLUTION) ×2 IMPLANT

## 2021-06-09 NOTE — Telephone Encounter (Signed)
Requested Prescriptions  ?Pending Prescriptions Disp Refills  ?? DROPLET PEN NEEDLES 32G X 4 MM MISC [Pharmacy Med Name: DROPLET PEN NEEDLES 32GX4MM 32G X 4 MM] 100 each 1  ?  Sig: USE WITH [name] INSULIN INJECTION DAILY AS INSTRUCTED  ?  ? Endocrinology: Diabetes - Testing Supplies Passed - 06/09/2021  4:40 AM  ?  ?  Passed - Valid encounter within last 12 months  ?  Recent Outpatient Visits   ?      ? 3 months ago Type 2 diabetes mellitus with other specified complication, with long-term current use of insulin (HCC)  ? [facility] [name], DO  ? 7 months ago Annual physical exam  ? [facility] [name], DO  ? 11 months ago Type 2 diabetes mellitus with other specified complication, with long-term current use of insulin (HCC)  ? [facility] [place], Netta Neat, DO  ? 1 year ago Type 2 diabetes mellitus with other specified complication, with long-term current use of insulin (HCC)  ? [facility] [place], Netta Neat, DO  ? 1 year ago History of non-ST elevation myocardial infarction (NSTEMI)  ? [facility] [name], DO  ?  ?  ? ?  ?  ?  ? ?

## 2021-06-09 NOTE — Anesthesia Preprocedure Evaluation (Signed)
Anesthesia Evaluation  ?Patient identified by MRN, date of birth, ID band ?Patient awake ? ? ? ?History of Anesthesia Complications ?Negative for: history of anesthetic complications ? ?Airway ?Mallampati: I ? ?TM Distance: >3 FB ?Neck ROM: Full ? ? ? Dental ? ?(+) Edentulous Upper ?  ?Pulmonary ?former smoker, PE (on Eliquis) ?  ?Pulmonary exam normal ? ? ? ? ? ? ? Cardiovascular ?hypertension, Pt. on medications and Pt. on home beta blockers ?+ Cardiac Stents  ?Normal cardiovascular exam ? ?Echocardiogram 12/2019, normal ejection fraction 60 to 65%. ?Left heart cath 12/30/2019 proximal RCA 90%, proximal LAD 40%, moderate left circumflex disease.  Status post successful angioplasty and drug-eluting stent placement to proximal RCA. ?  ?Neuro/Psych ?negative neurological ROS ?   ? GI/Hepatic ?GERD  ,  ?Endo/Other  ?diabetes, Type 2, Insulin Dependent, Oral Hypoglycemic AgentsBMI 33 ? Renal/GU ?CRFRenal disease  ? ?  ?Musculoskeletal ? ? Abdominal ?  ?Peds ? Hematology ?negative hematology ROS ?(+)   ?Anesthesia Other Findings ? ? Reproductive/Obstetrics ? ?  ? ? ? ? ? ? ? ? ? ? ? ? ? ?  ?  ? ? ? ? ? ? ? ? ?Anesthesia Physical ? ?Anesthesia Plan ? ?ASA: 3 ? ?Anesthesia Plan: MAC  ? ?Post-op Pain Management: Minimal or no pain anticipated  ? ?Induction: Intravenous ? ?PONV Risk Score and Plan: 1 and TIVA, Midazolam and Treatment may vary due to age or medical condition ? ?Airway Management Planned: Nasal Cannula and Natural Airway ? ?Additional Equipment: None ? ?Intra-op Plan:  ? ?Post-operative Plan:  ? ?Informed Consent: I have reviewed the patients History and Physical, chart, labs and discussed the procedure including the risks, benefits and alternatives for the proposed anesthesia with the patient or authorized representative who has indicated his/her understanding and acceptance.  ? ? ? ? ? ?Plan Discussed with: CRNA ? ?Anesthesia Plan Comments:   ? ? ? ? ? ? ?Anesthesia Quick  Evaluation ? ?

## 2021-06-09 NOTE — Op Note (Signed)
OPERATIVE NOTE ? ?[name] ?[id number] ?06/09/2021 ? ? ?PREOPERATIVE DIAGNOSIS:    Nuclear Sclerotic Cataract Right eye with miotic pupil.        H25.11  ?POSTOPERATIVE DIAGNOSIS: Nuclear Sclerotic Cataract Right eye with miotic pupil. ?       ?  ?PROCEDURE:  Phacoemusification with posterior chamber intraocular lens placement of the right eye which required pupil stretching with the Malyugin pupil expansion device. ?Ultrasound time: Procedure(s) with comments: ?CATARACT EXTRACTION PHACO AND INTRAOCULAR LENS PLACEMENT (IOC) RIGHT DIABETIC MALYUGIN (Right) - Diabetic ?4.75 ?01:05.7 ? ?LENS:   ?Implant Name Type Inv. Item Serial No. Manufacturer Lot No. LRB No. Used Action  ?LENS IOL TECNIS EYHANCE 21.5 - [id number] Intraocular Lens LENS IOL TECNIS EYHANCE 21.5 [id number] [employer]  Right 1 Implanted  ?   ?  ? ?SURGEON:  Deirdre Evener, MD ?  ?ANESTHESIA:  Topical with tetracaine drops and 2% Xylocaine jelly, augmented with 1% preservative-free intracameral lidocaine. ?  ?COMPLICATIONS:  None. ?  ?DESCRIPTION OF PROCEDURE:  The patient was identified in the holding room and transported to the operating room and placed in the supine position under the operating microscope. Theright eye was identified as the operative eye and it was prepped and draped in the usual sterile ophthalmic fashion. ?  ?A 1 millimeter clear-corneal paracentesis was made at the 12:00 position.  0.5 ml of preservative-free 1% lidocaine was injected into the anterior chamber. ?The anterior chamber was filled with Viscoat viscoelastic.  A 2.4 millimeter keratome was used to make a near-clear corneal incision at the 9:00 position. ?A Malyugin pupil expander was then placed through the main incision and into the anterior chamber of the eye.  The edge of the iris was secured on the lip of the pupil expander and it was released, thereby expanding the pupil to approximately 7 millimeters for completion of the cataract surgery.   Additional Viscoat was placed in the anterior chamber.  A cystotome and capsulorrhexis forceps were used to make a curvilinear capsulorrhexis.   Balanced salt solution was used to hydrodissect and hydrodelineate the lens nucleus. ?  ?Phacoemulsification was used in stop and chop fashion to remove the lens, nucleus and epinucleus.  The remaining cortex was aspirated using the irrigation aspiration handpiece.  Additional Provisc was placed into the eye to distend the capsular bag for lens placement.  A lens was then injected into the capsular bag.  The pupil expanding ring was removed using a Kuglen hook and insertion device. The remaining viscoelastic was aspirated from the capsular bag and the anterior chamber.  The anterior chamber was filled with balanced salt solution to inflate to a physiologic pressure.  ?Wounds were hydrated with balanced salt solution.  The anterior chamber was inflated to a physiologic pressure with balanced salt solution.  No wound leaks were noted.Cefuroxime 0.1 ml of a 10mg /ml solution was injected into the anterior chamber for a dose of 1 mg of intracameral antibiotic at the completion of the case. Timolol and Brimonidine drops were applied to the eye.  The patient was taken to the recovery room in stable condition without complications of anesthesia or surgery. ? ?[name] ?06/09/2021, 8:09 AM ? ? ? ?

## 2021-06-09 NOTE — Transfer of Care (Signed)
Immediate Anesthesia Transfer of Care Note ? ?Patient: [name] ? ?Procedure(s) Performed: CATARACT EXTRACTION PHACO AND INTRAOCULAR LENS PLACEMENT ([facility]) RIGHT DIABETIC MALYUGIN (Right: Eye) ? ?Patient Location: PACU ? ?Anesthesia Type: MAC ? ?Level of Consciousness: awake, alert  and patient cooperative ? ?Airway and Oxygen Therapy: Patient Spontanous Breathing and Patient connected to supplemental oxygen ? ?Post-op Assessment: Post-op Vital signs reviewed, Patient's Cardiovascular Status Stable, Respiratory Function Stable, Patent Airway and No signs of Nausea or vomiting ? ?Post-op Vital Signs: Reviewed and stable ? ?Complications: No notable events documented. ? ?

## 2021-06-09 NOTE — H&P (Signed)
[place]  ? ?Primary Care Physician:  [name], DO ?Ophthalmologist: Dr. Leandrew Koyanagi ? ?Pre-Procedure History & Physical: ?HPI:  [name] is a 71 y.o. male here for ophthalmic surgery. ?  ?Past Medical History:  ?Diagnosis Date  ? Allergy   ? CAD (coronary artery disease)   ? a. 01/2019 MV: EF 55-65%, no ischemia; b. 12/2019 NSTEMI/PCI: LM nl, LAD 40p, 68m, D1 40, LCX nl, OM3 40, RCA 90/85p (2.75x34 Resolute Onyx DES), 85m/d.  ? Chronic headaches   ? CKD (chronic kidney disease), stage III ([place])   ? Edentulous   ? no lower teeth  ? GERD (gastroesophageal reflux disease)   ? Gout   ? Heart burn   ? History of echocardiogram   ? a. 12/2019 Echo: EF 60-65%, no rwma, triv MR.  ? Hyperlipidemia   ? Hypertension   ? Motion sickness   ? boats  ? NSTEMI (non-ST elevated myocardial infarction) ([place]) 12/27/2019  ? Pulmonary embolism ([place]) 05/2017  ? Type 2 diabetes mellitus ([place])   ? ? ?Past Surgical History:  ?Procedure Laterality Date  ? BACK SURGERY  02/26/2019  ? CATARACT EXTRACTION W/PHACO Left 04/14/2021  ? Procedure: CATARACT EXTRACTION PHACO AND INTRAOCULAR LENS PLACEMENT ([place]) LEFT DIABETIC maylugin;  Surgeon: Leandrew Koyanagi, MD;  Location: [place];  Service: Ophthalmology;  Laterality: Left;  11.22 ?1:25.1  ? KNEE SURGERY    ? LEFT HEART CATH AND CORONARY ANGIOGRAPHY N/A 12/30/2019  ? Procedure: LEFT HEART CATH AND CORONARY ANGIOGRAPHY;  Surgeon: Wellington Hampshire, MD;  Location: [place] [facility];  Service: Cardiovascular;  Laterality: N/A;  ? SHOULDER SURGERY    ? TONSILLECTOMY    ? ? ?Prior to Admission medications   ?Medication Sig Start Date End Date Taking? Authorizing Provider  ?acetaminophen (TYLENOL) 500 MG tablet Take 1,000 mg by mouth every 8 (eight) hours as needed.    Yes [provider]  ?allopurinol (ZYLOPRIM) 100 MG tablet TAKE 1 TABLET AT BEDTIME 12/27/20  Yes Karamalegos, [name], DO  ?aspirin EC 81 MG EC tablet Take 1  tablet (81 mg total) by mouth daily. Swallow whole. 12/31/19  Yes Wieting, Richard, MD  ?calcium carbonate (TUMS - DOSED IN MG ELEMENTAL CALCIUM) 500 MG chewable tablet Chew 2 tablets by mouth at bedtime as needed for indigestion or heartburn.   Yes [provider]  ?carvedilol (COREG) 12.5 MG tablet TAKE 1 TABLET TWICE DAILY 12/27/20  Yes Karamalegos, [name], DO  ?clopidogrel (PLAVIX) 75 MG tablet Take 1 tablet (75 mg total) by mouth daily. 03/23/21  Yes [name], MD  ?ezetimibe (ZETIA) 10 MG tablet TAKE 1 TABLET EVERY DAY 03/30/21  Yes Karamalegos, [name], DO  ?fluticasone (FLONASE) 50 MCG/ACT nasal spray USE 2 SPRAYS IN EACH NOSTRIL ONCE DAILY USE FOR 4 TO 6 WEEKS, THEN STOP AND USE SEASONALLY OR AS NEEDED. 07/06/20  Yes [name], [name], DO  ?Insulin Glargine ([name] KWIKPEN [place]) Inject 38 Units into the skin.   Yes [provider]  ?isosorbide mononitrate (IMDUR) 30 MG 24 hr tablet Take 0.5 tablets (15 mg total) by mouth daily. 03/23/21 06/21/21 Yes Agbor-Etang, Aaron Edelman, MD  ?lisinopril (ZESTRIL) 40 MG tablet TAKE 1 TABLET EVERY DAY 12/27/20  Yes Karamalegos, [name], DO  ?MITIGARE 0.6 MG CAPS Take for gout flare start Day 1 with 2 tablets then may take 1 extra tablet in 1 hour. Starting Day 2 take 1 tablet daily for up to 7-10 days until resolve.  May increase to 1 tablet twice a day max dose if needed. 03/25/21  Yes Karamalegos, [name], DO  ?nitroGLYCERIN (NITROSTAT) 0.4 MG SL tablet Place 1 tablet (0.4 mg total) under the tongue every 5 (five) minutes as needed for chest pain. 12/31/19  Yes [name], MD  ?pantoprazole (PROTONIX) 20 MG tablet Take 1 tablet (20 mg total) by mouth 2 (two) times daily before a meal. 05/24/21  Yes Karamalegos, [name], DO  ?RYBELSUS 14 MG TABS Take 1 tablet (14 mg total) by mouth daily before breakfast. 05/26/21  Yes [name], [name], DO  ?ACCU-CHEK SMARTVIEW test strip TEST BLOOD SUGAR TWICE DAILY 01/13/21    [name], [name], DO  ?DROPLET PEN NEEDLES 32G X 4 MM MISC USE WITH [name] INSULIN INJECTION DAILY AS INSTRUCTED 10/31/20   [name], DO  ? ? ?Allergies as of 05/07/2021 - Review Complete 05/04/2021  ?Allergen Reaction Noted  ? Crestor [rosuvastatin calcium] Other (See Comments) 06/15/2017  ? Invokana [canagliflozin] Other (See Comments) 04/26/2016  ? Prednisone Other (See Comments) 10/16/2014  ? Sulfamethizole  12/16/2019  ? ? ?Family History  ?Problem Relation Age of Onset  ? Cancer Father   ?     lung  ? Heart disease Sister   ? Heart disease Brother   ? Prostate cancer Neg Hx   ? ? ?Social History  ? ?Socioeconomic History  ? Marital status: Married  ?  Spouse name: Not on file  ? Number of children: Not on file  ? Years of education: Not on file  ? Highest education level: Not on file  ?Occupational History  ? Occupation: retired  ?Tobacco Use  ? Smoking status: Former  ?  Types: Cigars  ?  Quit date: 2000  ?  Years since quitting: 23.2  ? Smokeless tobacco: Current  ?  Types: Chew  ? Tobacco comments:  ?  wife states patient never smoked cigarettes, just chewing tobacco  ?Vaping Use  ? Vaping Use: Never used  ?Substance and Sexual Activity  ? Alcohol use: Not Currently  ?  Alcohol/week: 0.0 standard drinks  ? Drug use: No  ? Sexual activity: Yes  ?  Birth control/protection: Inserts  ?Other Topics Concern  ? Not on file  ?Social History Narrative  ? Not on file  ? ?Social Determinants of Health  ? ?Financial Resource Strain: Low Risk   ? Difficulty of Paying Living Expenses: Not hard at all  ?Food Insecurity: No Food Insecurity  ? Worried About Charity fundraiser in the Last Year: Never true  ? Ran Out of Food in the Last Year: Never true  ?Transportation Needs: No Transportation Needs  ? Lack of Transportation (Medical): No  ? Lack of Transportation (Non-Medical): No  ?Physical Activity: Inactive  ? Days of Exercise per Week: 0 days  ? Minutes of Exercise per Session: 0 min  ?Stress:  No Stress Concern Present  ? Feeling of Stress : Not at all  ?Social Connections: Not on file  ?Intimate Partner Violence: Not on file  ? ? ?Review of Systems: ?See HPI, otherwise negative ROS ? ?Physical Exam: ?BP (!) 156/86   Pulse 78   Temp (!) 97.4 ?F (36.3 ?C) (Temporal)   Ht 5\' 11"  (1.803 m)   Wt 104.5 kg   SpO2 99%   BMI 32.13 kg/m?  ?General:   Alert,  pleasant and cooperative in NAD ?Head:  Normocephalic and atraumatic. ?Lungs:  Clear to auscultation.    ?Heart:  Regular rate and rhythm.  ? ?Impression/Plan: ?[name] is here for ophthalmic surgery. ? ?Risks, benefits, limitations, and alternatives regarding ophthalmic surgery have been reviewed with the patient.  Questions have been answered.  All parties agreeable. ? ? Leandrew Koyanagi, MD  06/09/2021, 7:37 AM ? ?

## 2021-06-09 NOTE — Anesthesia Postprocedure Evaluation (Signed)
Anesthesia Post Note ? ?Patient: [name] ? ?Procedure(s) Performed: CATARACT EXTRACTION PHACO AND INTRAOCULAR LENS PLACEMENT ([facility]) RIGHT DIABETIC MALYUGIN (Right: Eye) ? ? ?  ?Patient location during evaluation: PACU ?Anesthesia Type: MAC ?Level of consciousness: awake and alert ?Pain management: pain level controlled ?Vital Signs Assessment: post-procedure vital signs reviewed and stable ?Respiratory status: spontaneous breathing, nonlabored ventilation and respiratory function stable ?Cardiovascular status: stable and blood pressure returned to baseline ?Postop Assessment: no apparent nausea or vomiting ?Anesthetic complications: no ? ? ?No notable events documented. ? ?[name] ? ? ? ? ? ?

## 2021-06-09 NOTE — Telephone Encounter (Signed)
Medication Refill - Medication: RYBELSUS 14 MG TABS\ ? ?Pt wife requested another 30-day refill. Stated pt still has not received free medication from the manufacturer.  ?Pt only has two tablets left.  ? ?Has the patient contacted their pharmacy? No. ?(Agent: If no, request that the patient contact the pharmacy for the refill. If patient does not wish to contact the pharmacy document the reason why and proceed with request.) ? ? ?Preferred Pharmacy (with phone number or street name):  ?[facility] [address] [place], [place] - [address]  ?[address] [place] [address]  ?Phone: [phone] Fax: [phone]  ?Hours: Not open 24 hours  ? ?Has the patient been seen for an appointment in the last year OR does the patient have an upcoming appointment? No. ? ?Agent: Please be advised that RX refills may take up to 3 business days. We ask that you follow-up with your pharmacy.  ?

## 2021-06-09 NOTE — Telephone Encounter (Signed)
No protocol, sending to [facility] per request as ordered by provider, patient has not received from manufacturer. ?

## 2021-06-10 ENCOUNTER — Encounter: Payer: Self-pay | Admitting: Ophthalmology

## 2021-06-14 ENCOUNTER — Ambulatory Visit (INDEPENDENT_AMBULATORY_CARE_PROVIDER_SITE_OTHER): Payer: Medicare HMO | Admitting: Pharmacist

## 2021-06-14 DIAGNOSIS — E1169 Type 2 diabetes mellitus with other specified complication: Secondary | ICD-10-CM

## 2021-06-14 DIAGNOSIS — I129 Hypertensive chronic kidney disease with stage 1 through stage 4 chronic kidney disease, or unspecified chronic kidney disease: Secondary | ICD-10-CM

## 2021-06-14 NOTE — Patient Instructions (Signed)
Visit Information ? ?Thank you for taking time to visit with me today. Please don't hesitate to contact me if I can be of assistance to you before our next scheduled telephone appointment. ? ?Following are the goals we discussed today:  ? Goals Addressed   ? ?  ?  ?  ?  ? This Visit's Progress  ?  Pharmacy - Patient Goals     ?  Our goal A1c is less than 7%. This corresponds with fasting sugars less than 130 and 2 hour after meal sugars less than 180. Please check your fasting blood sugar each morning and keep a record of the results ? ?Please check your blood pressure and keep record of the results ? ?Feel free to call me with any questions or concerns. I look forward to our next call ? ? ?Estelle Grumbles, PharmD, BCACP ?Clinical Pharmacist ?[facility] ?[place] ?[phone] ? ? ?  ? ?  ? ? ? ?Our next appointment is by telephone on 4/17 at 8:30 am ? ?Please call the care guide team at [phone] if you need to cancel or reschedule your appointment.  ? ? ?Patient verbalizes understanding of instructions and care plan provided today and agrees to view in MyChart. Active MyChart status confirmed with patient.   ? ?

## 2021-06-14 NOTE — Chronic Care Management (AMB) (Signed)
? ?Chronic Care Management ?CCM Pharmacy Note ? ?06/14/2021 ?Name:  [name] MRN:  [id number] DOB:  08/14/1950 ? ? ?Subjective: ?[name] is an 71 y.o. year old male who is a primary patient of Smitty CordsKaramalegos, Alexander J, DO.  The CCM team was consulted for assistance with disease management and care coordination needs.   ? ?Engaged with patient by telephone for follow up visit for pharmacy case management and/or care coordination services.  ? ?Objective: ? ?Medications Reviewed Today   ? ? Reviewed by Manuela Neptuneelles, Samah Lapiana A, RPH-CPP (Pharmacist) on 06/14/21 at [phone]  Med List Status: <None>  ? ?Medication Order Taking? Sig Documenting Provider Last Dose Status Informant  ?ACCU-CHEK SMARTVIEW test strip [id number]  TEST BLOOD SUGAR TWICE DAILY Althea CharonKaramalegos, Netta NeatAlexander J, DO  Active   ?acetaminophen (TYLENOL) 500 MG tablet [id number] Yes Take 1,000 mg by mouth every 8 (eight) hours as needed.  [provider] Taking Active Spouse/Significant Other  ?allopurinol (ZYLOPRIM) 100 MG tablet [id number] Yes TAKE 1 TABLET AT BEDTIME Smitty CordsKaramalegos, Alexander J, DO Taking Active   ?aspirin EC 81 MG EC tablet [id number] Yes Take 1 tablet (81 mg total) by mouth daily. Swallow whole. Alford HighlandWieting, Richard, MD Taking Active   ?carvedilol (COREG) 12.5 MG tablet [id number] Yes TAKE 1 TABLET TWICE DAILY Karamalegos, Netta NeatAlexander J, DO Taking Active   ?clopidogrel (PLAVIX) 75 MG tablet [id number] Yes Take 1 tablet (75 mg total) by mouth daily. Debbe OdeaAgbor-Etang, Brian, MD Taking Active   ?DROPLET PEN NEEDLES 32G X 4 MM MISC [id number]  USE WITH [name] INSULIN INJECTION DAILY AS INSTRUCTED Smitty CordsKaramalegos, Alexander J, DO  Active   ?ezetimibe (ZETIA) 10 MG tablet [id number] Yes TAKE 1 TABLET EVERY DAY Karamalegos, Netta NeatAlexander J, DO Taking Active   ?fluticasone (FLONASE) 50 MCG/ACT nasal spray [id number] Yes USE 2 SPRAYS IN EACH NOSTRIL ONCE DAILY USE FOR 4 TO 6 WEEKS, THEN STOP AND USE SEASONALLY OR AS NEEDED. Smitty CordsKaramalegos, Alexander J, DO Taking  Active   ?Insulin Glargine [facility]([name] KWIKPEN [place]) [id number] Yes Inject 38 Units into the skin. [provider] Taking Active   ?         ?Med Note [facility](KOCMICH, LINDSAY S   Wed Jun 09, 2021  6:40 AM) 28 units night before surgery ?  ?isosorbide mononitrate (IMDUR) 30 MG 24 hr tablet [id number] Yes Take 0.5 tablets (15 mg total) by mouth daily. Debbe OdeaAgbor-Etang, Brian, MD Taking Active   ?lisinopril (ZESTRIL) 40 MG tablet [id number] Yes TAKE 1 TABLET EVERY DAY Althea CharonKaramalegos, Netta NeatAlexander J, DO Taking Active   ?MITIGARE 0.6 MG CAPS [id number] No Take for gout flare start Day 1 with 2 tablets then may take 1 extra tablet in 1 hour. Starting Day 2 take 1 tablet daily for up to 7-10 days until resolve. May increase to 1 tablet twice a day max dose if needed.  ?Patient not taking: Reported on 06/14/2021  ? Smitty CordsKaramalegos, Alexander J, DO Not Taking Active   ?nitroGLYCERIN (NITROSTAT) 0.4 MG SL tablet [id number] No Place 1 tablet (0.4 mg total) under the tongue every 5 (five) minutes as needed for chest pain.  ?Patient not taking: Reported on 06/14/2021  ? Alford HighlandWieting, Richard, MD Not Taking Active   ?         ?Med Note [facility](KOCMICH, LINDSAY S   Wed Jun 09, 2021  6:42 AM) Last used approx 1-2 months ago but said the pain ended up being from a pulled muscle ?  ?pantoprazole (PROTONIX) 20 MG tablet [id number] Yes Take 1 tablet (20 mg  total) by mouth 2 (two) times daily before a meal. Althea Charon, Netta Neat, DO Taking Active   ?RYBELSUS 14 MG TABS [id number]  Take 1 tablet (14 mg total) by mouth daily before breakfast. [name], DO  Active   ? ?  ?  ? ?  ? ? ?Pertinent Labs:  ?Lab Results  ?Component Value Date  ? HGBA1C 10.4 (A) 02/16/2021  ? ?Lab Results  ?Component Value Date  ? CHOL 90 11/03/2020  ? HDL 32 (L) 11/03/2020  ? LDLCALC 36 11/03/2020  ? TRIG 141 11/03/2020  ? CHOLHDL 2.8 11/03/2020  ? ?Lab Results  ?Component Value Date  ? CREATININE 1.42 (H) 05/02/2021  ? BUN 19 05/02/2021  ? NA 135 05/02/2021  ? K 4.2 05/02/2021  ?  CL 104 05/02/2021  ? CO2 25 05/02/2021  ? ?BP Readings from Last 3 Encounters:  ?06/09/21 126/78  ?06/03/21 122/72  ?05/04/21 110/70  ? ?Pulse Readings from Last 3 Encounters:  ?06/09/21 84  ?06/03/21 74  ?05/04/21 80  ? ? ? ?SDOH:  (Social Determinants of Health) assessments and interventions performed:  ? ? ?CCM Care Plan ? ?Review of patient past medical history, allergies, medications, health status, including review of consultants reports, laboratory and other test data, was performed as part of comprehensive evaluation and provision of chronic care management services.  ? ?Care Plan : PharmD - Medication Management  ?Updates made by Manuela Neptune, RPH-CPP since 06/14/2021 12:00 AM  ?  ? ?Problem: Disease Progression   ?  ? ?Long-Range Goal: Disease Progression Prevented or Minimized   ?Start Date: 04/22/2020  ?Expected End Date: 07/21/2020  ?Recent Progress: On track  ?Priority: High  ?Note:   ?Current Barriers:  ?Financial Barriers - patient has [employer] and reports copay for Bydureon & insulin glargine is cost prohibitive at this time ?Patient APPROVED for patient assistance for [name] from [name] through through 03/27/2022 ?Patient APPROVED to receive Rybelsus from [employer] patient assistance program through 03/27/22 ?Lack of blood sugar or blood pressure readings for clinical team ? ?Pharmacist Clinical Goal(s):  ?Over the next 90 days, patient will achieve adherence to monitoring guidelines and medication adherence to achieve therapeutic efficacy through collaboration with PharmD and provider.  ? ?Interventions: ?1:1 collaboration with [name], DO regarding development and update of comprehensive plan of care as evidenced by provider attestation and co-signature ?Inter-disciplinary care team collaboration (see longitudinal plan of care) ?Perform chart review ?Office Visit with [facility] on 3/9 ?Office Visit with [facility]  on 3/13  ?Patient admitted to [facility] on 3/15 for right eye cataract procedure ?Reports recently encountered difficulty with switching Rx for pantoprazole from [facility] to [facility] mail order due to mail order requiring a new prior authorization from provider. ?Reports for now picked up another 30 day supply of pantoprazole from [facility], but will try to obtain next refill from [facility] ?Patient reports tolerating pantoprazole well; acid symptoms controlled ?Today reports doing well since recent eye procedure ?Comprehensive medication review performed; medication list updated in electronic medical record ? ?  ?Type 2 Diabetes: ?Uncontrolled; current treatment: ?Currently out of Rybelsus 14 mg daily ?Reports picked up 30 day supply from [facility] while waiting on supply from assistance program, but today reports due to confusion was taking 2 tablets daily, rather than 1 tablet (14 mg) daily as directed. Next refill available through insurance on 3/25. ?Denies any adverse effects ?Review counseling that patient is to  take Rybelsus 14 mg - 1 tablet daily ?30 minutes before the first food, beverage, or other oral medications of the day with ?4 oz of plain water  ?Patient verbalizes understanding ?Insulin glargine ([name]/Lantus) 38 units QHS ?Previous therapies tried: ?Trulicity (GI intolerance) ?Metformin (unable to tolerate - GI side effects) ?Bydureon (difficulty with device/administration) ?Invokana (felt dehydrated) ?Reports recent home blood sugar readings: ?  Morning fasting Notes  ?13 - March 199   ?14 - March -   ?15 - March -   ?16 - March 144   ?17 - March 132   ?18 - March - *Ran out of Rybelsus  ?19 - March 160   ?20 - March 180* *Larger supper last night  ?Attributes elevated blood sugars to overeating at supper/eating sweets ?Discuss importance of having well-balanced meals while controlling carbohydrate portion sizes and reducing snacking/sweets overnight ?Have  discussed benefits of continuous blood glucose monitor and offered to investigate health plan coverage for CBGM, but patient declined ?Encouraged patient to use glucometer to check blood sugar 2 hours

## 2021-06-25 DIAGNOSIS — Z794 Long term (current) use of insulin: Secondary | ICD-10-CM

## 2021-06-25 DIAGNOSIS — I129 Hypertensive chronic kidney disease with stage 1 through stage 4 chronic kidney disease, or unspecified chronic kidney disease: Secondary | ICD-10-CM

## 2021-06-25 DIAGNOSIS — E1169 Type 2 diabetes mellitus with other specified complication: Secondary | ICD-10-CM

## 2021-06-25 DIAGNOSIS — N183 Chronic kidney disease, stage 3 unspecified: Secondary | ICD-10-CM

## 2021-06-28 ENCOUNTER — Ambulatory Visit: Payer: Medicare HMO | Admitting: Cardiology

## 2021-07-12 ENCOUNTER — Ambulatory Visit (INDEPENDENT_AMBULATORY_CARE_PROVIDER_SITE_OTHER): Payer: Medicare HMO | Admitting: Pharmacist

## 2021-07-12 DIAGNOSIS — N183 Chronic kidney disease, stage 3 unspecified: Secondary | ICD-10-CM

## 2021-07-12 DIAGNOSIS — Z794 Long term (current) use of insulin: Secondary | ICD-10-CM

## 2021-07-12 DIAGNOSIS — E1169 Type 2 diabetes mellitus with other specified complication: Secondary | ICD-10-CM

## 2021-07-12 NOTE — Chronic Care Management (AMB) (Signed)
? ?Chronic Care Management ?CCM Pharmacy Note ? ?07/12/2021 ?Name:  [name] MRN:  [id number] DOB:  1950/08/14 ? ? ?Subjective: ?[name] is an 71 y.o. year old male who is a primary patient of [name], DO.  The CCM team was consulted for assistance with disease management and care coordination needs.   ? ?Engaged with patient and wife by telephone for follow up visit for pharmacy case management and/or care coordination services.  ? ?Objective: ? ?Medications Reviewed Today   ? ? Reviewed by Manuela Neptune, RPH-CPP (Pharmacist) on 07/12/21 at 0900  Med List Status: <None>  ? ?Medication Order Taking? Sig Documenting Provider Last Dose Status Informant  ?ACCU-CHEK SMARTVIEW test strip [id number]  TEST BLOOD SUGAR TWICE DAILY Althea Charon, Netta Neat, DO  Active   ?acetaminophen (TYLENOL) 500 MG tablet [id number]  Take 1,000 mg by mouth every 8 (eight) hours as needed.  [provider]  Active Spouse/Significant Other  ?allopurinol (ZYLOPRIM) 100 MG tablet [id number]  TAKE 1 TABLET AT BEDTIME [name], DO  Active   ?aspirin EC 81 MG EC tablet [id number]  Take 1 tablet (81 mg total) by mouth daily. Swallow whole. Alford Highland, MD  Active   ?carvedilol (COREG) 12.5 MG tablet [id number]  TAKE 1 TABLET TWICE DAILY Karamalegos, Netta Neat, DO  Active   ?clopidogrel (PLAVIX) 75 MG tablet [id number]  Take 1 tablet (75 mg total) by mouth daily. Debbe Odea, MD  Active   ?DROPLET PEN NEEDLES 32G X 4 MM MISC [id number]  USE WITH [name] INSULIN INJECTION DAILY AS INSTRUCTED [name], DO  Active   ?ezetimibe (ZETIA) 10 MG tablet [id number] Yes TAKE 1 TABLET EVERY DAY Karamalegos, Netta Neat, DO Taking Active   ?fluticasone (FLONASE) 50 MCG/ACT nasal spray [id number]  USE 2 SPRAYS IN EACH NOSTRIL ONCE DAILY USE FOR 4 TO 6 WEEKS, THEN STOP AND USE SEASONALLY OR AS NEEDED. [name], DO  Active   ?Insulin Glargine [facility] KWIKPEN  [name]. [provider] Taking Active   ?         ?Med Note Ronney Asters, Terryl Niziolek A   Mon Jul 12, 2021  8:59 AM)    ?isosorbide mononitrate (IMDUR) 30 MG 24 hr tablet [id number]  Take 0.5 tablets (15 mg total) by mouth daily. Debbe Odea, MD  Expired 06/21/21 2359   ?lisinopril (ZESTRIL) 40 MG tablet [id number]  TAKE 1 TABLET EVERY DAY Karamalegos, Netta Neat, DO  Active   ?MITIGARE 0.6 MG CAPS [id number]  Take for gout flare start Day 1 with 2 tablets then may take 1 extra tablet in 1 hour. Starting Day 2 take 1 tablet daily for up to 7-10 days until resolve. May increase to 1 tablet twice a day max dose if needed.  ?Patient not taking: Reported on 06/14/2021  ? [name], DO  Active   ?nitroGLYCERIN (NITROSTAT) 0.4 MG SL tablet [id number]  Place 1 tablet (0.4 mg total) under the tongue every 5 (five) minutes as needed for chest pain.  ?Patient not taking: Reported on 06/14/2021  ? Alford Highland, MD  Active   ?         ?Med Note [facility], LINDSAY S   Wed Jun 09, 2021  6:42 AM) Last used approx 1-2 months ago but said the pain ended up being from a pulled muscle ?  ?pantoprazole (PROTONIX) 20 MG tablet [id number]  Take 1 tablet (20 mg total) by  mouth 2 (two) times daily before a meal. Althea CharonKaramalegos, Netta NeatAlexander J, DO  Active   ?RYBELSUS 14 MG TABS [id number] Yes Take 1 tablet (14 mg total) by mouth daily before breakfast. Smitty CordsKaramalegos, Alexander J, DO Taking Active   ? ?  ?  ? ?  ? ? ?Pertinent Labs:  ?Lab Results  ?Component Value Date  ? HGBA1C 10.4 (A) 02/16/2021  ? ?Lab Results  ?Component Value Date  ? CHOL 90 11/03/2020  ? HDL 32 (L) 11/03/2020  ? LDLCALC 36 11/03/2020  ? TRIG 141 11/03/2020  ? CHOLHDL 2.8 11/03/2020  ? ?Lab Results  ?Component Value Date  ? CREATININE 1.42 (H) 05/02/2021  ? BUN 19 05/02/2021  ? NA 135 05/02/2021  ? K 4.2 05/02/2021  ? CL 104 05/02/2021  ? CO2 25 05/02/2021  ? ? ?SDOH:  (Social Determinants of Health) assessments and  interventions performed:  ? ? ?CCM Care Plan ? ?Review of patient past medical history, allergies, medications, health status, including review of consultants reports, laboratory and other test data, was performed as part of comprehensive evaluation and provision of chronic care management services.  ? ?Care Plan : PharmD - Medication Management  ?Updates made by Manuela Neptuneelles, Breane Grunwald A, RPH-CPP since 07/12/2021 12:00 AM  ?  ? ?Problem: Disease Progression   ?  ? ?Long-Range Goal: Disease Progression Prevented or Minimized   ?Start Date: 04/22/2020  ?Expected End Date: 07/21/2020  ?This Visit's Progress: On track  ?Recent Progress: On track  ?Priority: High  ?Note:   ?Current Barriers:  ?Financial Barriers - patient has [employer] insurance and reports copay for Bydureon & insulin glargine is cost prohibitive at this time ?Patient APPROVED for patient assistance for [name] from [name] through through 03/27/2022 ?Patient APPROVED to receive Rybelsus from [employer] patient assistance program through 03/27/22 ?Lack of blood sugar or blood pressure readings for clinical team ? ?Pharmacist Clinical Goal(s):  ?Over the next 90 days, patient will achieve adherence to monitoring guidelines and medication adherence to achieve therapeutic efficacy through collaboration with PharmD and provider.  ? ?Interventions: ?1:1 collaboration with Smitty CordsKaramalegos, Alexander J, DO regarding development and update of comprehensive plan of care as evidenced by provider attestation and co-signature ?Inter-disciplinary care team collaboration (see longitudinal plan of care) ?Encourage patient to contact office to schedule follow up visit with PCP/A1C check ? ?Type 2 Diabetes: ?Uncontrolled; current treatment: ?Rybelsus 14 mg daily ?Confirms taking ?30 minutes before the first food, beverage, or other oral medications of the day with ?4 oz of plain water  ?Insulin glargine ([name]/Lantus) 38 units QHS ?Previous therapies tried: ?Trulicity (GI  intolerance) ?Metformin (unable to tolerate - GI side effects) ?Bydureon (difficulty with device/administration) ?Invokana (felt dehydrated) ?Reports recent home blood sugar readings: ? Morning fasting  ?10 - April 144  ?11 - April 111  ?12 - April 157  ?13 - April 94  ?14 - April 123  ?15 - April 154  ?16 - April 154  ?17 - April 144  ?Average 135  ?Denies symptoms of hypoglycemia ?Have counseled on s/s of hypoglycemia and how to manage lows ?Counsel on importance of having well-balanced meals while controlling carbohydrate portion sizes and reducing snacking/sweets overnight ?Have discussed benefits of continuous blood glucose monitor and offered to investigate health plan coverage for CBGM, but patient declined ?Exercise: stays active throughout the day working ?  ?Hyperlipidemia: ?Controlled; current treatment: ?Ezetimibe 10 mg daily - reports currently taking every other day ?Previous therapies tried: atorvastatin 40 mg daily & 10  mg daily (unable to tolerate due to myalgia) ?Discuss importance of limiting saturated fats and trans fats in diet ?  ?Hypertension: ?Current treatment: ?Carvedilol 12.5 mg twice daily ?Lisinopril 40 mg daily  ?Imdur 15 mg (1/2 tablet) daily ?Denies checking home blood pressure recently ?Counsel patient to restart monitoring home blood pressure, keep log of results, have to review at upcoming appointments, but to contact office sooner for readings outside of established parameters ?  ?Medication Assistance: ?Collaborated with [facility] Noreene Larsson [place] for assistance to patient with re-enrollment application for [employer] patient assistance for 2023 ?Today confirms received a 4 month supply of Rybelsus from [employer] assistance program ? ?Patient Goals/Self-Care Activities ?Over the next 90 days, patient will:  ?- take medications as directed ? Patient uses weekly pillbox to manage medications ?- check glucose, document, and provide at future appointments ?- check blood  pressure, document, and provide at future appointments ?- attend medical appointments as scheduled ? ?Follow Up Plan: Telephone follow up appointment with care management team member scheduled for: 5/17/

## 2021-07-12 NOTE — Patient Instructions (Signed)
Visit Information ? ?Thank you for taking time to visit with me today. Please don't hesitate to contact me if I can be of assistance to you before our next scheduled telephone appointment. ? ?Following are the goals we discussed today:  ? Goals Addressed   ? ?  ?  ?  ?  ? This Visit's Progress  ?  Pharmacy - Patient Goals     ?  Our goal A1c is less than 7%. This corresponds with fasting sugars less than 130 and 2 hour after meal sugars less than 180. Please check your fasting blood sugar each morning and keep a record of the results ? ?Please check your blood pressure and keep record of the results ? ?Feel free to call me with any questions or concerns. I look forward to our next call ? ? ? ?Wallace Cullens, PharmD, BCACP ?Clinical Pharmacist ?[facility] ?[place] ?[phone] ? ? ?  ? ?  ? ? ? ?Our next appointment is by telephone on 08/11/2021 at 8:30 am ? ?Please call the care guide team at [phone] if you need to cancel or reschedule your appointment.  ? ? ?Patient verbalizes understanding of instructions and care plan provided today and agrees to view in [place]. Active MyChart status confirmed with patient.   ?  ?

## 2021-07-25 DIAGNOSIS — E1122 Type 2 diabetes mellitus with diabetic chronic kidney disease: Secondary | ICD-10-CM

## 2021-07-25 DIAGNOSIS — Z794 Long term (current) use of insulin: Secondary | ICD-10-CM

## 2021-07-25 DIAGNOSIS — N183 Chronic kidney disease, stage 3 unspecified: Secondary | ICD-10-CM

## 2021-07-25 DIAGNOSIS — I129 Hypertensive chronic kidney disease with stage 1 through stage 4 chronic kidney disease, or unspecified chronic kidney disease: Secondary | ICD-10-CM

## 2021-07-25 DIAGNOSIS — E785 Hyperlipidemia, unspecified: Secondary | ICD-10-CM | POA: Diagnosis not present

## 2021-07-25 DIAGNOSIS — Z87891 Personal history of nicotine dependence: Secondary | ICD-10-CM | POA: Diagnosis not present

## 2021-07-27 DIAGNOSIS — R8290 Unspecified abnormal findings in urine: Secondary | ICD-10-CM | POA: Diagnosis not present

## 2021-07-27 DIAGNOSIS — R350 Frequency of micturition: Secondary | ICD-10-CM | POA: Diagnosis not present

## 2021-07-27 DIAGNOSIS — R319 Hematuria, unspecified: Secondary | ICD-10-CM | POA: Diagnosis not present

## 2021-07-27 DIAGNOSIS — R300 Dysuria: Secondary | ICD-10-CM | POA: Diagnosis not present

## 2021-07-27 DIAGNOSIS — R509 Fever, unspecified: Secondary | ICD-10-CM | POA: Diagnosis not present

## 2021-07-27 DIAGNOSIS — N390 Urinary tract infection, site not specified: Secondary | ICD-10-CM | POA: Diagnosis not present

## 2021-08-05 ENCOUNTER — Other Ambulatory Visit: Payer: Medicare HMO

## 2021-08-05 DIAGNOSIS — E785 Hyperlipidemia, unspecified: Secondary | ICD-10-CM

## 2021-08-05 DIAGNOSIS — Z794 Long term (current) use of insulin: Secondary | ICD-10-CM

## 2021-08-05 DIAGNOSIS — E1169 Type 2 diabetes mellitus with other specified complication: Secondary | ICD-10-CM | POA: Diagnosis not present

## 2021-08-06 LAB — LIPID PANEL
Cholesterol: 153 mg/dL (ref <200)
HDL: 32 mg/dL — ABNORMAL LOW (ref >=40)
LDL Cholesterol (Calc): 91 mg/dL (calc)
Non-HDL Cholesterol (Calc): 121 mg/dL (calc) (ref <130)
Total CHOL/HDL Ratio: 4.8 (calc) (ref <5.0)
Triglycerides: 198 mg/dL — ABNORMAL HIGH (ref <150)

## 2021-08-06 LAB — HEMOGLOBIN A1C
Hgb A1c MFr Bld: 7.9 % of total Hgb — ABNORMAL HIGH (ref <5.7)
Mean Plasma Glucose: 180 mg/dL
eAG (mmol/L): 10 mmol/L

## 2021-08-11 ENCOUNTER — Ambulatory Visit (INDEPENDENT_AMBULATORY_CARE_PROVIDER_SITE_OTHER): Payer: Medicare HMO | Admitting: Pharmacist

## 2021-08-11 DIAGNOSIS — E785 Hyperlipidemia, unspecified: Secondary | ICD-10-CM

## 2021-08-11 DIAGNOSIS — I129 Hypertensive chronic kidney disease with stage 1 through stage 4 chronic kidney disease, or unspecified chronic kidney disease: Secondary | ICD-10-CM

## 2021-08-11 DIAGNOSIS — Z794 Long term (current) use of insulin: Secondary | ICD-10-CM

## 2021-08-11 NOTE — Chronic Care Management (AMB) (Signed)
? ?Chronic Care Management ?CCM Pharmacy Note ? ?08/11/2021 ?Name:  [name] MRN:  [id number] DOB:  Jan 02, 1951 ? ? ?Subjective: ?[name] is an 71 y.o. year old male who is a primary patient of [name], DO.  The CCM team was consulted for assistance with disease management and care coordination needs.   ? ?Engaged with patient and spouse by telephone for follow up visit for pharmacy case management and/or care coordination services.  ? ?Objective: ? ?Medications Reviewed Today   ? ? Reviewed by Manuela Neptune, RPH-CPP (Pharmacist) on 08/11/21 at 0904  Med List Status: <None>  ? ?Medication Order Taking? Sig Documenting Provider Last Dose Status Informant  ?ACCU-CHEK SMARTVIEW test strip [id number]  TEST BLOOD SUGAR TWICE DAILY Althea Charon, Netta Neat, DO  Active   ?acetaminophen (TYLENOL) 500 MG tablet [id number]  Take 1,000 mg by mouth every 8 (eight) hours as needed.  [provider]  Active Spouse/Significant Other  ?allopurinol (ZYLOPRIM) 100 MG tablet [id number] Yes TAKE 1 TABLET AT BEDTIME [name], DO Taking Active   ?aspirin EC 81 MG EC tablet [id number] Yes Take 1 tablet (81 mg total) by mouth daily. Swallow whole. Alford Highland, MD Taking Active   ?carvedilol (COREG) 12.5 MG tablet [id number] Yes TAKE 1 TABLET TWICE DAILY Karamalegos, Netta Neat, DO Taking Active   ?clopidogrel (PLAVIX) 75 MG tablet [id number] Yes Take 1 tablet (75 mg total) by mouth daily. Debbe Odea, MD Taking Active   ?DROPLET PEN NEEDLES 32G X 4 MM MISC [id number]  USE WITH [name] INSULIN INJECTION DAILY AS INSTRUCTED [name], DO  Active   ?ezetimibe (ZETIA) 10 MG tablet [id number] Yes TAKE 1 TABLET EVERY DAY Karamalegos, Netta Neat, DO Taking Active   ?fluticasone (FLONASE) 50 MCG/ACT nasal spray [id number] Yes USE 2 SPRAYS IN EACH NOSTRIL ONCE DAILY USE FOR 4 TO 6 WEEKS, THEN STOP AND USE SEASONALLY OR AS NEEDED. [name], DO  Taking Active   ?Insulin Glargine [facility]) [id number] Yes Inject 38 Units into the skin. [provider] Taking Active   ?         ?Med Note Ronney Asters, Ranay Ketter A   Mon Jul 12, 2021  8:59 AM)    ?isosorbide mononitrate (IMDUR) 30 MG 24 hr tablet [id number] Yes Take 0.5 tablets (15 mg total) by mouth daily. Debbe Odea, MD Taking Active   ?lisinopril (ZESTRIL) 40 MG tablet [id number] Yes TAKE 1 TABLET EVERY DAY Althea Charon Netta Neat, DO Taking Active   ?MITIGARE 0.6 MG CAPS [id number] No Take for gout flare start Day 1 with 2 tablets then may take 1 extra tablet in 1 hour. Starting Day 2 take 1 tablet daily for up to 7-10 days until resolve. May increase to 1 tablet twice a day max dose if needed.  ?Patient not taking: Reported on 06/14/2021  ? [name], DO Not Taking Active   ?nitroGLYCERIN (NITROSTAT) 0.4 MG SL tablet [id number]  Place 1 tablet (0.4 mg total) under the tongue every 5 (five) minutes as needed for chest pain.  ?Patient not taking: Reported on 06/14/2021  ? Alford Highland, MD  Active   ?         ?Med Note [facility], LINDSAY S   Wed Jun 09, 2021  6:42 AM) Last used approx 1-2 months ago but said the pain ended up being from a pulled muscle ?  ?pantoprazole (PROTONIX) 20 MG tablet [id number] Yes Take 1 tablet (20 mg total) by mouth  2 (two) times daily before a meal. Althea CharonKaramalegos, Netta NeatAlexander J, DO Taking Active   ?RYBELSUS 14 MG TABS [id number] Yes Take 1 tablet (14 mg total) by mouth daily before breakfast. Smitty CordsKaramalegos, Alexander J, DO Taking Active   ? ?  ?  ? ?  ? ? ?Pertinent Labs:   ?Lab Results  ?Component Value Date  ? HGBA1C 7.9 (H) 08/05/2021  ? ?Lab Results  ?Component Value Date  ? CHOL 153 08/05/2021  ? HDL 32 (L) 08/05/2021  ? LDLCALC 91 08/05/2021  ? TRIG 198 (H) 08/05/2021  ? CHOLHDL 4.8 08/05/2021  ? ?Lab Results  ?Component Value Date  ? CREATININE 1.42 (H) 05/02/2021  ? BUN 19 05/02/2021  ? NA 135 05/02/2021  ? K 4.2 05/02/2021  ? CL 104 05/02/2021   ? CO2 25 05/02/2021  ? ? ?SDOH:  (Social Determinants of Health) assessments and interventions performed:  ? ? ?CCM Care Plan ? ?Review of patient past medical history, allergies, medications, health status, including review of consultants reports, laboratory and other test data, was performed as part of comprehensive evaluation and provision of chronic care management services.  ? ?Care Plan : PharmD - Medication Management  ?Updates made by Manuela Neptuneelles, Rayya Yagi A, RPH-CPP since 08/11/2021 12:00 AM  ?  ? ?Problem: Disease Progression   ?  ? ?Long-Range Goal: Disease Progression Prevented or Minimized   ?Start Date: 04/22/2020  ?Expected End Date: 07/21/2020  ?This Visit's Progress: On track  ?Recent Progress: On track  ?Priority: High  ?Note:   ?Current Barriers:  ?Financial Barriers - patient has [employer] Medicare insurance and reports copay for Bydureon & insulin glargine is cost prohibitive at this time ?Patient APPROVED for patient assistance for [name] from [name] through through 03/27/2022 ?Patient APPROVED to receive Rybelsus from [employer] patient assistance program through 03/27/22 ? ?Pharmacist Clinical Goal(s):  ?Over the next 90 days, patient will achieve adherence to monitoring guidelines and medication adherence to achieve therapeutic efficacy through collaboration with PharmD and provider.  ? ?Interventions: ?1:1 collaboration with Smitty CordsKaramalegos, Alexander J, DO regarding development and update of comprehensive plan of care as evidenced by provider attestation and co-signature ?Inter-disciplinary care team collaboration (see longitudinal plan of care) ?Perform chart review.  ?[facility] related to dysuria. Provider advised patient: ?Rx sent for doxycycline 100 MG tablet - 1 tablet (100 mg total) by mouth 2 (two) times daily for 10 days ?Rx sent for nitrofurantoin, macrocrystal-monohydrate, (MACROBID) 100 MG capsule - 1 capsule (100 mg total) by  mouth 2 (two) times daily for 7 days ?[facility] on 5/9, Rx sent for cefdinir (OMNICEF) 300 mg- 1 capsule by mouth 2 (two) times daily for 10 days ?Today patient/wife report picked up both doxycyline and Macrobid Rx, but patient ONLY took the doxycycline.  ?Reports fever and urinary symptoms resolved with completing the course of doxycycline ?Report [facility] recommending the course of cefdinir, but that patient did not take the cefdinir symptoms were resolved and did not want to take additional medication if not needed.  ?Reports planning to follow up with PCP to see if additional urine culture needed at upcoming appointment tomorrow ? ?Type 2 Diabetes: ?Control recently improved based on A1C of A1C 7.9% on 5/11; current treatment: ?Rybelsus 14 mg daily ?Confirms taking ?30 minutes before the first food, beverage, or other oral medications of the day with ?4 oz of plain water  ?  Insulin glargine ([name]/Lantus) 38 units QHS ?Previous therapies tried: ?Trulicity (GI intolerance) ?Metformin (unable to tolerate - GI side effects) ?Bydureon (difficulty with device/administration) ?Invokana (felt dehydrated) ?Reports recent home blood sugar readings: ? Morning fasting   ?11 - May 171   ?12 - May 93   ?13 - May 150* *Attributes to eating out  ?14 - May 90   ?15 - May 126   ?16 - May 112   ?17 - May 92   ?Attributes improvement in blood sugar control to having smaller meals spread throughout the day to avoid having larger portion sizes at meal times ?Encourage patient to continue positive dietary changes and to have well-balanced meals while controlling carbohydrate portion sizes and limiting nighttime snacking ?Denies symptoms of hypoglycemia ?Counsel patient on s/s of hypoglycemia and how to manage lows ?Encourage patient to obtain and carry glucose tablets ?Exercise: stays active throughout the day working ?Counsel patient if blood sugar blood  sugar consistently <130, may decrease [name] dose by 2 units every 7 days. ?Patient to continue to monitor home blood sugar, keep log of results and have this record to review during appointments, but to con

## 2021-08-11 NOTE — Patient Instructions (Signed)
Visit Information ? ?Thank you for taking time to visit with me today. Please don't hesitate to contact me if I can be of assistance to you before our next scheduled telephone appointment. ? ?Following are the goals we discussed today:  ? Goals Addressed   ? ?  ?  ?  ?  ? This Visit's Progress  ?  Pharmacy - Patient Goals     ?  Our goal A1c is less than 7%. This corresponds with fasting sugars less than 130 and 2 hour after meal sugars less than 180. Please check your fasting blood sugar each morning and keep a record of the results ? ?Please check your blood pressure and keep record of the results ? ?Feel free to call me with any questions or concerns. I look forward to our next call ? ? ?Estelle Grumbles, PharmD, BCACP ?Clinical Pharmacist ?[facility] ?[place] ?[phone] ? ? ?  ? ?  ? ? ? ?Our next appointment is by telephone on 6/19 at 8:30 am ? ?Please call the care guide team at [phone] if you need to cancel or reschedule your appointment.  ? ? ?Patient verbalizes understanding of instructions and care plan provided today and agrees to view in MyChart. Active MyChart status and patient understanding of how to access instructions and care plan via MyChart confirmed with patient.    ? ?

## 2021-08-12 ENCOUNTER — Other Ambulatory Visit: Payer: Self-pay | Admitting: Family Medicine

## 2021-08-12 ENCOUNTER — Ambulatory Visit (INDEPENDENT_AMBULATORY_CARE_PROVIDER_SITE_OTHER): Payer: Medicare HMO | Admitting: Family Medicine

## 2021-08-12 VITALS — BP 136/70 | HR 69 | Ht 71.0 in | Wt 230.8 lb

## 2021-08-12 DIAGNOSIS — Z794 Long term (current) use of insulin: Secondary | ICD-10-CM | POA: Diagnosis not present

## 2021-08-12 DIAGNOSIS — R972 Elevated prostate specific antigen [PSA]: Secondary | ICD-10-CM

## 2021-08-12 DIAGNOSIS — E785 Hyperlipidemia, unspecified: Secondary | ICD-10-CM

## 2021-08-12 DIAGNOSIS — M1000 Idiopathic gout, unspecified site: Secondary | ICD-10-CM

## 2021-08-12 DIAGNOSIS — I129 Hypertensive chronic kidney disease with stage 1 through stage 4 chronic kidney disease, or unspecified chronic kidney disease: Secondary | ICD-10-CM

## 2021-08-12 DIAGNOSIS — N183 Chronic kidney disease, stage 3 unspecified: Secondary | ICD-10-CM | POA: Diagnosis not present

## 2021-08-12 DIAGNOSIS — E1169 Type 2 diabetes mellitus with other specified complication: Secondary | ICD-10-CM

## 2021-08-12 DIAGNOSIS — N3000 Acute cystitis without hematuria: Secondary | ICD-10-CM | POA: Diagnosis not present

## 2021-08-12 DIAGNOSIS — Z0000 Encounter for general adult medical examination without abnormal findings: Secondary | ICD-10-CM

## 2021-08-12 DIAGNOSIS — Z1211 Encounter for screening for malignant neoplasm of colon: Secondary | ICD-10-CM | POA: Diagnosis not present

## 2021-08-12 DIAGNOSIS — G720 Drug-induced myopathy: Secondary | ICD-10-CM | POA: Diagnosis not present

## 2021-08-12 NOTE — Assessment & Plan Note (Signed)
Improved cholesterol on Zetia only Off Statin due to myopathy Failed Rosuvastatin DISCONTINUE Atorvastatin 10mg , unable to tolerate any dose at daily dosing.  Continue Zetia 10mg  daily  We can consider alternative statin such as Pravastatin or intermittent dosing in future or new med Nexletol or PCSK9 inhibitor injections

## 2021-08-12 NOTE — Assessment & Plan Note (Signed)
Secondary to side effect on statin (failed rosuvastatin, crestor, lipitor in past)  Remain off statin now, continue Zetia

## 2021-08-12 NOTE — Patient Instructions (Addendum)
Thank you for coming to the office today.  Keep on Zetia Cholesterol looks good  May continue to reduce [name] insulin from 38 down to 36 and down by 2 units every 1 week if avg sugar < 130.  Okay for fasting sugar in morning to be 100-140 range.  Keep on Rybelsus 14  Recent Labs    11/03/20 0813 02/16/21 1421 08/05/21 0818  HGBA1C 11.8* 10.4* 7.9*   Stop Doxycycline. Yes likely photosensitivity to sun.  Start the Cefdinir.  We will re-check the Urine today.  Please schedule a Follow-up Appointment to: Return in about 6 months (around 02/12/2022) for 6 month need fasting lab then 1 wk later Annual Physical and also Annual Medicare Wellness.  If you have any other questions or concerns, please feel free to call the office or send a message through MyChart. You may also schedule an earlier appointment if necessary.  Additionally, you may be receiving a survey about your experience at our office within a few days to 1 week by e-mail or mail. We value your feedback.  Saralyn Pilar, DO [facility], [place]

## 2021-08-12 NOTE — Assessment & Plan Note (Signed)
Well-controlled HTN Complication with CKD-III - stable to improved Cr Followed by [facility] Dr Thedore Mins   Plan:  1. Continue current BP regimen - Lisinopril 40mg  daily, Carvedilol 12.5mg  BID 2. Encourage improved lifestyle - low sodium diet, regular exercise improve as tolerated 3. Continue monitor BP outside office, bring readings to next visit, if persistently >140/90 or new symptoms notify office sooner

## 2021-08-12 NOTE — Assessment & Plan Note (Signed)
Improved A1c to 7.9 Improving lifestyle On Rybelsus now oral GLP1 No hypoglycemia Complications - CKD-III nephropathy, other neuropathy from spine but less likely DM neuropathy, other including hyperlipidemia, GERD, obesity - increases risk of future cardiovascular complications  Off Tradjenta, duplicate therapy/cost, Invokana Failed Trulicity 0.75 and 1.5mg  weekly d/t GI intolerance OFF Metformin due to AKI - and GI intolerance  Plan:  1. Contiue Rybelsus 7mg  daily before 1st meal 2. Reduce [name] from 38 to 36, down by 2 units per week if fasting avg CBG <130 Continue working w/ CCM pharmacy interval between improved lifestyle - low carb, low sugar diet, reduce portion size, continue improving regular exercise Check CBG, bring log to next visit for review Continue ACEi  Urine Microalbumin today

## 2021-08-12 NOTE — Progress Notes (Signed)
Subjective:    Patient ID: [name], male    DOB: [birth year], 71 y.o.   MRN: [id number]  [name] is a 71 y.o. male presenting on 08/12/2021 for Diabetes and Hyperlipidemia   HPI  CHRONIC DM, Type 2 with nephropathy / CKD-III Major improved A1c in past >6 months down from >11 to 7.9% 08/05/21  He had a cortisone injection in shoulder about 30 days ago. He has had increase Insulin up to 35u nightly His CBG readings avg 120-140 Self discontinued Metformin due to nausea. Meds: - On Rybelsus 14 mg - On [name] 36 to 38 units nightly  - Remains off Metformin Reports  good compliance. Tolerating well w/o side-effects Currently on ACEi Lifestyle: - Diet (improved DM Diet, except now still increased sweets) - Exercise limited Due for DM Eye exam. Denies hypoglycemia, polyuria, visual changes, numbness or tingling.   HYPERLIPIDEMIA Drug Induced Myopathy Last time was on Atorvastatin 40mg  down to 10mg . Now takes Atorvastatin 10mg  daily and also on Zetia 10mg  daily. Doing much better less side effects and issues. - Lipid panel shows controlled normal Lipids TC 90 and LDL 36   Hypertension Controlled on current medication.  UTI Recently treated for UTI, he took initially doxycycline had some photosensitivity, given Macrobid then switched to Cefdinir. However they did not start this antibiotic yet. He started with some dysuria again today.   Health Maintenance: Due for repeat Cologuard, last was negative 04/2018     01/26/2021    8:31 AM 11/10/2020    2:14 PM 01/23/2020    4:14 PM  Depression screen PHQ 2/9  Decreased Interest 0 0 0  Down, Depressed, Hopeless 0 0 0  PHQ - 2 Score 0 0 0  Altered sleeping  0 0  Tired, decreased energy  0 1  Change in appetite  0 0  Feeling bad or failure about yourself   0 0  Trouble concentrating  0 0  Moving slowly or fidgety/restless  0 0  Suicidal thoughts  0 0  PHQ-9 Score  0 1  Difficult doing work/chores  Not  difficult at all Not difficult at all    Social History   Tobacco Use   Smoking status: Former    Types: Cigars    Quit date: 2000    Years since quitting: 23.3   Smokeless tobacco: Current    Types: Chew   Tobacco comments:    wife states patient never smoked cigarettes, just chewing tobacco  Vaping Use   Vaping Use: Never used  Substance Use Topics   Alcohol use: Not Currently    Alcohol/week: 0.0 standard drinks   Drug use: No    Review of Systems Per HPI unless specifically indicated above     Objective:    BP 136/70   Pulse 69   Ht 5\' 11"  (1.803 m)   Wt 230 lb 12.8 oz (104.7 kg)   SpO2 99%   BMI 32.19 kg/m   Wt Readings from Last 3 Encounters:  08/12/21 230 lb 12.8 oz (104.7 kg)  06/09/21 230 lb 6.4 oz (104.5 kg)  06/03/21 233 lb (105.7 kg)    Physical Exam Vitals and nursing note reviewed.  Constitutional:      General: He is not in acute distress.    Appearance: He is well-developed. He is not diaphoretic.     Comments: Well-appearing, comfortable, cooperative  HENT:     Head: Normocephalic and atraumatic.  Eyes:     General:  Right eye: No discharge.        Left eye: No discharge.     Conjunctiva/sclera: Conjunctivae normal.  Neck:     Thyroid: No thyromegaly.  Cardiovascular:     Rate and Rhythm: Normal rate and regular rhythm.     Pulses: Normal pulses.     Heart sounds: Normal heart sounds. No murmur heard. Pulmonary:     Effort: Pulmonary effort is normal. No respiratory distress.     Breath sounds: Normal breath sounds. No wheezing or rales.  Musculoskeletal:        General: Normal range of motion.     Cervical back: Normal range of motion and neck supple.  Lymphadenopathy:     Cervical: No cervical adenopathy.  Skin:    General: Skin is warm and dry.     Findings: No erythema or rash.  Neurological:     Mental Status: He is alert and oriented to person, place, and time. Mental status is at baseline.  Psychiatric:         Behavior: Behavior normal.     Comments: Well groomed, good eye contact, normal speech and thoughts    Recent Labs    11/03/20 0813 02/16/21 1421 08/05/21 0818  HGBA1C 11.8* 10.4* 7.9*     Results for orders placed or performed in visit on 08/05/21  Hemoglobin A1c  Result Value Ref Range   Hgb A1c MFr Bld 7.9 (H) <5.7 % of total Hgb   Mean Plasma Glucose 180 mg/dL   eAG (mmol/L) 10.0 mmol/L  Lipid panel  Result Value Ref Range   Cholesterol 153 <200 mg/dL   HDL 32 (L) > OR = 40 mg/dL   Triglycerides 198 (H) <150 mg/dL   LDL Cholesterol (Calc) 91 mg/dL (calc)   Total CHOL/HDL Ratio 4.8 <5.0 (calc)   Non-HDL Cholesterol (Calc) 121 <130 mg/dL (calc)      Assessment & Plan:   Problem List Items Addressed This Visit     Type 2 diabetes mellitus with other specified complication (HCC) - Primary    Improved A1c to 7.9 Improving lifestyle On Rybelsus now oral GLP1 No hypoglycemia Complications - CKD-III nephropathy, other 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 Contiue Rybelsus 7mg  daily before 1st meal 2. Reduce [name] from 38 to 36, down by 2 units per week if fasting avg CBG <130 Continue working w/ CCM pharmacy interval between [employer] improved lifestyle - low carb, low sugar diet, reduce portion size, continue improving regular exercise Check CBG, bring log to next visit for review Continue ACEi  Urine Microalbumin today      Relevant Orders   Urinalysis, Routine w reflex microscopic   Hyperlipidemia associated with type 2 diabetes mellitus (HCC)    Improved cholesterol on Zetia only Off Statin due to myopathy Failed Rosuvastatin DISCONTINUE Atorvastatin 10mg , unable to  tolerate any dose at daily dosing.  Continue Zetia 10mg  daily  We can consider alternative statin such as Pravastatin or intermittent dosing in future or new med Nexletol or PCSK9 inhibitor injections        Drug-induced myopathy    Secondary to side effect on statin (failed rosuvastatin, crestor, lipitor in past)  Remain off statin now, continue Zetia       Benign  hypertension with CKD (chronic kidney disease) stage III (HCC)    Well-controlled HTN Complication with CKD-III - stable to improved Cr Followed by [facility] Dr Candiss Norse   Plan:  1. Continue current BP regimen - Lisinopril 40mg  daily, Carvedilol 12.5mg  BID 2. Encourage improved lifestyle - low sodium diet, regular exercise improve as tolerated 3. Continue monitor BP outside office, bring readings to next visit, if persistently >140/90 or new symptoms notify office sooner       Relevant Orders   Urinalysis, Routine w reflex microscopic   Other Visit Diagnoses     Acute cystitis without hematuria       Relevant Orders   Urine Microalbumin w/creat. ratio   Urine Culture   Screening for colon cancer       Relevant Orders   Cologuard      UTI Initially improved on Doxy, then switched to [place], but did not resolve. Urine culture showed resistance. Now has Cefdinir but not taking yet. Still has dysuria Repeat urine studies today urinalysis and urine culture   Due for routine colon cancer screening. - Discussion today about recommendations for either Colonoscopy or Cologuard screening, benefits and risks of screening, interested in Cologuard, understands that if positive then recommendation is for diagnostic colonoscopy to follow-up. - Ordered Cologuard today  Orders Placed This Encounter  Procedures   Urine Culture   Urinalysis, Routine w reflex microscopic   Urine Microalbumin w/creat. ratio   Cologuard    No orders of the defined types were placed in this encounter.    Follow up plan: Return  in about 6 months (around 02/12/2022) for 6 month need fasting lab then 1 wk later Annual Physical and also Annual Medicare Wellness.   Nobie Putnam, [facility] 08/12/2021, 8:52 AM

## 2021-08-13 ENCOUNTER — Ambulatory Visit: Payer: Medicare HMO

## 2021-08-14 LAB — URINALYSIS, ROUTINE W REFLEX MICROSCOPIC
Bilirubin Urine: NEGATIVE
Hyaline Cast: NONE SEEN /LPF
Ketones, ur: NEGATIVE
Nitrite: NEGATIVE
Specific Gravity, Urine: 1.018 (ref 1.001–1.035)
Squamous Epithelial / HPF: NONE SEEN /HPF (ref <=5)
WBC, UA: >=60 /HPF — AB (ref 0–5)
pH: 5.5 (ref 5.0–8.0)

## 2021-08-14 LAB — MICROALBUMIN / CREATININE URINE RATIO
Creatinine, Urine: 133 mg/dL (ref 20–320)
Microalb Creat Ratio: 25 mcg/mg creat (ref <30)
Microalb, Ur: 3.3 mg/dL

## 2021-08-14 LAB — URINE CULTURE
MICRO NUMBER:: 13415458
SPECIMEN QUALITY:: ADEQUATE

## 2021-08-14 LAB — MICROSCOPIC MESSAGE

## 2021-08-25 DIAGNOSIS — Z794 Long term (current) use of insulin: Secondary | ICD-10-CM | POA: Diagnosis not present

## 2021-08-25 DIAGNOSIS — F17220 Nicotine dependence, chewing tobacco, uncomplicated: Secondary | ICD-10-CM | POA: Diagnosis not present

## 2021-08-25 DIAGNOSIS — E1169 Type 2 diabetes mellitus with other specified complication: Secondary | ICD-10-CM | POA: Diagnosis not present

## 2021-08-25 DIAGNOSIS — E785 Hyperlipidemia, unspecified: Secondary | ICD-10-CM | POA: Diagnosis not present

## 2021-08-25 DIAGNOSIS — I10 Essential (primary) hypertension: Secondary | ICD-10-CM

## 2021-08-28 DIAGNOSIS — Z1211 Encounter for screening for malignant neoplasm of colon: Secondary | ICD-10-CM | POA: Diagnosis not present

## 2021-09-05 ENCOUNTER — Emergency Department: Payer: Medicare HMO

## 2021-09-05 ENCOUNTER — Inpatient Hospital Stay
Admission: EM | Admit: 2021-09-05 | Discharge: 2021-09-09 | DRG: 871 | Disposition: A | Discharge status: Home / Self Care | Payer: Medicare HMO | Attending: Internal Medicine | Admitting: Internal Medicine

## 2021-09-05 ENCOUNTER — Other Ambulatory Visit: Payer: Self-pay

## 2021-09-05 DIAGNOSIS — B9620 Unspecified Escherichia coli [E. coli] as the cause of diseases classified elsewhere: Secondary | ICD-10-CM | POA: Diagnosis not present

## 2021-09-05 DIAGNOSIS — I248 Other forms of acute ischemic heart disease: Secondary | ICD-10-CM | POA: Diagnosis not present

## 2021-09-05 DIAGNOSIS — I129 Hypertensive chronic kidney disease with stage 1 through stage 4 chronic kidney disease, or unspecified chronic kidney disease: Secondary | ICD-10-CM | POA: Diagnosis present

## 2021-09-05 DIAGNOSIS — A419 Sepsis, unspecified organism: Principal | ICD-10-CM

## 2021-09-05 DIAGNOSIS — Z9861 Coronary angioplasty status: Secondary | ICD-10-CM | POA: Diagnosis not present

## 2021-09-05 DIAGNOSIS — E119 Type 2 diabetes mellitus without complications: Secondary | ICD-10-CM | POA: Insufficient documentation

## 2021-09-05 DIAGNOSIS — N1831 Chronic kidney disease, stage 3a: Secondary | ICD-10-CM

## 2021-09-05 DIAGNOSIS — Z888 Allergy status to other drugs, medicaments and biological substances status: Secondary | ICD-10-CM

## 2021-09-05 DIAGNOSIS — Z7902 Long term (current) use of antithrombotics/antiplatelets: Secondary | ICD-10-CM

## 2021-09-05 DIAGNOSIS — R7881 Bacteremia: Secondary | ICD-10-CM | POA: Diagnosis not present

## 2021-09-05 DIAGNOSIS — Z79899 Other long term (current) drug therapy: Secondary | ICD-10-CM

## 2021-09-05 DIAGNOSIS — E1169 Type 2 diabetes mellitus with other specified complication: Secondary | ICD-10-CM | POA: Diagnosis present

## 2021-09-05 DIAGNOSIS — R079 Chest pain, unspecified: Secondary | ICD-10-CM | POA: Diagnosis not present

## 2021-09-05 DIAGNOSIS — Z801 Family history of malignant neoplasm of trachea, bronchus and lung: Secondary | ICD-10-CM | POA: Diagnosis not present

## 2021-09-05 DIAGNOSIS — I10 Essential (primary) hypertension: Secondary | ICD-10-CM | POA: Diagnosis present

## 2021-09-05 DIAGNOSIS — E785 Hyperlipidemia, unspecified: Secondary | ICD-10-CM | POA: Diagnosis not present

## 2021-09-05 DIAGNOSIS — R748 Abnormal levels of other serum enzymes: Secondary | ICD-10-CM | POA: Diagnosis not present

## 2021-09-05 DIAGNOSIS — I25118 Atherosclerotic heart disease of native coronary artery with other forms of angina pectoris: Secondary | ICD-10-CM | POA: Diagnosis present

## 2021-09-05 DIAGNOSIS — R0602 Shortness of breath: Secondary | ICD-10-CM | POA: Diagnosis not present

## 2021-09-05 DIAGNOSIS — Z955 Presence of coronary angioplasty implant and graft: Secondary | ICD-10-CM | POA: Diagnosis not present

## 2021-09-05 DIAGNOSIS — R9431 Abnormal electrocardiogram [ECG] [EKG]: Secondary | ICD-10-CM | POA: Diagnosis not present

## 2021-09-05 DIAGNOSIS — E1122 Type 2 diabetes mellitus with diabetic chronic kidney disease: Secondary | ICD-10-CM | POA: Diagnosis present

## 2021-09-05 DIAGNOSIS — I2510 Atherosclerotic heart disease of native coronary artery without angina pectoris: Secondary | ICD-10-CM

## 2021-09-05 DIAGNOSIS — A4151 Sepsis due to Escherichia coli [E. coli]: Principal | ICD-10-CM | POA: Diagnosis present

## 2021-09-05 DIAGNOSIS — Z86711 Personal history of pulmonary embolism: Secondary | ICD-10-CM

## 2021-09-05 DIAGNOSIS — M109 Gout, unspecified: Secondary | ICD-10-CM | POA: Diagnosis present

## 2021-09-05 DIAGNOSIS — R6520 Severe sepsis without septic shock: Secondary | ICD-10-CM | POA: Diagnosis not present

## 2021-09-05 DIAGNOSIS — R6883 Chills (without fever): Secondary | ICD-10-CM | POA: Diagnosis not present

## 2021-09-05 DIAGNOSIS — I252 Old myocardial infarction: Secondary | ICD-10-CM | POA: Diagnosis not present

## 2021-09-05 DIAGNOSIS — Z1152 Encounter for screening for COVID-19: Secondary | ICD-10-CM | POA: Diagnosis not present

## 2021-09-05 DIAGNOSIS — N390 Urinary tract infection, site not specified: Secondary | ICD-10-CM | POA: Diagnosis present

## 2021-09-05 DIAGNOSIS — I214 Non-ST elevation (NSTEMI) myocardial infarction: Secondary | ICD-10-CM | POA: Diagnosis present

## 2021-09-05 DIAGNOSIS — Z794 Long term (current) use of insulin: Secondary | ICD-10-CM

## 2021-09-05 DIAGNOSIS — Z8249 Family history of ischemic heart disease and other diseases of the circulatory system: Secondary | ICD-10-CM | POA: Diagnosis not present

## 2021-09-05 DIAGNOSIS — N179 Acute kidney failure, unspecified: Secondary | ICD-10-CM | POA: Diagnosis not present

## 2021-09-05 DIAGNOSIS — K219 Gastro-esophageal reflux disease without esophagitis: Secondary | ICD-10-CM | POA: Diagnosis present

## 2021-09-05 DIAGNOSIS — N281 Cyst of kidney, acquired: Secondary | ICD-10-CM | POA: Diagnosis not present

## 2021-09-05 DIAGNOSIS — N189 Chronic kidney disease, unspecified: Secondary | ICD-10-CM | POA: Diagnosis present

## 2021-09-05 LAB — BASIC METABOLIC PANEL
Anion gap: 13 (ref 5–15)
BUN: 19 mg/dL (ref 8–23)
CO2: 23 mmol/L (ref 22–32)
Calcium: 9.3 mg/dL (ref 8.9–10.3)
Chloride: 106 mmol/L (ref 98–111)
Creatinine, Ser: 1.81 mg/dL — ABNORMAL HIGH (ref 0.61–1.24)
GFR, Estimated: 40 mL/min — ABNORMAL LOW (ref >60)
Glucose, Bld: 167 mg/dL — ABNORMAL HIGH (ref 70–99)
Potassium: 5 mmol/L (ref 3.5–5.1)
Sodium: 142 mmol/L (ref 135–145)

## 2021-09-05 LAB — HEPATIC FUNCTION PANEL
ALT: 20 U/L (ref 0–44)
AST: 31 U/L (ref 15–41)
Albumin: 4.1 g/dL (ref 3.5–5.0)
Alkaline Phosphatase: 70 U/L (ref 38–126)
Bilirubin, Direct: 0.3 mg/dL — ABNORMAL HIGH (ref 0.0–0.2)
Indirect Bilirubin: 1 mg/dL — ABNORMAL HIGH (ref 0.3–0.9)
Total Bilirubin: 1.3 mg/dL — ABNORMAL HIGH (ref 0.3–1.2)
Total Protein: 8.3 g/dL — ABNORMAL HIGH (ref 6.5–8.1)

## 2021-09-05 LAB — APTT: aPTT: 27 seconds (ref 24–36)

## 2021-09-05 LAB — LACTIC ACID, PLASMA
Lactic Acid, Venous: 5.7 mmol/L (ref 0.5–1.9)
Lactic Acid, Venous: 1.4 mmol/L (ref 0.5–1.9)

## 2021-09-05 LAB — URINALYSIS, COMPLETE (UACMP) WITH MICROSCOPIC
Bilirubin Urine: NEGATIVE
Glucose, UA: 150 mg/dL — AB
Ketones, ur: NEGATIVE mg/dL
Nitrite: NEGATIVE
Protein, ur: >=300 mg/dL — AB
Specific Gravity, Urine: 1.014 (ref 1.005–1.030)
WBC, UA: >50 WBC/hpf — ABNORMAL HIGH (ref 0–5)
pH: 6 (ref 5.0–8.0)

## 2021-09-05 LAB — CBC
HCT: 42.5 % (ref 39.0–52.0)
Hemoglobin: 13.2 g/dL (ref 13.0–17.0)
MCH: 28.5 pg (ref 26.0–34.0)
MCHC: 31.1 g/dL (ref 30.0–36.0)
MCV: 91.8 fL (ref 80.0–100.0)
Platelets: 258 10*3/uL (ref 150–400)
RBC: 4.63 MIL/uL (ref 4.22–5.81)
RDW: 15.2 % (ref 11.5–15.5)
WBC: 5.2 10*3/uL (ref 4.0–10.5)
nRBC: 0 % (ref 0.0–0.2)

## 2021-09-05 LAB — RESP PANEL BY RT-PCR (FLU A&B, COVID) ARPGX2
Influenza A by PCR: NEGATIVE
Influenza B by PCR: NEGATIVE
SARS Coronavirus 2 by RT PCR: NEGATIVE

## 2021-09-05 LAB — PROTIME-INR
INR: 1.1 (ref 0.8–1.2)
Prothrombin Time: 13.8 seconds (ref 11.4–15.2)

## 2021-09-05 LAB — CBG MONITORING, ED: Glucose-Capillary: 173 mg/dL — ABNORMAL HIGH (ref 70–99)

## 2021-09-05 LAB — BRAIN NATRIURETIC PEPTIDE: B Natriuretic Peptide: 71.6 pg/mL (ref 0.0–100.0)

## 2021-09-05 LAB — TROPONIN I (HIGH SENSITIVITY)
Troponin I (High Sensitivity): 13 ng/L (ref <18)
Troponin I (High Sensitivity): 284 ng/L (ref <18)

## 2021-09-05 LAB — D-DIMER, QUANTITATIVE: D-Dimer, Quant: 2.61 ug/mL-FEU — ABNORMAL HIGH (ref 0.00–0.50)

## 2021-09-05 MED ORDER — VANCOMYCIN HCL 2000 MG/400ML IV SOLN
2000.0000 mg | Freq: Once | INTRAVENOUS | Status: AC
  Administered 2021-09-05: 2000 mg via INTRAVENOUS
  Filled 2021-09-05: qty 400

## 2021-09-05 MED ORDER — SODIUM CHLORIDE 0.9 % IV BOLUS (SEPSIS)
1000.0000 mL | Freq: Once | INTRAVENOUS | Status: AC
  Administered 2021-09-05: 1000 mL via INTRAVENOUS

## 2021-09-05 MED ORDER — METRONIDAZOLE 500 MG/100ML IV SOLN
500.0000 mg | Freq: Two times a day (BID) | INTRAVENOUS | Status: DC
  Filled 2021-09-05: qty 100

## 2021-09-05 MED ORDER — ONDANSETRON HCL 4 MG/2ML IJ SOLN
4.0000 mg | Freq: Once | INTRAMUSCULAR | Status: AC
  Administered 2021-09-05: 4 mg via INTRAVENOUS
  Filled 2021-09-05: qty 2

## 2021-09-05 MED ORDER — SODIUM CHLORIDE 0.9 % IV BOLUS
2200.0000 mL | Freq: Once | INTRAVENOUS | Status: AC
  Administered 2021-09-05: 2200 mL via INTRAVENOUS

## 2021-09-05 MED ORDER — SODIUM CHLORIDE 0.9 % IV SOLN
2.0000 g | Freq: Once | INTRAVENOUS | Status: AC
  Administered 2021-09-05: 2 g via INTRAVENOUS
  Filled 2021-09-05: qty 12.5

## 2021-09-05 MED ORDER — LACTATED RINGERS IV SOLN: INTRAVENOUS | Status: DC

## 2021-09-05 MED ORDER — DOCUSATE SODIUM 100 MG PO CAPS
100.0000 mg | ORAL_CAPSULE | Freq: Two times a day (BID) | ORAL | Status: DC
  Administered 2021-09-06 – 2021-09-09 (×7): 100 mg via ORAL
  Filled 2021-09-05 (×7): qty 1

## 2021-09-05 MED ORDER — EZETIMIBE 10 MG PO TABS
10.0000 mg | ORAL_TABLET | Freq: Every day | ORAL | Status: DC
  Administered 2021-09-06 – 2021-09-09 (×4): 10 mg via ORAL
  Filled 2021-09-05 (×4): qty 1

## 2021-09-05 MED ORDER — ACETAMINOPHEN 500 MG PO TABS
1000.0000 mg | ORAL_TABLET | Freq: Once | ORAL | Status: AC
  Administered 2021-09-05: 1000 mg via ORAL
  Filled 2021-09-05: qty 2

## 2021-09-05 MED ORDER — CLOPIDOGREL BISULFATE 75 MG PO TABS
75.0000 mg | ORAL_TABLET | Freq: Every day | ORAL | Status: DC
  Administered 2021-09-06 – 2021-09-09 (×4): 75 mg via ORAL
  Filled 2021-09-05 (×4): qty 1

## 2021-09-05 MED ORDER — HEPARIN SODIUM (PORCINE) 5000 UNIT/ML IJ SOLN: 5000.0000 [IU] | Freq: Three times a day (TID) | INTRAMUSCULAR | Status: DC

## 2021-09-05 MED ORDER — HEPARIN (PORCINE) 25000 UT/250ML-% IV SOLN
1400.0000 [IU]/h | INTRAVENOUS | Status: DC
  Administered 2021-09-06 (×2): 1300 [IU]/h via INTRAVENOUS
  Administered 2021-09-07: 1400 [IU]/h via INTRAVENOUS
  Filled 2021-09-05 (×3): qty 250

## 2021-09-05 MED ORDER — ISOSORBIDE MONONITRATE ER 30 MG PO TB24
15.0000 mg | ORAL_TABLET | Freq: Every day | ORAL | Status: DC
  Administered 2021-09-06 – 2021-09-08 (×3): 15 mg via ORAL
  Filled 2021-09-05 (×3): qty 1

## 2021-09-05 MED ORDER — CARVEDILOL 6.25 MG PO TABS
12.5000 mg | ORAL_TABLET | Freq: Two times a day (BID) | ORAL | Status: DC
  Administered 2021-09-06 – 2021-09-09 (×7): 12.5 mg via ORAL
  Filled 2021-09-05 (×7): qty 2

## 2021-09-05 MED ORDER — HYDRALAZINE HCL 20 MG/ML IJ SOLN: 10.0000 mg | Freq: Four times a day (QID) | INTRAMUSCULAR | Status: DC | PRN

## 2021-09-05 MED ORDER — CEFEPIME HCL 2 G IV SOLR
2.0000 g | Freq: Two times a day (BID) | INTRAVENOUS | Status: DC
  Administered 2021-09-06: 2 g via INTRAVENOUS
  Filled 2021-09-05: qty 2

## 2021-09-05 MED ORDER — VANCOMYCIN HCL IN DEXTROSE 1-5 GM/200ML-% IV SOLN: 1000.0000 mg | Freq: Once | INTRAVENOUS | Status: DC

## 2021-09-05 MED ORDER — POLYETHYLENE GLYCOL 3350 17 G PO PACK: 17.0000 g | PACK | Freq: Every day | ORAL | Status: DC | PRN

## 2021-09-05 MED ORDER — FLUTICASONE PROPIONATE 50 MCG/ACT NA SUSP
1.0000 | Freq: Every day | NASAL | Status: DC
Start: 2021-09-06 — End: 2021-09-09
  Administered 2021-09-06 – 2021-09-09 (×4): 1 via NASAL
  Filled 2021-09-05: qty 16

## 2021-09-05 MED ORDER — BISACODYL 5 MG PO TBEC: 5.0000 mg | DELAYED_RELEASE_TABLET | Freq: Every day | ORAL | Status: DC | PRN

## 2021-09-05 MED ORDER — VANCOMYCIN HCL IN DEXTROSE 1-5 GM/200ML-% IV SOLN
1000.0000 mg | INTRAVENOUS | Status: DC
Start: 2021-09-06 — End: 2021-09-06

## 2021-09-05 MED ORDER — HEPARIN BOLUS VIA INFUSION
4000.0000 [IU] | Freq: Once | INTRAVENOUS | Status: AC
  Administered 2021-09-06: 4000 [IU] via INTRAVENOUS
  Filled 2021-09-05: qty 4000

## 2021-09-05 MED ORDER — METRONIDAZOLE 500 MG/100ML IV SOLN
500.0000 mg | Freq: Once | INTRAVENOUS | Status: AC
Start: 2021-09-05 — End: 2021-09-05
  Administered 2021-09-05: 500 mg via INTRAVENOUS
  Filled 2021-09-05: qty 100

## 2021-09-05 NOTE — Consult Note (Signed)
PHARMACY -  BRIEF ANTIBIOTIC NOTE   Pharmacy has received consult(s) for Vancomycin and cefepime from an ED provider.  The patient's profile has been reviewed for ht/wt/allergies/indication/available labs.    One time order(s) placed for cefepime 2gm Vancomycin 2000mg   Further antibiotics/pharmacy consults should be ordered by admitting physician if indicated.                       Thank you, Nizhoni Parlow Rodriguez-Guzman PharmD, BCPS 09/05/2021 9:05 PM

## 2021-09-05 NOTE — Assessment & Plan Note (Signed)
With h/o chest pian we will evalaute for PE. Dimer pending.

## 2021-09-05 NOTE — Progress Notes (Signed)
       CROSS COVER NOTE  NAME: [name] MRN: [id number] DOB : 08-13-1950    Date of Service   09/05/21  HPI/Events of Note   Critical troponin 284 reported by nursing. No reports of chest pain at this time. Pt denies nausea, vomiting, dyspnea, back pain, dizziness/lightheadedness, and abdominal pain.  Interventions   Plan: Trend Troponin Heparin per pharmacy Repeat EKG      Neomia Glass DNP, MHA, FNP-BC Nurse Practitioner [facility] Pager [phone]

## 2021-09-05 NOTE — Progress Notes (Signed)
ANTICOAGULATION CONSULT NOTE - Initial Consult  Pharmacy Consult for Heparin  Indication: chest pain/ACS  Allergies  Allergen Reactions   Crestor [Rosuvastatin Calcium] Other (See Comments)    myalgia   Ciprofloxacin    Invokana [Canagliflozin] Other (See Comments)    dehydrated   Prednisone Other (See Comments)   Sulfamethizole     Patient Measurements: Height: 5\' 11"  (180.3 cm) Weight: 104.3 kg (230 lb) IBW/kg (Calculated) : 75.3 Heparin Dosing Weight:  97.2 kg   Vital Signs: Temp: 102.3 F (39.1 C) (06/11 2327) Temp Source: Oral (06/11 2327) BP: 130/83 (06/11 2151) Pulse Rate: 104 (06/11 2330)  Labs: Recent Labs    09/05/21 2045 09/05/21 2233  HGB 13.2  --   HCT 42.5  --   PLT 258  --   APTT 27  --   LABPROT 13.8  --   INR 1.1  --   CREATININE 1.81*  --   TROPONINIHS 13 284*    Estimated Creatinine Clearance: 46.7 mL/min (A) (by C-G formula based on SCr of 1.81 mg/dL (H)).   Medical History: Past Medical History:  Diagnosis Date   Allergy    CAD (coronary artery disease)    a. 01/2019 MV: EF 55-65%, no ischemia; b. 12/2019 NSTEMI/PCI: LM nl, LAD 40p, 76m, D1 40, LCX nl, OM3 40, RCA 90/85p (2.75x34 Resolute Onyx DES), 26m/d.   Chronic headaches    CKD (chronic kidney disease), stage III (HCC)    Edentulous    no lower teeth   GERD (gastroesophageal reflux disease)    Gout    Heart burn    History of echocardiogram    a. 12/2019 Echo: EF 60-65%, no rwma, triv MR.   Hyperlipidemia    Hypertension    Motion sickness    boats   NSTEMI (non-ST elevated myocardial infarction) ([place]) 12/27/2019   Pulmonary embolism ([place]) 05/2017   Type 2 diabetes mellitus (HCC)     Medications:  (Not in a hospital admission)   Assessment: Pharmacy consulted to dose heparin in this 71 year old male admitted with ACS/NSTEMI.  No prior anticoag noted.  CrCl = 46.7 ml/min   Goal of Therapy:  Heparin level 0.3-0.7 units/ml Monitor platelets by anticoagulation  protocol: Yes   Plan:  Give 4000 units bolus x 1 Start heparin infusion at 1300 units/hr Check anti-Xa level in 6 hours and daily while on heparin Continue to monitor H&H and platelets  [name] D 09/05/2021,11:52 PM

## 2021-09-05 NOTE — Assessment & Plan Note (Signed)
Continue asa/ statin/ zetia/

## 2021-09-05 NOTE — Progress Notes (Signed)
Pharmacy Antibiotic Note  [name] is a 71 y.o. male admitted on 09/05/2021 with sepsis.  Pharmacy has been consulted for Vanc, Cefepime , metronidazole dosing.  Plan: Metronidazole 500 mg IV X 1 given in ED on 6/11 @ 2240. Metronidazole 500 mg IV Q12H ordered to start on 6/12 @ 1030.  Cefepime 2 gm IV X 1 given in ED on 6/11 @ 2136. Cefepime 2 gm IV Q12H ordered to continue on 6/12 @ 0930.  Vancomycin 2 gm IV X 1 given in ED on 6/11 @ 2240. Vancomycin 1 gm IV Q24H ordered to start on 6/12 @ 2230.   AUC = 505 Vanc trough = 13.4   Height: 5\' 11"  (180.3 cm) Weight: 104.3 kg (230 lb) IBW/kg (Calculated) : 75.3  Temp (24hrs), Avg:101.5 F (38.6 C), Min:98.1 F (36.7 C), Max:103.2 F (39.6 C)  Recent Labs  Lab 09/05/21 2045  WBC 5.2  CREATININE 1.81*  LATICACIDVEN 5.7*    Estimated Creatinine Clearance: 46.7 mL/min (A) (by C-G formula based on SCr of 1.81 mg/dL (H)).    Allergies  Allergen Reactions   Crestor [Rosuvastatin Calcium] Other (See Comments)    myalgia   Ciprofloxacin    Invokana [Canagliflozin] Other (See Comments)    dehydrated   Prednisone Other (See Comments)   Sulfamethizole     Antimicrobials this admission:   >>    >>   Dose adjustments this admission:   Microbiology results:  BCx:   UCx:    Sputum:    MRSA PCR:   Thank you for allowing pharmacy to be a part of this patient's care.  March Joos D 09/05/2021 11:16 PM

## 2021-09-05 NOTE — Assessment & Plan Note (Signed)
Again patient found to have a urinary tract infection with an abnormal urinalysis. Urinalysis shows some blood nitrite negative more than 50 WBCs. Patient has acute kidney injury on chronic kidney disease, suspect secondary to sepsis and dehydration and infection, we will obtain bilateral renal ultrasound to identify any hydronephrosis.

## 2021-09-05 NOTE — H&P (Signed)
History and Physical    Patient: [name] DOB: Nov 22, 1950 DOA: 09/05/2021 DOS: the patient was seen and examined on 09/05/2021 PCP: [name], DO  Patient coming from: Home  Chief Complaint:  Chief Complaint  Patient presents with   Chest Pain   HPI: [name] is a 71 y.o. male with medical history significant of diabetes mellitus type 2, hypertension, heart disease, pulmonary embolism, GERD, CKD coming to [place] with fevers chills.  Patient also reports chest pain and shortness of breath that started last night at about 8 PM and was midsternal.  Pain described as an ache.  Is febrile and shaking.  Initial blood sugar was 173.  Patient also describes lower abdominal pain with dysuria frequency that is been going on at least for the past 2 weeks.  Patient had vomiting in the emergency room patient met severe sepsis criteria in the emergency room and cultures were collected and started on vancomycin cefepime and Flagyl.  Patient had an elevated lactic of 5.7 and a fever of 103 in the emergency room. Chart review shows patient has allergies to statin, Cipro, Invokana, prednisone, Bactrim.  Review of Systems  Constitutional:  Positive for chills, fever and malaise/fatigue.  Respiratory:  Positive for shortness of breath.   Cardiovascular:  Positive for chest pain.  Gastrointestinal:  Positive for abdominal pain.  Genitourinary:  Positive for dysuria, frequency and urgency.    Past Medical History:  Diagnosis Date   Allergy    CAD (coronary artery disease)    a. 01/2019 MV: EF 55-65%, no ischemia; b. 12/2019 NSTEMI/PCI: LM nl, LAD 40p, 9m D1 40, LCX nl, OM3 40, RCA 90/85p (2.75x34 Resolute Onyx DES), 379m.   Chronic headaches    CKD (chronic kidney disease), stage III (HCC)    Edentulous    no lower teeth   GERD (gastroesophageal reflux disease)    Gout    Heart burn    History of echocardiogram    a. 12/2019 Echo: EF 60-65%, no rwma, triv  MR.   Hyperlipidemia    Hypertension    Motion sickness    boats   [place]I (non-ST elevated [place] infarction) [place]   P[place]   [facility]   Past Surgical History:  Procedure Laterality Date   BACK SURGERY  02/26/2019   CATARACT EXTRACTION W/PHACO Left 04/14/2021   Procedure: CATARACT EXTRACTION PHACO AND INTRAOCULAR LENS PLACEMENT ([place];  Surgeon: BrLeandrew KoyanagiMD;  [facility] Service: Ophthalmology;  Laterality: Left;  11.22 1:25.1   CATARACT EXTRACTION W/PHACO Right 06/09/2021   Procedure: CATARACT EXTRACTION PHACO AND INTRAOCULAR LENS PLACEMENT ([place]ET[place];  Surgeon: BrLeandrew KoyanagiMD;  [place] [facility] Service: Ophthalmology;  Laterality: Right;  Diabetic 4.75 01:05.7   KNEE SURGERY     LEFT HEART CATH AND CORONARY ANGIOGRAPHY N/A 12/30/2019   Procedure: LEFT HEART CATH AND CORONARY ANGIOGRAPHY;  Surgeon: ArWellington HampshireMD;  [place] [facility];  Service: Cardiovascular;  Laterality: N/A;   SHOULDER SURGERY     TONSILLECTOMY     Social History:  reports that he quit smoking about 23 years ago. His smoking use included cigars. His smokeless tobacco use includes chew. He reports that he does not currently use alcohol. He reports that he does not use drugs.  Allergies  Allergen Reactions   Crestor [Rosuvastatin Calcium] Other (See Comments)    myalgia  Ciprofloxacin    Invokana [Canagliflozin] Other (See Comments)    dehydrated   Prednisone Other (See Comments)   Sulfamethizole     Family History  Problem Relation Age of Onset   Cancer Father        lung   Heart disease Sister    Heart disease Brother    Prostate cancer Neg Hx     Prior to Admission medications   Medication Sig Start Date End Date Taking? Authorizing Provider  ACCU-CHEK SMARTVIEW test strip TEST BLOOD SUGAR TWICE DAILY 01/13/21    [name], [name], DO  acetaminophen (TYLENOL) 500 MG tablet Take 1,000 mg by mouth every 8 (eight) hours as needed.     [provider]  allopurinol (ZYLOPRIM) 100 MG tablet TAKE 1 TABLET AT BEDTIME 12/27/20   Karamalegos, [name], DO  aspirin EC 81 MG EC tablet Take 1 tablet (81 mg total) by mouth daily. Swallow whole. 12/31/19   [name], MD  carvedilol (COREG) 12.5 MG tablet TAKE 1 TABLET TWICE DAILY 12/27/20   [name], [name], DO  clopidogrel (PLAVIX) 75 MG tablet Take 1 tablet (75 mg total) by mouth daily. 03/23/21   [name], MD  DROPLET PEN NEEDLES 32G X 4 MM MISC USE WITH [name] INSULIN INJECTION DAILY AS INSTRUCTED 06/09/21   [name], [name], DO  ezetimibe (ZETIA) 10 MG tablet TAKE 1 TABLET EVERY DAY 03/30/21   Karamalegos, [name], DO  fluticasone (FLONASE) 50 MCG/ACT nasal spray USE 2 SPRAYS IN EACH NOSTRIL ONCE DAILY USE FOR 4 TO 6 WEEKS, THEN STOP AND USE SEASONALLY OR AS NEEDED. 07/06/20   [name], [name], DO  Insulin Glargine ([name] KWIKPEN Long) Inject 38 Units into the skin.    [provider]  isosorbide mononitrate (IMDUR) 30 MG 24 hr tablet Take 0.5 tablets (15 mg total) by mouth daily. 03/23/21 08/12/22  [name], MD  lisinopril (ZESTRIL) 40 MG tablet TAKE 1 TABLET EVERY DAY 12/27/20   Karamalegos, [name], DO  MITIGARE 0.6 MG CAPS Take for gout flare start Day 1 with 2 tablets then may take 1 extra tablet in 1 hour. Starting Day 2 take 1 tablet daily for up to 7-10 days until resolve. May increase to 1 tablet twice a day max dose if needed. Patient not taking: Reported on 06/14/2021 03/25/21   [name], DO  nitroGLYCERIN (NITROSTAT) 0.4 MG SL tablet Place 1 tablet (0.4 mg total) under the tongue every 5 (five) minutes as needed for chest pain. Patient not taking: Reported on 06/14/2021 12/31/19   [name], MD  pantoprazole (PROTONIX) 20 MG tablet Take 1 tablet (20 mg  total) by mouth 2 (two) times daily before a meal. 05/24/21   [name], [name], DO  RYBELSUS 14 MG TABS Take 1 tablet (14 mg total) by mouth daily before breakfast. 06/09/21   [name], DO    Physical Exam: Vitals:   09/05/21 2058 09/05/21 2100 09/05/21 2142 09/05/21 2151  BP:  (!) 151/66  130/83  Pulse:  (!) 106  (!) 56  Resp:  (!) 28  15  Temp: (!) 103.2 F (39.6 C)  (!) 103.1 F (39.5 C)   TempSrc: Oral  Oral   SpO2:  96%  97%  Weight:      Height:       Physical Exam Vitals and nursing note reviewed.  Constitutional:      General: He is not in acute distress.    Appearance: Normal  appearance. He is obese. He is ill-appearing. He is not toxic-appearing or diaphoretic.  HENT:     Head: Normocephalic and atraumatic.     Right Ear: Hearing and external ear normal.     Left Ear: Hearing and external ear normal.     Nose: Nose normal. No nasal deformity.     Mouth/Throat:     Lips: Pink.     Mouth: Mucous membranes are moist.     Tongue: No lesions.     Pharynx: Oropharynx is clear.  Eyes:     Extraocular Movements: Extraocular movements intact.     Pupils: Pupils are equal, round, and reactive to light.  Cardiovascular:     Rate and Rhythm: Normal rate and regular rhythm.     Pulses: Normal pulses.     Heart sounds: Normal heart sounds.  Pulmonary:     Effort: Pulmonary effort is normal.     Breath sounds: Normal breath sounds.  Abdominal:     General: Bowel sounds are normal. There is no distension.     Palpations: Abdomen is soft. There is no mass.     Tenderness: There is abdominal tenderness in the right lower quadrant, suprapubic area and left lower quadrant. There is no guarding.     Hernia: No hernia is present.    Musculoskeletal:     Right lower leg: No edema.     Left lower leg: No edema.  Skin:    General: Skin is cool.  Neurological:     General: No focal deficit present.     Mental Status: He is alert and oriented to person,  place, and time.     Cranial Nerves: Cranial nerves 2-12 are intact.     Motor: Motor function is intact.  Psychiatric:        Attention and Perception: Attention normal.        Mood and Affect: Mood normal.        Speech: Speech normal.        Behavior: Behavior normal. Behavior is cooperative.     Data Reviewed: Results for orders placed or performed during the hospital encounter of 09/05/21 (from the past 24 hour(s))  CBG monitoring, ED     Status: Abnormal   Collection Time: 09/05/21  8:36 PM  Result Value Ref Range   Glucose-Capillary 173 (H) 70 - 99 mg/dL  Basic metabolic panel     Status: Abnormal   Collection Time: 09/05/21  8:45 PM  Result Value Ref Range   Sodium 142 135 - 145 mmol/L   Potassium 5.0 3.5 - 5.1 mmol/L   Chloride 106 98 - 111 mmol/L   CO2 23 22 - 32 mmol/L   Glucose, Bld 167 (H) 70 - 99 mg/dL   BUN 19 8 - 23 mg/dL   Creatinine, Ser 1.81 (H) 0.61 - 1.24 mg/dL   Calcium 9.3 8.9 - 10.3 mg/dL   GFR, Estimated 40 (L) >60 mL/min   Anion gap 13 5 - 15  CBC     Status: None   Collection Time: 09/05/21  8:45 PM  Result Value Ref Range   WBC 5.2 4.0 - 10.5 K/uL   RBC 4.63 4.22 - 5.81 MIL/uL   Hemoglobin 13.2 13.0 - 17.0 g/dL   HCT 42.5 39.0 - 52.0 %   MCV 91.8 80.0 - 100.0 fL   MCH 28.5 26.0 - 34.0 pg   MCHC 31.1 30.0 - 36.0 g/dL   RDW 15.2 11.5 - 15.5 %  Platelets 258 150 - 400 K/uL   nRBC 0.0 0.0 - 0.2 %  Troponin I (High Sensitivity)     Status: None   Collection Time: 09/05/21  8:45 PM  Result Value Ref Range   Troponin I (High Sensitivity) 13 <18 ng/L  Protime-INR (order if Patient is taking Coumadin / Warfarin)     Status: None   Collection Time: 09/05/21  8:45 PM  Result Value Ref Range   Prothrombin Time 13.8 11.4 - 15.2 seconds   INR 1.1 0.8 - 1.2  Lactic acid, plasma     Status: Abnormal   Collection Time: 09/05/21  8:45 PM  Result Value Ref Range   Lactic Acid, Venous 5.7 (HH) 0.5 - 1.9 mmol/L  APTT     Status: None   Collection Time:  09/05/21  8:45 PM  Result Value Ref Range   aPTT 27 24 - 36 seconds  Hepatic function panel     Status: Abnormal   Collection Time: 09/05/21  8:45 PM  Result Value Ref Range   Total Protein 8.3 (H) 6.5 - 8.1 g/dL   Albumin 4.1 3.5 - 5.0 g/dL   AST 31 15 - 41 U/L   ALT 20 0 - 44 U/L   Alkaline Phosphatase 70 38 - 126 U/L   Total Bilirubin 1.3 (H) 0.3 - 1.2 mg/dL   Bilirubin, Direct 0.3 (H) 0.0 - 0.2 mg/dL   Indirect Bilirubin 1.0 (H) 0.3 - 0.9 mg/dL  Urinalysis, Complete w Microscopic Anterior Nasal Swab     Status: Abnormal   Collection Time: 09/05/21  8:52 PM  Result Value Ref Range   Color, Urine YELLOW (A) YELLOW   APPearance HAZY (A) CLEAR   Specific Gravity, Urine 1.014 1.005 - 1.030   pH 6.0 5.0 - 8.0   Glucose, UA 150 (A) NEGATIVE mg/dL   Hgb urine dipstick SMALL (A) NEGATIVE   Bilirubin Urine NEGATIVE NEGATIVE   Ketones, ur NEGATIVE NEGATIVE mg/dL   Protein, ur >=300 (A) NEGATIVE mg/dL   Nitrite NEGATIVE NEGATIVE   Leukocytes,Ua SMALL (A) NEGATIVE   RBC / HPF 6-10 0 - 5 RBC/hpf   WBC, UA >50 (H) 0 - 5 WBC/hpf   Bacteria, UA MANY (A) NONE SEEN   Squamous Epithelial / LPF 0-5 0 - 5   WBC Clumps PRESENT    Mucus PRESENT      Assessment and Plan: * Severe sepsis ([place]) Patient presenting with fevers chills abdominal pain found to have urinary tract infection. Supportive care with IV fluids, antiemetics and antipyretics as needed. We will continue patient on vancomycin and cefepime along with Flagyl. Monitor liver function test renal function tests. Monitor for any organ dysfunction. We will follow culture sensitivity and tailor antibiotic regimen.   Sepsis secondary to UTI [facility]) Again patient found to have a urinary tract infection with an abnormal urinalysis. Urinalysis shows some blood nitrite negative more than 50 WBCs. Patient has acute kidney injury on chronic kidney disease, suspect secondary to sepsis and dehydration and infection, we will obtain bilateral  renal ultrasound to identify any hydronephrosis.  Chest pain We will follow troponin. Continue plavix.  ekg pending.   History of pulmonary embolus (PE) With h/o chest pian we will evalaute for PE. Dimer pending.   Type 2 diabetes mellitus with other specified complication (HCC) NPO except for meds.  Iv ppi. Glycemic protocol.   Hyperlipidemia associated with type 2 diabetes mellitus ([place]) Continue asa/ statin/ zetia/   Benign hypertension with CKD (chronic  kidney disease) stage III (HCC) AKI on ckd stage 3. Lab Results  Component Value Date   CREATININE 1.81 (H) 09/05/2021   CREATININE 1.42 (H) 05/02/2021   CREATININE 1.57 (H) 11/03/2020  we will avoid contrast studies and renally dose all needed abx and meds.  Hold lisinopril . Cont Imdur.  PRN hydralazine.     Advance Care Planning:    Code Status: Full Code   Consults:  None   Family Communication:  Schendel,Susan E (Spouse)  [phone] ([place])  Severity of Illness: The appropriate patient status for this patient is INPATIENT. Inpatient status is judged to be reasonable and necessary in order to provide the required intensity of service to ensure the patient's safety. The patient's presenting symptoms, physical exam findings, and initial radiographic and laboratory data in the context of their chronic comorbidities is felt to place them at high risk for further clinical deterioration. Furthermore, it is not anticipated that the patient will be medically stable for discharge from the hospital within 2 midnights of admission.   * I certify that at the point of admission it is my clinical judgment that the patient will require inpatient hospital care spanning beyond 2 midnights from the point of admission due to high intensity of service, high risk for further deterioration and high frequency of surveillance required.*  Author: Para Skeans, MD 09/05/2021 10:26 PM  For on call review [email or web address].

## 2021-09-05 NOTE — Assessment & Plan Note (Addendum)
We will follow troponin. Continue plavix.  ekg pending.

## 2021-09-05 NOTE — ED Triage Notes (Signed)
Ambulatory to triage with c/o chills, shaking, and chest pain and sob. Chest pain onset apx 8pm tonight. Midsternal. Pt can only state pain feels like an ache. Pt shaking uncontrollably in triage. Unable to get accurate EKG in triage due to pt shaking. CBG 173.  Also c/o groin pain, pain with urination and urinary frequency.

## 2021-09-05 NOTE — Consult Note (Signed)
CODE SEPSIS - PHARMACY COMMUNICATION  **Broad Spectrum Antibiotics should be administered within 1 hour of Sepsis diagnosis**  Time Code Sepsis Called/Page Received: 2106  Antibiotics Ordered: cefepime, metronidazole, Vancomycin  Time of 1st antibiotic administration: 2136  Additional action taken by pharmacy: none  If necessary, Name of Provider/Nurse Contacted: na   [place] Jeff Rodriguez-Guzman PharmD, BCPS 09/05/2021 9:06 PM

## 2021-09-05 NOTE — Assessment & Plan Note (Addendum)
AKI on ckd stage 3. Lab Results  Component Value Date   CREATININE 1.81 (H) 09/05/2021   CREATININE 1.42 (H) 05/02/2021   CREATININE 1.57 (H) 11/03/2020  we will avoid contrast studies and renally dose all needed abx and meds.  Hold lisinopril . Cont Imdur.  PRN hydralazine.

## 2021-09-05 NOTE — ED Provider Notes (Signed)
[facility] Provider Note    Event Date/Time   First MD Initiated Contact with Patient 09/05/21 2057     (approximate)  History   Chief Complaint: Chest Pain  HPI  Jibril Mcminn is a 71 y.o. male with a past medical history of CAD, CKD, gastric reflux, hypertension, hyperlipidemia, diabetes, presents to the emergency department for weakness, rigors, urinary frequency and lower abdominal discomfort.  Upon arrival patient began actively vomiting.  Complaining lower abdominal pain nausea generalized weakness.  Patient found to be febrile to 103 in the emergency department as well as tachycardic and tachypneic.  Wife reports over the past 3 days patient has been urinating very frequently.  Patient denies dysuria.  Denies known fever.  No cough.  Physical Exam   Triage Vital Signs: ED Triage Vitals  Enc Vitals Group     BP 09/05/21 2038 (!) 166/153     Pulse Rate 09/05/21 2038 (!) 116     Resp 09/05/21 2038 (!) 30     Temp 09/05/21 2038 98.1 F (36.7 C)     Temp Source 09/05/21 2038 Oral     SpO2 09/05/21 2038 94 %     Weight 09/05/21 2039 230 lb (104.3 kg)     Height 09/05/21 2039 5\' 11"  (1.803 m)     Head Circumference --      Peak Flow --      Pain Score 09/05/21 2039 10     Pain Loc --      Pain Edu? --      Excl. in GC? --     Most recent vital signs: Vitals:   09/05/21 2038  BP: (!) 166/153  Pulse: (!) 116  Resp: (!) 30  Temp: 98.1 F (36.7 C)  SpO2: 94%    General: Awake, no distress.  CV:  Good peripheral perfusion.  Regular rate and rhythm  Resp:  Normal effort.  Equal breath sounds bilaterally.  Abd:  No distention.  Soft, mild suprapubic tenderness.  No rebound or guarding   ED Results / Procedures / Treatments   EKG  EKG viewed and interpreted by myself shows what is likely a sinus rhythm at 109 bpm with a narrow QRS, left axis deviation, nonspecific ST changes.  Significant electrical interference due to the patient  shaking.  RADIOLOGY  I have personally interpreted the chest x-ray images no acute abnormality seen on my evaluation. Chest x-ray is negative for acute abnormality.   MEDICATIONS ORDERED IN ED: Medications - No data to display   IMPRESSION / MDM / ASSESSMENT AND PLAN / ED COURSE  I reviewed the triage vital signs and the nursing notes.  Patient's presentation is most consistent with acute presentation with potential threat to life or bodily function.  Patient presents to the emergency department for urinary frequency nausea vomiting weakness found to be febrile to 103 in the emergency department tachycardic tachypneic meeting sepsis criteria.  We will check labs, cultures, start broad-spectrum antibiotics.  Differential would include infectious etiology such as urinary tract infection, pyelonephritis, COVID, pneumonia, highly suspect urinary source given his symptoms.  We will IV hydrate, treat nausea while awaiting results.  Patient's urinalysis has resulted showing greater than 50 white cells with many bacteria consistent with urinary tract infection.  Chemistry shows renal insufficiency however largely unchanged from baseline.  CBC shows normal results with a normal white blood cell count.  Patient's lactate is elevated at 5.7.  I have increased IV fluids to 3200  mL total which is 30 mL/kg.  Chest x-ray is negative.   CRITICAL CARE Performed by: Minna Antis   Total critical care time: 30 minutes  Critical care time was exclusive of separately billable procedures and treating other patients.  Critical care was necessary to treat or prevent imminent or life-threatening deterioration.  Critical care was time spent personally by me on the following activities: development of treatment plan with patient and/or surrogate as well as nursing, discussions with consultants, evaluation of patient's response to treatment, examination of patient, obtaining history from patient or  surrogate, ordering and performing treatments and interventions, ordering and review of laboratory studies, ordering and review of radiographic studies, pulse oximetry and re-evaluation of patient's condition.   FINAL CLINICAL IMPRESSION(S) / ED DIAGNOSES   Sepsis Urinary tract infection  Note:  This document was prepared using Dragon voice recognition software and may include unintentional dictation errors.   Minna Antis, MD 09/05/21 2230

## 2021-09-05 NOTE — ED Notes (Signed)
Pt to room where they vomitting and continued to shake.

## 2021-09-05 NOTE — ED Notes (Signed)
Trop 284 per lab. MD Allena Katz and Foust notfied.

## 2021-09-05 NOTE — Assessment & Plan Note (Signed)
Patient presenting with fevers chills abdominal pain found to have urinary tract infection. Supportive care with IV fluids, antiemetics and antipyretics as needed. We will continue patient on vancomycin and cefepime along with Flagyl. Monitor liver function test renal function tests. Monitor for any organ dysfunction. We will follow culture sensitivity and tailor antibiotic regimen.

## 2021-09-05 NOTE — Sepsis Progress Note (Signed)
Elink following for Sepsis Protocol 

## 2021-09-05 NOTE — ED Notes (Signed)
Lactic of 5.7 called from lab.  EDP Paduchowski notified.

## 2021-09-05 NOTE — Assessment & Plan Note (Signed)
NPO except for meds.  Iv ppi. Glycemic protocol.

## 2021-09-06 ENCOUNTER — Encounter: Payer: Self-pay | Admitting: Internal Medicine

## 2021-09-06 ENCOUNTER — Inpatient Hospital Stay: Admit: 2021-09-06 | Payer: Medicare HMO

## 2021-09-06 DIAGNOSIS — I248 Other forms of acute ischemic heart disease: Secondary | ICD-10-CM | POA: Diagnosis not present

## 2021-09-06 DIAGNOSIS — E785 Hyperlipidemia, unspecified: Secondary | ICD-10-CM

## 2021-09-06 DIAGNOSIS — B9620 Unspecified Escherichia coli [E. coli] as the cause of diseases classified elsewhere: Secondary | ICD-10-CM

## 2021-09-06 DIAGNOSIS — I10 Essential (primary) hypertension: Secondary | ICD-10-CM

## 2021-09-06 DIAGNOSIS — R7881 Bacteremia: Secondary | ICD-10-CM

## 2021-09-06 DIAGNOSIS — R748 Abnormal levels of other serum enzymes: Secondary | ICD-10-CM

## 2021-09-06 DIAGNOSIS — I2510 Atherosclerotic heart disease of native coronary artery without angina pectoris: Secondary | ICD-10-CM

## 2021-09-06 LAB — BLOOD CULTURE ID PANEL (REFLEXED) - BCID2

## 2021-09-06 LAB — GLUCOSE, CAPILLARY
Glucose-Capillary: 144 mg/dL — ABNORMAL HIGH (ref 70–99)
Glucose-Capillary: 193 mg/dL — ABNORMAL HIGH (ref 70–99)
Glucose-Capillary: 156 mg/dL — ABNORMAL HIGH (ref 70–99)

## 2021-09-06 LAB — HEPARIN LEVEL (UNFRACTIONATED)
Heparin Unfractionated: 0.49 IU/mL (ref 0.30–0.70)
Heparin Unfractionated: 0.51 IU/mL (ref 0.30–0.70)

## 2021-09-06 LAB — TROPONIN I (HIGH SENSITIVITY)
Troponin I (High Sensitivity): 844 ng/L (ref <18)
Troponin I (High Sensitivity): 1203 ng/L (ref <18)
Troponin I (High Sensitivity): 1479 ng/L (ref <18)

## 2021-09-06 LAB — PROCALCITONIN: Procalcitonin: 24.87 ng/mL

## 2021-09-06 LAB — COLOGUARD: COLOGUARD: NEGATIVE

## 2021-09-06 LAB — HIV ANTIBODY (ROUTINE TESTING W REFLEX): HIV Screen 4th Generation wRfx: NONREACTIVE

## 2021-09-06 LAB — CORTISOL-AM, BLOOD: Cortisol - AM: 14.1 ug/dL (ref 6.7–22.6)

## 2021-09-06 MED ORDER — ONDANSETRON HCL 4 MG/2ML IJ SOLN: 4.0000 mg | Freq: Three times a day (TID) | INTRAMUSCULAR | Status: DC | PRN

## 2021-09-06 MED ORDER — INSULIN ASPART 100 UNIT/ML IJ SOLN
0.0000 [IU] | Freq: Three times a day (TID) | INTRAMUSCULAR | Status: DC
  Administered 2021-09-06: 3 [IU] via SUBCUTANEOUS
  Administered 2021-09-07: 5 [IU] via SUBCUTANEOUS
  Administered 2021-09-07: 2 [IU] via SUBCUTANEOUS
  Administered 2021-09-07: 5 [IU] via SUBCUTANEOUS
  Administered 2021-09-08: 2 [IU] via SUBCUTANEOUS
  Administered 2021-09-08: 5 [IU] via SUBCUTANEOUS
  Administered 2021-09-08 – 2021-09-09 (×2): 3 [IU] via SUBCUTANEOUS
  Filled 2021-09-06 (×8): qty 1

## 2021-09-06 MED ORDER — SODIUM CHLORIDE 0.9 % IV SOLN
2.0000 g | INTRAVENOUS | Status: DC
  Administered 2021-09-07 – 2021-09-08 (×2): 2 g via INTRAVENOUS
  Filled 2021-09-06: qty 20
  Filled 2021-09-06: qty 2

## 2021-09-06 MED ORDER — ACETAMINOPHEN 325 MG PO TABS
650.0000 mg | ORAL_TABLET | Freq: Four times a day (QID) | ORAL | Status: DC | PRN
  Administered 2021-09-06 – 2021-09-07 (×2): 650 mg via ORAL
  Filled 2021-09-06 (×3): qty 2

## 2021-09-06 MED ORDER — INSULIN GLARGINE-YFGN 100 UNIT/ML ~~LOC~~ SOLN
18.0000 [IU] | Freq: Every day | SUBCUTANEOUS | Status: DC
  Administered 2021-09-06: 18 [IU] via SUBCUTANEOUS
  Filled 2021-09-06 (×2): qty 0.18

## 2021-09-06 MED ORDER — SODIUM CHLORIDE 0.9 % IV SOLN
1.0000 g | INTRAVENOUS | Status: DC
  Administered 2021-09-06: 1 g via INTRAVENOUS
  Filled 2021-09-06: qty 1
  Filled 2021-09-06: qty 10

## 2021-09-06 NOTE — Progress Notes (Signed)
ANTICOAGULATION CONSULT NOTE - Initial Consult  Pharmacy Consult for Heparin  Indication: chest pain/ACS  Allergies  Allergen Reactions   Crestor [Rosuvastatin Calcium] Other (See Comments)    myalgia   Ciprofloxacin    Invokana [Canagliflozin] Other (See Comments)    dehydrated   Prednisone Other (See Comments)   Sulfamethizole     Patient Measurements: Height: 5\' 11"  (180.3 cm) Weight: 104.3 kg (230 lb) IBW/kg (Calculated) : 75.3 Heparin Dosing Weight:  97.2 kg   Vital Signs: Temp: 98.4 F (36.9 C) (06/12 1143) Temp Source: Oral (06/12 1143) BP: 122/60 (06/12 1143) Pulse Rate: 64 (06/12 1143)  Labs: Recent Labs    09/05/21 2045 09/05/21 2233 09/06/21 0030 09/06/21 0648 09/06/21 0946 09/06/21 1243  HGB 13.2  --   --   --   --   --   HCT 42.5  --   --   --   --   --   PLT 258  --   --   --   --   --   APTT 27  --   --   --   --   --   LABPROT 13.8  --   --   --   --   --   INR 1.1  --   --   --   --   --   HEPARINUNFRC  --   --   --  0.49  --  0.51  CREATININE 1.81*  --   --   --   --   --   TROPONINIHS 13   < > 844* 1,203* 1,479*  --    < > = values in this interval not displayed.     Estimated Creatinine Clearance: 46.7 mL/min (A) (by C-G formula based on SCr of 1.81 mg/dL (H)).   Medical History: Past Medical History:  Diagnosis Date   Allergy    CAD (coronary artery disease)    a. 01/2019 MV: EF 55-65%, no ischemia; b. 12/2019 NSTEMI/PCI: LM nl, LAD 40p, 44m, D1 40, LCX nl, OM3 40, RCA 90/85p (2.75x34 Resolute Onyx DES), 81m/d.   Chronic headaches    CKD (chronic kidney disease), stage III (HCC)    Edentulous    no lower teeth   GERD (gastroesophageal reflux disease)    Gout    Heart burn    History of echocardiogram    a. 12/2019 Echo: EF 60-65%, no rwma, triv MR.   Hyperlipidemia    Hypertension    Motion sickness    boats   NSTEMI (non-ST elevated myocardial infarction) ([place]) 12/27/2019   Pulmonary embolism ([place]) 05/2017   Type 2  diabetes mellitus (HCC)     Medications:  Medications Prior to Admission  Medication Sig Dispense Refill Last Dose   allopurinol (ZYLOPRIM) 100 MG tablet TAKE 1 TABLET AT BEDTIME 90 tablet 3 Past Week   aspirin EC 81 MG EC tablet Take 1 tablet (81 mg total) by mouth daily. Swallow whole. 30 tablet 0 09/05/2021   carvedilol (COREG) 12.5 MG tablet TAKE 1 TABLET TWICE DAILY 180 tablet 3 09/05/2021   clopidogrel (PLAVIX) 75 MG tablet Take 1 tablet (75 mg total) by mouth daily. 90 tablet 3 09/05/2021   ezetimibe (ZETIA) 10 MG tablet TAKE 1 TABLET EVERY DAY 90 tablet 3 09/05/2021   fluticasone (FLONASE) 50 MCG/ACT nasal spray USE 2 SPRAYS IN EACH NOSTRIL ONCE DAILY USE FOR 4 TO 6 WEEKS, THEN STOP AND USE SEASONALLY OR AS NEEDED. 48 g  0 prn   isosorbide mononitrate (IMDUR) 30 MG 24 hr tablet Take 0.5 tablets (15 mg total) by mouth daily. 45 tablet 3 09/05/2021   lisinopril (ZESTRIL) 40 MG tablet TAKE 1 TABLET EVERY DAY 90 tablet 3 09/05/2021   nitroGLYCERIN (NITROSTAT) 0.4 MG SL tablet Place 1 tablet (0.4 mg total) under the tongue every 5 (five) minutes as needed for chest pain. 30 tablet 0 prn   pantoprazole (PROTONIX) 20 MG tablet Take 1 tablet (20 mg total) by mouth 2 (two) times daily before a meal. 90 tablet 3 09/05/2021   RYBELSUS 14 MG TABS Take 1 tablet (14 mg total) by mouth daily before breakfast. 30 tablet 0 09/05/2021   ACCU-CHEK SMARTVIEW test strip TEST BLOOD SUGAR TWICE DAILY 200 strip 0    acetaminophen (TYLENOL) 500 MG tablet Take 1,000 mg by mouth every 8 (eight) hours as needed.    prn   DROPLET PEN NEEDLES 32G X 4 MM MISC USE WITH [name] INSULIN INJECTION DAILY AS INSTRUCTED 100 each 1    Insulin Glargine ([name] KWIKPEN [place]) Inject 36 Units into the skin at bedtime.   09/04/2021   MITIGARE 0.6 MG CAPS Take for gout flare start Day 1 with 2 tablets then may take 1 extra tablet in 1 hour. Starting Day 2 take 1 tablet daily for up to 7-10 days until resolve. May increase to 1 tablet twice  a day max dose if needed. (Patient not taking: Reported on 06/14/2021) 30 capsule 2 Not Taking    Assessment: Pharmacy consulted to dose heparin in this 71 year old male admitted with ACS/NSTEMI.  No prior anticoag noted.  CrCl = 46.7 ml/min   6/12@0648 : HL 0.49, therapeutic x 1  Goal of Therapy:  Heparin level 0.3-0.7 units/ml Monitor platelets by anticoagulation protocol: Yes   Plan:  6/12@1243 : HL 0.51, therapeutic x 2 Continue heparin infusion at 1300 units/hr Recheck HL with AM labs Continue to monitor H&H and platelets  Vi Biddinger A Zayanna Pundt 09/06/2021,2:44 PM

## 2021-09-06 NOTE — Assessment & Plan Note (Addendum)
--  trop normal on presentation, trended up to 1400's the next day. --started on heparin gtt Plan: --defer catheterization until bacteremia has been adequately treated --cont heparin gtt for 48 hours total --cont plavix

## 2021-09-06 NOTE — Progress Notes (Signed)
ANTICOAGULATION CONSULT NOTE - Initial Consult  Pharmacy Consult for Heparin  Indication: chest pain/ACS  Allergies  Allergen Reactions   Crestor [Rosuvastatin Calcium] Other (See Comments)    myalgia   Ciprofloxacin    Invokana [Canagliflozin] Other (See Comments)    dehydrated   Prednisone Other (See Comments)   Sulfamethizole     Patient Measurements: Height: 5\' 11"  (180.3 cm) Weight: 104.3 kg (230 lb) IBW/kg (Calculated) : 75.3 Heparin Dosing Weight:  97.2 kg   Vital Signs: Temp: 97.9 F (36.6 C) (06/12 0736) Temp Source: Oral (06/12 0736) BP: 119/71 (06/12 0736) Pulse Rate: 66 (06/12 0736)  Labs: Recent Labs    09/05/21 2045 09/05/21 2233 09/06/21 0030 09/06/21 0648  HGB 13.2  --   --   --   HCT 42.5  --   --   --   PLT 258  --   --   --   APTT 27  --   --   --   LABPROT 13.8  --   --   --   INR 1.1  --   --   --   HEPARINUNFRC  --   --   --  0.49  CREATININE 1.81*  --   --   --   TROPONINIHS 13 284* 844* 1,203*     Estimated Creatinine Clearance: 46.7 mL/min (A) (by C-G formula based on SCr of 1.81 mg/dL (H)).   Medical History: Past Medical History:  Diagnosis Date   Allergy    CAD (coronary artery disease)    a. 01/2019 MV: EF 55-65%, no ischemia; b. 12/2019 NSTEMI/PCI: LM nl, LAD 40p, 27m, D1 40, LCX nl, OM3 40, RCA 90/85p (2.75x34 Resolute Onyx DES), 58m/d.   Chronic headaches    CKD (chronic kidney disease), stage III (HCC)    Edentulous    no lower teeth   GERD (gastroesophageal reflux disease)    Gout    Heart burn    History of echocardiogram    a. 12/2019 Echo: EF 60-65%, no rwma, triv MR.   Hyperlipidemia    Hypertension    Motion sickness    boats   NSTEMI (non-ST elevated myocardial infarction) ([place]) 12/27/2019   Pulmonary embolism ([place]) 05/2017   Type 2 diabetes mellitus (HCC)     Medications:  Medications Prior to Admission  Medication Sig Dispense Refill Last Dose   ACCU-CHEK SMARTVIEW test strip TEST BLOOD SUGAR TWICE  DAILY 200 strip 0    acetaminophen (TYLENOL) 500 MG tablet Take 1,000 mg by mouth every 8 (eight) hours as needed.       allopurinol (ZYLOPRIM) 100 MG tablet TAKE 1 TABLET AT BEDTIME 90 tablet 3    aspirin EC 81 MG EC tablet Take 1 tablet (81 mg total) by mouth daily. Swallow whole. 30 tablet 0    carvedilol (COREG) 12.5 MG tablet TAKE 1 TABLET TWICE DAILY 180 tablet 3    clopidogrel (PLAVIX) 75 MG tablet Take 1 tablet (75 mg total) by mouth daily. 90 tablet 3    DROPLET PEN NEEDLES 32G X 4 MM MISC USE WITH [name] INSULIN INJECTION DAILY AS INSTRUCTED 100 each 1    ezetimibe (ZETIA) 10 MG tablet TAKE 1 TABLET EVERY DAY 90 tablet 3    fluticasone (FLONASE) 50 MCG/ACT nasal spray USE 2 SPRAYS IN EACH NOSTRIL ONCE DAILY USE FOR 4 TO 6 WEEKS, THEN STOP AND USE SEASONALLY OR AS NEEDED. 48 g 0    Insulin Glargine ([name] KWIKPEN [place]) Inject  38 Units into the skin.      isosorbide mononitrate (IMDUR) 30 MG 24 hr tablet Take 0.5 tablets (15 mg total) by mouth daily. 45 tablet 3    lisinopril (ZESTRIL) 40 MG tablet TAKE 1 TABLET EVERY DAY 90 tablet 3    MITIGARE 0.6 MG CAPS Take for gout flare start Day 1 with 2 tablets then may take 1 extra tablet in 1 hour. Starting Day 2 take 1 tablet daily for up to 7-10 days until resolve. May increase to 1 tablet twice a day max dose if needed. (Patient not taking: Reported on 06/14/2021) 30 capsule 2    nitroGLYCERIN (NITROSTAT) 0.4 MG SL tablet Place 1 tablet (0.4 mg total) under the tongue every 5 (five) minutes as needed for chest pain. (Patient not taking: Reported on 06/14/2021) 30 tablet 0    pantoprazole (PROTONIX) 20 MG tablet Take 1 tablet (20 mg total) by mouth 2 (two) times daily before a meal. 90 tablet 3    RYBELSUS 14 MG TABS Take 1 tablet (14 mg total) by mouth daily before breakfast. 30 tablet 0     Assessment: Pharmacy consulted to dose heparin in this 71 year old male admitted with ACS/NSTEMI.  No prior anticoag noted.  CrCl = 46.7 ml/min    Goal of Therapy:  Heparin level 0.3-0.7 units/ml Monitor platelets by anticoagulation protocol: Yes   Plan:  6/12@0648 : HL 0.49, therapeutic x 1 Continue heparin infusion at 1300 units/hr Check confirmatory anti-Xa level in 6 hours and daily while on heparin Continue to monitor H&H and platelets  Willmer Fellers A Liliana Dang 09/06/2021,9:20 AM

## 2021-09-06 NOTE — Progress Notes (Signed)
PHARMACY - PHYSICIAN COMMUNICATION CRITICAL VALUE ALERT - BLOOD CULTURE IDENTIFICATION (BCID)  [name] is an 71 y.o. male who presented to [facility] on 09/05/2021 with a chief complaint of chest pain  Assessment:  blood culture from 6/11 with GNR 1 of 2 sets, BCID detects E Coli. Likely source is UTI. Renal US without hydronephrosis  Name of physician (or Provider) Contacted: Dr Fran Lowes  Current antibiotics: ceftriaxone  Changes to prescribed antibiotics recommended:  Patient is on recommended antibiotics - No changes needed - increase ceftriaxone dose to 2gm  Results for orders placed or performed during the hospital encounter of 09/05/21  Blood Culture ID Panel (Reflexed) (Collected: 09/05/2021  9:04 PM)  Result Value Ref Range   Enterococcus faecalis NOT DETECTED NOT DETECTED   Enterococcus Faecium NOT DETECTED NOT DETECTED   Listeria monocytogenes NOT DETECTED NOT DETECTED   Staphylococcus species NOT DETECTED NOT DETECTED   Staphylococcus aureus (BCID) NOT DETECTED NOT DETECTED   Staphylococcus epidermidis NOT DETECTED NOT DETECTED   Staphylococcus lugdunensis NOT DETECTED NOT DETECTED   Streptococcus species NOT DETECTED NOT DETECTED   Streptococcus agalactiae NOT DETECTED NOT DETECTED   Streptococcus pneumoniae NOT DETECTED NOT DETECTED   Streptococcus pyogenes NOT DETECTED NOT DETECTED   A.calcoaceticus-baumannii NOT DETECTED NOT DETECTED   Bacteroides fragilis NOT DETECTED NOT DETECTED   Enterobacterales DETECTED (A) NOT DETECTED   Enterobacter cloacae complex NOT DETECTED NOT DETECTED   Escherichia coli DETECTED (A) NOT DETECTED   Klebsiella aerogenes NOT DETECTED NOT DETECTED   Klebsiella oxytoca NOT DETECTED NOT DETECTED   Klebsiella pneumoniae NOT DETECTED NOT DETECTED   Proteus species NOT DETECTED NOT DETECTED   Salmonella species NOT DETECTED NOT DETECTED   Serratia marcescens NOT DETECTED NOT DETECTED   Haemophilus influenzae NOT DETECTED NOT DETECTED    Neisseria meningitidis NOT DETECTED NOT DETECTED   Pseudomonas aeruginosa NOT DETECTED NOT DETECTED   Stenotrophomonas maltophilia NOT DETECTED NOT DETECTED   Candida albicans NOT DETECTED NOT DETECTED   Candida auris NOT DETECTED NOT DETECTED   Candida glabrata NOT DETECTED NOT DETECTED   Candida krusei NOT DETECTED NOT DETECTED   Candida parapsilosis NOT DETECTED NOT DETECTED   Candida tropicalis NOT DETECTED NOT DETECTED   Cryptococcus neoformans/gattii NOT DETECTED NOT DETECTED   CTX-M ESBL NOT DETECTED NOT DETECTED   Carbapenem resistance IMP NOT DETECTED NOT DETECTED   Carbapenem resistance KPC NOT DETECTED NOT DETECTED   Carbapenem resistance NDM NOT DETECTED NOT DETECTED   Carbapenem resist OXA 48 LIKE NOT DETECTED NOT DETECTED   Carbapenem resistance VIM NOT DETECTED NOT DETECTED    Juliette Alcide, PharmD, BCPS, BCIDP Work Cell: [phone] 09/06/2021 12:37 PM

## 2021-09-06 NOTE — Consult Note (Signed)
Cardiology Consultation:   Patient ID: Schneider Gavins MRN: [id number]; DOB: Jun 02, 1950  Admit date: 09/05/2021 Date of Consult: 09/06/2021  PCP:  [name], DO   [facility] Providers Cardiologist:  [name], MD        Patient Profile:   Zakary Grays is a 71 y.o. male with a hx of coronary artery disease, essential hypertension, hyperlipidemia, diabetes, pulmonary embolism, gastroesophageal reflux disease, and stage III chronic kidney disease who is being seen 09/06/2021 for the evaluation of chest pain  at the request of Dr Billie Ruddy.  History of Present Illness:   Mr. Dittmar was previously evaluated in November 2020 with fatigue and dyspnea and underwent stress testing which was low risk without evidence of ischemia or infarct.  He subsequently was admitted to [facility] October 2021 for chest pain and NSTEMI.  Diagnostic catheterization revealed severe proximal RCA disease and this was treated with a drug-eluting stent.  Echocardiogram during that admission showed EF of 60 to 65% with trivial MR.  He was followed up in the cardiology clinic in [place] of 2022 at which time he continued to report occasional discomfort in his chest that was unrelated to exertion.  Symptoms were lasting for few seconds and resolving spontaneously.  Continue medical therapy was advised and Imdur was given.  He also complained of easy bruisability and Brilinta was discontinued in favor of Plavix.  He notes that following his visit he was doing well from a chest pain standpoint unfortunately he developed chest pain in February described as a knife in the chest that persisted throughout the day, becoming associated with weakness.  He presented to the emergency department on February 5 where ECG was unremarkable despite his prolonged symptoms, troponins were normal, D-dimer was elevated and CTA of the chest was performed that was negative for PE or other acute findings.  He says that the  morphine and GI cocktail resolved the symptoms that he was having and he was discharged home and advised to follow-up with cardiology. was recently seen in clinic by Dr. Garen Lah on 06/03/2021 for follow-up of his coronary artery disease and recent hospitalization in the emergency department on 05/02/2021 for chest pain.  He was started on Imdur and was scheduled for [facility] which was obtained due to his symptoms of chest discomfort.  His Myoview on 2/14 2023 showed a fixed apical defect, no evidence of ischemia, and a low risk study.    Unfortunately on 09/05/2020 who presented to the emergency department complaining of lower abdominal pain, nausea, generalized weakness, rigors, urinary frequency and was found to be febrile with a fever of 103 in the emergency department as well as tachycardic and tachypneic.  He also reported chest pain with associated shortness of breath that started last night around 8 PM it was located midsternal and he said it was just a pressure type pain.  And it could have been an ache but he was unsure due to the shaking that he had.  He denied any radiation.  He also denied any aggravating or alleviating factors.  When asked if his chest discomfort felt like chest discomfort from his previous heart attack he stated that the symptoms were not the same.  Initial vital signs: Blood pressure 166/153, heart rate 116, respiratory rate of 30, temperature 98.1  Pertinent labs: Sodium 142, potassium 5.0, chloride 106, CO2 23, glucose 167, BUN 19, creatinine of 1.81, calcium 9.3, WBCs of 5.2, hemoglobin of 13.2, hematocrit of 42.5, platelets of 258,  lactic acid 5.7, high-sensitivity troponins 288, 844, 1203, and 1479, urinalysis WBC greater than 50  Imaging: Chest x-ray revealed the heart mediastinal contours were within normal limits no focal opacities or effusions and no acute bony abnormalities  Past Medical History:  Diagnosis Date   Allergy    CAD (coronary artery disease)     a. 01/2019 MV: EF 55-65%, no ischemia; b. 12/2019 NSTEMI/PCI: LM nl, LAD 40p, 69m, D1 40, LCX nl, OM3 40, RCA 90/85p (2.75x34 Resolute Onyx DES), 28m/d.   Chronic headaches    CKD (chronic kidney disease), stage III (HCC)    Edentulous    no lower teeth   GERD (gastroesophageal reflux disease)    Gout    Heart burn    History of echocardiogram    a. 12/2019 Echo: EF 60-65%, no rwma, triv MR.   Hyperlipidemia    Hypertension    Motion sickness    boats   NSTEMI (non-ST elevated myocardial infarction) ([place]) 12/27/2019   Pulmonary embolism ([place]) 05/2017   Type 2 diabetes mellitus [facility])     Past Surgical History:  Procedure Laterality Date   BACK SURGERY  02/26/2019   CATARACT EXTRACTION W/PHACO Left 04/14/2021   Procedure: CATARACT EXTRACTION PHACO AND INTRAOCULAR LENS PLACEMENT ([place]) LEFT DIABETIC maylugin;  Surgeon: Leandrew Koyanagi, MD;  Location: [place];  Service: Ophthalmology;  Laterality: Left;  11.22 1:25.1   CATARACT EXTRACTION W/PHACO Right 06/09/2021   Procedure: CATARACT EXTRACTION PHACO AND INTRAOCULAR LENS PLACEMENT ([place]) RIGHT DIABETIC MALYUGIN;  Surgeon: Leandrew Koyanagi, MD;  Location: [place];  Service: Ophthalmology;  Laterality: Right;  Diabetic 4.75 01:05.7   KNEE SURGERY     LEFT HEART CATH AND CORONARY ANGIOGRAPHY N/A 12/30/2019   Procedure: LEFT HEART CATH AND CORONARY ANGIOGRAPHY;  Surgeon: Wellington Hampshire, MD;  Location: [place] [facility];  Service: Cardiovascular;  Laterality: N/A;   SHOULDER SURGERY     TONSILLECTOMY       Home Medications:  Prior to Admission medications   Medication Sig Start Date End Date Taking? Authorizing Provider  allopurinol (ZYLOPRIM) 100 MG tablet TAKE 1 TABLET AT BEDTIME 12/27/20  Yes Karamalegos, [name], DO  aspirin EC 81 MG EC tablet Take 1 tablet (81 mg total) by mouth daily. Swallow whole. 12/31/19  Yes Wieting, Richard, MD  carvedilol (COREG) 12.5 MG tablet TAKE 1 TABLET TWICE  DAILY 12/27/20  Yes Karamalegos, [name], DO  clopidogrel (PLAVIX) 75 MG tablet Take 1 tablet (75 mg total) by mouth daily. 03/23/21  Yes Agbor-Etang, Aaron Edelman, MD  ezetimibe (ZETIA) 10 MG tablet TAKE 1 TABLET EVERY DAY 03/30/21  Yes Karamalegos, Alexander J, DO  fluticasone (FLONASE) 50 MCG/ACT nasal spray USE 2 SPRAYS IN EACH NOSTRIL ONCE DAILY USE FOR 4 TO 6 WEEKS, THEN STOP AND USE SEASONALLY OR AS NEEDED. 07/06/20  Yes Karamalegos, [name], DO  isosorbide mononitrate (IMDUR) 30 MG 24 hr tablet Take 0.5 tablets (15 mg total) by mouth daily. 03/23/21 08/12/22 Yes Agbor-Etang, Aaron Edelman, MD  lisinopril (ZESTRIL) 40 MG tablet TAKE 1 TABLET EVERY DAY 12/27/20  Yes Karamalegos, [name], DO  nitroGLYCERIN (NITROSTAT) 0.4 MG SL tablet Place 1 tablet (0.4 mg total) under the tongue every 5 (five) minutes as needed for chest pain. 12/31/19  Yes [name], MD  pantoprazole (PROTONIX) 20 MG tablet Take 1 tablet (20 mg total) by mouth 2 (two) times daily before a meal. 05/24/21  Yes Karamalegos, Alexander J, DO  RYBELSUS 14 MG TABS Take 1  tablet (14 mg total) by mouth daily before breakfast. 06/09/21  Yes Karamalegos, [name], DO  ACCU-CHEK SMARTVIEW test strip TEST BLOOD SUGAR TWICE DAILY 01/13/21   [name], [name], DO  acetaminophen (TYLENOL) 500 MG tablet Take 1,000 mg by mouth every 8 (eight) hours as needed.     [provider]  DROPLET PEN NEEDLES 32G X 4 MM MISC USE WITH [name] INSULIN INJECTION DAILY AS INSTRUCTED 06/09/21   [name], [name], DO  Insulin Glargine ([name] KWIKPEN [place]) Inject 36 Units into the skin at bedtime.    [provider]  MITIGARE 0.6 MG CAPS Take for gout flare start Day 1 with 2 tablets then may take 1 extra tablet in 1 hour. Starting Day 2 take 1 tablet daily for up to 7-10 days until resolve. May increase to 1 tablet twice a day max dose if needed. Patient not taking: Reported on 06/14/2021 03/25/21   [name], DO     Inpatient Medications: Scheduled Meds:  carvedilol  12.5 mg Oral BID WC   clopidogrel  75 mg Oral Daily   docusate sodium  100 mg Oral BID   ezetimibe  10 mg Oral Daily   fluticasone  1 spray Each Nare Daily   insulin aspart  0-15 Units Subcutaneous TID WC   insulin glargine-yfgn  18 Units Subcutaneous QHS   isosorbide mononitrate  15 mg Oral Daily   Continuous Infusions:  [START ON 09/07/2021] cefTRIAXone (ROCEPHIN)  IV     heparin 1,300 Units/hr (09/06/21 0359)   PRN Meds: acetaminophen, bisacodyl, hydrALAZINE, ondansetron (ZOFRAN) IV, polyethylene glycol  Allergies:    Allergies  Allergen Reactions   Crestor [Rosuvastatin Calcium] Other (See Comments)    myalgia   Ciprofloxacin    Invokana [Canagliflozin] Other (See Comments)    dehydrated   Prednisone Other (See Comments)   Sulfamethizole     Social History:   Social History   Socioeconomic History   Marital status: Married    Spouse name: Not on file   Number of children: Not on file   Years of education: Not on file   Highest education level: Not on file  Occupational History   Occupation: retired  Tobacco Use   Smoking status: Former    Types: Cigars    Quit date: 2000    Years since quitting: 23.4   Smokeless tobacco: Current    Types: Chew   Tobacco comments:    wife states patient never smoked cigarettes, just chewing tobacco  Vaping Use   Vaping Use: Never used  Substance and Sexual Activity   Alcohol use: Not Currently    Alcohol/week: 0.0 standard drinks of alcohol   Drug use: No   Sexual activity: Yes    Birth control/protection: Inserts  Other Topics Concern   Not on file  Social History Narrative   Not on file   Social Determinants of Health   Financial Resource Strain: Low Risk  (01/26/2021)   Overall Financial Resource Strain (CARDIA)    Difficulty of Paying Living Expenses: Not hard at all  Food Insecurity: No Food Insecurity (01/26/2021)   Hunger Vital Sign    Worried About  Running Out of Food in the Last Year: Never true    [place] in the Last Year: Never true  Transportation Needs: No Transportation Needs (01/26/2021)   PRAPARE - Hydrologist (Medical): No    Lack of Transportation (Non-Medical): No  Physical Activity: Inactive (  01/26/2021)   Exercise Vital Sign    Days of Exercise per Week: 0 days    Minutes of Exercise per Session: 0 min  Stress: No Stress Concern Present (01/26/2021)   [place]    Feeling of Stress : Not at all  Social Connections: Somewhat Isolated (08/29/2017)   Social Connection and Isolation Panel [NHANES]    Frequency of Communication with Friends and Family: More than three times a week    Frequency of Social Gatherings with Friends and Family: More than three times a week    Attends Religious Services: Never    Marine scientist or Organizations: No    Attends Archivist Meetings: Never    Marital Status: Married  Human resources officer Violence: Not At Risk (08/29/2017)   Humiliation, Afraid, Rape, and Kick questionnaire    Fear of Current or Ex-Partner: No    Emotionally Abused: No    Physically Abused: No    Sexually Abused: No    Family History:    Family History  Problem Relation Age of Onset   Cancer Father        lung   Heart disease Sister    Heart disease Brother    Prostate cancer Neg Hx      ROS:  Please see the history of present illness.  Review of Systems  Constitutional:  Positive for chills and fever.  HENT: Negative.    Eyes: Negative.   Respiratory:  Positive for shortness of breath.   Cardiovascular:  Positive for chest pain.  Gastrointestinal:  Positive for nausea and vomiting.  Genitourinary:  Positive for frequency and urgency.  Musculoskeletal: Negative.   Skin:  Positive for itching.  Neurological:  Positive for weakness.  Endo/Heme/Allergies: Negative.    Psychiatric/Behavioral: Negative.      All other ROS reviewed and negative.     Physical Exam/Data:   Vitals:   09/06/21 0255 09/06/21 0736 09/06/21 1143 09/06/21 1617  BP: 140/71 119/71 122/60 125/65  Pulse: 82 66 64 66  Resp: 18 20 14 20   Temp: 99 F (37.2 C) 97.9 F (36.6 C) 98.4 F (36.9 C) 98.4 F (36.9 C)  TempSrc: Oral Oral Oral   SpO2: 97% 98% 99% 97%  Weight:      Height:        Intake/Output Summary (Last 24 hours) at 09/06/2021 1708 Last data filed at 09/06/2021 1214 Gross per 24 hour  Intake 1921.51 ml  Output 1400 ml  Net 521.51 ml      09/05/2021    8:39 PM 08/12/2021    8:43 AM 06/09/2021    6:45 AM  Last 3 Weights  Weight (lbs) 230 lb 230 lb 12.8 oz 230 lb 6.4 oz  Weight (kg) 104.327 kg 104.69 kg 104.509 kg     Body mass index is 32.08 kg/m.  General:  Well nourished, well developed, in no acute distress, sitting in bed watching TV HEENT: normal Neck: no JVD appreciated  Vascular: No carotid bruits; Distal pulses 2+ bilaterally Cardiac:  normal S1, S2; RRR; no murmur  Lungs:  clear to auscultation bilaterally, no wheezing, rhonchi or rales, respirations are unlabored on room air Abd: soft, nontender, no hepatomegaly, obese, bowel present in all 4 quadrants Ext: no edema Musculoskeletal:  No deformities, BUE and BLE strength normal and equal Skin: warm and dry  Neuro:  CNs 2-12 intact, no focal abnormalities noted Psych:  Normal affect   EKG:  The EKG was personally reviewed and demonstrates: Normal sinus rhythm rate of 78 with a left axis deviation and borderline LVH Telemetry:  Telemetry was personally reviewed and demonstrates: Sinus rhythm 60-70's with unifocal PVCs  Relevant CV Studies: Lexiscan Myoview completed 05/11/2021   Low risk, probably normal pharmacologic myocardial perfusion stress test.   There is a moderate in size, mild-moderate in severity, fixed apical inferior, apical lateral, and apical defect that most likely represents  artifact but cannot rule out an element of scar.   There is no significant ischemia.   Left ventricular systolic function is low normal (LVEF 53%).   Coronary artery calcification and RCA stent are noted, as well as aortic atherosclerosis.  Query trivial pericardial effusion versus mild pericardial thickening.   There have been no significant changes compared to the prior study from 02/18/2019.  Left Heart Cath completed 12/30/2019 Prox RCA-1 lesion is 90% stenosed. Prox RCA-2 lesion is 85% stenosed. Mid RCA lesion is 30% stenosed. Dist RCA lesion is 30% stenosed. 3rd Mrg lesion is 40% stenosed. Prox LAD lesion is 40% stenosed. Mid LAD lesion is 30% stenosed. 1st Diag lesion is 40% stenosed. A drug-eluting stent was successfully placed using a STENT RESOLUTE ONYX [id number]. Post intervention, there is a 0% residual stenosis. Post intervention, there is a 0% residual stenosis.   1.  Severe one-vessel coronary artery disease involving proximal right coronary artery.  There is also moderate LAD and left circumflex disease. 2.  Left ventricular angiography was not performed due to chronic kidney disease.  EF was normal by echo.  Mildly elevated left ventricular end-diastolic pressure around 20 mmHg. 3.  Successful angioplasty and drug-eluting stent placement to the proximal right coronary artery.  Laboratory Data:  High Sensitivity Troponin:   Recent Labs  Lab 09-25-2021 2045 09-25-21 2233 09/06/21 0030 09/06/21 0648 09/06/21 0946  TROPONINIHS 13 284* 844* 1,203* 1,479*     Chemistry Recent Labs  Lab September 25, 2021 2045  NA 142  K 5.0  CL 106  CO2 23  GLUCOSE 167*  BUN 19  CREATININE 1.81*  CALCIUM 9.3  GFRNONAA 40*  ANIONGAP 13    Recent Labs  Lab 09-25-21 2045  PROT 8.3*  ALBUMIN 4.1  AST 31  ALT 20  ALKPHOS 70  BILITOT 1.3*   Lipids No results for input(s): "CHOL", "TRIG", "HDL", "LABVLDL", "LDLCALC", "CHOLHDL" in the last 168 hours.  Hematology Recent Labs  Lab  09/25/2021 2045  WBC 5.2  RBC 4.63  HGB 13.2  HCT 42.5  MCV 91.8  MCH 28.5  MCHC 31.1  RDW 15.2  PLT 258   Thyroid No results for input(s): "TSH", "FREET4" in the last 168 hours.  BNP Recent Labs  Lab 09/25/2021 2045  BNP 71.6    DDimer  Recent Labs  Lab 09/25/21 2045  DDIMER 2.61*     Radiology/Studies:  US RENAL  Result Date: 2021-09-25 CLINICAL DATA:  Acute kidney injury. EXAM: RENAL / URINARY TRACT ULTRASOUND COMPLETE COMPARISON:  July 22, 2015 FINDINGS: Right Kidney: Renal measurements: 13.0 cm x 8.4 cm x 6.9 cm = volume: 394.8 mL. Diffusely increased echogenicity of the renal parenchyma is noted. A 2.4 cm x 2.6 cm x 2.2 cm cystic appearing area is seen within the lateral aspect of the mid right kidney. No hydronephrosis is visualized. Left Kidney: Renal measurements: 9.9 cm x 6.3 cm x 5.8 cm = volume: 188.6 mL. Diffusely increased echogenicity of the renal parenchyma is noted. 4.0 cm x 2.8 cm x 2.3 cm  and 3.0 cm x 1.7 cm x 2.8 cm cysts are seen within the left kidney. No hydronephrosis is visualized. Bladder: Appears normal for degree of bladder distention. Other: None. IMPRESSION: 1. Bilateral simple renal cysts. 2. Echogenic kidneys likely secondary to medical renal disease. Electronically Signed   By: Virgina Norfolk M.D.   On: 09/05/2021 23:00   DG Chest Port 1 View  Result Date: 09/05/2021 CLINICAL DATA:  Chest pain, shortness of breath, chills EXAM: PORTABLE CHEST 1 VIEW COMPARISON:  05/02/2021 FINDINGS: Heart and mediastinal contours are within normal limits. No focal opacities or effusions. No acute bony abnormality. IMPRESSION: No active disease. Electronically Signed   By: Rolm Baptise M.D.   On: 09/05/2021 21:24     Assessment and Plan:   Sepsis secondary to UTI -Patient presented with fevers, chills, abdominal pain and had to have a urinary tract infection with WBCs on urinalysis greater than 50 -Continue IV fluids, antiemetics, and antibiotics per primary  team -Urine culture ordered and pending -Patient states that he was treated at outside source for urinary tract infection several weeks ago -Supportive care  2.  Chest pain with elevated high-sensitivity troponins/NSTEMI -Patient currently pain-free -No ischemic changes noted to EKG -Echocardiogram ordered to look for wall motion abnormalities -Trended troponins revealed 288, 844, 1203, and 1479, will need to continue with lab draws until decline -Lexiscan Myoview completed 05/11/2021 negative results -We will need further ischemic work-up once fever and infection has resolved -Positive D-dimer will need CTA of the chest to rule out PE -Continue heparin drip  3.  Coronary artery disease with stable angina -Patient with PCI in October 2021 with a drug-eluting stent placement to the RCA -Continue Plavix  -Continue Imdur -Continue carvedilol  4. Essential hypertension -lisinopril is on hold due to kidney function -continue PRN hydralazine -vitals per unit protocol  5. Hyperlipidemia -LDL 36 -continue zetia -intolerant of statins   Risk Assessment/Risk Scores:     TIMI Risk Score for Unstable Angina or Non-ST Elevation MI:   The patient's TIMI risk score is 5, which indicates a 26% risk of all cause mortality, new or recurrent myocardial infarction or need for urgent revascularization in the next 14 days.          For questions or updates, please contact [place] Please consult [email or web address] for contact info under    Signed, Eeva Schlosser, NP  09/06/2021 5:08 PM

## 2021-09-06 NOTE — TOC Initial Note (Signed)
Transition of Care [facility]) - Initial/Assessment Note    Patient Details  Name: [name] MRN: [id number] Date of Birth: Jan 24, 1951  Transition of Care [facility]) CM/SW Contact:    Laurena Slimmer, RN Phone Number: 09/06/2021, 9:05 AM  Clinical Narrative:                  Transition of Care [facility]) Screening Note   Patient Details  Name: [name] Date of Birth: 04/12/1950   Transition of Care [facility]) CM/SW Contact:    Laurena Slimmer, RN Phone Number: 09/06/2021, 9:05 AM    Transition of Care Department [facility]) has reviewed patient and no TOC needs have been identified at this time. We will continue to monitor patient advancement through interdisciplinary progression rounds. If new patient transition needs arise, please place a TOC consult.          Patient Goals and CMS Choice        Expected Discharge Plan and Services                                                Prior Living Arrangements/Services                       Activities of Daily Living Home Assistive Devices/Equipment: CBG Meter, Blood pressure cuff ADL Screening (condition at time of admission) Patient's cognitive ability adequate to safely complete daily activities?: Yes Is the patient deaf or have difficulty hearing?: No Does the patient have difficulty seeing, even when wearing glasses/contacts?: No Does the patient have difficulty concentrating, remembering, or making decisions?: No Patient able to express need for assistance with ADLs?: Yes Does the patient have difficulty dressing or bathing?: No Independently performs ADLs?: Yes (appropriate for developmental age) Does the patient have difficulty walking or climbing stairs?: No Weakness of Legs: None Weakness of Arms/Hands: None  Permission Sought/Granted                  Emotional Assessment              Admission diagnosis:  Severe sepsis ([place]) [A41.9, R65.20] Urinary tract infection without  hematuria, site unspecified [N39.0] Sepsis, due to unspecified organism, unspecified whether acute organ dysfunction present [facility]) [A41.9] Patient Active Problem List   Diagnosis Date Noted   Diabetes mellitus ([place]) 09/05/2021   Severe sepsis ([place]) 09/05/2021   Sepsis secondary to UTI ([place]) 09/05/2021   Chest pain 09/05/2021   Hx of gout    Elevated PSA, less than 10 ng/ml 10/03/2017   Lumbar radiculopathy 10/03/2017   Drug-induced myopathy 10/03/2017   Obesity (BMI 30.0-34.9) 10/03/2017   History of pulmonary embolus (PE) 12/05/2016   Chronic pain of left knee 10/07/2016   CKD stage 3 due to type 2 diabetes mellitus ([place]) 10/07/2016   Muscle strain 11/17/2015   Low back pain 10/08/2015   Hx of hematuria 07/16/2015   Ganglion cyst of left foot 01/23/2015   Smokeless tobacco use 01/12/2015   Type 2 diabetes mellitus with other specified complication ([place]) 123456   Benign hypertension with CKD (chronic kidney disease) stage III ([place]) 10/16/2014   Hyperlipidemia associated with type 2 diabetes mellitus ([place]) 10/16/2014   Gout 10/16/2014   GERD without esophagitis 10/16/2014   Essential hypertension 10/16/2014   Injury of tendon of upper extremity 05/29/2014   Glenoid  labral tear 05/29/2014   Complete rotator cuff rupture of left shoulder 05/09/2014   Infraspinatus tenosynovitis 05/09/2014   Other synovitis and tenosynovitis, right shoulder 05/09/2014   PCP:  [name], DO Pharmacy:   [facility] [address] [place] - [place] [address] [place] [address] Phone: [phone] Fax: [phone]  [facility] [place], [place] [address] Phone: [phone] Fax: [phone]     Social Determinants of Health (SDOH) Interventions    Readmission Risk Interventions     No data to display

## 2021-09-06 NOTE — Assessment & Plan Note (Addendum)
-  Patient with PCI in October 2021 with a drug-eluting stent placement to the RCA Plan: -Continue Plavix  -Continue Imdur -Continue carvedilol --cont Zetia (statin intolerant)

## 2021-09-06 NOTE — Assessment & Plan Note (Addendum)
--  likely from urinary source --started on vanc/cefe/flagyl on presentation, de-escalated to ceftriaxone Plan: --cont ceftriaxone pending blood cx sensitivities

## 2021-09-06 NOTE — Progress Notes (Signed)
  Progress Note   Patient: [name] HEN:[id number] DOB: January 13, 1951 DOA: 09/05/2021     1 DOS: the patient was seen and examined on 09/06/2021   Brief hospital course: No notes on file  Assessment and Plan: * E coli bacteremia --likely from urinary source --switch abx to ceftriaxone today  Severe sepsis (HCC) --fever, tachycardia, lactic acidosis, source E coli bacteremia   Type 2 diabetes mellitus with other specified complication (HCC) --resume home long-acting as glargine 18u nightly --SSI TID   HTN (hypertension) --cont coreg and Imdur  CAD S/P percutaneous coronary angioplasty -Patient with PCI in October 2021 with a drug-eluting stent placement to the RCA -Continue Plavix  -Continue Imdur -Continue carvedilol  NSTEMI (non-ST elevated myocardial infarction) (HCC) --trop normal on presentation, trended up to 1400's the next day. --started on heparin gtt Plan: --cardiology consult today --cont heparin gtt --cont plavix        Subjective:  No cough, no dyspnea.  Currently no chest pain.  Trop trended up to >1000's.  Cardiology consulted.   Physical Exam:  Constitutional: NAD, AAOx3 HEENT: conjunctivae and lids normal, EOMI CV: No cyanosis.   RESP: normal respiratory effort, on RA SKIN: warm, dry Neuro: II - XII grossly intact.   Psych: Normal mood and affect.  Appropriate judgement and reason   Data Reviewed:  Family Communication: wife and son updated at bedside today  Disposition: Status is: Inpatient   Planned Discharge Destination: Home    Time spent: 50 minutes  Author: Darlin Priestly, MD 09/06/2021 7:57 PM  For on call review [email or web address].

## 2021-09-07 ENCOUNTER — Inpatient Hospital Stay (HOSPITAL_COMMUNITY)
Admit: 2021-09-07 | Discharge: 2021-09-07 | Disposition: A | Discharge status: Home / Self Care | Payer: Medicare HMO | Attending: Cardiology | Admitting: Cardiology

## 2021-09-07 DIAGNOSIS — R7881 Bacteremia: Secondary | ICD-10-CM | POA: Diagnosis not present

## 2021-09-07 DIAGNOSIS — I214 Non-ST elevation (NSTEMI) myocardial infarction: Secondary | ICD-10-CM

## 2021-09-07 DIAGNOSIS — B9620 Unspecified Escherichia coli [E. coli] as the cause of diseases classified elsewhere: Secondary | ICD-10-CM | POA: Diagnosis not present

## 2021-09-07 DIAGNOSIS — N1831 Chronic kidney disease, stage 3a: Secondary | ICD-10-CM

## 2021-09-07 LAB — BASIC METABOLIC PANEL
Anion gap: 6 (ref 5–15)
BUN: 20 mg/dL (ref 8–23)
CO2: 23 mmol/L (ref 22–32)
Calcium: 8.4 mg/dL — ABNORMAL LOW (ref 8.9–10.3)
Chloride: 109 mmol/L (ref 98–111)
Creatinine, Ser: 1.48 mg/dL — ABNORMAL HIGH (ref 0.61–1.24)
GFR, Estimated: 51 mL/min — ABNORMAL LOW (ref >60)
Glucose, Bld: 149 mg/dL — ABNORMAL HIGH (ref 70–99)
Potassium: 3.5 mmol/L (ref 3.5–5.1)
Sodium: 138 mmol/L (ref 135–145)

## 2021-09-07 LAB — CBC
HCT: 32 % — ABNORMAL LOW (ref 39.0–52.0)
Hemoglobin: 10.4 g/dL — ABNORMAL LOW (ref 13.0–17.0)
MCH: 28.7 pg (ref 26.0–34.0)
MCHC: 32.5 g/dL (ref 30.0–36.0)
MCV: 88.2 fL (ref 80.0–100.0)
Platelets: 170 10*3/uL (ref 150–400)
RBC: 3.63 MIL/uL — ABNORMAL LOW (ref 4.22–5.81)
RDW: 15.2 % (ref 11.5–15.5)
WBC: 4.3 10*3/uL (ref 4.0–10.5)
nRBC: 0 % (ref 0.0–0.2)

## 2021-09-07 LAB — ECHOCARDIOGRAM COMPLETE
AR max vel: 2.45 cm2
AV Area VTI: 3.16 cm2
AV Area mean vel: 2.78 cm2
AV Mean grad: 3 mmHg
AV Peak grad: 6.1 mmHg
Ao pk vel: 1.23 m/s
Area-P 1/2: 3.53 cm2
Height: 71 in
MV VTI: 1.94 cm2
S' Lateral: 2.9 cm
Weight: 3680 oz

## 2021-09-07 LAB — MAGNESIUM: Magnesium: 2.2 mg/dL (ref 1.7–2.4)

## 2021-09-07 LAB — GLUCOSE, CAPILLARY
Glucose-Capillary: 138 mg/dL — ABNORMAL HIGH (ref 70–99)
Glucose-Capillary: 225 mg/dL — ABNORMAL HIGH (ref 70–99)
Glucose-Capillary: 237 mg/dL — ABNORMAL HIGH (ref 70–99)
Glucose-Capillary: 119 mg/dL — ABNORMAL HIGH (ref 70–99)

## 2021-09-07 LAB — HEPARIN LEVEL (UNFRACTIONATED)
Heparin Unfractionated: 0.33 IU/mL (ref 0.30–0.70)
Heparin Unfractionated: 0.36 IU/mL (ref 0.30–0.70)

## 2021-09-07 MED ORDER — INSULIN GLARGINE-YFGN 100 UNIT/ML ~~LOC~~ SOLN
22.0000 [IU] | Freq: Every day | SUBCUTANEOUS | Status: DC
Start: 2021-09-07 — End: 2021-09-09
  Administered 2021-09-07 – 2021-09-08 (×2): 22 [IU] via SUBCUTANEOUS
  Filled 2021-09-07 (×2): qty 0.22

## 2021-09-07 MED ORDER — PERFLUTREN LIPID MICROSPHERE
1.0000 mL | INTRAVENOUS | Status: AC | PRN
  Administered 2021-09-07: 2 mL via INTRAVENOUS

## 2021-09-07 MED ORDER — INSULIN ASPART 100 UNIT/ML IJ SOLN
5.0000 [IU] | Freq: Three times a day (TID) | INTRAMUSCULAR | Status: DC
  Administered 2021-09-08 – 2021-09-09 (×4): 5 [IU] via SUBCUTANEOUS
  Filled 2021-09-07 (×4): qty 1

## 2021-09-07 NOTE — Progress Notes (Signed)
*  PRELIMINARY RESULTS* Echocardiogram 2D Echocardiogram has been performed.  Cristela Blue 09/07/2021, 9:14 AM

## 2021-09-07 NOTE — Progress Notes (Signed)
ANTICOAGULATION CONSULT NOTE   Pharmacy Consult for Heparin  Indication: chest pain/ACS  Allergies  Allergen Reactions   Crestor [Rosuvastatin Calcium] Other (See Comments)    myalgia   Ciprofloxacin    Invokana [Canagliflozin] Other (See Comments)    dehydrated   Prednisone Other (See Comments)   Sulfamethizole     Patient Measurements: Height: 5\' 11"  (180.3 cm) Weight: 104.3 kg (230 lb) IBW/kg (Calculated) : 75.3 Heparin Dosing Weight:  97.2 kg   Vital Signs: Temp: 98.2 F (36.8 C) (06/13 1123) BP: 147/74 (06/13 1123) Pulse Rate: 71 (06/13 1123)  Labs: Recent Labs    09/05/21 2045 09/05/21 2233 09/06/21 0030 09/06/21 [id number] 09/06/21 0648 09/06/21 0946 09/06/21 1243 09/07/21 0410 09/07/21 1428  HGB 13.2  --   --   --   --   --   --  10.4*  --   HCT 42.5  --   --   --   --   --   --  32.0*  --   PLT 258  --   --   --   --   --   --  170  --   APTT 27  --   --   --   --   --   --   --   --   LABPROT 13.8  --   --   --   --   --   --   --   --   INR 1.1  --   --   --   --   --   --   --   --   HEPARINUNFRC  --   --   --  0.49   < >  --  0.51 0.33 0.36  CREATININE 1.81*  --   --   --   --   --   --  1.48*  --   TROPONINIHS 13   < > 844* 1,203*  --  1,479*  --   --   --    < > = values in this interval not displayed.     Estimated Creatinine Clearance: 57.1 mL/min (A) (by C-G formula based on SCr of 1.48 mg/dL (H)).   Medical History: Past Medical History:  Diagnosis Date   Allergy    CAD (coronary artery disease)    a. 01/2019 MV: EF 55-65%, no ischemia; b. 12/2019 NSTEMI/PCI: LM nl, LAD 40p, 30m, D1 40, LCX nl, OM3 40, RCA 90/85p (2.75x34 Resolute Onyx DES), 93m/d.   Chronic headaches    CKD (chronic kidney disease), stage III (HCC)    Edentulous    no lower teeth   GERD (gastroesophageal reflux disease)    Gout    Heart burn    History of echocardiogram    a. 12/2019 Echo: EF 60-65%, no rwma, triv MR.   Hyperlipidemia    Hypertension    Motion  sickness    boats   NSTEMI (non-ST elevated myocardial infarction) ([place]) 12/27/2019   Pulmonary embolism ([place]) 05/2017   Type 2 diabetes mellitus (HCC)     Medications:  Medications Prior to Admission  Medication Sig Dispense Refill Last Dose   allopurinol (ZYLOPRIM) 100 MG tablet TAKE 1 TABLET AT BEDTIME 90 tablet 3 Past Week   aspirin EC 81 MG EC tablet Take 1 tablet (81 mg total) by mouth daily. Swallow whole. 30 tablet 0 09/05/2021   carvedilol (COREG) 12.5 MG tablet TAKE 1 TABLET TWICE DAILY 180 tablet 3  09/05/2021   clopidogrel (PLAVIX) 75 MG tablet Take 1 tablet (75 mg total) by mouth daily. 90 tablet 3 09/05/2021   ezetimibe (ZETIA) 10 MG tablet TAKE 1 TABLET EVERY DAY 90 tablet 3 09/05/2021   fluticasone (FLONASE) 50 MCG/ACT nasal spray USE 2 SPRAYS IN EACH NOSTRIL ONCE DAILY USE FOR 4 TO 6 WEEKS, THEN STOP AND USE SEASONALLY OR AS NEEDED. 48 g 0 prn   isosorbide mononitrate (IMDUR) 30 MG 24 hr tablet Take 0.5 tablets (15 mg total) by mouth daily. 45 tablet 3 09/05/2021   lisinopril (ZESTRIL) 40 MG tablet TAKE 1 TABLET EVERY DAY 90 tablet 3 09/05/2021   nitroGLYCERIN (NITROSTAT) 0.4 MG SL tablet Place 1 tablet (0.4 mg total) under the tongue every 5 (five) minutes as needed for chest pain. 30 tablet 0 prn   pantoprazole (PROTONIX) 20 MG tablet Take 1 tablet (20 mg total) by mouth 2 (two) times daily before a meal. 90 tablet 3 09/05/2021   RYBELSUS 14 MG TABS Take 1 tablet (14 mg total) by mouth daily before breakfast. 30 tablet 0 09/05/2021   ACCU-CHEK SMARTVIEW test strip TEST BLOOD SUGAR TWICE DAILY 200 strip 0    acetaminophen (TYLENOL) 500 MG tablet Take 1,000 mg by mouth every 8 (eight) hours as needed.    prn   DROPLET PEN NEEDLES 32G X 4 MM MISC USE WITH [name] INSULIN INJECTION DAILY AS INSTRUCTED 100 each 1    Insulin Glargine ([name] KWIKPEN [place]) Inject 36 Units into the skin at bedtime.   09/04/2021   MITIGARE 0.6 MG CAPS Take for gout flare start Day 1 with 2 tablets then may  take 1 extra tablet in 1 hour. Starting Day 2 take 1 tablet daily for up to 7-10 days until resolve. May increase to 1 tablet twice a day max dose if needed. (Patient not taking: Reported on 06/14/2021) 30 capsule 2 Not Taking    Assessment: Pharmacy consulted to dose heparin in this 71 year old male admitted with ACS/NSTEMI.  No prior anticoag noted.  CrCl = 46.7 ml/min   6/12@0648 : HL 0.49, therapeutic x 1 6/12@1243 : HL 0.51, therapeutic x 2 6/13@0410 : HL 0.33, therapeutic x 3, trending down 6/13@1428 : HL 0.36, therapeutic   Goal of Therapy:  Heparin level 0.3-0.7 units/ml Monitor platelets by anticoagulation protocol: Yes   Plan:  6/13@1428 : HL 0.36, therapeutic  Continue heparin infusion at 1400 units/hr Recheck heparin with daily labs Continue to monitor H&H and platelets  Dorothe Pea, PharmD, BCPS Clinical Pharmacist   09/07/2021 3:18 PM

## 2021-09-07 NOTE — Progress Notes (Signed)
Progress Note  Patient Name: [name] Date of Encounter: 09/07/2021  [facility] Cardiologist: [name], MD   Subjective   Patient seen on AM rounds. Denies any chest pain or shortness of breath. Echocardiogram completed this morning. Wife remains at the bedside  Inpatient Medications    Scheduled Meds:  carvedilol  12.5 mg Oral BID WC   clopidogrel  75 mg Oral Daily   docusate sodium  100 mg Oral BID   ezetimibe  10 mg Oral Daily   fluticasone  1 spray Each Nare Daily   insulin aspart  0-15 Units Subcutaneous TID WC   insulin glargine-yfgn  18 Units Subcutaneous QHS   isosorbide mononitrate  15 mg Oral Daily   Continuous Infusions:  cefTRIAXone (ROCEPHIN)  IV 2 g (09/07/21 0832)   heparin 1,400 Units/hr (09/07/21 0625)   PRN Meds: acetaminophen, bisacodyl, hydrALAZINE, ondansetron (ZOFRAN) IV, polyethylene glycol   Vital Signs    Vitals:   09/06/21 2329 09/07/21 0512 09/07/21 0756 09/07/21 1000  BP: (!) 161/65 (!) 193/77 (!) 177/90 135/70  Pulse: 80 73 74   Resp: 17 18 17    Temp: 98.6 F (37 C) 99.2 F (37.3 C) 99.2 F (37.3 C)   TempSrc:      SpO2: 97% 96% 96%   Weight:      Height:        Intake/Output Summary (Last 24 hours) at 09/07/2021 1115 Last data filed at 09/07/2021 0800 Gross per 24 hour  Intake 480 ml  Output 2200 ml  Net -1720 ml      09/05/2021    8:39 PM 08/12/2021    8:43 AM 06/09/2021    6:45 AM  Last 3 Weights  Weight (lbs) 230 lb 230 lb 12.8 oz 230 lb 6.4 oz  Weight (kg) 104.327 kg 104.69 kg 104.509 kg      Telemetry    SR 60-70- Personally Reviewed  ECG    No new tracings- Personally Reviewed  Physical Exam   GEN: No acute distress. Sitting upright in bed. Neck: No JVD appreciated Cardiac: RRR, no murmurs, rubs, or gallops.  Respiratory: Clear to auscultation bilaterally.Respirations are unlabored on room air GI: Soft, nontender, non-distended, obese with bowel sounds positive in all 4  quadrants MS: No edema; No deformity. Neuro:  Nonfocal  Psych: Normal affect   Labs    High Sensitivity Troponin:   Recent Labs  Lab 09/05/21 2045 09/05/21 2233 09/06/21 0030 09/06/21 0648 09/06/21 0946  TROPONINIHS 13 284* 844* 1,203* 1,479*     Chemistry Recent Labs  Lab 09/05/21 2045 09/07/21 0410  NA 142 138  K 5.0 3.5  CL 106 109  CO2 23 23  GLUCOSE 167* 149*  BUN 19 20  CREATININE 1.81* 1.48*  CALCIUM 9.3 8.4*  MG  --  2.2  PROT 8.3*  --   ALBUMIN 4.1  --   AST 31  --   ALT 20  --   ALKPHOS 70  --   BILITOT 1.3*  --   GFRNONAA 40* 51*  ANIONGAP 13 6    Lipids No results for input(s): "CHOL", "TRIG", "HDL", "LABVLDL", "LDLCALC", "CHOLHDL" in the last 168 hours.  Hematology Recent Labs  Lab 09/05/21 2045 09/07/21 0410  WBC 5.2 4.3  RBC 4.63 3.63*  HGB 13.2 10.4*  HCT 42.5 32.0*  MCV 91.8 88.2  MCH 28.5 28.7  MCHC 31.1 32.5  RDW 15.2 15.2  PLT 258 170   Thyroid No results for input(s): "  TSH", "FREET4" in the last 168 hours.  BNP Recent Labs  Lab 09/05/21 2045  BNP 71.6    DDimer  Recent Labs  Lab 09/05/21 2045  DDIMER 2.61*     Radiology    US RENAL  Result Date: 09/05/2021 CLINICAL DATA:  Acute kidney injury. EXAM: RENAL / URINARY TRACT ULTRASOUND COMPLETE COMPARISON:  July 22, 2015 FINDINGS: Right Kidney: Renal measurements: 13.0 cm x 8.4 cm x 6.9 cm = volume: 394.8 mL. Diffusely increased echogenicity of the renal parenchyma is noted. A 2.4 cm x 2.6 cm x 2.2 cm cystic appearing area is seen within the lateral aspect of the mid right kidney. No hydronephrosis is visualized. Left Kidney: Renal measurements: 9.9 cm x 6.3 cm x 5.8 cm = volume: 188.6 mL. Diffusely increased echogenicity of the renal parenchyma is noted. 4.0 cm x 2.8 cm x 2.3 cm and 3.0 cm x 1.7 cm x 2.8 cm cysts are seen within the left kidney. No hydronephrosis is visualized. Bladder: Appears normal for degree of bladder distention. Other: None. IMPRESSION: 1. Bilateral  simple renal cysts. 2. Echogenic kidneys likely secondary to medical renal disease. Electronically Signed   By: Virgina Norfolk M.D.   On: 09/05/2021 23:00   DG Chest Port 1 View  Result Date: 09/05/2021 CLINICAL DATA:  Chest pain, shortness of breath, chills EXAM: PORTABLE CHEST 1 VIEW COMPARISON:  05/02/2021 FINDINGS: Heart and mediastinal contours are within normal limits. No focal opacities or effusions. No acute bony abnormality. IMPRESSION: No active disease. Electronically Signed   By: Rolm Baptise M.D.   On: 09/05/2021 21:24    Cardiac Studies  Lexiscan Myoview completed on 05/11/2021  Low risk, probably normal pharmacologic myocardial perfusion stress test.   There is a moderate in size, mild-moderate in severity, fixed apical inferior, apical lateral, and apical defect that most likely represents artifact but cannot rule out an element of scar.   There is no significant ischemia.   Left ventricular systolic function is low normal (LVEF 53%).   Coronary artery calcification and RCA stent are noted, as well as aortic atherosclerosis.  Query trivial pericardial effusion versus mild pericardial thickening.   There have been no significant changes compared to the prior study from 02/18/2019.  Patient Profile     71 y.o. male with a history of coronary artery disease, essential hypertension, hyperlipidemia, diabetes, pulmonary embolism, GERD, and stage III CKD who is being evaluated for chest pain and treated for E-coli bacteremia   Assessment & Plan    Sepsis secondary to UTI -patient presented with fevers, chills, abdominal pain and was found to have a UTI with ABC on UA greater than 50,000 -continue IVF, antiemetics and antibiotics per primary team -urine culture revealed E.coli -supportive care  2. NSTEMI versus demand ischemia in the setting of E coli bacteremia and sepsis -patient remains pain free -no changes noted on EKG -echocardiogram report pending -trended troponins  [phone] -Lexiscan myoview completed 05/11/2021 negative  -consider further ischemic work-up once infection as resolved -heparin drip for 48 hours -positive d-dimer  3. Coronary artery disease with stable angina  -PCI in 12/2019 with DES to the RCA -continue plavix -continue imdur -continue carviedilol  4. Essentila hypertension -lisinopril currenlty on hold do to elevated creatinine -continue PRN hydralazine -VS per unit protocol  5. Hyperlipidemia -LDL 36 -continue zetia -statin intolerant   6. CKD III -creatine 1.48 -On admission creatinine 1.81 -continue with gentle hydration -daily bmp     For questions or updates, please  contact [place] Please consult [email or web address] for contact info under        Signed, Clark Clowdus, NP  09/07/2021, 11:15 AM

## 2021-09-07 NOTE — Progress Notes (Signed)
  Progress Note   Patient: Shamir Tuzzolino HAF:[id number] DOB: Nov 11, 1950 DOA: 09/05/2021     2 DOS: the patient was seen and examined on 09/07/2021   Brief hospital course: Pesach Frisch is a 71 y.o. male with medical history significant of diabetes mellitus type 2, hypertension, pulmonary embolism, CKD coming to [place] with fevers chills.  Patient also reports chest pain and shortness of breath.  Patient also describes lower abdominal pain with dysuria frequency that is been going on at least for the past 2 weeks.    Assessment and Plan: * E coli bacteremia --likely from urinary source --started on vanc/cefe/flagyl on presentation, de-escalated to ceftriaxone Plan: --cont ceftriaxone pending blood cx sensitivities  Severe sepsis (HCC) --fever, tachycardia, lactic acidosis, source E coli bacteremia   Type 2 diabetes mellitus with other specified complication (HCC) --cont home long-acting as glargine 22u nightly --add mealtime 5u TID --SSI TID   HTN (hypertension) --cont coreg and Imdur  CAD S/P percutaneous coronary angioplasty -Patient with PCI in October 2021 with a drug-eluting stent placement to the RCA Plan: -Continue Plavix  -Continue Imdur -Continue carvedilol --cont Zetia (statin intolerant)  NSTEMI (non-ST elevated myocardial infarction) (HCC) --trop normal on presentation, trended up to 1400's the next day. --started on heparin gtt Plan: --defer catheterization until bacteremia has been adequately treated --cont heparin gtt for 48 hours total --cont plavix  Acute kidney injury superimposed on CKD (HCC) On CKD 3a --Cr 1.81 on presentation, 1.48 the next day which is about baseline.     --hold home Lisinopril for now        Subjective:  Pt reported urinary frequency but no pain.   Physical Exam:  Constitutional: NAD, AAOx3, sitting at edge of bed HEENT: conjunctivae and lids normal, EOMI CV: No cyanosis.   RESP: normal respiratory effort, on  RA Neuro: II - XII grossly intact.   Psych: Normal mood and affect.  Appropriate judgement and reason    Data Reviewed:  Family Communication: wife updated at bedside today  Disposition: Status is: Inpatient   Planned Discharge Destination: Home    Time spent: 50 minutes  Author: Darlin Priestly, MD 09/07/2021 9:43 PM  For on call review [email or web address].

## 2021-09-07 NOTE — Assessment & Plan Note (Addendum)
On CKD 3a --Cr 1.81 on presentation, 1.48 the next day which is about baseline.     --hold home Lisinopril for now

## 2021-09-07 NOTE — Progress Notes (Signed)
ANTICOAGULATION CONSULT NOTE   Pharmacy Consult for Heparin  Indication: chest pain/ACS  Allergies  Allergen Reactions   Crestor [Rosuvastatin Calcium] Other (See Comments)    myalgia   Ciprofloxacin    Invokana [Canagliflozin] Other (See Comments)    dehydrated   Prednisone Other (See Comments)   Sulfamethizole     Patient Measurements: Height: 5\' 11"  (180.3 cm) Weight: 104.3 kg (230 lb) IBW/kg (Calculated) : 75.3 Heparin Dosing Weight:  97.2 kg   Vital Signs: Temp: 99.2 F (37.3 C) (06/13 0512) BP: 193/77 (06/13 0512) Pulse Rate: 73 (06/13 0512)  Labs: Recent Labs    09/05/21 2045 09/05/21 2233 09/06/21 0030 09/06/21 0648 09/06/21 0946 09/06/21 1243 09/07/21 0410  HGB 13.2  --   --   --   --   --  10.4*  HCT 42.5  --   --   --   --   --  32.0*  PLT 258  --   --   --   --   --  170  APTT 27  --   --   --   --   --   --   LABPROT 13.8  --   --   --   --   --   --   INR 1.1  --   --   --   --   --   --   HEPARINUNFRC  --   --   --  0.49  --  0.51 0.33  CREATININE 1.81*  --   --   --   --   --  1.48*  TROPONINIHS 13   < > 844* 1,203* 1,479*  --   --    < > = values in this interval not displayed.     Estimated Creatinine Clearance: 57.1 mL/min (A) (by C-G formula based on SCr of 1.48 mg/dL (H)).   Medical History: Past Medical History:  Diagnosis Date   Allergy    CAD (coronary artery disease)    a. 01/2019 MV: EF 55-65%, no ischemia; b. 12/2019 NSTEMI/PCI: LM nl, LAD 40p, 48m, D1 40, LCX nl, OM3 40, RCA 90/85p (2.75x34 Resolute Onyx DES), 56m/d.   Chronic headaches    CKD (chronic kidney disease), stage III (HCC)    Edentulous    no lower teeth   GERD (gastroesophageal reflux disease)    Gout    Heart burn    History of echocardiogram    a. 12/2019 Echo: EF 60-65%, no rwma, triv MR.   Hyperlipidemia    Hypertension    Motion sickness    boats   NSTEMI (non-ST elevated myocardial infarction) ([place]) 12/27/2019   Pulmonary embolism ([place]) 05/2017    Type 2 diabetes mellitus (HCC)     Medications:  Medications Prior to Admission  Medication Sig Dispense Refill Last Dose   allopurinol (ZYLOPRIM) 100 MG tablet TAKE 1 TABLET AT BEDTIME 90 tablet 3 Past Week   aspirin EC 81 MG EC tablet Take 1 tablet (81 mg total) by mouth daily. Swallow whole. 30 tablet 0 09/05/2021   carvedilol (COREG) 12.5 MG tablet TAKE 1 TABLET TWICE DAILY 180 tablet 3 09/05/2021   clopidogrel (PLAVIX) 75 MG tablet Take 1 tablet (75 mg total) by mouth daily. 90 tablet 3 09/05/2021   ezetimibe (ZETIA) 10 MG tablet TAKE 1 TABLET EVERY DAY 90 tablet 3 09/05/2021   fluticasone (FLONASE) 50 MCG/ACT nasal spray USE 2 SPRAYS IN EACH NOSTRIL ONCE DAILY USE FOR 4 TO  6 WEEKS, THEN STOP AND USE SEASONALLY OR AS NEEDED. 48 g 0 prn   isosorbide mononitrate (IMDUR) 30 MG 24 hr tablet Take 0.5 tablets (15 mg total) by mouth daily. 45 tablet 3 09/05/2021   lisinopril (ZESTRIL) 40 MG tablet TAKE 1 TABLET EVERY DAY 90 tablet 3 09/05/2021   nitroGLYCERIN (NITROSTAT) 0.4 MG SL tablet Place 1 tablet (0.4 mg total) under the tongue every 5 (five) minutes as needed for chest pain. 30 tablet 0 prn   pantoprazole (PROTONIX) 20 MG tablet Take 1 tablet (20 mg total) by mouth 2 (two) times daily before a meal. 90 tablet 3 09/05/2021   RYBELSUS 14 MG TABS Take 1 tablet (14 mg total) by mouth daily before breakfast. 30 tablet 0 09/05/2021   ACCU-CHEK SMARTVIEW test strip TEST BLOOD SUGAR TWICE DAILY 200 strip 0    acetaminophen (TYLENOL) 500 MG tablet Take 1,000 mg by mouth every 8 (eight) hours as needed.    prn   DROPLET PEN NEEDLES 32G X 4 MM MISC USE WITH [name] INSULIN INJECTION DAILY AS INSTRUCTED 100 each 1    Insulin Glargine ([name] KWIKPEN [place]) Inject 36 Units into the skin at bedtime.   09/04/2021   MITIGARE 0.6 MG CAPS Take for gout flare start Day 1 with 2 tablets then may take 1 extra tablet in 1 hour. Starting Day 2 take 1 tablet daily for up to 7-10 days until resolve. May increase to 1 tablet  twice a day max dose if needed. (Patient not taking: Reported on 06/14/2021) 30 capsule 2 Not Taking    Assessment: Pharmacy consulted to dose heparin in this 71 year old male admitted with ACS/NSTEMI.  No prior anticoag noted.  CrCl = 46.7 ml/min   6/12@0648 : HL 0.49, therapeutic x 1 6/12@1243 : HL 0.51, therapeutic x 2 6/13@0410 : HL 0.33, therapeutic x 3, trending down  Goal of Therapy:  Heparin level 0.3-0.7 units/ml Monitor platelets by anticoagulation protocol: Yes   Plan:  Increase heparin infusion to 1400 units/hr Reconfirm HL in 8 hr, then daily w/ AM labs Continue to monitor H&H and platelets  Renda Rolls, PharmD, [facility] 09/07/2021 5:43 AM

## 2021-09-07 NOTE — Plan of Care (Signed)

## 2021-09-07 NOTE — Assessment & Plan Note (Deleted)
--  Cr 1.81 on presentation, 1.48 the next day which is about baseline.

## 2021-09-08 DIAGNOSIS — I2510 Atherosclerotic heart disease of native coronary artery without angina pectoris: Secondary | ICD-10-CM

## 2021-09-08 DIAGNOSIS — Z9861 Coronary angioplasty status: Secondary | ICD-10-CM

## 2021-09-08 DIAGNOSIS — I10 Essential (primary) hypertension: Secondary | ICD-10-CM | POA: Diagnosis not present

## 2021-09-08 DIAGNOSIS — R7881 Bacteremia: Secondary | ICD-10-CM | POA: Diagnosis not present

## 2021-09-08 DIAGNOSIS — B9620 Unspecified Escherichia coli [E. coli] as the cause of diseases classified elsewhere: Secondary | ICD-10-CM | POA: Diagnosis not present

## 2021-09-08 DIAGNOSIS — I214 Non-ST elevation (NSTEMI) myocardial infarction: Secondary | ICD-10-CM | POA: Diagnosis not present

## 2021-09-08 LAB — TROPONIN I (HIGH SENSITIVITY)
Troponin I (High Sensitivity): 328 ng/L (ref <18)
Troponin I (High Sensitivity): 299 ng/L (ref <18)

## 2021-09-08 LAB — BASIC METABOLIC PANEL
Anion gap: 7 (ref 5–15)
BUN: 17 mg/dL (ref 8–23)
CO2: 27 mmol/L (ref 22–32)
Calcium: 8.8 mg/dL — ABNORMAL LOW (ref 8.9–10.3)
Chloride: 106 mmol/L (ref 98–111)
Creatinine, Ser: 1.28 mg/dL — ABNORMAL HIGH (ref 0.61–1.24)
GFR, Estimated: >60 mL/min (ref >60)
Glucose, Bld: 121 mg/dL — ABNORMAL HIGH (ref 70–99)
Potassium: 3.4 mmol/L — ABNORMAL LOW (ref 3.5–5.1)
Sodium: 140 mmol/L (ref 135–145)

## 2021-09-08 LAB — CBC
HCT: 32.6 % — ABNORMAL LOW (ref 39.0–52.0)
Hemoglobin: 10.4 g/dL — ABNORMAL LOW (ref 13.0–17.0)
MCH: 28.1 pg (ref 26.0–34.0)
MCHC: 31.9 g/dL (ref 30.0–36.0)
MCV: 88.1 fL (ref 80.0–100.0)
Platelets: 217 10*3/uL (ref 150–400)
RBC: 3.7 MIL/uL — ABNORMAL LOW (ref 4.22–5.81)
RDW: 14.7 % (ref 11.5–15.5)
WBC: 4.1 10*3/uL (ref 4.0–10.5)
nRBC: 0 % (ref 0.0–0.2)

## 2021-09-08 LAB — URINE CULTURE: Culture: >=100000 — AB

## 2021-09-08 LAB — GLUCOSE, CAPILLARY
Glucose-Capillary: 121 mg/dL — ABNORMAL HIGH (ref 70–99)
Glucose-Capillary: 174 mg/dL — ABNORMAL HIGH (ref 70–99)
Glucose-Capillary: 218 mg/dL — ABNORMAL HIGH (ref 70–99)
Glucose-Capillary: 175 mg/dL — ABNORMAL HIGH (ref 70–99)

## 2021-09-08 LAB — MAGNESIUM: Magnesium: 2.1 mg/dL (ref 1.7–2.4)

## 2021-09-08 LAB — CULTURE, BLOOD (ROUTINE X 2)

## 2021-09-08 MED ORDER — HYDRALAZINE HCL 20 MG/ML IJ SOLN: 10.0000 mg | INTRAMUSCULAR | Status: DC | PRN

## 2021-09-08 MED ORDER — ISOSORBIDE MONONITRATE ER 30 MG PO TB24
30.0000 mg | ORAL_TABLET | Freq: Every day | ORAL | Status: DC
  Administered 2021-09-09: 30 mg via ORAL
  Filled 2021-09-08: qty 1

## 2021-09-08 MED ORDER — AMLODIPINE BESYLATE 5 MG PO TABS
5.0000 mg | ORAL_TABLET | Freq: Every day | ORAL | Status: DC
  Administered 2021-09-08 – 2021-09-09 (×2): 5 mg via ORAL
  Filled 2021-09-08 (×2): qty 1

## 2021-09-08 MED ORDER — HEPARIN SODIUM (PORCINE) 5000 UNIT/ML IJ SOLN
5000.0000 [IU] | Freq: Three times a day (TID) | INTRAMUSCULAR | Status: DC
  Administered 2021-09-08 – 2021-09-09 (×3): 5000 [IU] via SUBCUTANEOUS
  Filled 2021-09-08 (×3): qty 1

## 2021-09-08 MED ORDER — CEFADROXIL 500 MG PO CAPS
1000.0000 mg | ORAL_CAPSULE | Freq: Two times a day (BID) | ORAL | Status: DC
  Administered 2021-09-08 – 2021-09-09 (×2): 1000 mg via ORAL
  Filled 2021-09-08 (×2): qty 2

## 2021-09-08 NOTE — Progress Notes (Signed)
Progress Note  Patient Name: [name] Date of Encounter: 09/08/2021  [facility] Cardiologist: [name], MD   Subjective   Denies chest pain or shortness of breath.  Denies fevers, feels much better compared to admission  Inpatient Medications    Scheduled Meds:  amLODipine  5 mg Oral Daily   carvedilol  12.5 mg Oral BID WC   cefadroxil  1,000 mg Oral BID   clopidogrel  75 mg Oral Daily   docusate sodium  100 mg Oral BID   ezetimibe  10 mg Oral Daily   fluticasone  1 spray Each Nare Daily   heparin injection (subcutaneous)  5,000 Units Subcutaneous Q8H   insulin aspart  0-15 Units Subcutaneous TID WC   insulin aspart  5 Units Subcutaneous TID WC   insulin glargine-yfgn  22 Units Subcutaneous QHS   [START ON 09/09/2021] isosorbide mononitrate  30 mg Oral Daily   Continuous Infusions:  PRN Meds: acetaminophen, bisacodyl, hydrALAZINE, ondansetron (ZOFRAN) IV, polyethylene glycol   Vital Signs    Vitals:   09/08/21 0434 09/08/21 0745 09/08/21 1002 09/08/21 1133  BP: (!) 190/85 (!) 182/89 (!) 152/75 (!) 153/76  Pulse: 66 65  68  Resp: 18 18  18   Temp: 98.9 F (37.2 C) 97.8 F (36.6 C)  97.9 F (36.6 C)  TempSrc:  Oral  Oral  SpO2: 100% 98%  98%  Weight:      Height:        Intake/Output Summary (Last 24 hours) at 09/08/2021 1553 Last data filed at 09/08/2021 1030 Gross per 24 hour  Intake 720 ml  Output 450 ml  Net 270 ml      09/05/2021    8:39 PM 08/12/2021    8:43 AM 06/09/2021    6:45 AM  Last 3 Weights  Weight (lbs) 230 lb 230 lb 12.8 oz 230 lb 6.4 oz  Weight (kg) 104.327 kg 104.69 kg 104.509 kg      Telemetry    Off telemetry- Personally Reviewed  ECG     - Personally Reviewed  Physical Exam   GEN: No acute distress.   Neck: No JVD Cardiac: RRR, no murmurs, rubs, or gallops.  Respiratory: Clear to auscultation bilaterally. GI: Soft, nontender, non-distended  MS: No edema; No deformity. Neuro:  Nonfocal  Psych:  Normal affect   Labs    High Sensitivity Troponin:   Recent Labs  Lab 09/06/21 0030 09/06/21 0648 09/06/21 0946 09/08/21 0917 09/08/21 1112  TROPONINIHS 844* 1,203* 1,479* 328* 299*     Chemistry Recent Labs  Lab 09/05/21 2045 09/07/21 0410 09/08/21 0502  NA 142 138 140  K 5.0 3.5 3.4*  CL 106 109 106  CO2 23 23 27   GLUCOSE 167* 149* 121*  BUN 19 20 17   CREATININE 1.81* 1.48* 1.28*  CALCIUM 9.3 8.4* 8.8*  MG  --  2.2 2.1  PROT 8.3*  --   --   ALBUMIN 4.1  --   --   AST 31  --   --   ALT 20  --   --   ALKPHOS 70  --   --   BILITOT 1.3*  --   --   GFRNONAA 40* 51* >60  ANIONGAP 13 6 7     Lipids No results for input(s): "CHOL", "TRIG", "HDL", "LABVLDL", "LDLCALC", "CHOLHDL" in the last 168 hours.  Hematology Recent Labs  Lab 09/05/21 2045 09/07/21 0410 09/08/21 0502  WBC 5.2 4.3 4.1  RBC 4.63 3.63* 3.70*  HGB 13.2 10.4* 10.4*  HCT 42.5 32.0* 32.6*  MCV 91.8 88.2 88.1  MCH 28.5 28.7 28.1  MCHC 31.1 32.5 31.9  RDW 15.2 15.2 14.7  PLT 258 170 217   Thyroid No results for input(s): "TSH", "FREET4" in the last 168 hours.  BNP Recent Labs  Lab 09/05/21 2045  BNP 71.6    DDimer  Recent Labs  Lab 09/05/21 2045  DDIMER 2.61*     Radiology    ECHOCARDIOGRAM COMPLETE  Result Date: 09/07/2021    ECHOCARDIOGRAM REPORT   Patient Name:   [name] Date of Exam: 09/07/2021 Medical Rec #:  [id number]          Height:       71.0 in Accession #:    [id number]         Weight:       230.0 lb Date of Birth:  10/22/1950           BSA:          2.238 m Patient Age:    71 years           BP:           177/90 mmHg Patient Gender: M                  HR:           74 bpm. Exam Location:  [facility] Procedure: 2D Echo, Cardiac Doppler, Color Doppler and Intracardiac            Opacification Agent Indications:     NSTEMI I21.4  History:         Patient has prior history of Echocardiogram examinations, most                  recent 12/29/2019. CAD; Risk Factors:Hypertension and  Diabetes.                  NSTEMI, PE.  Sonographer:     Cristela Blue Referring Phys:  [id number] SHERI HAMMOCK Diagnosing Phys: Yvonne Kendall MD  Sonographer Comments: Suboptimal apical window. IMPRESSIONS  1. Left ventricular ejection fraction, by estimation, is 60 to 65%. The left ventricle has normal function. The left ventricle has no regional wall motion abnormalities. There is mild left ventricular hypertrophy. Left ventricular diastolic parameters are consistent with Grade II diastolic dysfunction (pseudonormalization). Elevated left atrial pressure.  2. Right ventricular systolic function is normal. The right ventricular size is mildly enlarged. Tricuspid regurgitation signal is inadequate for assessing PA pressure.  3. Right atrial size was mildly dilated.  4. The mitral valve is normal in structure. No evidence of mitral valve regurgitation. No evidence of mitral stenosis.  5. The aortic valve is tricuspid. Aortic valve regurgitation is not visualized. No aortic stenosis is present.  6. The inferior vena cava is dilated in size with <50% respiratory variability, suggesting right atrial pressure of 15 mmHg. FINDINGS  Left Ventricle: Left ventricular ejection fraction, by estimation, is 60 to 65%. The left ventricle has normal function. The left ventricle has no regional wall motion abnormalities. Definity contrast agent was given IV to delineate the left ventricular  endocardial borders. The left ventricular internal cavity size was normal in size. There is mild left ventricular hypertrophy. Left ventricular diastolic parameters are consistent with Grade II diastolic dysfunction (pseudonormalization). Elevated left atrial pressure. Right Ventricle: The right ventricular size is mildly enlarged. No increase in right ventricular wall thickness. Right ventricular systolic function is normal. Tricuspid  regurgitation signal is inadequate for assessing PA pressure. Left Atrium: Left atrial size was normal in size.  Right Atrium: Right atrial size was mildly dilated. Pericardium: Trivial pericardial effusion is present. Mitral Valve: The mitral valve is normal in structure. No evidence of mitral valve regurgitation. No evidence of mitral valve stenosis. MV peak gradient, 7.3 mmHg. The mean mitral valve gradient is 3.0 mmHg. Tricuspid Valve: The tricuspid valve is grossly normal. Tricuspid valve regurgitation is trivial. Aortic Valve: The aortic valve is tricuspid. Aortic valve regurgitation is not visualized. No aortic stenosis is present. Aortic valve mean gradient measures 3.0 mmHg. Aortic valve peak gradient measures 6.1 mmHg. Aortic valve area, by VTI measures 3.16 cm. Pulmonic Valve: The pulmonic valve was grossly normal. Pulmonic valve regurgitation is not visualized. No evidence of pulmonic stenosis. Aorta: The aortic root is normal in size and structure. Pulmonary Artery: The pulmonary artery is of normal size. Venous: The inferior vena cava is dilated in size with less than 50% respiratory variability, suggesting right atrial pressure of 15 mmHg. IAS/Shunts: The interatrial septum was not well visualized.  LEFT VENTRICLE PLAX 2D LVIDd:         4.50 cm   Diastology LVIDs:         2.90 cm   LV e' medial:    5.44 cm/s LV PW:         1.30 cm   LV E/e' medial:  24.1 LV IVS:        1.07 cm   LV e' lateral:   5.87 cm/s LVOT diam:     2.00 cm   LV E/e' lateral: 22.3 LV SV:         66 LV SV Index:   29 LVOT Area:     3.14 cm  RIGHT VENTRICLE RV Basal diam:  4.30 cm RV S prime:     14.90 cm/s TAPSE (M-mode): 2.8 cm LEFT ATRIUM             Index        RIGHT ATRIUM           Index LA diam:        4.00 cm 1.79 cm/m   RA Area:     25.70 cm LA Vol (A2C):   41.7 ml 18.64 ml/m  RA Volume:   89.80 ml  40.13 ml/m LA Vol (A4C):   48.5 ml 21.68 ml/m LA Biplane Vol: 48.0 ml 21.45 ml/m  AORTIC VALVE AV Area (Vmax):    2.45 cm AV Area (Vmean):   2.78 cm AV Area (VTI):     3.16 cm AV Vmax:           123.00 cm/s AV Vmean:           78.700 cm/s AV VTI:            0.208 m AV Peak Grad:      6.1 mmHg AV Mean Grad:      3.0 mmHg LVOT Vmax:         96.00 cm/s LVOT Vmean:        69.600 cm/s LVOT VTI:          0.209 m LVOT/AV VTI ratio: 1.00  AORTA Ao Root diam: 3.00 cm MITRAL VALVE MV Area (PHT): 3.53 cm     SHUNTS MV Area VTI:   1.94 cm     Systemic VTI:  0.21 m MV Peak grad:  7.3 mmHg     Systemic Diam: 2.00 cm MV  Mean grad:  3.0 mmHg MV Vmax:       1.35 m/s MV Vmean:      83.2 cm/s MV Decel Time: 215 msec MV E velocity: 131.00 cm/s MV A velocity: 119.00 cm/s MV E/A ratio:  1.10 Harrell Gave End MD Electronically signed by Nelva Bush MD Signature Date/Time: 09/07/2021/12:57:47 PM    Final     Cardiac Studies   Echocardiogram 09/07/2021 EF 60 to 65%  Patient Profile     71 y.o. male with history of CAD/PCI to RCA in 2021, hypertension, CKD 3, PE presenting with worsening chest pain, fever, chills found to have UTI induced sepsis and NSTEMI.  Assessment & Plan    NSTEMI hx of CAD/PCI -Denies chest pain -S/p heparin x48 hours -Plan left heart cath as outpatient after management of infection, echo with normal EF -Continue aspirin, Plavix, Imdur  2.  Hypertension -Start Norvasc 5 mg daily, continue Coreg 12.5 mg bid, Imdur. -Creatinine improving with holding lisinopril.  Patient can be discharged from cardiac perspective on current medications.  Close follow-up as outpatient advised.  We will plan left heart cath as outpatient.  Total encounter time more than 50 minutes  Greater than 50% was spent in counseling and coordination of care with the patient      Signed, [name], MD  09/08/2021, 3:53 PM

## 2021-09-08 NOTE — Plan of Care (Signed)

## 2021-09-08 NOTE — Progress Notes (Signed)
PROGRESS NOTE    [name]  [id number] DOB: Oct 03, 1950 DOA: 09/05/2021 PCP: [name], DO    Brief Narrative:  71 y.o. male with medical history significant of diabetes mellitus type 2, hypertension, pulmonary embolism, CKD coming to [place] with fevers chills.  Patient also reports chest pain and shortness of breath.  Patient also describes lower abdominal pain with dysuria frequency that is been going on at least for the past 2 weeks.    Blood cultures positive for E. coli.  Sensitivities pending as of 6/14.  No plans for inpatient ischemic evaluation   Assessment & Plan:   Principal Problem:   E coli bacteremia Active Problems:   Severe sepsis (HCC)   Type 2 diabetes mellitus with other specified complication (HCC)   HTN (hypertension)   Acute kidney injury superimposed on CKD (HCC)   NSTEMI (non-ST elevated myocardial infarction) ([place])   CAD S/P percutaneous coronary angioplasty   Stage 3a chronic kidney disease (CKD) (HCC)  * E coli bacteremia --likely from urinary source --started on vanc/cefe/flagyl on presentation, de-escalated to ceftriaxone Plan: Continue ceftriaxone for now.  Follow-up blood culture and sensitivities.  De-escalate antibiotics as appropriate.  Possible discharge 6/15.   Severe sepsis (HCC) --fever, tachycardia, lactic acidosis, source E coli bacteremia  sepsis physiology improving     Type 2 diabetes mellitus with other specified complication ([place]) --cont home long-acting as glargine 22u nightly -- Continue mealtime 5u TID --SSI TID     HTN (hypertension) --cont coreg and Imdur   CAD S/P percutaneous coronary angioplasty -Patient with PCI in October 2021 with a drug-eluting stent placement to the RCA No plans for inpatient ischemic evaluation Plan: -Continue Plavix  -Continue Imdur -Continue carvedilol --cont Zetia (statin intolerant)   NSTEMI (non-ST elevated myocardial infarction) ([place]) --trop normal on  presentation, trended up to 1400's the next day. --started on heparin gtt Plan: --defer catheterization until bacteremia has been adequately treated --cont heparin gtt for 48 hours total, completed --cont plavix   Acute kidney injury superimposed on CKD ([place]) On CKD 3a --Cr 1.81 on presentation, 1.48 the next day which is about baseline.     --hold home Lisinopril for now, can likely restart on discharge    DVT prophylaxis: SQ heparin Code Status: Full Family Communication: None today Disposition Plan: Status is: Inpatient Remains inpatient appropriate because: Resolving sepsis secondary to E. coli bacteremia.  Anticipate discharge 6/15   Level of care: Progressive  Consultants:  Cardiology-[facility]  Procedures:  None  Antimicrobials: Ceftriaxone   Subjective: Seen and examined.  Sitting up in bed.  No visible distress.  No complaints of pain  Objective: Vitals:   09/08/21 0434 09/08/21 0745 09/08/21 1002 09/08/21 1133  BP: (!) 190/85 (!) 182/89 (!) 152/75 (!) 153/76  Pulse: 66 65  68  Resp: 18 18  18   Temp: 98.9 F (37.2 C) 97.8 F (36.6 C)  97.9 F (36.6 C)  TempSrc:  Oral  Oral  SpO2: 100% 98%  98%  Weight:      Height:        Intake/Output Summary (Last 24 hours) at 09/08/2021 1322 Last data filed at 09/08/2021 1030 Gross per 24 hour  Intake 720 ml  Output 450 ml  Net 270 ml   Filed Weights   09/05/21 2039  Weight: 104.3 kg    Examination:  General exam: Appears calm and comfortable  Respiratory system: Clear to auscultation. Respiratory effort normal. Cardiovascular system: S1-S2, RRR, 1/6 murmur, no pedal edema  Gastrointestinal system: Soft, NT/ND, normal bowel sounds Central nervous system: Alert and oriented. No focal neurological deficits. Extremities: Symmetric 5 x 5 power. Skin: No rashes, lesions or ulcers Psychiatry: Judgement and insight appear normal. Mood & affect appropriate.     Data Reviewed: I have personally reviewed  following labs and imaging studies  CBC: Recent Labs  Lab 09/05/21 2045 09/07/21 0410 09/08/21 0502  WBC 5.2 4.3 4.1  HGB 13.2 10.4* 10.4*  HCT 42.5 32.0* 32.6*  MCV 91.8 88.2 88.1  PLT 258 170 [id number]   Basic Metabolic Panel: Recent Labs  Lab 09/05/21 2045 09/07/21 0410 09/08/21 0502  NA 142 138 140  K 5.0 3.5 3.4*  CL 106 109 106  CO2 23 23 27   GLUCOSE 167* 149* 121*  BUN 19 20 17   CREATININE 1.81* 1.48* 1.28*  CALCIUM 9.3 8.4* 8.8*  MG  --  2.2 2.1   GFR: Estimated Creatinine Clearance: 66 mL/min (A) (by C-G formula based on SCr of 1.28 mg/dL (H)). Liver Function Tests: Recent Labs  Lab 09/05/21 2045  AST 31  ALT 20  ALKPHOS 70  BILITOT 1.3*  PROT 8.3*  ALBUMIN 4.1   No results for input(s): "LIPASE", "AMYLASE" in the last 168 hours. No results for input(s): "AMMONIA" in the last 168 hours. Coagulation Profile: Recent Labs  Lab 09/05/21 2045  INR 1.1   Cardiac Enzymes: No results for input(s): "CKTOTAL", "CKMB", "CKMBINDEX", "TROPONINI" in the last 168 hours. BNP (last 3 results) No results for input(s): "PROBNP" in the last 8760 hours. HbA1C: No results for input(s): "HGBA1C" in the last 72 hours. CBG: Recent Labs  Lab 09/07/21 1127 09/07/21 1644 09/07/21 2143 09/08/21 0750 09/08/21 1132  GLUCAP 225* 237* 119* 121* 174*   Lipid Profile: No results for input(s): "CHOL", "HDL", "LDLCALC", "TRIG", "CHOLHDL", "LDLDIRECT" in the last 72 hours. Thyroid Function Tests: No results for input(s): "TSH", "T4TOTAL", "FREET4", "T3FREE", "THYROIDAB" in the last 72 hours. Anemia Panel: No results for input(s): "VITAMINB12", "FOLATE", "FERRITIN", "TIBC", "IRON", "RETICCTPCT" in the last 72 hours. Sepsis Labs: Recent Labs  Lab 09/05/21 2045 09/05/21 2233 09/06/21 0648  PROCALCITON  --   --  24.87  LATICACIDVEN 5.7* 1.4  --     Recent Results (from the past 240 hour(s))  Urine Culture     Status: Abnormal   Collection Time: 09/05/21  8:52 PM    Specimen: In/Out Cath Urine  Result Value Ref Range Status   Specimen Description   Final    IN/OUT CATH URINE Performed at [facility], [address] [place], [place] [address]    Special Requests   Final    NONE Performed at [facility], [address] [place], [place] [address]    Culture >=100,000 COLONIES/mL ESCHERICHIA COLI (A)  Final   Report Status 09/08/2021 FINAL  Final   Organism ID, Bacteria ESCHERICHIA COLI (A)  Final      Susceptibility   Escherichia coli - MIC*    AMPICILLIN >=32 RESISTANT Resistant     CEFAZOLIN <=4 SENSITIVE Sensitive     CEFEPIME <=0.12 SENSITIVE Sensitive     CEFTRIAXONE <=0.25 SENSITIVE Sensitive     CIPROFLOXACIN <=0.25 SENSITIVE Sensitive     GENTAMICIN <=1 SENSITIVE Sensitive     IMIPENEM <=0.25 SENSITIVE Sensitive     NITROFURANTOIN <=16 SENSITIVE Sensitive     TRIMETH/SULFA >=320 RESISTANT Resistant     AMPICILLIN/SULBACTAM >=32 RESISTANT Resistant     PIP/TAZO <=4 SENSITIVE Sensitive     * >=  100,000 COLONIES/mL ESCHERICHIA COLI  Blood Culture (routine x 2)     Status: Abnormal   Collection Time: 09/05/21  8:59 PM   Specimen: BLOOD  Result Value Ref Range Status   Specimen Description   Final    BLOOD RAC Performed at [facility], [address], [place], [place] [address]    Special Requests   Final    BOTTLES DRAWN AEROBIC AND ANAEROBIC BCAV Performed at [facility], [address] [place], [place] [address]    Culture  Setup Time   Final    GRAM NEGATIVE RODS AEROBIC BOTTLE ONLY CRITICAL VALUE NOTED.  VALUE IS CONSISTENT WITH PREVIOUSLY REPORTED AND CALLED VALUE. Performed at [facility], [address] [place], [place] [address]    Culture (A)  Final    ESCHERICHIA COLI SUSCEPTIBILITIES PERFORMED ON PREVIOUS CULTURE WITHIN THE LAST 5 DAYS. Performed at [facility], [place] [address] [place], [place] [address]    Report Status 09/08/2021 FINAL  Final  Blood  Culture (routine x 2)     Status: Abnormal   Collection Time: 09/05/21  9:04 PM   Specimen: BLOOD  Result Value Ref Range Status   Specimen Description   Final    BLOOD LAC Performed at [facility], [address] [place], [place] [address]    Special Requests   Final    BOTTLES DRAWN AEROBIC AND ANAEROBIC BCAV Performed at [facility], [address] [place], [place] [address]    Culture  Setup Time   Final    GRAM NEGATIVE RODS IN BOTH AEROBIC AND ANAEROBIC BOTTLES Organism ID to follow CRITICAL RESULT CALLED TO, READ BACK BY AND VERIFIED WITH: [facility] MITCHELL 09/06/21 1225 MU Performed at [facility], [address] [place], [place] [address]    Culture ESCHERICHIA COLI (A)  Final   Report Status 09/08/2021 FINAL  Final   Organism ID, Bacteria ESCHERICHIA COLI  Final      Susceptibility   Escherichia coli - MIC*    AMPICILLIN >=32 RESISTANT Resistant     CEFAZOLIN <=4 SENSITIVE Sensitive     CEFEPIME <=0.12 SENSITIVE Sensitive     CEFTAZIDIME <=1 SENSITIVE Sensitive     CEFTRIAXONE <=0.25 SENSITIVE Sensitive     CIPROFLOXACIN <=0.25 SENSITIVE Sensitive     GENTAMICIN <=1 SENSITIVE Sensitive     IMIPENEM <=0.25 SENSITIVE Sensitive     TRIMETH/SULFA >=320 RESISTANT Resistant     AMPICILLIN/SULBACTAM >=32 RESISTANT Resistant     PIP/TAZO <=4 SENSITIVE Sensitive     * ESCHERICHIA COLI  Blood Culture ID Panel (Reflexed)     Status: Abnormal   Collection Time: 09/05/21  9:04 PM  Result Value Ref Range Status   Enterococcus faecalis NOT DETECTED NOT DETECTED Final   Enterococcus Faecium NOT DETECTED NOT DETECTED Final   Listeria monocytogenes NOT DETECTED NOT DETECTED Final   Staphylococcus species NOT DETECTED NOT DETECTED Final   Staphylococcus aureus (BCID) NOT DETECTED NOT DETECTED Final   Staphylococcus epidermidis NOT DETECTED NOT DETECTED Final   Staphylococcus lugdunensis NOT DETECTED NOT DETECTED Final   Streptococcus species NOT  DETECTED NOT DETECTED Final   Streptococcus agalactiae NOT DETECTED NOT DETECTED Final   Streptococcus pneumoniae NOT DETECTED NOT DETECTED Final   Streptococcus pyogenes NOT DETECTED NOT DETECTED Final   A.calcoaceticus-baumannii NOT DETECTED NOT DETECTED Final   Bacteroides fragilis NOT DETECTED NOT DETECTED Final   Enterobacterales DETECTED (A) NOT DETECTED Final    Comment:  Enterobacterales represent a large order of gram negative bacteria, not a single organism. CRITICAL RESULT CALLED TO, READ BACK BY AND VERIFIED WITH: DEVON MITCHELL 09/06/21 1225 MU    Enterobacter cloacae complex NOT DETECTED NOT DETECTED Final   Escherichia coli DETECTED (A) NOT DETECTED Final    Comment: CRITICAL RESULT CALLED TO, READ BACK BY AND VERIFIED WITH: DEVAN MITCHELL 09/06/21 1225 MU    Klebsiella aerogenes NOT DETECTED NOT DETECTED Final   Klebsiella oxytoca NOT DETECTED NOT DETECTED Final   Klebsiella pneumoniae NOT DETECTED NOT DETECTED Final   Proteus species NOT DETECTED NOT DETECTED Final   Salmonella species NOT DETECTED NOT DETECTED Final   Serratia marcescens NOT DETECTED NOT DETECTED Final   Haemophilus influenzae NOT DETECTED NOT DETECTED Final   Neisseria meningitidis NOT DETECTED NOT DETECTED Final   Pseudomonas aeruginosa NOT DETECTED NOT DETECTED Final   Stenotrophomonas maltophilia NOT DETECTED NOT DETECTED Final   Candida albicans NOT DETECTED NOT DETECTED Final   Candida auris NOT DETECTED NOT DETECTED Final   Candida glabrata NOT DETECTED NOT DETECTED Final   Candida krusei NOT DETECTED NOT DETECTED Final   Candida parapsilosis NOT DETECTED NOT DETECTED Final   Candida tropicalis NOT DETECTED NOT DETECTED Final   Cryptococcus neoformans/gattii NOT DETECTED NOT DETECTED Final   CTX-M ESBL NOT DETECTED NOT DETECTED Final   Carbapenem resistance IMP NOT DETECTED NOT DETECTED Final   Carbapenem resistance KPC NOT DETECTED NOT DETECTED Final   Carbapenem resistance NDM NOT DETECTED  NOT DETECTED Final   Carbapenem resist OXA 48 LIKE NOT DETECTED NOT DETECTED Final   Carbapenem resistance VIM NOT DETECTED NOT DETECTED Final    Comment: Performed at [facility], [address] [place], [place] [address]  Resp Panel by RT-PCR (Flu A&B, Covid) Anterior Nasal Swab     Status: None   Collection Time: 09/05/21  9:17 PM   Specimen: Anterior Nasal Swab  Result Value Ref Range Status   SARS Coronavirus 2 by RT PCR NEGATIVE NEGATIVE Final    Comment: (NOTE) SARS-CoV-2 target nucleic acids are NOT DETECTED.  The SARS-CoV-2 RNA is generally detectable in upper respiratory specimens during the acute phase of infection. The lowest concentration of SARS-CoV-2 viral copies this assay can detect is 138 copies/mL. A negative result does not preclude SARS-Cov-2 infection and should not be used as the sole basis for treatment or other patient management decisions. A negative result may occur with  improper specimen collection/handling, submission of specimen other than nasopharyngeal swab, presence of viral mutation(s) within the areas targeted by this assay, and inadequate number of viral copies(<138 copies/mL). A negative result must be combined with clinical observations, patient history, and epidemiological information. The expected result is Negative.  Fact Sheet for Patients:  EntrepreneurPulse.com.au  Fact Sheet for Healthcare Providers:  IncredibleEmployment.be  This test is no t yet approved or cleared by the [place] FDA and  has been authorized for detection and/or diagnosis of SARS-CoV-2 by FDA under an Emergency Use Authorization (EUA). This EUA will remain  in effect (meaning this test can be used) for the duration of the COVID-19 declaration under Section 564(b)(1) of the Act, 21 U.S.C.section 360bbb-3(b)(1), unless the authorization is terminated  or revoked sooner.       Influenza A by PCR NEGATIVE NEGATIVE  Final   Influenza B by PCR NEGATIVE NEGATIVE Final    Comment: (NOTE) The Xpert Xpress SARS-CoV-2/FLU/RSV plus assay is intended as an aid in the diagnosis of influenza from  Nasopharyngeal swab specimens and should not be used as a sole basis for treatment. Nasal washings and aspirates are unacceptable for Xpert Xpress SARS-CoV-2/FLU/RSV testing.  Fact Sheet for Patients: EntrepreneurPulse.com.au  Fact Sheet for Healthcare Providers: IncredibleEmployment.be  This test is not yet approved or cleared by the [place] FDA and has been authorized for detection and/or diagnosis of SARS-CoV-2 by FDA under an Emergency Use Authorization (EUA). This EUA will remain in effect (meaning this test can be used) for the duration of the COVID-19 declaration under Section 564(b)(1) of the Act, 21 U.S.C. section 360bbb-3(b)(1), unless the authorization is terminated or revoked.  Performed at [facility], [address] [place], [place] [address]          Radiology Studies: ECHOCARDIOGRAM COMPLETE  Result Date: 09/07/2021    ECHOCARDIOGRAM REPORT   Patient Name:   ISAO GRANADO Dolecki Date of Exam: 09/07/2021 Medical Rec #:  [id number]          Height:       71.0 in Accession #:    [id number]         Weight:       230.0 lb Date of Birth:  [birth year]           BSA:          2.238 m Patient Age:    61 years           BP:           177/90 mmHg Patient Gender: M                  HR:           74 bpm. Exam Location:  [facility] Procedure: 2D Echo, Cardiac Doppler, Color Doppler and Intracardiac            Opacification Agent Indications:     NSTEMI I21.4  History:         Patient has prior history of Echocardiogram examinations, most                  recent 12/29/2019. CAD; Risk Factors:Hypertension and Diabetes.                  NSTEMI, PE.  Sonographer:     Sherrie Sport Referring Phys:  [id number] SHERI HAMMOCK Diagnosing Phys: Nelva Bush MD  Sonographer Comments:  Suboptimal apical window. IMPRESSIONS  1. Left ventricular ejection fraction, by estimation, is 60 to 65%. The left ventricle has normal function. The left ventricle has no regional wall motion abnormalities. There is mild left ventricular hypertrophy. Left ventricular diastolic parameters are consistent with Grade II diastolic dysfunction (pseudonormalization). Elevated left atrial pressure.  2. Right ventricular systolic function is normal. The right ventricular size is mildly enlarged. Tricuspid regurgitation signal is inadequate for assessing PA pressure.  3. Right atrial size was mildly dilated.  4. The mitral valve is normal in structure. No evidence of mitral valve regurgitation. No evidence of mitral stenosis.  5. The aortic valve is tricuspid. Aortic valve regurgitation is not visualized. No aortic stenosis is present.  6. The inferior vena cava is dilated in size with <50% respiratory variability, suggesting right atrial pressure of 15 mmHg. FINDINGS  Left Ventricle: Left ventricular ejection fraction, by estimation, is 60 to 65%. The left ventricle has normal function. The left ventricle has no regional wall motion abnormalities. Definity contrast agent was given IV to delineate the left ventricular  endocardial borders. The left ventricular internal cavity size  was normal in size. There is mild left ventricular hypertrophy. Left ventricular diastolic parameters are consistent with Grade II diastolic dysfunction (pseudonormalization). Elevated left atrial pressure. Right Ventricle: The right ventricular size is mildly enlarged. No increase in right ventricular wall thickness. Right ventricular systolic function is normal. Tricuspid regurgitation signal is inadequate for assessing PA pressure. Left Atrium: Left atrial size was normal in size. Right Atrium: Right atrial size was mildly dilated. Pericardium: Trivial pericardial effusion is present. Mitral Valve: The mitral valve is normal in structure. No  evidence of mitral valve regurgitation. No evidence of mitral valve stenosis. MV peak gradient, 7.3 mmHg. The mean mitral valve gradient is 3.0 mmHg. Tricuspid Valve: The tricuspid valve is grossly normal. Tricuspid valve regurgitation is trivial. Aortic Valve: The aortic valve is tricuspid. Aortic valve regurgitation is not visualized. No aortic stenosis is present. Aortic valve mean gradient measures 3.0 mmHg. Aortic valve peak gradient measures 6.1 mmHg. Aortic valve area, by VTI measures 3.16 cm. Pulmonic Valve: The pulmonic valve was grossly normal. Pulmonic valve regurgitation is not visualized. No evidence of pulmonic stenosis. Aorta: The aortic root is normal in size and structure. Pulmonary Artery: The pulmonary artery is of normal size. Venous: The inferior vena cava is dilated in size with less than 50% respiratory variability, suggesting right atrial pressure of 15 mmHg. IAS/Shunts: The interatrial septum was not well visualized.  LEFT VENTRICLE PLAX 2D LVIDd:         4.50 cm   Diastology LVIDs:         2.90 cm   LV e' medial:    5.44 cm/s LV PW:         1.30 cm   LV E/e' medial:  24.1 LV IVS:        1.07 cm   LV e' lateral:   5.87 cm/s LVOT diam:     2.00 cm   LV E/e' lateral: 22.3 LV SV:         66 LV SV Index:   29 LVOT Area:     3.14 cm  RIGHT VENTRICLE RV Basal diam:  4.30 cm RV S prime:     14.90 cm/s TAPSE (M-mode): 2.8 cm LEFT ATRIUM             Index        RIGHT ATRIUM           Index LA diam:        4.00 cm 1.79 cm/m   RA Area:     25.70 cm LA Vol (A2C):   41.7 ml 18.64 ml/m  RA Volume:   89.80 ml  40.13 ml/m LA Vol (A4C):   48.5 ml 21.68 ml/m LA Biplane Vol: 48.0 ml 21.45 ml/m  AORTIC VALVE AV Area (Vmax):    2.45 cm AV Area (Vmean):   2.78 cm AV Area (VTI):     3.16 cm AV Vmax:           123.00 cm/s AV Vmean:          78.700 cm/s AV VTI:            0.208 m AV Peak Grad:      6.1 mmHg AV Mean Grad:      3.0 mmHg LVOT Vmax:         96.00 cm/s LVOT Vmean:        69.600 cm/s LVOT VTI:           0.209 m LVOT/AV VTI ratio: 1.00  AORTA Ao Root diam: 3.00 cm MITRAL VALVE MV Area (PHT): 3.53 cm     SHUNTS MV Area VTI:   1.94 cm     Systemic VTI:  0.21 m MV Peak grad:  7.3 mmHg     Systemic Diam: 2.00 cm MV Mean grad:  3.0 mmHg MV Vmax:       1.35 m/s MV Vmean:      83.2 cm/s MV Decel Time: 215 msec MV E velocity: 131.00 cm/s MV A velocity: 119.00 cm/s MV E/A ratio:  1.10 Harrell Gave End MD Electronically signed by Nelva Bush MD Signature Date/Time: 09/07/2021/12:57:47 PM    Final         Scheduled Meds:  amLODipine  5 mg Oral Daily   carvedilol  12.5 mg Oral BID WC   cefadroxil  1,000 mg Oral BID   clopidogrel  75 mg Oral Daily   docusate sodium  100 mg Oral BID   ezetimibe  10 mg Oral Daily   fluticasone  1 spray Each Nare Daily   insulin aspart  0-15 Units Subcutaneous TID WC   insulin aspart  5 Units Subcutaneous TID WC   insulin glargine-yfgn  22 Units Subcutaneous QHS   [START ON 09/09/2021] isosorbide mononitrate  30 mg Oral Daily   Continuous Infusions:   LOS: 3 days      Sidney Ace, MD [facility]   If 7PM-7AM, please contact night-coverage  09/08/2021, 1:22 PM

## 2021-09-09 ENCOUNTER — Telehealth: Payer: Self-pay

## 2021-09-09 DIAGNOSIS — B9620 Unspecified Escherichia coli [E. coli] as the cause of diseases classified elsewhere: Secondary | ICD-10-CM | POA: Diagnosis not present

## 2021-09-09 DIAGNOSIS — R7881 Bacteremia: Secondary | ICD-10-CM | POA: Diagnosis not present

## 2021-09-09 LAB — GLUCOSE, CAPILLARY: Glucose-Capillary: 122 mg/dL — ABNORMAL HIGH (ref 70–99)

## 2021-09-09 LAB — BASIC METABOLIC PANEL
Anion gap: 5 (ref 5–15)
BUN: 16 mg/dL (ref 8–23)
CO2: 30 mmol/L (ref 22–32)
Calcium: 9.3 mg/dL (ref 8.9–10.3)
Chloride: 106 mmol/L (ref 98–111)
Creatinine, Ser: 1.22 mg/dL (ref 0.61–1.24)
GFR, Estimated: >60 mL/min (ref >60)
Glucose, Bld: 115 mg/dL — ABNORMAL HIGH (ref 70–99)
Potassium: 3.8 mmol/L (ref 3.5–5.1)
Sodium: 141 mmol/L (ref 135–145)

## 2021-09-09 LAB — CBC
HCT: 36.3 % — ABNORMAL LOW (ref 39.0–52.0)
Hemoglobin: 11.6 g/dL — ABNORMAL LOW (ref 13.0–17.0)
MCH: 28 pg (ref 26.0–34.0)
MCHC: 32 g/dL (ref 30.0–36.0)
MCV: 87.5 fL (ref 80.0–100.0)
Platelets: 238 10*3/uL (ref 150–400)
RBC: 4.15 MIL/uL — ABNORMAL LOW (ref 4.22–5.81)
RDW: 14.8 % (ref 11.5–15.5)
WBC: 5.5 10*3/uL (ref 4.0–10.5)
nRBC: 0 % (ref 0.0–0.2)

## 2021-09-09 LAB — MAGNESIUM: Magnesium: 2.2 mg/dL (ref 1.7–2.4)

## 2021-09-09 MED ORDER — CEFADROXIL 500 MG PO CAPS: 1000.0000 mg | ORAL_CAPSULE | Freq: Two times a day (BID) | ORAL | 0 refills | Status: DC

## 2021-09-09 NOTE — Discharge Summary (Signed)
Physician Discharge Summary  [name] [id number] DOB: [birth year] DOA: 09/05/2021  PCP: [name], DO  Admit date: 09/05/2021 Discharge date: 09/09/2021  Admitted From: Home Disposition: Home  Recommendations for Outpatient Follow-up:  Follow up with PCP in 1-2 weeks Follow-up outpatient cardiology as directed  Home Health: No Equipment/Devices: None  Discharge Condition: Stable CODE STATUS: Full Diet recommendation: Heart healthy/carb modified  Brief/Interim Summary:  71 y.o. male with medical history significant of diabetes mellitus type 2, hypertension, pulmonary embolism, CKD coming to [place] with fevers chills.  Patient also reports chest pain and shortness of breath.  Patient also describes lower abdominal pain with dysuria frequency that is been going on at least for the past 2 weeks.     Blood cultures positive for E. coli.  Pansensitive.  Transition to oral antibiotics.  No plans for inpatient ischemic evaluation.  Discharge home to complete antibiotic course.  Follow-up outpatient cardiology.    Discharge Diagnoses:  Principal Problem:   E coli bacteremia Active Problems:   Severe sepsis (HCC)   Type 2 diabetes mellitus with other specified complication (HCC)   HTN (hypertension)   Acute kidney injury superimposed on CKD (HCC)   NSTEMI (non-ST elevated myocardial infarction) ([place])   CAD S/P percutaneous coronary angioplasty   Stage 3a chronic kidney disease (CKD) (HCC) * E coli bacteremia --likely from urinary source --started on vanc/cefe/flagyl on presentation, de-escalated to ceftriaxone Plan: Was on Rocephin in house.  Transition to cefadroxil at time of discharge.   Severe sepsis (HCC) --fever, tachycardia, lactic acidosis, source E coli bacteremia  sepsis physiology resolved at time of discharge     Type 2 diabetes mellitus with other specified complication ([place]) --cont home long-acting as glargine 22u nightly -- Continue  mealtime 5u TID      HTN (hypertension) --cont coreg and Imdur   CAD S/P percutaneous coronary angioplasty -Patient with PCI in October 2021 with a drug-eluting stent placement to the RCA No plans for inpatient ischemic evaluation Plan: -Continue Plavix  -Continue Imdur -Continue carvedilol --cont Zetia (statin intolerant)   NSTEMI (non-ST elevated myocardial infarction) ([place]) --trop normal on presentation, trended up to 1400's the next day. --started on heparin gtt Plan: --defer catheterization until bacteremia has been adequately treated --cont heparin gtt for 48 hours total, completed --cont plavix --Follow-up outpatient PCP and cardiology   Acute kidney injury superimposed on CKD ([place]) On CKD 3a --Cr 1.81 on presentation, 1.48 the next day which is about baseline.     -- Creatinine baseline.  Resume his lisinopril at time of discharge   Discharge Instructions  Discharge Instructions     Diet - low sodium heart healthy   Complete by: As directed    Increase activity slowly   Complete by: As directed       Allergies as of 09/09/2021       Reactions   Crestor [rosuvastatin Calcium] Other (See Comments)   myalgia   Ciprofloxacin    Invokana [canagliflozin] Other (See Comments)   dehydrated   Prednisone Other (See Comments)   Sulfamethizole         Medication List     STOP taking these medications    Mitigare 0.6 MG Caps Generic drug: Colchicine       TAKE these medications    Accu-Chek SmartView test strip Generic drug: glucose blood TEST BLOOD SUGAR TWICE DAILY   acetaminophen 500 MG tablet Commonly known as: TYLENOL Take 1,000 mg by mouth every 8 (eight) hours as  needed.   allopurinol 100 MG tablet Commonly known as: ZYLOPRIM TAKE 1 TABLET AT BEDTIME   aspirin EC 81 MG tablet Take 1 tablet (81 mg total) by mouth daily. Swallow whole.   [name] KWIKPEN [place] Inject 36 Units into the skin at bedtime.   carvedilol 12.5 MG  tablet Commonly known as: COREG TAKE 1 TABLET TWICE DAILY   cefadroxil 500 MG capsule Commonly known as: DURICEF Take 2 capsules (1,000 mg total) by mouth 2 (two) times daily for 7 days.   clopidogrel 75 MG tablet Commonly known as: PLAVIX Take 1 tablet (75 mg total) by mouth daily.   Droplet Pen Needles 32G X 4 MM Misc Generic drug: Insulin Pen Needle USE WITH [name] INSULIN INJECTION DAILY AS INSTRUCTED   ezetimibe 10 MG tablet Commonly known as: ZETIA TAKE 1 TABLET EVERY DAY   fluticasone 50 MCG/ACT nasal spray Commonly known as: FLONASE USE 2 SPRAYS IN EACH NOSTRIL ONCE DAILY USE FOR 4 TO 6 WEEKS, THEN STOP AND USE SEASONALLY OR AS NEEDED.   isosorbide mononitrate 30 MG 24 hr tablet Commonly known as: IMDUR Take 0.5 tablets (15 mg total) by mouth daily.   lisinopril 40 MG tablet Commonly known as: ZESTRIL TAKE 1 TABLET EVERY DAY   nitroGLYCERIN 0.4 MG SL tablet Commonly known as: NITROSTAT Place 1 tablet (0.4 mg total) under the tongue every 5 (five) minutes as needed for chest pain.   pantoprazole 20 MG tablet Commonly known as: PROTONIX Take 1 tablet (20 mg total) by mouth 2 (two) times daily before a meal.   Rybelsus 14 MG Tabs Generic drug: Semaglutide Take 1 tablet (14 mg total) by mouth daily before breakfast.        Allergies  Allergen Reactions   Crestor [Rosuvastatin Calcium] Other (See Comments)    myalgia   Ciprofloxacin    Invokana [Canagliflozin] Other (See Comments)    dehydrated   Prednisone Other (See Comments)   Sulfamethizole     Consultations: Cardiology   Procedures/Studies: ECHOCARDIOGRAM COMPLETE  Result Date: 09/07/2021    ECHOCARDIOGRAM REPORT   Patient Name:   [name] Date of Exam: 09/07/2021 Medical Rec #:  [id number]          Height:       71.0 in Accession #:    [id number]         Weight:       230.0 lb Date of Birth:  13-Sep-1950           BSA:          2.238 m Patient Age:    89 years           BP:            177/90 mmHg Patient Gender: M                  HR:           74 bpm. Exam Location:  [facility] Procedure: 2D Echo, Cardiac Doppler, Color Doppler and Intracardiac            Opacification Agent Indications:     NSTEMI I21.4  History:         Patient has prior history of Echocardiogram examinations, most                  recent 12/29/2019. CAD; Risk Factors:Hypertension and Diabetes.                  NSTEMI, PE.  Sonographer:     Sherrie Sport Referring Phys:  [id number] SHERI HAMMOCK Diagnosing Phys: Nelva Bush MD  Sonographer Comments: Suboptimal apical window. IMPRESSIONS  1. Left ventricular ejection fraction, by estimation, is 60 to 65%. The left ventricle has normal function. The left ventricle has no regional wall motion abnormalities. There is mild left ventricular hypertrophy. Left ventricular diastolic parameters are consistent with Grade II diastolic dysfunction (pseudonormalization). Elevated left atrial pressure.  2. Right ventricular systolic function is normal. The right ventricular size is mildly enlarged. Tricuspid regurgitation signal is inadequate for assessing PA pressure.  3. Right atrial size was mildly dilated.  4. The mitral valve is normal in structure. No evidence of mitral valve regurgitation. No evidence of mitral stenosis.  5. The aortic valve is tricuspid. Aortic valve regurgitation is not visualized. No aortic stenosis is present.  6. The inferior vena cava is dilated in size with <50% respiratory variability, suggesting right atrial pressure of 15 mmHg. FINDINGS  Left Ventricle: Left ventricular ejection fraction, by estimation, is 60 to 65%. The left ventricle has normal function. The left ventricle has no regional wall motion abnormalities. Definity contrast agent was given IV to delineate the left ventricular  endocardial borders. The left ventricular internal cavity size was normal in size. There is mild left ventricular hypertrophy. Left ventricular diastolic parameters are consistent  with Grade II diastolic dysfunction (pseudonormalization). Elevated left atrial pressure. Right Ventricle: The right ventricular size is mildly enlarged. No increase in right ventricular wall thickness. Right ventricular systolic function is normal. Tricuspid regurgitation signal is inadequate for assessing PA pressure. Left Atrium: Left atrial size was normal in size. Right Atrium: Right atrial size was mildly dilated. Pericardium: Trivial pericardial effusion is present. Mitral Valve: The mitral valve is normal in structure. No evidence of mitral valve regurgitation. No evidence of mitral valve stenosis. MV peak gradient, 7.3 mmHg. The mean mitral valve gradient is 3.0 mmHg. Tricuspid Valve: The tricuspid valve is grossly normal. Tricuspid valve regurgitation is trivial. Aortic Valve: The aortic valve is tricuspid. Aortic valve regurgitation is not visualized. No aortic stenosis is present. Aortic valve mean gradient measures 3.0 mmHg. Aortic valve peak gradient measures 6.1 mmHg. Aortic valve area, by VTI measures 3.16 cm. Pulmonic Valve: The pulmonic valve was grossly normal. Pulmonic valve regurgitation is not visualized. No evidence of pulmonic stenosis. Aorta: The aortic root is normal in size and structure. Pulmonary Artery: The pulmonary artery is of normal size. Venous: The inferior vena cava is dilated in size with less than 50% respiratory variability, suggesting right atrial pressure of 15 mmHg. IAS/Shunts: The interatrial septum was not well visualized.  LEFT VENTRICLE PLAX 2D LVIDd:         4.50 cm   Diastology LVIDs:         2.90 cm   LV e' medial:    5.44 cm/s LV PW:         1.30 cm   LV E/e' medial:  24.1 LV IVS:        1.07 cm   LV e' lateral:   5.87 cm/s LVOT diam:     2.00 cm   LV E/e' lateral: 22.3 LV SV:         66 LV SV Index:   29 LVOT Area:     3.14 cm  RIGHT VENTRICLE RV Basal diam:  4.30 cm RV S prime:     14.90 cm/s TAPSE (M-mode): 2.8 cm LEFT ATRIUM  Index        RIGHT  ATRIUM           Index LA diam:        4.00 cm 1.79 cm/m   RA Area:     25.70 cm LA Vol (A2C):   41.7 ml 18.64 ml/m  RA Volume:   89.80 ml  40.13 ml/m LA Vol (A4C):   48.5 ml 21.68 ml/m LA Biplane Vol: 48.0 ml 21.45 ml/m  AORTIC VALVE AV Area (Vmax):    2.45 cm AV Area (Vmean):   2.78 cm AV Area (VTI):     3.16 cm AV Vmax:           123.00 cm/s AV Vmean:          78.700 cm/s AV VTI:            0.208 m AV Peak Grad:      6.1 mmHg AV Mean Grad:      3.0 mmHg LVOT Vmax:         96.00 cm/s LVOT Vmean:        69.600 cm/s LVOT VTI:          0.209 m LVOT/AV VTI ratio: 1.00  AORTA Ao Root diam: 3.00 cm MITRAL VALVE MV Area (PHT): 3.53 cm     SHUNTS MV Area VTI:   1.94 cm     Systemic VTI:  0.21 m MV Peak grad:  7.3 mmHg     Systemic Diam: 2.00 cm MV Mean grad:  3.0 mmHg MV Vmax:       1.35 m/s MV Vmean:      83.2 cm/s MV Decel Time: 215 msec MV E velocity: 131.00 cm/s MV A velocity: 119.00 cm/s MV E/A ratio:  1.10 Nelva Bush MD Electronically signed by Nelva Bush MD Signature Date/Time: 09/07/2021/12:57:47 PM    Final    US RENAL  Result Date: 09/05/2021 CLINICAL DATA:  Acute kidney injury. EXAM: RENAL / URINARY TRACT ULTRASOUND COMPLETE COMPARISON:  July 22, 2015 FINDINGS: Right Kidney: Renal measurements: 13.0 cm x 8.4 cm x 6.9 cm = volume: 394.8 mL. Diffusely increased echogenicity of the renal parenchyma is noted. A 2.4 cm x 2.6 cm x 2.2 cm cystic appearing area is seen within the lateral aspect of the mid right kidney. No hydronephrosis is visualized. Left Kidney: Renal measurements: 9.9 cm x 6.3 cm x 5.8 cm = volume: 188.6 mL. Diffusely increased echogenicity of the renal parenchyma is noted. 4.0 cm x 2.8 cm x 2.3 cm and 3.0 cm x 1.7 cm x 2.8 cm cysts are seen within the left kidney. No hydronephrosis is visualized. Bladder: Appears normal for degree of bladder distention. Other: None. IMPRESSION: 1. Bilateral simple renal cysts. 2. Echogenic kidneys likely secondary to medical renal disease.  Electronically Signed   By: Virgina Norfolk M.D.   On: 09/05/2021 23:00   DG Chest Port 1 View  Result Date: 09/05/2021 CLINICAL DATA:  Chest pain, shortness of breath, chills EXAM: PORTABLE CHEST 1 VIEW COMPARISON:  05/02/2021 FINDINGS: Heart and mediastinal contours are within normal limits. No focal opacities or effusions. No acute bony abnormality. IMPRESSION: No active disease. Electronically Signed   By: Rolm Baptise M.D.   On: 09/05/2021 21:24      Subjective: Seen and examined at time of discharge.  Stable no distress.  Stable for discharge home.  Discharge Exam: Vitals:   09/09/21 0348 09/09/21 0715  BP: (!) 183/88 (!) 149/69  Pulse: 65 62  Resp: 15 17  Temp: 98.6 F (37 C) 98.2 F (36.8 C)  SpO2: 94% 99%   Vitals:   09/08/21 1636 09/08/21 1943 09/09/21 0348 09/09/21 0715  BP: (!) 160/70 (!) 172/82 (!) 183/88 (!) 149/69  Pulse: 65 63 65 62  Resp: 18 18 15 17   Temp: 97.8 F (36.6 C) 98.5 F (36.9 C) 98.6 F (37 C) 98.2 F (36.8 C)  TempSrc: Oral Oral Oral Oral  SpO2: 100% 98% 94% 99%  Weight:      Height:        General: Pt is alert, awake, not in acute distress Cardiovascular: RRR, S1/S2 +, no rubs, no gallops Respiratory: CTA bilaterally, no wheezing, no rhonchi Abdominal: Soft, NT, ND, bowel sounds + Extremities: no edema, no cyanosis    The results of significant diagnostics from this hospitalization (including imaging, microbiology, ancillary and laboratory) are listed below for reference.     Microbiology: Recent Results (from the past 240 hour(s))  Urine Culture     Status: Abnormal   Collection Time: 09/05/21  8:52 PM   Specimen: In/Out Cath Urine  Result Value Ref Range Status   Specimen Description   Final    IN/OUT CATH URINE Performed at [facility], [address] [place], [place] [address]    Special Requests   Final    NONE Performed at [facility], [address] [place], [place] [address]    Culture  >=100,000 COLONIES/mL ESCHERICHIA COLI (A)  Final   Report Status 09/08/2021 FINAL  Final   Organism ID, Bacteria ESCHERICHIA COLI (A)  Final      Susceptibility   Escherichia coli - MIC*    AMPICILLIN >=32 RESISTANT Resistant     CEFAZOLIN <=4 SENSITIVE Sensitive     CEFEPIME <=0.12 SENSITIVE Sensitive     CEFTRIAXONE <=0.25 SENSITIVE Sensitive     CIPROFLOXACIN <=0.25 SENSITIVE Sensitive     GENTAMICIN <=1 SENSITIVE Sensitive     IMIPENEM <=0.25 SENSITIVE Sensitive     NITROFURANTOIN <=16 SENSITIVE Sensitive     TRIMETH/SULFA >=320 RESISTANT Resistant     AMPICILLIN/SULBACTAM >=32 RESISTANT Resistant     PIP/TAZO <=4 SENSITIVE Sensitive     * >=100,000 COLONIES/mL ESCHERICHIA COLI  Blood Culture (routine x 2)     Status: Abnormal   Collection Time: 09/05/21  8:59 PM   Specimen: BLOOD  Result Value Ref Range Status   Specimen Description   Final    BLOOD RAC Performed at [facility], [address], [place], [place] [address]    Special Requests   Final    BOTTLES DRAWN AEROBIC AND ANAEROBIC BCAV Performed at [facility], [address] [place], [place] [address]    Culture  Setup Time   Final    GRAM NEGATIVE RODS AEROBIC BOTTLE ONLY CRITICAL VALUE NOTED.  VALUE IS CONSISTENT WITH PREVIOUSLY REPORTED AND CALLED VALUE. Performed at [facility], [address] [place], [place] [address]    Culture (A)  Final    ESCHERICHIA COLI SUSCEPTIBILITIES PERFORMED ON PREVIOUS CULTURE WITHIN THE LAST 5 DAYS. Performed at [facility], [place] [address], [place], [place] [address]    Report Status 09/08/2021 FINAL  Final  Blood Culture (routine x 2)     Status: Abnormal   Collection Time: 09/05/21  9:04 PM   Specimen: BLOOD  Result Value Ref Range Status   Specimen Description   Final    BLOOD LAC Performed at [facility], [address] [place], [place] [address]    Special Requests   Final    BOTTLES DRAWN AEROBIC AND ANAEROBIC  BCAV Performed at [facility], [address] [place], [place] [address]    Culture  Setup Time   Final    GRAM NEGATIVE RODS IN BOTH AEROBIC AND ANAEROBIC BOTTLES Organism ID to follow CRITICAL RESULT CALLED TO, READ BACK BY AND VERIFIED WITH: [facility] MITCHELL 09/06/21 1225 MU Performed at [facility], [address] [place], [place] [address]    Culture ESCHERICHIA COLI (A)  Final   Report Status 09/08/2021 FINAL  Final   Organism ID, Bacteria ESCHERICHIA COLI  Final      Susceptibility   Escherichia coli - MIC*    AMPICILLIN >=32 RESISTANT Resistant     CEFAZOLIN <=4 SENSITIVE Sensitive     CEFEPIME <=0.12 SENSITIVE Sensitive     CEFTAZIDIME <=1 SENSITIVE Sensitive     CEFTRIAXONE <=0.25 SENSITIVE Sensitive     CIPROFLOXACIN <=0.25 SENSITIVE Sensitive     GENTAMICIN <=1 SENSITIVE Sensitive     IMIPENEM <=0.25 SENSITIVE Sensitive     TRIMETH/SULFA >=320 RESISTANT Resistant     AMPICILLIN/SULBACTAM >=32 RESISTANT Resistant     PIP/TAZO <=4 SENSITIVE Sensitive     * ESCHERICHIA COLI  Blood Culture ID Panel (Reflexed)     Status: Abnormal   Collection Time: 09/05/21  9:04 PM  Result Value Ref Range Status   Enterococcus faecalis NOT DETECTED NOT DETECTED Final   Enterococcus Faecium NOT DETECTED NOT DETECTED Final   Listeria monocytogenes NOT DETECTED NOT DETECTED Final   Staphylococcus species NOT DETECTED NOT DETECTED Final   Staphylococcus aureus (BCID) NOT DETECTED NOT DETECTED Final   Staphylococcus epidermidis NOT DETECTED NOT DETECTED Final   Staphylococcus lugdunensis NOT DETECTED NOT DETECTED Final   Streptococcus species NOT DETECTED NOT DETECTED Final   Streptococcus agalactiae NOT DETECTED NOT DETECTED Final   Streptococcus pneumoniae NOT DETECTED NOT DETECTED Final   Streptococcus pyogenes NOT DETECTED NOT DETECTED Final   A.calcoaceticus-baumannii NOT DETECTED NOT DETECTED Final   Bacteroides fragilis NOT DETECTED NOT DETECTED Final    Enterobacterales DETECTED (A) NOT DETECTED Final    Comment: Enterobacterales represent a large order of gram negative bacteria, not a single organism. CRITICAL RESULT CALLED TO, READ BACK BY AND VERIFIED WITH: DEVON MITCHELL 09/06/21 1225 MU    Enterobacter cloacae complex NOT DETECTED NOT DETECTED Final   Escherichia coli DETECTED (A) NOT DETECTED Final    Comment: CRITICAL RESULT CALLED TO, READ BACK BY AND VERIFIED WITH: DEVAN MITCHELL 09/06/21 1225 MU    Klebsiella aerogenes NOT DETECTED NOT DETECTED Final   Klebsiella oxytoca NOT DETECTED NOT DETECTED Final   Klebsiella pneumoniae NOT DETECTED NOT DETECTED Final   Proteus species NOT DETECTED NOT DETECTED Final   Salmonella species NOT DETECTED NOT DETECTED Final   Serratia marcescens NOT DETECTED NOT DETECTED Final   Haemophilus influenzae NOT DETECTED NOT DETECTED Final   Neisseria meningitidis NOT DETECTED NOT DETECTED Final   Pseudomonas aeruginosa NOT DETECTED NOT DETECTED Final   Stenotrophomonas maltophilia NOT DETECTED NOT DETECTED Final   Candida albicans NOT DETECTED NOT DETECTED Final   Candida auris NOT DETECTED NOT DETECTED Final   Candida glabrata NOT DETECTED NOT DETECTED Final   Candida krusei NOT DETECTED NOT DETECTED Final   Candida parapsilosis NOT DETECTED NOT DETECTED Final   Candida tropicalis NOT DETECTED NOT DETECTED Final   Cryptococcus neoformans/gattii NOT DETECTED NOT DETECTED Final  CTX-M ESBL NOT DETECTED NOT DETECTED Final   Carbapenem resistance IMP NOT DETECTED NOT DETECTED Final   Carbapenem resistance KPC NOT DETECTED NOT DETECTED Final   Carbapenem resistance NDM NOT DETECTED NOT DETECTED Final   Carbapenem resist OXA 48 LIKE NOT DETECTED NOT DETECTED Final   Carbapenem resistance VIM NOT DETECTED NOT DETECTED Final    Comment: Performed at [facility], [address] [place], [place] [address]  Resp Panel by RT-PCR (Flu A&B, Covid) Anterior Nasal Swab     Status: None    Collection Time: 09/05/21  9:17 PM   Specimen: Anterior Nasal Swab  Result Value Ref Range Status   SARS Coronavirus 2 by RT PCR NEGATIVE NEGATIVE Final    Comment: (NOTE) SARS-CoV-2 target nucleic acids are NOT DETECTED.  The SARS-CoV-2 RNA is generally detectable in upper respiratory specimens during the acute phase of infection. The lowest concentration of SARS-CoV-2 viral copies this assay can detect is 138 copies/mL. A negative result does not preclude SARS-Cov-2 infection and should not be used as the sole basis for treatment or other patient management decisions. A negative result may occur with  improper specimen collection/handling, submission of specimen other than nasopharyngeal swab, presence of viral mutation(s) within the areas targeted by this assay, and inadequate number of viral copies(<138 copies/mL). A negative result must be combined with clinical observations, patient history, and epidemiological information. The expected result is Negative.  Fact Sheet for Patients:  BloggerCourse.com  Fact Sheet for Healthcare Providers:  SeriousBroker.it  This test is no t yet approved or cleared by the [place] FDA and  has been authorized for detection and/or diagnosis of SARS-CoV-2 by FDA under an Emergency Use Authorization (EUA). This EUA will remain  in effect (meaning this test can be used) for the duration of the COVID-19 declaration under Section 564(b)(1) of the Act, 21 U.S.C.section 360bbb-3(b)(1), unless the authorization is terminated  or revoked sooner.       Influenza A by PCR NEGATIVE NEGATIVE Final   Influenza B by PCR NEGATIVE NEGATIVE Final    Comment: (NOTE) The Xpert Xpress SARS-CoV-2/FLU/RSV plus assay is intended as an aid in the diagnosis of influenza from Nasopharyngeal swab specimens and should not be used as a sole basis for treatment. Nasal washings and aspirates are unacceptable for  Xpert Xpress SARS-CoV-2/FLU/RSV testing.  Fact Sheet for Patients: BloggerCourse.com  Fact Sheet for Healthcare Providers: SeriousBroker.it  This test is not yet approved or cleared by the [place] FDA and has been authorized for detection and/or diagnosis of SARS-CoV-2 by FDA under an Emergency Use Authorization (EUA). This EUA will remain in effect (meaning this test can be used) for the duration of the COVID-19 declaration under Section 564(b)(1) of the Act, 21 U.S.C. section 360bbb-3(b)(1), unless the authorization is terminated or revoked.  Performed at [facility], [address] [place], [place] [address]      Labs: BNP (last 3 results) Recent Labs    09/05/21 2045  BNP 71.6   Basic Metabolic Panel: Recent Labs  Lab 09/05/21 2045 09/07/21 0410 09/08/21 0502 09/09/21 0414  NA 142 138 140 141  K 5.0 3.5 3.4* 3.8  CL 106 109 106 106  CO2 23 23 27 30   GLUCOSE 167* 149* 121* 115*  BUN 19 20 17 16   CREATININE 1.81* 1.48* 1.28* 1.22  CALCIUM 9.3 8.4* 8.8* 9.3  MG  --  2.2 2.1 2.2   Liver Function Tests: Recent Labs  Lab 09/05/21 2045  AST 31  ALT 20  ALKPHOS 70  BILITOT 1.3*  PROT 8.3*  ALBUMIN 4.1   No results for input(s): "LIPASE", "AMYLASE" in the last 168 hours. No results for input(s): "AMMONIA" in the last 168 hours. CBC: Recent Labs  Lab 09/05/21 2045 09/07/21 0410 09/08/21 0502 09/09/21 0414  WBC 5.2 4.3 4.1 5.5  HGB 13.2 10.4* 10.4* 11.6*  HCT 42.5 32.0* 32.6* 36.3*  MCV 91.8 88.2 88.1 87.5  PLT 258 170 217 238   Cardiac Enzymes: No results for input(s): "CKTOTAL", "CKMB", "CKMBINDEX", "TROPONINI" in the last 168 hours. BNP: Invalid input(s): "POCBNP" CBG: Recent Labs  Lab 09/08/21 0750 09/08/21 1132 09/08/21 1637 09/08/21 2117 09/09/21 0714  GLUCAP 121* 174* 218* 175* 122*   D-Dimer No results for input(s): "DDIMER" in the last 72 hours. Hgb A1c No  results for input(s): "HGBA1C" in the last 72 hours. Lipid Profile No results for input(s): "CHOL", "HDL", "LDLCALC", "TRIG", "CHOLHDL", "LDLDIRECT" in the last 72 hours. Thyroid function studies No results for input(s): "TSH", "T4TOTAL", "T3FREE", "THYROIDAB" in the last 72 hours.  Invalid input(s): "FREET3" Anemia work up No results for input(s): "VITAMINB12", "FOLATE", "FERRITIN", "TIBC", "IRON", "RETICCTPCT" in the last 72 hours. Urinalysis    Component Value Date/Time   COLORURINE YELLOW (A) 09/05/2021 2052   APPEARANCEUR HAZY (A) 09/05/2021 2052   APPEARANCEUR Cloudy (A) 12/10/2019 0926   LABSPEC 1.014 09/05/2021 2052   PHURINE 6.0 09/05/2021 2052   GLUCOSEU 150 (A) 09/05/2021 2052   HGBUR SMALL (A) 09/05/2021 2052   BILIRUBINUR NEGATIVE 09/05/2021 2052   BILIRUBINUR Negative 12/10/2019 0926   KETONESUR NEGATIVE 09/05/2021 2052   PROTEINUR >=300 (A) 09/05/2021 2052   UROBILINOGEN 0.2 06/27/2019 1150   NITRITE NEGATIVE 09/05/2021 2052   LEUKOCYTESUR SMALL (A) 09/05/2021 2052   Sepsis Labs Recent Labs  Lab 09/05/21 2045 09/07/21 0410 09/08/21 0502 09/09/21 0414  WBC 5.2 4.3 4.1 5.5   Microbiology Recent Results (from the past 240 hour(s))  Urine Culture     Status: Abnormal   Collection Time: 09/05/21  8:52 PM   Specimen: In/Out Cath Urine  Result Value Ref Range Status   Specimen Description   Final    IN/OUT CATH URINE Performed at [facility], [address] [place], [place] [address]    Special Requests   Final    NONE Performed at [facility], [address] [place], [place] [address]    Culture >=100,000 COLONIES/mL ESCHERICHIA COLI (A)  Final   Report Status 09/08/2021 FINAL  Final   Organism ID, Bacteria ESCHERICHIA COLI (A)  Final      Susceptibility   Escherichia coli - MIC*    AMPICILLIN >=32 RESISTANT Resistant     CEFAZOLIN <=4 SENSITIVE Sensitive     CEFEPIME <=0.12 SENSITIVE Sensitive     CEFTRIAXONE <=0.25 SENSITIVE  Sensitive     CIPROFLOXACIN <=0.25 SENSITIVE Sensitive     GENTAMICIN <=1 SENSITIVE Sensitive     IMIPENEM <=0.25 SENSITIVE Sensitive     NITROFURANTOIN <=16 SENSITIVE Sensitive     TRIMETH/SULFA >=320 RESISTANT Resistant     AMPICILLIN/SULBACTAM >=32 RESISTANT Resistant     PIP/TAZO <=4 SENSITIVE Sensitive     * >=100,000 COLONIES/mL ESCHERICHIA COLI  Blood Culture (routine x 2)     Status: Abnormal   Collection Time: 09/05/21  8:59 PM   Specimen: BLOOD  Result Value Ref Range Status   Specimen Description   Final    BLOOD RAC Performed at [facility],  [place], [place] [address]    Special Requests   Final    BOTTLES DRAWN AEROBIC AND ANAEROBIC BCAV Performed at [facility], [address] [place], [place] [address]    Culture  Setup Time   Final    GRAM NEGATIVE RODS AEROBIC BOTTLE ONLY CRITICAL VALUE NOTED.  VALUE IS CONSISTENT WITH PREVIOUSLY REPORTED AND CALLED VALUE. Performed at [facility], [address] [place], [place] [address]    Culture (A)  Final    ESCHERICHIA COLI SUSCEPTIBILITIES PERFORMED ON PREVIOUS CULTURE WITHIN THE LAST 5 DAYS. Performed at [facility], [place] [address], [place], [place] [address]    Report Status 09/08/2021 FINAL  Final  Blood Culture (routine x 2)     Status: Abnormal   Collection Time: 09/05/21  9:04 PM   Specimen: BLOOD  Result Value Ref Range Status   Specimen Description   Final    BLOOD LAC Performed at [facility], [address] [place], [place] [address]    Special Requests   Final    BOTTLES DRAWN AEROBIC AND ANAEROBIC BCAV Performed at [facility], [address] [place], [place] [address]    Culture  Setup Time   Final    GRAM NEGATIVE RODS IN BOTH AEROBIC AND ANAEROBIC BOTTLES Organism ID to follow CRITICAL RESULT CALLED TO, READ BACK BY AND VERIFIED WITH: [facility] MITCHELL 09/06/21 1225 MU Performed at [facility], [address] [place],  [address]    Culture ESCHERICHIA COLI (A)  Final   Report Status 09/08/2021 FINAL  Final   Organism ID, Bacteria ESCHERICHIA COLI  Final      Susceptibility   Escherichia coli - MIC*    AMPICILLIN >=32 RESISTANT Resistant     CEFAZOLIN <=4 SENSITIVE Sensitive     CEFEPIME <=0.12 SENSITIVE Sensitive     CEFTAZIDIME <=1 SENSITIVE Sensitive     CEFTRIAXONE <=0.25 SENSITIVE Sensitive     CIPROFLOXACIN <=0.25 SENSITIVE Sensitive     GENTAMICIN <=1 SENSITIVE Sensitive     IMIPENEM <=0.25 SENSITIVE Sensitive     TRIMETH/SULFA >=320 RESISTANT Resistant     AMPICILLIN/SULBACTAM >=32 RESISTANT Resistant     PIP/TAZO <=4 SENSITIVE Sensitive     * ESCHERICHIA COLI  Blood Culture ID Panel (Reflexed)     Status: Abnormal   Collection Time: 09/05/21  9:04 PM  Result Value Ref Range Status   Enterococcus faecalis NOT DETECTED NOT DETECTED Final   Enterococcus Faecium NOT DETECTED NOT DETECTED Final   Listeria monocytogenes NOT DETECTED NOT DETECTED Final   Staphylococcus species NOT DETECTED NOT DETECTED Final   Staphylococcus aureus (BCID) NOT DETECTED NOT DETECTED Final   Staphylococcus epidermidis NOT DETECTED NOT DETECTED Final   Staphylococcus lugdunensis NOT DETECTED NOT DETECTED Final   Streptococcus species NOT DETECTED NOT DETECTED Final   Streptococcus agalactiae NOT DETECTED NOT DETECTED Final   Streptococcus pneumoniae NOT DETECTED NOT DETECTED Final   Streptococcus pyogenes NOT DETECTED NOT DETECTED Final   A.calcoaceticus-baumannii NOT DETECTED NOT DETECTED Final   Bacteroides fragilis NOT DETECTED NOT DETECTED Final   Enterobacterales DETECTED (A) NOT DETECTED Final    Comment: Enterobacterales represent a large order of gram negative bacteria, not a single organism. CRITICAL RESULT CALLED TO, READ BACK BY AND VERIFIED WITH: DEVON MITCHELL 09/06/21 1225 MU    Enterobacter cloacae complex NOT DETECTED NOT DETECTED Final   Escherichia coli DETECTED (A) NOT  DETECTED Final  Comment: CRITICAL RESULT CALLED TO, READ BACK BY AND VERIFIED WITH: DEVAN MITCHELL 09/06/21 1225 MU    Klebsiella aerogenes NOT DETECTED NOT DETECTED Final   Klebsiella oxytoca NOT DETECTED NOT DETECTED Final   Klebsiella pneumoniae NOT DETECTED NOT DETECTED Final   Proteus species NOT DETECTED NOT DETECTED Final   Salmonella species NOT DETECTED NOT DETECTED Final   Serratia marcescens NOT DETECTED NOT DETECTED Final   Haemophilus influenzae NOT DETECTED NOT DETECTED Final   Neisseria meningitidis NOT DETECTED NOT DETECTED Final   Pseudomonas aeruginosa NOT DETECTED NOT DETECTED Final   Stenotrophomonas maltophilia NOT DETECTED NOT DETECTED Final   Candida albicans NOT DETECTED NOT DETECTED Final   Candida auris NOT DETECTED NOT DETECTED Final   Candida glabrata NOT DETECTED NOT DETECTED Final   Candida krusei NOT DETECTED NOT DETECTED Final   Candida parapsilosis NOT DETECTED NOT DETECTED Final   Candida tropicalis NOT DETECTED NOT DETECTED Final   Cryptococcus neoformans/gattii NOT DETECTED NOT DETECTED Final   CTX-M ESBL NOT DETECTED NOT DETECTED Final   Carbapenem resistance IMP NOT DETECTED NOT DETECTED Final   Carbapenem resistance KPC NOT DETECTED NOT DETECTED Final   Carbapenem resistance NDM NOT DETECTED NOT DETECTED Final   Carbapenem resist OXA 48 LIKE NOT DETECTED NOT DETECTED Final   Carbapenem resistance VIM NOT DETECTED NOT DETECTED Final    Comment: Performed at [facility], [address] [place], [place] [address]  Resp Panel by RT-PCR (Flu A&B, Covid) Anterior Nasal Swab     Status: None   Collection Time: 09/05/21  9:17 PM   Specimen: Anterior Nasal Swab  Result Value Ref Range Status   SARS Coronavirus 2 by RT PCR NEGATIVE NEGATIVE Final    Comment: (NOTE) SARS-CoV-2 target nucleic acids are NOT DETECTED.  The SARS-CoV-2 RNA is generally detectable in upper respiratory specimens during the acute phase of infection. The  lowest concentration of SARS-CoV-2 viral copies this assay can detect is 138 copies/mL. A negative result does not preclude SARS-Cov-2 infection and should not be used as the sole basis for treatment or other patient management decisions. A negative result may occur with  improper specimen collection/handling, submission of specimen other than nasopharyngeal swab, presence of viral mutation(s) within the areas targeted by this assay, and inadequate number of viral copies(<138 copies/mL). A negative result must be combined with clinical observations, patient history, and epidemiological information. The expected result is Negative.  Fact Sheet for Patients:  BloggerCourse.com  Fact Sheet for Healthcare Providers:  SeriousBroker.it  This test is no t yet approved or cleared by the [place] FDA and  has been authorized for detection and/or diagnosis of SARS-CoV-2 by FDA under an Emergency Use Authorization (EUA). This EUA will remain  in effect (meaning this test can be used) for the duration of the COVID-19 declaration under Section 564(b)(1) of the Act, 21 U.S.C.section 360bbb-3(b)(1), unless the authorization is terminated  or revoked sooner.       Influenza A by PCR NEGATIVE NEGATIVE Final   Influenza B by PCR NEGATIVE NEGATIVE Final    Comment: (NOTE) The Xpert Xpress SARS-CoV-2/FLU/RSV plus assay is intended as an aid in the diagnosis of influenza from Nasopharyngeal swab specimens and should not be used as a sole basis for treatment. Nasal washings and aspirates are unacceptable for Xpert Xpress SARS-CoV-2/FLU/RSV testing.  Fact Sheet for Patients: BloggerCourse.com  Fact Sheet for Healthcare Providers: SeriousBroker.it  This test is not yet approved or cleared by the [place] FDA  and has been authorized for detection and/or diagnosis of SARS-CoV-2 by FDA under  an Emergency Use Authorization (EUA). This EUA will remain in effect (meaning this test can be used) for the duration of the COVID-19 declaration under Section 564(b)(1) of the Act, 21 U.S.C. section 360bbb-3(b)(1), unless the authorization is terminated or revoked.  Performed at [facility], [address], [place], [place] [address]      Time coordinating discharge: Over 30 minutes  SIGNED:   Sidney Ace, MD  [facility] 09/09/2021, 1:29 PM Pager   If 7PM-7AM, please contact night-coverage

## 2021-09-09 NOTE — Telephone Encounter (Signed)
LVM to return call and to make an appointment

## 2021-09-13 ENCOUNTER — Ambulatory Visit (INDEPENDENT_AMBULATORY_CARE_PROVIDER_SITE_OTHER): Payer: Medicare HMO | Admitting: Pharmacist

## 2021-09-13 DIAGNOSIS — Z794 Long term (current) use of insulin: Secondary | ICD-10-CM

## 2021-09-13 DIAGNOSIS — E785 Hyperlipidemia, unspecified: Secondary | ICD-10-CM

## 2021-09-13 DIAGNOSIS — I129 Hypertensive chronic kidney disease with stage 1 through stage 4 chronic kidney disease, or unspecified chronic kidney disease: Secondary | ICD-10-CM

## 2021-09-13 NOTE — Chronic Care Management (AMB) (Signed)
Chronic Care Management CCM Pharmacy Note  09/13/2021 Name:  [name] MRN:  [id number] DOB:  [birth year]   Subjective: [name] is an 71 y.o. year old male who is a primary patient of [name], DO.  The CCM team was consulted for assistance with disease management and care coordination needs.    Engaged with patient by telephone for follow up visit for pharmacy case management and/or care coordination services.   Objective:  Medications Reviewed Today     Reviewed by Manuela Neptune, RPH-CPP (Pharmacist) on 09/13/21 at [phone]  Med List Status: <None>   Medication Order Taking? Sig Documenting Provider Last Dose Status Informant  ACCU-CHEK SMARTVIEW test strip [id number]  TEST BLOOD SUGAR TWICE DAILY Althea Charon Netta Neat, DO  Active Spouse/Significant Other, Pharmacy Records  acetaminophen (TYLENOL) 500 MG tablet [id number] Yes Take 1,000 mg by mouth every 8 (eight) hours as needed.  [provider] Taking Active Spouse/Significant Other, Pharmacy Records  allopurinol (ZYLOPRIM) 100 MG tablet [id number] Yes TAKE 1 TABLET AT BEDTIME [name], DO Taking Active Spouse/Significant Other, Pharmacy Records  aspirin EC 81 MG EC tablet [id number] Yes Take 1 tablet (81 mg total) by mouth daily. Swallow whole. Alford Highland, MD Taking Active Spouse/Significant Other, Pharmacy Records  carvedilol (COREG) 12.5 MG tablet [id number] Yes TAKE 1 TABLET TWICE DAILY Karamalegos, Netta Neat, DO Taking Active Spouse/Significant Other, Pharmacy Records  cefadroxil (DURICEF) 500 MG capsule [id number] Yes Take 2 capsules (1,000 mg total) by mouth 2 (two) times daily for 7 days. Tresa Moore, MD Taking Active   clopidogrel (PLAVIX) 75 MG tablet [id number] Yes Take 1 tablet (75 mg total) by mouth daily. Debbe Odea, MD Taking Active Spouse/Significant Other, Pharmacy Records  DROPLET PEN NEEDLES 32G X 4 MM MISC [id number]  USE WITH [name]  INSULIN INJECTION DAILY AS INSTRUCTED Althea Charon Netta Neat, DO  Active Spouse/Significant Other, Pharmacy Records  ezetimibe (ZETIA) 10 MG tablet [id number] Yes TAKE 1 TABLET EVERY DAY Karamalegos, Netta Neat, DO Taking Active Spouse/Significant Other, Pharmacy Records  fluticasone (FLONASE) 50 MCG/ACT nasal spray [id number] Yes USE 2 SPRAYS IN EACH NOSTRIL ONCE DAILY USE FOR 4 TO 6 WEEKS, THEN STOP AND USE SEASONALLY OR AS NEEDED. [name], DO Taking Active Spouse/Significant Other, Pharmacy Records  Insulin Glargine Mariella Saa [facility]) [id number] Yes Inject 36 Units into the skin at bedtime. [provider] Taking Active Spouse/Significant Other, Pharmacy Records           Med Note Ronney Asters, [facility] A   Mon Jul 12, 2021  8:59 AM)    isosorbide mononitrate (IMDUR) 30 MG 24 hr tablet [id number] Yes Take 0.5 tablets (15 mg total) by mouth daily. Debbe Odea, MD Taking Active Spouse/Significant Other, Pharmacy Records  lisinopril (ZESTRIL) 40 MG tablet [id number] Yes TAKE 1 TABLET EVERY DAY Karamalegos, Netta Neat, DO Taking Active Spouse/Significant Other, Pharmacy Records  nitroGLYCERIN (NITROSTAT) 0.4 MG SL tablet [id number]  Place 1 tablet (0.4 mg total) under the tongue every 5 (five) minutes as needed for chest pain. Alford Highland, MD  Active Spouse/Significant Other, Pharmacy Records           Med Note [facility], LINDSAY S   Wed Jun 09, 2021  6:42 AM) Last used approx 1-2 months ago but said the pain ended up being from a pulled muscle   pantoprazole (PROTONIX) 20 MG tablet [id number] Yes Take 1 tablet (20 mg total) by mouth 2 (two) times daily before a meal. Althea Charon, Netta Neat,  DO Taking Active Spouse/Significant Other, Pharmacy Records  RYBELSUS 14 MG TABS [id number] Yes Take 1 tablet (14 mg total) by mouth daily before breakfast. [name], DO Taking Active Spouse/Significant Other, Pharmacy Records           Med Note Zettie Cooley Sep 06, 2021 10:04 AM) 30  Day supply last filled 06/14/21            Pertinent Labs:  Lab Results  Component Value Date   HGBA1C 7.9 (H) 08/05/2021   Lab Results  Component Value Date   CHOL 153 08/05/2021   HDL 32 (L) 08/05/2021   LDLCALC 91 08/05/2021   TRIG 198 (H) 08/05/2021   CHOLHDL 4.8 08/05/2021   Lab Results  Component Value Date   CREATININE 1.22 09/09/2021   BUN 16 09/09/2021   NA 141 09/09/2021   K 3.8 09/09/2021   CL 106 09/09/2021   CO2 30 09/09/2021   BP Readings from Last 3 Encounters:  09/09/21 (!) 149/69  08/12/21 136/70  06/09/21 126/78   Pulse Readings from Last 3 Encounters:  09/09/21 62  08/12/21 69  06/09/21 84     SDOH:  (Social Determinants of Health) assessments and interventions performed:    CCM Care Plan  Review of patient past medical history, allergies, medications, health status, including review of consultants reports, laboratory and other test data, was performed as part of comprehensive evaluation and provision of chronic care management services.   Care Plan : PharmD - Medication Management  Updates made by Manuela Neptune, RPH-CPP since 09/13/2021 12:00 AM     Problem: Disease Progression      Long-Range Goal: Disease Progression Prevented or Minimized   Start Date: 04/22/2020  Expected End Date: 07/21/2020  Recent Progress: On track  Priority: High  Note:   Current Barriers:  Financial Barriers - patient has [employer] and reports copay for Bydureon & insulin glargine is cost prohibitive at this time Patient APPROVED for patient assistance for [name] from [employer] through through 03/27/2022 Patient APPROVED to receive Rybelsus from [employer] patient assistance program through 03/27/22  Pharmacist Clinical Goal(s):  Over the next 90 days, patient will achieve adherence to monitoring guidelines and medication adherence to achieve therapeutic efficacy through collaboration with  PharmD and provider.   Interventions: 1:1 collaboration with [name], DO regarding development and update of comprehensive plan of care as evidenced by provider attestation and co-signature Inter-disciplinary care team collaboration (see longitudinal plan of care) Perform chart review. Patient admitted to [facility] 6/11-6/15/2023 related to sepsis/e. coli bacteremia New?Medications Started at Hospital Discharge: - cefadroxil 500 MG capsule - 2 capsules (1,000 mg) by mouth 2 (two) times daily for 7 days Medications Discontinued at Hospital Discharge: -Mitigare (colchicine) Discharging provider advised patient to follow up with PCP in 1-2 weeks and follow-up outpatient cardiology as directed Medication Reconciliation was completed by comparing discharge summary, patient's EMR and Pharmacy list, and upon discussion with patient. Today patient reports taking cefadroxil as directed. Counsel patient on importance of completing antibiotic course.  Encourage patient to schedule follow up provider appointment as directed. States he/his wife will call today to schedule follow up with both PCP and Cardiology  Type 2 Diabetes: Control recently improved based on A1C of A1C 7.9% on 5/11; current treatment: Rybelsus 14 mg daily Confirms taking ?30 minutes before the first food, beverage, or other oral medications of the day with ?4 oz of plain water  Insulin glargine ([name]/Lantus) 36 units QHS Reports following previous instruction for adjustment: if blood sugar blood sugar consistently <130, may decrease [name] dose by 2 units every 7 days Previous therapies tried: Trulicity (GI intolerance) Metformin (unable to tolerate - GI side effects) Bydureon (difficulty with device/administration) Invokana (felt dehydrated) Reports recent home blood sugar readings:  Morning fasting    13 - June - Patient admitted to [facility]  14 - June -   15 - June -   16 - June 144  *Restarted Rybelsus  17 - June 149   18 - June 119   19 - June 119   Attributes improvement in blood sugar control to having smaller meals spread throughout the day to avoid having larger portion sizes at meal times Encourage patient to continue positive dietary changes and to have well-balanced meals while controlling carbohydrate portion sizes and limiting nighttime snacking Have counseled patient on s/s of hypoglycemia and how to manage lows Encouraged patient to obtain and carry glucose tablets Exercise: stays active throughout the day working Patient to continue to monitor home blood sugar, keep log of results and have this record to review during appointments, but to contact office or CM Pharmacist sooner for readings outside of established parameters or for symptoms Have discussed benefits of continuous blood glucose monitor and offered to investigate health plan coverage for CBGM, but patient declined   Hyperlipidemia: Controlled; current treatment: Ezetimibe 10 mg daily - reports currently taking every other day Previous therapies tried: atorvastatin 40 mg daily & 10 mg daily (unable to tolerate due to myalgia) Have discussed importance of limiting saturated fats and trans fats in diet   Hypertension: Current treatment: Carvedilol 12.5 mg twice daily Lisinopril 40 mg daily  Imdur 15 mg (1/2 tablet) daily Reports blood pressure this morning: 135/75, HR 69  Have counseled patient on blood pressure monitoring technique, including avoiding nicotine use for at least 30 minutes prior to taking reading Patient to monitor home blood pressure, keep log of results, have to review at upcoming appointments, but to contact office sooner for readings outside of established parameters    Patient Goals/Self-Care Activities Over the next 90 days, patient will:  - take medications as directed  Patient uses weekly pillbox to manage medications - check glucose, document, and provide at future  appointments - check blood pressure, document, and provide at future appointments - attend medical appointments as scheduled  Follow Up Plan: Telephone follow up appointment with care management team member scheduled for: 7/19 at 8:30 am       Estelle Grumbles, PharmD, [employer], CPP Clinical Pharmacist [facility] [phone]

## 2021-09-13 NOTE — Patient Instructions (Signed)
Visit Information  Thank you for taking time to visit with me today. Please don't hesitate to contact me if I can be of assistance to you before our next scheduled telephone appointment.  Following are the goals we discussed today:   Goals Addressed             This Visit's Progress    Pharmacy - Patient Goals       Our goal A1c is less than 7%. This corresponds with fasting sugars less than 130 and 2 hour after meal sugars less than 180. Please check your fasting blood sugar each morning and keep a record of the results  Please check your blood pressure and keep record of the results  Feel free to call me with any questions or concerns. I look forward to our next call  Estelle Grumbles, PharmD, BCACP Clinical Pharmacist [facility] [phone]           Our next appointment is by telephone on 7/19 at 8:30 am  Please call the care guide team at [phone] if you need to cancel or reschedule your appointment.    Patient verbalizes understanding of instructions and care plan provided today and agrees to view in MyChart. Active MyChart status and patient understanding of how to access instructions and care plan via MyChart confirmed with patient.

## 2021-09-16 ENCOUNTER — Encounter: Payer: Self-pay | Admitting: Family Medicine

## 2021-09-16 ENCOUNTER — Ambulatory Visit (INDEPENDENT_AMBULATORY_CARE_PROVIDER_SITE_OTHER): Payer: Medicare HMO | Admitting: Family Medicine

## 2021-09-16 VITALS — BP 134/74 | HR 73 | Ht 71.0 in | Wt 229.0 lb

## 2021-09-16 DIAGNOSIS — N3000 Acute cystitis without hematuria: Secondary | ICD-10-CM

## 2021-09-16 DIAGNOSIS — B9620 Unspecified Escherichia coli [E. coli] as the cause of diseases classified elsewhere: Secondary | ICD-10-CM | POA: Diagnosis not present

## 2021-09-16 DIAGNOSIS — I252 Old myocardial infarction: Secondary | ICD-10-CM | POA: Diagnosis not present

## 2021-09-16 DIAGNOSIS — R7881 Bacteremia: Secondary | ICD-10-CM

## 2021-09-16 MED ORDER — CEFADROXIL 500 MG PO CAPS: 1000.0000 mg | ORAL_CAPSULE | Freq: Two times a day (BID) | ORAL | 0 refills | Status: AC

## 2021-09-16 NOTE — Patient Instructions (Addendum)
Thank you for coming to the office today.  Pneumonia Vaccine is due - Prevnar-20 anytime, at pharmacy or can get here as well without doctors apt.  Kidney function improved  Finished antibiotic today, please consider 3 more days will order to pharmacy if any symptoms return or persist  Elevated heart enzymes Troponin, can follow up with Cardiology on 7/11, and they can consider cardiac cath or other intervention if needed. These improved nearly back to normal  Please schedule a Follow-up Appointment to: Return if symptoms worsen or fail to improve.  If you have any other questions or concerns, please feel free to call the office or send a message through MyChart. You may also schedule an earlier appointment if necessary.  Additionally, you may be receiving a survey about your experience at our office within a few days to 1 week by e-mail or mail. We value your feedback.  Saralyn Pilar, DO [facility], [place]

## 2021-09-16 NOTE — Progress Notes (Signed)
Subjective:    Patient ID: [name], male    DOB: 1950-11-16, 71 y.o.   MRN: [id number]  [name] is a 71 y.o. male presenting on 09/16/2021 for Hospitalization Follow-up   HPI  HOSPITAL FOLLOW-UP VISIT  Hospital/Location: [facility] Date of Admission: 09/05/21 Date of Discharge: 09/09/21 Transitions of care telephone call: Not completed. Attempted x 2 on 09/09/21  Reason for Admission: Sepsis UTI / NSTEMI  - Hospital H&P and Discharge Summary have been reviewed - Patient presents today 7 days ago after recent hospitalization. Brief summary of recent course, patient had symptoms of fevers chills chest pain dyspnea, abdominal pain UTI, hospitalized, with urosepsis and UTI treated with IV antibiotics vanc cefe flagyl, then switch to ceftriaxone and discharged on oral cephalosporin.  - Today reports overall has done well after discharge. Symptoms of UTI have resolved. Last dose antibiotics today  Documented NSTEMI during hospitalization with elevated troponin, has improved  AKI with elevated Creatinine has improved on last lab.  He has upcoming Cardiology apt. Considering Cardiac cath / future intervention after bacteremia has resolved.  I have reviewed the discharge medication list, and have reconciled the current and discharge medications today.   Current Outpatient Medications:    ACCU-CHEK SMARTVIEW test strip, TEST BLOOD SUGAR TWICE DAILY, Disp: 200 strip, Rfl: 0   acetaminophen (TYLENOL) 500 MG tablet, Take 1,000 mg by mouth every 8 (eight) hours as needed. , Disp: , Rfl:    allopurinol (ZYLOPRIM) 100 MG tablet, TAKE 1 TABLET AT BEDTIME, Disp: 90 tablet, Rfl: 3   aspirin EC 81 MG EC tablet, Take 1 tablet (81 mg total) by mouth daily. Swallow whole., Disp: 30 tablet, Rfl: 0   carvedilol (COREG) 12.5 MG tablet, TAKE 1 TABLET TWICE DAILY, Disp: 180 tablet, Rfl: 3   clopidogrel (PLAVIX) 75 MG tablet, Take 1 tablet (75 mg total) by mouth daily., Disp: 90 tablet, Rfl:  3   DROPLET PEN NEEDLES 32G X 4 MM MISC, USE WITH [name] INSULIN INJECTION DAILY AS INSTRUCTED, Disp: 100 each, Rfl: 1   ezetimibe (ZETIA) 10 MG tablet, TAKE 1 TABLET EVERY DAY, Disp: 90 tablet, Rfl: 3   fluticasone (FLONASE) 50 MCG/ACT nasal spray, USE 2 SPRAYS IN EACH NOSTRIL ONCE DAILY USE FOR 4 TO 6 WEEKS, THEN STOP AND USE SEASONALLY OR AS NEEDED., Disp: 48 g, Rfl: 0   Insulin Glargine ([name] KWIKPEN [place]), Inject 36 Units into the skin at bedtime., Disp: , Rfl:    isosorbide mononitrate (IMDUR) 30 MG 24 hr tablet, Take 0.5 tablets (15 mg total) by mouth daily., Disp: 45 tablet, Rfl: 3   lisinopril (ZESTRIL) 40 MG tablet, TAKE 1 TABLET EVERY DAY, Disp: 90 tablet, Rfl: 3   nitroGLYCERIN (NITROSTAT) 0.4 MG SL tablet, Place 1 tablet (0.4 mg total) under the tongue every 5 (five) minutes as needed for chest pain., Disp: 30 tablet, Rfl: 0   pantoprazole (PROTONIX) 20 MG tablet, Take 1 tablet (20 mg total) by mouth 2 (two) times daily before a meal., Disp: 90 tablet, Rfl: 3   RYBELSUS 14 MG TABS, Take 1 tablet (14 mg total) by mouth daily before breakfast., Disp: 30 tablet, Rfl: 0   cefadroxil (DURICEF) 500 MG capsule, Take 2 capsules (1,000 mg total) by mouth 2 (two) times daily for 3 days., Disp: 12 capsule, Rfl: 0  ------------------------------------------------------------------------- Social History   Tobacco Use   Smoking status: Former    Types: Cigars    Quit date: 2000    Years  since quitting: 23.4   Smokeless tobacco: Current    Types: Chew   Tobacco comments:    wife states patient never smoked cigarettes, just chewing tobacco  Vaping Use   Vaping Use: Never used  Substance Use Topics   Alcohol use: Not Currently    Alcohol/week: 0.0 standard drinks of alcohol   Drug use: No    Review of Systems Per HPI unless specifically indicated above     Objective:    BP 134/74   Pulse 73   Ht 5\' 11"  (1.803 m)   Wt 229 lb (103.9 kg)   SpO2 99%   BMI 31.94 kg/m   Wt  Readings from Last 3 Encounters:  09/16/21 229 lb (103.9 kg)  09/05/21 230 lb (104.3 kg)  08/12/21 230 lb 12.8 oz (104.7 kg)    Physical Exam Vitals and nursing note reviewed.  Constitutional:      General: He is not in acute distress.    Appearance: He is well-developed. He is not diaphoretic.     Comments: Well-appearing, comfortable, cooperative  HENT:     Head: Normocephalic and atraumatic.  Eyes:     General:        Right eye: No discharge.        Left eye: No discharge.     Conjunctiva/sclera: Conjunctivae normal.  Neck:     Thyroid: No thyromegaly.  Cardiovascular:     Rate and Rhythm: Normal rate and regular rhythm.     Pulses: Normal pulses.     Heart sounds: Normal heart sounds. No murmur heard. Pulmonary:     Effort: Pulmonary effort is normal. No respiratory distress.     Breath sounds: Normal breath sounds. No wheezing or rales.  Musculoskeletal:        General: Normal range of motion.     Cervical back: Normal range of motion and neck supple.  Lymphadenopathy:     Cervical: No cervical adenopathy.  Skin:    General: Skin is warm and dry.     Findings: No erythema or rash.  Neurological:     Mental Status: He is alert and oriented to person, place, and time. Mental status is at baseline.  Psychiatric:        Behavior: Behavior normal.     Comments: Well groomed, good eye contact, normal speech and thoughts        Results for orders placed or performed during the hospital encounter of 09/05/21  Resp Panel by RT-PCR (Flu A&B, Covid) Anterior Nasal Swab   Specimen: Anterior Nasal Swab  Result Value Ref Range   SARS Coronavirus 2 by RT PCR NEGATIVE NEGATIVE   Influenza A by PCR NEGATIVE NEGATIVE   Influenza B by PCR NEGATIVE NEGATIVE  Blood Culture (routine x 2)   Specimen: BLOOD  Result Value Ref Range   Specimen Description      BLOOD RAC Performed at [facility], [address], [place], [place] [address]    Special Requests       BOTTLES DRAWN AEROBIC AND ANAEROBIC BCAV Performed at [facility], [address] [place], [place] [address]    Culture  Setup Time      GRAM NEGATIVE RODS AEROBIC BOTTLE ONLY CRITICAL VALUE NOTED.  VALUE IS CONSISTENT WITH PREVIOUSLY REPORTED AND CALLED VALUE. Performed at [facility], [address] [place], [place] [address]    Culture (A)     ESCHERICHIA COLI SUSCEPTIBILITIES PERFORMED ON PREVIOUS CULTURE WITHIN THE LAST 5  DAYS. Performed at [facility], [address], [place], [place] [address]    Report Status 09/08/2021 FINAL   Blood Culture (routine x 2)   Specimen: BLOOD  Result Value Ref Range   Specimen Description      BLOOD LAC Performed at [facility], [address] [place], [place] [address]    Special Requests      BOTTLES DRAWN AEROBIC AND ANAEROBIC BCAV Performed at [facility], [address] [place], [place] [address]    Culture  Setup Time      GRAM NEGATIVE RODS IN BOTH AEROBIC AND ANAEROBIC BOTTLES Organism ID to follow CRITICAL RESULT CALLED TO, READ BACK BY AND VERIFIED WITH: [facility] MITCHELL 09/06/21 1225 MU Performed at [facility], [address] [place], [place] [address]    Culture ESCHERICHIA COLI (A)    Report Status 09/08/2021 FINAL    Organism ID, Bacteria ESCHERICHIA COLI       Susceptibility   Escherichia coli - MIC*    AMPICILLIN >=32 RESISTANT Resistant     CEFAZOLIN <=4 SENSITIVE Sensitive     CEFEPIME <=0.12 SENSITIVE Sensitive     CEFTAZIDIME <=1 SENSITIVE Sensitive     CEFTRIAXONE <=0.25 SENSITIVE Sensitive     CIPROFLOXACIN <=0.25 SENSITIVE Sensitive     GENTAMICIN <=1 SENSITIVE Sensitive     IMIPENEM <=0.25 SENSITIVE Sensitive     TRIMETH/SULFA >=320 RESISTANT Resistant     AMPICILLIN/SULBACTAM >=32 RESISTANT Resistant     PIP/TAZO <=4 SENSITIVE Sensitive     * ESCHERICHIA COLI  Urine Culture   Specimen: In/Out Cath Urine  Result Value Ref Range   Specimen  Description      IN/OUT CATH URINE Performed at [facility], [address] [place], [place] [address]    Special Requests      NONE Performed at [facility], [address] [place], [place] [address]    Culture >=100,000 COLONIES/mL ESCHERICHIA COLI (A)    Report Status 09/08/2021 FINAL    Organism ID, Bacteria ESCHERICHIA COLI (A)       Susceptibility   Escherichia coli - MIC*    AMPICILLIN >=32 RESISTANT Resistant     CEFAZOLIN <=4 SENSITIVE Sensitive     CEFEPIME <=0.12 SENSITIVE Sensitive     CEFTRIAXONE <=0.25 SENSITIVE Sensitive     CIPROFLOXACIN <=0.25 SENSITIVE Sensitive     GENTAMICIN <=1 SENSITIVE Sensitive     IMIPENEM <=0.25 SENSITIVE Sensitive     NITROFURANTOIN <=16 SENSITIVE Sensitive     TRIMETH/SULFA >=320 RESISTANT Resistant     AMPICILLIN/SULBACTAM >=32 RESISTANT Resistant     PIP/TAZO <=4 SENSITIVE Sensitive     * >=100,000 COLONIES/mL ESCHERICHIA COLI  Blood Culture ID Panel (Reflexed)  Result Value Ref Range   Enterococcus faecalis NOT DETECTED NOT DETECTED   Enterococcus Faecium NOT DETECTED NOT DETECTED   Listeria monocytogenes NOT DETECTED NOT DETECTED   Staphylococcus species NOT DETECTED NOT DETECTED   Staphylococcus aureus (BCID) NOT DETECTED NOT DETECTED   Staphylococcus epidermidis NOT DETECTED NOT DETECTED   Staphylococcus lugdunensis NOT DETECTED NOT DETECTED   Streptococcus species NOT DETECTED NOT DETECTED   Streptococcus agalactiae NOT DETECTED NOT DETECTED   Streptococcus pneumoniae NOT DETECTED NOT DETECTED   Streptococcus pyogenes NOT DETECTED NOT DETECTED   A.calcoaceticus-baumannii NOT DETECTED NOT DETECTED   Bacteroides fragilis NOT DETECTED NOT DETECTED   Enterobacterales DETECTED (A) NOT DETECTED   Enterobacter cloacae complex NOT DETECTED NOT DETECTED   Escherichia coli  DETECTED (A) NOT DETECTED   Klebsiella aerogenes NOT DETECTED NOT DETECTED   Klebsiella oxytoca NOT DETECTED NOT DETECTED   Klebsiella  pneumoniae NOT DETECTED NOT DETECTED   Proteus species NOT DETECTED NOT DETECTED   Salmonella species NOT DETECTED NOT DETECTED   Serratia marcescens NOT DETECTED NOT DETECTED   Haemophilus influenzae NOT DETECTED NOT DETECTED   Neisseria meningitidis NOT DETECTED NOT DETECTED   Pseudomonas aeruginosa NOT DETECTED NOT DETECTED   Stenotrophomonas maltophilia NOT DETECTED NOT DETECTED   Candida albicans NOT DETECTED NOT DETECTED   Candida auris NOT DETECTED NOT DETECTED   Candida glabrata NOT DETECTED NOT DETECTED   Candida krusei NOT DETECTED NOT DETECTED   Candida parapsilosis NOT DETECTED NOT DETECTED   Candida tropicalis NOT DETECTED NOT DETECTED   Cryptococcus neoformans/gattii NOT DETECTED NOT DETECTED   CTX-M ESBL NOT DETECTED NOT DETECTED   Carbapenem resistance IMP NOT DETECTED NOT DETECTED   Carbapenem resistance KPC NOT DETECTED NOT DETECTED   Carbapenem resistance NDM NOT DETECTED NOT DETECTED   Carbapenem resist OXA 48 LIKE NOT DETECTED NOT DETECTED   Carbapenem resistance VIM NOT DETECTED NOT DETECTED  Basic metabolic panel  Result Value Ref Range   Sodium 142 135 - 145 mmol/L   Potassium 5.0 3.5 - 5.1 mmol/L   Chloride 106 98 - 111 mmol/L   CO2 23 22 - 32 mmol/L   Glucose, Bld 167 (H) 70 - 99 mg/dL   BUN 19 8 - 23 mg/dL   Creatinine, Ser 1.81 (H) 0.61 - 1.24 mg/dL   Calcium 9.3 8.9 - 10.3 mg/dL   GFR, Estimated 40 (L) >60 mL/min   Anion gap 13 5 - 15  CBC  Result Value Ref Range   WBC 5.2 4.0 - 10.5 K/uL   RBC 4.63 4.22 - 5.81 MIL/uL   Hemoglobin 13.2 13.0 - 17.0 g/dL   HCT 42.5 39.0 - 52.0 %   MCV 91.8 80.0 - 100.0 fL   MCH 28.5 26.0 - 34.0 pg   MCHC 31.1 30.0 - 36.0 g/dL   RDW 15.2 11.5 - 15.5 %   Platelets 258 150 - 400 K/uL   nRBC 0.0 0.0 - 0.2 %  Protime-INR (order if Patient is taking Coumadin / Warfarin)  Result Value Ref Range   Prothrombin Time 13.8 11.4 - 15.2 seconds   INR 1.1 0.8 - 1.2  Lactic acid, plasma  Result Value Ref Range   Lactic  Acid, Venous 1.4 0.5 - 1.9 mmol/L  Lactic acid, plasma  Result Value Ref Range   Lactic Acid, Venous 5.7 (HH) 0.5 - 1.9 mmol/L  APTT  Result Value Ref Range   aPTT 27 24 - 36 seconds  Urinalysis, Complete w Microscopic Anterior Nasal Swab  Result Value Ref Range   Color, Urine YELLOW (A) YELLOW   APPearance HAZY (A) CLEAR   Specific Gravity, Urine 1.014 1.005 - 1.030   pH 6.0 5.0 - 8.0   Glucose, UA 150 (A) NEGATIVE mg/dL   Hgb urine dipstick SMALL (A) NEGATIVE   Bilirubin Urine NEGATIVE NEGATIVE   Ketones, ur NEGATIVE NEGATIVE mg/dL   Protein, ur >=300 (A) NEGATIVE mg/dL   Nitrite NEGATIVE NEGATIVE   Leukocytes,Ua SMALL (A) NEGATIVE   RBC / HPF 6-10 0 - 5 RBC/hpf   WBC, UA >50 (H) 0 - 5 WBC/hpf   Bacteria, UA MANY (A) NONE SEEN   Squamous Epithelial / LPF 0-5 0 - 5   WBC Clumps PRESENT    Mucus PRESENT  Hepatic function panel  Result Value Ref Range   Total Protein 8.3 (H) 6.5 - 8.1 g/dL   Albumin 4.1 3.5 - 5.0 g/dL   AST 31 15 - 41 U/L   ALT 20 0 - 44 U/L   Alkaline Phosphatase 70 38 - 126 U/L   Total Bilirubin 1.3 (H) 0.3 - 1.2 mg/dL   Bilirubin, Direct 0.3 (H) 0.0 - 0.2 mg/dL   Indirect Bilirubin 1.0 (H) 0.3 - 0.9 mg/dL  Brain natriuretic peptide  Result Value Ref Range   B Natriuretic Peptide 71.6 0.0 - 100.0 pg/mL  Cortisol-am, blood  Result Value Ref Range   Cortisol - AM 14.1 6.7 - 22.6 ug/dL  Procalcitonin  Result Value Ref Range   Procalcitonin 24.87 ng/mL  D-dimer, quantitative  Result Value Ref Range   D-Dimer, Quant 2.61 (H) 0.00 - 0.50 ug/mL-FEU  HIV Antibody (routine testing w rflx)  Result Value Ref Range   HIV Screen 4th Generation wRfx Non Reactive Non Reactive  Heparin level (unfractionated)  Result Value Ref Range   Heparin Unfractionated 0.49 0.30 - 0.70 IU/mL  Heparin level (unfractionated)  Result Value Ref Range   Heparin Unfractionated 0.51 0.30 - 0.70 IU/mL  Glucose, capillary  Result Value Ref Range   Glucose-Capillary 144 (H) 70  - 99 mg/dL  Heparin level (unfractionated)  Result Value Ref Range   Heparin Unfractionated 0.33 0.30 - 0.70 IU/mL  Basic metabolic panel  Result Value Ref Range   Sodium 138 135 - 145 mmol/L   Potassium 3.5 3.5 - 5.1 mmol/L   Chloride 109 98 - 111 mmol/L   CO2 23 22 - 32 mmol/L   Glucose, Bld 149 (H) 70 - 99 mg/dL   BUN 20 8 - 23 mg/dL   Creatinine, Ser 1.48 (H) 0.61 - 1.24 mg/dL   Calcium 8.4 (L) 8.9 - 10.3 mg/dL   GFR, Estimated 51 (L) >60 mL/min   Anion gap 6 5 - 15  CBC  Result Value Ref Range   WBC 4.3 4.0 - 10.5 K/uL   RBC 3.63 (L) 4.22 - 5.81 MIL/uL   Hemoglobin 10.4 (L) 13.0 - 17.0 g/dL   HCT 32.0 (L) 39.0 - 52.0 %   MCV 88.2 80.0 - 100.0 fL   MCH 28.7 26.0 - 34.0 pg   MCHC 32.5 30.0 - 36.0 g/dL   RDW 15.2 11.5 - 15.5 %   Platelets 170 150 - 400 K/uL   nRBC 0.0 0.0 - 0.2 %  Magnesium  Result Value Ref Range   Magnesium 2.2 1.7 - 2.4 mg/dL  Glucose, capillary  Result Value Ref Range   Glucose-Capillary 193 (H) 70 - 99 mg/dL  Glucose, capillary  Result Value Ref Range   Glucose-Capillary 156 (H) 70 - 99 mg/dL  Glucose, capillary  Result Value Ref Range   Glucose-Capillary 138 (H) 70 - 99 mg/dL  Heparin level (unfractionated)  Result Value Ref Range   Heparin Unfractionated 0.36 0.30 - 0.70 IU/mL  Glucose, capillary  Result Value Ref Range   Glucose-Capillary 225 (H) 70 - 99 mg/dL  Glucose, capillary  Result Value Ref Range   Glucose-Capillary 237 (H) 70 - 99 mg/dL  Basic metabolic panel  Result Value Ref Range   Sodium 140 135 - 145 mmol/L   Potassium 3.4 (L) 3.5 - 5.1 mmol/L   Chloride 106 98 - 111 mmol/L   CO2 27 22 - 32 mmol/L   Glucose, Bld 121 (H) 70 - 99 mg/dL   BUN  17 8 - 23 mg/dL   Creatinine, Ser 1.61 (H) 0.61 - 1.24 mg/dL   Calcium 8.8 (L) 8.9 - 10.3 mg/dL   GFR, Estimated >09 >60 mL/min   Anion gap 7 5 - 15  CBC  Result Value Ref Range   WBC 4.1 4.0 - 10.5 K/uL   RBC 3.70 (L) 4.22 - 5.81 MIL/uL   Hemoglobin 10.4 (L) 13.0 - 17.0 g/dL    HCT 45.4 (L) 09.8 - 52.0 %   MCV 88.1 80.0 - 100.0 fL   MCH 28.1 26.0 - 34.0 pg   MCHC 31.9 30.0 - 36.0 g/dL   RDW 11.9 14.7 - 82.9 %   Platelets 217 150 - 400 K/uL   nRBC 0.0 0.0 - 0.2 %  Magnesium  Result Value Ref Range   Magnesium 2.1 1.7 - 2.4 mg/dL  Glucose, capillary  Result Value Ref Range   Glucose-Capillary 119 (H) 70 - 99 mg/dL  Glucose, capillary  Result Value Ref Range   Glucose-Capillary 121 (H) 70 - 99 mg/dL  Glucose, capillary  Result Value Ref Range   Glucose-Capillary 174 (H) 70 - 99 mg/dL  Glucose, capillary  Result Value Ref Range   Glucose-Capillary 218 (H) 70 - 99 mg/dL  Basic metabolic panel  Result Value Ref Range   Sodium 141 135 - 145 mmol/L   Potassium 3.8 3.5 - 5.1 mmol/L   Chloride 106 98 - 111 mmol/L   CO2 30 22 - 32 mmol/L   Glucose, Bld 115 (H) 70 - 99 mg/dL   BUN 16 8 - 23 mg/dL   Creatinine, Ser 5.62 0.61 - 1.24 mg/dL   Calcium 9.3 8.9 - 13.0 mg/dL   GFR, Estimated >86 >57 mL/min   Anion gap 5 5 - 15  CBC  Result Value Ref Range   WBC 5.5 4.0 - 10.5 K/uL   RBC 4.15 (L) 4.22 - 5.81 MIL/uL   Hemoglobin 11.6 (L) 13.0 - 17.0 g/dL   HCT 84.6 (L) 96.2 - 95.2 %   MCV 87.5 80.0 - 100.0 fL   MCH 28.0 26.0 - 34.0 pg   MCHC 32.0 30.0 - 36.0 g/dL   RDW 84.1 32.4 - 40.1 %   Platelets 238 150 - 400 K/uL   nRBC 0.0 0.0 - 0.2 %  Magnesium  Result Value Ref Range   Magnesium 2.2 1.7 - 2.4 mg/dL  Glucose, capillary  Result Value Ref Range   Glucose-Capillary 175 (H) 70 - 99 mg/dL  Glucose, capillary  Result Value Ref Range   Glucose-Capillary 122 (H) 70 - 99 mg/dL  CBG monitoring, ED  Result Value Ref Range   Glucose-Capillary 173 (H) 70 - 99 mg/dL  ECHOCARDIOGRAM COMPLETE  Result Value Ref Range   Weight 3,680 oz   Height 71 in   BP 177/90 mmHg   Ao pk vel 1.23 m/s   AV Area VTI 3.16 cm2   AR max vel 2.45 cm2   AV Mean grad 3.0 mmHg   AV Peak grad 6.1 mmHg   S' Lateral 2.90 cm   AV Area mean vel 2.78 cm2   Area-P 1/2 3.53 cm2    MV VTI 1.94 cm2  Troponin I (High Sensitivity)  Result Value Ref Range   Troponin I (High Sensitivity) 13 <18 ng/L  Troponin I (High Sensitivity)  Result Value Ref Range   Troponin I (High Sensitivity) 284 (HH) <18 ng/L  Troponin I (High Sensitivity)  Result Value Ref Range   Troponin I (High  Sensitivity) 844 (HH) <18 ng/L  Troponin I (High Sensitivity)  Result Value Ref Range   Troponin I (High Sensitivity) 1,203 (HH) <18 ng/L  Troponin I (High Sensitivity)  Result Value Ref Range   Troponin I (High Sensitivity) 1,479 (HH) <18 ng/L  Troponin I (High Sensitivity)  Result Value Ref Range   Troponin I (High Sensitivity) 328 (HH) <18 ng/L  Troponin I (High Sensitivity)  Result Value Ref Range   Troponin I (High Sensitivity) 299 (HH) <18 ng/L      Assessment & Plan:   Problem List Items Addressed This Visit     E coli bacteremia - Primary   Relevant Medications   cefadroxil (DURICEF) 500 MG capsule   Other Visit Diagnoses     Acute cystitis without hematuria       Relevant Medications   cefadroxil (DURICEF) 500 MG capsule   History of non-ST elevation myocardial infarction (NSTEMI)          HFU UTI sepsis - resolved Note he had 3 course antibiotic prior to developing worsening UTI / sepsis Last antibiotic today, will extend course 3 more days as precaution heading into weekend   Kidney function improved on Cr last check.  Elevated heart enzymes Troponin, can follow up with Cardiology on 7/11, and they can consider cardiac cath or other intervention if needed. These improved nearly back to normal  Meds ordered this encounter  Medications   cefadroxil (DURICEF) 500 MG capsule    Sig: Take 2 capsules (1,000 mg total) by mouth 2 (two) times daily for 3 days.    Dispense:  12 capsule    Refill:  0    Follow up plan: Return if symptoms worsen or fail to improve.   Nobie Putnam, [facility] 09/16/2021, 9:35  AM

## 2021-09-24 DIAGNOSIS — I10 Essential (primary) hypertension: Secondary | ICD-10-CM | POA: Diagnosis not present

## 2021-09-24 DIAGNOSIS — E1159 Type 2 diabetes mellitus with other circulatory complications: Secondary | ICD-10-CM | POA: Diagnosis not present

## 2021-09-24 DIAGNOSIS — E785 Hyperlipidemia, unspecified: Secondary | ICD-10-CM | POA: Diagnosis not present

## 2021-09-24 DIAGNOSIS — Z794 Long term (current) use of insulin: Secondary | ICD-10-CM | POA: Diagnosis not present

## 2021-10-05 ENCOUNTER — Ambulatory Visit: Payer: Medicare HMO | Admitting: Physician Assistant

## 2021-10-13 ENCOUNTER — Ambulatory Visit (INDEPENDENT_AMBULATORY_CARE_PROVIDER_SITE_OTHER): Payer: Medicare HMO | Admitting: Pharmacist

## 2021-10-13 DIAGNOSIS — N183 Chronic kidney disease, stage 3 unspecified: Secondary | ICD-10-CM

## 2021-10-13 DIAGNOSIS — I129 Hypertensive chronic kidney disease with stage 1 through stage 4 chronic kidney disease, or unspecified chronic kidney disease: Secondary | ICD-10-CM

## 2021-10-13 DIAGNOSIS — E1169 Type 2 diabetes mellitus with other specified complication: Secondary | ICD-10-CM

## 2021-10-13 NOTE — Chronic Care Management (AMB) (Signed)
Chronic Care Management CCM Pharmacy Note  10/13/2021 Name:  [name] MRN:  [id number] DOB:  June 08, 1950   Subjective: [name] is an 71 y.o. year old male who is a primary patient of [name], DO.  The CCM team was consulted for assistance with disease management and care coordination needs.    Engaged with patient by telephone for follow up visit for pharmacy case management and/or care coordination services.   Objective:  Medications Reviewed Today     Reviewed by Manuela Neptune, RPH-CPP (Pharmacist) on 10/13/21 at [phone]  Med List Status: <None>   Medication Order Taking? Sig Documenting Provider Last Dose Status Informant  ACCU-CHEK SMARTVIEW test strip [id number]  TEST BLOOD SUGAR TWICE DAILY Althea Charon Netta Neat, DO  Active Spouse/Significant Other, Pharmacy Records  acetaminophen (TYLENOL) 500 MG tablet [id number]  Take 1,000 mg by mouth every 8 (eight) hours as needed.  [provider]  Active Spouse/Significant Other, Pharmacy Records  allopurinol (ZYLOPRIM) 100 MG tablet [id number]  TAKE 1 TABLET AT BEDTIME Karamalegos, Netta Neat, DO  Active Spouse/Significant Other, Pharmacy Records  aspirin EC 81 MG EC tablet [id number] Yes Take 1 tablet (81 mg total) by mouth daily. Swallow whole. Alford Highland, MD Taking Active Spouse/Significant Other, Pharmacy Records  carvedilol (COREG) 12.5 MG tablet [id number] Yes TAKE 1 TABLET TWICE DAILY Karamalegos, Netta Neat, DO Taking Active Spouse/Significant Other, Pharmacy Records  clopidogrel (PLAVIX) 75 MG tablet [id number] Yes Take 1 tablet (75 mg total) by mouth daily. Debbe Odea, MD Taking Active Spouse/Significant Other, Pharmacy Records  DROPLET PEN NEEDLES 32G X 4 MM MISC [id number]  USE WITH [name] INSULIN INJECTION DAILY AS INSTRUCTED Althea Charon Netta Neat, DO  Active Spouse/Significant Other, Pharmacy Records  ezetimibe (ZETIA) 10 MG tablet [id number] Yes TAKE 1 TABLET  EVERY DAY Karamalegos, Netta Neat, DO Taking Active Spouse/Significant Other, Pharmacy Records  fluticasone (FLONASE) 50 MCG/ACT nasal spray [id number]  USE 2 SPRAYS IN EACH NOSTRIL ONCE DAILY USE FOR 4 TO 6 WEEKS, THEN STOP AND USE SEASONALLY OR AS NEEDED. [name], DO  Active Spouse/Significant Other, Pharmacy Records  Insulin Glargine Mariella Saa [facility]) [id number] Yes Inject 36 Units into the skin at bedtime. [provider] Taking Active Spouse/Significant Other, Pharmacy Records           Med Note Ronney Asters, [facility]   Mon Jul 12, 2021  8:59 AM)    isosorbide mononitrate (IMDUR) 30 MG 24 hr tablet [id number] Yes Take 0.5 tablets (15 mg total) by mouth daily. Debbe Odea, MD Taking Active Spouse/Significant Other, Pharmacy Records  lisinopril (ZESTRIL) 40 MG tablet [id number] Yes TAKE 1 TABLET EVERY DAY Karamalegos, Netta Neat, DO Taking Active Spouse/Significant Other, Pharmacy Records  nitroGLYCERIN (NITROSTAT) 0.4 MG SL tablet [id number]  Place 1 tablet (0.4 mg total) under the tongue every 5 (five) minutes as needed for chest pain. Alford Highland, MD  Active Spouse/Significant Other, Pharmacy Records           Med Note [facility] [place] [facility]   Wed Jun 09, 2021  6:42 AM) Last used approx 1-2 months ago but said the pain ended up being from a pulled muscle   pantoprazole (PROTONIX) 20 MG tablet [id number]  Take 1 tablet (20 mg total) by mouth 2 (two) times daily before a meal. Althea Charon, Netta Neat, DO  Active Spouse/Significant Other, Pharmacy Records  RYBELSUS 14 MG TABS [id number] Yes Take 1 tablet (14 mg total) by mouth daily before breakfast. [name], DO Taking  Active Spouse/Significant Other, Pharmacy Records           Med Note Zettie Cooley Sep 06, 2021 10:04 AM) 30  Day supply last filled 06/14/21            Pertinent Labs:   Lab Results  Component Value Date   HGBA1C 7.9 (H) 08/05/2021   Lab Results   Component Value Date   CHOL 153 08/05/2021   HDL 32 (L) 08/05/2021   LDLCALC 91 08/05/2021   TRIG 198 (H) 08/05/2021   CHOLHDL 4.8 08/05/2021   Lab Results  Component Value Date   CREATININE 1.22 09/09/2021   BUN 16 09/09/2021   NA 141 09/09/2021   K 3.8 09/09/2021   CL 106 09/09/2021   CO2 30 09/09/2021   BP Readings from Last 3 Encounters:  09/16/21 134/74  09/09/21 (!) 149/69  08/12/21 136/70   Pulse Readings from Last 3 Encounters:  09/16/21 73  09/09/21 62  08/12/21 69     SDOH:  (Social Determinants of Health) assessments and interventions performed:    CCM Care Plan  Review of patient past medical history, allergies, medications, health status, including review of consultants reports, laboratory and other test data, was performed as part of comprehensive evaluation and provision of chronic care management services.   Care Plan : PharmD - Medication Management  Updates made by Manuela Neptune, RPH-CPP since 10/13/2021 12:00 AM     Problem: Disease Progression      Long-Range Goal: Disease Progression Prevented or Minimized   Start Date: 04/22/2020  Expected End Date: 07/21/2020  Recent Progress: On track  Priority: High  Note:   Current Barriers:  Financial Barriers - patient has [employer] and reports copay for Bydureon & insulin glargine is cost prohibitive at this time Patient APPROVED for patient assistance for [name] from [employer] through through 03/27/2022 Patient APPROVED to receive Rybelsus from [employer] patient assistance program through 03/27/22  Pharmacist Clinical Goal(s):  Over the next 90 days, patient will achieve adherence to monitoring guidelines and medication adherence to achieve therapeutic efficacy through collaboration with PharmD and provider.   Interventions: 1:1 collaboration with [name], DO regarding development and update of comprehensive plan of care as evidenced by provider attestation  and co-signature Inter-disciplinary care team collaboration (see longitudinal plan of care) Perform chart review. Patient seen for Office Visit with PCP on 6/22 for hospitalization follow up Today patient reports he is doing well. Denies further urinary tract symptoms Confirms planning to attend upcoming appointment with Cardiology on 7/24  Type 2 Diabetes: Control recently improved based on A1C of A1C 7.9% on 5/11; current treatment: Rybelsus 14 mg daily Confirms taking ?30 minutes before the first food, beverage, or other oral medications of the day with ?4 oz of plain water  Insulin glargine ([name]/Lantus) 36 units QHS Reports following previous instruction for adjustment: if blood sugar blood sugar consistently <130, may decrease [name] dose by 2 units every 7 days Previous therapies tried: Trulicity (GI intolerance) Metformin (unable to tolerate - GI side effects) Bydureon (difficulty with device/administration) Invokana (felt dehydrated) Reports recent home blood sugar readings:  Morning fasting Notes  13 - July 90   14 - July 216 *Large supper night before with large carbohydrate portion sizes  15 - July -   16 - July  173   17 - July 159   18 - July 167   19 - July 131   Attributes lower  blood sugar readings to cut back on after supper eating and having smaller portion sizes for lunch and supper Encourage patient to continue positive dietary changes and to have well-balanced meals throughout the day while controlling carbohydrate portion sizes and limiting nighttime snacking Have counseled patient on s/s of hypoglycemia and how to manage lows Encouraged patient to obtain and carry glucose tablets Exercise: stays active throughout the day working Patient to continue to monitor home blood sugar, keep log of results and have this record to review during appointments, but to contact office or CM Pharmacist sooner for readings outside of established parameters or for  symptoms Have discussed benefits of continuous blood glucose monitor and offered to investigate health plan coverage for CBGM, but patient declined Encouraged patient to use glucometer to check blood sugar 2 hours after meals (breakfast, lunch or supper) a couple of times/week to use as feedback on impact of dietary choices on blood sugar    Hyperlipidemia: Controlled; current treatment: Ezetimibe 10 mg daily - reports currently taking every other day Previous therapies tried: atorvastatin 40 mg daily & 10 mg daily (unable to tolerate due to myalgia) Have discussed importance of limiting saturated fats and trans fats in diet   Hypertension: Current treatment: Carvedilol 12.5 mg twice daily Lisinopril 40 mg daily  Imdur 30 mg -1/2 tablet (15 mg) daily Reports blood pressure this morning: 130/72, HR 62 Counsel patient on limiting salt/sodium in his diet Have counseled patient on blood pressure monitoring technique, including avoiding nicotine use for at least 30 minutes prior to taking reading Patient to monitor home blood pressure, keep log of results, have to review at upcoming appointments, but to contact office sooner for readings outside of established parameters    Patient Goals/Self-Care Activities Over the next 90 days, patient will:  - take medications as directed  Patient uses weekly pillbox to manage medications - check glucose, document, and provide at future appointments - check blood pressure, document, and provide at future appointments - attend medical appointments as scheduled  Next appointment with Cardiology on 7/24 Next appointment with Nephrology on 9/18  Follow Up Plan: Telephone follow up appointment with care management team member scheduled for: 12/29/2021 at 8:30 am       Estelle Grumbles, PharmD, [place], CPP Clinical Pharmacist [facility] [phone]

## 2021-10-13 NOTE — Patient Instructions (Signed)
Visit Information  Thank you for taking time to visit with me today. Please don't hesitate to contact me if I can be of assistance to you before our next scheduled telephone appointment.  Following are the goals we discussed today:   Goals Addressed             This Visit's Progress    Pharmacy - Patient Goals       Our goal A1c is less than 7%. This corresponds with fasting sugars less than 130 and 2 hour after meal sugars less than 180. Please check your fasting blood sugar each morning and keep a record of the results  Please check your blood pressure and keep record of the results  Feel free to call me with any questions or concerns. I look forward to our next call  Estelle Grumbles, PharmD, BCACP Clinical Pharmacist [facility] [phone]           Our next appointment is by telephone on 12/29/2021 at 8:30 am  Please call the care guide team at [phone] if you need to cancel or reschedule your appointment.    Patient verbalizes understanding of instructions and care plan provided today and agrees to view in MyChart. Active MyChart status and patient understanding of how to access instructions and care plan via MyChart confirmed with patient.

## 2021-10-14 NOTE — Progress Notes (Unsigned)
Cardiology Office Note    Date:  10/18/2021   ID:  Cabell Lazenby, DOB 1950/11/20, MRN [id number]  PCP:  [name], DO  Cardiologist:  Debbe Odea, MD  Electrophysiologist:  None   Chief Complaint: Hospital follow-up  History of Present Illness:   [name] is a 71 y.o. male with history of CAD status post prior RCA stenting, DM2, CKD stage III, HTN, HLD intolerant to statins, prior PE, and GERD who presents for hospital follow-up as outlined below.  He was evaluated in 01/2019 with fatigue and dyspnea with stress testing at that time being low risk, without evidence of ischemia or infarct.  He was subsequently admitted in 12/2019 with an NSTEMI.  LHC showed severe proximal RCA disease which was treated successfully with PCI/DES.  Echo during the admission showed an EF of 60 to 65% with trivial mitral regurgitation.  With symptoms of chest pain, he underwent Lexiscan MPI in 04/2021, that was without evidence of ischemia, felt to be low risk, and similar to study from 2020.  He was admitted to the hospital from 6/11 through 6/15 with E. coli bacteremia felt to be from urinary source as well as NSTEMI.  High-sensitivity troponin peaked at 1479, concerning for demand ischemia versus ACS.  BNP 71.6.  Echo showed an EF of 60 to 65%, no regional wall motion abnormalities, mild LVH, grade 2 diastolic dysfunction, normal RV systolic function with a mildly enlarged ventricular cavity size, mildly dilated right atrium, no significant valvular abnormalities, and an estimated right atrial pressure of 15 mmHg.  He was treated with 48 hours of IV heparin with recommendation to pursue cardiac cath once bacteremia had been adequately treated.  He comes in today and is doing reasonably well from a cardiac perspective.  Since his hospital discharge he has had intermittent randomly occurring chest tightness without associated symptoms.  Tightness will last for several minutes to  upwards of an hour.  He also continues to note ongoing fatigue since his hospital discharge.  No dyspnea, palpitations, dizziness, recent to be, or syncope.  No significant lower extremity swelling.  No falls or hematochezia.  Blood pressure is typically well controlled.  He has some mild chest tightness in the office this morning.  Otherwise, he is without complaints.   Labs independently reviewed: 08/2021 - magnesium 2.2, Hgb 11.6, PLT 238, potassium 3.8, BUN 16, serum creatinine 1.22, albumin 4.1, AST/ALT normal 07/2021 - TC 153, TG 198, HDL 32, LDL 91, A1c 7.9  Past Medical History:  Diagnosis Date   Allergy    CAD (coronary artery disease)    a. 01/2019 MV: EF 55-65%, no ischemia; b. 12/2019 NSTEMI/PCI: LM nl, LAD 40p, 26m, D1 40, LCX nl, OM3 40, RCA 90/85p (2.75x34 Resolute Onyx DES), 17m/d.   Chronic headaches    CKD (chronic kidney disease), stage III (HCC)    Edentulous    no lower teeth   GERD (gastroesophageal reflux disease)    Gout    Heart burn    History of echocardiogram    a. 12/2019 Echo: EF 60-65%, no rwma, triv MR.   Hyperlipidemia    Hypertension    Motion sickness    boats   NSTEMI (non-ST elevated myocardial infarction) (HCC) 12/27/2019   Pulmonary embolism (HCC) 05/2017   Type 2 diabetes mellitus [facility])     Past Surgical History:  Procedure Laterality Date   BACK SURGERY  02/26/2019   CATARACT EXTRACTION W/PHACO Left 04/14/2021   Procedure: CATARACT  EXTRACTION PHACO AND INTRAOCULAR LENS PLACEMENT ([place]) LEFT DIABETIC maylugin;  Surgeon: Leandrew Koyanagi, MD;  Location: [place];  Service: Ophthalmology;  Laterality: Left;  11.22 1:25.1   CATARACT EXTRACTION W/PHACO Right 06/09/2021   Procedure: CATARACT EXTRACTION PHACO AND INTRAOCULAR LENS PLACEMENT ([place]) RIGHT DIABETIC MALYUGIN;  Surgeon: Leandrew Koyanagi, MD;  Location: [place];  Service: Ophthalmology;  Laterality: Right;  Diabetic 4.75 01:05.7   KNEE SURGERY     LEFT HEART  CATH AND CORONARY ANGIOGRAPHY N/A 12/30/2019   Procedure: LEFT HEART CATH AND CORONARY ANGIOGRAPHY;  Surgeon: Wellington Hampshire, MD;  Location: [place] [facility];  Service: Cardiovascular;  Laterality: N/A;   SHOULDER SURGERY     TONSILLECTOMY      Current Medications: Current Meds  Medication Sig   ACCU-CHEK SMARTVIEW test strip TEST BLOOD SUGAR TWICE DAILY   acetaminophen (TYLENOL) 500 MG tablet Take 1,000 mg by mouth every 8 (eight) hours as needed.    allopurinol (ZYLOPRIM) 100 MG tablet TAKE 1 TABLET AT BEDTIME   aspirin EC 81 MG EC tablet Take 1 tablet (81 mg total) by mouth daily. Swallow whole.   carvedilol (COREG) 12.5 MG tablet TAKE 1 TABLET TWICE DAILY   clopidogrel (PLAVIX) 75 MG tablet Take 1 tablet (75 mg total) by mouth daily.   DROPLET PEN NEEDLES 32G X 4 MM MISC USE WITH [name] INSULIN INJECTION DAILY AS INSTRUCTED   ezetimibe (ZETIA) 10 MG tablet TAKE 1 TABLET EVERY DAY   fluticasone (FLONASE) 50 MCG/ACT nasal spray USE 2 SPRAYS IN EACH NOSTRIL ONCE DAILY USE FOR 4 TO 6 WEEKS, THEN STOP AND USE SEASONALLY OR AS NEEDED.   Insulin Glargine ([name] KWIKPEN [employer]) Inject 36 Units into the skin at bedtime.   isosorbide mononitrate (IMDUR) 30 MG 24 hr tablet Take 0.5 tablets (15 mg total) by mouth daily.   lisinopril (ZESTRIL) 40 MG tablet TAKE 1 TABLET EVERY DAY   nitroGLYCERIN (NITROSTAT) 0.4 MG SL tablet Place 1 tablet (0.4 mg total) under the tongue every 5 (five) minutes as needed for chest pain.   pantoprazole (PROTONIX) 20 MG tablet Take 1 tablet (20 mg total) by mouth 2 (two) times daily before a meal.   RYBELSUS 14 MG TABS Take 1 tablet (14 mg total) by mouth daily before breakfast.    Allergies:   Crestor [rosuvastatin calcium], Ciprofloxacin, Invokana [canagliflozin], Prednisone, and Sulfamethizole   Social History   Socioeconomic History   Marital status: Married    Spouse name: Not on file   Number of children: Not on file   Years of education: Not on  file   Highest education level: Not on file  Occupational History   Occupation: retired  Tobacco Use   Smoking status: Former    Types: Cigars    Quit date: 2000    Years since quitting: 23.5   Smokeless tobacco: Current    Types: Chew   Tobacco comments:    wife states patient never smoked cigarettes, just chewing tobacco  Vaping Use   Vaping Use: Never used  Substance and Sexual Activity   Alcohol use: Not Currently    Alcohol/week: 0.0 standard drinks of alcohol   Drug use: No   Sexual activity: Yes    Birth control/protection: Inserts  Other Topics Concern   Not on file  Social History Narrative   Not on file   Social Determinants of Health   Financial Resource Strain: Low Risk  (01/26/2021)   Overall Financial Resource Strain (CARDIA)  Difficulty of Paying Living Expenses: Not hard at all  Food Insecurity: No Food Insecurity (01/26/2021)   Hunger Vital Sign    Worried About Running Out of Food in the Last Year: Never true    Ran Out of Food in the Last Year: Never true  Transportation Needs: No Transportation Needs (01/26/2021)   PRAPARE - Administrator, Civil Service (Medical): No    Lack of Transportation (Non-Medical): No  Physical Activity: Inactive (01/26/2021)   Exercise Vital Sign    Days of Exercise per Week: 0 days    Minutes of Exercise per Session: 0 min  Stress: No Stress Concern Present (01/26/2021)   [employer] of Occupational Health - Occupational Stress Questionnaire    Feeling of Stress : Not at all  Social Connections: Somewhat Isolated (08/29/2017)   Social Connection and Isolation Panel [NHANES]    Frequency of Communication with Friends and Family: More than three times a week    Frequency of Social Gatherings with Friends and Family: More than three times a week    Attends Religious Services: Never    Database administrator or Organizations: No    Attends Engineer, structural: Never    Marital Status: Married      Family History:  The patient's family history includes Cancer in his father; Heart disease in his brother and sister. There is no history of Prostate cancer.  ROS:   12-point review of systems is negative unless otherwise noted in HPI.   EKGs/Labs/Other Studies Reviewed:    Studies reviewed were summarized above. The additional studies were reviewed today:  2D echo 09/07/2021: 1. Left ventricular ejection fraction, by estimation, is 60 to 65%. The  left ventricle has normal function. The left ventricle has no regional  wall motion abnormalities. There is mild left ventricular hypertrophy.  Left ventricular diastolic parameters  are consistent with Grade II diastolic dysfunction (pseudonormalization).  Elevated left atrial pressure.   2. Right ventricular systolic function is normal. The right ventricular  size is mildly enlarged. Tricuspid regurgitation signal is inadequate for  assessing PA pressure.   3. Right atrial size was mildly dilated.   4. The mitral valve is normal in structure. No evidence of mitral valve  regurgitation. No evidence of mitral stenosis.   5. The aortic valve is tricuspid. Aortic valve regurgitation is not  visualized. No aortic stenosis is present.   6. The inferior vena cava is dilated in size with <50% respiratory  variability, suggesting right atrial pressure of 15 mmHg. __________  Eugenie Birks MPI 05/11/2021:   Low risk, probably normal pharmacologic myocardial perfusion stress test.   There is a moderate in size, mild-moderate in severity, fixed apical inferior, apical lateral, and apical defect that most likely represents artifact but cannot rule out an element of scar.   There is no significant ischemia.   Left ventricular systolic function is low normal (LVEF 53%).   Coronary artery calcification and RCA stent are noted, as well as aortic atherosclerosis.  Query trivial pericardial effusion versus mild pericardial thickening.   There have been no  significant changes compared to the prior study from 02/18/2019. __________  [facility] 12/30/2019: Prox RCA-1 lesion is 90% stenosed. Prox RCA-2 lesion is 85% stenosed. Mid RCA lesion is 30% stenosed. Dist RCA lesion is 30% stenosed. 3rd Mrg lesion is 40% stenosed. Prox LAD lesion is 40% stenosed. Mid LAD lesion is 30% stenosed. 1st Diag lesion is 40% stenosed. A drug-eluting stent was successfully  placed using a STENT RESOLUTE ONYX [id number]. Post intervention, there is a 0% residual stenosis. Post intervention, there is a 0% residual stenosis.   1.  Severe one-vessel coronary artery disease involving proximal right coronary artery.  There is also moderate LAD and left circumflex disease. 2.  Left ventricular angiography was not performed due to chronic kidney disease.  EF was normal by echo.  Mildly elevated left ventricular end-diastolic pressure around 20 mmHg. 3.  Successful angioplasty and drug-eluting stent placement to the proximal right coronary artery.   Recommendations: Dual antiplatelet therapy for at least 1 year. Aggressive treatment of risk factors.  I increase atorvastatin to 40 mg daily to see if the patient can tolerate. I switch metoprolol to carvedilol for better blood pressure control. Hydrate overnight and check renal function in the morning.  75 mL of contrast was used.  __________  2D echo 12/29/2019: 1. Left ventricular ejection fraction, by estimation, is 60 to 65%. The  left ventricle has normal function. The left ventricle has no regional  wall motion abnormalities. Left ventricular diastolic parameters were  normal.   2. Right ventricular systolic function was not well visualized. The right  ventricular size is not well visualized.   3. The mitral valve is grossly normal. Trivial mitral valve  regurgitation. No evidence of mitral stenosis.   4. The aortic valve is grossly normal. Aortic valve regurgitation is not  visualized. No aortic stenosis is present.    Conclusion(s)/Recommendation(s): Technically challenging study, even with  use of definity. Grossly normal EF and wall motion, but reduced  sensitivity for focal abnormalities. __________  Eugenie Birks MPI 02/18/2019: There was no ST segment deviation noted during stress. No T wave inversion was noted during stress. The study is normal. This is a low risk study. The left ventricular ejection fraction is normal (55-65%). __________  2D echo 12/06/2016: - Procedure narrative: Transthoracic echocardiography. The study    was technically difficult due to chest wall and/or lung    interference.  - Left ventricle: The cavity size was normal. Wall thickness was    increased in a pattern of mild LVH. Left ventricular ejection    fraction is grossly normal but is suboptimally assessed due to    poor acoustic windows. The study is not technically sufficient to    allow evaluation of LV diastolic function.  - Right ventricle: Poorly visualized. Right ventricle is upper    normal to mildly dilated. Contraction is grossly normal in the    parasternal views but is suboptimally characterized due to poor    acoustic windows.   EKG:  EKG is ordered today.  The EKG ordered today demonstrates NSR, 64 bpm, first-degree AV block, left axis deviation, prior anteroseptal infarct, poor R wave progression along the precordial leads, no acute ST-T changes, no significant change when compared to prior tracing  Recent Labs: 11/03/2020: TSH 3.16 09/05/2021: ALT 20; B Natriuretic Peptide 71.6 09/09/2021: BUN 16; Creatinine, Ser 1.22; Hemoglobin 11.6; Magnesium 2.2; Platelets 238; Potassium 3.8; Sodium 141  Recent Lipid Panel    Component Value Date/Time   CHOL 153 08/05/2021 0818   CHOL 212 (H) 11/12/2019 0829   TRIG 198 (H) 08/05/2021 0818   HDL 32 (L) 08/05/2021 0818   HDL 32 (L) 11/12/2019 0829   CHOLHDL 4.8 08/05/2021 0818   VLDL 37 12/29/2019 0026   LDLCALC 91 08/05/2021 0818    PHYSICAL EXAM:     VS:  BP (!) 158/70 (BP Location: Left Arm, Patient Position: Sitting,  Cuff Size: Normal)   Pulse 64   Ht 5\' 11"  (1.803 m)   Wt 228 lb 8 oz (103.6 kg)   SpO2 96%   BMI 31.87 kg/m   BMI: Body mass index is 31.87 kg/m.  Physical Exam Vitals reviewed.  Constitutional:      Appearance: He is well-developed.  HENT:     Head: Normocephalic and atraumatic.  Eyes:     General:        Right eye: No discharge.        Left eye: No discharge.  Neck:     Vascular: No JVD.  Cardiovascular:     Rate and Rhythm: Normal rate and regular rhythm.     Pulses:          Posterior tibial pulses are 2+ on the right side and 2+ on the left side.     Heart sounds: Normal heart sounds, S1 normal and S2 normal. Heart sounds not distant. No midsystolic click and no opening snap. No murmur heard.    No friction rub.  Pulmonary:     Effort: Pulmonary effort is normal. No respiratory distress.     Breath sounds: Normal breath sounds. No decreased breath sounds, wheezing or rales.  Chest:     Chest wall: No tenderness.  Abdominal:     General: There is no distension.  Musculoskeletal:     Cervical back: Normal range of motion.     Right lower leg: No edema.     Left lower leg: No edema.  Skin:    General: Skin is warm and dry.     Nails: There is no clubbing.  Neurological:     Mental Status: He is alert and oriented to person, place, and time.  Psychiatric:        Speech: Speech normal.        Behavior: Behavior normal.        Thought Content: Thought content normal.        Judgment: Judgment normal.     Wt Readings from Last 3 Encounters:  10/18/21 228 lb 8 oz (103.6 kg)  09/16/21 229 lb (103.9 kg)  09/05/21 230 lb (104.3 kg)     ASSESSMENT & PLAN:   CAD involving the native coronary arteries with recent NSTEMI with other forms of angina: Since his hospital discharge, he has had intermittent substernal chest tightness that is randomly occurring.  He currently has mild substernal chest  tightness.  We will obtain a stat troponin in the medical mall with recommendation to proceed to the ED if this value is elevated.  He will require a cardiac cath given his recent NSTEMI.  However, timing of cath will be based on troponin level obtained at this morning.  He would like for his cardiac cath to be performed through femoral access due to discomfort noted with radial access.  Continue aggressive risk factor modification and secondary prevention including aspirin, clopidogrel, carvedilol, ezetimibe, Imdur, and lisinopril.  He is intolerant to statins.  HTN: Blood pressure is mildly elevated in the office today, though typically well controlled.  Continue to monitor.  Continue current medications.  HLD: LDL 91 in 07/2021.  Intolerant to statins.  Currently on ezetimibe.  Goal LDL at least less than 70.  Following cath, we will likely refer him to the lipid clinic for consideration of PCSK9 inhibitor.  CKD stage III: Stable on recent BMP.  History of PE: Previously on anticoagulation.  CTA chest in 04/2021 without evidence of  PE.   Shared Decision Making/Informed Consent{  The risks [stroke (1 in 1000), death (1 in 1000), kidney failure [usually temporary] (1 in 500), bleeding (1 in 200), allergic reaction [possibly serious] (1 in 200)], benefits (diagnostic support and management of coronary artery disease) and alternatives of a cardiac catheterization were discussed in detail with Mr. Kreps and he is willing to proceed.     Disposition: F/u with Dr. Azucena Cecil or an APP after testing.   Medication Adjustments/Labs and Tests Ordered: Current medicines are reviewed at length with the patient today.  Concerns regarding medicines are outlined above. Medication changes, Labs and Tests ordered today are summarized above and listed in the Patient Instructions accessible in Encounters.   Signed, Eula Listen, PA-C 10/18/2021 9:27 AM     [facility] [place] [address] [place], [place] [address] [phone]

## 2021-10-14 NOTE — H&P (View-Only) (Signed)
Cardiology Office Note    Date:  10/18/2021   ID:  Cabell Lazenby, DOB 1950/11/20, MRN [id number]  PCP:  [name], DO  Cardiologist:  Debbe Odea, MD  Electrophysiologist:  None   Chief Complaint: Hospital follow-up  History of Present Illness:   [name] is a 71 y.o. male with history of CAD status post prior RCA stenting, DM2, CKD stage III, HTN, HLD intolerant to statins, prior PE, and GERD who presents for hospital follow-up as outlined below.  He was evaluated in 01/2019 with fatigue and dyspnea with stress testing at that time being low risk, without evidence of ischemia or infarct.  He was subsequently admitted in 12/2019 with an NSTEMI.  LHC showed severe proximal RCA disease which was treated successfully with PCI/DES.  Echo during the admission showed an EF of 60 to 65% with trivial mitral regurgitation.  With symptoms of chest pain, he underwent Lexiscan MPI in 04/2021, that was without evidence of ischemia, felt to be low risk, and similar to study from 2020.  He was admitted to the hospital from 6/11 through 6/15 with E. coli bacteremia felt to be from urinary source as well as NSTEMI.  High-sensitivity troponin peaked at 1479, concerning for demand ischemia versus ACS.  BNP 71.6.  Echo showed an EF of 60 to 65%, no regional wall motion abnormalities, mild LVH, grade 2 diastolic dysfunction, normal RV systolic function with a mildly enlarged ventricular cavity size, mildly dilated right atrium, no significant valvular abnormalities, and an estimated right atrial pressure of 15 mmHg.  He was treated with 48 hours of IV heparin with recommendation to pursue cardiac cath once bacteremia had been adequately treated.  He comes in today and is doing reasonably well from a cardiac perspective.  Since his hospital discharge he has had intermittent randomly occurring chest tightness without associated symptoms.  Tightness will last for several minutes to  upwards of an hour.  He also continues to note ongoing fatigue since his hospital discharge.  No dyspnea, palpitations, dizziness, recent to be, or syncope.  No significant lower extremity swelling.  No falls or hematochezia.  Blood pressure is typically well controlled.  He has some mild chest tightness in the office this morning.  Otherwise, he is without complaints.   Labs independently reviewed: 08/2021 - magnesium 2.2, Hgb 11.6, PLT 238, potassium 3.8, BUN 16, serum creatinine 1.22, albumin 4.1, AST/ALT normal 07/2021 - TC 153, TG 198, HDL 32, LDL 91, A1c 7.9  Past Medical History:  Diagnosis Date   Allergy    CAD (coronary artery disease)    a. 01/2019 MV: EF 55-65%, no ischemia; b. 12/2019 NSTEMI/PCI: LM nl, LAD 40p, 26m, D1 40, LCX nl, OM3 40, RCA 90/85p (2.75x34 Resolute Onyx DES), 17m/d.   Chronic headaches    CKD (chronic kidney disease), stage III (HCC)    Edentulous    no lower teeth   GERD (gastroesophageal reflux disease)    Gout    Heart burn    History of echocardiogram    a. 12/2019 Echo: EF 60-65%, no rwma, triv MR.   Hyperlipidemia    Hypertension    Motion sickness    boats   NSTEMI (non-ST elevated myocardial infarction) (HCC) 12/27/2019   Pulmonary embolism (HCC) 05/2017   Type 2 diabetes mellitus [facility])     Past Surgical History:  Procedure Laterality Date   BACK SURGERY  02/26/2019   CATARACT EXTRACTION W/PHACO Left 04/14/2021   Procedure: CATARACT  EXTRACTION PHACO AND INTRAOCULAR LENS PLACEMENT ([place]) LEFT DIABETIC maylugin;  Surgeon: Leandrew Koyanagi, MD;  Location: [place];  Service: Ophthalmology;  Laterality: Left;  11.22 1:25.1   CATARACT EXTRACTION W/PHACO Right 06/09/2021   Procedure: CATARACT EXTRACTION PHACO AND INTRAOCULAR LENS PLACEMENT ([place]) RIGHT DIABETIC MALYUGIN;  Surgeon: Leandrew Koyanagi, MD;  Location: [place];  Service: Ophthalmology;  Laterality: Right;  Diabetic 4.75 01:05.7   KNEE SURGERY     LEFT HEART  CATH AND CORONARY ANGIOGRAPHY N/A 12/30/2019   Procedure: LEFT HEART CATH AND CORONARY ANGIOGRAPHY;  Surgeon: Wellington Hampshire, MD;  Location: [place] [facility];  Service: Cardiovascular;  Laterality: N/A;   SHOULDER SURGERY     TONSILLECTOMY      Current Medications: Current Meds  Medication Sig   ACCU-CHEK SMARTVIEW test strip TEST BLOOD SUGAR TWICE DAILY   acetaminophen (TYLENOL) 500 MG tablet Take 1,000 mg by mouth every 8 (eight) hours as needed.    allopurinol (ZYLOPRIM) 100 MG tablet TAKE 1 TABLET AT BEDTIME   aspirin EC 81 MG EC tablet Take 1 tablet (81 mg total) by mouth daily. Swallow whole.   carvedilol (COREG) 12.5 MG tablet TAKE 1 TABLET TWICE DAILY   clopidogrel (PLAVIX) 75 MG tablet Take 1 tablet (75 mg total) by mouth daily.   DROPLET PEN NEEDLES 32G X 4 MM MISC USE WITH [name] INSULIN INJECTION DAILY AS INSTRUCTED   ezetimibe (ZETIA) 10 MG tablet TAKE 1 TABLET EVERY DAY   fluticasone (FLONASE) 50 MCG/ACT nasal spray USE 2 SPRAYS IN EACH NOSTRIL ONCE DAILY USE FOR 4 TO 6 WEEKS, THEN STOP AND USE SEASONALLY OR AS NEEDED.   Insulin Glargine ([name] KWIKPEN [place]) Inject 36 Units into the skin at bedtime.   isosorbide mononitrate (IMDUR) 30 MG 24 hr tablet Take 0.5 tablets (15 mg total) by mouth daily.   lisinopril (ZESTRIL) 40 MG tablet TAKE 1 TABLET EVERY DAY   nitroGLYCERIN (NITROSTAT) 0.4 MG SL tablet Place 1 tablet (0.4 mg total) under the tongue every 5 (five) minutes as needed for chest pain.   pantoprazole (PROTONIX) 20 MG tablet Take 1 tablet (20 mg total) by mouth 2 (two) times daily before a meal.   RYBELSUS 14 MG TABS Take 1 tablet (14 mg total) by mouth daily before breakfast.    Allergies:   Crestor [rosuvastatin calcium], Ciprofloxacin, Invokana [canagliflozin], Prednisone, and Sulfamethizole   Social History   Socioeconomic History   Marital status: Married    Spouse name: Not on file   Number of children: Not on file   Years of education: Not on  file   Highest education level: Not on file  Occupational History   Occupation: retired  Tobacco Use   Smoking status: Former    Types: Cigars    Quit date: 2000    Years since quitting: 23.5   Smokeless tobacco: Current    Types: Chew   Tobacco comments:    wife states patient never smoked cigarettes, just chewing tobacco  Vaping Use   Vaping Use: Never used  Substance and Sexual Activity   Alcohol use: Not Currently    Alcohol/week: 0.0 standard drinks of alcohol   Drug use: No   Sexual activity: Yes    Birth control/protection: Inserts  Other Topics Concern   Not on file  Social History Narrative   Not on file   Social Determinants of Health   Financial Resource Strain: Low Risk  (01/26/2021)   Overall Financial Resource Strain (CARDIA)  Difficulty of Paying Living Expenses: Not hard at all  Food Insecurity: No Food Insecurity (01/26/2021)   Hunger Vital Sign    Worried About Running Out of Food in the Last Year: Never true    Ran Out of Food in the Last Year: Never true  Transportation Needs: No Transportation Needs (01/26/2021)   PRAPARE - Administrator, Civil Service (Medical): No    Lack of Transportation (Non-Medical): No  Physical Activity: Inactive (01/26/2021)   Exercise Vital Sign    Days of Exercise per Week: 0 days    Minutes of Exercise per Session: 0 min  Stress: No Stress Concern Present (01/26/2021)   [employer] of Occupational Health - Occupational Stress Questionnaire    Feeling of Stress : Not at all  Social Connections: Somewhat Isolated (08/29/2017)   Social Connection and Isolation Panel [NHANES]    Frequency of Communication with Friends and Family: More than three times a week    Frequency of Social Gatherings with Friends and Family: More than three times a week    Attends Religious Services: Never    Database administrator or Organizations: No    Attends Engineer, structural: Never    Marital Status: Married      Family History:  The patient's family history includes Cancer in his father; Heart disease in his brother and sister. There is no history of Prostate cancer.  ROS:   12-point review of systems is negative unless otherwise noted in HPI.   EKGs/Labs/Other Studies Reviewed:    Studies reviewed were summarized above. The additional studies were reviewed today:  2D echo 09/07/2021: 1. Left ventricular ejection fraction, by estimation, is 60 to 65%. The  left ventricle has normal function. The left ventricle has no regional  wall motion abnormalities. There is mild left ventricular hypertrophy.  Left ventricular diastolic parameters  are consistent with Grade II diastolic dysfunction (pseudonormalization).  Elevated left atrial pressure.   2. Right ventricular systolic function is normal. The right ventricular  size is mildly enlarged. Tricuspid regurgitation signal is inadequate for  assessing PA pressure.   3. Right atrial size was mildly dilated.   4. The mitral valve is normal in structure. No evidence of mitral valve  regurgitation. No evidence of mitral stenosis.   5. The aortic valve is tricuspid. Aortic valve regurgitation is not  visualized. No aortic stenosis is present.   6. The inferior vena cava is dilated in size with <50% respiratory  variability, suggesting right atrial pressure of 15 mmHg. __________  Eugenie Birks MPI 05/11/2021:   Low risk, probably normal pharmacologic myocardial perfusion stress test.   There is a moderate in size, mild-moderate in severity, fixed apical inferior, apical lateral, and apical defect that most likely represents artifact but cannot rule out an element of scar.   There is no significant ischemia.   Left ventricular systolic function is low normal (LVEF 53%).   Coronary artery calcification and RCA stent are noted, as well as aortic atherosclerosis.  Query trivial pericardial effusion versus mild pericardial thickening.   There have been no  significant changes compared to the prior study from 02/18/2019. __________  [facility] 12/30/2019: Prox RCA-1 lesion is 90% stenosed. Prox RCA-2 lesion is 85% stenosed. Mid RCA lesion is 30% stenosed. Dist RCA lesion is 30% stenosed. 3rd Mrg lesion is 40% stenosed. Prox LAD lesion is 40% stenosed. Mid LAD lesion is 30% stenosed. 1st Diag lesion is 40% stenosed. A drug-eluting stent was successfully  placed using a STENT RESOLUTE ONYX [id number]. Post intervention, there is a 0% residual stenosis. Post intervention, there is a 0% residual stenosis.   1.  Severe one-vessel coronary artery disease involving proximal right coronary artery.  There is also moderate LAD and left circumflex disease. 2.  Left ventricular angiography was not performed due to chronic kidney disease.  EF was normal by echo.  Mildly elevated left ventricular end-diastolic pressure around 20 mmHg. 3.  Successful angioplasty and drug-eluting stent placement to the proximal right coronary artery.   Recommendations: Dual antiplatelet therapy for at least 1 year. Aggressive treatment of risk factors.  I increase atorvastatin to 40 mg daily to see if the patient can tolerate. I switch metoprolol to carvedilol for better blood pressure control. Hydrate overnight and check renal function in the morning.  75 mL of contrast was used.  __________  2D echo 12/29/2019: 1. Left ventricular ejection fraction, by estimation, is 60 to 65%. The  left ventricle has normal function. The left ventricle has no regional  wall motion abnormalities. Left ventricular diastolic parameters were  normal.   2. Right ventricular systolic function was not well visualized. The right  ventricular size is not well visualized.   3. The mitral valve is grossly normal. Trivial mitral valve  regurgitation. No evidence of mitral stenosis.   4. The aortic valve is grossly normal. Aortic valve regurgitation is not  visualized. No aortic stenosis is present.    Conclusion(s)/Recommendation(s): Technically challenging study, even with  use of definity. Grossly normal EF and wall motion, but reduced  sensitivity for focal abnormalities. __________  Eugenie Birks MPI 02/18/2019: There was no ST segment deviation noted during stress. No T wave inversion was noted during stress. The study is normal. This is a low risk study. The left ventricular ejection fraction is normal (55-65%). __________  2D echo 12/06/2016: - Procedure narrative: Transthoracic echocardiography. The study    was technically difficult due to chest wall and/or lung    interference.  - Left ventricle: The cavity size was normal. Wall thickness was    increased in a pattern of mild LVH. Left ventricular ejection    fraction is grossly normal but is suboptimally assessed due to    poor acoustic windows. The study is not technically sufficient to    allow evaluation of LV diastolic function.  - Right ventricle: Poorly visualized. Right ventricle is upper    normal to mildly dilated. Contraction is grossly normal in the    parasternal views but is suboptimally characterized due to poor    acoustic windows.   EKG:  EKG is ordered today.  The EKG ordered today demonstrates NSR, 64 bpm, first-degree AV block, left axis deviation, prior anteroseptal infarct, poor R wave progression along the precordial leads, no acute ST-T changes, no significant change when compared to prior tracing  Recent Labs: 11/03/2020: TSH 3.16 09/05/2021: ALT 20; B Natriuretic Peptide 71.6 09/09/2021: BUN 16; Creatinine, Ser 1.22; Hemoglobin 11.6; Magnesium 2.2; Platelets 238; Potassium 3.8; Sodium 141  Recent Lipid Panel    Component Value Date/Time   CHOL 153 08/05/2021 0818   CHOL 212 (H) 11/12/2019 0829   TRIG 198 (H) 08/05/2021 0818   HDL 32 (L) 08/05/2021 0818   HDL 32 (L) 11/12/2019 0829   CHOLHDL 4.8 08/05/2021 0818   VLDL 37 12/29/2019 0026   LDLCALC 91 08/05/2021 0818    PHYSICAL EXAM:     VS:  BP (!) 158/70 (BP Location: Left Arm, Patient Position: Sitting,  Cuff Size: Normal)   Pulse 64   Ht 5\' 11"  (1.803 m)   Wt 228 lb 8 oz (103.6 kg)   SpO2 96%   BMI 31.87 kg/m   BMI: Body mass index is 31.87 kg/m.  Physical Exam Vitals reviewed.  Constitutional:      Appearance: He is well-developed.  HENT:     Head: Normocephalic and atraumatic.  Eyes:     General:        Right eye: No discharge.        Left eye: No discharge.  Neck:     Vascular: No JVD.  Cardiovascular:     Rate and Rhythm: Normal rate and regular rhythm.     Pulses:          Posterior tibial pulses are 2+ on the right side and 2+ on the left side.     Heart sounds: Normal heart sounds, S1 normal and S2 normal. Heart sounds not distant. No midsystolic click and no opening snap. No murmur heard.    No friction rub.  Pulmonary:     Effort: Pulmonary effort is normal. No respiratory distress.     Breath sounds: Normal breath sounds. No decreased breath sounds, wheezing or rales.  Chest:     Chest wall: No tenderness.  Abdominal:     General: There is no distension.  Musculoskeletal:     Cervical back: Normal range of motion.     Right lower leg: No edema.     Left lower leg: No edema.  Skin:    General: Skin is warm and dry.     Nails: There is no clubbing.  Neurological:     Mental Status: He is alert and oriented to person, place, and time.  Psychiatric:        Speech: Speech normal.        Behavior: Behavior normal.        Thought Content: Thought content normal.        Judgment: Judgment normal.     Wt Readings from Last 3 Encounters:  10/18/21 228 lb 8 oz (103.6 kg)  09/16/21 229 lb (103.9 kg)  09/05/21 230 lb (104.3 kg)     ASSESSMENT & PLAN:   CAD involving the native coronary arteries with recent NSTEMI with other forms of angina: Since his hospital discharge, he has had intermittent substernal chest tightness that is randomly occurring.  He currently has mild substernal chest  tightness.  We will obtain a stat troponin in the medical mall with recommendation to proceed to the ED if this value is elevated.  He will require a cardiac cath given his recent NSTEMI.  However, timing of cath will be based on troponin level obtained at this morning.  He would like for his cardiac cath to be performed through femoral access due to discomfort noted with radial access.  Continue aggressive risk factor modification and secondary prevention including aspirin, clopidogrel, carvedilol, ezetimibe, Imdur, and lisinopril.  He is intolerant to statins.  HTN: Blood pressure is mildly elevated in the office today, though typically well controlled.  Continue to monitor.  Continue current medications.  HLD: LDL 91 in 07/2021.  Intolerant to statins.  Currently on ezetimibe.  Goal LDL at least less than 70.  Following cath, we will likely refer him to the lipid clinic for consideration of PCSK9 inhibitor.  CKD stage III: Stable on recent BMP.  History of PE: Previously on anticoagulation.  CTA chest in 04/2021 without evidence of  PE.   Shared Decision Making/Informed Consent{  The risks [stroke (1 in 1000), death (1 in 1000), kidney failure [usually temporary] (1 in 500), bleeding (1 in 200), allergic reaction [possibly serious] (1 in 200)], benefits (diagnostic support and management of coronary artery disease) and alternatives of a cardiac catheterization were discussed in detail with [name] and he is willing to proceed.     Disposition: F/u with Dr. Azucena Cecil or an APP after testing.   Medication Adjustments/Labs and Tests Ordered: Current medicines are reviewed at length with the patient today.  Concerns regarding medicines are outlined above. Medication changes, Labs and Tests ordered today are summarized above and listed in the Patient Instructions accessible in Encounters.   Signed, Eula Listen, PA-C 10/18/2021 9:27 AM     [facility] [place] [address] [place], [place] [address] [phone]

## 2021-10-18 ENCOUNTER — Encounter: Payer: Self-pay | Admitting: Physician Assistant

## 2021-10-18 ENCOUNTER — Other Ambulatory Visit: Payer: Self-pay | Admitting: Physician Assistant

## 2021-10-18 ENCOUNTER — Other Ambulatory Visit
Admission: RE | Admit: 2021-10-18 | Discharge: 2021-10-18 | Disposition: A | Discharge status: Home / Self Care | Payer: Medicare HMO | Source: Ambulatory Visit | Attending: Physician Assistant | Admitting: Physician Assistant

## 2021-10-18 ENCOUNTER — Ambulatory Visit: Payer: Medicare HMO | Admitting: Physician Assistant

## 2021-10-18 VITALS — BP 158/70 | HR 64 | Ht 71.0 in | Wt 228.5 lb

## 2021-10-18 DIAGNOSIS — Z86711 Personal history of pulmonary embolism: Secondary | ICD-10-CM | POA: Diagnosis not present

## 2021-10-18 DIAGNOSIS — E785 Hyperlipidemia, unspecified: Secondary | ICD-10-CM | POA: Diagnosis not present

## 2021-10-18 DIAGNOSIS — I2510 Atherosclerotic heart disease of native coronary artery without angina pectoris: Secondary | ICD-10-CM | POA: Diagnosis not present

## 2021-10-18 DIAGNOSIS — Z9861 Coronary angioplasty status: Secondary | ICD-10-CM | POA: Insufficient documentation

## 2021-10-18 DIAGNOSIS — I214 Non-ST elevation (NSTEMI) myocardial infarction: Secondary | ICD-10-CM

## 2021-10-18 DIAGNOSIS — I25118 Atherosclerotic heart disease of native coronary artery with other forms of angina pectoris: Secondary | ICD-10-CM | POA: Diagnosis not present

## 2021-10-18 DIAGNOSIS — N183 Chronic kidney disease, stage 3 unspecified: Secondary | ICD-10-CM | POA: Diagnosis not present

## 2021-10-18 DIAGNOSIS — I10 Essential (primary) hypertension: Secondary | ICD-10-CM | POA: Diagnosis not present

## 2021-10-18 LAB — TROPONIN I (HIGH SENSITIVITY): Troponin I (High Sensitivity): 6 ng/L (ref <18)

## 2021-10-18 NOTE — Addendum Note (Signed)
Addended by: Bryna Colander on: 10/18/2021 12:21 PM   Modules accepted: Orders

## 2021-10-18 NOTE — Progress Notes (Signed)
Orders for cath have been signed and held. 

## 2021-10-18 NOTE — Progress Notes (Addendum)
Spoke with patient and wife via speaker phone. Reviewed plan of care and need for additional labs and procedure. Discussed what day would work for them and advised I would call back with time for that procedure. Also offered to leave written instructions up front but they declined and requested that I send them via My Chart. Advised that provider did not recommend them going on a trip at this time. They replied that they already had it set up. They acknowledged the recommendations but said they already have it arranged. They verbalized understanding with no further questions for now.

## 2021-10-18 NOTE — Progress Notes (Signed)
Called and spoke with patient. Reviewed procedure scheduled for 10/26/21 with arrival time of 06:30 am. Also discussed medications, labs needed, and insulin the night before. He verbalized understanding of all instructions with no further questions at this time.

## 2021-10-18 NOTE — Patient Instructions (Addendum)
Medication Instructions:   NONE  *If you need a refill on your cardiac medications before your next appointment, please call your pharmacy*   Lab Work: Your physician recommends that you return for lab work in: STAT TROPONIN @ [facility].   CBC & BMP over at the [facility]  If you have labs (blood work) drawn today and your tests are completely normal, you will receive your results only by: MyChart Message (if you have MyChart) OR A paper copy in the mail If you have any lab test that is abnormal or we need to change your treatment, we will call you to review the results.   Testing/Procedures: [facility] Cardiac Cath Instructions  You are scheduled for a Cardiac Cath on: Tuesday October 26, 2021 with Dr. [name] Please arrive at 06:30 am on the day of your procedure Please expect a call from our [facility] to pre-register you Do not eat/drink anything after midnight Someone will need to drive you home It is recommended someone be with you for the first 24 hours after your procedure Wear clothes that are easy to get on/off and wear slip on shoes if possible   Medications bring a current list of all medications with you  _XX__ Decrease insulin the night before to 18 units. No diabetes medications the morning of your procedure.  _XX__ You may take all of your other medications the morning of your procedure with enough water to swallow safely   Day of your procedure: Arrive at the [facility] entrance.  Free valet service is available.  After entering the [facility] please check-in at the registration desk (1st desk on your right) to receive your armband. After receiving your armband someone will escort you to the cardiac cath/special procedures waiting area.  The usual length of stay after your procedure is about 2 to 3 hours.  This can vary.  If you have any questions, please call our office at [phone], or you may call the cardiac cath lab at [facility]  directly at [phone]   Follow-Up: At [facility], you and your health needs are our priority.  As part of our continuing mission to provide you with exceptional heart care, we have created designated Provider Care Teams.  These Care Teams include your primary Cardiologist (physician) and Advanced Practice Providers (APPs -  Physician Assistants and Nurse Practitioners) who all work together to provide you with the care you need, when you need it.  Follow up in 3 weeks    The format for your next appointment:   In Person  Provider:   You may see Debbe Odea, MD or Eula Listen PA-C   Other Instructions  Please be on stand by until STAT Troponin result returns.  We will give you a call once results have been received for your next steps.    Important Information About Sugar

## 2021-10-19 ENCOUNTER — Telehealth: Payer: Self-pay | Admitting: *Deleted

## 2021-10-19 NOTE — Telephone Encounter (Signed)
-----   Message from Yvonne Kendall, MD sent at 10/18/2021 12:38 PM EDT ----- Ward [name],  I have to give a talk to EMS that morning from 9-10 AM.  Would it be possible to reschedule the cath for anytime after 10?  Thanks.  [name]  ----- Message ----- From: Bryna Colander, RN Sent: 10/18/2021  12:22 PM EDT To: Yvonne Kendall, MD; Sondra Barges, PA-C; #  Precert Left Heart Cath 10/26/21 07:30 am [facility] Ordered by Eula Listen PA-C To be done by Dr. [name]

## 2021-10-19 NOTE — Telephone Encounter (Signed)
Spoke with patient that we had to reschedule his procedure. We have kept it on the same day but changed the time to 10:30 am with arrival time of 09:30 am. He was agreeable with this change and had no further questions at this time.

## 2021-10-20 ENCOUNTER — Other Ambulatory Visit
Admission: RE | Admit: 2021-10-20 | Discharge: 2021-10-20 | Disposition: A | Discharge status: Home / Self Care | Payer: Medicare HMO | Source: Ambulatory Visit | Attending: Physician Assistant | Admitting: Physician Assistant

## 2021-10-20 DIAGNOSIS — E785 Hyperlipidemia, unspecified: Secondary | ICD-10-CM | POA: Diagnosis not present

## 2021-10-20 DIAGNOSIS — I25118 Atherosclerotic heart disease of native coronary artery with other forms of angina pectoris: Secondary | ICD-10-CM | POA: Insufficient documentation

## 2021-10-20 DIAGNOSIS — I214 Non-ST elevation (NSTEMI) myocardial infarction: Secondary | ICD-10-CM | POA: Diagnosis not present

## 2021-10-20 DIAGNOSIS — I10 Essential (primary) hypertension: Secondary | ICD-10-CM | POA: Diagnosis not present

## 2021-10-20 DIAGNOSIS — N183 Chronic kidney disease, stage 3 unspecified: Secondary | ICD-10-CM | POA: Diagnosis not present

## 2021-10-20 DIAGNOSIS — Z86711 Personal history of pulmonary embolism: Secondary | ICD-10-CM | POA: Insufficient documentation

## 2021-10-20 LAB — BASIC METABOLIC PANEL
Anion gap: 8 (ref 5–15)
BUN: 22 mg/dL (ref 8–23)
CO2: 22 mmol/L (ref 22–32)
Calcium: 9.1 mg/dL (ref 8.9–10.3)
Chloride: 105 mmol/L (ref 98–111)
Creatinine, Ser: 1.64 mg/dL — ABNORMAL HIGH (ref 0.61–1.24)
GFR, Estimated: 45 mL/min — ABNORMAL LOW (ref >60)
Glucose, Bld: 235 mg/dL — ABNORMAL HIGH (ref 70–99)
Potassium: 4.3 mmol/L (ref 3.5–5.1)
Sodium: 135 mmol/L (ref 135–145)

## 2021-10-20 LAB — CBC
HCT: 39.9 % (ref 39.0–52.0)
Hemoglobin: 12.9 g/dL — ABNORMAL LOW (ref 13.0–17.0)
MCH: 28.5 pg (ref 26.0–34.0)
MCHC: 32.3 g/dL (ref 30.0–36.0)
MCV: 88.3 fL (ref 80.0–100.0)
Platelets: 239 10*3/uL (ref 150–400)
RBC: 4.52 MIL/uL (ref 4.22–5.81)
RDW: 14.2 % (ref 11.5–15.5)
WBC: 6.1 10*3/uL (ref 4.0–10.5)
nRBC: 0 % (ref 0.0–0.2)

## 2021-10-25 DIAGNOSIS — Z794 Long term (current) use of insulin: Secondary | ICD-10-CM

## 2021-10-25 DIAGNOSIS — E1169 Type 2 diabetes mellitus with other specified complication: Secondary | ICD-10-CM | POA: Diagnosis not present

## 2021-10-25 DIAGNOSIS — N183 Chronic kidney disease, stage 3 unspecified: Secondary | ICD-10-CM | POA: Diagnosis not present

## 2021-10-25 DIAGNOSIS — I129 Hypertensive chronic kidney disease with stage 1 through stage 4 chronic kidney disease, or unspecified chronic kidney disease: Secondary | ICD-10-CM

## 2021-10-25 DIAGNOSIS — E785 Hyperlipidemia, unspecified: Secondary | ICD-10-CM | POA: Diagnosis not present

## 2021-10-26 ENCOUNTER — Observation Stay
Admission: RE | Admit: 2021-10-26 | Discharge: 2021-10-27 | Disposition: A | Discharge status: Home / Self Care | Payer: Medicare HMO | Attending: Internal Medicine | Admitting: Internal Medicine

## 2021-10-26 ENCOUNTER — Encounter
Admission: RE | Disposition: A | Discharge status: Home / Self Care | Payer: Self-pay | Source: Home / Self Care | Attending: Internal Medicine

## 2021-10-26 ENCOUNTER — Other Ambulatory Visit: Payer: Self-pay

## 2021-10-26 ENCOUNTER — Encounter: Payer: Self-pay | Admitting: Internal Medicine

## 2021-10-26 DIAGNOSIS — N1831 Chronic kidney disease, stage 3a: Secondary | ICD-10-CM | POA: Diagnosis not present

## 2021-10-26 DIAGNOSIS — I10 Essential (primary) hypertension: Secondary | ICD-10-CM | POA: Diagnosis present

## 2021-10-26 DIAGNOSIS — Z86711 Personal history of pulmonary embolism: Secondary | ICD-10-CM | POA: Insufficient documentation

## 2021-10-26 DIAGNOSIS — I214 Non-ST elevation (NSTEMI) myocardial infarction: Secondary | ICD-10-CM | POA: Diagnosis not present

## 2021-10-26 DIAGNOSIS — I2510 Atherosclerotic heart disease of native coronary artery without angina pectoris: Secondary | ICD-10-CM | POA: Insufficient documentation

## 2021-10-26 DIAGNOSIS — E1122 Type 2 diabetes mellitus with diabetic chronic kidney disease: Secondary | ICD-10-CM | POA: Diagnosis not present

## 2021-10-26 DIAGNOSIS — I252 Old myocardial infarction: Secondary | ICD-10-CM

## 2021-10-26 DIAGNOSIS — I129 Hypertensive chronic kidney disease with stage 1 through stage 4 chronic kidney disease, or unspecified chronic kidney disease: Secondary | ICD-10-CM | POA: Insufficient documentation

## 2021-10-26 DIAGNOSIS — Z79899 Other long term (current) drug therapy: Secondary | ICD-10-CM | POA: Diagnosis not present

## 2021-10-26 DIAGNOSIS — Z7982 Long term (current) use of aspirin: Secondary | ICD-10-CM | POA: Diagnosis not present

## 2021-10-26 DIAGNOSIS — Z87891 Personal history of nicotine dependence: Secondary | ICD-10-CM | POA: Diagnosis not present

## 2021-10-26 DIAGNOSIS — E1169 Type 2 diabetes mellitus with other specified complication: Secondary | ICD-10-CM | POA: Diagnosis present

## 2021-10-26 DIAGNOSIS — Z955 Presence of coronary angioplasty implant and graft: Secondary | ICD-10-CM | POA: Diagnosis not present

## 2021-10-26 DIAGNOSIS — Z794 Long term (current) use of insulin: Secondary | ICD-10-CM | POA: Diagnosis not present

## 2021-10-26 HISTORY — PX: INTRAVASCULAR ULTRASOUND/IVUS: CATH118244

## 2021-10-26 HISTORY — PX: LEFT HEART CATH AND CORONARY ANGIOGRAPHY: CATH118249

## 2021-10-26 HISTORY — PX: CORONARY STENT INTERVENTION: CATH118234

## 2021-10-26 HISTORY — PX: CORONARY ULTRASOUND/IVUS: CATH118244

## 2021-10-26 LAB — POCT ACTIVATED CLOTTING TIME
Activated Clotting Time: 293 seconds
Activated Clotting Time: 293 seconds
Activated Clotting Time: 305 seconds

## 2021-10-26 LAB — GLUCOSE, CAPILLARY
Glucose-Capillary: 137 mg/dL — ABNORMAL HIGH (ref 70–99)
Glucose-Capillary: 122 mg/dL — ABNORMAL HIGH (ref 70–99)

## 2021-10-26 SURGERY — LEFT HEART CATH AND CORONARY ANGIOGRAPHY
Anesthesia: Moderate Sedation

## 2021-10-26 MED ORDER — SODIUM CHLORIDE 0.9 % IV SOLN: 250.0000 mL | INTRAVENOUS | Status: DC | PRN

## 2021-10-26 MED ORDER — SODIUM CHLORIDE 0.9 % WEIGHT BASED INFUSION
3.0000 mL/kg/h | INTRAVENOUS | Status: DC
  Administered 2021-10-26: 3 mL/kg/h via INTRAVENOUS

## 2021-10-26 MED ORDER — SODIUM CHLORIDE 0.9 % WEIGHT BASED INFUSION
1.0000 mL/kg/h | INTRAVENOUS | Status: DC
  Administered 2021-10-26: 1 mL/kg/h via INTRAVENOUS

## 2021-10-26 MED ORDER — FENTANYL CITRATE (PF) 100 MCG/2ML IJ SOLN
INTRAMUSCULAR | Status: AC
  Filled 2021-10-26: qty 2

## 2021-10-26 MED ORDER — MIDAZOLAM HCL 2 MG/2ML IJ SOLN
INTRAMUSCULAR | Status: AC
  Filled 2021-10-26: qty 2

## 2021-10-26 MED ORDER — SODIUM CHLORIDE 0.9% FLUSH
3.0000 mL | Freq: Two times a day (BID) | INTRAVENOUS | Status: DC
  Administered 2021-10-27: 3 mL via INTRAVENOUS

## 2021-10-26 MED ORDER — SODIUM CHLORIDE 0.9% FLUSH: 3.0000 mL | INTRAVENOUS | Status: DC | PRN

## 2021-10-26 MED ORDER — PANTOPRAZOLE SODIUM 20 MG PO TBEC
20.0000 mg | DELAYED_RELEASE_TABLET | Freq: Two times a day (BID) | ORAL | Status: DC
Start: 2021-10-26 — End: 2021-10-27
  Administered 2021-10-26 – 2021-10-27 (×2): 20 mg via ORAL
  Filled 2021-10-26 (×3): qty 1

## 2021-10-26 MED ORDER — CLOPIDOGREL BISULFATE 75 MG PO TABS
ORAL_TABLET | ORAL | Status: DC | PRN
  Administered 2021-10-26: 300 mg via ORAL

## 2021-10-26 MED ORDER — INSULIN ASPART 100 UNIT/ML IJ SOLN
0.0000 [IU] | Freq: Three times a day (TID) | INTRAMUSCULAR | Status: DC
  Administered 2021-10-26 – 2021-10-27 (×2): 2 [IU] via SUBCUTANEOUS
  Filled 2021-10-26 (×2): qty 1

## 2021-10-26 MED ORDER — CLOPIDOGREL BISULFATE 75 MG PO TABS
ORAL_TABLET | ORAL | Status: AC
  Filled 2021-10-26: qty 4

## 2021-10-26 MED ORDER — VERAPAMIL HCL 2.5 MG/ML IV SOLN
INTRAVENOUS | Status: AC
  Filled 2021-10-26: qty 2

## 2021-10-26 MED ORDER — HEPARIN SODIUM (PORCINE) 5000 UNIT/ML IJ SOLN
5000.0000 [IU] | Freq: Three times a day (TID) | INTRAMUSCULAR | Status: DC
Start: 2021-10-26 — End: 2021-10-27
  Administered 2021-10-27: 5000 [IU] via SUBCUTANEOUS
  Filled 2021-10-26 (×2): qty 1

## 2021-10-26 MED ORDER — HEPARIN SODIUM (PORCINE) 1000 UNIT/ML IJ SOLN
INTRAMUSCULAR | Status: DC | PRN
  Administered 2021-10-26: 5000 [IU] via INTRAVENOUS
  Administered 2021-10-26 (×2): 2000 [IU] via INTRAVENOUS
  Administered 2021-10-26: 6000 [IU] via INTRAVENOUS

## 2021-10-26 MED ORDER — HEPARIN (PORCINE) IN NACL 1000-0.9 UT/500ML-% IV SOLN
INTRAVENOUS | Status: AC
  Filled 2021-10-26: qty 1000

## 2021-10-26 MED ORDER — ALLOPURINOL 100 MG PO TABS
100.0000 mg | ORAL_TABLET | Freq: Every day | ORAL | Status: DC
Start: 2021-10-26 — End: 2021-10-27
  Administered 2021-10-26: 100 mg via ORAL
  Filled 2021-10-26: qty 1

## 2021-10-26 MED ORDER — LISINOPRIL 20 MG PO TABS
40.0000 mg | ORAL_TABLET | Freq: Every day | ORAL | Status: DC
  Administered 2021-10-27: 40 mg via ORAL
  Filled 2021-10-26: qty 2

## 2021-10-26 MED ORDER — SODIUM CHLORIDE 0.9 % IV SOLN: INTRAVENOUS | Status: AC

## 2021-10-26 MED ORDER — HEPARIN SODIUM (PORCINE) 1000 UNIT/ML IJ SOLN
INTRAMUSCULAR | Status: AC
  Filled 2021-10-26: qty 10

## 2021-10-26 MED ORDER — VERAPAMIL HCL 2.5 MG/ML IV SOLN
INTRAVENOUS | Status: DC | PRN
  Administered 2021-10-26 (×2): 2.5 mg via INTRA_ARTERIAL

## 2021-10-26 MED ORDER — HEPARIN (PORCINE) IN NACL 2000-0.9 UNIT/L-% IV SOLN
INTRAVENOUS | Status: DC | PRN
  Administered 2021-10-26: 1000 mL

## 2021-10-26 MED ORDER — IOHEXOL 300 MG/ML  SOLN
INTRAMUSCULAR | Status: DC | PRN
  Administered 2021-10-26: 94 mL

## 2021-10-26 MED ORDER — FENTANYL CITRATE (PF) 100 MCG/2ML IJ SOLN
INTRAMUSCULAR | Status: DC | PRN
  Administered 2021-10-26 (×3): 25 ug via INTRAVENOUS

## 2021-10-26 MED ORDER — MIDAZOLAM HCL 2 MG/2ML IJ SOLN
INTRAMUSCULAR | Status: DC | PRN
  Administered 2021-10-26 (×3): 1 mg via INTRAVENOUS

## 2021-10-26 MED ORDER — ONDANSETRON HCL 4 MG/2ML IJ SOLN: 4.0000 mg | Freq: Four times a day (QID) | INTRAMUSCULAR | Status: DC | PRN

## 2021-10-26 MED ORDER — SEMAGLUTIDE 14 MG PO TABS: 14.0000 mg | ORAL_TABLET | Freq: Every day | ORAL | Status: DC

## 2021-10-26 MED ORDER — CLOPIDOGREL BISULFATE 75 MG PO TABS
75.0000 mg | ORAL_TABLET | Freq: Every day | ORAL | Status: DC
  Administered 2021-10-27: 75 mg via ORAL
  Filled 2021-10-26: qty 1

## 2021-10-26 MED ORDER — ASPIRIN 81 MG PO TBEC
81.0000 mg | DELAYED_RELEASE_TABLET | Freq: Every day | ORAL | Status: DC
Start: 2021-10-27 — End: 2021-10-27
  Administered 2021-10-27: 81 mg via ORAL
  Filled 2021-10-26: qty 1

## 2021-10-26 MED ORDER — NITROGLYCERIN 1 MG/10 ML FOR IR/CATH LAB
INTRA_ARTERIAL | Status: DC | PRN
  Administered 2021-10-26 (×3): 200 ug via INTRACORONARY

## 2021-10-26 MED ORDER — EZETIMIBE 10 MG PO TABS
10.0000 mg | ORAL_TABLET | Freq: Every day | ORAL | Status: DC
  Administered 2021-10-27: 10 mg via ORAL
  Filled 2021-10-26: qty 1

## 2021-10-26 MED ORDER — ACETAMINOPHEN 325 MG PO TABS
650.0000 mg | ORAL_TABLET | ORAL | Status: DC | PRN
  Administered 2021-10-26: 650 mg via ORAL
  Filled 2021-10-26: qty 2

## 2021-10-26 MED ORDER — LIDOCAINE HCL (PF) 1 % IJ SOLN
INTRAMUSCULAR | Status: DC | PRN
  Administered 2021-10-26: 2 mL

## 2021-10-26 MED ORDER — LIDOCAINE HCL 1 % IJ SOLN
INTRAMUSCULAR | Status: AC
  Filled 2021-10-26: qty 20

## 2021-10-26 MED ORDER — SODIUM CHLORIDE 0.9% FLUSH: 3.0000 mL | Freq: Two times a day (BID) | INTRAVENOUS | Status: DC

## 2021-10-26 MED ORDER — NITROGLYCERIN 0.4 MG SL SUBL
0.4000 mg | SUBLINGUAL_TABLET | SUBLINGUAL | Status: DC | PRN
Start: 2021-10-26 — End: 2021-10-27

## 2021-10-26 MED ORDER — CARVEDILOL 12.5 MG PO TABS
12.5000 mg | ORAL_TABLET | Freq: Two times a day (BID) | ORAL | Status: DC
Start: 2021-10-26 — End: 2021-10-27
  Administered 2021-10-26 – 2021-10-27 (×2): 12.5 mg via ORAL
  Filled 2021-10-26 (×2): qty 1

## 2021-10-26 MED ORDER — INSULIN GLARGINE-YFGN 100 UNIT/ML ~~LOC~~ SOLN
36.0000 [IU] | Freq: Every day | SUBCUTANEOUS | Status: DC
  Administered 2021-10-26: 36 [IU] via SUBCUTANEOUS
  Filled 2021-10-26 (×2): qty 0.36

## 2021-10-26 MED ORDER — ISOSORBIDE MONONITRATE ER 30 MG PO TB24
15.0000 mg | ORAL_TABLET | Freq: Every day | ORAL | Status: DC
  Administered 2021-10-27: 15 mg via ORAL
  Filled 2021-10-26: qty 1

## 2021-10-26 MED ORDER — LABETALOL HCL 5 MG/ML IV SOLN: 10.0000 mg | INTRAVENOUS | Status: AC | PRN

## 2021-10-26 MED ORDER — HYDRALAZINE HCL 20 MG/ML IJ SOLN: 10.0000 mg | INTRAMUSCULAR | Status: AC | PRN

## 2021-10-26 SURGICAL SUPPLY — 26 items
BALLN MINITREK RX 2.0X12 (BALLOONS) ×2
BALLN TREK RX 2.75X12 (BALLOONS) ×2
BALLN ~~LOC~~ TREK NEO RX 2.75X15 (BALLOONS) ×1 IMPLANT
BALLN ~~LOC~~ TREK NEO RX 3.0X15 (BALLOONS) ×2
BALLN ~~LOC~~ TREK NEO RX 3.5X8 (BALLOONS) ×2
BALLOON MINITREK RX 2.0X12 (BALLOONS) IMPLANT
BALLOON TREK RX 2.75X12 (BALLOONS) IMPLANT
BALLOON ~~LOC~~ TREK NEO RX 3.0X15 (BALLOONS) IMPLANT
BALLOON ~~LOC~~ TREK NEO RX 3.5X8 (BALLOONS) IMPLANT
BAND ZEPHYR COMPRESS 30 LONG (HEMOSTASIS) ×1 IMPLANT
CATH 5FR JL3.5 JR4 ANG PIG MP (CATHETERS) ×1 IMPLANT
CATH EAGLE EYE PLAT IMAGING (CATHETERS) ×1 IMPLANT
CATH VISTA GUIDE 6FR XBLAD3.5 (CATHETERS) ×1 IMPLANT
DRAPE BRACHIAL (DRAPES) ×1 IMPLANT
GLIDESHEATH SLEND SS 6F .021 (SHEATH) ×1 IMPLANT
GUIDEWIRE INQWIRE 1.5J.035X260 (WIRE) IMPLANT
INQWIRE 1.5J .035X260CM (WIRE) ×2
KIT ENCORE 26 ADVANTAGE (KITS) ×1 IMPLANT
PACK CARDIAC CATH (CUSTOM PROCEDURE TRAY) ×2 IMPLANT
PROTECTION STATION PRESSURIZED (MISCELLANEOUS) ×2
SET ATX SIMPLICITY (MISCELLANEOUS) ×1 IMPLANT
STATION PROTECTION PRESSURIZED (MISCELLANEOUS) IMPLANT
STENT ONYX FRONTIER 2.75X38 (Permanent Stent) ×1 IMPLANT
STENT ONYX FRONTIER 3.0X15 (Permanent Stent) ×1 IMPLANT
WIRE HITORQ VERSACORE ST 145CM (WIRE) ×1 IMPLANT
WIRE RUNTHROUGH .014X180CM (WIRE) ×1 IMPLANT

## 2021-10-26 NOTE — Interval H&P Note (Signed)
History and Physical Interval Note:  10/26/2021 11:09 AM  [name]  has presented today for surgery, with the diagnosis of NSTEMI.  The various methods of treatment have been discussed with the patient and family. After consideration of risks, benefits and other options for treatment, the patient has consented to  Procedure(s): LEFT HEART CATH AND CORONARY ANGIOGRAPHY (N/A) as a surgical intervention.  The patient's history has been reviewed, patient examined, no change in status, stable for surgery.  I have reviewed the patient's chart and labs.  Questions were answered to the patient's satisfaction.    Cath Lab Visit (complete for each Cath Lab visit)  Clinical Evaluation Leading to the Procedure:   ACS: Yes.    Non-ACS:  N/A   [name]

## 2021-10-27 ENCOUNTER — Encounter: Payer: Self-pay | Admitting: Internal Medicine

## 2021-10-27 ENCOUNTER — Telehealth: Payer: Self-pay | Admitting: *Deleted

## 2021-10-27 DIAGNOSIS — Z79899 Other long term (current) drug therapy: Secondary | ICD-10-CM

## 2021-10-27 DIAGNOSIS — N183 Chronic kidney disease, stage 3 unspecified: Secondary | ICD-10-CM

## 2021-10-27 DIAGNOSIS — Z86711 Personal history of pulmonary embolism: Secondary | ICD-10-CM | POA: Diagnosis not present

## 2021-10-27 DIAGNOSIS — I214 Non-ST elevation (NSTEMI) myocardial infarction: Secondary | ICD-10-CM | POA: Diagnosis not present

## 2021-10-27 DIAGNOSIS — Z955 Presence of coronary angioplasty implant and graft: Secondary | ICD-10-CM | POA: Diagnosis not present

## 2021-10-27 DIAGNOSIS — I129 Hypertensive chronic kidney disease with stage 1 through stage 4 chronic kidney disease, or unspecified chronic kidney disease: Secondary | ICD-10-CM | POA: Diagnosis not present

## 2021-10-27 DIAGNOSIS — I2510 Atherosclerotic heart disease of native coronary artery without angina pectoris: Secondary | ICD-10-CM | POA: Diagnosis not present

## 2021-10-27 DIAGNOSIS — Z7982 Long term (current) use of aspirin: Secondary | ICD-10-CM | POA: Diagnosis not present

## 2021-10-27 DIAGNOSIS — E1122 Type 2 diabetes mellitus with diabetic chronic kidney disease: Secondary | ICD-10-CM | POA: Diagnosis not present

## 2021-10-27 DIAGNOSIS — I10 Essential (primary) hypertension: Secondary | ICD-10-CM

## 2021-10-27 DIAGNOSIS — Z794 Long term (current) use of insulin: Secondary | ICD-10-CM | POA: Diagnosis not present

## 2021-10-27 DIAGNOSIS — I25118 Atherosclerotic heart disease of native coronary artery with other forms of angina pectoris: Secondary | ICD-10-CM

## 2021-10-27 LAB — GLUCOSE, CAPILLARY
Glucose-Capillary: 160 mg/dL — ABNORMAL HIGH (ref 70–99)
Glucose-Capillary: 179 mg/dL — ABNORMAL HIGH (ref 70–99)
Glucose-Capillary: 169 mg/dL — ABNORMAL HIGH (ref 70–99)
Glucose-Capillary: 147 mg/dL — ABNORMAL HIGH (ref 70–99)

## 2021-10-27 LAB — BASIC METABOLIC PANEL
Anion gap: 5 (ref 5–15)
BUN: 20 mg/dL (ref 8–23)
CO2: 23 mmol/L (ref 22–32)
Calcium: 8.9 mg/dL (ref 8.9–10.3)
Chloride: 110 mmol/L (ref 98–111)
Creatinine, Ser: 1.57 mg/dL — ABNORMAL HIGH (ref 0.61–1.24)
GFR, Estimated: 47 mL/min — ABNORMAL LOW (ref >60)
Glucose, Bld: 149 mg/dL — ABNORMAL HIGH (ref 70–99)
Potassium: 4 mmol/L (ref 3.5–5.1)
Sodium: 138 mmol/L (ref 135–145)

## 2021-10-27 LAB — CBC
HCT: 35.2 % — ABNORMAL LOW (ref 39.0–52.0)
Hemoglobin: 11.3 g/dL — ABNORMAL LOW (ref 13.0–17.0)
MCH: 28.3 pg (ref 26.0–34.0)
MCHC: 32.1 g/dL (ref 30.0–36.0)
MCV: 88 fL (ref 80.0–100.0)
Platelets: 211 10*3/uL (ref 150–400)
RBC: 4 MIL/uL — ABNORMAL LOW (ref 4.22–5.81)
RDW: 14.4 % (ref 11.5–15.5)
WBC: 5.4 10*3/uL (ref 4.0–10.5)
nRBC: 0 % (ref 0.0–0.2)

## 2021-10-27 NOTE — Discharge Summary (Signed)
Discharge Summary    Patient ID: [name] MRN: [id number]; DOB: [birth year]  Admit date: 10/26/2021 Discharge date: 10/27/2021  PCP:  [name], DO   [facility] Providers Cardiologist:  [name], MD   {  Discharge Diagnoses    Principal Problem:   NSTEMI (non-ST elevated myocardial infarction) [facility]) Active Problems:   Type 2 diabetes mellitus with other specified complication ([place])   HTN (hypertension)   Non-ST elevation (NSTEMI) myocardial infarction [facility])   CAD S/P percutaneous coronary angioplasty   Stage 3a chronic kidney disease (CKD) ([place])   Diagnostic Studies/Procedures    Cardiac cath 10/26/21 Conclusions: Severe single-vessel coronary artery disease with extensive plaquing of the mid LAD and sequential mid LAD lesions of 80-90%. Mild-moderate, nonobstructive disease involving the distal LCx/OM3 and RCA. Widely patent proximal/mid RCA stent. Mildly elevated left ventricular filling pressure (LVEDP 15-20 mmHg). Successful IVUS-guided PCI to the mid LAD using overlapping Onyx Frontier 3.0 x 15 mm and 2.75 x 38 mm drug-eluting stents with 0% residual stenosis and TIMI-3 flow.   Recommendations: Overnight observation and gentle post catheterization hydration in the setting of chronic kidney disease. Continue dual antiplatelet therapy with aspirin and clopidogrel for at least 12 months. Aggressive secondary prevention of coronary artery disease.   Nelva Bush, MD [facility]  Antiplatelet/Anticoag Recommend uninterrupted dual antiplatelet therapy with Aspirin 81mg  daily and Clopidogrel 75mg  daily for a minimum of 12 months (ACS-Class I recommendation).  Discharge Date In the absence of any other complications or medical issues, we expect the patient to be ready for discharge from an interventional cardiology perspective on 10/27/2021.   Coronary Diagrams  Diagnostic Dominance: Right  Intervention         ____________    History of Present Illness     [name] is a 71 y.o. male with a history of CAD status post prior RCA stenting, diabetes type 2, CKD stage III, hypertension, hyperlipidemia intolerant to statins, prior PE, GERD who presented for outpatient cardiac catheterization.  He was admitted 01/15/2020 with a non-STEMI.  Left heart cath showed severe proximal RCA disease which was treated successfully with PCI/DES.  Echo during admission showed EF 60 to 65% with trivial MR.  More recently he was admitted to the hospital 611 through 615 with E. coli bacteremia felt to be from urinary source as well as non-STEMI.  High sensitive troponin peaked at 1479.  Echo showed LVEF 60 to 65%, no wall motion abnormality, mild LVH chronic grade 2 diastolic dysfunction, normal RV systolic function.  He was treated with IV heparin for 48 hours.  He was seen in the office 10/18/2021 and was doing well from a cardiac perspective he reported intermittent chest tightness.  Stat troponin was drawn which came back to be 6.  Patient was ultimately set up for an outpatient cardiac catheterization.  Hospital Course     Consultants: None  The patient presented for his outpatient cardiac catheterization on 10/26/2021.  Heart cath showed severe single-vessel CAD with extensive plaquing of the mid LAD and sequential mid LAD lesions of 89%, mild to moderate, nonobstructive disease involving the distal left circumflex/OM 3 and RCA, widely patent proximal to mid RCA stent, mildly elevated LVEDP pressure of 15 to 20 mmHg.  He was treated successfully with PCI/DES to the mid LAD.  Patient was admitted overnight for observation and gentle hydration in the setting of CKD.  Patient remained stable overnight.  Postprocedure recommendations for DAPT with aspirin and Plavix for at  least 12 months.  Follow-up labs showed stable creatinine.  Patient ambulated without anginal symptoms.  Cath site, right radial remained stable.  We will continue  all other cardiac medications.  Patient was evaluated by Dr. Azucena Cecil 10/26/21 and felt to be stable for discharge.  Constitutional:      Appearance: He is well-developed.  HENT:     Head: Normocephalic and atraumatic.  Eyes:     General:        Right eye: No discharge.        Left eye: No discharge.  Neck:     Vascular: No JVD.  Cardiovascular:     Rate and Rhythm: Normal rate and regular rhythm.     Pulses:          Posterior tibial pulses are 2+ on the right side and 2+ on the left side.     Heart sounds: Normal heart sounds, S1 normal and S2 normal. Heart sounds not distant. No midsystolic click and no opening snap. No murmur heard.    No friction rub.  Pulmonary:     Effort: Pulmonary effort is normal. No respiratory distress.     Breath sounds: Normal breath sounds. No decreased breath sounds, wheezing or rales.  Chest:     Chest wall: No tenderness.  Abdominal:     General: There is no distension.  Musculoskeletal:     Cervical back: Normal range of motion.     Right lower leg: No edema.     Left lower leg: No edema.  Skin:    General: Skin is warm and dry.     Nails: There is no clubbing.  Neurological:     Mental Status: He is alert and oriented to person, place, and time.  Psychiatric:        Speech: Speech normal.        Behavior: Behavior normal.        Thought Content: Thought content normal.        Judgment: Judgment normal.   Did the patient have an acute coronary syndrome (MI, NSTEMI, STEMI, etc) this admission?:  No                               Did the patient have a percutaneous coronary intervention (stent / angioplasty)?:  Yes.     Cath/PCI Registry Performance & Quality Measures: Aspirin prescribed? - Yes ADP Receptor Inhibitor (Plavix/Clopidogrel, Brilinta/Ticagrelor or Effient/Prasugrel) prescribed (includes medically managed patients)? - Yes High Intensity Statin (Lipitor 40-80mg  or Crestor 20-40mg ) prescribed? - No - intolerance For EF  <40%, was ACEI/ARB prescribed? - Not Applicable (EF >/= 40%) For EF <40%, Aldosterone Antagonist (Spironolactone or Eplerenone) prescribed? - Not Applicable (EF >/= 40%) Cardiac Rehab Phase II ordered? - Yes         _____________  Discharge Vitals Blood pressure 130/85, pulse 66, temperature 98.4 F (36.9 C), temperature source Oral, resp. rate 19, height 5\' 11"  (1.803 m), weight 102.7 kg, SpO2 100 %.  Filed Weights   10/26/21 1649  Weight: 102.7 kg    Labs & Radiologic Studies    CBC Recent Labs    10/27/21 0439  WBC 5.4  HGB 11.3*  HCT 35.2*  MCV 88.0  PLT 211   Basic Metabolic Panel Recent Labs    12/27/21 0439  NA 138  K 4.0  CL 110  CO2 23  GLUCOSE 149*  BUN 20  CREATININE 1.57*  CALCIUM  8.9   Liver Function Tests No results for input(s): "AST", "ALT", "ALKPHOS", "BILITOT", "PROT", "ALBUMIN" in the last 72 hours. No results for input(s): "LIPASE", "AMYLASE" in the last 72 hours. High Sensitivity Troponin:   Recent Labs  Lab 10/18/21 0930  TROPONINIHS 6    BNP Invalid input(s): "POCBNP" D-Dimer No results for input(s): "DDIMER" in the last 72 hours. Hemoglobin A1C No results for input(s): "HGBA1C" in the last 72 hours. Fasting Lipid Panel No results for input(s): "CHOL", "HDL", "LDLCALC", "TRIG", "CHOLHDL", "LDLDIRECT" in the last 72 hours. Thyroid Function Tests No results for input(s): "TSH", "T4TOTAL", "T3FREE", "THYROIDAB" in the last 72 hours.  Invalid input(s): "FREET3" _____________  CARDIAC CATHETERIZATION  Result Date: 10/26/2021 Conclusions: Severe single-vessel coronary artery disease with extensive plaquing of the mid LAD and sequential mid LAD lesions of 80-90%. Mild-moderate, nonobstructive disease involving the distal LCx/OM3 and RCA. Widely patent proximal/mid RCA stent. Mildly elevated left ventricular filling pressure (LVEDP 15-20 mmHg). Successful IVUS-guided PCI to the mid LAD using overlapping Onyx Frontier 3.0 x 15 mm and 2.75  x 38 mm drug-eluting stents with 0% residual stenosis and TIMI-3 flow. Recommendations: Overnight observation and gentle post catheterization hydration in the setting of chronic kidney disease. Continue dual antiplatelet therapy with aspirin and clopidogrel for at least 12 months. Aggressive secondary prevention of coronary artery disease. Yvonne Kendall, MD [facility]   Disposition   Pt is being discharged home today in good condition.  Follow-up Plans & Appointments     Discharge Instructions     AMB Referral to Cardiac Rehabilitation - Phase II   Complete by: As directed    Diagnosis:  NSTEMI Coronary Stents     After initial evaluation and assessments completed: Virtual Based Care may be provided alone or in conjunction with Phase 2 Cardiac Rehab based on patient barriers.: Yes       Discharge Medications   Allergies as of 10/27/2021       Reactions   Crestor [rosuvastatin Calcium] Other (See Comments)   myalgia   Ciprofloxacin    Invokana [canagliflozin] Other (See Comments)   dehydrated   Prednisone Other (See Comments)   Sulfamethizole         Medication List     TAKE these medications    Accu-Chek SmartView test strip Generic drug: glucose blood TEST BLOOD SUGAR TWICE DAILY   acetaminophen 500 MG tablet Commonly known as: TYLENOL Take 1,000 mg by mouth every 8 (eight) hours as needed.   allopurinol 100 MG tablet Commonly known as: ZYLOPRIM TAKE 1 TABLET AT BEDTIME   aspirin EC 81 MG tablet Take 1 tablet (81 mg total) by mouth daily. Swallow whole.   [name] KWIKPEN [place] Inject 36 Units into the skin at bedtime.   carvedilol 12.5 MG tablet Commonly known as: COREG TAKE 1 TABLET TWICE DAILY   clopidogrel 75 MG tablet Commonly known as: PLAVIX Take 1 tablet (75 mg total) by mouth daily.   Droplet Pen Needles 32G X 4 MM Misc Generic drug: Insulin Pen Needle USE WITH [name] INSULIN INJECTION DAILY AS INSTRUCTED   ezetimibe 10 MG  tablet Commonly known as: ZETIA TAKE 1 TABLET EVERY DAY   fluticasone 50 MCG/ACT nasal spray Commonly known as: FLONASE USE 2 SPRAYS IN EACH NOSTRIL ONCE DAILY USE FOR 4 TO 6 WEEKS, THEN STOP AND USE SEASONALLY OR AS NEEDED.   isosorbide mononitrate 30 MG 24 hr tablet Commonly known as: IMDUR Take 0.5 tablets (15 mg total) by mouth daily.  lisinopril 40 MG tablet Commonly known as: ZESTRIL TAKE 1 TABLET EVERY DAY   nitroGLYCERIN 0.4 MG SL tablet Commonly known as: NITROSTAT Place 1 tablet (0.4 mg total) under the tongue every 5 (five) minutes as needed for chest pain.   pantoprazole 20 MG tablet Commonly known as: PROTONIX Take 1 tablet (20 mg total) by mouth 2 (two) times daily before a meal.   Rybelsus 14 MG Tabs Generic drug: Semaglutide Take 1 tablet (14 mg total) by mouth daily before breakfast.           Outstanding Labs/Studies   BMET in 1 week  Duration of Discharge Encounter   Greater than 30 minutes including physician time.  Signed, Aryella Besecker Ninfa Meeker, PA-C 10/27/2021, 11:47 AM

## 2021-10-27 NOTE — Telephone Encounter (Signed)
Reviewed the patient's chart.  He is still currently admitted.   He previously had an appointment with Dr. Azucena Cecil scheduled on Monday 11/08/21 @ 10:20 am. - Will leave this appointment as is.   BMP order placed.  Triage will continue to review for discharge status and reach out to the patient to remind him of the BMP needed in 1 week.

## 2021-10-27 NOTE — Telephone Encounter (Signed)
-----   Message from Cadence David Stall, PA-C sent at 10/27/2021  8:14 AM EDT ----- Regarding: hospital follow-up PT needs hospital follow-up in 2-3 weeks. BMET in 1 week please.

## 2021-10-27 NOTE — Progress Notes (Signed)
Mobility Specialist - Progress Note      10/27/21 0947  Mobility  Activity Ambulated independently in hallway  Level of Assistance Independent  Assistive Device None  Distance Ambulated (ft) 180 ft  Activity Response Tolerated well  $Mobility charge 1 Mobility   Pt supine upon entry. Pt utilizing RA. Pt independently transferred to EOB and stood up. Pt ambulated one lap around NS. Pt left supine with needs in reach. No complaints.   Zetta Bills Mobility Specialist 10/27/21 9:49 AM

## 2021-10-29 NOTE — Telephone Encounter (Signed)
Spoke w/ pt's wife.  She reports that pt is sleeping now, he has been sore since being discharged.  Advised her of Cadence's recommendation.  [facility] and keep appt on 8/14. Asked her to call back w/ any questions or concerns.  She is appreciative of the call.

## 2021-11-01 ENCOUNTER — Other Ambulatory Visit
Admission: RE | Admit: 2021-11-01 | Discharge: 2021-11-01 | Disposition: A | Discharge status: Home / Self Care | Payer: Medicare HMO | Attending: Medical | Admitting: Medical

## 2021-11-01 DIAGNOSIS — Z79899 Other long term (current) drug therapy: Secondary | ICD-10-CM | POA: Insufficient documentation

## 2021-11-01 DIAGNOSIS — N183 Chronic kidney disease, stage 3 unspecified: Secondary | ICD-10-CM | POA: Insufficient documentation

## 2021-11-01 DIAGNOSIS — I25118 Atherosclerotic heart disease of native coronary artery with other forms of angina pectoris: Secondary | ICD-10-CM | POA: Diagnosis not present

## 2021-11-01 DIAGNOSIS — I10 Essential (primary) hypertension: Secondary | ICD-10-CM | POA: Insufficient documentation

## 2021-11-01 LAB — BASIC METABOLIC PANEL
Anion gap: 8 (ref 5–15)
BUN: 18 mg/dL (ref 8–23)
CO2: 22 mmol/L (ref 22–32)
Calcium: 9.1 mg/dL (ref 8.9–10.3)
Chloride: 109 mmol/L (ref 98–111)
Creatinine, Ser: 1.8 mg/dL — ABNORMAL HIGH (ref 0.61–1.24)
GFR, Estimated: 40 mL/min — ABNORMAL LOW (ref >60)
Glucose, Bld: 219 mg/dL — ABNORMAL HIGH (ref 70–99)
Potassium: 4.1 mmol/L (ref 3.5–5.1)
Sodium: 139 mmol/L (ref 135–145)

## 2021-11-03 ENCOUNTER — Other Ambulatory Visit: Payer: Self-pay | Admitting: *Deleted

## 2021-11-03 ENCOUNTER — Other Ambulatory Visit: Payer: Self-pay | Admitting: Family Medicine

## 2021-11-03 DIAGNOSIS — M1000 Idiopathic gout, unspecified site: Secondary | ICD-10-CM

## 2021-11-03 DIAGNOSIS — I10 Essential (primary) hypertension: Secondary | ICD-10-CM

## 2021-11-03 DIAGNOSIS — I2510 Atherosclerotic heart disease of native coronary artery without angina pectoris: Secondary | ICD-10-CM

## 2021-11-03 DIAGNOSIS — K219 Gastro-esophageal reflux disease without esophagitis: Secondary | ICD-10-CM

## 2021-11-03 MED ORDER — CLOPIDOGREL BISULFATE 75 MG PO TABS: 75.0000 mg | ORAL_TABLET | Freq: Every day | ORAL | 0 refills | Status: DC

## 2021-11-03 MED ORDER — ISOSORBIDE MONONITRATE ER 30 MG PO TB24: 15.0000 mg | ORAL_TABLET | Freq: Every day | ORAL | 0 refills | Status: DC

## 2021-11-03 NOTE — Telephone Encounter (Signed)
Requested Prescriptions  Pending Prescriptions Disp Refills  . carvedilol (COREG) 12.5 MG tablet [Pharmacy Med Name: CARVEDILOL 12.5 MG Tablet] 180 tablet 3    Sig: TAKE 1 TABLET TWICE DAILY     Cardiovascular: Beta Blockers 3 Failed - 11/03/2021  3:45 AM      Failed - Cr in normal range and within 360 days    Creat  Date Value Ref Range Status  11/03/2020 1.57 (H) 0.70 - 1.35 mg/dL Final   Creatinine, Ser  Date Value Ref Range Status  11/01/2021 1.80 (H) 0.61 - 1.24 mg/dL Final   Creatinine, Urine  Date Value Ref Range Status  08/12/2021 133 20 - 320 mg/dL Final         Passed - AST in normal range and within 360 days    AST  Date Value Ref Range Status  09/05/2021 31 15 - 41 U/L Final         Passed - ALT in normal range and within 360 days    ALT  Date Value Ref Range Status  09/05/2021 20 0 - 44 U/L Final         Passed - Last BP in normal range    BP Readings from Last 1 Encounters:  10/27/21 126/73         Passed - Last Heart Rate in normal range    Pulse Readings from Last 1 Encounters:  10/27/21 71         Passed - Valid encounter within last 6 months    Recent Outpatient Visits          1 month ago E coli bacteremia   [place], DO   2 months ago Type 2 diabetes mellitus with other specified complication, with long-term current use of insulin ([place])   [facility], [name], DO   8 months ago Type 2 diabetes mellitus with other specified complication, with long-term current use of insulin [facility])   [facility] [name], DO   11 months ago Annual physical exam   [place], [name], DO   1 year ago Type 2 diabetes mellitus with other specified complication, with long-term current use of insulin ([place])   [place], [name], DO      Future Appointments            In 5 days Agbor-Etang,  Aaron Edelman, MD [facility], [facility]   In 3 months  [facility],    In 3 months [name], [name], DO [facility], [place]           . lisinopril (ZESTRIL) 40 MG tablet [Pharmacy Med Name: LISINOPRIL 40 MG Tablet] 90 tablet 3    Sig: TAKE 1 TABLET EVERY DAY     Cardiovascular:  ACE Inhibitors Failed - 11/03/2021  3:45 AM      Failed - Cr in normal range and within 180 days    Creat  Date Value Ref Range Status  11/03/2020 1.57 (H) 0.70 - 1.35 mg/dL Final   Creatinine, Ser  Date Value Ref Range Status  11/01/2021 1.80 (H) 0.61 - 1.24 mg/dL Final   Creatinine, Urine  Date Value Ref Range Status  08/12/2021 133 20 - 320 mg/dL Final         Passed - K in normal range and within 180 days    Potassium  Date Value Ref  Range Status  11/01/2021 4.1 3.5 - 5.1 mmol/L Final  10/21/2011 5.0 3.5 - 5.1 mmol/L Final         Passed - Patient is not pregnant      Passed - Last BP in normal range    BP Readings from Last 1 Encounters:  10/27/21 126/73         Passed - Valid encounter within last 6 months    Recent Outpatient Visits          1 month ago E coli bacteremia   [place], DO   2 months ago Type 2 diabetes mellitus with other specified complication, with long-term current use of insulin [facility])   [facility], [name], DO   8 months ago Type 2 diabetes mellitus with other specified complication, with long-term current use of insulin [facility])   [facility] [name], DO   11 months ago Annual physical exam   [facility] [place], [name], DO   1 year ago Type 2 diabetes mellitus with other specified complication, with long-term current use of insulin [facility])   [place], [name], DO      Future Appointments            In 5 days Agbor-Etang, Aaron Edelman, MD [facility], [facility]   In 3 months  [facility], [place]   In 3 months [name], [name], DO [facility], [place]           . allopurinol (ZYLOPRIM) 100 MG tablet [Pharmacy Med Name: ALLOPURINOL 100 MG Tablet] 90 tablet 3    Sig: TAKE 1 TABLET AT BEDTIME     Endocrinology:  Gout Agents - allopurinol Failed - 11/03/2021  3:45 AM      Failed - Uric Acid in normal range and within 360 days    Uric Acid, Serum  Date Value Ref Range Status  11/03/2020 6.6 4.0 - 8.0 mg/dL Final    Comment:    Therapeutic target for gout patients: <6.0 mg/dL .    Uric Acid  Date Value Ref Range Status  11/12/2019 6.6 3.8 - 8.4 mg/dL Final    Comment:               Therapeutic target for gout patients: <6.0         Failed - Cr in normal range and within 360 days    Creat  Date Value Ref Range Status  11/03/2020 1.57 (H) 0.70 - 1.35 mg/dL Final   Creatinine, Ser  Date Value Ref Range Status  11/01/2021 1.80 (H) 0.61 - 1.24 mg/dL Final   Creatinine, Urine  Date Value Ref Range Status  08/12/2021 133 20 - 320 mg/dL Final         Failed - CBC within normal limits and completed in the last 12 months    WBC  Date Value Ref Range Status  10/27/2021 5.4 4.0 - 10.5 K/uL Final   RBC  Date Value Ref Range Status  10/27/2021 4.00 (L) 4.22 - 5.81 MIL/uL Final   Hemoglobin  Date Value Ref Range Status  10/27/2021 11.3 (L) 13.0 - 17.0 g/dL Final  11/12/2019 13.4 13.0 - 17.7 g/dL Final   HCT  Date Value Ref Range Status  10/27/2021 35.2 (L) 39.0 - 52.0 % Final   Hematocrit  Date Value Ref Range Status  11/12/2019 42.4 37.5 - 51.0 %  Final   MCHC  Date Value Ref Range Status  10/27/2021 32.1 30.0 - 36.0 g/dL Final   [facility]  Date Value Ref Range Status  10/27/2021 28.3 26.0 - 34.0 pg Final   MCV  Date Value Ref Range Status  10/27/2021 88.0 80.0 - 100.0 fL Final  11/12/2019 91 79 - 97 fL Final  10/21/2011 90 80 - 100 fL Final   No results found for:  "PLTCOUNTKUC", "LABPLAT", "POCPLA" RDW  Date Value Ref Range Status  10/27/2021 14.4 11.5 - 15.5 % Final  11/12/2019 13.8 11.6 - 15.4 % Final  10/21/2011 13.9 11.5 - 14.5 % Final         Passed - Valid encounter within last 12 months    Recent Outpatient Visits          1 month ago E coli bacteremia   [facility] [place], Netta Neat, DO   2 months ago Type 2 diabetes mellitus with other specified complication, with long-term current use of insulin (H[facility])   [facility], Netta Neat, DO   8 months ago Type 2 diabetes mellitus with other specified complication, with long-term current use of insulin [facility])   [facility] [name], DO   11 months ago Annual physical exam   [facility] [place], Netta Neat, DO   1 year ago Type 2 diabetes mellitus with other specified complication, with long-term current use of insulin (HCC)   [facility] Althea Charon, Netta Neat, DO      Future Appointments            In 5 days Agbor-Etang, Arlys John, MD [facility], [facility]   In 3 months  [facility]   In 3 months Al[place] Charon, Netta Neat, DO [facility], [facility]           . pantoprazole (PROTONIX) 20 MG tablet [Pharmacy Med Name: PANTOPRAZOLE SODIUM 20 MG Tablet Delayed Release] 180 tablet     Sig: TAKE 1 TABLET TWICE DAILY BEFORE MEALS (STOP OMEPRAZOLE)     Gastroenterology: Proton Pump Inhibitors Passed - 11/03/2021  3:45 AM      Passed - Valid encounter within last 12 months    Recent Outpatient Visits          1 month ago E coli bacteremia   [facility] [place], Netta Neat, DO   2 months ago Type 2 diabetes mellitus with other specified complication, with long-term current use of insulin (HCC)   [facility], Netta Neat, DO   8 months ago Type 2 diabetes mellitus with other  specified complication, with long-term current use of insulin [facility])   [facility] [name], DO   11 months ago Annual physical exam   [facility] [place], Netta Neat, DO   1 year ago Type 2 diabetes mellitus with other specified complication, with long-term current use of insulin [facility])   [facility] Althea Charon, Netta Neat, DO      Future Appointments            In 5 days Agbor-Etang, Arlys John, MD [facility], [facility]   In 3 months  [facility], [facility]   In 3 months Althea Charon, Netta Neat, DO [facility], [facility]           3  medications still have 90 day supply. Pantoprazole has 7  months of medication remaining.

## 2021-11-08 ENCOUNTER — Ambulatory Visit: Payer: Medicare HMO | Admitting: Cardiology

## 2021-11-08 ENCOUNTER — Encounter: Payer: Self-pay | Admitting: Cardiology

## 2021-11-08 VITALS — BP 130/76 | HR 72 | Ht 71.0 in | Wt 231.0 lb

## 2021-11-08 DIAGNOSIS — I214 Non-ST elevation (NSTEMI) myocardial infarction: Secondary | ICD-10-CM | POA: Diagnosis not present

## 2021-11-08 DIAGNOSIS — I10 Essential (primary) hypertension: Secondary | ICD-10-CM

## 2021-11-08 DIAGNOSIS — E7800 Pure hypercholesterolemia, unspecified: Secondary | ICD-10-CM | POA: Diagnosis not present

## 2021-11-08 NOTE — Progress Notes (Signed)
Cardiology Office Note:    Date:  11/08/2021   ID:  [name], DOB 01-20-1951, MRN [id number]  PCP:  [name], DO  Cardiologist:  [name], MD  Electrophysiologist:  None   Referring MD: Nobie Putnam *   Chief Complaint  Patient presents with   Follow-up    F/U after cardiac cath; Patient states he is still experiencing chest discomfort after having stents placed.     History of Present Illness:    [name] is a 71 y.o. male with a hx of CAD/NSTEMI (PCI/DES to mLAD 10/2021, RCA 12/2019), hypertension, hyperlipidemia, CKD-3, GERD, PE , chews tobacco who presents for follow-up.    Recently seen in the hospital 10/2021 due to chest pain, diagnosed with NSTEMI, left heart cath showed significant mid LAD stenosis, drug-eluting stent was placed.  Denies chest pain, tolerating aspirin, Plavix.  He has some right-sided chest pain, reproducible with palpation, not associated with exertion.  Seems to be improving slowly.   Prior notes Lexiscan Myoview 05/11/2021 fixed apical defect, no evidence for ischemia.  Low risk study Echocardiogram 12/2019, normal ejection fraction 60 to 65%. Left heart cath 12/30/2019 proximal RCA 90%, proximal LAD 40%, moderate left circumflex disease.  Status post successful angioplasty and drug-eluting stent placement to proximal RCA. Easy bruisability with Brilinta  Past Medical History:  Diagnosis Date   Allergy    CAD (coronary artery disease)    a. 01/2019 MV: EF 55-65%, no ischemia; b. 12/2019 NSTEMI/PCI: LM nl, LAD 40p, 45m, D1 40, LCX nl, OM3 40, RCA 90/85p (2.75x34 Resolute Onyx DES), 82m/d.   Chronic headaches    CKD (chronic kidney disease), stage III (HCC)    Edentulous    no lower teeth   GERD (gastroesophageal reflux disease)    Gout    Heart burn    History of echocardiogram    a. 12/2019 Echo: EF 60-65%, no rwma, triv MR.   Hyperlipidemia    Hypertension    Motion sickness    boats    NSTEMI (non-ST elevated myocardial infarction) ([place]) 12/27/2019   Pulmonary embolism ([place]) 05/2017   Type 2 diabetes mellitus [facility])     Past Surgical History:  Procedure Laterality Date   BACK SURGERY  02/26/2019   CATARACT EXTRACTION W/PHACO Left 04/14/2021   Procedure: CATARACT EXTRACTION PHACO AND INTRAOCULAR LENS PLACEMENT ([place]) LEFT DIABETIC maylugin;  Surgeon: Leandrew Koyanagi, MD;  Location: [place];  Service: Ophthalmology;  Laterality: Left;  11.22 1:25.1   CATARACT EXTRACTION W/PHACO Right 06/09/2021   Procedure: CATARACT EXTRACTION PHACO AND INTRAOCULAR LENS PLACEMENT ([place]) RIGHT DIABETIC MALYUGIN;  Surgeon: Leandrew Koyanagi, MD;  Location: [place];  Service: Ophthalmology;  Laterality: Right;  Diabetic 4.75 01:05.7   CORONARY STENT INTERVENTION N/A 10/26/2021   Procedure: CORONARY STENT INTERVENTION;  Surgeon: Nelva Bush, MD;  Location: [place] [facility];  Service: Cardiovascular;  Laterality: N/A;   INTRAVASCULAR ULTRASOUND/IVUS N/A 10/26/2021   Procedure: Intravascular Ultrasound/IVUS;  Surgeon: Nelva Bush, MD;  Location: [place] [facility];  Service: Cardiovascular;  Laterality: N/A;   KNEE SURGERY     LEFT HEART CATH AND CORONARY ANGIOGRAPHY N/A 12/30/2019   Procedure: LEFT HEART CATH AND CORONARY ANGIOGRAPHY;  Surgeon: Wellington Hampshire, MD;  Location: [place] [facility];  Service: Cardiovascular;  Laterality: N/A;   LEFT HEART CATH AND CORONARY ANGIOGRAPHY N/A 10/26/2021   Procedure: LEFT HEART CATH AND CORONARY ANGIOGRAPHY;  Surgeon: Nelva Bush, MD;  Location: [place] [facility];  Service: Cardiovascular;  Laterality: N/A;   SHOULDER SURGERY     TONSILLECTOMY      Current Medications: Current Meds  Medication Sig   ACCU-CHEK SMARTVIEW test strip TEST BLOOD SUGAR TWICE DAILY   acetaminophen (TYLENOL) 500 MG tablet Take 1,000 mg by mouth every 8 (eight) hours as needed.    allopurinol (ZYLOPRIM) 100 MG tablet TAKE 1  TABLET AT BEDTIME   amLODipine (NORVASC) 2.5 MG tablet Take 1 tablet by mouth daily.   aspirin EC 81 MG EC tablet Take 1 tablet (81 mg total) by mouth daily. Swallow whole.   carvedilol (COREG) 12.5 MG tablet TAKE 1 TABLET TWICE DAILY   clopidogrel (PLAVIX) 75 MG tablet Take 1 tablet (75 mg total) by mouth daily.   DROPLET PEN NEEDLES 32G X 4 MM MISC USE WITH [name] INSULIN INJECTION DAILY AS INSTRUCTED   ezetimibe (ZETIA) 10 MG tablet TAKE 1 TABLET EVERY DAY   fluticasone (FLONASE) 50 MCG/ACT nasal spray USE 2 SPRAYS IN EACH NOSTRIL ONCE DAILY USE FOR 4 TO 6 WEEKS, THEN STOP AND USE SEASONALLY OR AS NEEDED.   Insulin Glargine ([name] KWIKPEN [place]) Inject 36 Units into the skin at bedtime.   isosorbide mononitrate (IMDUR) 30 MG 24 hr tablet Take 0.5 tablets (15 mg total) by mouth daily.   lisinopril (ZESTRIL) 40 MG tablet TAKE 1 TABLET EVERY DAY   nitroGLYCERIN (NITROSTAT) 0.4 MG SL tablet Place 1 tablet (0.4 mg total) under the tongue every 5 (five) minutes as needed for chest pain.   pantoprazole (PROTONIX) 20 MG tablet Take 1 tablet (20 mg total) by mouth 2 (two) times daily before a meal.   RYBELSUS 14 MG TABS Take 1 tablet (14 mg total) by mouth daily before breakfast.     Allergies:   Crestor [rosuvastatin calcium], Ciprofloxacin, Invokana [canagliflozin], Prednisone, and Sulfamethizole   Social History   Socioeconomic History   Marital status: Married    Spouse name: Not on file   Number of children: Not on file   Years of education: Not on file   Highest education level: Not on file  Occupational History   Occupation: retired  Tobacco Use   Smoking status: Former    Types: Cigars    Quit date: 2000    Years since quitting: 23.6   Smokeless tobacco: Current    Types: Chew   Tobacco comments:    wife states patient never smoked cigarettes, just chewing tobacco  Vaping Use   Vaping Use: Never used  Substance and Sexual Activity   Alcohol use: Not Currently     Alcohol/week: 0.0 standard drinks of alcohol   Drug use: No   Sexual activity: Yes    Birth control/protection: Inserts  Other Topics Concern   Not on file  Social History Narrative   Not on file   Social Determinants of Health   Financial Resource Strain: Low Risk  (01/26/2021)   Overall Financial Resource Strain (CARDIA)    Difficulty of Paying Living Expenses: Not hard at all  Food Insecurity: No Food Insecurity (01/26/2021)   Hunger Vital Sign    Worried About Running Out of Food in the Last Year: Never true    Ran Out of Food in the Last Year: Never true  Transportation Needs: No Transportation Needs (01/26/2021)   PRAPARE - Administrator, Civil Service (Medical): No    Lack of Transportation (Non-Medical): No  Physical Activity: Inactive (01/26/2021)   Exercise Vital Sign    Days  of Exercise per Week: 0 days    Minutes of Exercise per Session: 0 min  Stress: No Stress Concern Present (01/26/2021)   [place]    Feeling of Stress : Not at all  Social Connections: Somewhat Isolated (08/29/2017)   Social Connection and Isolation Panel [NHANES]    Frequency of Communication with Friends and Family: More than three times a week    Frequency of Social Gatherings with Friends and Family: More than three times a week    Attends Religious Services: Never    Marine scientist or Organizations: No    Attends Music therapist: Never    Marital Status: Married     Family History: The patient's family history includes Cancer in his father; Heart disease in his brother and sister. There is no history of Prostate cancer.  ROS:   Please see the history of present illness.     All other systems reviewed and are negative.  EKGs/Labs/Other Studies Reviewed:    The following studies were reviewed today:   EKG:  EKG is ordered today.  EKG shows normal sinus rhythm, first-degree AV  block.  Recent Labs: 09/05/2021: ALT 20; B Natriuretic Peptide 71.6 09/09/2021: Magnesium 2.2 10/27/2021: Hemoglobin 11.3; Platelets 211 11/01/2021: BUN 18; Creatinine, Ser 1.80; Potassium 4.1; Sodium 139  Recent Lipid Panel    Component Value Date/Time   CHOL 153 08/05/2021 0818   CHOL 212 (H) 11/12/2019 0829   TRIG 198 (H) 08/05/2021 0818   HDL 32 (L) 08/05/2021 0818   HDL 32 (L) 11/12/2019 0829   CHOLHDL 4.8 08/05/2021 0818   VLDL 37 12/29/2019 0026   LDLCALC 91 08/05/2021 0818    Physical Exam:    VS:  BP 130/76 (BP Location: Left Arm, Patient Position: Sitting, Cuff Size: Large)   Pulse 72   Ht 5\' 11"  (1.803 m)   Wt 231 lb (104.8 kg)   SpO2 98%   BMI 32.22 kg/m     Wt Readings from Last 3 Encounters:  11/08/21 231 lb (104.8 kg)  10/26/21 226 lb 6.6 oz (102.7 kg)  10/18/21 228 lb 8 oz (103.6 kg)     GEN:  Well nourished, well developed in no acute distress HEENT: Normal NECK: No JVD; No carotid bruits CARDIAC: RRR, no murmurs, rubs, gallops RESPIRATORY:  Clear to auscultation without rales, wheezing or rhonchi  ABDOMEN: Soft, non-tender, non-distended MUSCULOSKELETAL:  No edema; right chest wall tenderness reproducible with palpation SKIN: Warm and dry NEUROLOGIC:  Alert and oriented x 3 PSYCHIATRIC:  Normal affect   ASSESSMENT:    1. NSTEMI (non-ST elevated myocardial infarction) ([place])   2. Primary hypertension   3. Pure hypercholesterolemia    PLAN:    CAD/NSTEMI s/p PCI (to mLAD 10/2021, PCI to RCA 12/2019).  Has right-sided chest pain reproducible with palpation, indicating musculoskeletal etiology.  Continue aspirin, Plavix, Imdur 15 mg daily.  echo with EF 60%. hypertension, BP controlled.  Continue Coreg, lisinopril, Imdur hyperlipidemia, LDL at goal, continue Zetia.  History of myalgias with statins.  Follow-up in 6-12 months  Total encounter time 40 minutes  Greater than 50% was spent in counseling and coordination of care with the  patient   Medication Adjustments/Labs and Tests Ordered: Current medicines are reviewed at length with the patient today.  Concerns regarding medicines are outlined above.  Orders Placed This Encounter  Procedures   EKG 12-Lead    No orders of the defined types  were placed in this encounter.    Patient Instructions  Medication Instructions:   Your physician recommends that you continue on your current medications as directed. Please refer to the Current Medication list given to you today.  *If you need a refill on your cardiac medications before your next appointment, please call your pharmacy*    Follow-Up: At [facility], you and your health needs are our priority.  As part of our continuing mission to provide you with exceptional heart care, we have created designated Provider Care Teams.  These Care Teams include your primary Cardiologist (physician) and Advanced Practice Providers (APPs -  Physician Assistants and Nurse Practitioners) who all work together to provide you with the care you need, when you need it.  We recommend signing up for the patient portal called "MyChart".  Sign up information is provided on this After Visit Summary.  MyChart is used to connect with patients for Virtual Visits (Telemedicine).  Patients are able to view lab/test results, encounter notes, upcoming appointments, etc.  Non-urgent messages can be sent to your provider as well.   To learn more about what you can do with MyChart, go to [email or web address].    Your next appointment:    12 months   The format for your next appointment:   In Person  Provider:   Debbe Odea, MD    Other Instructions   Important Information About Sugar         Signed, Debbe Odea, MD  11/08/2021 11:04 AM    [facility]

## 2021-11-08 NOTE — Patient Instructions (Addendum)
Medication Instructions:   Your physician recommends that you continue on your current medications as directed. Please refer to the Current Medication list given to you today.  *If you need a refill on your cardiac medications before your next appointment, please call your pharmacy*    Follow-Up: At [facility], you and your health needs are our priority.  As part of our continuing mission to provide you with exceptional heart care, we have created designated Provider Care Teams.  These Care Teams include your primary Cardiologist (physician) and Advanced Practice Providers (APPs -  Physician Assistants and Nurse Practitioners) who all work together to provide you with the care you need, when you need it.  We recommend signing up for the patient portal called "MyChart".  Sign up information is provided on this After Visit Summary.  MyChart is used to connect with patients for Virtual Visits (Telemedicine).  Patients are able to view lab/test results, encounter notes, upcoming appointments, etc.  Non-urgent messages can be sent to your provider as well.   To learn more about what you can do with MyChart, go to [email or web address].    Your next appointment:    12 months   The format for your next appointment:   In Person  Provider:   Debbe Odea, MD    Other Instructions   Important Information About Sugar

## 2021-11-23 ENCOUNTER — Other Ambulatory Visit (INDEPENDENT_AMBULATORY_CARE_PROVIDER_SITE_OTHER): Payer: Medicare HMO

## 2021-11-23 DIAGNOSIS — N3000 Acute cystitis without hematuria: Secondary | ICD-10-CM | POA: Diagnosis not present

## 2021-11-23 LAB — POCT URINALYSIS DIPSTICK
Bilirubin, UA: NEGATIVE
Glucose, UA: POSITIVE — AB
Ketones, UA: NEGATIVE
Nitrite, UA: POSITIVE
Protein, UA: NEGATIVE
Spec Grav, UA: 1.02 (ref 1.010–1.025)
Urobilinogen, UA: 0.2 E.U./dL
pH, UA: 5 (ref 5.0–8.0)

## 2021-11-24 ENCOUNTER — Encounter: Payer: Self-pay | Admitting: Internal Medicine

## 2021-11-24 ENCOUNTER — Ambulatory Visit (INDEPENDENT_AMBULATORY_CARE_PROVIDER_SITE_OTHER): Payer: Medicare HMO | Admitting: Internal Medicine

## 2021-11-24 VITALS — BP 146/76 | HR 96 | Temp 100.3°F | Wt 231.0 lb

## 2021-11-24 DIAGNOSIS — R300 Dysuria: Secondary | ICD-10-CM

## 2021-11-24 DIAGNOSIS — N3000 Acute cystitis without hematuria: Secondary | ICD-10-CM

## 2021-11-24 MED ORDER — NITROFURANTOIN MONOHYD MACRO 100 MG PO CAPS: 100.0000 mg | ORAL_CAPSULE | Freq: Two times a day (BID) | ORAL | 0 refills | Status: DC

## 2021-11-24 NOTE — Patient Instructions (Signed)
Urinary Tract Infection, Adult A urinary tract infection (UTI) is an infection of any part of the urinary tract. The urinary tract includes: The kidneys. The ureters. The bladder. The urethra. These organs make, store, and get rid of pee (urine) in the body. What are the causes? This infection is caused by germs (bacteria) in your genital area. These germs grow and cause swelling (inflammation) of your urinary tract. What increases the risk? The following factors may make you more likely to develop this condition: Using a small, thin tube (catheter) to drain pee. Not being able to control when you pee or poop (incontinence). Being male. If you are male, these things can increase the risk: Using these methods to prevent pregnancy: A medicine that kills sperm (spermicide). A device that blocks sperm (diaphragm). Having low levels of a male hormone (estrogen). Being pregnant. You are more likely to develop this condition if: You have genes that add to your risk. You are sexually active. You take antibiotic medicines. You have trouble peeing because of: A prostate that is bigger than normal, if you are male. A blockage in the part of your body that drains pee from the bladder. A kidney stone. A nerve condition that affects your bladder. Not getting enough to drink. Not peeing often enough. You have other conditions, such as: Diabetes. A weak disease-fighting system (immune system). Sickle cell disease. Gout. Injury of the spine. What are the signs or symptoms? Symptoms of this condition include: Needing to pee right away. Peeing small amounts often. Pain or burning when peeing. Blood in the pee. Pee that smells bad or not like normal. Trouble peeing. Pee that is cloudy. Fluid coming from the vagina, if you are male. Pain in the belly or lower back. Other symptoms include: Vomiting. Not feeling hungry. Feeling mixed up (confused). This may be the first symptom in  older adults. Being tired and grouchy (irritable). A fever. Watery poop (diarrhea). How is this treated? Taking antibiotic medicine. Taking other medicines. Drinking enough water. In some cases, you may need to see a specialist. Follow these instructions at home:  Medicines Take over-the-counter and prescription medicines only as told by your doctor. If you were prescribed an antibiotic medicine, take it as told by your doctor. Do not stop taking it even if you start to feel better. General instructions Make sure you: Pee until your bladder is empty. Do not hold pee for a long time. Empty your bladder after sex. Wipe from front to back after peeing or pooping if you are a male. Use each tissue one time when you wipe. Drink enough fluid to keep your pee pale yellow. Keep all follow-up visits. Contact a doctor if: You do not get better after 1-2 days. Your symptoms go away and then come back. Get help right away if: You have very bad back pain. You have very bad pain in your lower belly. You have a fever. You have chills. You feeling like you will vomit or you vomit. Summary A urinary tract infection (UTI) is an infection of any part of the urinary tract. This condition is caused by germs in your genital area. There are many risk factors for a UTI. Treatment includes antibiotic medicines. Drink enough fluid to keep your pee pale yellow. This information is not intended to replace advice given to you by your health care provider. Make sure you discuss any questions you have with your health care provider. Document Revised: 10/25/2019 Document Reviewed: 10/25/2019 Elsevier Patient Education    2023 Elsevier Inc.  

## 2021-11-24 NOTE — Progress Notes (Signed)
HPI  Pt presents to the clinic today with c/o urinary retention, dysuria, nausea and chills. He report this started yesterday morning. He denies urgency, frequency or blood in his urine. He has some intermittent low back pain but denies abdominal pain, vomiting, fever, or vomiting. He has taken AZO OTC with some relief of symptoms. He was admitted 08/2021 with urosepesis secondary to [facility] UTI.   Review of Systems  Past Medical History:  Diagnosis Date   Allergy    CAD (coronary artery disease)    a. 01/2019 MV: EF 55-65%, no ischemia; b. 12/2019 NSTEMI/PCI: LM nl, LAD 40p, 61m, D1 40, LCX nl, OM3 40, RCA 90/85p (2.75x34 Resolute Onyx DES), 58m/d.   Chronic headaches    CKD (chronic kidney disease), stage III (HCC)    Edentulous    no lower teeth   GERD (gastroesophageal reflux disease)    Gout    Heart burn    History of echocardiogram    a. 12/2019 Echo: EF 60-65%, no rwma, triv MR.   Hyperlipidemia    Hypertension    Motion sickness    boats   NSTEMI (non-ST elevated myocardial infarction) (HCC) 12/27/2019   Pulmonary embolism (HCC) 05/2017   Type 2 diabetes mellitus (HCC)     Family History  Problem Relation Age of Onset   Cancer Father        lung   Heart disease Sister    Heart disease Brother    Prostate cancer Neg Hx     Social History   Socioeconomic History   Marital status: Married    Spouse name: Not on file   Number of children: Not on file   Years of education: Not on file   Highest education level: Not on file  Occupational History   Occupation: retired  Tobacco Use   Smoking status: Former    Types: Cigars    Quit date: 2000    Years since quitting: 23.6   Smokeless tobacco: Current    Types: Chew   Tobacco comments:    wife states patient never smoked cigarettes, just chewing tobacco  Vaping Use   Vaping Use: Never used  Substance and Sexual Activity   Alcohol use: Not Currently    Alcohol/week: 0.0 standard drinks of alcohol   Drug use:  No   Sexual activity: Yes    Birth control/protection: Inserts  Other Topics Concern   Not on file  Social History Narrative   Not on file   Social Determinants of Health   Financial Resource Strain: Low Risk  (01/26/2021)   Overall Financial Resource Strain (CARDIA)    Difficulty of Paying Living Expenses: Not hard at all  Food Insecurity: No Food Insecurity (01/26/2021)   Hunger Vital Sign    Worried About Running Out of Food in the Last Year: Never true    Ran Out of Food in the Last Year: Never true  Transportation Needs: No Transportation Needs (01/26/2021)   PRAPARE - Administrator, Civil Service (Medical): No    Lack of Transportation (Non-Medical): No  Physical Activity: Inactive (01/26/2021)   Exercise Vital Sign    Days of Exercise per Week: 0 days    Minutes of Exercise per Session: 0 min  Stress: No Stress Concern Present (01/26/2021)   [employer] of Occupational Health - Occupational Stress Questionnaire    Feeling of Stress : Not at all  Social Connections: Somewhat Isolated (08/29/2017)   Social Connection and Isolation Panel [NHANES]  Frequency of Communication with Friends and Family: More than three times a week    Frequency of Social Gatherings with Friends and Family: More than three times a week    Attends Religious Services: Never    Database administrator or Organizations: No    Attends Banker Meetings: Never    Marital Status: Married  Catering manager Violence: Not At Risk (08/29/2017)   Humiliation, Afraid, Rape, and Kick questionnaire    Fear of Current or Ex-Partner: No    Emotionally Abused: No    Physically Abused: No    Sexually Abused: No    Allergies  Allergen Reactions   Crestor [Rosuvastatin Calcium] Other (See Comments)    myalgia   Ciprofloxacin    Invokana [Canagliflozin] Other (See Comments)    dehydrated   Prednisone Other (See Comments)   Sulfamethizole      Constitutional: Pt reports chills.  Denies fever, malaise, fatigue, headache or abrupt weight changes.   GU: Pt reports urinary retention and pain with urination. Denies urgency, frequency, burning sensation, blood in urine, odor or discharge. Skin: Denies redness, rashes, lesions or ulcercations.   No other specific complaints in a complete review of systems (except as listed in HPI above).    Objective:   Physical Exam  BP (!) 146/76 (BP Location: Left Arm, Patient Position: Sitting, Cuff Size: Normal)   Pulse 96   Temp 100.3 F (37.9 C) (Oral)   Wt 231 lb (104.8 kg)   SpO2 99%   BMI 32.22 kg/m   Wt Readings from Last 3 Encounters:  11/08/21 231 lb (104.8 kg)  10/26/21 226 lb 6.6 oz (102.7 kg)  10/18/21 228 lb 8 oz (103.6 kg)    General: Appears his stated age, obese, in NAD. Cardiovascular: Normal rate and rhythm. S1,S2 noted.   Pulmonary/Chest: Normal effort and positive vesicular breath sounds. No respiratory distress. No wheezes, rales or ronchi noted.  Abdomen: Soft and nontender. Normal bowel sounds. No distention or masses noted. No CVA tenderness.        Assessment & Plan:   Urinary Retention, Dysuria:  Urinalysis: 1+ leuks, 1+ blood, positive nitrites Will send urine culture eRx sent if for Macrobid 100 mg BID x 7 days OK to take AZO OTC Drink plenty of fluids  RTC as needed or if symptoms persist. Nicki Reaper, NP

## 2021-11-25 ENCOUNTER — Encounter: Payer: Self-pay | Admitting: Physician Assistant

## 2021-11-25 ENCOUNTER — Encounter: Payer: Self-pay | Admitting: Internal Medicine

## 2021-11-25 ENCOUNTER — Ambulatory Visit
Admission: RE | Admit: 2021-11-25 | Discharge: 2021-11-25 | Disposition: A | Discharge status: Home / Self Care | Payer: Medicare HMO | Source: Ambulatory Visit | Attending: Physician Assistant | Admitting: Physician Assistant

## 2021-11-25 ENCOUNTER — Ambulatory Visit (INDEPENDENT_AMBULATORY_CARE_PROVIDER_SITE_OTHER): Payer: Medicare HMO | Admitting: Physician Assistant

## 2021-11-25 ENCOUNTER — Ambulatory Visit
Admission: RE | Admit: 2021-11-25 | Discharge: 2021-11-25 | Disposition: A | Discharge status: Home / Self Care | Payer: Medicare HMO | Source: Home / Self Care | Attending: *Deleted | Admitting: *Deleted

## 2021-11-25 VITALS — BP 120/57 | HR 79 | Ht 71.0 in | Wt 231.0 lb

## 2021-11-25 DIAGNOSIS — R109 Unspecified abdominal pain: Secondary | ICD-10-CM | POA: Diagnosis not present

## 2021-11-25 DIAGNOSIS — R6883 Chills (without fever): Secondary | ICD-10-CM

## 2021-11-25 DIAGNOSIS — R300 Dysuria: Secondary | ICD-10-CM | POA: Insufficient documentation

## 2021-11-25 DIAGNOSIS — R972 Elevated prostate specific antigen [PSA]: Secondary | ICD-10-CM | POA: Diagnosis not present

## 2021-11-25 LAB — URINE CULTURE
MICRO NUMBER:: 13846815
Result:: NO GROWTH
SPECIMEN QUALITY:: ADEQUATE

## 2021-11-25 NOTE — Progress Notes (Signed)
Established Patient Office Visit  Name: [name]   MRN: [id number]    DOB: Oct 21, 1950   Date:11/25/2021  Today's Provider: Talitha Givens, MHS, PA-C Introduced myself to the patient as a PA-C and provided education on APPs in clinical practice.         Subjective  Chief Complaint  Chief Complaint  Patient presents with   Urinary Tract Infection    HPI  Reports he has been having chills since Monday night with body aches and dysuria States Tuesday was the worst for symptoms  Reports abdominal pain on Tuesday  Urine culture was negative yesterday States Tuesday he took some leftover abx but did not take them Wed States he is concerned about sepsis since he had this before Denies hesitancy , enuresis, ongoing dysuria, hematuria today   Reports at home he felt flushed and had chills. Temp was elevated yesterday in office He took 3 doses of Macrobid but has stopped since urine culture was found to be negative    Patient Active Problem List   Diagnosis Date Noted   NSTEMI (non-ST elevated myocardial infarction) ([place]) 10/26/2021   Stage 3a chronic kidney disease (CKD) ([place]) 09/07/2021   E coli bacteremia 09/06/2021   CAD S/P percutaneous coronary angioplasty 09/06/2021   Diabetes mellitus ([place]) 09/05/2021   Severe sepsis ([place]) 09/05/2021   Non-ST elevation (NSTEMI) myocardial infarction ([place]) 12/28/2019   Acute kidney injury superimposed on CKD (H[facility])    Hx of gout    Elevated PSA, less than 10 ng/ml 10/03/2017   Lumbar radiculopathy 10/03/2017   Drug-induced myopathy 10/03/2017   Obesity (BMI 30.0-34.9) 10/03/2017   Chronic pain of left knee 10/07/2016   CKD stage 3 due to type 2 diabetes mellitus ([place]) 10/07/2016   Muscle strain 11/17/2015   Low back pain 10/08/2015   Hx of hematuria 07/16/2015   Ganglion cyst of left foot 01/23/2015   Smokeless tobacco use 01/12/2015   Type 2 diabetes mellitus with other specified complication ([place]) 123456   HTN  (hypertension) 10/16/2014   Gout 10/16/2014   GERD without esophagitis 10/16/2014   Essential hypertension 10/16/2014   Injury of tendon of upper extremity 05/29/2014   Glenoid labral tear 05/29/2014   Complete rotator cuff rupture of left shoulder 05/09/2014   Infraspinatus tenosynovitis 05/09/2014   Other synovitis and tenosynovitis, right shoulder 05/09/2014    Past Surgical History:  Procedure Laterality Date   BACK SURGERY  02/26/2019   CATARACT EXTRACTION W/PHACO Left 04/14/2021   Procedure: CATARACT EXTRACTION PHACO AND INTRAOCULAR LENS PLACEMENT ([place]) LEFT DIABETIC maylugin;  Surgeon: Leandrew Koyanagi, MD;  Location: [facility];  Service: Ophthalmology;  Laterality: Left;  11.22 1:25.1   CATARACT EXTRACTION W/PHACO Right 06/09/2021   Procedure: CATARACT EXTRACTION PHACO AND INTRAOCULAR LENS PLACEMENT ([place]) RIGHT DIABETIC MALYUGIN;  Surgeon: Leandrew Koyanagi, MD;  Location: [facility];  Service: Ophthalmology;  Laterality: Right;  Diabetic 4.75 01:05.7   CORONARY STENT INTERVENTION N/A 10/26/2021   Procedure: CORONARY STENT INTERVENTION;  Surgeon: Nelva Bush, MD;  Location: [facility];  Service: Cardiovascular;  Laterality: N/A;   INTRAVASCULAR ULTRASOUND/IVUS N/A 10/26/2021   Procedure: Intravascular Ultrasound/IVUS;  Surgeon: Nelva Bush, MD;  Location: [facility];  Service: Cardiovascular;  Laterality: N/A;   KNEE SURGERY     LEFT HEART CATH AND CORONARY ANGIOGRAPHY N/A 12/30/2019   Procedure: LEFT HEART CATH AND CORONARY ANGIOGRAPHY;  Surgeon: Wellington Hampshire, MD;  Location: [facility]  INVASIVE CV LAB;  Service: Cardiovascular;  Laterality: N/A;   LEFT HEART CATH AND CORONARY ANGIOGRAPHY N/A 10/26/2021   Procedure: LEFT HEART CATH AND CORONARY ANGIOGRAPHY;  Surgeon: Yvonne Kendall, MD;  Location: [facility] INVASIVE CV LAB;  Service: Cardiovascular;  Laterality: N/A;   SHOULDER SURGERY     TONSILLECTOMY      Family History  Problem  Relation Age of Onset   Cancer Father        lung   Heart disease Sister    Heart disease Brother    Prostate cancer Neg Hx     Social History   Tobacco Use   Smoking status: Former    Types: Cigars    Quit date: 2000    Years since quitting: 23.6   Smokeless tobacco: Current    Types: Chew   Tobacco comments:    wife states patient never smoked cigarettes, just chewing tobacco  Substance Use Topics   Alcohol use: Not Currently    Alcohol/week: 0.0 standard drinks of alcohol     Current Outpatient Medications:    ACCU-CHEK SMARTVIEW test strip, TEST BLOOD SUGAR TWICE DAILY, Disp: 200 strip, Rfl: 0   acetaminophen (TYLENOL) 500 MG tablet, Take 1,000 mg by mouth every 8 (eight) hours as needed. , Disp: , Rfl:    allopurinol (ZYLOPRIM) 100 MG tablet, TAKE 1 TABLET AT BEDTIME, Disp: 90 tablet, Rfl: 3   amLODipine (NORVASC) 2.5 MG tablet, Take 1 tablet by mouth daily., Disp: , Rfl:    aspirin EC 81 MG EC tablet, Take 1 tablet (81 mg total) by mouth daily. Swallow whole., Disp: 30 tablet, Rfl: 0   carvedilol (COREG) 12.5 MG tablet, TAKE 1 TABLET TWICE DAILY, Disp: 180 tablet, Rfl: 3   clopidogrel (PLAVIX) 75 MG tablet, Take 1 tablet (75 mg total) by mouth daily., Disp: 90 tablet, Rfl: 0   DROPLET PEN NEEDLES 32G X 4 MM MISC, USE WITH [name] INSULIN INJECTION DAILY AS INSTRUCTED, Disp: 100 each, Rfl: 1   ezetimibe (ZETIA) 10 MG tablet, TAKE 1 TABLET EVERY DAY, Disp: 90 tablet, Rfl: 3   fluticasone (FLONASE) 50 MCG/ACT nasal spray, USE 2 SPRAYS IN EACH NOSTRIL ONCE DAILY USE FOR 4 TO 6 WEEKS, THEN STOP AND USE SEASONALLY OR AS NEEDED., Disp: 48 g, Rfl: 0   Insulin Glargine ([name] KWIKPEN [name]), Inject 36 Units into the skin at bedtime., Disp: , Rfl:    isosorbide mononitrate (IMDUR) 30 MG 24 hr tablet, Take 0.5 tablets (15 mg total) by mouth daily., Disp: 45 tablet, Rfl: 0   lisinopril (ZESTRIL) 40 MG tablet, TAKE 1 TABLET EVERY DAY, Disp: 90 tablet, Rfl: 3   nitrofurantoin,  macrocrystal-monohydrate, (MACROBID) 100 MG capsule, Take 1 capsule (100 mg total) by mouth 2 (two) times daily., Disp: 14 capsule, Rfl: 0   nitroGLYCERIN (NITROSTAT) 0.4 MG SL tablet, Place 1 tablet (0.4 mg total) under the tongue every 5 (five) minutes as needed for chest pain., Disp: 30 tablet, Rfl: 0   pantoprazole (PROTONIX) 20 MG tablet, Take 1 tablet (20 mg total) by mouth 2 (two) times daily before a meal., Disp: 90 tablet, Rfl: 3   RYBELSUS 14 MG TABS, Take 1 tablet (14 mg total) by mouth daily before breakfast., Disp: 30 tablet, Rfl: 0  Allergies  Allergen Reactions   Crestor [Rosuvastatin Calcium] Other (See Comments)    myalgia   Ciprofloxacin    Invokana [Canagliflozin] Other (See Comments)    dehydrated   Prednisone Other (See Comments)  Sulfamethizole     I personally reviewed active problem list, medication list, allergies, notes from last encounter, lab results with the patient/caregiver today.   Review of Systems  Constitutional:  Positive for chills and fever.  Gastrointestinal:  Positive for abdominal pain.  Genitourinary:  Negative for dysuria, flank pain and hematuria.      Objective  Vitals:   11/25/21 1319  BP: (!) 120/57  Pulse: 79  SpO2: 99%  Weight: 231 lb (104.8 kg)  Height: 5\' 11"  (1.803 m)    Body mass index is 32.22 kg/m.  Physical Exam Vitals reviewed.  Constitutional:      General: He is awake.     Appearance: Normal appearance. He is well-developed and well-groomed. He is obese.  HENT:     Head: Normocephalic and atraumatic.  Eyes:     Extraocular Movements: Extraocular movements intact.     Conjunctiva/sclera: Conjunctivae normal.  Cardiovascular:     Rate and Rhythm: Normal rate and regular rhythm.     Pulses: Normal pulses.     Heart sounds: Normal heart sounds. No murmur heard.    No friction rub. No gallop.  Pulmonary:     Effort: Pulmonary effort is normal. No respiratory distress.     Breath sounds: Normal breath  sounds. No wheezing, rhonchi or rales.  Musculoskeletal:     Cervical back: Normal range of motion.  Neurological:     General: No focal deficit present.     Mental Status: He is alert and oriented to person, place, and time. Mental status is at baseline.  Psychiatric:        Mood and Affect: Mood normal.        Behavior: Behavior normal. Behavior is cooperative.        Thought Content: Thought content normal.        Judgment: Judgment normal.      Recent Results (from the past 2160 hour(s))  Cologuard     Status: None   Collection Time: 08/28/21  9:33 AM  Result Value Ref Range   COLOGUARD Negative Negative    Comment:  NEGATIVE TEST RESULT. A negative Cologuard result indicates a low likelihood that a colorectal cancer (CRC) or advanced adenoma (adenomatous polyps with more advanced pre-malignant features)  is present. The chance that a person with a negative Cologuard test has a colorectal cancer is less than 1 in 1500 (negative predictive value >99.9%) or has an  advanced adenoma is less than  5.3% (negative predictive value 94.7%). These data are based on a prospective cross-sectional study of 10,000 individuals at average risk for colorectal cancer who were screened with both Cologuard and colonoscopy. (Imperiale T. et al, N Engl J Med 2014;370(14):1286-1297) The normal value (reference range) for this assay is negative.  COLOGUARD RE-SCREENING RECOMMENDATION: Periodic colorectal cancer screening is an important part of preventive healthcare for asymptomatic individuals at average risk for colorectal cancer.  Following a negative Cologuard result, the [place] Task Force screening guidelines recommend a Cologuard re-screening interval of 3 years.  References: American Cancer Society Guideline for Colorectal Cancer Screening: https://www.cancer.org/cancer/colon-rectal-cancer/detection-diagnosis-staging/acs-recommendations.html.; Rex DK, Boland CR,  Dominitz JK, Colorectal Cancer Screening: Recommendations for Physicians and Patients from the [place] Task Force on Colorectal Cancer Screening , Am J Gastroenterology 2017; I078015.  TEST DESCRIPTION: Composite algorithmic analysis of stool DNA-biomarkers with hemoglobin immunoassay.   Quantitative values of individual biomarkers are not reportable and are not associated with individual biomarker result reference ranges. Cologuard  is intended for colorectal cancer screening of adults of either sex, 73 years or older, who are at average-risk for colorectal cancer (CRC). Cologuard has been approved for use by the U.S. FDA. The performance of Cologuard was  established in a cross sectional study of average-risk adults aged 57-84. Cologuard performance in patients ages 31 to 7 years was estimated by sub-group analysis of near-age groups. Colonoscopies performed for a positive result may find as the most clinically significant lesion: colorectal cancer [4.0%], advanced adenoma (including sessile serrated polyps greater than or equal to 1cm diameter) [20%] or non- advanced adenoma [31%]; or no colorectal neoplasia [45%]. These estimates are derived from a prospective cross-sectional screening study of 10,000 individuals at average risk for colorectal cancer who were screened with both Cologuard and colonoscopy. (Imperiale T. et al, Alison Stalling J Med 2014;370(14):1286-1297.) Cologuard may produce a false negative or false positive result (no colorectal cancer or precancerous polyp present at colonoscopy follow up). A negative Cologuard test result does not guarantee the absence of CRC or advanced adenoma (pre-cancer). The current Cologuard  screening interval is every 3 years. Paramedic and [employer] developer). Cologuard performance data in a 10,000 patient pivotal study using colonoscopy as the reference method can be accessed at the following location: www.exactlabs.com/results.  Additional description of the Cologuard test process, warnings and precautions can be found at www.cologuard.com.   CBG monitoring, ED     Status: Abnormal   Collection Time: 09/05/21  8:36 PM  Result Value Ref Range   Glucose-Capillary 173 (H) 70 - 99 mg/dL    Comment: Glucose reference range applies only to samples taken after fasting for at least 8 hours.  Basic metabolic panel     Status: Abnormal   Collection Time: 09/05/21  8:45 PM  Result Value Ref Range   Sodium 142 135 - 145 mmol/L   Potassium 5.0 3.5 - 5.1 mmol/L   Chloride 106 98 - 111 mmol/L   CO2 23 22 - 32 mmol/L   Glucose, Bld 167 (H) 70 - 99 mg/dL    Comment: Glucose reference range applies only to samples taken after fasting for at least 8 hours.   BUN 19 8 - 23 mg/dL   Creatinine, Ser 1.81 (H) 0.61 - 1.24 mg/dL   Calcium 9.3 8.9 - 10.3 mg/dL   GFR, Estimated 40 (L) >60 mL/min    Comment: (NOTE) Calculated using the CKD-EPI Creatinine Equation (2021)    Anion gap 13 5 - 15    Comment: Performed at [facility], [address] [place], [place] [address]  CBC     Status: None   Collection Time: 09/05/21  8:45 PM  Result Value Ref Range   WBC 5.2 4.0 - 10.5 K/uL   RBC 4.63 4.22 - 5.81 MIL/uL   Hemoglobin 13.2 13.0 - 17.0 g/dL   HCT 42.5 39.0 - 52.0 %   MCV 91.8 80.0 - 100.0 fL   MCH 28.5 26.0 - 34.0 pg   MCHC 31.1 30.0 - 36.0 g/dL   RDW 15.2 11.5 - 15.5 %   Platelets 258 150 - 400 K/uL   nRBC 0.0 0.0 - 0.2 %    Comment: Performed at [facility], [address] [place],  [address]  Troponin I (High Sensitivity)     Status: None   Collection Time: 09/05/21  8:45 PM  Result Value Ref Range   Troponin I (High Sensitivity) 13 <18 ng/L    Comment: (NOTE)  Elevated high sensitivity troponin I (hsTnI) values and significant  changes across serial measurements may suggest ACS but many other  chronic and acute conditions are known to elevate hsTnI results.  Refer to the "Links" section  for chest pain algorithms and additional  guidance. Performed at [facility], [address] [place], [place] [address]   Protime-INR (order if Patient is taking Coumadin / Warfarin)     Status: None   Collection Time: 09/05/21  8:45 PM  Result Value Ref Range   Prothrombin Time 13.8 11.4 - 15.2 seconds   INR 1.1 0.8 - 1.2    Comment: (NOTE) INR goal varies based on device and disease states. Performed at [facility], [address] [place], [place] [address]   Lactic acid, plasma     Status: Abnormal   Collection Time: 09/05/21  8:45 PM  Result Value Ref Range   Lactic Acid, Venous 5.7 (HH) 0.5 - 1.9 mmol/L    Comment: CRITICAL RESULT CALLED TO, READ BACK BY AND VERIFIED WITH HEATHER LEE RN AT 2136 09/05/2021 GAA Performed at [facility], [address] [place], [place] [address]   APTT     Status: None   Collection Time: 09/05/21  8:45 PM  Result Value Ref Range   aPTT 27 24 - 36 seconds    Comment: Performed at [facility], [address] [place], [place] [address]  Hepatic function panel     Status: Abnormal   Collection Time: 09/05/21  8:45 PM  Result Value Ref Range   Total Protein 8.3 (H) 6.5 - 8.1 g/dL   Albumin 4.1 3.5 - 5.0 g/dL   AST 31 15 - 41 U/L   ALT 20 0 - 44 U/L   Alkaline Phosphatase 70 38 - 126 U/L   Total Bilirubin 1.3 (H) 0.3 - 1.2 mg/dL   Bilirubin, Direct 0.3 (H) 0.0 - 0.2 mg/dL   Indirect Bilirubin 1.0 (H) 0.3 - 0.9 mg/dL    Comment: Performed at [facility], [address] [place], [place] [address]  Brain natriuretic peptide     Status: None   Collection Time: 09/05/21  8:45 PM  Result Value Ref Range   B Natriuretic Peptide 71.6 0.0 - 100.0 pg/mL    Comment: Performed at [facility], [address] [place], [place] [address]  D-dimer, quantitative     Status: Abnormal   Collection Time: 09/05/21  8:45 PM  Result Value Ref Range   D-Dimer, Quant 2.61 (H) 0.00 - 0.50  ug/mL-FEU    Comment: (NOTE) At the manufacturer cut-off value of 0.5 g/mL FEU, this assay has a negative predictive value of 95-100%.This assay is intended for use in conjunction with a clinical pretest probability (PTP) assessment model to exclude pulmonary embolism (PE) and deep venous thrombosis (DVT) in outpatients suspected of PE or DVT. Results should be correlated with clinical presentation. Performed at [facility], [address] [place], [place] [address]   Urinalysis, Complete w Microscopic Anterior Nasal Swab     Status: Abnormal   Collection Time: 09/05/21  8:52 PM  Result Value Ref Range   Color, Urine YELLOW (A) YELLOW   APPearance HAZY (A) CLEAR   Specific Gravity, Urine 1.014 1.005 - 1.030   pH 6.0 5.0 - 8.0   Glucose, UA 150 (A) NEGATIVE mg/dL   Hgb urine dipstick SMALL (A) NEGATIVE   Bilirubin Urine NEGATIVE NEGATIVE   Ketones, ur NEGATIVE NEGATIVE mg/dL  Protein, ur >=300 (A) NEGATIVE mg/dL   Nitrite NEGATIVE NEGATIVE   Leukocytes,Ua SMALL (A) NEGATIVE   RBC / HPF 6-10 0 - 5 RBC/hpf   WBC, UA >50 (H) 0 - 5 WBC/hpf   Bacteria, UA MANY (A) NONE SEEN   Squamous Epithelial / LPF 0-5 0 - 5   WBC Clumps PRESENT    Mucus PRESENT     Comment: Performed at [facility], [address], [place], [place] [address]  Urine Culture     Status: Abnormal   Collection Time: 09/05/21  8:52 PM   Specimen: In/Out Cath Urine  Result Value Ref Range   Specimen Description      IN/OUT CATH URINE Performed at [facility], [address] [place], [place] [address]    Special Requests      NONE Performed at [facility], [address] [place], [place] [address]    Culture >=100,000 COLONIES/mL ESCHERICHIA COLI (A)    Report Status 09/08/2021 FINAL    Organism ID, Bacteria ESCHERICHIA COLI (A)       Susceptibility   Escherichia coli - MIC*    AMPICILLIN >=32 RESISTANT Resistant     CEFAZOLIN <=4 SENSITIVE Sensitive      CEFEPIME <=0.12 SENSITIVE Sensitive     CEFTRIAXONE <=0.25 SENSITIVE Sensitive     CIPROFLOXACIN <=0.25 SENSITIVE Sensitive     GENTAMICIN <=1 SENSITIVE Sensitive     IMIPENEM <=0.25 SENSITIVE Sensitive     NITROFURANTOIN <=16 SENSITIVE Sensitive     TRIMETH/SULFA >=320 RESISTANT Resistant     AMPICILLIN/SULBACTAM >=32 RESISTANT Resistant     PIP/TAZO <=4 SENSITIVE Sensitive     * >=100,000 COLONIES/mL ESCHERICHIA COLI  Blood Culture (routine x 2)     Status: Abnormal   Collection Time: 09/05/21  8:59 PM   Specimen: BLOOD  Result Value Ref Range   Specimen Description      BLOOD RAC Performed at [facility], [address], [place], [place] [address]    Special Requests      BOTTLES DRAWN AEROBIC AND ANAEROBIC BCAV Performed at [facility], [address] [place], [place] [address]    Culture  Setup Time      GRAM NEGATIVE RODS AEROBIC BOTTLE ONLY CRITICAL VALUE NOTED.  VALUE IS CONSISTENT WITH PREVIOUSLY REPORTED AND CALLED VALUE. Performed at [facility], [address] [place], [place] [address]    Culture (A)     ESCHERICHIA COLI SUSCEPTIBILITIES PERFORMED ON PREVIOUS CULTURE WITHIN THE LAST 5 DAYS. Performed at [facility], [address], [place], [place] [address]    Report Status 09/08/2021 FINAL   Blood Culture (routine x 2)     Status: Abnormal   Collection Time: 09/05/21  9:04 PM   Specimen: BLOOD  Result Value Ref Range   Specimen Description      BLOOD LAC Performed at [facility], [address] [place], [place] [address]    Special Requests      BOTTLES DRAWN AEROBIC AND ANAEROBIC BCAV Performed at [facility], [address] [place], [place] [address]    Culture  Setup Time      GRAM NEGATIVE RODS IN BOTH AEROBIC AND ANAEROBIC BOTTLES Organism ID to follow CRITICAL RESULT CALLED TO, READ BACK BY AND VERIFIED WITH: [facility] MITCHELL 09/06/21 1225 MU Performed at [facility], [address] [place], [place] [address]    Culture ESCHERICHIA COLI (A)  Report Status 09/08/2021 FINAL    Organism ID, Bacteria ESCHERICHIA COLI       Susceptibility   Escherichia coli - MIC*    AMPICILLIN >=32 RESISTANT Resistant     CEFAZOLIN <=4 SENSITIVE Sensitive     CEFEPIME <=0.12 SENSITIVE Sensitive     CEFTAZIDIME <=1 SENSITIVE Sensitive     CEFTRIAXONE <=0.25 SENSITIVE Sensitive     CIPROFLOXACIN <=0.25 SENSITIVE Sensitive     GENTAMICIN <=1 SENSITIVE Sensitive     IMIPENEM <=0.25 SENSITIVE Sensitive     TRIMETH/SULFA >=320 RESISTANT Resistant     AMPICILLIN/SULBACTAM >=32 RESISTANT Resistant     PIP/TAZO <=4 SENSITIVE Sensitive     * ESCHERICHIA COLI  Blood Culture ID Panel (Reflexed)     Status: Abnormal   Collection Time: 09/05/21  9:04 PM  Result Value Ref Range   Enterococcus faecalis NOT DETECTED NOT DETECTED   Enterococcus Faecium NOT DETECTED NOT DETECTED   Listeria monocytogenes NOT DETECTED NOT DETECTED   Staphylococcus species NOT DETECTED NOT DETECTED   Staphylococcus aureus (BCID) NOT DETECTED NOT DETECTED   Staphylococcus epidermidis NOT DETECTED NOT DETECTED   Staphylococcus lugdunensis NOT DETECTED NOT DETECTED   Streptococcus species NOT DETECTED NOT DETECTED   Streptococcus agalactiae NOT DETECTED NOT DETECTED   Streptococcus pneumoniae NOT DETECTED NOT DETECTED   Streptococcus pyogenes NOT DETECTED NOT DETECTED   A.calcoaceticus-baumannii NOT DETECTED NOT DETECTED   Bacteroides fragilis NOT DETECTED NOT DETECTED   Enterobacterales DETECTED (A) NOT DETECTED    Comment: Enterobacterales represent a large order of gram negative bacteria, not a single organism. CRITICAL RESULT CALLED TO, READ BACK BY AND VERIFIED WITH: DEVON MITCHELL 09/06/21 1225 MU    Enterobacter cloacae complex NOT DETECTED NOT DETECTED   Escherichia coli DETECTED (A) NOT DETECTED    Comment: CRITICAL RESULT CALLED TO, READ BACK BY AND VERIFIED WITH: DEVAN MITCHELL 09/06/21 1225  MU    Klebsiella aerogenes NOT DETECTED NOT DETECTED   Klebsiella oxytoca NOT DETECTED NOT DETECTED   Klebsiella pneumoniae NOT DETECTED NOT DETECTED   Proteus species NOT DETECTED NOT DETECTED   Salmonella species NOT DETECTED NOT DETECTED   Serratia marcescens NOT DETECTED NOT DETECTED   Haemophilus influenzae NOT DETECTED NOT DETECTED   Neisseria meningitidis NOT DETECTED NOT DETECTED   Pseudomonas aeruginosa NOT DETECTED NOT DETECTED   Stenotrophomonas maltophilia NOT DETECTED NOT DETECTED   Candida albicans NOT DETECTED NOT DETECTED   Candida auris NOT DETECTED NOT DETECTED   Candida glabrata NOT DETECTED NOT DETECTED   Candida krusei NOT DETECTED NOT DETECTED   Candida parapsilosis NOT DETECTED NOT DETECTED   Candida tropicalis NOT DETECTED NOT DETECTED   Cryptococcus neoformans/gattii NOT DETECTED NOT DETECTED   CTX-M ESBL NOT DETECTED NOT DETECTED   Carbapenem resistance IMP NOT DETECTED NOT DETECTED   Carbapenem resistance KPC NOT DETECTED NOT DETECTED   Carbapenem resistance NDM NOT DETECTED NOT DETECTED   Carbapenem resist OXA 48 LIKE NOT DETECTED NOT DETECTED   Carbapenem resistance VIM NOT DETECTED NOT DETECTED    Comment: Performed at [facility], [address] [place], [place] [address]  Resp Panel by RT-PCR (Flu A&B, Covid) Anterior Nasal Swab     Status: None   Collection Time: 09/05/21  9:17 PM   Specimen: Anterior Nasal Swab  Result Value Ref Range   SARS Coronavirus 2 by RT PCR NEGATIVE NEGATIVE    Comment: (NOTE) SARS-CoV-2 target nucleic acids are NOT DETECTED.  The SARS-CoV-2 RNA is generally detectable in upper  respiratory specimens during the acute phase of infection. The lowest concentration of SARS-CoV-2 viral copies this assay can detect is 138 copies/mL. A negative result does not preclude SARS-Cov-2 infection and should not be used as the sole basis for treatment or other patient management decisions. A negative result may occur with   improper specimen collection/handling, submission of specimen other than nasopharyngeal swab, presence of viral mutation(s) within the areas targeted by this assay, and inadequate number of viral copies(<138 copies/mL). A negative result must be combined with clinical observations, patient history, and epidemiological information. The expected result is Negative.  Fact Sheet for Patients:  EntrepreneurPulse.com.au  Fact Sheet for Healthcare Providers:  IncredibleEmployment.be  This test is no t yet approved or cleared by the [place] FDA and  has been authorized for detection and/or diagnosis of SARS-CoV-2 by FDA under an Emergency Use Authorization (EUA). This EUA will remain  in effect (meaning this test can be used) for the duration of the COVID-19 declaration under Section 564(b)(1) of the Act, 21 U.S.C.section 360bbb-3(b)(1), unless the authorization is terminated  or revoked sooner.       Influenza A by PCR NEGATIVE NEGATIVE   Influenza B by PCR NEGATIVE NEGATIVE    Comment: (NOTE) The Xpert Xpress SARS-CoV-2/FLU/RSV plus assay is intended as an aid in the diagnosis of influenza from Nasopharyngeal swab specimens and should not be used as a sole basis for treatment. Nasal washings and aspirates are unacceptable for Xpert Xpress SARS-CoV-2/FLU/RSV testing.  Fact Sheet for Patients: EntrepreneurPulse.com.au  Fact Sheet for Healthcare Providers: IncredibleEmployment.be  This test is not yet approved or cleared by the [place] FDA and has been authorized for detection and/or diagnosis of SARS-CoV-2 by FDA under an Emergency Use Authorization (EUA). This EUA will remain in effect (meaning this test can be used) for the duration of the COVID-19 declaration under Section 564(b)(1) of the Act, 21 U.S.C. section 360bbb-3(b)(1), unless the authorization is terminated or revoked.  Performed at  [facility], [address] [place], [place] [address]   Lactic acid, plasma     Status: None   Collection Time: 09/05/21 10:33 PM  Result Value Ref Range   Lactic Acid, Venous 1.4 0.5 - 1.9 mmol/L    Comment: Performed at [facility], [place], [place] [address]  Troponin I (High Sensitivity)     Status: Abnormal   Collection Time: 09/05/21 10:33 PM  Result Value Ref Range   Troponin I (High Sensitivity) 284 (HH) <18 ng/L    Comment: CRITICAL RESULT CALLED TO, READ BACK BY AND VERIFIED WITH HEATHER LEE AT 2332 09/05/2021 DLB (NOTE) Elevated high sensitivity troponin I (hsTnI) values and significant  changes across serial measurements may suggest ACS but many other  chronic and acute conditions are known to elevate hsTnI results.  Refer to the "Links" section for chest pain algorithms and additional  guidance. Performed at [facility], [place], [place] [address]   Troponin I (High Sensitivity)     Status: Abnormal   Collection Time: 09/06/21 12:30 AM  Result Value Ref Range   Troponin I (High Sensitivity) 844 (HH) <18 ng/L    Comment: CRITICAL VALUE NOTED. VALUE IS CONSISTENT WITH PREVIOUSLY REPORTED/CALLED VALUE DLB (NOTE) Elevated high sensitivity troponin I (hsTnI) values and significant  changes across serial measurements may suggest ACS but many other  chronic and acute conditions are known to elevate hsTnI results.  Refer to the "Links" section for chest pain  algorithms and additional  guidance. Performed at [facility], [address] [place], [place] [address]   Cortisol-am, blood     Status: None   Collection Time: 09/06/21  6:48 AM  Result Value Ref Range   Cortisol - AM 14.1 6.7 - 22.6 ug/dL    Comment: Performed at [facility], [address], [place], [place] [address]  Procalcitonin     Status: None   Collection Time: 09/06/21  6:48 AM  Result Value Ref Range   Procalcitonin  24.87 ng/mL    Comment:        Interpretation: PCT >= 10 ng/mL: Important systemic inflammatory response, almost exclusively due to severe bacterial sepsis or septic shock. (NOTE)       Sepsis PCT Algorithm           Lower Respiratory Tract                                      Infection PCT Algorithm    ----------------------------     ----------------------------         PCT < 0.25 ng/mL                PCT < 0.10 ng/mL          Strongly encourage             Strongly discourage   discontinuation of antibiotics    initiation of antibiotics    ----------------------------     -----------------------------       PCT 0.25 - 0.50 ng/mL            PCT 0.10 - 0.25 ng/mL               OR       >80% decrease in PCT            Discourage initiation of                                            antibiotics      Encourage discontinuation           of antibiotics    ----------------------------     -----------------------------         PCT >= 0.50 ng/mL              PCT 0.26 - 0.50 ng/mL                AND       <80% decrease in PCT             Encourage initiation of                                             antibiotics       Encourage continuation           of antibiotics    ----------------------------     -----------------------------        PCT >= 0.50 ng/mL                  PCT > 0.50 ng/mL  AND         increase in PCT                  Strongly encourage                                      initiation of antibiotics    Strongly encourage escalation           of antibiotics                                     -----------------------------                                           PCT <= 0.25 ng/mL                                                 OR                                        > 80% decrease in PCT                                      Discontinue / Do not initiate                                             antibiotics  Performed at [facility],  [address] [place], [place] [address]   HIV Antibody (routine testing w rflx)     Status: None   Collection Time: 09/06/21  6:48 AM  Result Value Ref Range   HIV Screen 4th Generation wRfx Non Reactive Non Reactive    Comment: Performed at [facility], [place] [place], [place] [address]  Heparin level (unfractionated)     Status: None   Collection Time: 09/06/21  6:48 AM  Result Value Ref Range   Heparin Unfractionated 0.49 0.30 - 0.70 IU/mL    Comment: (NOTE) The clinical reportable range upper limit is being lowered to >1.10 to align with the FDA approved guidance for the current laboratory assay.  If heparin results are below expected values, and patient dosage has  been confirmed, suggest follow up testing of antithrombin III levels. Performed at [facility], [address] [place],  [address]   Troponin I (High Sensitivity)     Status: Abnormal   Collection Time: 09/06/21  6:48 AM  Result Value Ref Range   Troponin I (High Sensitivity) 1,203 (HH) <18 ng/L    Comment: CRITICAL VALUE NOTED. VALUE IS CONSISTENT WITH PREVIOUSLY REPORTED/CALLED VALUE DAS (NOTE) Elevated high sensitivity troponin I (hsTnI) values and significant  changes across serial measurements may suggest ACS but many other  chronic and acute conditions are known to elevate hsTnI results.  Refer  to the "Links" section for chest pain algorithms and additional  guidance. Performed at [facility], [place], [place] [address]   Troponin I (High Sensitivity)     Status: Abnormal   Collection Time: 09/06/21  9:46 AM  Result Value Ref Range   Troponin I (High Sensitivity) 1,479 (HH) <18 ng/L    Comment: CRITICAL VALUE NOTED. VALUE IS CONSISTENT WITH PREVIOUSLY REPORTED/CALLED VALUE DAS (NOTE) Elevated high sensitivity troponin I (hsTnI) values and significant  changes across serial measurements may suggest ACS but many other  chronic and acute  conditions are known to elevate hsTnI results.  Refer to the "Links" section for chest pain algorithms and additional  guidance. Performed at [facility], [address] [place], [place] [address]   Glucose, capillary     Status: Abnormal   Collection Time: 09/06/21 11:58 AM  Result Value Ref Range   Glucose-Capillary 144 (H) 70 - 99 mg/dL    Comment: Glucose reference range applies only to samples taken after fasting for at least 8 hours.  Heparin level (unfractionated)     Status: None   Collection Time: 09/06/21 12:43 PM  Result Value Ref Range   Heparin Unfractionated 0.51 0.30 - 0.70 IU/mL    Comment: (NOTE) The clinical reportable range upper limit is being lowered to >1.10 to align with the FDA approved guidance for the current laboratory assay.  If heparin results are below expected values, and patient dosage has  been confirmed, suggest follow up testing of antithrombin III levels. Performed at [facility], [address] [place], [place] [address]   Glucose, capillary     Status: Abnormal   Collection Time: 09/06/21  5:28 PM  Result Value Ref Range   Glucose-Capillary 193 (H) 70 - 99 mg/dL    Comment: Glucose reference range applies only to samples taken after fasting for at least 8 hours.  Glucose, capillary     Status: Abnormal   Collection Time: 09/06/21  7:46 PM  Result Value Ref Range   Glucose-Capillary 156 (H) 70 - 99 mg/dL    Comment: Glucose reference range applies only to samples taken after fasting for at least 8 hours.  Heparin level (unfractionated)     Status: None   Collection Time: 09/07/21  4:10 AM  Result Value Ref Range   Heparin Unfractionated 0.33 0.30 - 0.70 IU/mL    Comment: (NOTE) The clinical reportable range upper limit is being lowered to >1.10 to align with the FDA approved guidance for the current laboratory assay.  If heparin results are below expected values, and patient dosage has  been confirmed, suggest  follow up testing of antithrombin III levels. Performed at [facility] [place], [address] [place], [place] [id number]   Basic metabolic panel     Status: Abnormal   Collection Time: 09/07/21  4:10 AM  Result Value Ref Range   Sodium 138 135 - 145 mmol/L   Potassium 3.5 3.5 - 5.1 mmol/L   Chloride 109 98 - 111 mmol/L   CO2 23 22 - 32 mmol/L   Glucose, Bld 149 (H) 70 - 99 mg/dL    Comment: Glucose reference range applies only to samples taken after fasting for at least 8 hours.   BUN 20 8 - 23 mg/dL   Creatinine, Ser 1.48 (H) 0.61 - 1.24 mg/dL   Calcium 8.4 (L) 8.9 - 10.3 mg/dL   GFR, Estimated 51 (L) >60 mL/min    Comment: (NOTE) Calculated  using the CKD-EPI Creatinine Equation (2021)    Anion gap 6 5 - 15    Comment: Performed at [facility], [address] [place], [place] [address]  CBC     Status: Abnormal   Collection Time: 09/07/21  4:10 AM  Result Value Ref Range   WBC 4.3 4.0 - 10.5 K/uL   RBC 3.63 (L) 4.22 - 5.81 MIL/uL   Hemoglobin 10.4 (L) 13.0 - 17.0 g/dL   HCT 32.0 (L) 39.0 - 52.0 %   MCV 88.2 80.0 - 100.0 fL   MCH 28.7 26.0 - 34.0 pg   MCHC 32.5 30.0 - 36.0 g/dL   RDW 15.2 11.5 - 15.5 %   Platelets 170 150 - 400 K/uL   nRBC 0.0 0.0 - 0.2 %    Comment: Performed at [facility], [address], [place], [place] [address]  Magnesium     Status: None   Collection Time: 09/07/21  4:10 AM  Result Value Ref Range   Magnesium 2.2 1.7 - 2.4 mg/dL    Comment: Performed at [facility], [address] [place], [place] [address]  Glucose, capillary     Status: Abnormal   Collection Time: 09/07/21  7:58 AM  Result Value Ref Range   Glucose-Capillary 138 (H) 70 - 99 mg/dL    Comment: Glucose reference range applies only to samples taken after fasting for at least 8 hours.  ECHOCARDIOGRAM COMPLETE     Status: None   Collection Time: 09/07/21  9:19 AM  Result Value Ref Range   Weight 3,680 oz   Height 71 in   BP 177/90 mmHg    Ao pk vel 1.23 m/s   AV Area VTI 3.16 cm2   AR max vel 2.45 cm2   AV Mean grad 3.0 mmHg   AV Peak grad 6.1 mmHg   S' Lateral 2.90 cm   AV Area mean vel 2.78 cm2   Area-P 1/2 3.53 cm2   MV VTI 1.94 cm2  Glucose, capillary     Status: Abnormal   Collection Time: 09/07/21 11:27 AM  Result Value Ref Range   Glucose-Capillary 225 (H) 70 - 99 mg/dL    Comment: Glucose reference range applies only to samples taken after fasting for at least 8 hours.  Heparin level (unfractionated)     Status: None   Collection Time: 09/07/21  2:28 PM  Result Value Ref Range   Heparin Unfractionated 0.36 0.30 - 0.70 IU/mL    Comment: (NOTE) The clinical reportable range upper limit is being lowered to >1.10 to align with the FDA approved guidance for the current laboratory assay.  If heparin results are below expected values, and patient dosage has  been confirmed, suggest follow up testing of antithrombin III levels. Performed at [facility], [address] [place], [place] [address]   Glucose, capillary     Status: Abnormal   Collection Time: 09/07/21  4:44 PM  Result Value Ref Range   Glucose-Capillary 237 (H) 70 - 99 mg/dL    Comment: Glucose reference range applies only to samples taken after fasting for at least 8 hours.  Glucose, capillary     Status: Abnormal   Collection Time: 09/07/21  9:43 PM  Result Value Ref Range   Glucose-Capillary 119 (H) 70 - 99 mg/dL    Comment: Glucose reference range applies only to samples taken after fasting for at least 8 hours.  Basic metabolic panel     Status: Abnormal  Collection Time: 09/08/21  5:02 AM  Result Value Ref Range   Sodium 140 135 - 145 mmol/L   Potassium 3.4 (L) 3.5 - 5.1 mmol/L   Chloride 106 98 - 111 mmol/L   CO2 27 22 - 32 mmol/L   Glucose, Bld 121 (H) 70 - 99 mg/dL    Comment: Glucose reference range applies only to samples taken after fasting for at least 8 hours.   BUN 17 8 - 23 mg/dL   Creatinine, Ser 1.28 (H) 0.61 -  1.24 mg/dL   Calcium 8.8 (L) 8.9 - 10.3 mg/dL   GFR, Estimated >60 >60 mL/min    Comment: (NOTE) Calculated using the CKD-EPI Creatinine Equation (2021)    Anion gap 7 5 - 15    Comment: Performed at [facility], [address] [place], [place] [address]  CBC     Status: Abnormal   Collection Time: 09/08/21  5:02 AM  Result Value Ref Range   WBC 4.1 4.0 - 10.5 K/uL   RBC 3.70 (L) 4.22 - 5.81 MIL/uL   Hemoglobin 10.4 (L) 13.0 - 17.0 g/dL   HCT 32.6 (L) 39.0 - 52.0 %   MCV 88.1 80.0 - 100.0 fL   MCH 28.1 26.0 - 34.0 pg   MCHC 31.9 30.0 - 36.0 g/dL   RDW 14.7 11.5 - 15.5 %   Platelets 217 150 - 400 K/uL   nRBC 0.0 0.0 - 0.2 %    Comment: Performed at [facility], [address], [place], [place] [address]  Magnesium     Status: None   Collection Time: 09/08/21  5:02 AM  Result Value Ref Range   Magnesium 2.1 1.7 - 2.4 mg/dL    Comment: Performed at [facility], [address] [place], [place] [address]  Glucose, capillary     Status: Abnormal   Collection Time: 09/08/21  7:50 AM  Result Value Ref Range   Glucose-Capillary 121 (H) 70 - 99 mg/dL    Comment: Glucose reference range applies only to samples taken after fasting for at least 8 hours.  Troponin I (High Sensitivity)     Status: Abnormal   Collection Time: 09/08/21  9:17 AM  Result Value Ref Range   Troponin I (High Sensitivity) 328 (HH) <18 ng/L    Comment: CRITICAL VALUE NOTED. VALUE IS CONSISTENT WITH PREVIOUSLY REPORTED/CALLED VALUE/HKP (NOTE) Elevated high sensitivity troponin I (hsTnI) values and significant  changes across serial measurements may suggest ACS but many other  chronic and acute conditions are known to elevate hsTnI results.  Refer to the "Links" section for chest pain algorithms and additional  guidance. Performed at [facility], [place], [place] [address]   Troponin I (High Sensitivity)     Status: Abnormal   Collection Time:  09/08/21 11:12 AM  Result Value Ref Range   Troponin I (High Sensitivity) 299 (HH) <18 ng/L    Comment: CRITICAL VALUE NOTED. VALUE IS CONSISTENT WITH PREVIOUSLY REPORTED/CALLED VALUE MU (NOTE) Elevated high sensitivity troponin I (hsTnI) values and significant  changes across serial measurements may suggest ACS but many other  chronic and acute conditions are known to elevate hsTnI results.  Refer to the "Links" section for chest pain algorithms and additional  guidance. Performed at [facility], [address] [place], [place] [address]   Glucose, capillary     Status: Abnormal   Collection Time: 09/08/21 11:32 AM  Result Value Ref Range   Glucose-Capillary 174 (  H) 70 - 99 mg/dL    Comment: Glucose reference range applies only to samples taken after fasting for at least 8 hours.  Glucose, capillary     Status: Abnormal   Collection Time: 09/08/21  4:37 PM  Result Value Ref Range   Glucose-Capillary 218 (H) 70 - 99 mg/dL    Comment: Glucose reference range applies only to samples taken after fasting for at least 8 hours.  Glucose, capillary     Status: Abnormal   Collection Time: 09/08/21  9:17 PM  Result Value Ref Range   Glucose-Capillary 175 (H) 70 - 99 mg/dL    Comment: Glucose reference range applies only to samples taken after fasting for at least 8 hours.  Basic metabolic panel     Status: Abnormal   Collection Time: 09/09/21  4:14 AM  Result Value Ref Range   Sodium 141 135 - 145 mmol/L   Potassium 3.8 3.5 - 5.1 mmol/L   Chloride 106 98 - 111 mmol/L   CO2 30 22 - 32 mmol/L   Glucose, Bld 115 (H) 70 - 99 mg/dL    Comment: Glucose reference range applies only to samples taken after fasting for at least 8 hours.   BUN 16 8 - 23 mg/dL   Creatinine, Ser 1.22 0.61 - 1.24 mg/dL   Calcium 9.3 8.9 - 10.3 mg/dL   GFR, Estimated >60 >60 mL/min    Comment: (NOTE) Calculated using the CKD-EPI Creatinine Equation (2021)    Anion gap 5 5 - 15    Comment: Performed  at [facility], [address] [place], [place] [address]  CBC     Status: Abnormal   Collection Time: 09/09/21  4:14 AM  Result Value Ref Range   WBC 5.5 4.0 - 10.5 K/uL   RBC 4.15 (L) 4.22 - 5.81 MIL/uL   Hemoglobin 11.6 (L) 13.0 - 17.0 g/dL   HCT 36.3 (L) 39.0 - 52.0 %   MCV 87.5 80.0 - 100.0 fL   MCH 28.0 26.0 - 34.0 pg   MCHC 32.0 30.0 - 36.0 g/dL   RDW 14.8 11.5 - 15.5 %   Platelets 238 150 - 400 K/uL   nRBC 0.0 0.0 - 0.2 %    Comment: Performed at [facility], [address] [place], [place] [address]  Magnesium     Status: None   Collection Time: 09/09/21  4:14 AM  Result Value Ref Range   Magnesium 2.2 1.7 - 2.4 mg/dL    Comment: Performed at [facility], [address] [place], [place] [address]  Glucose, capillary     Status: Abnormal   Collection Time: 09/09/21  7:14 AM  Result Value Ref Range   Glucose-Capillary 122 (H) 70 - 99 mg/dL    Comment: Glucose reference range applies only to samples taken after fasting for at least 8 hours.  Troponin I (High Sensitivity)     Status: None   Collection Time: 10/18/21  9:30 AM  Result Value Ref Range   Troponin I (High Sensitivity) 6 <18 ng/L    Comment: (NOTE) Elevated high sensitivity troponin I (hsTnI) values and significant  changes across serial measurements may suggest ACS but many other  chronic and acute conditions are known to elevate hsTnI results.  Refer to the "Links" section for chest pain algorithms and additional  guidance. Performed at [facility], [address], [place], [place] [address]   CBC     Status: Abnormal   Collection  Time: 10/20/21 10:56 AM  Result Value Ref Range   WBC 6.1 4.0 - 10.5 K/uL   RBC 4.52 4.22 - 5.81 MIL/uL   Hemoglobin 12.9 (L) 13.0 - 17.0 g/dL   HCT 16.139.9 09.639.0 - 04.552.0 %   MCV 88.3 80.0 - 100.0 fL   MCH 28.5 26.0 - 34.0 pg   MCHC 32.3 30.0 - 36.0 g/dL   RDW 40.914.2 81.111.5 - 91.415.5 %   Platelets 239 150 - 400 K/uL   nRBC 0.0 0.0 - 0.2 %     Comment: Performed at [facility], [address] [place], [place] [address]  Basic metabolic panel     Status: Abnormal   Collection Time: 10/20/21 10:56 AM  Result Value Ref Range   Sodium 135 135 - 145 mmol/L   Potassium 4.3 3.5 - 5.1 mmol/L   Chloride 105 98 - 111 mmol/L   CO2 22 22 - 32 mmol/L   Glucose, Bld 235 (H) 70 - 99 mg/dL    Comment: Glucose reference range applies only to samples taken after fasting for at least 8 hours.   BUN 22 8 - 23 mg/dL   Creatinine, Ser 6.211.64 (H) 0.61 - 1.24 mg/dL   Calcium 9.1 8.9 - 30.810.3 mg/dL   GFR, Estimated 45 (L) >60 mL/min    Comment: (NOTE) Calculated using the CKD-EPI Creatinine Equation (2021)    Anion gap 8 5 - 15    Comment: Performed at [facility], [address] [place], [place] [address]  Glucose, capillary     Status: Abnormal   Collection Time: 10/26/21  9:54 AM  Result Value Ref Range   Glucose-Capillary 137 (H) 70 - 99 mg/dL    Comment: Glucose reference range applies only to samples taken after fasting for at least 8 hours.  POCT Activated clotting time     Status: None   Collection Time: 10/26/21 11:54 AM  Result Value Ref Range   Activated Clotting Time 293 seconds    Comment: Reference range 74-137 seconds for patients not on anticoagulant therapy.  POCT Activated clotting time     Status: None   Collection Time: 10/26/21 12:12 PM  Result Value Ref Range   Activated Clotting Time 293 seconds    Comment: Reference range 74-137 seconds for patients not on anticoagulant therapy.  POCT Activated clotting time     Status: None   Collection Time: 10/26/21 12:34 PM  Result Value Ref Range   Activated Clotting Time 305 seconds    Comment: Reference range 74-137 seconds for patients not on anticoagulant therapy.  Glucose, capillary     Status: Abnormal   Collection Time: 10/26/21  1:26 PM  Result Value Ref Range   Glucose-Capillary 122 (H) 70 - 99 mg/dL    Comment: Glucose reference range applies  only to samples taken after fasting for at least 8 hours.  Glucose, capillary     Status: Abnormal   Collection Time: 10/26/21  4:55 PM  Result Value Ref Range   Glucose-Capillary 160 (H) 70 - 99 mg/dL    Comment: Glucose reference range applies only to samples taken after fasting for at least 8 hours.  Glucose, capillary     Status: Abnormal   Collection Time: 10/26/21  8:22 PM  Result Value Ref Range   Glucose-Capillary 179 (H) 70 - 99 mg/dL    Comment: Glucose reference range applies only to samples taken after fasting for at least 8 hours.  Basic metabolic panel  Status: Abnormal   Collection Time: 10/27/21  4:39 AM  Result Value Ref Range   Sodium 138 135 - 145 mmol/L   Potassium 4.0 3.5 - 5.1 mmol/L   Chloride 110 98 - 111 mmol/L   CO2 23 22 - 32 mmol/L   Glucose, Bld 149 (H) 70 - 99 mg/dL    Comment: Glucose reference range applies only to samples taken after fasting for at least 8 hours.   BUN 20 8 - 23 mg/dL   Creatinine, Ser 1.57 (H) 0.61 - 1.24 mg/dL   Calcium 8.9 8.9 - 10.3 mg/dL   GFR, Estimated 47 (L) >60 mL/min    Comment: (NOTE) Calculated using the CKD-EPI Creatinine Equation (2021)    Anion gap 5 5 - 15    Comment: Performed at [facility], [address] [place], [place] [address]  CBC     Status: Abnormal   Collection Time: 10/27/21  4:39 AM  Result Value Ref Range   WBC 5.4 4.0 - 10.5 K/uL   RBC 4.00 (L) 4.22 - 5.81 MIL/uL   Hemoglobin 11.3 (L) 13.0 - 17.0 g/dL   HCT 35.2 (L) 39.0 - 52.0 %   MCV 88.0 80.0 - 100.0 fL   MCH 28.3 26.0 - 34.0 pg   MCHC 32.1 30.0 - 36.0 g/dL   RDW 14.4 11.5 - 15.5 %   Platelets 211 150 - 400 K/uL   nRBC 0.0 0.0 - 0.2 %    Comment: Performed at [facility], [address] [place], [place] [address]  Glucose, capillary     Status: Abnormal   Collection Time: 10/27/21  7:52 AM  Result Value Ref Range   Glucose-Capillary 169 (H) 70 - 99 mg/dL    Comment: Glucose reference range applies only to  samples taken after fasting for at least 8 hours.  Glucose, capillary     Status: Abnormal   Collection Time: 10/27/21 11:56 AM  Result Value Ref Range   Glucose-Capillary 147 (H) 70 - 99 mg/dL    Comment: Glucose reference range applies only to samples taken after fasting for at least 8 hours.  Basic metabolic panel     Status: Abnormal   Collection Time: 11/01/21  2:58 PM  Result Value Ref Range   Sodium 139 135 - 145 mmol/L   Potassium 4.1 3.5 - 5.1 mmol/L   Chloride 109 98 - 111 mmol/L   CO2 22 22 - 32 mmol/L   Glucose, Bld 219 (H) 70 - 99 mg/dL    Comment: Glucose reference range applies only to samples taken after fasting for at least 8 hours.   BUN 18 8 - 23 mg/dL   Creatinine, Ser 1.80 (H) 0.61 - 1.24 mg/dL   Calcium 9.1 8.9 - 10.3 mg/dL   GFR, Estimated 40 (L) >60 mL/min    Comment: (NOTE) Calculated using the CKD-EPI Creatinine Equation (2021)    Anion gap 8 5 - 15    Comment: Performed at [facility], [address] [place], [place] [address]  Urine Culture     Status: None   Collection Time: 11/23/21  2:42 PM   Specimen: Urine  Result Value Ref Range   MICRO NUMBER: [id number]    SPECIMEN QUALITY: Adequate    Sample Source URINE, CLEAN CATCH    STATUS: FINAL    Result: No Growth   POCT urinalysis dipstick     Status: Abnormal   Collection Time: 11/23/21  2:42 PM  Result Value  Ref Range   Color, UA     Clarity, UA     Glucose, UA Positive (A) Negative   Bilirubin, UA Negative    Ketones, UA Negative    Spec Grav, UA 1.020 1.010 - 1.025   Blood, UA Small    pH, UA 5.0 5.0 - 8.0   Protein, UA Negative Negative   Urobilinogen, UA 0.2 0.2 or 1.0 E.U./dL   Nitrite, UA Positive    Leukocytes, UA Small (1+) (A) Negative   Appearance     Odor       PHQ2/9:    09/16/2021   10:27 AM 01/26/2021    8:31 AM 11/10/2020    2:14 PM 01/23/2020    4:14 PM 01/21/2020    8:34 AM  Depression screen PHQ 2/9  Decreased Interest 0 0 0 0 0  Down, Depressed,  Hopeless 0 0 0 0 0  PHQ - 2 Score 0 0 0 0 0  Altered sleeping 0  0 0   Tired, decreased energy 0  0 1   Change in appetite 0  0 0   Feeling bad or failure about yourself  0  0 0   Trouble concentrating 0  0 0   Moving slowly or fidgety/restless 0  0 0   Suicidal thoughts 0  0 0   PHQ-9 Score 0  0 1   Difficult doing work/chores Not difficult at all  Not difficult at all Not difficult at all       Fall Risk:    09/16/2021   10:27 AM 01/26/2021    8:31 AM 11/10/2020    2:14 PM 01/22/2020   10:37 AM 01/21/2020    8:34 AM  Fall Risk   Falls in the past year? 0 0 0 0 0  Number falls in past yr: 0  0 0   Injury with Fall? 0  0 0   Risk for fall due to : No Fall Risks Medication side effect  No Fall Risks Medication side effect  Follow up Falls evaluation completed Falls evaluation completed;Education provided;Falls prevention discussed Falls evaluation completed Falls evaluation completed;Falls prevention discussed;Education provided Falls evaluation completed;Education provided;Falls prevention discussed      Functional Status Survey:      Assessment & Plan  Problem List Items Addressed This Visit   None Visit Diagnoses     Chills    -  Primary Acute, new concern Reports he is having chills and elevated temps intermittently  UA was suggestive of UTI and was started on Macrobid - urine culture was negative and was advised to stop abx Patient still concerned for infection - will check CMP, CBC  Will check KUB for kidney stone given dysuria and chills. - KUB negative for evidence of active or passing kidney stones  Will check PSA as well to rule out prostatitis  Results to guide further management  Follow up as needed     Relevant Orders   CBC w/Diff/Platelet   COMPLETE METABOLIC PANEL WITH GFR   PSA   Dysuria     See chills A&P for management    Relevant Orders   PSA   DG Abd 1 View (Completed)        No follow-ups on file.   I, Lilliah Priego E Keyuana Wank, PA-C, have  reviewed all documentation for this visit. The documentation on 11/25/21 for the exam, diagnosis, procedures, and orders are all accurate and complete.   Talitha Givens, MHS, PA-C [facility]  [place]

## 2021-11-26 ENCOUNTER — Encounter: Payer: Self-pay | Admitting: Physician Assistant

## 2021-11-26 ENCOUNTER — Ambulatory Visit (INDEPENDENT_AMBULATORY_CARE_PROVIDER_SITE_OTHER): Payer: Medicare HMO | Admitting: Physician Assistant

## 2021-11-26 VITALS — BP 123/60 | HR 82 | Temp 99.3°F | Ht 71.0 in | Wt 231.0 lb

## 2021-11-26 DIAGNOSIS — R300 Dysuria: Secondary | ICD-10-CM | POA: Diagnosis not present

## 2021-11-26 LAB — COMPLETE METABOLIC PANEL WITHOUT GFR
AG Ratio: 1.2 (calc) (ref 1.0–2.5)
ALT: 13 U/L (ref 9–46)
AST: 12 U/L (ref 10–35)
Albumin: 3.8 g/dL (ref 3.6–5.1)
Alkaline phosphatase (APISO): 63 U/L (ref 35–144)
BUN/Creatinine Ratio: 12 (calc) (ref 6–22)
BUN: 21 mg/dL (ref 7–25)
CO2: 22 mmol/L (ref 20–32)
Calcium: 9.2 mg/dL (ref 8.6–10.3)
Chloride: 102 mmol/L (ref 98–110)
Creat: 1.71 mg/dL — ABNORMAL HIGH (ref 0.70–1.28)
Globulin: 3.3 g/dL (ref 1.9–3.7)
Glucose, Bld: 276 mg/dL — ABNORMAL HIGH (ref 65–99)
Potassium: 4.2 mmol/L (ref 3.5–5.3)
Sodium: 136 mmol/L (ref 135–146)
Total Bilirubin: 0.5 mg/dL (ref 0.2–1.2)
Total Protein: 7.1 g/dL (ref 6.1–8.1)
eGFR: 43 mL/min/1.73m2 — ABNORMAL LOW

## 2021-11-26 LAB — POCT URINALYSIS DIPSTICK
Bilirubin, UA: NEGATIVE
Glucose, UA: POSITIVE — AB
Ketones, UA: NEGATIVE
Nitrite, UA: NEGATIVE
Protein, UA: POSITIVE — AB
Spec Grav, UA: 1.025
Urobilinogen, UA: 0.2 U/dL
pH, UA: 5

## 2021-11-26 LAB — CBC WITH DIFFERENTIAL/PLATELET
Absolute Monocytes: 610 {cells}/uL (ref 200–950)
Basophils Absolute: 44 {cells}/uL (ref 0–200)
Basophils Relative: 0.4 %
Eosinophils Absolute: 120 {cells}/uL (ref 15–500)
Eosinophils Relative: 1.1 %
HCT: 39.1 % (ref 38.5–50.0)
Hemoglobin: 12.9 g/dL — ABNORMAL LOW (ref 13.2–17.1)
Lymphs Abs: 1929 {cells}/uL (ref 850–3900)
MCH: 29.6 pg (ref 27.0–33.0)
MCHC: 33 g/dL (ref 32.0–36.0)
MCV: 89.7 fL (ref 80.0–100.0)
MPV: 10.4 fL (ref 7.5–12.5)
Monocytes Relative: 5.6 %
Neutro Abs: 8197 {cells}/uL — ABNORMAL HIGH (ref 1500–7800)
Neutrophils Relative %: 75.2 %
Platelets: 244 Thousand/uL (ref 140–400)
RBC: 4.36 Million/uL (ref 4.20–5.80)
RDW: 14.1 % (ref 11.0–15.0)
Total Lymphocyte: 17.7 %
WBC: 10.9 Thousand/uL — ABNORMAL HIGH (ref 3.8–10.8)

## 2021-11-26 LAB — TEST AUTHORIZATION

## 2021-11-26 LAB — PSA: PSA: 26.22 ng/mL — ABNORMAL HIGH

## 2021-11-26 NOTE — Progress Notes (Signed)
Acute Office Visit   Patient: [name]   DOB: 1950/11/23   71 y.o. Male  MRN: [id number] Visit Date: 11/26/2021  Today's healthcare provider: Oswaldo Conroy Marketa Midkiff, PA-C  Introduced myself to the patient as a Secondary school teacher and provided education on APPs in clinical practice.   CC:  chills and concern for infection    Subjective    HPI    Discussed results of yesterday's testing with patient and concerns for prostatitis  Chills: reports chills improved last night. Was able to feel warm last night  Fevers: none at home  Dysuria: Denies pain  Rectal pain or pressure: resolving from earlier in the week States urinating last night, he did not have any burning sensation  Difficulty urinating: Not really.  States he felt shaky this morning but hadn't eaten much at breakfast - they've resolved at this time  Took 2 tylenol this AM for headache Has not taken AZO since Tuesday  Was using Ice pack on Tuesday in groin area due to burning   Reports he took a previous leftover abx Tuesday prior to getting his UA and culture (took 2 doses of this) and took AZO prior to UA on 11/23/21     Medications: Outpatient Medications Prior to Visit  Medication Sig   ACCU-CHEK SMARTVIEW test strip TEST BLOOD SUGAR TWICE DAILY   acetaminophen (TYLENOL) 500 MG tablet Take 1,000 mg by mouth every 8 (eight) hours as needed.    allopurinol (ZYLOPRIM) 100 MG tablet TAKE 1 TABLET AT BEDTIME   amLODipine (NORVASC) 2.5 MG tablet Take 1 tablet by mouth daily.   aspirin EC 81 MG EC tablet Take 1 tablet (81 mg total) by mouth daily. Swallow whole.   carvedilol (COREG) 12.5 MG tablet TAKE 1 TABLET TWICE DAILY   clopidogrel (PLAVIX) 75 MG tablet Take 1 tablet (75 mg total) by mouth daily.   DROPLET PEN NEEDLES 32G X 4 MM MISC USE WITH [name] INSULIN INJECTION DAILY AS INSTRUCTED   ezetimibe (ZETIA) 10 MG tablet TAKE 1 TABLET EVERY DAY   fluticasone (FLONASE) 50 MCG/ACT nasal spray USE 2 SPRAYS IN EACH NOSTRIL  ONCE DAILY USE FOR 4 TO 6 WEEKS, THEN STOP AND USE SEASONALLY OR AS NEEDED.   Insulin Glargine ([name] KWIKPEN [place]) Inject 36 Units into the skin at bedtime.   isosorbide mononitrate (IMDUR) 30 MG 24 hr tablet Take 0.5 tablets (15 mg total) by mouth daily.   lisinopril (ZESTRIL) 40 MG tablet TAKE 1 TABLET EVERY DAY   nitrofurantoin, macrocrystal-monohydrate, (MACROBID) 100 MG capsule Take 1 capsule (100 mg total) by mouth 2 (two) times daily.   nitroGLYCERIN (NITROSTAT) 0.4 MG SL tablet Place 1 tablet (0.4 mg total) under the tongue every 5 (five) minutes as needed for chest pain.   pantoprazole (PROTONIX) 20 MG tablet Take 1 tablet (20 mg total) by mouth 2 (two) times daily before a meal.   RYBELSUS 14 MG TABS Take 1 tablet (14 mg total) by mouth daily before breakfast.   No facility-administered medications prior to visit.    Review of Systems  Genitourinary:  Negative for difficulty urinating, dysuria and hematuria.       Objective    BP 123/60   Pulse 82   Ht 5\' 11"  (1.803 m)   Wt 231 lb (104.8 kg)   SpO2 97%   BMI 32.22 kg/m    Physical Exam Vitals reviewed.  Constitutional:      General: He  is awake.     Appearance: Normal appearance. He is well-developed and well-groomed. He is obese.  HENT:     Head: Normocephalic and atraumatic.  Cardiovascular:     Rate and Rhythm: Normal rate and regular rhythm.     Pulses: Normal pulses.     Heart sounds: Normal heart sounds.  Genitourinary:    Prostate: Enlarged. Not tender and no nodules present.     Comments: Prostate is slightly enlarged but does not feel boggy  Patient denies tenderness to palpation of prostate Neurological:     Mental Status: He is alert.  Psychiatric:        Attention and Perception: Attention and perception normal.        Mood and Affect: Mood and affect normal.        Speech: Speech normal.        Behavior: Behavior normal. Behavior is cooperative.           Urinalysis    Component  Value Date/Time   COLORURINE YELLOW (A) 09/05/2021 2052   APPEARANCEUR HAZY (A) 09/05/2021 2052   APPEARANCEUR Cloudy (A) 12/10/2019 0926   LABSPEC 1.014 09/05/2021 2052   PHURINE 6.0 09/05/2021 2052   GLUCOSEU 150 (A) 09/05/2021 2052   HGBUR SMALL (A) 09/05/2021 2052   BILIRUBINUR Negative 11/26/2021 1516   BILIRUBINUR Negative 12/10/2019 0926   KETONESUR NEGATIVE 09/05/2021 2052   PROTEINUR Positive (A) 11/26/2021 1516   PROTEINUR >=300 (A) 09/05/2021 2052   UROBILINOGEN 0.2 11/26/2021 1516   NITRITE Negative 11/26/2021 1516   NITRITE NEGATIVE 09/05/2021 2052   LEUKOCYTESUR Moderate (2+) (A) 11/26/2021 1516   LEUKOCYTESUR SMALL (A) 09/05/2021 2052      No results found for any visits on 11/26/21.  Assessment & Plan      No follow-ups on file.       Problem List Items Addressed This Visit   None Visit Diagnoses     Dysuria    -  Primary Acute, unsure if resolving Patient reports chills have seemed better since yesterday's apt He has not been on Abx for several days so will recheck UA- this was + for leukocytes, RBCs, protein, glucose  Will send for culture to fully rule out UTI Prostate was slightly enlarged but firm - reduced suspicion for prostatitis at this time. Recommend PSA trending for monitoring and may need to refer to urology for assistance Culture results to guide further management  Follow up as needed.     Relevant Orders   POCT Urinalysis Dipstick (Completed)   Urine Culture        No follow-ups on file.   I, Rusell Meneely E Matea Stanard, PA-C, have reviewed all documentation for this visit. The documentation on 11/26/21 for the exam, diagnosis, procedures, and orders are all accurate and complete.   Jacquelin Hawking, MHS, PA-C [facility]

## 2021-11-26 NOTE — Patient Instructions (Signed)
I am going to send your urine off for culture again to make sure there isn't anything growing there.  Your prostate felt slightly enlarged but did not feel like it is inflamed or infected.  Let us know if you have continued chills and fevers, pain with urination or other symptoms We will let you know the results of the culture once they come back.

## 2021-11-28 LAB — URINE CULTURE
MICRO NUMBER:: 13864607
Result:: NO GROWTH
SPECIMEN QUALITY:: ADEQUATE

## 2021-11-30 ENCOUNTER — Encounter: Payer: Self-pay | Admitting: Physician Assistant

## 2021-11-30 DIAGNOSIS — N3000 Acute cystitis without hematuria: Secondary | ICD-10-CM

## 2021-11-30 MED ORDER — CEFADROXIL 500 MG PO CAPS: 500.0000 mg | ORAL_CAPSULE | Freq: Two times a day (BID) | ORAL | 0 refills | Status: DC

## 2021-12-06 DIAGNOSIS — I10 Essential (primary) hypertension: Secondary | ICD-10-CM | POA: Diagnosis not present

## 2021-12-06 DIAGNOSIS — E1122 Type 2 diabetes mellitus with diabetic chronic kidney disease: Secondary | ICD-10-CM | POA: Diagnosis not present

## 2021-12-06 DIAGNOSIS — N1831 Chronic kidney disease, stage 3a: Secondary | ICD-10-CM | POA: Diagnosis not present

## 2021-12-09 ENCOUNTER — Encounter: Payer: Self-pay | Admitting: Family Medicine

## 2021-12-09 DIAGNOSIS — N3000 Acute cystitis without hematuria: Secondary | ICD-10-CM

## 2021-12-10 MED ORDER — CEFADROXIL 500 MG PO CAPS: 500.0000 mg | ORAL_CAPSULE | Freq: Two times a day (BID) | ORAL | 0 refills | Status: DC

## 2021-12-13 DIAGNOSIS — I10 Essential (primary) hypertension: Secondary | ICD-10-CM | POA: Diagnosis not present

## 2021-12-13 DIAGNOSIS — E1122 Type 2 diabetes mellitus with diabetic chronic kidney disease: Secondary | ICD-10-CM | POA: Diagnosis not present

## 2021-12-13 DIAGNOSIS — R300 Dysuria: Secondary | ICD-10-CM | POA: Diagnosis not present

## 2021-12-13 DIAGNOSIS — N1831 Chronic kidney disease, stage 3a: Secondary | ICD-10-CM | POA: Diagnosis not present

## 2021-12-27 ENCOUNTER — Other Ambulatory Visit: Payer: Self-pay | Admitting: Family Medicine

## 2021-12-27 DIAGNOSIS — R300 Dysuria: Secondary | ICD-10-CM | POA: Diagnosis not present

## 2021-12-27 DIAGNOSIS — N390 Urinary tract infection, site not specified: Secondary | ICD-10-CM | POA: Diagnosis not present

## 2021-12-27 DIAGNOSIS — Z0100 Encounter for examination of eyes and vision without abnormal findings: Secondary | ICD-10-CM | POA: Diagnosis not present

## 2021-12-27 DIAGNOSIS — H353131 Nonexudative age-related macular degeneration, bilateral, early dry stage: Secondary | ICD-10-CM | POA: Diagnosis not present

## 2021-12-27 DIAGNOSIS — J3089 Other allergic rhinitis: Secondary | ICD-10-CM

## 2021-12-27 LAB — HM DIABETES EYE EXAM

## 2021-12-28 NOTE — Telephone Encounter (Signed)
Requested Prescriptions  Pending Prescriptions Disp Refills  . fluticasone (FLONASE) 50 MCG/ACT nasal spray [Pharmacy Med Name: FLUTICASONE PROPIONATE 50 MCG/ACT Suspension] 48 g 0    Sig: USE 2 SPRAYS IN EACH NOSTRIL ONCE DAILY - USE FOR 4 TO 6 WEEKS, THEN STOP AND USE SEASONALLY OR AS NEEDED.     Ear, Nose, and Throat: Nasal Preparations - Corticosteroids Passed - 12/27/2021  2:27 PM      Passed - Valid encounter within last 12 months    Recent Outpatient Visits          1 month ago [facility] Mecum, Dani Gobble, [place]   1 month ago [place], [place]   1 month ago [facility] [place], Coralie Keens, NP   3 months ago E coli bacteremia   [place], DO   4 months ago Type 2 diabetes mellitus with other specified complication, with long-term current use of insulin ([place])   [facility] [name], [name], DO      Future Appointments            In 1 month  [facility], [place], [place]   In 1 month [name], [name], [facility], [place]           . ACCU-CHEK SMARTVIEW test strip [Pharmacy Med Name: ACCU-CHEK SMARTVIEW STRIPS   Strip] 200 strip 0    Sig: TEST BLOOD SUGAR TWO TIMES DAILY     Endocrinology: Diabetes - Testing Supplies Passed - 12/27/2021  2:27 PM      Passed - Valid encounter within last 12 months    Recent Outpatient Visits          1 month ago [facility] Mecum, Dani Gobble, [place]   1 month ago [place], [place]   1 month ago [facility] [place], Coralie Keens, NP   3 months ago E coli bacteremia   [place], DO   4 months ago Type 2 diabetes mellitus with other specified complication, with long-term current use of insulin ([place])   [facility] [name],  [name], DO      Future Appointments            In 1 month  [facility], [place]   In 1 month [name], [name], [facility], [facility]

## 2021-12-29 ENCOUNTER — Ambulatory Visit: Payer: Medicare HMO | Admitting: Pharmacist

## 2021-12-29 DIAGNOSIS — N183 Chronic kidney disease, stage 3 unspecified: Secondary | ICD-10-CM

## 2021-12-29 DIAGNOSIS — E1169 Type 2 diabetes mellitus with other specified complication: Secondary | ICD-10-CM

## 2021-12-29 NOTE — Patient Instructions (Signed)
Goals Addressed             This Visit's Progress    Pharmacy - Patient Goals       Our goal A1c is less than 7%. This corresponds with fasting sugars less than 130 and 2 hour after meal sugars less than 180. Please check your fasting blood sugar each morning and keep a record of the results  Please check your blood pressure and keep record of the results  Feel free to call me with any questions or concerns. I look forward to our next call   Wallace Cullens, PharmD, [facility] [place] [phone]

## 2021-12-29 NOTE — Chronic Care Management (AMB) (Signed)
Chief Complaint  Patient presents with   Care Coordination    T2DM, CAD, CKD, HTN    [name] is a 71 y.o. year old male who presented for a telephone visit.   They were referred to the pharmacist by their PCP for assistance in managing diabetes, hypertension, and CAD .   Perform chart review -Patient seen for Office Visit with [facility] on 9/18. Provider advised patient: Extended the treatment with cefadroxil for another 10 days related to dysuria Referral placed to urology for further evaluation   Subjective:  Today patient reports completed course of cefadroxil as prescribed by Nephrologist on 9/18. However, reports symptoms dysuria and urgency returned once completed, so contacted Nephrologist and provider sent in new Rx to continue him on cefadroxil 500 twice daily for 14 more days while waiting to see Urologist.   Reports appointment with Urology now scheduled for 10/23 at [facility]  Care Team: Primary Care Provider: [name], DO ; Next Scheduled Visit: 02/25/2022 Cardiologist: Debbe Odea, MD Nephrologist: Mosetta Pigeon, MD  ; Next Scheduled Visit: 06/12/2021  Medication Access/Adherence  Current Pharmacy:  [facility] [address], [place] - [address] [place] [address] Phone: [phone] Fax: [phone]  [facility] - [place], [place] - [address] [place] [address] Phone: [phone] Fax: [phone]   Patient reports affordability concerns with their medications: No  Patient reports access/transportation concerns to their pharmacy: No  Patient reports adherence concerns with their medications:  No     Diabetes:  Current medications:  Rybelsus 14 mg daily Confirms taking ?30 minutes before the first food, beverage, or other oral medications of the day with ?4 oz of plain water  Insulin glargine  ([name]/Lantus) 36 units QHS  Medications tried in the past:  Trulicity (GI intolerance) Metformin (unable to tolerate - GI side effects) Bydureon (difficulty with device/administration) Invokana (felt dehydrated)  Current glucose readings:   Morning fasting Notes  27 - September 151* *Reports overeating at supper  28 - September -   29 - September -   30 - September 97   1 - October -   2 - October 110   3 - October 197* *Snacking on cheese puffs night prior  4 - October 109     Patient denies hypoglycemic s/sx including dizziness, shakiness, sweating  Encourage patient to obtain and carry glucose tablets Current meal patterns:  - Breakfast: scrambled eggs and sausage or piece of toast - Lunch hamburger or sometime just snacks - Supper beef tips and rice or baked chicken/fish and green beans - Snacks: Nabs - Drinks water and diet Coke (3 cans/day)  Reports working on positive dietary changes: - eating smaller portions at meals, spreading meals thorough out the day - increasing water intake, rather than drinking diet soda  Current physical activity: stays active throughout the day working  Discuss benefits of continuous blood glucose monitor, but patient declined interest in CGM at this time  Current medication access support:  -Patient APPROVED for patient assistance for [name] from [place] through through 03/27/2022 -Patient APPROVED to receive Rybelsus from [employer] patient assistance program through 03/27/22  Hypertension:  Current medications:  Carvedilol 12.5 mg twice daily Lisinopril 40 mg daily  Imdur 30 mg -1/2 tablet (15 mg) daily  Patient has an automated, upper arm home BP cuff Current blood pressure readings readings: today: 145/81  Current physical activity: stays active  throughout the day working  Hyperlipidemia/hx of CAD/NSTEMI:  Current lipid lowering medications:  Ezetimibe 10 mg daily - reports currently taking every other  day  Previous therapies tried: atorvastatin 40 mg daily & 10 mg daily (unable to tolerate due to myalgia  Have discussed importance of limiting saturated fats and trans fats in diet  Antiplatelet regimen: Aspirin 81 mg daily Clopidogrel 75 mg daily  Current physical activity: stays active throughout the day working   Health Maintenance  Health Maintenance Due  Topic Date Due   Zoster Vaccines- Shingrix (1 of 2) Never done   FOOT EXAM  11/10/2021     Objective: Lab Results  Component Value Date   HGBA1C 7.9 (H) 08/05/2021    Lab Results  Component Value Date   CREATININE 1.71 (H) 11/25/2021   BUN 21 11/25/2021   NA 136 11/25/2021   K 4.2 11/25/2021   CL 102 11/25/2021   CO2 22 11/25/2021    Lab Results  Component Value Date   CHOL 153 08/05/2021   HDL 32 (L) 08/05/2021   LDLCALC 91 08/05/2021   TRIG 198 (H) 08/05/2021   CHOLHDL 4.8 08/05/2021   BP Readings from Last 3 Encounters:  11/26/21 123/60  11/25/21 (!) 120/57  11/24/21 (!) 146/76   Pulse Readings from Last 3 Encounters:  11/26/21 82  11/25/21 79  11/24/21 96    Medications Reviewed Today     Reviewed by Orinda Kenner, CMA (Certified Medical Assistant) on 11/26/21 at [place] List Status: <None>   Medication Order Taking? Sig Documenting Provider Last Dose Status Informant  ACCU-CHEK SMARTVIEW test strip [id number] Yes TEST BLOOD SUGAR TWICE DAILY [name] [name], DO Taking Active Spouse/Significant Other, Pharmacy Records  acetaminophen (TYLENOL) 500 MG tablet [id number] Yes Take 1,000 mg by mouth every 8 (eight) hours as needed.  [provider] Taking Active Spouse/Significant Other, Pharmacy Records  allopurinol (ZYLOPRIM) 100 MG tablet [id number] Yes TAKE 1 TABLET AT BEDTIME [name], DO Taking Active Spouse/Significant Other, Pharmacy Records  amLODipine (NORVASC) 2.5 MG tablet [id number] Yes Take 1 tablet by mouth daily. [provider] Taking  Active   aspirin EC 81 MG EC tablet [id number] Yes Take 1 tablet (81 mg total) by mouth daily. Swallow whole. [name], MD Taking Active Spouse/Significant Other, Pharmacy Records  carvedilol (COREG) 12.5 MG tablet [id number] Yes TAKE 1 TABLET TWICE DAILY Karamalegos, [name], DO Taking Active Spouse/Significant Other, Pharmacy Records  clopidogrel (PLAVIX) 75 MG tablet [id number] Yes Take 1 tablet (75 mg total) by mouth daily. [name], MD Taking Active   DROPLET PEN NEEDLES 32G X 4 MM MISC [id number] Yes USE WITH [name] INSULIN INJECTION DAILY AS INSTRUCTED [name] [name], DO Taking Active Spouse/Significant Other, Pharmacy Records  ezetimibe (ZETIA) 10 MG tablet [id number] Yes TAKE 1 TABLET EVERY DAY Karamalegos, [name], DO Taking Active Spouse/Significant Other, Pharmacy Records  fluticasone (FLONASE) 50 MCG/ACT nasal spray [id number] Yes USE 2 SPRAYS IN EACH NOSTRIL ONCE DAILY USE FOR 4 TO 6 WEEKS, THEN STOP AND USE SEASONALLY OR AS NEEDED. [name], DO Taking Active Spouse/Significant Other, Pharmacy Records  Insulin Glargine [facility]e Liter [facility] Boyd) [id number] Yes Inject 36 Units into the skin at bedtime. [provider] Taking Active Spouse/Significant Other, Pharmacy Records           Med Note Curley Spice, [facility] A   Mon Jul 12, 2021  8:59 AM)    isosorbide mononitrate (IMDUR) 30 MG 24 hr tablet [id number] Yes  Take 0.5 tablets (15 mg total) by mouth daily. Debbe Odea, MD Taking Active   lisinopril (ZESTRIL) 40 MG tablet [id number] Yes TAKE 1 TABLET EVERY DAY Karamalegos, Netta Neat, DO Taking Active Spouse/Significant Other, Pharmacy Records  nitrofurantoin, macrocrystal-monohydrate, (MACROBID) 100 MG capsule [id number] Yes Take 1 capsule (100 mg total) by mouth 2 (two) times daily. Lorre Munroe, NP Taking Active   nitroGLYCERIN (NITROSTAT) 0.4 MG SL tablet [id number] Yes Place 1 tablet (0.4 mg total) under the tongue every 5 (five)  minutes as needed for chest pain. Alford Highland, MD Taking Active Spouse/Significant Other, Pharmacy Records           Med Note [facility] [place], Charmayne Sheer Nov 08, 2021 10:13 AM)    pantoprazole (PROTONIX) 20 MG tablet [id number] Yes Take 1 tablet (20 mg total) by mouth 2 (two) times daily before a meal. Althea Charon, Netta Neat, DO Taking Active Spouse/Significant Other, Pharmacy Records  RYBELSUS 14 MG TABS [id number] Yes Take 1 tablet (14 mg total) by mouth daily before breakfast. [name], DO Taking Active Spouse/Significant Other, Pharmacy Records           Med Note Ronney Asters, [facility] A   Wed Oct 13, 2021  8:56 AM)                Assessment/Plan:   Diabetes: - Currently uncontrolled - Reviewed long term cardiovascular and renal outcomes of uncontrolled blood sugar - Reviewed goal A1c, goal fasting, and goal 2 hour post prandial glucose - Reviewed dietary modifications including: - have well-balanced meals throughout the day while controlling carbohydrate portion sizes and limitingnighttime snacking - increase intake of non-starchy vegetables - Recommend to monitor home blood sugar, keep log of results and have this record to review during appointments, but to contact office or CM Pharmacist sooner for readings outside of established parameters or for symptoms Encouraged patient to use glucometer to check blood sugar 2 hours after meals (breakfast, lunch or supper) a couple of times/week to use as feedback on impact of dietary choices on blood sugar   Hypertension: - Reviewed long term cardiovascular and renal outcomes of uncontrolled blood pressure - Reviewed appropriate blood pressure monitoring technique and reviewed goal blood pressure. Recommended to check home blood pressure and heart rate and keep a log of the results   Follow Up Plan: Clinic Pharmacist will follow up with patient again by telephone on 01/31/2022 at 8:30 am  Estelle Grumbles,  PharmD, [place]y [place], CPP Clinical Pharmacist [facility] [phone]

## 2022-01-15 ENCOUNTER — Other Ambulatory Visit: Payer: Self-pay | Admitting: Family Medicine

## 2022-01-15 DIAGNOSIS — E1169 Type 2 diabetes mellitus with other specified complication: Secondary | ICD-10-CM

## 2022-01-17 DIAGNOSIS — R8271 Bacteriuria: Secondary | ICD-10-CM | POA: Diagnosis not present

## 2022-01-17 NOTE — Telephone Encounter (Signed)
Requested Prescriptions  Pending Prescriptions Disp Refills  . DROPLET PEN NEEDLES 32G X 4 MM MISC [Pharmacy Med Name: DROPLET PEN NEEDLES 32GX4MM 32G X 4 MM] 100 each 1    Sig: USE WITH [name] INSULIN INJECTION DAILY AS INSTRUCTED     Endocrinology: Diabetes - Testing Supplies Passed - 01/15/2022  4:05 AM      Passed - Valid encounter within last 12 months    Recent Outpatient Visits          2 weeks ago Type 2 diabetes mellitus with other specified complication, with long-term current use of insulin ([place])   [facility] [place], Virl Diamond, RPH-CPP   1 month ago [facility] Mecum, Dani Gobble, [place]   1 month ago [place], Dani Gobble, [place]   1 month ago [facility] [place], Coralie Keens, NP   4 months ago E coli bacteremia   [facility] [name], [name], DO      Future Appointments            In 1 month  [facility], [place]   In 1 month [name], [name], [facility], [facility]

## 2022-01-20 ENCOUNTER — Other Ambulatory Visit: Payer: Self-pay | Admitting: Family Medicine

## 2022-01-20 ENCOUNTER — Encounter: Payer: Self-pay | Admitting: Family Medicine

## 2022-01-20 ENCOUNTER — Other Ambulatory Visit: Payer: Self-pay

## 2022-01-20 ENCOUNTER — Other Ambulatory Visit: Payer: Self-pay | Admitting: Cardiology

## 2022-01-20 DIAGNOSIS — M1000 Idiopathic gout, unspecified site: Secondary | ICD-10-CM

## 2022-01-20 DIAGNOSIS — I2510 Atherosclerotic heart disease of native coronary artery without angina pectoris: Secondary | ICD-10-CM

## 2022-01-20 DIAGNOSIS — I10 Essential (primary) hypertension: Secondary | ICD-10-CM

## 2022-01-20 DIAGNOSIS — K219 Gastro-esophageal reflux disease without esophagitis: Secondary | ICD-10-CM

## 2022-01-20 MED ORDER — ISOSORBIDE MONONITRATE ER 30 MG PO TB24: 15.0000 mg | ORAL_TABLET | Freq: Every day | ORAL | 3 refills | Status: DC

## 2022-01-20 MED ORDER — CLOPIDOGREL BISULFATE 75 MG PO TABS: 75.0000 mg | ORAL_TABLET | Freq: Every day | ORAL | 3 refills | Status: DC

## 2022-01-21 ENCOUNTER — Other Ambulatory Visit: Payer: Self-pay | Admitting: Urology

## 2022-01-21 DIAGNOSIS — R8271 Bacteriuria: Secondary | ICD-10-CM

## 2022-01-21 DIAGNOSIS — Z87440 Personal history of urinary (tract) infections: Secondary | ICD-10-CM

## 2022-01-24 ENCOUNTER — Other Ambulatory Visit: Payer: Medicare HMO

## 2022-01-27 ENCOUNTER — Other Ambulatory Visit: Payer: Medicare HMO

## 2022-01-31 ENCOUNTER — Ambulatory Visit: Payer: Medicare HMO | Admitting: Pharmacist

## 2022-01-31 DIAGNOSIS — I129 Hypertensive chronic kidney disease with stage 1 through stage 4 chronic kidney disease, or unspecified chronic kidney disease: Secondary | ICD-10-CM

## 2022-01-31 DIAGNOSIS — E1169 Type 2 diabetes mellitus with other specified complication: Secondary | ICD-10-CM

## 2022-01-31 NOTE — Chronic Care Management (AMB) (Signed)
01/31/2022 Name: Masaichi Kracht MRN: [id number] DOB: [birth year]  Chief Complaint  Patient presents with   Medication Management   Medication Assistance    Stevie Charter is a 71 y.o. year old male who presented for a telephone visit.   They were referred to the pharmacist by their PCP for assistance in managing diabetes, hypertension, and medication access.    Subjective:  Today patient reports continues to take cefadroxil as prescribed by Nephrologist. Reports was seen by [facility] and now scheduled by Urologist for CT scan on 11/9 and then for follow up appointment in their office on 11/13  Care Team: Primary Care Provider: [name], DO ; Next Scheduled Visit: 02/25/2022 Cardiologist: Debbe Odea, MD Nephrologist: Mosetta Pigeon, MD  ; Next Scheduled Visit: 06/12/2021 Urology: [facility]; Next Scheduled Visits: 02/03/2022 (CT scan) and 02/07/2022 (Office Visit)  Medication Access/Adherence  Current Pharmacy:  [facility] [address], [place] - [address] [place] [address] Phone: [phone] Fax: [phone]  [facility] - [place], [place] - [address] [place] [address] Phone: [phone] Fax: [phone]   Patient reports affordability concerns with their medications: No  Patient reports access/transportation concerns to their pharmacy: No  Patient reports adherence concerns with their medications:  No       Diabetes:   Current medications:  Rybelsus 14 mg daily Taking ?30 minutes before the first food, beverage, or other oral medications of the day with ?4 oz of plain water  Insulin glargine ([name]/Lantus) 37 units QHS   Medications tried in the past:  Trulicity (GI intolerance) Metformin (unable to tolerate - GI side effects) Bydureon (difficulty with device/administration) Invokana (felt dehydrated)   Current glucose  readings:   Morning Fasting 1 - hour After Supper Notes  29 - October 145    30 - October 145    31 - October -    1 - November -    2 - November -    3 - November - 179 *Snacks night before  4 - November 102    5 - November 104    6 - November 143  *Spaghetti last night     Patient denies hypoglycemic s/sx including dizziness, shakiness, sweating             Again encourage patient to obtain and carry glucose tablets   Reports working on positive dietary changes: - eating smaller portions at meals, spreading meals thorough out the day - increasing water intake, rather than drinking diet soda   Current physical activity: stays active throughout the day working   Discuss benefits of continuous blood glucose monitor, but patient declined interest in CGM at this time   Current medication access support:  -Patient APPROVED for patient assistance for [name] from [place] through through 03/27/2022 -Patient APPROVED to receive Rybelsus from [employer] patient assistance program through 03/27/22   Hypertension:   Current medications:  Carvedilol 12.5 mg twice daily Lisinopril 40 mg daily  Imdur 30 mg -1/2 tablet (15 mg) daily   Patient has an automated, upper arm home BP cuff Current blood pressure readings readings: today: 140/74, HR 63  Denies symptoms of hypotension   Current physical activity: stays active throughout the day working   Hyperlipidemia/hx of CAD/NSTEMI:   Current lipid lowering medications:  Ezetimibe 10 mg daily - reports currently taking every other day   Previous therapies tried: atorvastatin 40 mg daily &  10 mg daily (unable to tolerate due to myalgia   Have discussed importance of limiting saturated fats and trans fats in diet   Antiplatelet regimen: Aspirin 81 mg daily Clopidogrel 75 mg daily   Current physical activity: stays active throughout the day working   Health Maintenance  Health Maintenance Due  Topic Date Due   Zoster  Vaccines- Shingrix (1 of 2) Never done   Pneumonia Vaccine 2+ Years old (1 - PCV) Never done   FOOT EXAM  11/10/2021   Medicare Annual Wellness (AWV)  01/26/2022     Objective: Lab Results  Component Value Date   HGBA1C 7.9 (H) 08/05/2021    Lab Results  Component Value Date   CREATININE 1.71 (H) 11/25/2021   BUN 21 11/25/2021   NA 136 11/25/2021   K 4.2 11/25/2021   CL 102 11/25/2021   CO2 22 11/25/2021    Lab Results  Component Value Date   CHOL 153 08/05/2021   HDL 32 (L) 08/05/2021   LDLCALC 91 08/05/2021   TRIG 198 (H) 08/05/2021   CHOLHDL 4.8 08/05/2021    Medications Reviewed Today     Reviewed by Manuela Neptune, RPH-CPP (Pharmacist) on 12/29/21 at 1515  Med List Status: <None>   Medication Order Taking? Sig Documenting Provider Last Dose Status Informant  ACCU-CHEK SMARTVIEW test strip [id number]  TEST BLOOD SUGAR TWO TIMES DAILY [name], DO  Active   acetaminophen (TYLENOL) 500 MG tablet [id number]  Take 1,000 mg by mouth every 8 (eight) hours as needed.  [provider]  Active Spouse/Significant Other, Pharmacy Records  allopurinol (ZYLOPRIM) 100 MG tablet [id number] Yes TAKE 1 TABLET AT BEDTIME [name], DO Taking Active Spouse/Significant Other, Pharmacy Records  aspirin EC 81 MG EC tablet [id number] Yes Take 1 tablet (81 mg total) by mouth daily. Swallow whole. Alford Highland, MD Taking Active Spouse/Significant Other, Pharmacy Records  carvedilol (COREG) 12.5 MG tablet [id number] Yes TAKE 1 TABLET TWICE DAILY Karamalegos, Netta Neat, DO Taking Active Spouse/Significant Other, Pharmacy Records  cefadroxil (DURICEF) 500 MG capsule [id number] Yes Take 1 capsule (500 mg total) by mouth 2 (two) times daily. [name], DO Taking Active   clopidogrel (PLAVIX) 75 MG tablet [id number] Yes Take 1 tablet (75 mg total) by mouth daily. Debbe Odea, MD Taking Active   DROPLET PEN NEEDLES 32G X 4 MM MISC  [id number]  USE WITH [name] INSULIN INJECTION DAILY AS INSTRUCTED Althea Charon Netta Neat, DO  Active Spouse/Significant Other, Pharmacy Records  ezetimibe (ZETIA) 10 MG tablet [id number] Yes TAKE 1 TABLET EVERY DAY Karamalegos, Netta Neat, DO Taking Active Spouse/Significant Other, Pharmacy Records  fluticasone (FLONASE) 50 MCG/ACT nasal spray [id number]  USE 2 SPRAYS IN EACH NOSTRIL ONCE DAILY - USE FOR 4 TO 6 WEEKS, THEN STOP AND USE SEASONALLY OR AS NEEDED. [name], DO  Active   Insulin Glargine Mariella Saa [facility]) [id number] Yes Inject 36 Units into the skin at bedtime. [provider] Taking Active Spouse/Significant Other, Pharmacy Records           Med Note Ronney Asters, [facility] A   Mon Jul 12, 2021  8:59 AM)    isosorbide mononitrate (IMDUR) 30 MG 24 hr tablet [id number] Yes Take 0.5 tablets (15 mg total) by mouth daily. Debbe Odea, MD Taking Active   lisinopril (ZESTRIL) 40 MG tablet [id number] Yes TAKE 1 TABLET EVERY DAY Karamalegos, Netta Neat, DO Taking Active Spouse/Significant Other, Pharmacy Records  nitroGLYCERIN (NITROSTAT) 0.4 MG SL tablet [id number]  Place 1 tablet (0.4 mg total) under the tongue every 5 (five) minutes as needed for chest pain. [name], MD  Active Spouse/Significant Other, Pharmacy Records           Med Note [facility] [place], Jill Alexanders Nov 08, 2021 10:13 AM)    pantoprazole (PROTONIX) 20 MG tablet [id number] Yes Take 1 tablet (20 mg total) by mouth 2 (two) times daily before a meal. [name], [name], DO Taking Active Spouse/Significant Other, Pharmacy Records  RYBELSUS 14 MG TABS [id number] Yes Take 1 tablet (14 mg total) by mouth daily before breakfast. [name], DO Taking Active Spouse/Significant Other, Pharmacy Records           Med Note Curley Spice, [facility] A   Wed Oct 13, 2021  8:56 AM)                Assessment/Plan:   Diabetes: - Currently uncontrolled - Reviewed long term  cardiovascular and renal outcomes of uncontrolled blood sugar - Reviewed goal A1c, goal fasting, and goal 2 hour post prandial glucose - Reviewed dietary modifications including: - have well-balanced meals throughout the day while controlling carbohydrate portion sizes and limiting nighttime snacking - Recommend to monitor home blood sugar, keep log of results and have this record to review during appointments, but to contact office or CM Pharmacist sooner for readings outside of established parameters or for symptoms Encouraged patient to use glucometer to check blood sugar 2 hours after meals (breakfast, lunch or supper) a couple of times/week to use as feedback on impact of dietary choices on blood sugar - Meets financial criteria for re-enrollment patient assistance program from [place] from [place] from [employer]. Will collaborate with provider, CPhT, and patient to pursue assistance.    Hypertension: - Reviewed long term cardiovascular and renal outcomes of uncontrolled blood pressure - Reviewed appropriate blood pressure monitoring technique and reviewed goal blood pressure. Recommended to check home blood pressure and heart rate and keep a log of the results    Follow Up Plan: Clinical Pharmacist will follow up with patient by telephone on 04/01/2022 at 8:30 am  Wallace Cullens, PharmD, Para March, [facility] [place] [phone]

## 2022-01-31 NOTE — Patient Instructions (Signed)
Goals Addressed             This Visit's Progress    Pharmacy - Patient Goals       Our goal A1c is less than 7%. This corresponds with fasting sugars less than 130 and 2 hour after meal sugars less than 180. Please check your fasting blood sugar each morning and keep a record of the results  Please check your blood pressure and keep record of the results  Feel free to call me with any questions or concerns. I look forward to our next call   Olyver Hawes Antaeus Karel, PharmD, BCACP Clinical Pharmacist [facility] [place] [phone]          

## 2022-02-03 ENCOUNTER — Ambulatory Visit
Admission: RE | Admit: 2022-02-03 | Discharge: 2022-02-03 | Disposition: A | Discharge status: Home / Self Care | Payer: Medicare HMO | Source: Ambulatory Visit | Attending: Urology | Admitting: Urology

## 2022-02-03 DIAGNOSIS — N2889 Other specified disorders of kidney and ureter: Secondary | ICD-10-CM | POA: Diagnosis not present

## 2022-02-03 DIAGNOSIS — Z87440 Personal history of urinary (tract) infections: Secondary | ICD-10-CM

## 2022-02-03 DIAGNOSIS — I700 Atherosclerosis of aorta: Secondary | ICD-10-CM | POA: Diagnosis not present

## 2022-02-03 DIAGNOSIS — R8271 Bacteriuria: Secondary | ICD-10-CM

## 2022-02-07 ENCOUNTER — Telehealth: Payer: Self-pay | Admitting: Pharmacy Technician

## 2022-02-07 DIAGNOSIS — Z87440 Personal history of urinary (tract) infections: Secondary | ICD-10-CM | POA: Diagnosis not present

## 2022-02-07 DIAGNOSIS — N281 Cyst of kidney, acquired: Secondary | ICD-10-CM | POA: Diagnosis not present

## 2022-02-07 DIAGNOSIS — Z596 Low income: Secondary | ICD-10-CM

## 2022-02-07 NOTE — Progress Notes (Signed)
[facility] Customer service manager [facility])                                            [facility] Quality Pharmacy Team    02/07/2022  Gemayel Mascio 11-04-1950 [id number]                                      Medication Assistance Referral-FOR 2024 RE ENROLLMENT  Referral From: [facility] Embedded RPh Vallery Sa   Medication/Company: Rybelsus / [employer] Patient application portion:  Mining engineer portion: Faxed  to Dr. Saralyn Pilar Provider address/fax verified via: Office website  Medication/Company: Edmonia James Patient application portion:  Mailed Provider application portion: Faxed  to Dr. Saralyn Pilar Provider address/fax verified via: Office website  Sheccid Lahmann P. Isabellamarie Randa, CPhT [facility] [employer]  [phone]

## 2022-02-16 ENCOUNTER — Other Ambulatory Visit: Payer: Self-pay

## 2022-02-16 DIAGNOSIS — M1000 Idiopathic gout, unspecified site: Secondary | ICD-10-CM

## 2022-02-16 DIAGNOSIS — N183 Chronic kidney disease, stage 3 unspecified: Secondary | ICD-10-CM

## 2022-02-16 DIAGNOSIS — Z0000 Encounter for general adult medical examination without abnormal findings: Secondary | ICD-10-CM

## 2022-02-16 DIAGNOSIS — E1169 Type 2 diabetes mellitus with other specified complication: Secondary | ICD-10-CM

## 2022-02-16 DIAGNOSIS — R972 Elevated prostate specific antigen [PSA]: Secondary | ICD-10-CM

## 2022-02-21 ENCOUNTER — Other Ambulatory Visit: Payer: Medicare HMO

## 2022-02-21 DIAGNOSIS — I129 Hypertensive chronic kidney disease with stage 1 through stage 4 chronic kidney disease, or unspecified chronic kidney disease: Secondary | ICD-10-CM | POA: Diagnosis not present

## 2022-02-21 DIAGNOSIS — M1000 Idiopathic gout, unspecified site: Secondary | ICD-10-CM | POA: Diagnosis not present

## 2022-02-21 DIAGNOSIS — E785 Hyperlipidemia, unspecified: Secondary | ICD-10-CM | POA: Diagnosis not present

## 2022-02-21 DIAGNOSIS — N183 Chronic kidney disease, stage 3 unspecified: Secondary | ICD-10-CM | POA: Diagnosis not present

## 2022-02-21 DIAGNOSIS — R972 Elevated prostate specific antigen [PSA]: Secondary | ICD-10-CM | POA: Diagnosis not present

## 2022-02-21 DIAGNOSIS — E1169 Type 2 diabetes mellitus with other specified complication: Secondary | ICD-10-CM | POA: Diagnosis not present

## 2022-02-21 DIAGNOSIS — Z794 Long term (current) use of insulin: Secondary | ICD-10-CM | POA: Diagnosis not present

## 2022-02-21 DIAGNOSIS — Z0000 Encounter for general adult medical examination without abnormal findings: Secondary | ICD-10-CM | POA: Diagnosis not present

## 2022-02-22 LAB — HEMOGLOBIN A1C
Hgb A1c MFr Bld: 7.8 % of total Hgb — ABNORMAL HIGH (ref <5.7)
Mean Plasma Glucose: 177 mg/dL
eAG (mmol/L): 9.8 mmol/L

## 2022-02-22 LAB — COMPLETE METABOLIC PANEL WITH GFR
AG Ratio: 1.3 (calc) (ref 1.0–2.5)
ALT: 26 U/L (ref 9–46)
AST: 27 U/L (ref 10–35)
Albumin: 4 g/dL (ref 3.6–5.1)
Alkaline phosphatase (APISO): 71 U/L (ref 35–144)
BUN/Creatinine Ratio: 14 (calc) (ref 6–22)
BUN: 20 mg/dL (ref 7–25)
CO2: 28 mmol/L (ref 20–32)
Calcium: 9 mg/dL (ref 8.6–10.3)
Chloride: 103 mmol/L (ref 98–110)
Creat: 1.46 mg/dL — ABNORMAL HIGH (ref 0.70–1.28)
Globulin: 3 g/dL (calc) (ref 1.9–3.7)
Glucose, Bld: 233 mg/dL — ABNORMAL HIGH (ref 65–99)
Potassium: 4.6 mmol/L (ref 3.5–5.3)
Sodium: 137 mmol/L (ref 135–146)
Total Bilirubin: 0.3 mg/dL (ref 0.2–1.2)
Total Protein: 7 g/dL (ref 6.1–8.1)
eGFR: 51 mL/min/{1.73_m2} — ABNORMAL LOW (ref >=60)

## 2022-02-22 LAB — CBC WITH DIFFERENTIAL/PLATELET
Absolute Monocytes: 465 cells/uL (ref 200–950)
Basophils Absolute: 50 cells/uL (ref 0–200)
Basophils Relative: 0.8 %
Eosinophils Absolute: 192 cells/uL (ref 15–500)
Eosinophils Relative: 3.1 %
HCT: 39.3 % (ref 38.5–50.0)
Hemoglobin: 13 g/dL — ABNORMAL LOW (ref 13.2–17.1)
Lymphs Abs: 1581 cells/uL (ref 850–3900)
MCH: 29.7 pg (ref 27.0–33.0)
MCHC: 33.1 g/dL (ref 32.0–36.0)
MCV: 89.7 fL (ref 80.0–100.0)
MPV: 9.7 fL (ref 7.5–12.5)
Monocytes Relative: 7.5 %
Neutro Abs: 3912 cells/uL (ref 1500–7800)
Neutrophils Relative %: 63.1 %
Platelets: 246 10*3/uL (ref 140–400)
RBC: 4.38 10*6/uL (ref 4.20–5.80)
RDW: 14.1 % (ref 11.0–15.0)
Total Lymphocyte: 25.5 %
WBC: 6.2 10*3/uL (ref 3.8–10.8)

## 2022-02-22 LAB — LIPID PANEL
Cholesterol: 149 mg/dL (ref <200)
HDL: 37 mg/dL — ABNORMAL LOW (ref >=40)
LDL Cholesterol (Calc): 86 mg/dL (calc)
Non-HDL Cholesterol (Calc): 112 mg/dL (calc) (ref <130)
Total CHOL/HDL Ratio: 4 (calc) (ref <5.0)
Triglycerides: 160 mg/dL — ABNORMAL HIGH (ref <150)

## 2022-02-22 LAB — PSA: PSA: 7.9 ng/mL — ABNORMAL HIGH (ref <=4.00)

## 2022-02-22 LAB — URIC ACID: Uric Acid, Serum: 5.7 mg/dL (ref 4.0–8.0)

## 2022-02-22 LAB — TSH: TSH: 4.08 mIU/L (ref 0.40–4.50)

## 2022-02-25 ENCOUNTER — Encounter: Payer: Medicare HMO | Admitting: Family Medicine

## 2022-02-25 ENCOUNTER — Ambulatory Visit: Payer: Medicare HMO

## 2022-02-26 ENCOUNTER — Other Ambulatory Visit: Payer: Self-pay | Admitting: Family Medicine

## 2022-02-26 DIAGNOSIS — Z794 Long term (current) use of insulin: Secondary | ICD-10-CM

## 2022-02-28 NOTE — Telephone Encounter (Signed)
Requested medication (s) are due for refill today: routing for review.  Requested medication (s) are on the active medication list: yes  Last refill:    Future visit scheduled: yes  Notes to clinic:  Unable to refill per protocol, last refill by another provider. Historical provider.     Requested Prescriptions  Pending Prescriptions Disp Refills   Insulin Glargine ([name] KWIKPEN) 100 UNIT/ML [Pharmacy Med Name: [name] KwikPen Subcutaneous Solution Pen-injector 100 UNIT/ML] 45 mL 0    Sig: DIAL AND INJECT 34 UNITS UNDER THE SKIN ONCE DAILY AT BEDTIME. ( MAX DAILY DOSE IS 34 UNITS)     Endocrinology:  Diabetes - Insulins Passed - 02/26/2022  7:24 AM      Passed - HBA1C is between 0 and 7.9 and within 180 days    Hemoglobin A1C  Date Value Ref Range Status  01/11/2019 7.8  Final   Hgb A1c MFr Bld  Date Value Ref Range Status  02/21/2022 7.8 (H) <5.7 % of total Hgb Final    Comment:    For someone without known diabetes, a hemoglobin A1c value of 6.5% or greater indicates that they may have  diabetes and this should be confirmed with a follow-up  test. . For someone with known diabetes, a value <7% indicates  that their diabetes is well controlled and a value  greater than or equal to 7% indicates suboptimal  control. A1c targets should be individualized based on  duration of diabetes, age, comorbid conditions, and  other considerations. . Currently, no consensus exists regarding use of hemoglobin A1c for diagnosis of diabetes for children. Verna Czech - Valid encounter within last 6 months    Recent Outpatient Visits           4 weeks ago Type 2 diabetes mellitus with other specified complication, with long-term current use of insulin (HCC)   [facility] Delles, Gentry Fitz A, RPH-CPP   2 months ago Type 2 diabetes mellitus with other specified complication, with long-term current use of insulin (HCC)   [facility] Delles,  Jackelyn Poling, RPH-CPP   3 months ago Dysuria   [facility] Mecum, Oswaldo Conroy, [place]   3 months ago Chills   [facility] Mecum, Oswaldo Conroy, [place]   3 months ago Dysuria   [facility] [place], Salvadore [place], NP       Future Appointments             In 2 days Althea Charon, Netta Neat, DO [facility]   In 4 days  [facility], [facility]

## 2022-03-02 ENCOUNTER — Encounter: Payer: Self-pay | Admitting: Family Medicine

## 2022-03-02 ENCOUNTER — Ambulatory Visit (INDEPENDENT_AMBULATORY_CARE_PROVIDER_SITE_OTHER): Payer: Medicare HMO | Admitting: Family Medicine

## 2022-03-02 VITALS — BP 118/74 | HR 85 | Ht 71.0 in | Wt 234.0 lb

## 2022-03-02 DIAGNOSIS — E669 Obesity, unspecified: Secondary | ICD-10-CM | POA: Diagnosis not present

## 2022-03-02 DIAGNOSIS — I2510 Atherosclerotic heart disease of native coronary artery without angina pectoris: Secondary | ICD-10-CM | POA: Diagnosis not present

## 2022-03-02 DIAGNOSIS — M1000 Idiopathic gout, unspecified site: Secondary | ICD-10-CM | POA: Diagnosis not present

## 2022-03-02 DIAGNOSIS — Z794 Long term (current) use of insulin: Secondary | ICD-10-CM

## 2022-03-02 DIAGNOSIS — I129 Hypertensive chronic kidney disease with stage 1 through stage 4 chronic kidney disease, or unspecified chronic kidney disease: Secondary | ICD-10-CM | POA: Diagnosis not present

## 2022-03-02 DIAGNOSIS — E785 Hyperlipidemia, unspecified: Secondary | ICD-10-CM

## 2022-03-02 DIAGNOSIS — E1122 Type 2 diabetes mellitus with diabetic chronic kidney disease: Secondary | ICD-10-CM

## 2022-03-02 DIAGNOSIS — E1169 Type 2 diabetes mellitus with other specified complication: Secondary | ICD-10-CM

## 2022-03-02 DIAGNOSIS — Z0000 Encounter for general adult medical examination without abnormal findings: Secondary | ICD-10-CM

## 2022-03-02 DIAGNOSIS — N183 Chronic kidney disease, stage 3 unspecified: Secondary | ICD-10-CM

## 2022-03-02 DIAGNOSIS — G720 Drug-induced myopathy: Secondary | ICD-10-CM

## 2022-03-02 DIAGNOSIS — Z9861 Coronary angioplasty status: Secondary | ICD-10-CM

## 2022-03-02 NOTE — Patient Instructions (Addendum)
Thank you for coming to the office today.  HOLD Zetia for 2 weeks to see if weakness and muscle symptoms improve.  If the symptoms of weakness persist, then you can resume taking the Zetia and the answer is likely with the spine / nerves of the back.  If you want to try the new cholesterol medication injection - with 0 side effects  Repatha or Praluent Every 2 to 4 weeks depending on the dosage.  Check into cost and coverage if you can.  Recent Labs    08/05/21 0818 02/21/22 0825  HGBA1C 7.9* 7.8*    Please discuss with Urologist in [place] now it is improved.  Component Ref Range & Units 9 d ago 3 mo ago 1 yr ago  PSA < OR = 4.00 ng/mL 7.90 High  26.22 High  CM 6.06 High      Please schedule a Follow-up Appointment to: Return in about 6 months (around 09/01/2022) for 6 month follow-up DM A1c, Urology updates.  If you have any other questions or concerns, please feel free to call the office or send a message through MyChart. You may also schedule an earlier appointment if necessary.  Additionally, you may be receiving a survey about your experience at our office within a few days to 1 week by e-mail or mail. We value your feedback.  Saralyn Pilar, DO [facility], [place]

## 2022-03-02 NOTE — Progress Notes (Unsigned)
Subjective:    Patient ID: [name], male    DOB: [birth year], 71 y.o.   MRN: [id number]  [name] is a 71 y.o. male presenting on 03/02/2022 for Annual Exam   HPI  Here for Annual Physical and Lab Review  CHRONIC DM, Type 2 with nephropathy / CKD-III Last A1c 7.8% Creatinine 1.46 improved from 1.7 to 1.8 previously His CBG readings avg 120-140 Meds: - On Rybelsus 14 mg - On [name] 37 units nightly  - Remains off Metformin Reports  good compliance. Tolerating well w/o side-effects Currently on ACEi Lifestyle: - Diet (improved DM Diet, except now still increased sweets) - Exercise limited Due for DM Eye exam. Denies hypoglycemia, polyuria, visual changes, numbness or tingling.   HYPERLIPIDEMIA Drug Induced Myopathy Off Statin therapy failed due to myopathy Now has leg weakness symptoms again on Zetia Lipids with LDL 86, not at goal < 70 with known CAD  CAD History of MI   Hypertension Controlled on current medication.   Elevated PSA Recurrent UTI Followed by Urology in [place] Prior PSA >20, has improved to 7 now. Previously had Prostate Biopsy 2021 negative but caused sepsis infection On D-Mannose Cranberry, probiotic to reduce UTI  \    Health Maintenance: Due for repeat Cologuard, last was negative 04/2018     03/02/2022   10:34 AM 09/16/2021   10:27 AM 01/26/2021    8:31 AM  Depression screen PHQ 2/9  Decreased Interest 0 0 0  Down, Depressed, Hopeless 0 0 0  PHQ - 2 Score 0 0 0  Altered sleeping 0 0   Tired, decreased energy 0 0   Change in appetite 0 0   Feeling bad or failure about yourself  0 0   Trouble concentrating 0 0   Moving slowly or fidgety/restless 0 0   Suicidal thoughts 0 0   PHQ-9 Score 0 0   Difficult doing work/chores Not difficult at all Not difficult at all     Past Medical History:  Diagnosis Date   Allergy    CAD (coronary artery disease)    a. 01/2019 MV: EF 55-65%, no ischemia; b. 12/2019 NSTEMI/PCI: LM  nl, LAD 40p, 68m D1 40, LCX nl, OM3 40, RCA 90/85p (2.75x34 Resolute Onyx DES), 373m.   Chronic headaches    CKD (chronic kidney disease), stage III (HCC)    Edentulous    no lower teeth   GERD (gastroesophageal reflux disease)    Gout    Heart burn    History of echocardiogram    a. 12/2019 Echo: EF 60-65%, no rwma, triv MR.   Hyperlipidemia    Hypertension    Motion sickness    boats   [place]I (non-ST elevated myocardial infarction) ([place]   Pulmonary embolism ([facility]   [facility]   Past Surgical History:  Procedure Laterality Date   BACK SURGERY  02/26/2019   CATARACT EXTRACTION W/PHACO Left 04/14/2021   Procedure: CATARACT EXTRACTION PHACO AND INTRAOCULAR LENS PLACEMENT (IOBlountLEFT DIABETIC maylugin;  Surgeon: BrLeandrew KoyanagiMD;  [place] Op[place];  Laterality: Left;  11.22 1:25.1   CATARACT EXTRACTION W/PHACO Right 06/09/2021   Procedure: CATARACT EXTRACTION PHACO AND INTRAOCULAR LENS PLACEMENT (IOWest KootenaiRIGHT DIABETIC MALYUGIN;  Surgeon: BrLeandrew KoyanagiMD;  [place]: [place]hthal[place];  Laterality: Right;  Diabetic 4.75 01:05.7   CORONARY STENT INTERVENTION N/A 10/26/2021   Procedure: CORONARY STENT INTERVENTION;  Surgeon: EnNelva Bush  MD;  Location: [place] CV LAB;  Service: Cardiovascular;  Laterality: N/A;   INTRAVASCULAR ULTRASOUND/IVUS N/A 10/26/2021   Procedure: Intravascular Ultrasound/IVUS;  Surgeon: Nelva Bush, MD;  Location: [place] [facility] LAB;  Service: Cardiovascular;  Laterality: N/A;   KNEE SURGERY     LEFT HEART CATH AND CORONARY ANGIOGRAPHY N/A 12/30/2019   Procedure: LEFT HEART CATH AND CORONARY ANGIOGRAPHY;  Surgeon: Wellington Hampshire, MD;  Location: [place] CV LAB;  Service: Cardiovascular;  Laterality: N/A;   LEFT HEART CATH AND CORONARY ANGIOGRAPHY N/A 10/26/2021   Procedure: LEFT HEART CATH AND CORONARY ANGIOGRAPHY;  Surgeon:  Nelva Bush, MD;  Location: [place] CV LAB;  Service: Cardiovascular;  Laterality: N/A;   SHOULDER SURGERY     TONSILLECTOMY     Social History   Socioeconomic History   Marital status: Married    Spouse name: Not on file   Number of children: Not on file   Years of education: Not on file   Highest education level: Not on file  Occupational History   Occupation: retired  Tobacco Use   Smoking status: Former    Types: Cigars    Quit date: 2000    Years since quitting: 23.9   Smokeless tobacco: Current    Types: Chew   Tobacco comments:    wife states patient never smoked cigarettes, just chewing tobacco  Vaping Use   Vaping Use: Never used  Substance and Sexual Activity   Alcohol use: Not Currently    Alcohol/week: 0.0 standard drinks of alcohol   Drug use: No   Sexual activity: Yes    Birth control/protection: Inserts  Other Topics Concern   Not on file  Social History Narrative   Not on file   Social Determinants of Health   Financial Resource Strain: Low Risk  (01/26/2021)   Overall Financial Resource Strain (CARDIA)    Difficulty of Paying Living Expenses: Not hard at all  Food Insecurity: No Food Insecurity (01/26/2021)   Hunger Vital Sign    Worried About Running Out of Food in the Last Year: Never true    [place] in the Last Year: Never true  Transportation Needs: No Transportation Needs (01/26/2021)   PRAPARE - Hydrologist (Medical): No    Lack of Transportation (Non-Medical): No  Physical Activity: Inactive (01/26/2021)   Exercise Vital Sign    Days of Exercise per Week: 0 days    Minutes of Exercise per Session: 0 min  Stress: No Stress Concern Present (01/26/2021)   [place]    Feeling of Stress : Not at all  Social Connections: Somewhat Isolated (08/29/2017)   Social Connection and Isolation Panel [NHANES]    Frequency of Communication with  Friends and Family: More than three times a week    Frequency of Social Gatherings with Friends and Family: More than three times a week    Attends Religious Services: Never    Marine scientist or Organizations: No    Attends Archivist Meetings: Never    Marital Status: Married  Human resources officer Violence: Not At Risk (08/29/2017)   Humiliation, Afraid, Rape, and Kick questionnaire    Fear of Current or Ex-Partner: No    Emotionally Abused: No    Physically Abused: No    Sexually Abused: No   Family History  Problem Relation Age of Onset   Cancer Father  lung   Heart disease Sister    Heart disease Brother    Prostate cancer Neg Hx    Current Outpatient Medications on File Prior to Visit  Medication Sig   ACCU-CHEK SMARTVIEW test strip TEST BLOOD SUGAR TWO TIMES DAILY   acetaminophen (TYLENOL) 500 MG tablet Take 1,000 mg by mouth every 8 (eight) hours as needed.    allopurinol (ZYLOPRIM) 100 MG tablet TAKE 1 TABLET AT BEDTIME   aspirin EC 81 MG EC tablet Take 1 tablet (81 mg total) by mouth daily. Swallow whole.   carvedilol (COREG) 12.5 MG tablet TAKE 1 TABLET TWICE DAILY   clopidogrel (PLAVIX) 75 MG tablet Take 1 tablet (75 mg total) by mouth daily.   DROPLET PEN NEEDLES 32G X 4 MM MISC USE WITH [name] INSULIN INJECTION DAILY AS INSTRUCTED   ezetimibe (ZETIA) 10 MG tablet TAKE 1 TABLET EVERY DAY   fluticasone (FLONASE) 50 MCG/ACT nasal spray USE 2 SPRAYS IN EACH NOSTRIL ONCE DAILY - USE FOR 4 TO 6 WEEKS, THEN STOP AND USE SEASONALLY OR AS NEEDED.   Insulin Glargine ([name] KWIKPEN) 100 UNIT/ML DIAL AND INJECT 34 UNITS UNDER THE SKIN ONCE DAILY AT BEDTIME. ( MAX DAILY DOSE IS 34 UNITS)   isosorbide mononitrate (IMDUR) 30 MG 24 hr tablet Take 0.5 tablets (15 mg total) by mouth daily.   lisinopril (ZESTRIL) 40 MG tablet TAKE 1 TABLET EVERY DAY   nitroGLYCERIN (NITROSTAT) 0.4 MG SL tablet Place 1 tablet (0.4 mg total) under the tongue every 5 (five) minutes as  needed for chest pain.   pantoprazole (PROTONIX) 20 MG tablet TAKE 1 TABLET TWICE DAILY BEFORE MEALS (STOP OMEPRAZOLE)   RYBELSUS 14 MG TABS Take 1 tablet (14 mg total) by mouth daily before breakfast.   No current facility-administered medications on file prior to visit.    Review of Systems  Constitutional:  Negative for activity change, appetite change, chills, diaphoresis, fatigue and fever.  HENT:  Negative for congestion and hearing loss.   Eyes:  Negative for visual disturbance.  Respiratory:  Negative for cough, chest tightness, shortness of breath and wheezing.   Cardiovascular:  Negative for chest pain, palpitations and leg swelling.  Gastrointestinal:  Negative for abdominal pain, constipation, diarrhea, nausea and vomiting.  Genitourinary:  Negative for dysuria, frequency and hematuria.  Musculoskeletal:  Negative for arthralgias and neck pain.  Skin:  Negative for rash.  Neurological:  Positive for weakness (legs). Negative for dizziness, light-headedness, numbness and headaches.  Hematological:  Negative for adenopathy.  Psychiatric/Behavioral:  Negative for behavioral problems, dysphoric mood and sleep disturbance.    Per HPI unless specifically indicated above     Objective:    BP 118/74 (BP Location: Left Arm, Cuff Size: Normal)   Pulse 85   Ht _0  (1.803 m)   Wt 234 lb (106.1 kg)   SpO2 98%   BMI 32.64 kg/m   Wt Readings from Last 3 Encounters:  03/02/22 234 lb (106.1 kg)  11/26/21 231 lb (104.8 kg)  11/25/21 231 lb (104.8 kg)    Physical Exam Vitals and nursing note reviewed.  Constitutional:      General: He is not in acute distress.    Appearance: He is well-developed. He is not diaphoretic.     Comments: Well-appearing, comfortable, cooperative  HENT:     Head: Normocephalic and atraumatic.  Eyes:     General:        Right eye: No discharge.  Left eye: No discharge.     Conjunctiva/sclera: Conjunctivae normal.     Pupils: Pupils are  equal, round, and reactive to light.  Neck:     Thyroid: No thyromegaly.  Cardiovascular:     Rate and Rhythm: Normal rate and regular rhythm.     Pulses: Normal pulses.     Heart sounds: Normal heart sounds. No murmur heard. Pulmonary:     Effort: Pulmonary effort is normal. No respiratory distress.     Breath sounds: Normal breath sounds. No wheezing or rales.  Abdominal:     General: Bowel sounds are normal. There is no distension.     Palpations: Abdomen is soft. There is no mass.     Tenderness: There is no abdominal tenderness.  Musculoskeletal:        General: No tenderness. Normal range of motion.     Cervical back: Normal range of motion and neck supple.     Comments: Upper / Lower Extremities: - Normal muscle tone, strength bilateral upper extremities 5/5, lower extremities 5/5  Lymphadenopathy:     Cervical: No cervical adenopathy.  Skin:    General: Skin is warm and dry.     Findings: No erythema or rash.  Neurological:     Mental Status: He is alert and oriented to person, place, and time.     Comments: Distal sensation intact to light touch all extremities  Psychiatric:        Mood and Affect: Mood normal.        Behavior: Behavior normal.        Thought Content: Thought content normal.     Comments: Well groomed, good eye contact, normal speech and thoughts       Results for orders placed or performed in visit on 02/16/22  Uric acid  Result Value Ref Range   Uric Acid, Serum 5.7 4.0 - 8.0 mg/dL  TSH  Result Value Ref Range   TSH 4.08 0.40 - 4.50 mIU/L  PSA  Result Value Ref Range   PSA 7.90 (H) < OR = 4.00 ng/mL  Hemoglobin A1c  Result Value Ref Range   Hgb A1c MFr Bld 7.8 (H) <5.7 % of total Hgb   Mean Plasma Glucose 177 mg/dL   eAG (mmol/L) 9.8 mmol/L  Lipid panel  Result Value Ref Range   Cholesterol 149 <200 mg/dL   HDL 37 (L) > OR = 40 mg/dL   Triglycerides 160 (H) <150 mg/dL   LDL Cholesterol (Calc) 86 mg/dL (calc)   Total CHOL/HDL Ratio  4.0 <5.0 (calc)   Non-HDL Cholesterol (Calc) 112 <130 mg/dL (calc)  CBC with Differential/Platelet  Result Value Ref Range   WBC 6.2 3.8 - 10.8 Thousand/uL   RBC 4.38 4.20 - 5.80 Million/uL   Hemoglobin 13.0 (L) 13.2 - 17.1 g/dL   HCT 39.3 38.5 - 50.0 %   MCV 89.7 80.0 - 100.0 fL   MCH 29.7 27.0 - 33.0 pg   MCHC 33.1 32.0 - 36.0 g/dL   RDW 14.1 11.0 - 15.0 %   Platelets 246 140 - 400 Thousand/uL   MPV 9.7 7.5 - 12.5 fL   Neutro Abs 3,912 1,500 - 7,800 cells/uL   Lymphs Abs 1,581 850 - 3,900 cells/uL   Absolute Monocytes 465 200 - 950 cells/uL   Eosinophils Absolute 192 15 - 500 cells/uL   Basophils Absolute 50 0 - 200 cells/uL   Neutrophils Relative % 63.1 %   Total Lymphocyte 25.5 %   Monocytes Relative  7.5 %   Eosinophils Relative 3.1 %   Basophils Relative 0.8 %  COMPLETE METABOLIC PANEL WITH GFR  Result Value Ref Range   Glucose, Bld 233 (H) 65 - 99 mg/dL   BUN 20 7 - 25 mg/dL   Creat 1.46 (H) 0.70 - 1.28 mg/dL   eGFR 51 (L) > OR = 60 mL/min/1.29m   BUN/Creatinine Ratio 14 6 - 22 (calc)   Sodium 137 135 - 146 mmol/L   Potassium 4.6 3.5 - 5.3 mmol/L   Chloride 103 98 - 110 mmol/L   CO2 28 20 - 32 mmol/L   Calcium 9.0 8.6 - 10.3 mg/dL   Total Protein 7.0 6.1 - 8.1 g/dL   Albumin 4.0 3.6 - 5.1 g/dL   Globulin 3.0 1.9 - 3.7 g/dL (calc)   AG Ratio 1.3 1.0 - 2.5 (calc)   Total Bilirubin 0.3 0.2 - 1.2 mg/dL   Alkaline phosphatase (APISO) 71 35 - 144 U/L   AST 27 10 - 35 U/L   ALT 26 9 - 46 U/L      Assessment & Plan:   Problem List Items Addressed This Visit     CAD S/P percutaneous coronary angioplasty   CKD stage 3 due to type 2 diabetes mellitus (HCC)    Improved Creatinine      Drug-induced myopathy    Secondary to side effect on statin (failed rosuvastatin, crestor, lipitor in past)  Off Statin Now on Zetia but also having myopathy  Offer PCSK9 see below      Gout   Obesity (BMI 30.0-34.9)   Type 2 diabetes mellitus with other specified  complication (HCC)    Improved A1c to 7.8 Improving lifestyle On Rybelsus now oral GLP1 No hypoglycemia Complications - CKD-III nephropathy, other neuropathy from spine but less likely DM neuropathy, other including hyperlipidemia, GERD, obesity - increases risk of future cardiovascular complications  Off Tradjenta, duplicate therapy/cost, Invokana Failed Trulicity 09.20and 11.0OFweekly d/t GI intolerance OFF Metformin due to AKI - and GI intolerance  Plan:  1. Contiue Rybelsus 132mdaily before 1st meal 2. [name] 35+ units Continue working w/ [facility] pharmacy interval between apToysRusmproved lifestyle - low carb, low sugar diet, reduce portion size, continue improving regular exercise Check CBG, bring log to next visit for review Continue ACEi      Other Visit Diagnoses     Annual physical exam    -  Primary   Benign hypertension with CKD (chronic kidney disease) stage III ([facility]      Hyperlipidemia associated with type 2 diabetes mellitus (H[facility]          Updated Health Maintenance information Reviewed recent lab results with patient Encouraged improvement to lifestyle with diet and exercise Goal of weight loss  Drug induced myopathy CAD Goal LDL < 70  HOLD Zetia for 2 weeks to see if weakness and muscle symptoms improve.  If the symptoms of weakness persist, then you can resume taking the Zetia and the answer is likely with the spine / nerves of the back.  Regardless, I would recommend PCSK9 inhibitor therapy Repatha vs Praluent for management of his lipids. Especially if zetia still causing myopathy  Check into cost and coverage if you can.   No orders of the defined types were placed in this encounter.    Follow up plan: Return in about 6 months (around 09/01/2022) for 6 month follow-up DM A1c, Urology updates.  AlNobie PutnamDO [facility]  [facility] 03/02/2022, 11:21 AM

## 2022-03-03 DIAGNOSIS — Z9889 Other specified postprocedural states: Secondary | ICD-10-CM | POA: Insufficient documentation

## 2022-03-03 DIAGNOSIS — K219 Gastro-esophageal reflux disease without esophagitis: Secondary | ICD-10-CM | POA: Insufficient documentation

## 2022-03-03 DIAGNOSIS — M4310 Spondylolisthesis, site unspecified: Secondary | ICD-10-CM | POA: Insufficient documentation

## 2022-03-03 DIAGNOSIS — M48061 Spinal stenosis, lumbar region without neurogenic claudication: Secondary | ICD-10-CM | POA: Insufficient documentation

## 2022-03-03 NOTE — Assessment & Plan Note (Addendum)
Improved A1c to 7.8 Improving lifestyle On Rybelsus now oral GLP1 No hypoglycemia Complications - CKD-III nephropathy, other neuropathy from spine but less likely DM neuropathy, other including hyperlipidemia, GERD, obesity - increases risk of future cardiovascular complications  Off Tradjenta, duplicate therapy/cost, Invokana Failed Trulicity 0.75 and 1.5mg  weekly d/t GI intolerance OFF Metformin due to AKI - and GI intolerance  Plan:  1. Contiue Rybelsus 14mg  daily before 1st meal 2. [name] 35+ units Continue working w/ [facility] pharmacy interval between improved lifestyle - low carb, low sugar diet, reduce portion size, continue improving regular exercise Check CBG, bring log to next visit for review Continue ACEi

## 2022-03-03 NOTE — Assessment & Plan Note (Signed)
Improved Creatinine

## 2022-03-03 NOTE — Progress Notes (Incomplete)
Subjective:    Patient ID: [name], male    DOB: 04/06/1950, 71 y.o.   MRN: [id number]  [name] is a 71 y.o. male presenting on 03/02/2022 for Annual Exam   HPI  Here for Annual Physical and Lab Review  CHRONIC DM, Type 2 with nephropathy / CKD-III Last A1c 7.8% Creatinine 1.46 improved from 1.7 to 1.8 previously His CBG readings avg 120-140 Meds: - On Rybelsus 14 mg - On [name] 37 units nightly  - Remains off Metformin Reports  good compliance. Tolerating well w/o side-effects Currently on ACEi Lifestyle: - Diet (improved DM Diet, except now still increased sweets) - Exercise limited Due for DM Eye exam. Denies hypoglycemia, polyuria, visual changes, numbness or tingling.   HYPERLIPIDEMIA Drug Induced Myopathy Off Statin therapy failed due to myopathy Now has leg weakness symptoms again on Zetia Lipids with LDL 86, not at goal < 70 with known CAD  CAD History of MI   Hypertension Controlled on current medication.   Elevated PSA Recurrent UTI Followed by Urology in [place] Prior PSA >20, has improved to 7 now. Previously had Prostate Biopsy 2021 negative but caused sepsis infection On D-Mannose Cranberry, probiotic to reduce UTI  \    Health Maintenance: Due for repeat Cologuard, last was negative 04/2018     03/02/2022   10:34 AM 09/16/2021   10:27 AM 01/26/2021    8:31 AM  Depression screen PHQ 2/9  Decreased Interest 0 0 0  Down, Depressed, Hopeless 0 0 0  PHQ - 2 Score 0 0 0  Altered sleeping 0 0   Tired, decreased energy 0 0   Change in appetite 0 0   Feeling bad or failure about yourself  0 0   Trouble concentrating 0 0   Moving slowly or fidgety/restless 0 0   Suicidal thoughts 0 0   PHQ-9 Score 0 0   Difficult doing work/chores Not difficult at all Not difficult at all     Past Medical History:  Diagnosis Date  . Allergy   . CAD (coronary artery disease)    a. 01/2019 MV: EF 55-65%, no ischemia; b. 12/2019 NSTEMI/PCI:  LM nl, LAD 40p, 61m D1 40, LCX nl, OM3 40, RCA 90/85p (2.75x34 Resolute Onyx DES), 332m.  . Marland Kitchenhronic headaches   . CKD (chronic kidney disease), stage III (HCMinot AFB  . Edentulous    no lower teeth  . GERD (gastroesophageal reflux disease)   . Gout   . Heart burn   . History of echocardiogram    a. 12/2019 Echo: EF 60-65%, no rwma, triv MR.  . Marland Kitchenyperlipidemia   . Hypertension   . Motion sickness    boats  . NSTEMI (non-ST elevated myocardial infarction) ([facility]/03/2019  . Pulmonary embolism ([facility]CV[place]03/2019  . Type 2 diabetes mellitus ([facility] [place]   Past Surgical History:  Procedure Laterality Date  . BACK SURGERY  02/26/2019  . CATARACT EXTRACTION W/PHACO Left 04/14/2021   Procedure: CATARACT EXTRACTION PHACO AND INTRAOCULAR LENS PLACEMENT (IOMendonLEFT DIABETIC maylugin;  Surgeon: BrLeandrew KoyanagiMD;  Location: [facility]: Ophthalmology;  Laterality: Left;  11.22 1:25.1  . CATARACT EXTRACTION W/PHACO Right 06/09/2021   Procedure: CATARACT EXTRACTION PHACO AND INTRAOCULAR LENS PLACEMENT (IOSt. JosephRIGHT DIABETIC MALYUGIN;  Surgeon: BrLeandrew KoyanagiMD;  [place]: [facility]hthalmology;  Laterality: Right;  Diabetic 4.75 01:05.7  . CORONARY STENT INTERVENTION N/A 10/26/2021   Procedure: CORONARY STENT INTERVENTION;  Surgeon: EnNelva Bush  MD;  Location: [facility];  Service: Cardiovascular;  Laterality: N/A;  . INTRAVASCULAR ULTRASOUND/IVUS N/A 10/26/2021   Procedure: Intravascular Ultrasound/IVUS;  Surgeon: Nelva Bush, MD;  Location: [facility];  Service: Cardiovascular;  Laterality: N/A;  . KNEE SURGERY    . LEFT HEART CATH AND CORONARY ANGIOGRAPHY N/A 12/30/2019   Procedure: LEFT HEART CATH AND CORONARY ANGIOGRAPHY;  Surgeon: Wellington Hampshire, MD;  Location: [facility];  Service: Cardiovascular;  Laterality: N/A;  . LEFT HEART CATH AND CORONARY ANGIOGRAPHY N/A 10/26/2021   Procedure: LEFT HEART CATH AND CORONARY  ANGIOGRAPHY;  Surgeon: Nelva Bush, MD;  Location: [facility];  Service: Cardiovascular;  Laterality: N/A;  . SHOULDER SURGERY    . TONSILLECTOMY     Social History   Socioeconomic History  . Marital status: Married    Spouse name: Not on file  . Number of children: Not on file  . Years of education: Not on file  . Highest education level: Not on file  Occupational History  . Occupation: retired  Tobacco Use  . Smoking status: Former    Types: Cigars    Quit date: 2000    Years since quitting: 23.9  . Smokeless tobacco: Current    Types: Chew  . Tobacco comments:    wife states patient never smoked cigarettes, just chewing tobacco  Vaping Use  . Vaping Use: Never used  Substance and Sexual Activity  . Alcohol use: Not Currently    Alcohol/week: 0.0 standard drinks of alcohol  . Drug use: No  . Sexual activity: Yes    Birth control/protection: Inserts  Other Topics Concern  . Not on file  Social History Narrative  . Not on file   Social Determinants of Health   Financial Resource Strain: Low Risk  (01/26/2021)   Overall Financial Resource Strain (CARDIA)   . Difficulty of Paying Living Expenses: Not hard at all  Food Insecurity: No Food Insecurity (01/26/2021)   Hunger Vital Sign   . Worried About Charity fundraiser in the Last Year: Never true   . Ran Out of Food in the Last Year: Never true  Transportation Needs: No Transportation Needs (01/26/2021)   PRAPARE - Transportation   . Lack of Transportation (Medical): No   . Lack of Transportation (Non-Medical): No  Physical Activity: Inactive (01/26/2021)   Exercise Vital Sign   . Days of Exercise per Week: 0 days   . Minutes of Exercise per Session: 0 min  Stress: No Stress Concern Present (01/26/2021)   [place]   . Feeling of Stress : Not at all  Social Connections: Somewhat Isolated (08/29/2017)   Social Connection and Isolation  Panel [NHANES]   . Frequency of Communication with Friends and Family: More than three times a week   . Frequency of Social Gatherings with Friends and Family: More than three times a week   . Attends Religious Services: Never   . Active Member of Clubs or Organizations: No   . Attends Archivist Meetings: Never   . Marital Status: Married  Human resources officer Violence: Not At Risk (08/29/2017)   Humiliation, Afraid, Rape, and Kick questionnaire   . Fear of Current or Ex-Partner: No   . Emotionally Abused: No   . Physically Abused: No   . Sexually Abused: No   Family History  Problem Relation Age of Onset  . Cancer Father  lung  . Heart disease Sister   . Heart disease Brother   . Prostate cancer Neg Hx    Current Outpatient Medications on File Prior to Visit  Medication Sig  . ACCU-CHEK SMARTVIEW test strip TEST BLOOD SUGAR TWO TIMES DAILY  . acetaminophen (TYLENOL) 500 MG tablet Take 1,000 mg by mouth every 8 (eight) hours as needed.   Marland Kitchen allopurinol (ZYLOPRIM) 100 MG tablet TAKE 1 TABLET AT BEDTIME  . aspirin EC 81 MG EC tablet Take 1 tablet (81 mg total) by mouth daily. Swallow whole.  . carvedilol (COREG) 12.5 MG tablet TAKE 1 TABLET TWICE DAILY  . clopidogrel (PLAVIX) 75 MG tablet Take 1 tablet (75 mg total) by mouth daily.  . DROPLET PEN NEEDLES 32G X 4 MM MISC USE WITH [name] INSULIN INJECTION DAILY AS INSTRUCTED  . ezetimibe (ZETIA) 10 MG tablet TAKE 1 TABLET EVERY DAY  . fluticasone (FLONASE) 50 MCG/ACT nasal spray USE 2 SPRAYS IN EACH NOSTRIL ONCE DAILY - USE FOR 4 TO 6 WEEKS, THEN STOP AND USE SEASONALLY OR AS NEEDED.  Marland Kitchen Insulin Glargine ([name] KWIKPEN) 100 UNIT/ML DIAL AND INJECT 34 UNITS UNDER THE SKIN ONCE DAILY AT BEDTIME. ( MAX DAILY DOSE IS 34 UNITS)  . isosorbide mononitrate (IMDUR) 30 MG 24 hr tablet Take 0.5 tablets (15 mg total) by mouth daily.  Marland Kitchen lisinopril (ZESTRIL) 40 MG tablet TAKE 1 TABLET EVERY DAY  . nitroGLYCERIN (NITROSTAT) 0.4 MG SL  tablet Place 1 tablet (0.4 mg total) under the tongue every 5 (five) minutes as needed for chest pain.  . pantoprazole (PROTONIX) 20 MG tablet TAKE 1 TABLET TWICE DAILY BEFORE MEALS (STOP OMEPRAZOLE)  . RYBELSUS 14 MG TABS Take 1 tablet (14 mg total) by mouth daily before breakfast.   No current facility-administered medications on file prior to visit.    Review of Systems  Constitutional:  Negative for activity change, appetite change, chills, diaphoresis, fatigue and fever.  HENT:  Negative for congestion and hearing loss.   Eyes:  Negative for visual disturbance.  Respiratory:  Negative for cough, chest tightness, shortness of breath and wheezing.   Cardiovascular:  Negative for chest pain, palpitations and leg swelling.  Gastrointestinal:  Negative for abdominal pain, constipation, diarrhea, nausea and vomiting.  Genitourinary:  Negative for dysuria, frequency and hematuria.  Musculoskeletal:  Negative for arthralgias and neck pain.  Skin:  Negative for rash.  Neurological:  Positive for weakness (legs). Negative for dizziness, light-headedness, numbness and headaches.  Hematological:  Negative for adenopathy.  Psychiatric/Behavioral:  Negative for behavioral problems, dysphoric mood and sleep disturbance.    Per HPI unless specifically indicated above     Objective:    BP 118/74 (BP Location: Left Arm, Cuff Size: Normal)   Pulse 85   Ht _0  (1.803 m)   Wt 234 lb (106.1 kg)   SpO2 98%   BMI 32.64 kg/m   Wt Readings from Last 3 Encounters:  03/02/22 234 lb (106.1 kg)  11/26/21 231 lb (104.8 kg)  11/25/21 231 lb (104.8 kg)    Physical Exam Vitals and nursing note reviewed.  Constitutional:      General: He is not in acute distress.    Appearance: He is well-developed. He is not diaphoretic.     Comments: Well-appearing, comfortable, cooperative  HENT:     Head: Normocephalic and atraumatic.  Eyes:     General:        Right eye: No discharge.  Left eye: No  discharge.     Conjunctiva/sclera: Conjunctivae normal.     Pupils: Pupils are equal, round, and reactive to light.  Neck:     Thyroid: No thyromegaly.  Cardiovascular:     Rate and Rhythm: Normal rate and regular rhythm.     Pulses: Normal pulses.     Heart sounds: Normal heart sounds. No murmur heard. Pulmonary:     Effort: Pulmonary effort is normal. No respiratory distress.     Breath sounds: Normal breath sounds. No wheezing or rales.  Abdominal:     General: Bowel sounds are normal. There is no distension.     Palpations: Abdomen is soft. There is no mass.     Tenderness: There is no abdominal tenderness.  Musculoskeletal:        General: No tenderness. Normal range of motion.     Cervical back: Normal range of motion and neck supple.     Comments: Upper / Lower Extremities: - Normal muscle tone, strength bilateral upper extremities 5/5, lower extremities 5/5  Lymphadenopathy:     Cervical: No cervical adenopathy.  Skin:    General: Skin is warm and dry.     Findings: No erythema or rash.  Neurological:     Mental Status: He is alert and oriented to person, place, and time.     Comments: Distal sensation intact to light touch all extremities  Psychiatric:        Mood and Affect: Mood normal.        Behavior: Behavior normal.        Thought Content: Thought content normal.     Comments: Well groomed, good eye contact, normal speech and thoughts       Results for orders placed or performed in visit on 02/16/22  Uric acid  Result Value Ref Range   Uric Acid, Serum 5.7 4.0 - 8.0 mg/dL  TSH  Result Value Ref Range   TSH 4.08 0.40 - 4.50 mIU/L  PSA  Result Value Ref Range   PSA 7.90 (H) < OR = 4.00 ng/mL  Hemoglobin A1c  Result Value Ref Range   Hgb A1c MFr Bld 7.8 (H) <5.7 % of total Hgb   Mean Plasma Glucose 177 mg/dL   eAG (mmol/L) 9.8 mmol/L  Lipid panel  Result Value Ref Range   Cholesterol 149 <200 mg/dL   HDL 37 (L) > OR = 40 mg/dL   Triglycerides 160  (H) <150 mg/dL   LDL Cholesterol (Calc) 86 mg/dL (calc)   Total CHOL/HDL Ratio 4.0 <5.0 (calc)   Non-HDL Cholesterol (Calc) 112 <130 mg/dL (calc)  CBC with Differential/Platelet  Result Value Ref Range   WBC 6.2 3.8 - 10.8 Thousand/uL   RBC 4.38 4.20 - 5.80 Million/uL   Hemoglobin 13.0 (L) 13.2 - 17.1 g/dL   HCT 39.3 38.5 - 50.0 %   MCV 89.7 80.0 - 100.0 fL   MCH 29.7 27.0 - 33.0 pg   MCHC 33.1 32.0 - 36.0 g/dL   RDW 14.1 11.0 - 15.0 %   Platelets 246 140 - 400 Thousand/uL   MPV 9.7 7.5 - 12.5 fL   Neutro Abs 3,912 1,500 - 7,800 cells/uL   Lymphs Abs 1,581 850 - 3,900 cells/uL   Absolute Monocytes 465 200 - 950 cells/uL   Eosinophils Absolute 192 15 - 500 cells/uL   Basophils Absolute 50 0 - 200 cells/uL   Neutrophils Relative % 63.1 %   Total Lymphocyte 25.5 %   Monocytes Relative  7.5 %   Eosinophils Relative 3.1 %   Basophils Relative 0.8 %  COMPLETE METABOLIC PANEL WITH GFR  Result Value Ref Range   Glucose, Bld 233 (H) 65 - 99 mg/dL   BUN 20 7 - 25 mg/dL   Creat 1.46 (H) 0.70 - 1.28 mg/dL   eGFR 51 (L) > OR = 60 mL/min/1.17m   BUN/Creatinine Ratio 14 6 - 22 (calc)   Sodium 137 135 - 146 mmol/L   Potassium 4.6 3.5 - 5.3 mmol/L   Chloride 103 98 - 110 mmol/L   CO2 28 20 - 32 mmol/L   Calcium 9.0 8.6 - 10.3 mg/dL   Total Protein 7.0 6.1 - 8.1 g/dL   Albumin 4.0 3.6 - 5.1 g/dL   Globulin 3.0 1.9 - 3.7 g/dL (calc)   AG Ratio 1.3 1.0 - 2.5 (calc)   Total Bilirubin 0.3 0.2 - 1.2 mg/dL   Alkaline phosphatase (APISO) 71 35 - 144 U/L   AST 27 10 - 35 U/L   ALT 26 9 - 46 U/L      Assessment & Plan:   Problem List Items Addressed This Visit     CAD S/P percutaneous coronary angioplasty   CKD stage 3 due to type 2 diabetes mellitus (HCC)   Diabetes mellitus (HCC)   Drug-induced myopathy   Gout   Obesity (BMI 30.0-34.9)   Other Visit Diagnoses     Annual physical exam    -  Primary   Benign hypertension with CKD (chronic kidney disease) stage III (HCC)        Hyperlipidemia associated with type 2 diabetes mellitus ([place]           Updated Health Maintenance information Reviewed recent lab results with patient Encouraged improvement to lifestyle with diet and exercise Goal of weight loss  HOLD Zetia for 2 weeks to see if weakness and muscle symptoms improve.  If the symptoms of weakness persist, then you can resume taking the Zetia and the answer is likely with the spine / nerves of the back.  If you want to try the new cholesterol medication injection - with 0 side effects  Repatha or Praluent Every 2 to 4 weeks depending on the dosage.  Check into cost and coverage if you can.   No orders of the defined types were placed in this encounter.    Follow up plan: Return in about 6 months (around 09/01/2022) for 6 month follow-up DM A1c, Urology updates.  ANobie Putnam DLake [facility]Medical Group 03/02/2022, 11:21 AM

## 2022-03-03 NOTE — Assessment & Plan Note (Signed)
Secondary to side effect on statin (failed rosuvastatin, crestor, lipitor in past)  Off Statin Now on Zetia but also having myopathy  Offer [id number] see below

## 2022-03-04 ENCOUNTER — Ambulatory Visit (INDEPENDENT_AMBULATORY_CARE_PROVIDER_SITE_OTHER): Payer: Medicare HMO

## 2022-03-04 VITALS — BP 134/70 | Ht 71.0 in | Wt 232.4 lb

## 2022-03-04 DIAGNOSIS — Z0000 Encounter for general adult medical examination without abnormal findings: Secondary | ICD-10-CM

## 2022-03-04 NOTE — Patient Instructions (Signed)
[name] , Thank you for taking time to come for your Medicare Wellness Visit. I appreciate your ongoing commitment to your health goals. Please review the following plan we discussed and let me know if I can assist you in the future.   Screening recommendations/referrals: Colonoscopy: Cologuard 09/06/21 Recommended yearly ophthalmology/optometry visit for glaucoma screening and checkup Recommended yearly dental visit for hygiene and checkup  Vaccinations: Influenza vaccine: n/d Pneumococcal vaccine: n/d Tdap vaccine: 07/25/16 Shingles vaccine: n/d   Covid-19: 05/15/19, 06/05/19  Advanced directives: no  Conditions/risks identified: none  Next appointment: Follow up in one year for your annual wellness visit. 03/10/23 @ 9 am in person  Preventive Care 70 Years and Older, Male Preventive care refers to lifestyle choices and visits with your health care provider that can promote health and wellness. What does preventive care include? A yearly physical exam. This is also called an annual well check. Dental exams once or twice a year. Routine eye exams. Ask your health care provider how often you should have your eyes checked. Personal lifestyle choices, including: Daily care of your teeth and gums. Regular physical activity. Eating a healthy diet. Avoiding tobacco and drug use. Limiting alcohol use. Practicing safe sex. Taking low doses of aspirin every day. Taking vitamin and mineral supplements as recommended by your health care provider. What happens during an annual well check? The services and screenings done by your health care provider during your annual well check will depend on your age, overall health, lifestyle risk factors, and family history of disease. Counseling  Your health care provider may ask you questions about your: Alcohol use. Tobacco use. Drug use. Emotional well-being. Home and relationship well-being. Sexual activity. Eating habits. History of  falls. Memory and ability to understand (cognition). Work and work Astronomer. Screening  You may have the following tests or measurements: Height, weight, and BMI. Blood pressure. Lipid and cholesterol levels. These may be checked every 5 years, or more frequently if you are over 54 years old. Skin check. Lung cancer screening. You may have this screening every year starting at age 42 if you have a 30-pack-year history of smoking and currently smoke or have quit within the past 15 years. Fecal occult blood test (FOBT) of the stool. You may have this test every year starting at age 57. Flexible sigmoidoscopy or colonoscopy. You may have a sigmoidoscopy every 5 years or a colonoscopy every 10 years starting at age 42. Prostate cancer screening. Recommendations will vary depending on your family history and other risks. Hepatitis C blood test. Hepatitis B blood test. Sexually transmitted disease (STD) testing. Diabetes screening. This is done by checking your blood sugar (glucose) after you have not eaten for a while (fasting). You may have this done every 1-3 years. Abdominal aortic aneurysm (AAA) screening. You may need this if you are a current or former smoker. Osteoporosis. You may be screened starting at age 60 if you are at high risk. Talk with your health care provider about your test results, treatment options, and if necessary, the need for more tests. Vaccines  Your health care provider may recommend certain vaccines, such as: Influenza vaccine. This is recommended every year. Tetanus, diphtheria, and acellular pertussis (Tdap, Td) vaccine. You may need a Td booster every 10 years. Zoster vaccine. You may need this after age 2. Pneumococcal 13-valent conjugate (PCV13) vaccine. One dose is recommended after age 45. Pneumococcal polysaccharide (PPSV23) vaccine. One dose is recommended after age 19. Talk to your health care  provider about which screenings and vaccines you need and  how often you need them. This information is not intended to replace advice given to you by your health care provider. Make sure you discuss any questions you have with your health care provider. Document Released: 04/10/2015 Document Revised: 12/02/2015 Document Reviewed: 01/13/2015 Elsevier Interactive Patient Education  2017 [place] Prevention in the Home Falls can cause injuries. They can happen to people of all ages. There are many things you can do to make your home safe and to help prevent falls. What can I do on the outside of my home? Regularly fix the edges of walkways and driveways and fix any cracks. Remove anything that might make you trip as you walk through a door, such as a raised step or threshold. Trim any bushes or trees on the path to your home. Use bright outdoor lighting. Clear any walking paths of anything that might make someone trip, such as rocks or tools. Regularly check to see if handrails are loose or broken. Make sure that both sides of any steps have handrails. Any raised decks and porches should have guardrails on the edges. Have any leaves, snow, or ice cleared regularly. Use sand or salt on walking paths during winter. Clean up any spills in your garage right away. This includes oil or grease spills. What can I do in the bathroom? Use night lights. Install grab bars by the toilet and in the tub and shower. Do not use towel bars as grab bars. Use non-skid mats or decals in the tub or shower. If you need to sit down in the shower, use a plastic, non-slip stool. Keep the floor dry. Clean up any water that spills on the floor as soon as it happens. Remove soap buildup in the tub or shower regularly. Attach bath mats securely with double-sided non-slip rug tape. Do not have throw rugs and other things on the floor that can make you trip. What can I do in the bedroom? Use night lights. Make sure that you have a light by your bed that is easy to  reach. Do not use any sheets or blankets that are too big for your bed. They should not hang down onto the floor. Have a firm chair that has side arms. You can use this for support while you get dressed. Do not have throw rugs and other things on the floor that can make you trip. What can I do in the kitchen? Clean up any spills right away. Avoid walking on wet floors. Keep items that you use a lot in easy-to-reach places. If you need to reach something above you, use a strong step stool that has a grab bar. Keep electrical cords out of the way. Do not use floor polish or wax that makes floors slippery. If you must use wax, use non-skid floor wax. Do not have throw rugs and other things on the floor that can make you trip. What can I do with my stairs? Do not leave any items on the stairs. Make sure that there are handrails on both sides of the stairs and use them. Fix handrails that are broken or loose. Make sure that handrails are as long as the stairways. Check any carpeting to make sure that it is firmly attached to the stairs. Fix any carpet that is loose or worn. Avoid having throw rugs at the top or bottom of the stairs. If you do have throw rugs, attach them to the  floor with carpet tape. Make sure that you have a light switch at the top of the stairs and the bottom of the stairs. If you do not have them, ask someone to add them for you. What else can I do to help prevent falls? Wear shoes that: Do not have high heels. Have rubber bottoms. Are comfortable and fit you well. Are closed at the toe. Do not wear sandals. If you use a stepladder: Make sure that it is fully opened. Do not climb a closed stepladder. Make sure that both sides of the stepladder are locked into place. Ask someone to hold it for you, if possible. Clearly mark and make sure that you can see: Any grab bars or handrails. First and last steps. Where the edge of each step is. Use tools that help you move  around (mobility aids) if they are needed. These include: Canes. Walkers. Scooters. Crutches. Turn on the lights when you go into a dark area. Replace any light bulbs as soon as they burn out. Set up your furniture so you have a clear path. Avoid moving your furniture around. If any of your floors are uneven, fix them. If there are any pets around you, be aware of where they are. Review your medicines with your doctor. Some medicines can make you feel dizzy. This can increase your chance of falling. Ask your doctor what other things that you can do to help prevent falls. This information is not intended to replace advice given to you by your health care provider. Make sure you discuss any questions you have with your health care provider. Document Released: 01/08/2009 Document Revised: 08/20/2015 Document Reviewed: 04/18/2014 Elsevier Interactive Patient Education  2017 [employer].

## 2022-03-04 NOTE — Progress Notes (Signed)
Subjective:   Charlotte SanesKenneth Eugene Landen is a 71 y.o. male who presents for Medicare Annual/Subsequent preventive examination.  Review of Systems     Cardiac Risk Factors include: advanced age (>7655men, 17>65 women);diabetes mellitus;dyslipidemia;male gender;smoking/ tobacco exposure;hypertension     Objective:    Today's Vitals   03/04/22 1016  BP: 134/70  Weight: 232 lb 6.4 oz (105.4 kg)  Height: 5\' 11"  (1.803 m)   Body mass index is 32.41 kg/m.     03/04/2022   10:25 AM 10/26/2021    9:53 AM 09/06/2021   12:20 AM 09/05/2021    8:41 PM 06/09/2021    6:42 AM 05/02/2021    1:04 PM 01/26/2021    8:30 AM  Advanced Directives  Does Patient Have a Medical Advance Directive? No No No No No No No  Would patient like information on creating a medical advance directive? No - Patient declined No - Patient declined No - Patient declined No - Patient declined Yes (MAU/Ambulatory/Procedural Areas - Information given)      Current Medications (verified) Outpatient Encounter Medications as of 03/04/2022  Medication Sig   ACCU-CHEK SMARTVIEW test strip TEST BLOOD SUGAR TWO TIMES DAILY   acetaminophen (TYLENOL) 500 MG tablet Take 1,000 mg by mouth every 8 (eight) hours as needed.    allopurinol (ZYLOPRIM) 100 MG tablet TAKE 1 TABLET AT BEDTIME   aspirin EC 81 MG EC tablet Take 1 tablet (81 mg total) by mouth daily. Swallow whole.   carvedilol (COREG) 12.5 MG tablet TAKE 1 TABLET TWICE DAILY   clopidogrel (PLAVIX) 75 MG tablet Take 1 tablet (75 mg total) by mouth daily.   DROPLET PEN NEEDLES 32G X 4 MM MISC USE WITH [name] INSULIN INJECTION DAILY AS INSTRUCTED   fluticasone (FLONASE) 50 MCG/ACT nasal spray USE 2 SPRAYS IN EACH NOSTRIL ONCE DAILY - USE FOR 4 TO 6 WEEKS, THEN STOP AND USE SEASONALLY OR AS NEEDED.   Insulin Glargine ([name] KWIKPEN) 100 UNIT/ML DIAL AND INJECT 34 UNITS UNDER THE SKIN ONCE DAILY AT BEDTIME. ( MAX DAILY DOSE IS 34 UNITS)   isosorbide mononitrate (IMDUR) 30 MG 24 hr tablet  Take 0.5 tablets (15 mg total) by mouth daily.   lisinopril (ZESTRIL) 40 MG tablet TAKE 1 TABLET EVERY DAY   nitroGLYCERIN (NITROSTAT) 0.4 MG SL tablet Place 1 tablet (0.4 mg total) under the tongue every 5 (five) minutes as needed for chest pain.   pantoprazole (PROTONIX) 20 MG tablet TAKE 1 TABLET TWICE DAILY BEFORE MEALS (STOP OMEPRAZOLE)   RYBELSUS 14 MG TABS Take 1 tablet (14 mg total) by mouth daily before breakfast.   ezetimibe (ZETIA) 10 MG tablet TAKE 1 TABLET EVERY DAY (Patient not taking: Reported on 03/04/2022)   No facility-administered encounter medications on file as of 03/04/2022.    Allergies (verified) Crestor [rosuvastatin calcium], Ciprofloxacin, Invokana [canagliflozin], Prednisone, and Sulfamethizole   History: Past Medical History:  Diagnosis Date   Allergy    CAD (coronary artery disease)    a. 01/2019 MV: EF 55-65%, no ischemia; b. 12/2019 NSTEMI/PCI: LM nl, LAD 40p, 5471m, D1 40, LCX nl, OM3 40, RCA 90/85p (2.75x34 Resolute Onyx DES), 1771m/d.   Chronic headaches    CKD (chronic kidney disease), stage III (HCC)    Edentulous    no lower teeth   GERD (gastroesophageal reflux disease)    Gout    Heart burn    History of echocardiogram    a. 12/2019 Echo: EF 60-65%, no rwma, triv MR.  Hyperlipidemia    Hypertension    Motion sickness    boats   NSTEMI (non-ST elevated myocardial infarction) (HCC) 12/27/2019   Pulmonary embolism (HCC) 05/2017   Type 2 diabetes mellitus [facility])    Past Surgical History:  Procedure Laterality Date   BACK SURGERY  02/26/2019   CATARACT EXTRACTION W/PHACO Left 04/14/2021   Procedure: CATARACT EXTRACTION PHACO AND INTRAOCULAR LENS PLACEMENT (IOC) LEFT DIABETIC maylugin;  Surgeon: Lockie Mola, MD;  Location: [facility];  Service: Ophthalmology;  Laterality: Left;  11.22 1:25.1   CATARACT EXTRACTION W/PHACO Right 06/09/2021   Procedure: CATARACT EXTRACTION PHACO AND INTRAOCULAR LENS PLACEMENT (IOC) RIGHT DIABETIC  MALYUGIN;  Surgeon: Lockie Mola, MD;  Location: [facility];  Service: Ophthalmology;  Laterality: Right;  Diabetic 4.75 01:05.7   CORONARY STENT INTERVENTION N/A 10/26/2021   Procedure: CORONARY STENT INTERVENTION;  Surgeon: Yvonne Kendall, MD;  Location: [facility];  Service: Cardiovascular;  Laterality: N/A;   INTRAVASCULAR ULTRASOUND/IVUS N/A 10/26/2021   Procedure: Intravascular Ultrasound/IVUS;  Surgeon: Yvonne Kendall, MD;  Location: [facility];  Service: Cardiovascular;  Laterality: N/A;   KNEE SURGERY     LEFT HEART CATH AND CORONARY ANGIOGRAPHY N/A 12/30/2019   Procedure: LEFT HEART CATH AND CORONARY ANGIOGRAPHY;  Surgeon: Iran Ouch, MD;  Location: [facility];  Service: Cardiovascular;  Laterality: N/A;   LEFT HEART CATH AND CORONARY ANGIOGRAPHY N/A 10/26/2021   Procedure: LEFT HEART CATH AND CORONARY ANGIOGRAPHY;  Surgeon: Yvonne Kendall, MD;  Location: [facility];  Service: Cardiovascular;  Laterality: N/A;   SHOULDER SURGERY     TONSILLECTOMY     Family History  Problem Relation Age of Onset   Cancer Father        lung   Heart disease Sister    Heart disease Brother    Prostate cancer Neg Hx    Social History   Socioeconomic History   Marital status: Married    Spouse name: Not on file   Number of children: Not on file   Years of education: Not on file   Highest education level: Not on file  Occupational History   Occupation: retired  Tobacco Use   Smoking status: Former    Types: Cigars    Quit date: 2000    Years since quitting: 23.9   Smokeless tobacco: Current    Types: Chew   Tobacco comments:    wife states patient never smoked cigarettes, just chewing tobacco  Vaping Use   Vaping Use: Never used  Substance and Sexual Activity   Alcohol use: Not Currently    Alcohol/week: 0.0 standard drinks of alcohol   Drug use: No   Sexual activity: Yes    Birth control/protection: Inserts  Other  Topics Concern   Not on file  Social History Narrative   Not on file   Social Determinants of Health   Financial Resource Strain: Low Risk  (03/04/2022)   Overall Financial Resource Strain (CARDIA)    Difficulty of Paying Living Expenses: Not hard at all  Food Insecurity: No Food Insecurity (03/04/2022)   Hunger Vital Sign    Worried About Running Out of Food in the Last Year: Never true    Ran Out of Food in the Last Year: Never true  Transportation Needs: No Transportation Needs (03/04/2022)   PRAPARE - Administrator, Civil Service (Medical): No    Lack of Transportation (Non-Medical): No  Physical Activity: Insufficiently Active (03/04/2022)  Exercise Vital Sign    Days of Exercise per Week: 3 days    Minutes of Exercise per Session: 30 min  Stress: No Stress Concern Present (03/04/2022)   [employer] of Occupational Health - Occupational Stress Questionnaire    Feeling of Stress : Not at all  Social Connections: Moderately Isolated (03/04/2022)   Social Connection and Isolation Panel [NHANES]    Frequency of Communication with Friends and Family: More than three times a week    Frequency of Social Gatherings with Friends and Family: Once a week    Attends Religious Services: Never    Database administrator or Organizations: No    Attends Engineer, structural: Never    Marital Status: Married    Tobacco Counseling Ready to quit: Not Answered Counseling given: Not Answered Tobacco comments: wife states patient never smoked cigarettes, just chewing tobacco   Clinical Intake:  Pre-visit preparation completed: Yes  Pain : 0-10 Pain Location: Genitalia Pain Radiating Towards: urinary pain     Nutritional Risks: None Diabetes: Yes CBG done?: No Did pt. bring in CBG monitor from home?: No  How often do you need to have someone help you when you read instructions, pamphlets, or other written materials from your doctor or pharmacy?: 1 -  Never  Diabetic?yes Nutrition Risk Assessment:  Has the patient had any N/V/D within the last 2 months?  No  Does the patient have any non-healing wounds?  No  Has the patient had any unintentional weight loss or weight gain?  No   Diabetes:  Is the patient diabetic?  Yes  If diabetic, was a CBG obtained today?  No  Did the patient bring in their glucometer from home?  No  How often do you monitor your CBG's? Once/day.   Financial Strains and Diabetes Management:  Are you having any financial strains with the device, your supplies or your medication? Yes .  Does the patient want to be seen by Chronic Care Management for management of their diabetes?  No  Would the patient like to be referred to a Nutritionist or for Diabetic Management?  No   Diabetic Exams:  Diabetic Eye Exam: Completed 12/27/21. Pt has been advised about the importance in completing this exam.   Diabetic Foot Exam: Completed 11/10/20. Pt has been advised about the importance in completing this exam.   Interpreter Needed?: No  Information entered by :: Kennedy Bucker, LPN   Activities of Daily Living    03/04/2022   10:26 AM 10/26/2021    5:11 PM  In your present state of health, do you have any difficulty performing the following activities:  Hearing? 1   Vision? 0   Difficulty concentrating or making decisions? 0   Walking or climbing stairs? 1   Dressing or bathing? 0   Doing errands, shopping? 0 0  Preparing Food and eating ? N   Using the Toilet? N   In the past six months, have you accidently leaked urine? N   Do you have problems with loss of bowel control? N   Managing your Medications? N   Managing your Finances? N   Housekeeping or managing your Housekeeping? N     Patient Care Team: [name], DO as PCP - General (Family Medicine) Debbe Odea, MD as PCP - Cardiology (Cardiology) Ronney Asters, Jackelyn Poling, RPH-CPP as Pharmacist  Indicate any recent Medical Services you  may have received from other than [facility] providers in the past year (date  may be approximate).     Assessment:   This is a routine wellness examination for Edgar.  Hearing/Vision screen Hearing Screening - Comments:: No aids Vision Screening - Comments:: Readers- [facility]  Dietary issues and exercise activities discussed: Current Exercise Habits: Home exercise routine, Type of exercise: walking, Time (Minutes): 30, Frequency (Times/Week): 3, Weekly Exercise (Minutes/Week): 90, Intensity: Mild   Goals Addressed             This Visit's Progress    DIET - EAT MORE FRUITS AND VEGETABLES         Depression Screen    03/04/2022   10:23 AM 03/02/2022   10:34 AM 09/16/2021   10:27 AM 01/26/2021    8:31 AM 11/10/2020    2:14 PM 01/23/2020    4:14 PM 01/21/2020    8:34 AM  PHQ 2/9 Scores  PHQ - 2 Score 0 0 0 0 0 0 0  PHQ- 9 Score 0 0 0  0 1     Fall Risk    03/04/2022   10:26 AM 03/02/2022   10:34 AM 09/16/2021   10:27 AM 01/26/2021    8:31 AM 11/10/2020    2:14 PM  Fall Risk   Falls in the past year? 0 0 0 0 0  Number falls in past yr: 0 0 0  0  Injury with Fall? 0 0 0  0  Risk for fall due to : No Fall Risks No Fall Risks No Fall Risks Medication side effect   Follow up Falls prevention discussed;Falls evaluation completed Falls evaluation completed Falls evaluation completed Falls evaluation completed;Education provided;Falls prevention discussed Falls evaluation completed    FALL RISK PREVENTION PERTAINING TO THE HOME:  Any stairs in or around the home? Yes  If so, are there any without handrails? No  Home free of loose throw rugs in walkways, pet beds, electrical cords, etc? Yes  Adequate lighting in your home to reduce risk of falls? Yes   ASSISTIVE DEVICES UTILIZED TO PREVENT FALLS:  Life alert? No  Use of a cane, walker or w/c? No  Grab bars in the bathroom? No  Shower chair or bench in shower? Yes  Elevated toilet seat or a handicapped toilet? No   TIMED  UP AND GO:  Was the test performed? Yes .  Length of time to ambulate 10 feet: 4 sec.   Gait steady and fast without use of assistive device  Cognitive Function:        03/04/2022   10:34 AM 01/26/2021    8:35 AM 01/21/2020    8:38 AM 08/29/2017    9:07 AM  6CIT Screen  What Year? 0 points 0 points 0 points 0 points  What month? 0 points 0 points 0 points 0 points  What time? 0 points 0 points 0 points 0 points  Count back from 20 0 points 0 points 0 points 0 points  Months in reverse 0 points 2 points 4 points 0 points  Repeat phrase 4 points 8 points 4 points 2 points  Total Score 4 points 10 points 8 points 2 points    Immunizations Immunization History  Administered Date(s) Administered   PFIZER(Purple Top)SARS-COV-2 Vaccination 05/15/2019, 06/05/2019   Tdap 07/25/2016    TDAP status: Up to date  Flu Vaccine status: Declined, Education has been provided regarding the importance of this vaccine but patient still declined. Advised may receive this vaccine at local pharmacy or Health Dept. Aware to provide a copy of  the vaccination record if obtained from local pharmacy or Health Dept. Verbalized acceptance and understanding.  Pneumococcal vaccine status: Declined,  Education has been provided regarding the importance of this vaccine but patient still declined. Advised may receive this vaccine at local pharmacy or Health Dept. Aware to provide a copy of the vaccination record if obtained from local pharmacy or Health Dept. Verbalized acceptance and understanding.   Covid-19 vaccine status: Completed vaccines  Qualifies for Shingles Vaccine? Yes   Zostavax completed No   Shingrix Completed?: No.    Education has been provided regarding the importance of this vaccine. Patient has been advised to call insurance company to determine out of pocket expense if they have not yet received this vaccine. Advised may also receive vaccine at local pharmacy or Health Dept. Verbalized  acceptance and understanding.  Screening Tests Health Maintenance  Topic Date Due   Zoster Vaccines- Shingrix (1 of 2) 06/01/2022 (Originally 08/27/1969)   INFLUENZA VACCINE  06/26/2022 (Originally 10/26/2021)   COVID-19 Vaccine (3 - Pfizer risk series) 11/27/2022 (Originally 07/03/2019)   Pneumonia Vaccine 9+ Years old (1 - PCV) 03/03/2023 (Originally 08/26/2016)   HEMOGLOBIN A1C  08/22/2022   Diabetic kidney evaluation - Urine ACR  12/07/2022   OPHTHALMOLOGY EXAM  12/28/2022   Diabetic kidney evaluation - GFR measurement  02/22/2023   FOOT EXAM  03/03/2023   Medicare Annual Wellness (AWV)  03/05/2023   Fecal DNA (Cologuard)  08/28/2024   DTaP/Tdap/Td (2 - Td or Tdap) 07/26/2026   Hepatitis C Screening  Completed   HPV VACCINES  Aged Out    Health Maintenance  There are no preventive care reminders to display for this patient.   Colorectal cancer screening: Type of screening: Cologuard. Completed 09/06/21. Repeat every 3 years  Lung Cancer Screening: (Low Dose CT Chest recommended if Age 55-80 years, 30 pack-year currently smoking OR have quit w/in 15years.) does not qualify.   Additional Screening:  Hepatitis C Screening: does qualify; Completed 08/29/17  Vision Screening: Recommended annual ophthalmology exams for early detection of glaucoma and other disorders of the eye. Is the patient up to date with their annual eye exam?  Yes  Who is the provider or what is the name of the office in which the patient attends annual eye exams?  Eye If pt is not established with a provider, would they like to be referred to a provider to establish care? No .   Dental Screening: Recommended annual dental exams for proper oral hygiene  Community Resource Referral / Chronic Care Management: CRR required this visit?  No   CCM required this visit?  No      Plan:     I have personally reviewed and noted the following in the patient's chart:   Medical and social history Use of  alcohol, tobacco or illicit drugs  Current medications and supplements including opioid prescriptions. Patient is not currently taking opioid prescriptions. Functional ability and status Nutritional status Physical activity Advanced directives List of other physicians Hospitalizations, surgeries, and ER visits in previous 12 months Vitals Screenings to include cognitive, depression, and falls Referrals and appointments  In addition, I have reviewed and discussed with patient certain preventive protocols, quality metrics, and best practice recommendations. A written personalized care plan for preventive services as well as general preventive health recommendations were provided to patient.     Hal Hope, LPN   [id number]   Nurse Notes: none

## 2022-03-06 ENCOUNTER — Telehealth: Payer: Self-pay | Admitting: Pharmacy Technician

## 2022-03-06 DIAGNOSIS — Z596 Low income: Secondary | ICD-10-CM

## 2022-03-06 NOTE — Progress Notes (Signed)
[facility])                                            [facility] Quality Pharmacy Team    03/06/2022  [name] Jul 11, 1950 [id number]  Received both patient and provider portion(s) of patient assistance application(s) for Rybelsus and [name]. Faxed completed application and required documents into [employer].  Noreene Larsson P. Eryca Bolte, CPhT [facility] [employer]  [phone]

## 2022-03-14 ENCOUNTER — Encounter: Payer: Self-pay | Admitting: Family Medicine

## 2022-03-14 DIAGNOSIS — N390 Urinary tract infection, site not specified: Secondary | ICD-10-CM

## 2022-03-14 MED ORDER — CEPHALEXIN 250 MG PO CAPS: 250.0000 mg | ORAL_CAPSULE | Freq: Every day | ORAL | 1 refills | Status: DC

## 2022-03-15 ENCOUNTER — Encounter: Payer: Self-pay | Admitting: Family Medicine

## 2022-03-15 MED ORDER — CEPHALEXIN 250 MG PO CAPS: 250.0000 mg | ORAL_CAPSULE | Freq: Every day | ORAL | 0 refills | Status: DC

## 2022-03-15 NOTE — Addendum Note (Signed)
Addended by: [name] on: 03/15/2022 01:26 PM   Modules accepted: Orders

## 2022-03-31 ENCOUNTER — Other Ambulatory Visit: Payer: Self-pay | Admitting: Family Medicine

## 2022-03-31 DIAGNOSIS — I252 Old myocardial infarction: Secondary | ICD-10-CM

## 2022-03-31 DIAGNOSIS — E785 Hyperlipidemia, unspecified: Secondary | ICD-10-CM

## 2022-03-31 NOTE — Telephone Encounter (Signed)
Requested Prescriptions  Pending Prescriptions Disp Refills   ezetimibe (ZETIA) 10 MG tablet [Pharmacy Med Name: EZETIMIBE 10 MG Tablet] 90 tablet 3    Sig: TAKE 1 TABLET EVERY DAY     Cardiovascular:  Antilipid - Sterol Transport Inhibitors Failed - 03/31/2022  2:27 AM      Failed - Lipid Panel in normal range within the last 12 months    Cholesterol, Total  Date Value Ref Range Status  11/12/2019 212 (H) 100 - 199 mg/dL Final   Cholesterol  Date Value Ref Range Status  02/21/2022 149 <200 mg/dL Final   LDL Cholesterol (Calc)  Date Value Ref Range Status  02/21/2022 86 mg/dL (calc) Final    Comment:    Reference range: <100 . Desirable range <100 mg/dL for primary prevention;   <70 mg/dL for patients with CHD or diabetic patients  with > or = 2 CHD risk factors. Marland Kitchen LDL-C is now calculated using the Martin-Hopkins  calculation, which is a validated novel method providing  better accuracy than the Friedewald equation in the  estimation of LDL-C.  Cresenciano Genre et al. Annamaria Helling. 4174;081(44): 2061-2068  (http://education.[facility].com/faq/FAQ164)    HDL  Date Value Ref Range Status  02/21/2022 37 (L) > OR = 40 mg/dL Final  11/12/2019 32 (L) >39 mg/dL Final   Triglycerides  Date Value Ref Range Status  02/21/2022 160 (H) <150 mg/dL Final         Passed - AST in normal range and within 360 days    AST  Date Value Ref Range Status  02/21/2022 27 10 - 35 U/L Final         Passed - ALT in normal range and within 360 days    ALT  Date Value Ref Range Status  02/21/2022 26 9 - 46 U/L Final         Passed - Patient is not pregnant      Passed - Valid encounter within last 12 months    Recent Outpatient Visits           3 weeks ago Encounter for [employer] annual wellness exam   [facility] Dionisio David, LPN   4 weeks ago Annual physical exam   [facility] [place], [name], DO   1 month ago Type 2 diabetes mellitus with  other specified complication, with long-term current use of insulin ([place])   [facility] Delles, Grayland Ormond A, RPH-CPP   3 months ago Type 2 diabetes mellitus with other specified complication, with long-term current use of insulin ([place])   [facility] Delles, Virl Diamond, RPH-CPP   4 months ago [place], PA-C       Future Appointments             In 5 months [name], [name], DO [facility]

## 2022-04-01 ENCOUNTER — Other Ambulatory Visit: Payer: Self-pay | Admitting: Family Medicine

## 2022-04-01 ENCOUNTER — Encounter: Payer: Self-pay | Admitting: Pharmacist

## 2022-04-01 ENCOUNTER — Ambulatory Visit: Payer: Medicare HMO | Admitting: Pharmacist

## 2022-04-01 DIAGNOSIS — I129 Hypertensive chronic kidney disease with stage 1 through stage 4 chronic kidney disease, or unspecified chronic kidney disease: Secondary | ICD-10-CM

## 2022-04-01 DIAGNOSIS — E1169 Type 2 diabetes mellitus with other specified complication: Secondary | ICD-10-CM

## 2022-04-01 DIAGNOSIS — I2510 Atherosclerotic heart disease of native coronary artery without angina pectoris: Secondary | ICD-10-CM

## 2022-04-01 NOTE — Progress Notes (Signed)
04/01/2022 Name: [name] MRN: [id number] DOB: 05/16/1950  Chief Complaint  Patient presents with   Medication Management   Medication Assistance    [name] is a 72 y.o. year old male who presented for a telephone visit.   They were referred to the pharmacist by their PCP for assistance in managing diabetes, hypertension, hyperlipidemia, and medication access.    Subjective:  Perform chart review. - Patient seen for Office Visit with [facility] on 02/07/2022 for cystoscopy to assess bladder. Provider advised patient: Return in 3 months for renal ultrasound and office visit Trial d-mannose cranberry and probiotic for UTI prevention - Office Visit with PCP on 03/02/2022 for annual exam. Provider advised patient:  "HOLD Zetia for 2 weeks to see if weakness and muscle symptoms improve. If the symptoms of weakness persist, then you can resume taking the Zetia and the answer is likely with the spine / nerves of the back. Regardless, I would recommend PCSK9 inhibitor therapy Repatha vs Praluent for management of his lipids. Especially if zetia still causing myopathy"  Care Team: Primary Care Provider: [name], DO ; Next Scheduled Visit: 09/01/2022 Cardiologist: Daniel Odea, MD Nephrologist: Daniel Pigeon, MD  ; Next Scheduled Visit: 06/13/2022 Urology: [facility]  Medication Access/Adherence  Current Pharmacy:  [facility] [address] [place] [place] - [address] [place] [address] Phone: [phone] Fax: [phone]  [name] - [place], [place] - [address] [place] [address] Phone: [phone] Fax: [phone]  [facility] - [place], [place] - [address] [place] [address] Phone: [phone] Fax: [phone]   Patient reports affordability concerns with their  medications: Yes  Patient reports access/transportation concerns to their pharmacy: No  Patient reports adherence concerns with their medications:  No     Diabetes:   Current medications:  Rybelsus 14 mg daily Taking ?30 minutes before the first food, beverage, or other oral medications of the day with ?4 oz of plain water  Insulin glargine ([name]/Lantus) 37 units QHS   Medications tried in the past:  Trulicity (GI intolerance) Metformin (unable to tolerate - GI side effects) Bydureon (difficulty with device/administration) Invokana (felt dehydrated)   Current glucose readings:   Morning Fasting  30 - December -  31 - December 177  1 - January 191  2 - January 176  3 - January -  4 - January 142  5 - January 168  Attributes recent elevated readings to increased sweets and portion since the holidays, but states now working on reducing portion sizes, including carbohydrate portion sizes   Patient denies hypoglycemic s/sx including dizziness, shakiness, sweating             Again encourage patient to obtain and carry glucose tablets   Current physical activity: stays active throughout the day working   Discuss benefits of continuous blood glucose monitor, but patient declined interest in CGM at this time   Current medication access support:  -Collaborating with [name] for assistance with applying for re-enrollment for for [employer] from [employer] and Rybelsus from [employer] patient assistance programs   Hypertension:   Current medications:  Carvedilol 12.5 mg twice daily Lisinopril 40 mg daily  Imdur 30 mg -1/2 tablet (15 mg) daily   Patient has an automated, upper arm home BP cuff Current blood pressure readings readings: today: 155/83, HR 67 However,  reports reading taken immediately after getting up, while chewing tobacco and right after drinking a diet cola. Patient had not yet taken his blood pressure medication   Denies symptoms of hypotension   Current  physical activity: stays active throughout the day working   Hyperlipidemia/hx of CAD/NSTEMI:   Current lipid lowering medications: none   Reports noticed an improvement in symptoms (muscle weakness) since stopped ezetimibe, so he did not restart taking - Reports interested in starting Repatha as recommended by PCP, if able to afford the medication  Previous therapies tried: atorvastatin 40 mg daily & 10 mg daily (unable to tolerate due to myalgia), ezetimibe    Have discussed importance of limiting saturated fats and trans fats in diet   Antiplatelet regimen: Aspirin 81 mg daily Clopidogrel 75 mg daily   Current physical activity: stays active throughout the day working     Health Maintenance  There are no preventive care reminders to display for this patient.   Objective: Lab Results  Component Value Date   HGBA1C 7.8 (H) 02/21/2022    Lab Results  Component Value Date   CREATININE 1.46 (H) 02/21/2022   BUN 20 02/21/2022   NA 137 02/21/2022   K 4.6 02/21/2022   CL 103 02/21/2022   CO2 28 02/21/2022    Lab Results  Component Value Date   CHOL 149 02/21/2022   HDL 37 (L) 02/21/2022   LDLCALC 86 02/21/2022   TRIG 160 (H) 02/21/2022   CHOLHDL 4.0 02/21/2022   BP Readings from Last 3 Encounters:  03/04/22 134/70  03/02/22 118/74  11/26/21 123/60   Pulse Readings from Last 3 Encounters:  03/02/22 85  11/26/21 82  11/25/21 79    Medications Reviewed Today     Reviewed by [name], RPH-CPP (Pharmacist) on 04/01/22 at [place] List Status: <None>   Medication Order Taking? Sig Documenting Provider Last Dose Status Informant  ACCU-CHEK SMARTVIEW test strip [id number]  TEST BLOOD SUGAR TWO TIMES DAILY [name], DO  Active   acetaminophen (TYLENOL) 500 MG tablet [id number]  Take 1,000 mg by mouth every 8 (eight) hours as needed.  [provider]  Active Spouse/Significant Other, Pharmacy Records  allopurinol (ZYLOPRIM) 100 MG  tablet [id number]  TAKE 1 TABLET AT BEDTIME [name], DO  Active   aspirin EC 81 MG EC tablet [id number]  Take 1 tablet (81 mg total) by mouth daily. Swallow whole. [name], MD  Active Spouse/Significant Other, Pharmacy Records  carvedilol (COREG) 12.5 MG tablet [id number]  TAKE 1 TABLET TWICE DAILY [name], DO  Active   cephALEXin (KEFLEX) 250 MG capsule [id number]  Take 1 capsule (250 mg total) by mouth daily. For UTI prophylaxis [name], DO  Active   clopidogrel (PLAVIX) 75 MG tablet [id number]  Take 1 tablet (75 mg total) by mouth daily. [name], MD  Active   DROPLET PEN NEEDLES 32G X 4 MM MISC [id number]  USE WITH [name] INSULIN INJECTION DAILY AS INSTRUCTED [name], DO  Active   fluticasone (FLONASE) 50 MCG/ACT nasal spray [id number]  USE 2 SPRAYS IN EACH NOSTRIL ONCE DAILY - USE FOR 4 TO 6 WEEKS, THEN STOP AND USE SEASONALLY OR AS NEEDED. [name], DO  Active   Insulin Glargine ([name] KWIKPEN) 100 UNIT/ML [id number]  DIAL AND INJECT 34 UNITS UNDER THE SKIN ONCE DAILY AT BEDTIME. ( MAX DAILY DOSE IS 34 UNITS)  Patient taking differently: Inject 37 Units into the  skin at bedtime.   Karamalegos, [name], DO  Active   isosorbide mononitrate (IMDUR) 30 MG 24 hr tablet [id number]  Take 0.5 tablets (15 mg total) by mouth daily. [name], MD  Active   lisinopril (ZESTRIL) 40 MG tablet [id number]  TAKE 1 TABLET EVERY DAY Karamalegos, [name], DO  Active   nitroGLYCERIN (NITROSTAT) 0.4 MG SL tablet [id number]  Place 1 tablet (0.4 mg total) under the tongue every 5 (five) minutes as needed for chest pain. [name], MD  Active Spouse/Significant Other, Pharmacy Records           Med Note [facility] Maximiano Coss Nov 08, 2021 10:13 AM)    pantoprazole (PROTONIX) 20 MG tablet [id number]  TAKE 1 TABLET TWICE DAILY BEFORE MEALS (STOP OMEPRAZOLE) [name], DO  Active    RYBELSUS 14 MG TABS [id number] Yes Take 1 tablet (14 mg total) by mouth daily before breakfast. [name], DO Taking Active Spouse/Significant Other, Pharmacy Records           Med Note Curley Spice, [place] A   Wed Oct 13, 2021  8:56 AM)                Assessment/Plan:   Diabetes: - Currently uncontrolled - Reviewed goal A1c, goal fasting, and goal 2 hour post prandial glucose - Reviewed dietary modifications including: - have well-balanced meals throughout the day while controlling carbohydrate portion sizes and limiting nighttime snacking - Recommend to monitor home blood sugar, keep log of results and have this record to review during appointments, but to contact office or CM Pharmacist sooner for readings outside of established parameters or for symptoms Encouraged patient to use glucometer to check blood sugar 2 hours after meals (breakfast, lunch or supper) a couple of times/week to use as feedback on impact of dietary choices on blood sugar - Meets financial criteria for re-enrollment patient assistance program from [place] from [place] from [employer]. Collaborating with provider, Berg, and patient to pursue assistance.      Hypertension: - Reviewed long term cardiovascular and renal outcomes of uncontrolled blood pressure - Reviewed appropriate blood pressure monitoring technique and reviewed goal blood pressure.  - Recommended to check home blood pressure and heart rate and keep a log of the results and to contact office for readings outside of established parameters   Hyperlipidemia/hx of CAD/NSTEMI: - Today patient reports interest in starting Repatha as recommended by PCP, if able to afford the medication From review of [facility] Medicare website, note Repatha is a tier 3 option through patient's health plan (requiring prior authorization)  Note [employer]well Hypercholesterolemia Medicare Access Fund is currently open.  Counsel on process for  applying for assistance through [place], including mechanism of action, side effects, and benefits. Patient denies allergy to latex or rubber. Counseled on potential side effects of runny nose, sore throat, symptoms of the common cold, flu or flu-like symptoms, back pain and redness, pain, or bruising at the injection site. Advised to contact our office with if experiences any side effect that are bothersome or do not go away. Advise patient to seek emergency care if experiences any symptoms of allergic reaction, such as trouble breathing or swallowing, raised bumps (hives), rash or itching, swelling of the face, lips, tongue, throat or arms. Patient verbalized understanding. Will send patient the link for the "How to Use the SureClick Autoinjector" video as well as manufacturer Instructions for Use Handout via MyChart -  Will collaborate with PCP regarding starting patient on Repatha for ASCVD prevention and will follow up with provider/pharmacy/patient to assist with medication access/affordability   Follow Up Plan: Clinical Pharmacist will follow up with patient by telephone again on 04/13/2022 at 8:30 AM     Estelle Grumbles, PharmD, [place], CPP Clinical Pharmacist [facility] [phone]

## 2022-04-01 NOTE — Patient Instructions (Addendum)
Goals Addressed             This Visit's Progress    Pharmacy - Patient Goals       Our goal A1c is less than 7%. This corresponds with fasting sugars less than 130 and 2 hour after meal sugars less than 180. Please check your fasting blood sugar each morning and keep a record of the results  Check your blood pressure once daily, and any time you have concerning symptoms like headache, chest pain, dizziness, shortness of breath, or vision changes.    To appropriately check your blood pressure, make sure you do the following:  1) Avoid caffeine, exercise, or tobacco products for 30 minutes before checking. Empty your bladder. 2) Sit with your back supported in a flat-backed chair. Rest your arm on something flat (arm of the chair, table, etc). 3) Sit still with your feet flat on the floor, resting, for at least 5 minutes.  4) Check your blood pressure. Take 1-2 readings.  5) Write down these readings and bring with you to any provider appointments.  Bring your home blood pressure machine with you to a provider's office for accuracy comparison at least once a year.   Make sure you take your blood pressure medications before you come to any office visit, even if you were asked to fast for labs.   Feel free to call me with any questions or concerns. I look forward to our next call   Wallace Cullens, PharmD, [facility] [phone]

## 2022-04-04 MED ORDER — REPATHA SURECLICK 140 MG/ML ~~LOC~~ SOAJ: 140.0000 mg | SUBCUTANEOUS | 11 refills | Status: DC

## 2022-04-04 NOTE — Addendum Note (Signed)
Addended by: [name] on: 04/04/2022 06:07 PM   Modules accepted: Orders

## 2022-04-06 ENCOUNTER — Ambulatory Visit: Payer: Medicare HMO | Admitting: Pharmacist

## 2022-04-06 DIAGNOSIS — E1169 Type 2 diabetes mellitus with other specified complication: Secondary | ICD-10-CM

## 2022-04-06 DIAGNOSIS — I2510 Atherosclerotic heart disease of native coronary artery without angina pectoris: Secondary | ICD-10-CM

## 2022-04-06 NOTE — Progress Notes (Unsigned)
04/06/2022 Name: [name] MRN: [id number] DOB: November 12, 1950  Chief Complaint  Patient presents with   Medication Assistance    [name] is a 72 y.o. year old male referred to the pharmacist by their PCP for assistance in managing diabetes, hypertension, hyperlipidemia, and medication access.   Outreach to patient today to follow up regarding medication assistance/access for Repatha  Subjective:  Care Team: Primary Care Provider: [name], DO ; Next Scheduled Visit: 09/01/2022 Cardiologist: [name], MD Nephrologist: Murlean Iba, MD  ; Next Scheduled Visit: 06/13/2022 Urology: [facility]  Medication Access/Adherence  Current Pharmacy:  [facility] [address], [place] - [place] [address] [place] [address] Phone: [phone] Fax: [phone]  [place], [place] [address] Phone: [phone] Fax: [phone]  [place], [place] [address] [place] [address] Phone: [phone] Fax: [phone]   Patient reports affordability concerns with their medications: Yes  Patient reports access/transportation concerns to their pharmacy: No  Patient reports adherence concerns with their medications:  No     Hyperlipidemia/hx of CAD/NSTEMI/Medication Assistance:   Current lipid lowering medications: none   Previous therapies tried: atorvastatin 40 mg daily & 10 mg daily (unable to tolerate due to myalgia), ezetimibe, muscle weakness    Have discussed importance of limiting saturated fats and trans fats in diet   Patient has expressed interest in starting Repatha as recommended by PCP, if able to afford the medication   Current medication access support: Today spouse reports she attempted to complete application for Healthwell  Hypercholesterolemia Medicare [employer] online for patient, but ran into an error with submitting the application    Objective: Lab Results  Component Value Date   HGBA1C 7.8 (H) 02/21/2022    Lab Results  Component Value Date   CREATININE 1.46 (H) 02/21/2022   BUN 20 02/21/2022   NA 137 02/21/2022   K 4.6 02/21/2022   CL 103 02/21/2022   CO2 28 02/21/2022    Lab Results  Component Value Date   CHOL 149 02/21/2022   HDL 37 (L) 02/21/2022   LDLCALC 86 02/21/2022   TRIG 160 (H) 02/21/2022   CHOLHDL 4.0 02/21/2022    Medications Reviewed Today     Reviewed by [name], RPH-CPP (Pharmacist) on 04/01/22 at [place] List Status: <None>   Medication Order Taking? Sig Documenting Provider Last Dose Status Informant  ACCU-CHEK SMARTVIEW test strip [id number]  TEST BLOOD SUGAR TWO TIMES DAILY [name], DO  Active   acetaminophen (TYLENOL) 500 MG tablet [id number]  Take 1,000 mg by mouth every 8 (eight) hours as needed.  [provider]  Active Spouse/Significant Other, Pharmacy Records  allopurinol (ZYLOPRIM) 100 MG tablet [id number]  TAKE 1 TABLET AT BEDTIME [name], DO  Active   aspirin EC 81 MG EC tablet [id number]  Take 1 tablet (81 mg total) by mouth daily. Swallow whole. [name], MD  Active Spouse/Significant Other, Pharmacy Records  carvedilol (COREG) 12.5 MG tablet [id number]  TAKE 1 TABLET TWICE DAILY [name], DO  Active   cephALEXin (KEFLEX) 250 MG capsule [id number]  Take 1 capsule (250 mg total) by mouth daily. For UTI prophylaxis [name], DO  Active   clopidogrel (PLAVIX) 75 MG tablet [id number]  Take 1 tablet (75 mg total)  by mouth daily. [name], MD  Active   DROPLET PEN NEEDLES 32G X 4 MM MISC [id number]  USE WITH [name] INSULIN INJECTION DAILY AS INSTRUCTED [name], DO  Active   fluticasone (FLONASE) 50 MCG/ACT nasal spray [id number]  USE 2 SPRAYS IN  EACH NOSTRIL ONCE DAILY - USE FOR 4 TO 6 WEEKS, THEN STOP AND USE SEASONALLY OR AS NEEDED. [name], DO  Active   Insulin Glargine ([name] KWIKPEN) 100 UNIT/ML [id number]  DIAL AND INJECT 34 UNITS UNDER THE SKIN ONCE DAILY AT BEDTIME. ( MAX DAILY DOSE IS 34 UNITS)  Patient taking differently: Inject 37 Units into the skin at bedtime.   Karamalegos, [name], DO  Active   isosorbide mononitrate (IMDUR) 30 MG 24 hr tablet [id number]  Take 0.5 tablets (15 mg total) by mouth daily. [name], MD  Active   lisinopril (ZESTRIL) 40 MG tablet [id number]  TAKE 1 TABLET EVERY DAY Karamalegos, [name], DO  Active   nitroGLYCERIN (NITROSTAT) 0.4 MG SL tablet [id number]  Place 1 tablet (0.4 mg total) under the tongue every 5 (five) minutes as needed for chest pain. [name], MD  Active Spouse/Significant Other, Pharmacy Records           Med Note [facility] Maximiano Coss Nov 08, 2021 10:13 AM)    pantoprazole (PROTONIX) 20 MG tablet [id number]  TAKE 1 TABLET TWICE DAILY BEFORE MEALS (STOP OMEPRAZOLE) [name], DO  Active   RYBELSUS 14 MG TABS [id number] Yes Take 1 tablet (14 mg total) by mouth daily before breakfast. [name], DO Taking Active Spouse/Significant Other, Pharmacy Records           Med Note Curley Spice, [facility] A   Wed Oct 13, 2021  8:56 AM)                Assessment/Plan:   Hyperlipidemia/hx of CAD/NSTEMI/Medication Access: - Today complete prior authorization for Repatha via CoverMyMeds. [place] approved 03/28/2022-03/28/2023 - Spouse confirms received and that she and patient will review Repatha education including "How to Use the SureClick Autoinjector" video as well as manufacturer Instructions for Use Handout via [place] to follow up with [employer] by telephone for determination regarding enrollment for patient in Hypercholesterolemia Medicare [place] to follow up with  patient/spouse again on 1/17 after patient has had a chance to review handout and video and then to address questions prior to him starting the [place]   Follow Up Plan: Clinical Pharmacist will follow up with patient by telephone again on 04/13/2022 at 8:30 AM     Wallace Cullens, PharmD, [place], [place] [phone]

## 2022-04-06 NOTE — Patient Instructions (Addendum)
Please follow up with the [facility] at [phone] regarding applying for enrollment in the Hypercholesterolemia - Medicare [employer].  We will follow up again by phone on 04/13/2022, after you have had the chance to review the materials below, to address any questions you may have prior to starting [place].   Repatha is use to lower LDL (bad cholesterol)   The device is a single-dose disposable pen. This pen can only be used for 1 single injection, and must be discarded after use   The medicine is injected under your skin and can be given by yourself or someone else (caregiver)   The most common side effects of Repatha include side effects of runny nose, sore throat, symptoms of the common cold, flu or flu-like symptoms, back pain and redness, pain, or bruising at the injection site   Tell your healthcare provider if you have any side effect that bothers you or that does not go away.    Stop taking Repatha and call your healthcare provider or seek emergency help right away if you have any of these symptoms: trouble breathing or swallowing, raised bumps (hives), rash or itching, swelling of the face, lips, tongue, throat or arms   In the meantime please review the following How to Use video and Instruction Handout at the following links below and please let me know what questions you have about the medication:   [email or web address]   [email or web address]   Thank you!   Wallace Cullens, PharmD, [place], CPP Clinical Pharmacist [facility] [phone]

## 2022-04-08 ENCOUNTER — Telehealth: Payer: Self-pay | Admitting: Pharmacy Technician

## 2022-04-08 DIAGNOSIS — Z596 Low income: Secondary | ICD-10-CM

## 2022-04-08 NOTE — Progress Notes (Signed)
[facility])                                            [place] Team    04/08/2022  [name] 1950-12-21 [id number]  Care coordination call placed to [name] in regard to Rybelsus application.  Spoke to [name] who informs patient is APPROVED 03/28/22-03/28/23. Medication will ship based on last fill dates in 2023 to the provider's office.  [name], [place]  [phone]

## 2022-04-11 ENCOUNTER — Telehealth: Payer: Self-pay | Admitting: Pharmacy Technician

## 2022-04-11 DIAGNOSIS — Z596 Low income: Secondary | ICD-10-CM

## 2022-04-11 NOTE — Progress Notes (Signed)
[facility])                                            [place] Team    04/11/2022  Hoyte Ziebell 1950/09/30 [id number]  Care coordination call placed to [place] in regard to [facility] application.  Spoke to [place] who informs patient is APPROVED 03/28/22-03/28/23. Patient is set up on auto fill and medication will auto ship and deliver to patient's home based on last fill date in 2023.  Catlin Doria P. Koree Schopf, [place]  [phone]

## 2022-04-13 ENCOUNTER — Ambulatory Visit: Payer: Medicare HMO | Admitting: Pharmacist

## 2022-04-13 DIAGNOSIS — I2510 Atherosclerotic heart disease of native coronary artery without angina pectoris: Secondary | ICD-10-CM

## 2022-04-13 DIAGNOSIS — E1169 Type 2 diabetes mellitus with other specified complication: Secondary | ICD-10-CM

## 2022-04-13 NOTE — Progress Notes (Signed)
04/13/2022 Name: [name] MRN: [id number] DOB: 1950-07-28  Chief Complaint  Patient presents with   Medication Assistance   Medication Management    [name] is a 72 y.o. year old male who presented for a telephone visit.   They were referred to the pharmacist by their PCP for assistance in managing diabetes, hypertension, hyperlipidemia, and medication access.    Subjective:  Care Team: Primary Care Provider: [name], DO ; Next Scheduled Visit: 09/01/2022 Cardiologist: [name], MD Nephrologist: Murlean Iba, MD  ; Next Scheduled Visit: 06/13/2022 Urology: [facility] Urology  Medication Access/Adherence  Current Pharmacy:  [facility] [address] [place] [address] [place] [address] Phone: [phone] Fax: [phone]  [place], [place] [address] Phone: [phone] Fax: [phone]  [place], [place] [address] [place] [address] Phone: [phone] Fax: [phone]   Patient reports affordability concerns with their medications: No  Patient reports access/transportation concerns to their pharmacy: No  Patient reports adherence concerns with their medications:  No    From review of chart, note [facility] CPhT contacted [place] on 04/08/2022 and patient approved for re-enrollment for Rybelsus 03/28/22-03/28/23. [place] CPhT contacted [name] on 04/11/2022 and patient approved for re-enrollment for [name] 03/28/22-03/28/23.  Today patient reports picked up refill of Rybelsus from program from office on 1/9.  Hyperlipidemia/hx of CAD/NSTEMI/Medication Assistance:   Current lipid lowering medications: none   Previous therapies tried: atorvastatin 40 mg daily & 10 mg daily (unable to tolerate due to myalgia), ezetimibe, muscle weakness     Have discussed importance of limiting saturated fats and trans fats in diet   Patient planning to pick up and start Repatha 140 mg injection under the skin once every 2 weeks - PA approved through 03/28/2023   Current medication access support: Today spouse reports patient now enrolled in [place] [employer] Access [employer]     Objective:   Lab Results  Component Value Date   CREATININE 1.46 (H) 02/21/2022   BUN 20 02/21/2022   NA 137 02/21/2022   K 4.6 02/21/2022   CL 103 02/21/2022   CO2 28 02/21/2022    Lab Results  Component Value Date   CHOL 149 02/21/2022   HDL 37 (L) 02/21/2022   LDLCALC 86 02/21/2022   TRIG 160 (H) 02/21/2022   CHOLHDL 4.0 02/21/2022    Medications Reviewed Today     Reviewed by [name], RPH-CPP (Pharmacist) on 04/13/22 at 2119  Med List Status: <None>   Medication Order Taking? Sig Documenting Provider Last Dose Status Informant  ACCU-CHEK SMARTVIEW test strip [id number] No TEST BLOOD SUGAR TWO TIMES DAILY [name] [name], DO Taking Active   acetaminophen (TYLENOL) 500 MG tablet [id number] No Take 1,000 mg by mouth every 8 (eight) hours as needed.  [provider] Taking Active Spouse/Significant Other, Pharmacy Records  allopurinol (ZYLOPRIM) 100 MG tablet [id number] No TAKE 1 TABLET AT BEDTIME [name], DO Taking Active   aspirin EC 81 MG EC tablet [id number] No Take 1 tablet (81 mg total) by mouth daily. Swallow whole. [name], MD Taking Active Spouse/Significant Other, Pharmacy Records  carvedilol (COREG) 12.5 MG tablet [id number] No TAKE 1 TABLET TWICE DAILY [name], DO Taking Active   cephALEXin (KEFLEX) 250 MG capsule [id number]  Take 1  capsule (250 mg total) by mouth daily. For UTI prophylaxis [name], DO  Active   clopidogrel (PLAVIX) 75 MG tablet [id number] No Take 1 tablet (75 mg total) by mouth daily. [name], MD Taking  Active   DROPLET PEN NEEDLES 32G X 4 MM MISC [id number] No USE WITH [name] INSULIN INJECTION DAILY AS INSTRUCTED [name], DO Taking Active   fluticasone (FLONASE) 50 MCG/ACT nasal spray [id number] No USE 2 SPRAYS IN EACH NOSTRIL ONCE DAILY - USE FOR 4 TO 6 WEEKS, THEN STOP AND USE SEASONALLY OR AS NEEDED. [name], DO Taking Active   Insulin Glargine [facility] KWIKPEN) 100 UNIT/ML [id number] No DIAL AND INJECT 34 UNITS UNDER THE SKIN ONCE DAILY AT BEDTIME. ( MAX DAILY DOSE IS 34 UNITS)  Patient taking differently: Inject 37 Units into the skin at bedtime.   [name], DO Taking Active   isosorbide mononitrate (IMDUR) 30 MG 24 hr tablet [id number] No Take 0.5 tablets (15 mg total) by mouth daily. [name], MD Taking Active   lisinopril (ZESTRIL) 40 MG tablet [id number] No TAKE 1 TABLET EVERY DAY Karamalegos, [name], DO Taking Active   nitroGLYCERIN (NITROSTAT) 0.4 MG SL tablet [id number] No Place 1 tablet (0.4 mg total) under the tongue every 5 (five) minutes as needed for chest pain. [name], MD Taking Active Spouse/Significant Other, Pharmacy Records           Med Note [facility] Maximiano Coss Nov 08, 2021 10:13 AM)    pantoprazole (PROTONIX) 20 MG tablet [id number] No TAKE 1 TABLET TWICE DAILY BEFORE MEALS (STOP OMEPRAZOLE) [name], DO Taking Active   REPATHA SURECLICK 546 MG/ML SOAJ [id number]  Inject 140 mg into the skin every 14 (fourteen) days. [name], DO  Active   RYBELSUS 14 MG TABS [id number] No Take 1 tablet (14 mg total) by mouth daily before breakfast. [name], DO Taking Active Spouse/Significant Other, Pharmacy Records           Med Note Curley Spice, [facility] A   Wed Oct 13, 2021  8:56 AM)                Assessment/Plan:   Hyperlipidemia/hx of CAD/NSTEMI/Medication Access: - Patient confirms reviewed [place] education including "How to Use the SureClick  Autoinjector" video as well as manufacturer Instructions for Use Handout via MyChart  Follow up today regarding administration technique and patient denies any further questions - Counsel on how to use [employer] information at pharmacy (to be billed secondary after patient's Medicare prescription coverage)   Follow Up Plan: Clinical Pharmacist will follow up with patient by telephone again on 05/11/2022 at 8:30 AM      Wallace Cullens, PharmD, [place], [place] [phone]

## 2022-04-13 NOTE — Patient Instructions (Signed)
Goals Addressed             This Visit's Progress    Pharmacy - Patient Goals       Our goal A1c is less than 7%. This corresponds with fasting sugars less than 130 and 2 hour after meal sugars less than 180. Please check your fasting blood sugar each morning and keep a record of the results  Check your blood pressure once daily, and any time you have concerning symptoms like headache, chest pain, dizziness, shortness of breath, or vision changes.   Our goal is less than 130/80.  To appropriately check your blood pressure, make sure you do the following:  1) Avoid caffeine, exercise, or tobacco products for 30 minutes before checking. Empty your bladder. 2) Sit with your back supported in a flat-backed chair. Rest your arm on something flat (arm of the chair, table, etc). 3) Sit still with your feet flat on the floor, resting, for at least 5 minutes.  4) Check your blood pressure. Take 1-2 readings.  5) Write down these readings and bring with you to any provider appointments.  Bring your home blood pressure machine with you to a provider's office for accuracy comparison at least once a year.   Make sure you take your blood pressure medications before you come to any office visit, even if you were asked to fast for labs.   Feel free to call me with any questions or concerns. I look forward to our next call  Christiano Blandon Ballard Budney, PharmD, BCACP Clinical Pharmacist [facility] [place] [phone]          

## 2022-05-09 DIAGNOSIS — N186 End stage renal disease: Secondary | ICD-10-CM | POA: Diagnosis not present

## 2022-05-09 DIAGNOSIS — N281 Cyst of kidney, acquired: Secondary | ICD-10-CM | POA: Diagnosis not present

## 2022-05-11 ENCOUNTER — Ambulatory Visit: Payer: Medicare HMO | Admitting: Pharmacist

## 2022-05-11 DIAGNOSIS — Z794 Long term (current) use of insulin: Secondary | ICD-10-CM

## 2022-05-11 DIAGNOSIS — E1169 Type 2 diabetes mellitus with other specified complication: Secondary | ICD-10-CM

## 2022-05-11 DIAGNOSIS — I129 Hypertensive chronic kidney disease with stage 1 through stage 4 chronic kidney disease, or unspecified chronic kidney disease: Secondary | ICD-10-CM

## 2022-05-11 NOTE — Patient Instructions (Signed)
Goals Addressed             This Visit's Progress    Pharmacy - Patient Goals       Our goal A1c is less than 7%. This corresponds with fasting sugars less than 130 and 2 hour after meal sugars less than 180. Please check your fasting blood sugar each morning and keep a record of the results  Check your blood pressure once daily, and any time you have concerning symptoms like headache, chest pain, dizziness, shortness of breath, or vision changes.   Our goal is less than 130/80.  To appropriately check your blood pressure, make sure you do the following:  1) Avoid caffeine, exercise, or tobacco products for 30 minutes before checking. Empty your bladder. 2) Sit with your back supported in a flat-backed chair. Rest your arm on something flat (arm of the chair, table, etc). 3) Sit still with your feet flat on the floor, resting, for at least 5 minutes.  4) Check your blood pressure. Take 1-2 readings.  5) Write down these readings and bring with you to any provider appointments.  Bring your home blood pressure machine with you to a provider's office for accuracy comparison at least once a year.   Make sure you take your blood pressure medications before you come to any office visit, even if you were asked to fast for labs.   Feel free to call me with any questions or concerns. I look forward to our next call   Wallace Cullens, PharmD, [facility] [place] [phone]

## 2022-05-11 NOTE — Progress Notes (Signed)
05/11/2022 Name: [name] MRN: [id number] DOB: Mar 02, 1951  Chief Complaint  Patient presents with   Medication Management    [name] is a 72 y.o. year old male who presented for a telephone visit.   They were referred to the pharmacist by their PCP for assistance in managing diabetes, hypertension, hyperlipidemia, and medication access.    Subjective:  Care Team: Primary Care Provider: [name], DO ; Next Scheduled Visit: 09/01/2022 Cardiologist: [name], MD Nephrologist: Murlean Iba, MD  ; Next Scheduled Visit: 06/09/2022 Urology: [facility] Urology  Medication Access/Adherence  Current Pharmacy:  [facility] [address] [place] [address] [place] [address] Phone: [phone] Fax: [phone]  [place], [place] [address] Phone: [phone] Fax: [phone]  [place], [place] [address] [place] [address] [place] [address] Phone: [phone] Fax: [phone]   Patient reports affordability concerns with their medications: No  Patient reports access/transportation concerns to their pharmacy: No  Patient reports adherence concerns with their medications:  No    Patient using weekly pillbox; denies missed doses  Diabetes:   Current medications:  Rybelsus 14 mg daily Taking ?30 minutes before the first food, beverage, or other oral medications of the day with ?4 oz of plain water  Insulin glargine ([name]/Lantus) 37 units QHS   Medications tried in the past:  Trulicity (GI intolerance) Metformin (unable to tolerate - GI side effects) Bydureon (difficulty with device/administration) Invokana (felt dehydrated)   Current glucose readings:   Morning Fasting Notes  7 - February 106   8 - February 146   9 - February 145   10 -  February -   11 - February -   12 - February 131   13 - February 224* *Went steak house night prior (eating large portions of carbohydrates  14 - February 95    *Attributes recent variation in blood sugar readings to dietary inconsistencies (occasionally larger carbohydrate portion sizes at meals or snacking overnight   Patient denies hypoglycemic s/sx including dizziness, shakiness, sweating             Again encourage patient to obtain and carry glucose tablets   Current physical activity: stays active throughout the day working and walking some at home   Again discuss benefits of continuous blood glucose monitor, but patient declined interest in CGM at this time   Current medication access support:  -Collaborating with [place] for assistance with applying for re-enrollment for for [employer] from [place] from [employer] patient assistance programs   Hypertension:   Current medications:  Carvedilol 12.5 mg twice daily Lisinopril 40 mg daily  Imdur 30 mg -1/2 tablet (15 mg) daily   Patient has an automated, upper arm home BP cuff Current blood pressure readings readings: Today: 160/88, HR 53 2/12: 166/88, HR 64 However, reports readings taken immediately after getting up, prior to his BP medicine, while chewing tobacco and right after drinking a diet cola   Admits to regularly adding salt to his food  Denies symptoms of hypotension   Current physical activity: stays active throughout the day working and walking some at home   Hyperlipidemia/hx of CAD/NSTEMI:   Current lipid lowering medications: Repatha 140 mg every 14 days  Reports tolerating well  Note prior authorization for Repatha approved through 03/28/2023  Previous therapies tried: atorvastatin 40 mg daily & 10 mg daily (unable to tolerate due to myalgia), ezetimibe    Have discussed importance of limiting saturated fats and trans fats in diet   Antiplatelet regimen: Aspirin 81 mg  daily Clopidogrel 75 mg daily   Current physical activity: stays active throughout the day working and walking some at home     Objective:  Lab Results  Component Value Date   HGBA1C 7.8 (H) 02/21/2022    Lab Results  Component Value Date   CREATININE 1.46 (H) 02/21/2022   BUN 20 02/21/2022   NA 137 02/21/2022   K 4.6 02/21/2022   CL 103 02/21/2022   CO2 28 02/21/2022    Lab Results  Component Value Date   CHOL 149 02/21/2022   HDL 37 (L) 02/21/2022   LDLCALC 86 02/21/2022   TRIG 160 (H) 02/21/2022   CHOLHDL 4.0 02/21/2022   BP Readings from Last 3 Encounters:  03/04/22 134/70  03/02/22 118/74  11/26/21 123/60   Pulse Readings from Last 3 Encounters:  03/02/22 85  11/26/21 82  11/25/21 79     Medications Reviewed Today     Reviewed by [name], RPH-CPP (Pharmacist) on 04/13/22 at 2119  Med List Status: <None>   Medication Order Taking? Sig Documenting Provider Last Dose Status Informant  ACCU-CHEK SMARTVIEW test strip [id number] No TEST BLOOD SUGAR TWO TIMES DAILY [name] [name], DO Taking Active   acetaminophen (TYLENOL) 500 MG tablet [id number] No Take 1,000 mg by mouth every 8 (eight) hours as needed.  [provider] Taking Active Spouse/Significant Other, Pharmacy Records  allopurinol (ZYLOPRIM) 100 MG tablet [id number] No TAKE 1 TABLET AT BEDTIME [name], DO Taking Active   aspirin EC 81 MG EC tablet [id number] No Take 1 tablet (81 mg total) by mouth daily. Swallow whole. [name], MD Taking Active Spouse/Significant Other, Pharmacy Records  carvedilol (COREG) 12.5 MG tablet [id number] No TAKE 1 TABLET TWICE DAILY Karamalegos, [name], DO Taking Active   cephALEXin (KEFLEX) 250 MG capsule [id number]  Take 1 capsule (250 mg total) by mouth daily. For UTI prophylaxis [name], DO  Active   clopidogrel (PLAVIX) 75 MG tablet [id number] No Take 1 tablet (75 mg total) by mouth daily.  [name], MD Taking Active   DROPLET PEN NEEDLES 32G X 4 MM MISC [id number] No USE WITH [name] INSULIN INJECTION DAILY AS INSTRUCTED [name], DO Taking Active   fluticasone (FLONASE) 50 MCG/ACT nasal spray [id number] No USE 2 SPRAYS IN EACH NOSTRIL ONCE DAILY - USE FOR 4 TO 6 WEEKS, THEN STOP AND USE SEASONALLY OR AS NEEDED. [name], DO Taking Active   Insulin Glargine [facility] KWIKPEN) 100 UNIT/ML [id number] No DIAL AND INJECT 34 UNITS UNDER THE SKIN ONCE DAILY AT BEDTIME. ( MAX DAILY DOSE IS 34 UNITS)  Patient taking differently: Inject 37 Units into the skin at bedtime.   [name], DO Taking Active   isosorbide mononitrate (IMDUR) 30 MG 24 hr tablet [id number] No Take 0.5 tablets (15 mg total) by mouth daily. [name], MD Taking Active   lisinopril (ZESTRIL) 40 MG tablet [id number] No TAKE 1 TABLET EVERY DAY Karamalegos, [name], DO Taking Active   nitroGLYCERIN (NITROSTAT) 0.4 MG SL tablet [id number] No Place 1 tablet (0.4 mg total) under the tongue every 5 (five) minutes as needed for chest pain. [name], MD Taking Active Spouse/Significant Other, Pharmacy Records  Med Note [facility], BRANDY L   Mon Nov 08, 2021 10:13 AM)    pantoprazole (PROTONIX) 20 MG tablet [id number] No TAKE 1 TABLET TWICE DAILY BEFORE MEALS (STOP OMEPRAZOLE) [name], DO Taking Active   REPATHA SURECLICK [id number] MG/ML SOAJ [id number]  Inject 140 mg into the skin every 14 (fourteen) days. [name], DO  Active   RYBELSUS 14 MG TABS [id number] No Take 1 tablet (14 mg total) by mouth daily before breakfast. [name], DO Taking Active Spouse/Significant Other, Pharmacy Records           Med Note Curley Spice, [facility] A   Wed Oct 13, 2021  8:56 AM)                Assessment/Plan:   Diabetes: - Currently uncontrolled - Reviewed goal A1c, goal fasting, and goal 2 hour post prandial  glucose - Reviewed dietary modifications including: - have well-balanced meals throughout the day while controlling carbohydrate portion sizes and limiting nighttime snacking - Again encourage patient to obtain glucose tablets to carry with him in case of hypoglycemia - Recommend to monitor home blood sugar, keep log of results and have this record to review during appointments, but to contact office or CM Pharmacist sooner for readings outside of established parameters or for symptoms Encouraged patient to use glucometer to check blood sugar 2 hours after meals (breakfast, lunch or supper) a couple of times/week to use as feedback on impact of dietary choices on blood sugar     Hypertension: - Reviewed long term cardiovascular and renal outcomes of uncontrolled blood pressure - Reviewed appropriate blood pressure monitoring technique and reviewed goal blood pressure.  - Encourage dietary changes, including limiting salt/sodium intake and increasing intake of fruit and vegetables - Recommended to check home blood pressure and heart rate and keep a log of the results and to contact office for readings outside of established parameters       Follow Up Plan: Clinical Pharmacist will follow up with patient by telephone again on 05/27/2022 at 9:15 am for blood pressure readings   Wallace Cullens, PharmD, [place], [place] [phone]

## 2022-05-27 ENCOUNTER — Ambulatory Visit: Payer: Medicare HMO | Admitting: Pharmacist

## 2022-05-27 DIAGNOSIS — N183 Chronic kidney disease, stage 3 unspecified: Secondary | ICD-10-CM

## 2022-05-27 NOTE — Progress Notes (Signed)
05/27/2022 Name: [name] MRN: [id number] DOB: 24-Oct-1950  Chief Complaint  Patient presents with   Medication Management    Hypertension    [name] is a 72 y.o. year old male who presented for a telephone visit to follow up for blood pressure readings   They were referred to the pharmacist by their PCP for assistance in managing diabetes, hypertension, hyperlipidemia, and medication access.    Subjective:  Care Team: Primary Care Provider: [name], DO ; Next Scheduled Visit: 09/01/2022 Cardiologist: [name], MD Nephrologist: Murlean Iba, MD  ; Next Scheduled Visit: 06/09/2022 Urology: [facility]  Medication Access/Adherence  Current Pharmacy:  [facility] [address], [place] - [place] [address] [place] [address] Phone: [phone] Fax: [phone]  [place], [place] [address] Phone: [phone] Fax: [phone]  [place], [place] [place] [address] [place] [address] Phone: [phone] Fax: [phone]   Patient reports affordability concerns with their medications: No  Patient reports access/transportation concerns to their pharmacy: No  Patient reports adherence concerns with their medications:  No     Patient using weekly pillbox; denies missed doses  Hypertension:   Current medications:  Carvedilol 12.5 mg twice daily Lisinopril 40 mg daily  Imdur 30 mg -1/2 tablet (15 mg) daily   Patient has an automated, upper arm home BP cuff  Current blood pressure readings readings: Today: 147/73, HR 61 (checked before taking his blood pressure medicine) Last night: 140/70, HR 66 (checked after returned home from work)  Following up today for blood pressure readings from the past 2 weeks. However, patient reports having only  checked last night and this morning   Admits to adding salt to his food, but has reduced amount    Current physical activity: stays active throughout the day working and walking some at home    Objective:  Lab Results  Component Value Date   CREATININE 1.46 (H) 02/21/2022   BUN 20 02/21/2022   NA 137 02/21/2022   K 4.6 02/21/2022   CL 103 02/21/2022   CO2 28 02/21/2022    BP Readings from Last 3 Encounters:  03/04/22 134/70  03/02/22 118/74  11/26/21 123/60   Pulse Readings from Last 3 Encounters:  03/02/22 85  11/26/21 82  11/25/21 79     Medications Reviewed Today     Reviewed by [name], RPH-CPP (Pharmacist) on 05/27/22 at [phone]  Med List Status: <None>   Medication Order Taking? Sig Documenting Provider Last Dose Status Informant  ACCU-CHEK SMARTVIEW test strip [id number]  TEST BLOOD SUGAR TWO TIMES DAILY [name], DO  Active   acetaminophen (TYLENOL) 500 MG tablet [id number]  Take 1,000 mg by mouth every 8 (eight) hours as needed.  [provider]  Active Spouse/Significant Other, Pharmacy Records  allopurinol (ZYLOPRIM) 100 MG tablet [id number]  TAKE 1 TABLET AT BEDTIME [name], DO  Active   aspirin EC 81 MG EC tablet [id number]  Take 1 tablet (81 mg total) by mouth daily. Swallow whole. [name], MD  Active Spouse/Significant Other, Pharmacy Records  carvedilol (COREG) 12.5 MG tablet [id number] Yes TAKE 1 TABLET TWICE DAILY Karamalegos, [name], DO Taking Active   cephALEXin (KEFLEX) 250 MG capsule [id number]  Take 1 capsule (250 mg total) by mouth daily. For UTI prophylaxis  [name], DO  Active   clopidogrel (PLAVIX) 75 MG tablet [id number]  Take 1 tablet (75 mg total) by mouth daily. [name], MD  Active   DROPLET PEN NEEDLES 32G X 4 MM MISC [id number]  USE WITH [name] INSULIN INJECTION DAILY AS INSTRUCTED [name], DO  Active   fluticasone (FLONASE) 50 MCG/ACT  nasal spray [id number]  USE 2 SPRAYS IN EACH NOSTRIL ONCE DAILY - USE FOR 4 TO 6 WEEKS, THEN STOP AND USE SEASONALLY OR AS NEEDED. [name], DO  Active   Insulin Glargine ([name] KWIKPEN) 100 UNIT/ML [id number]  DIAL AND INJECT 34 UNITS UNDER THE SKIN ONCE DAILY AT BEDTIME. ( MAX DAILY DOSE IS 34 UNITS)  Patient taking differently: Inject 37 Units into the skin at bedtime.   Karamalegos, [name], DO  Active   isosorbide mononitrate (IMDUR) 30 MG 24 hr tablet [id number] Yes Take 0.5 tablets (15 mg total) by mouth daily. [name], MD Taking Active   lisinopril (ZESTRIL) 40 MG tablet [id number] Yes TAKE 1 TABLET EVERY DAY Karamalegos, [name], DO Taking Active   nitroGLYCERIN (NITROSTAT) 0.4 MG SL tablet [id number]  Place 1 tablet (0.4 mg total) under the tongue every 5 (five) minutes as needed for chest pain. [name], MD  Active Spouse/Significant Other, Pharmacy Records           Med Note [facility] Maximiano Coss Nov 08, 2021 10:13 AM)    pantoprazole (PROTONIX) 20 MG tablet [id number]  TAKE 1 TABLET TWICE DAILY BEFORE MEALS (STOP OMEPRAZOLE) [name], DO  Active   REPATHA SURECLICK [id number] MG/ML SOAJ [id number]  Inject 140 mg into the skin every 14 (fourteen) days. [name], DO  Active   RYBELSUS 14 MG TABS [id number]  Take 1 tablet (14 mg total) by mouth daily before breakfast. [name], DO  Active Spouse/Significant Other, Pharmacy Records           Med Note Curley Spice, [facility] A   Wed Oct 13, 2021  8:56 AM)                Assessment/Plan:   Hypertension: - Reviewed long term cardiovascular and renal outcomes of uncontrolled blood pressure - Reviewed appropriate blood pressure monitoring technique/timing and reviewed goal blood pressure.  - Encourage dietary changes, including continuing to limit salt/sodium intake - Recommended to check home blood pressure and heart rate and keep a log of the  results and to contact office for readings outside of established parameters       Follow Up Plan: Clinical Pharmacist will follow up with patient by telephone again on 07/08/2022 at 8:30 AM    Wallace Cullens, PharmD, [place], [place] [phone]

## 2022-05-27 NOTE — Patient Instructions (Signed)
Goals Addressed             This Visit's Progress    Pharmacy - Patient Goals       Our goal A1c is less than 7%. This corresponds with fasting sugars less than 130 and 2 hour after meal sugars less than 180. Please check your fasting blood sugar each morning and keep a record of the results  Check your blood pressure once daily, and any time you have concerning symptoms like headache, chest pain, dizziness, shortness of breath, or vision changes.   Our goal is less than 130/80.  To appropriately check your blood pressure, make sure you do the following:  1) Avoid caffeine, exercise, or tobacco products for 30 minutes before checking. Empty your bladder. 2) Sit with your back supported in a flat-backed chair. Rest your arm on something flat (arm of the chair, table, etc). 3) Sit still with your feet flat on the floor, resting, for at least 5 minutes.  4) Check your blood pressure. Take 1-2 readings.  5) Write down these readings and bring with you to any provider appointments.  Bring your home blood pressure machine with you to a provider's office for accuracy comparison at least once a year.   Make sure you take your blood pressure medications before you come to any office visit, even if you were asked to fast for labs.   Feel free to call me with any questions or concerns. I look forward to our next call  Wallace Cullens, PharmD, [facility] [place] [phone]

## 2022-06-09 DIAGNOSIS — N1831 Chronic kidney disease, stage 3a: Secondary | ICD-10-CM | POA: Diagnosis not present

## 2022-06-09 DIAGNOSIS — I10 Essential (primary) hypertension: Secondary | ICD-10-CM | POA: Diagnosis not present

## 2022-06-09 DIAGNOSIS — E1122 Type 2 diabetes mellitus with diabetic chronic kidney disease: Secondary | ICD-10-CM | POA: Diagnosis not present

## 2022-07-08 ENCOUNTER — Ambulatory Visit: Payer: Medicare HMO | Admitting: Pharmacist

## 2022-07-08 DIAGNOSIS — N183 Chronic kidney disease, stage 3 unspecified: Secondary | ICD-10-CM

## 2022-07-08 DIAGNOSIS — E1169 Type 2 diabetes mellitus with other specified complication: Secondary | ICD-10-CM

## 2022-07-08 NOTE — Patient Instructions (Signed)
Goals Addressed             This Visit's Progress    Pharmacy - Patient Goals       Our goal A1c is less than 7%. This corresponds with fasting sugars less than 130 and 2 hour after meal sugars less than 180. Please check your fasting blood sugar each morning and keep a record of the results  Check your blood pressure once daily, and any time you have concerning symptoms like headache, chest pain, dizziness, shortness of breath, or vision changes.   Our goal is less than 130/80.  To appropriately check your blood pressure, make sure you do the following:  1) Avoid caffeine, exercise, or tobacco products for 30 minutes before checking. Empty your bladder. 2) Sit with your back supported in a flat-backed chair. Rest your arm on something flat (arm of the chair, table, etc). 3) Sit still with your feet flat on the floor, resting, for at least 5 minutes.  4) Check your blood pressure. Take 1-2 readings.  5) Write down these readings and bring with you to any provider appointments.  Bring your home blood pressure machine with you to a provider's office for accuracy comparison at least once a year.   Make sure you take your blood pressure medications before you come to any office visit, even if you were asked to fast for labs.   Feel free to call me with any questions or concerns. I look forward to our next call  Kayton Ripp Estuardo Frisbee, PharmD, BCACP Clinical Pharmacist [facility] [place] [phone]          

## 2022-07-08 NOTE — Progress Notes (Signed)
07/08/2022 Name: [name] MRN: [id number] DOB: 1951/01/11  Chief Complaint  Patient presents with   Medication Management   Medication Assistance    [name] is a 72 y.o. year old male who presented for a telephone visit.   They were referred to the pharmacist by their PCP for assistance in managing diabetes, hypertension, hyperlipidemia, and medication access.    Subjective:  Care Team: Primary Care Provider: [name], DO ; Next Scheduled Visit: 09/01/2022 Cardiologist: Daniel Odea, MD Nephrologist: Daniel Pigeon, MD  ; Next Scheduled Visit: 09/15/2022 Berg: [name]  Medication Access/Adherence  Current Pharmacy:  [facility] [address], [place] - [address] [place] [address] Phone: [phone] Fax: [phone]  [facility] - [place], [place] - [address] [place] [address] Phone: [phone] Fax: [phone]  [facility] - [place], [place] - [address] [place] [address] Phone: [phone] Fax: [phone]   Patient reports affordability concerns with their medications: No  Patient reports access/transportation concerns to their pharmacy: No  Patient reports adherence concerns with their medications:  No     Patient using weekly pillbox  Today reports stopped cephalexin ~3-4 weeks ago as directed by Berg and having no further urinary symptoms    Diabetes:   Current medications:  Rybelsus 14 mg daily Taking ?30 minutes before the first food, beverage, or other oral medications of the day with ?4 oz of plain water  Insulin glargine ([name]/Lantus) 37 units QHS   Medications tried in the past:  Trulicity (GI intolerance) Metformin (unable to tolerate - GI side effects) Bydureon (difficulty with device/administration) Invokana (felt dehydrated)    Current glucose readings:   Morning Fasting Notes  4 - April 137   5 - April 113   6 - April -   7 - April -   8 - April 172* *Returning from vacation (snacking overnight)  9 - April 115   10 - April 108   11 - April 101   12 - April 149     Patient denies hypoglycemic s/sx including dizziness, shakiness, sweating             Again encourage patient to obtain and carry glucose tablets   Current physical activity: stays active throughout the day working and walking some at home   Next eye exam scheduled for October 2024  Current medication access support:  - Enrolled in patient assistance for for [employer] from [employer] and Rybelsus from [employer] patient assistance programs through 03/28/2023    Hypertension:   Current medications:  Carvedilol 12.5 mg twice daily Lisinopril 40 mg daily  Imdur 30 mg -1/2 tablet (15 mg) daily   Patient has an automated, upper arm home BP cuff Denies checking home blood pressure recently, but note checked at office visit with Nephrologist on 3/14, reading: 113/72, HR 80   Reports working on cutting back on salt intake   Denies symptoms of hypotension   Current physical activity: stays active throughout the day working and walking some at home    Hyperlipidemia/hx of CAD/NSTEMI:   Current lipid lowering medications: Repatha 140 mg every 14 days             Note prior authorization for Repatha approved through 03/28/2023  Today reports recently started to have weakness in his legs similar to how he felt on statin  therapy. Reports he has skipped his latest dose of Repatha to see if there is a connection. States plans to hold off on taking until next dose is due, but will restart then if weakness not improved.  - States if weakness is improved, will follow up with PCP to discuss further   Previous therapies tried: atorvastatin 40 mg daily & 10 mg daily (unable to tolerate due to myalgia), ezetimibe    Have discussed importance of limiting  saturated fats and trans fats in diet   Antiplatelet regimen: Aspirin 81 mg daily Clopidogrel 75 mg daily   Current physical activity: stays active throughout the day working and walking some at home       Objective:  Lab Results  Component Value Date   HGBA1C 7.8 (H) 02/21/2022    Lab Results  Component Value Date   CREATININE 1.46 (H) 02/21/2022   BUN 20 02/21/2022   NA 137 02/21/2022   K 4.6 02/21/2022   CL 103 02/21/2022   CO2 28 02/21/2022    Lab Results  Component Value Date   CHOL 149 02/21/2022   HDL 37 (L) 02/21/2022   LDLCALC 86 02/21/2022   TRIG 160 (H) 02/21/2022   CHOLHDL 4.0 02/21/2022   BP Readings from Last 3 Encounters:  03/04/22 134/70  03/02/22 118/74  11/26/21 123/60   Pulse Readings from Last 3 Encounters:  03/02/22 85  11/26/21 82  11/25/21 79     Medications Reviewed Today     Reviewed by [name], [name] (Pharmacist) on 05/27/22 at [phone]  Med List Status: <None>   Medication Order Taking? Sig Documenting Provider Last Dose Status Informant  ACCU-CHEK SMARTVIEW test strip [id number]  TEST BLOOD SUGAR TWO TIMES DAILY Daniel Cords, DO  Active   acetaminophen (TYLENOL) 500 MG tablet [id number]  Take 1,000 mg by mouth every 8 (eight) hours as needed.  [provider]  Active Spouse/Significant Other, Pharmacy Records  allopurinol (ZYLOPRIM) 100 MG tablet [id number]  TAKE 1 TABLET AT BEDTIME Daniel Cords, DO  Active   aspirin EC 81 MG EC tablet [id number]  Take 1 tablet (81 mg total) by mouth daily. Swallow whole. Daniel Highland, MD  Active Spouse/Significant Other, Pharmacy Records  carvedilol (COREG) 12.5 MG tablet [id number] Yes TAKE 1 TABLET TWICE DAILY [name], Daniel Neat, DO Taking Active   cephALEXin (KEFLEX) 250 MG capsule [id number]  Take 1 capsule (250 mg total) by mouth daily. For UTI prophylaxis Daniel Cords, DO  Active   clopidogrel (PLAVIX) 75 MG tablet [id number]  Take 1  tablet (75 mg total) by mouth daily. Daniel Odea, MD  Active   DROPLET PEN NEEDLES 32G X 4 MM MISC [id number]  USE WITH [name] INSULIN INJECTION DAILY AS INSTRUCTED Daniel Cords, DO  Active   fluticasone (FLONASE) 50 MCG/ACT nasal spray [id number]  USE 2 SPRAYS IN EACH NOSTRIL ONCE DAILY - USE FOR 4 TO 6 WEEKS, THEN STOP AND USE SEASONALLY OR AS NEEDED. Daniel Cords, DO  Active   Insulin Glargine ([name] KWIKPEN) 100 UNIT/ML [id number]  DIAL AND INJECT 34 UNITS UNDER THE SKIN ONCE DAILY AT BEDTIME. ( MAX DAILY DOSE IS 34 UNITS)  Patient taking differently: Inject 37 Units into the skin at bedtime.   [name], Daniel Neat, DO  Active   isosorbide mononitrate (IMDUR) 30 MG 24 hr tablet [id number] Yes Take 0.5 tablets (15 mg total) by mouth daily. Daniel Odea, MD Taking Active   lisinopril (ZESTRIL) 40 MG tablet  [id number] Yes TAKE 1 TABLET EVERY DAY [name], Daniel Neat, DO Taking Active   nitroGLYCERIN (NITROSTAT) 0.4 MG SL tablet [id number]  Place 1 tablet (0.4 mg total) under the tongue every 5 (five) minutes as needed for chest pain. Daniel Highland, MD  Active Spouse/Significant Other, Pharmacy Records           Med Note [facility] [place] Nov 08, 2021 10:13 AM)    pantoprazole (PROTONIX) 20 MG tablet [id number]  TAKE 1 TABLET TWICE DAILY BEFORE MEALS (STOP OMEPRAZOLE) Daniel Cords, DO  Active   REPATHA SURECLICK 140 MG/ML SOAJ [id number]  Inject 140 mg into the skin every 14 (fourteen) days. Daniel Cords, DO  Active   RYBELSUS 14 MG TABS [id number]  Take 1 tablet (14 mg total) by mouth daily before breakfast. Daniel Cords, DO  Active Spouse/Significant Other, Pharmacy Records           Med Note Ronney Asters, [facility] A   Wed Oct 13, 2021  8:56 AM)                Assessment/Plan:   Diabetes: - Have reviewed goal A1c, goal fasting, and goal 2 hour post prandial glucose - Reviewed dietary  modifications including: - have well-balanced meals throughout the day while controlling carbohydrate portion sizes and limiting nighttime snacking - Again encourage patient to obtain glucose tablets to carry with him in case of hypoglycemia - Recommend to monitor home blood sugar, keep log of results and have this record to review during appointments, but to contact office or CM Pharmacist sooner for readings outside of established parameters or for symptoms Have encouraged patient to use glucometer to check blood sugar 2 hours after meals (breakfast, lunch or supper) a couple of times/week to use as feedback on impact of dietary choices on blood sugar     Hypertension: - Reviewed long term cardiovascular and renal outcomes of uncontrolled blood pressure - Reviewed appropriate blood pressure monitoring technique and reviewed goal blood pressure.  - Encourage dietary changes, including limiting salt/sodium intake and increasing intake of fruit and vegetables - Recommended to check home blood pressure and heart rate and keep a log of the results and to contact office for readings outside of established parameters   Hyperlipidemia: - Patient recently skipped latest dose of Repatha as wanting to see if Repatha connected to recent weakness in his legs (similar to when on statin therapy). Reports if symptom not improved, plans to restart Repatha. If symptom improves, plans to contact PCP to discuss further.  Update sent to PCP    Follow Up Plan: Clinical Pharmacist will follow up with patient by telephone again on 09/30/2022 at 8:30 am  Estelle Grumbles, PharmD, P[place]y [place], CPP Clinical Pharmacist [facility] [phone]

## 2022-07-22 ENCOUNTER — Encounter: Payer: Self-pay | Admitting: Physician Assistant

## 2022-07-22 ENCOUNTER — Ambulatory Visit (INDEPENDENT_AMBULATORY_CARE_PROVIDER_SITE_OTHER): Payer: Medicare HMO | Admitting: Physician Assistant

## 2022-07-22 VITALS — BP 159/82 | HR 87 | Temp 99.2°F

## 2022-07-22 DIAGNOSIS — J069 Acute upper respiratory infection, unspecified: Secondary | ICD-10-CM | POA: Diagnosis not present

## 2022-07-22 MED ORDER — BENZONATATE 200 MG PO CAPS: 200.0000 mg | ORAL_CAPSULE | Freq: Two times a day (BID) | ORAL | 0 refills | Status: DC | PRN

## 2022-07-22 NOTE — Patient Instructions (Addendum)
Based on your described symptoms and the duration of symptoms it is likely that you have a viral upper respiratory infection (often called a "cold")  Symptoms can last for 3-10 days with lingering cough and intermittent symptoms lasting weeks after that.  The goal of treatment at this time is to reduce your symptoms and discomfort   You can take regular formulation Mucinex, Robitussin to help with your symptoms  I have sent in a script for Tessalon pearls to help with your cough.   I also recommend adding an antihistamine to your daily regimen This includes medications like Claritin, Allegra, Zyrtec- the generics of these work very well and are usually less expensive I recommend using Flonase nasal spray - 2 puffs twice per day to help with your nasal congestion The antihistamines and Flonase can take a few weeks to provide significant relief from allergy symptoms but should start to provide some benefit soon.  You can also add a humidifier and use nasal saline rinses to help flush the nasal passages.   If your symptoms do not improve or become worse in the next 5-7 days please make an apt at the office so we can see you  Go to the ER if you begin to have more serious symptoms such as shortness of breath, trouble breathing, loss of consciousness, swelling around the eyes, high fever, severe lasting headaches, vision changes or neck pain/stiffness.

## 2022-07-22 NOTE — Progress Notes (Signed)
Acute Office Visit   Patient: [name]   DOB: January 28, 1951   72 y.o. Male  MRN: [id number] Visit Date: 07/22/2022  Today's healthcare provider: Oswaldo Conroy Oluwadamilola Rosamond, PA-C  Introduced myself to the patient as a Secondary school teacher and provided education on APPs in clinical practice.    Chief Complaint  Patient presents with   URI    Pt states he has been having a cough, headache, congestion, sore throat, sinus pressure, and body aches.    Subjective    HPI HPI     URI    Additional comments: Pt states he has been having a cough, headache, congestion, sore throat, sinus pressure, and body aches.       Last edited by [name] Ledger, CMA on 07/22/2022  3:06 PM.      URI-type symptoms  Onset: sudden  Duration: since Monday  Reports he feels better while resting  Associated symptoms: dry coughing, nasal congestion, sore throat, headaches, rhinorrhea, dizziness with position changes   Interventions: Mucinex D Tues and Wed evening, Nyquil, cough drops Cough drops seemed to improve cough  Recent sick contacts: not to his knowledge  Recent travel: yes[place]  He denies previous hx of asthma, COPD or other breathing issues.   COVID testing at home: has not tested at home  Results:  NA   Medications: Outpatient Medications Prior to Visit  Medication Sig   ACCU-CHEK SMARTVIEW test strip TEST BLOOD SUGAR TWO TIMES DAILY   acetaminophen (TYLENOL) 500 MG tablet Take 1,000 mg by mouth every 8 (eight) hours as needed.    allopurinol (ZYLOPRIM) 100 MG tablet TAKE 1 TABLET AT BEDTIME   aspirin EC 81 MG EC tablet Take 1 tablet (81 mg total) by mouth daily. Swallow whole.   carvedilol (COREG) 12.5 MG tablet TAKE 1 TABLET TWICE DAILY   clopidogrel (PLAVIX) 75 MG tablet Take 1 tablet (75 mg total) by mouth daily.   DROPLET PEN NEEDLES 32G X 4 MM MISC USE WITH [name] INSULIN INJECTION DAILY AS INSTRUCTED   fluticasone (FLONASE) 50 MCG/ACT nasal  spray USE 2 SPRAYS IN EACH NOSTRIL ONCE DAILY - USE FOR 4 TO 6 WEEKS, THEN STOP AND USE SEASONALLY OR AS NEEDED.   Insulin Glargine ([name] KWIKPEN) 100 UNIT/ML DIAL AND INJECT 34 UNITS UNDER THE SKIN ONCE DAILY AT BEDTIME. ( MAX DAILY DOSE IS 34 UNITS) (Patient taking differently: Inject 37 Units into the skin at bedtime.)   isosorbide mononitrate (IMDUR) 30 MG 24 hr tablet Take 0.5 tablets (15 mg total) by mouth daily.   lisinopril (ZESTRIL) 40 MG tablet TAKE 1 TABLET EVERY DAY   nitroGLYCERIN (NITROSTAT) 0.4 MG SL tablet Place 1 tablet (0.4 mg total) under the tongue every 5 (five) minutes as needed for chest pain.   pantoprazole (PROTONIX) 20 MG tablet TAKE 1 TABLET TWICE DAILY BEFORE MEALS (STOP OMEPRAZOLE)   REPATHA SURECLICK 140 MG/ML SOAJ Inject 140 mg into the skin every 14 (fourteen) days.   RYBELSUS 14 MG TABS Take 1 tablet (14 mg total) by mouth daily before breakfast.   No facility-administered medications prior to visit.    Review of Systems  Constitutional:  Positive for fatigue. Negative for chills and fever.  HENT:  Positive for congestion, postnasal drip, rhinorrhea and sore throat.   Respiratory:  Positive for cough. Negative for shortness of breath and wheezing.   Gastrointestinal:  Negative for diarrhea, nausea and  vomiting.  Musculoskeletal:  Positive for myalgias.  Neurological:  Positive for dizziness and headaches.       Objective    BP (!) 159/82   Pulse 87   Temp 99.2 F (37.3 C) (Oral)   SpO2 96%    Physical Exam Vitals reviewed.  Constitutional:      General: He is awake.     Appearance: Normal appearance. He is well-developed and well-groomed.  HENT:     Head: Normocephalic and atraumatic.     Right Ear: Hearing, tympanic membrane and ear canal normal.     Left Ear: Ear canal normal. Decreased hearing noted. There is impacted cerumen.     Nose: Rhinorrhea present. Rhinorrhea is purulent.     Mouth/Throat:     Lips: Pink.     Mouth: Mucous  membranes are moist.     Pharynx: Oropharynx is clear. Uvula midline. No oropharyngeal exudate or posterior oropharyngeal erythema.  Eyes:     General: Lids are normal. Gaze aligned appropriately.     Extraocular Movements: Extraocular movements intact.     Conjunctiva/sclera: Conjunctivae normal.     Pupils: Pupils are equal, round, and reactive to light.  Cardiovascular:     Rate and Rhythm: Normal rate and regular rhythm.     Heart sounds: Normal heart sounds.  Pulmonary:     Effort: Pulmonary effort is normal.     Breath sounds: Normal breath sounds. No decreased air movement. No decreased breath sounds, wheezing, rhonchi or rales.  Musculoskeletal:     Cervical back: Normal range of motion and neck supple.  Lymphadenopathy:     Head:     Right side of head: No submental, submandibular or preauricular adenopathy.     Left side of head: No submental, submandibular or preauricular adenopathy.     Cervical: No cervical adenopathy.     Right cervical: No superficial or posterior cervical adenopathy.    Left cervical: No superficial or posterior cervical adenopathy.     Upper Body:     Right upper body: No supraclavicular adenopathy.     Left upper body: No supraclavicular adenopathy.  Skin:    General: Skin is warm and dry.  Neurological:     General: No focal deficit present.     Mental Status: He is alert and oriented to person, place, and time. Mental status is at baseline.  Psychiatric:        Mood and Affect: Mood normal.        Behavior: Behavior normal. Behavior is cooperative.        Thought Content: Thought content normal.        Judgment: Judgment normal.       No results found for any visits on 07/22/22.  Assessment & Plan      No follow-ups on file.      Problem List Items Addressed This Visit   None Visit Diagnoses     Upper respiratory tract infection, unspecified type    -  Primary Visit with patient indicates symptoms comprised of nasal congestion,  dry cough, rhinorrhea, postnasal drainage, fatigue since Tuesday congruent with acute URI that is likely viral in nature at this time Patient is outside therapeutic window for antivirals- no flu or COVID testing performed today.  Due to nature and duration of symptoms recommended treatment regimen is symptomatic relief and follow up if needed Discussed with patient the various viral and bacterial etiologies of current illness and appropriate course of treatment Discussed OTC medication  options for multisymptom relief such as Dayquil/Nyquil, Theraflu, AlkaSeltzer, etc. Recommend that he uses regular formulations of Mucinex and Robitussin given Previous history of hypertension and cardiac concerns Also recommend adding antihistamine and Flonase to further assist with congestion and runny nose.  Reviewed using nasal saline rinses and humidifier at night to further assist with recovery. Tessalon 200 mg p.o. twice daily sent in to assist with cough. Discussed return precautions if symptoms are not improving or worsen over next 5-7 days.  Follow-up as needed for persistent or progressing symptoms   Relevant Medications   benzonatate (TESSALON) 200 MG capsule        No follow-ups on file.   I, Adiyah Lame E Dewana Ammirati, PA-C, have reviewed all documentation for this visit. The documentation on 07/22/22 for the exam, diagnosis, procedures, and orders are all accurate and complete.   Jacquelin Hawking, MHS, PA-C [facility]

## 2022-08-08 ENCOUNTER — Other Ambulatory Visit: Payer: Self-pay | Admitting: Pharmacist

## 2022-08-08 NOTE — Progress Notes (Signed)
   08/08/2022  Patient ID: [name], male   DOB: 1950-08-01, 72 y.o.   MRN: [id number]  Pharmacy Quality Measure Review  This patient is appearing on the insurance-provided list for the adherence measure for Statin Therapy for Patients with Cardiovascular Disease [facility]) this calendar year.   Note patient has history of drug-induced myopathy, but CPT code for this diagnosis had not yet been documented by in 2024 calendar year. Note patient has upcoming appointment with PCP on 09/01/2022.   Add appointment note for 09/01/2022 Office Visit to request provider review and document this diagnosis if still appropriate for patient.   Estelle Grumbles, PharmD, [facility] Clinical Pharmacist [facility] [phone]

## 2022-08-10 ENCOUNTER — Other Ambulatory Visit: Payer: Self-pay | Admitting: Family Medicine

## 2022-08-10 DIAGNOSIS — N390 Urinary tract infection, site not specified: Secondary | ICD-10-CM

## 2022-08-10 NOTE — Telephone Encounter (Signed)
Requested medication (s) are due for refill today:   No  Discontinued 03/15/2022  Requested medication (s) are on the active medication list:   No  Future visit scheduled:   Yes   Last ordered: 03/15/2022 Discontinued  Returned because not on med list nor is there a protocol assigned.   Requested Prescriptions  Pending Prescriptions Disp Refills   cephALEXin (KEFLEX) 250 MG capsule [Pharmacy Med Name: CEPHALEXIN 250 MG Capsule] 90 capsule 3    Sig: TAKE 1 CAPSULE EVERY DAY FOR UTI PROPHYLAXIS     Off-Protocol Failed - 08/10/2022  8:01 AM      Failed - Medication not assigned to a protocol, review manually.      Passed - Valid encounter within last 12 months    Recent Outpatient Visits           2 weeks ago Upper respiratory tract infection, unspecified type   [place] [facility] Mecum, Erin E, PA-C   1 month ago Benign hypertension with CKD (chronic kidney disease) stage III [facility])   [place] [facility] Delles, Gentry Fitz A, RPH-CPP   2 months ago Benign hypertension with CKD (chronic kidney disease) stage III [facility])   [place] [facility] Delles, Gentry Fitz A, RPH-CPP   3 months ago Hyperlipidemia associated with type 2 diabetes mellitus [facility])   [place] [facility] Delles, Gentry Fitz A, RPH-CPP   3 months ago Hyperlipidemia associated with type 2 diabetes mellitus [facility])   [place] [facility] Delles, Jackelyn Poling, RPH-CPP       Future Appointments             In 3 weeks Althea Charon, Netta Neat, DO [place] [facility]

## 2022-08-27 ENCOUNTER — Other Ambulatory Visit: Payer: Self-pay | Admitting: Family Medicine

## 2022-08-27 DIAGNOSIS — K219 Gastro-esophageal reflux disease without esophagitis: Secondary | ICD-10-CM

## 2022-08-27 DIAGNOSIS — M1000 Idiopathic gout, unspecified site: Secondary | ICD-10-CM

## 2022-08-27 DIAGNOSIS — I10 Essential (primary) hypertension: Secondary | ICD-10-CM

## 2022-08-29 NOTE — Telephone Encounter (Signed)
Requested Prescriptions  Pending Prescriptions Disp Refills   carvedilol (COREG) 12.5 MG tablet [Pharmacy Med Name: CARVEDILOL 12.5 MG Tablet] 180 tablet 0    Sig: TAKE 1 TABLET TWICE DAILY     Cardiovascular: Beta Blockers 3 Failed - 08/27/2022  5:51 AM      Failed - Cr in normal range and within 360 days    Creat  Date Value Ref Range Status  02/21/2022 1.46 (H) 0.70 - 1.28 mg/dL Final   Creatinine, Urine  Date Value Ref Range Status  08/12/2021 133 20 - 320 mg/dL Final         Failed - Last BP in normal range    BP Readings from Last 1 Encounters:  07/22/22 (!) 159/82         Passed - AST in normal range and within 360 days    AST  Date Value Ref Range Status  02/21/2022 27 10 - 35 U/L Final         Passed - ALT in normal range and within 360 days    ALT  Date Value Ref Range Status  02/21/2022 26 9 - 46 U/L Final         Passed - Last Heart Rate in normal range    Pulse Readings from Last 1 Encounters:  07/22/22 87         Passed - Valid encounter within last 6 months    Recent Outpatient Visits           1 month ago Upper respiratory tract infection, unspecified type   [place] [facility] Mecum, Erin E, PA-C   1 month ago Benign hypertension with CKD (chronic kidney disease) stage III (HCC)   [place] [facility] Delles, Gentry Fitz A, RPH-CPP   3 months ago Benign hypertension with CKD (chronic kidney disease) stage III (HCC)   [place] [facility] Delles, Gentry Fitz A, RPH-CPP   3 months ago Hyperlipidemia associated with type 2 diabetes mellitus (HCC)   [place] [facility] Delles, Gentry Fitz A, RPH-CPP   4 months ago Hyperlipidemia associated with type 2 diabetes mellitus (HCC)   [place] [facility] Delles, Jackelyn Poling, RPH-CPP       Future Appointments             In 3 days Althea Charon, Netta Neat, DO [place] [facility], [facility]              allopurinol (ZYLOPRIM) 100 MG tablet [Pharmacy Med Name: ALLOPURINOL 100 MG Tablet] 90 tablet 0    Sig: TAKE 1 TABLET AT BEDTIME     Endocrinology:  Gout Agents - allopurinol Failed - 08/27/2022  5:51 AM      Failed - Cr in normal range and within 360 days    Creat  Date Value Ref Range Status  02/21/2022 1.46 (H) 0.70 - 1.28 mg/dL Final   Creatinine, Urine  Date Value Ref Range Status  08/12/2021 133 20 - 320 mg/dL Final         Passed - Uric Acid in normal range and within 360 days    Uric Acid, Serum  Date Value Ref Range Status  02/21/2022 5.7 4.0 - 8.0 mg/dL Final    Comment:    Therapeutic target for gout patients: <6.0 mg/dL .    Uric Acid  Date Value Ref Range Status  11/12/2019 6.6 3.8 - 8.4 mg/dL Final    Comment:  Therapeutic target for gout patients: <6.0         Passed - Valid encounter within last 12 months    Recent Outpatient Visits           1 month ago Upper respiratory tract infection, unspecified type   [place] [facility] Mecum, Erin E, PA-C   1 month ago Benign hypertension with CKD (chronic kidney disease) stage III (HCC)   [place] [facility] Delles, Gentry Fitz A, RPH-CPP   3 months ago Benign hypertension with CKD (chronic kidney disease) stage III (HCC)   [place] [facility] Delles, Gentry Fitz A, RPH-CPP   3 months ago Hyperlipidemia associated with type 2 diabetes mellitus (HCC)   [place] [facility] Delles, Gentry Fitz A, RPH-CPP   4 months ago Hyperlipidemia associated with type 2 diabetes mellitus (HCC)   [place] [facility] Delles, Jackelyn Poling, RPH-CPP       Future Appointments             In 3 days Althea Charon, Netta Neat, DO [place] [facility], [facility]            Passed - CBC within normal limits and completed in the last 12 months    WBC  Date Value Ref Range Status  02/21/2022 6.2 3.8 - 10.8  Thousand/uL Final   RBC  Date Value Ref Range Status  02/21/2022 4.38 4.20 - 5.80 Million/uL Final   Hemoglobin  Date Value Ref Range Status  02/21/2022 13.0 (L) 13.2 - 17.1 g/dL Final  84/13/2440 10.2 13.0 - 17.7 g/dL Final   HCT  Date Value Ref Range Status  02/21/2022 39.3 38.5 - 50.0 % Final   Hematocrit  Date Value Ref Range Status  11/12/2019 42.4 37.5 - 51.0 % Final   MCHC  Date Value Ref Range Status  02/21/2022 33.1 32.0 - 36.0 g/dL Final   [facility]  Date Value Ref Range Status  02/21/2022 29.7 27.0 - 33.0 pg Final   MCV  Date Value Ref Range Status  02/21/2022 89.7 80.0 - 100.0 fL Final  11/12/2019 91 79 - 97 fL Final  10/21/2011 90 80 - 100 fL Final   No results found for: "PLTCOUNTKUC", "LABPLAT", "POCPLA" RDW  Date Value Ref Range Status  02/21/2022 14.1 11.0 - 15.0 % Final  11/12/2019 13.8 11.6 - 15.4 % Final  10/21/2011 13.9 11.5 - 14.5 % Final          lisinopril (ZESTRIL) 40 MG tablet [Pharmacy Med Name: LISINOPRIL 40 MG Tablet] 90 tablet 0    Sig: TAKE 1 TABLET EVERY DAY     Cardiovascular:  ACE Inhibitors Failed - 08/27/2022  5:51 AM      Failed - Cr in normal range and within 180 days    Creat  Date Value Ref Range Status  02/21/2022 1.46 (H) 0.70 - 1.28 mg/dL Final   Creatinine, Urine  Date Value Ref Range Status  08/12/2021 133 20 - 320 mg/dL Final         Failed - K in normal range and within 180 days    Potassium  Date Value Ref Range Status  02/21/2022 4.6 3.5 - 5.3 mmol/L Final  10/21/2011 5.0 3.5 - 5.1 mmol/L Final         Failed - Last BP in normal range    BP Readings from Last 1 Encounters:  07/22/22 (!) 159/82  Passed - Patient is not pregnant      Passed - Valid encounter within last 6 months    Recent Outpatient Visits           1 month ago Upper respiratory tract infection, unspecified type   [place] [facility] Mecum, Erin E, PA-C   1 month ago Benign hypertension with CKD (chronic kidney  disease) stage III [facility])   [place] [facility] Delles, Gentry Fitz A, RPH-CPP   3 months ago Benign hypertension with CKD (chronic kidney disease) stage III [facility])   [place] [facility] Delles, Gentry Fitz A, RPH-CPP   3 months ago Hyperlipidemia associated with type 2 diabetes mellitus [facility])   [place] [facility] Delles, Gentry Fitz A, RPH-CPP   4 months ago Hyperlipidemia associated with type 2 diabetes mellitus [facility])   [place] [facility] Delles, Jackelyn Poling, RPH-CPP       Future Appointments             In 3 days Althea Charon, Netta Neat, DO [place] [facility], [facility]             pantoprazole (PROTONIX) 20 MG tablet [Pharmacy Med Name: PANTOPRAZOLE SODIUM 20 MG Tablet Delayed Release] 180 tablet 0    Sig: TAKE 1 TABLET TWICE DAILY BEFORE MEALS (STOP OMEPRAZOLE)     Gastroenterology: Proton Pump Inhibitors Passed - 08/27/2022  5:51 AM      Passed - Valid encounter within last 12 months    Recent Outpatient Visits           1 month ago Upper respiratory tract infection, unspecified type   [place] [facility] Mecum, Erin E, PA-C   1 month ago Benign hypertension with CKD (chronic kidney disease) stage III [facility])   [place] [facility] Delles, Gentry Fitz A, RPH-CPP   3 months ago Benign hypertension with CKD (chronic kidney disease) stage III [facility])   [place] [facility] Delles, Gentry Fitz A, RPH-CPP   3 months ago Hyperlipidemia associated with type 2 diabetes mellitus [facility])   [place] [facility] Delles, Gentry Fitz A, RPH-CPP   4 months ago Hyperlipidemia associated with type 2 diabetes mellitus [facility])   [place] [facility] Delles, Jackelyn Poling, RPH-CPP       Future Appointments             In 3 days Althea Charon, Netta Neat, DO [place] [facility], [facility]

## 2022-08-31 DIAGNOSIS — L821 Other seborrheic keratosis: Secondary | ICD-10-CM | POA: Diagnosis not present

## 2022-08-31 DIAGNOSIS — L820 Inflamed seborrheic keratosis: Secondary | ICD-10-CM | POA: Diagnosis not present

## 2022-08-31 DIAGNOSIS — D692 Other nonthrombocytopenic purpura: Secondary | ICD-10-CM | POA: Diagnosis not present

## 2022-08-31 DIAGNOSIS — L578 Other skin changes due to chronic exposure to nonionizing radiation: Secondary | ICD-10-CM | POA: Diagnosis not present

## 2022-08-31 DIAGNOSIS — D2261 Melanocytic nevi of right upper limb, including shoulder: Secondary | ICD-10-CM | POA: Diagnosis not present

## 2022-08-31 DIAGNOSIS — D2271 Melanocytic nevi of right lower limb, including hip: Secondary | ICD-10-CM | POA: Diagnosis not present

## 2022-08-31 DIAGNOSIS — D225 Melanocytic nevi of trunk: Secondary | ICD-10-CM | POA: Diagnosis not present

## 2022-08-31 DIAGNOSIS — D2262 Melanocytic nevi of left upper limb, including shoulder: Secondary | ICD-10-CM | POA: Diagnosis not present

## 2022-08-31 DIAGNOSIS — D2272 Melanocytic nevi of left lower limb, including hip: Secondary | ICD-10-CM | POA: Diagnosis not present

## 2022-09-01 ENCOUNTER — Encounter: Payer: Self-pay | Admitting: Family Medicine

## 2022-09-01 ENCOUNTER — Other Ambulatory Visit: Payer: Self-pay | Admitting: Family Medicine

## 2022-09-01 ENCOUNTER — Ambulatory Visit (INDEPENDENT_AMBULATORY_CARE_PROVIDER_SITE_OTHER): Payer: Medicare HMO | Admitting: Family Medicine

## 2022-09-01 VITALS — BP 118/72 | HR 80 | Temp 99.1°F | Ht 71.0 in | Wt 223.0 lb

## 2022-09-01 DIAGNOSIS — N390 Urinary tract infection, site not specified: Secondary | ICD-10-CM

## 2022-09-01 DIAGNOSIS — I2510 Atherosclerotic heart disease of native coronary artery without angina pectoris: Secondary | ICD-10-CM | POA: Diagnosis not present

## 2022-09-01 DIAGNOSIS — I129 Hypertensive chronic kidney disease with stage 1 through stage 4 chronic kidney disease, or unspecified chronic kidney disease: Secondary | ICD-10-CM

## 2022-09-01 DIAGNOSIS — Z0000 Encounter for general adult medical examination without abnormal findings: Secondary | ICD-10-CM

## 2022-09-01 DIAGNOSIS — E1169 Type 2 diabetes mellitus with other specified complication: Secondary | ICD-10-CM

## 2022-09-01 DIAGNOSIS — M1000 Idiopathic gout, unspecified site: Secondary | ICD-10-CM

## 2022-09-01 DIAGNOSIS — R972 Elevated prostate specific antigen [PSA]: Secondary | ICD-10-CM

## 2022-09-01 DIAGNOSIS — I25118 Atherosclerotic heart disease of native coronary artery with other forms of angina pectoris: Secondary | ICD-10-CM

## 2022-09-01 DIAGNOSIS — R300 Dysuria: Secondary | ICD-10-CM | POA: Diagnosis not present

## 2022-09-01 DIAGNOSIS — Z794 Long term (current) use of insulin: Secondary | ICD-10-CM

## 2022-09-01 DIAGNOSIS — Z23 Encounter for immunization: Secondary | ICD-10-CM

## 2022-09-01 DIAGNOSIS — N183 Chronic kidney disease, stage 3 unspecified: Secondary | ICD-10-CM

## 2022-09-01 DIAGNOSIS — G720 Drug-induced myopathy: Secondary | ICD-10-CM

## 2022-09-01 DIAGNOSIS — Z9861 Coronary angioplasty status: Secondary | ICD-10-CM

## 2022-09-01 LAB — POCT URINALYSIS DIPSTICK
Bilirubin, UA: NEGATIVE
Blood, UA: NEGATIVE
Glucose, UA: NEGATIVE
Ketones, UA: NEGATIVE
Protein, UA: POSITIVE — AB
Spec Grav, UA: 1.02 (ref 1.010–1.025)
Urobilinogen, UA: 0.2 E.U./dL
pH, UA: 5 (ref 5.0–8.0)

## 2022-09-01 LAB — POCT GLYCOSYLATED HEMOGLOBIN (HGB A1C): Hemoglobin A1C: 7.1 % — AB (ref 4.0–5.6)

## 2022-09-01 MED ORDER — SHINGRIX 50 MCG/0.5ML IM SUSR
INTRAMUSCULAR | 1 refills | Status: DC
Start: 2022-09-01 — End: 2022-12-05

## 2022-09-01 MED ORDER — NITROGLYCERIN 0.4 MG SL SUBL
0.4000 mg | SUBLINGUAL_TABLET | SUBLINGUAL | 0 refills | Status: AC | PRN
Start: 2022-09-01 — End: ?

## 2022-09-01 MED ORDER — CEPHALEXIN 500 MG PO CAPS: 500.0000 mg | ORAL_CAPSULE | Freq: Three times a day (TID) | ORAL | 0 refills | Status: DC

## 2022-09-01 MED ORDER — CEPHALEXIN 250 MG PO CAPS
250.0000 mg | ORAL_CAPSULE | Freq: Every day | ORAL | 3 refills | Status: DC
Start: 2022-09-01 — End: 2023-03-10

## 2022-09-01 NOTE — Progress Notes (Signed)
Subjective:    Patient ID: [name], male    DOB: March 24, 1951, 72 y.o.   MRN: [id number]  [name] is a 72 y.o. male presenting on 09/01/2022 for Diabetes (6 mth f/u ) and Dysuria (Complains of dysuria x 2 weeks )   HPI  CHRONIC DM, Type 2 with nephropathy / CKD-III A1c down to 7.1% improved His CBG readings avg 120-140 Meds: - On Rybelsus 14 mg - On [name] 37 units nightly  - Remains off Metformin Reports  good compliance. Tolerating well w/o side-effects Currently on ACEi Lifestyle: - Diet (improved DM Diet, except now still increased sweets) - Exercise limited Due for DM Eye exam. Denies hypoglycemia, polyuria, visual changes, numbness or tingling.   HYPERLIPIDEMIA Drug Induced Myopathy Off Statin therapy failed due to myopathy On Repatha PCSK9   CAD History of MI Needs nitroglycerin re ordered   Hypertension Controlled on current medication.   Elevated PSA Recurrent UTI Followed by Urology in [place] Prior PSA >20, has improved to 7 now. Previously had Prostate Biopsy 2021 negative but caused sepsis infection On D-Mannose Cranberry, probiotic to reduce UTI  UTI Urinalysis positive today and concern for UTI He has dysuria frequency similar to prior He is off Prophylaxis  Keflex 250 daily, was not sure how long to keep taking.       09/01/2022   10:26 AM 03/04/2022   10:23 AM 03/02/2022   10:34 AM  Depression screen PHQ 2/9  Decreased Interest 0 0 0  Down, Depressed, Hopeless 0 0 0  PHQ - 2 Score 0 0 0  Altered sleeping 0 0 0  Tired, decreased energy 0 0 0  Change in appetite 0 0 0  Feeling bad or failure about yourself  0 0 0  Trouble concentrating 0 0 0  Moving slowly or fidgety/restless 0 0 0  Suicidal thoughts 0 0 0  PHQ-9 Score 0 0 0  Difficult doing work/chores Not difficult at all Not difficult at all Not difficult at all    Social History   Tobacco Use   Smoking status: Former    Types: Cigars    Quit date: 2000    Years  since quitting: 24.4   Smokeless tobacco: Current    Types: Chew   Tobacco comments:    wife states patient never smoked cigarettes, just chewing tobacco  Vaping Use   Vaping Use: Never used  Substance Use Topics   Alcohol use: Not Currently    Alcohol/week: 0.0 standard drinks of alcohol   Drug use: No    Review of Systems Per HPI unless specifically indicated above     Objective:    BP 118/72 (BP Location: Right Arm, Patient Position: Sitting, Cuff Size: Normal)   Pulse 80   Temp 99.1 F (37.3 C) (Oral)   Ht 5\' 11"  (1.803 m)   Wt 223 lb (101.2 kg)   SpO2 97%   BMI 31.10 kg/m   Wt Readings from Last 3 Encounters:  09/01/22 223 lb (101.2 kg)  03/04/22 232 lb 6.4 oz (105.4 kg)  03/02/22 234 lb (106.1 kg)    Physical Exam Vitals and nursing note reviewed.  Constitutional:      General: He is not in acute distress.    Appearance: He is well-developed. He is not diaphoretic.     Comments: Well-appearing, comfortable, cooperative  HENT:     Head: Normocephalic and atraumatic.  Eyes:     General:  Right eye: No discharge.        Left eye: No discharge.     Conjunctiva/sclera: Conjunctivae normal.  Neck:     Thyroid: No thyromegaly.  Cardiovascular:     Rate and Rhythm: Normal rate and regular rhythm.     Pulses: Normal pulses.     Heart sounds: Normal heart sounds. No murmur heard. Pulmonary:     Effort: Pulmonary effort is normal. No respiratory distress.     Breath sounds: Normal breath sounds. No wheezing or rales.  Musculoskeletal:        General: Normal range of motion.     Cervical back: Normal range of motion and neck supple.  Lymphadenopathy:     Cervical: No cervical adenopathy.  Skin:    General: Skin is warm and dry.     Findings: No erythema or rash.  Neurological:     Mental Status: He is alert and oriented to person, place, and time. Mental status is at baseline.  Psychiatric:        Behavior: Behavior normal.     Comments: Well  groomed, good eye contact, normal speech and thoughts      Results for orders placed or performed in visit on 09/01/22  POCT glycosylated hemoglobin (Hb A1C)  Result Value Ref Range   Hemoglobin A1C 7.1 (A) 4.0 - 5.6 %   HbA1c POC (<> result, manual entry)     HbA1c, POC (prediabetic range)     HbA1c, POC (controlled diabetic range)    POCT Urinalysis Dipstick  Result Value Ref Range   Color, UA dark yellow    Clarity, UA cloudy    Glucose, UA Negative Negative   Bilirubin, UA negative    Ketones, UA negative    Spec Grav, UA 1.020 1.010 - 1.025   Blood, UA negative    pH, UA 5.0 5.0 - 8.0   Protein, UA Positive (A) Negative   Urobilinogen, UA 0.2 0.2 or 1.0 E.U./dL   Nitrite, UA positve    Leukocytes, UA Large (3+) (A) Negative   Appearance cloudy    Odor        Assessment & Plan:   Problem List Items Addressed This Visit     CAD S/P percutaneous coronary angioplasty   Relevant Medications   nitroGLYCERIN (NITROSTAT) 0.4 MG SL tablet   Drug-induced myopathy    Secondary to side effect on statin (failed rosuvastatin, crestor, lipitor in past)  Off Statin On Repatha PCSK9      Type 2 diabetes mellitus with other specified complication (HCC) - Primary    Improved A1c to 7.1 Improving lifestyle On Rybelsus now oral GLP1 No hypoglycemia Complications - CKD-III nephropathy, other neuropathy from spine but less likely DM neuropathy, other including hyperlipidemia, GERD, obesity - increases risk of future cardiovascular complications  Off Tradjenta, duplicate therapy/cost, Invokana Failed Trulicity 0.75 and 1.5mg  weekly d/t GI intolerance OFF Metformin due to AKI - and GI intolerance  Plan:  1. Contiue Rybelsus 14mg  daily before 1st meal 2. [name] 37 units with plan to adjust accordingly if improved sugars Continue working w/ CCM pharmacy interval between [employer] improved lifestyle - low carb, low sugar diet, reduce portion size, continue improving regular  exercise Check CBG, bring log to next visit for review Continue ACEi      Relevant Orders   POCT glycosylated hemoglobin (Hb A1C) (Completed)   Other Visit Diagnoses     Recurrent UTI       Relevant Medications  cephALEXin (KEFLEX) 500 MG capsule   cephALEXin (KEFLEX) 250 MG capsule   SHINGRIX injection   Other Relevant Orders   POCT Urinalysis Dipstick (Completed)   Urine Culture   Dysuria       Relevant Orders   POCT Urinalysis Dipstick (Completed)   Need for shingles vaccine       Relevant Medications   SHINGRIX injection   Coronary artery disease involving native coronary artery of native heart with other form of angina pectoris (HCC)   (Chronic)     Relevant Medications   nitroGLYCERIN (NITROSTAT) 0.4 MG SL tablet   Benign hypertension with CKD (chronic kidney disease) stage III (HCC)   (Chronic)     Relevant Medications   nitroGLYCERIN (NITROSTAT) 0.4 MG SL tablet       UTI Urinalysis showed nitrite and based on symptoms, will cover for UTI He is off prophylaxis Keflex 250mg  daily Will re order Keflex 500 THREE TIMES A DAY x 7 days and then order 250 daily maintenance prophylaxis again 6-12+ months for now  CAD Re order his Nitro, last rx expired  Orders Placed This Encounter  Procedures   Urine Culture   POCT glycosylated hemoglobin (Hb A1C)   POCT Urinalysis Dipstick     Meds ordered this encounter  Medications   cephALEXin (KEFLEX) 500 MG capsule    Sig: Take 1 capsule (500 mg total) by mouth 3 (three) times daily. For 7 days    Dispense:  21 capsule    Refill:  0   nitroGLYCERIN (NITROSTAT) 0.4 MG SL tablet    Sig: Place 1 tablet (0.4 mg total) under the tongue every 5 (five) minutes as needed for chest pain.    Dispense:  30 tablet    Refill:  0   cephALEXin (KEFLEX) 250 MG capsule    Sig: Take 1 capsule (250 mg total) by mouth daily.    Dispense:  90 capsule    Refill:  3   SHINGRIX injection    Sig: Inject 0.5 mL into muscle for shingles  vaccine. Repeat dose in 2-6 months.    Dispense:  0.5 mL    Refill:  1      Follow up plan: Return in about 6 months (around 03/03/2023) for 6 month fasting lab only then 1 week later Annual Physical.  Future labs ordered for 03/03/23  add urine microalbumin   Saralyn Pilar, DO [facility] 09/01/2022, 10:46 AM

## 2022-09-01 NOTE — Assessment & Plan Note (Signed)
Secondary to side effect on statin (failed rosuvastatin, crestor, lipitor in past)  Off Statin On Repatha PCSK9

## 2022-09-01 NOTE — Patient Instructions (Addendum)
Thank you for coming to the office today.  Recent Labs    02/21/22 0825 09/01/22 1048  HGBA1C 7.8* 7.1*   Keep up current course on blood sugar.  -----  Shingrix vaccine printed.  Nitroglycerin re ordered.   Urinary tract infection STart Keflex 3 times a day 500mg , at [facility]  for 7 days  Ordered the Keflex 250mg  daily prevention to the mail order   DUE for FASTING BLOOD WORK (no food or drink after midnight before the lab appointment, only water or coffee without cream/sugar on the morning of)  SCHEDULE "Lab Only" visit in the morning at the clinic for lab draw in 6 MONTHS   - Make sure Lab Only appointment is at about 1 week before your next appointment, so that results will be available  For Lab Results, once available within 2-3 days of blood draw, you can can log in to MyChart online to view your results and a brief explanation. Also, we can discuss results at next follow-up visit.    Please schedule a Follow-up Appointment to: Return in about 6 months (around 03/03/2023) for 6 month fasting lab only then 1 week later Annual Physical.  If you have any other questions or concerns, please feel free to call the office or send a message through MyChart. You may also schedule an earlier appointment if necessary.  Additionally, you may be receiving a survey about your experience at our office within a few days to 1 week by e-mail or mail. We value your feedback.  Saralyn Pilar, DO [facility], [place]

## 2022-09-01 NOTE — Addendum Note (Signed)
Addended by: [name] on: 09/01/2022 05:08 PM   Modules accepted: Orders

## 2022-09-01 NOTE — Assessment & Plan Note (Signed)
Improved A1c to 7.1 Improving lifestyle On Rybelsus now oral GLP1 No hypoglycemia Complications - CKD-III nephropathy, other neuropathy from spine but less likely DM neuropathy, other including hyperlipidemia, GERD, obesity - increases risk of future cardiovascular complications  Off Tradjenta, duplicate therapy/cost, Invokana Failed Trulicity 0.75 and 1.5mg  weekly d/t GI intolerance OFF Metformin due to AKI - and GI intolerance  Plan:  1. Contiue Rybelsus 14mg  daily before 1st meal 2. [name] 37 units with plan to adjust accordingly if improved sugars Continue working w/ CCM pharmacy interval between [employer] improved lifestyle - low carb, low sugar diet, reduce portion size, continue improving regular exercise Check CBG, bring log to next visit for review Continue ACEi

## 2022-09-03 LAB — URINE CULTURE
MICRO NUMBER:: 15051389
SPECIMEN QUALITY:: ADEQUATE

## 2022-09-08 ENCOUNTER — Other Ambulatory Visit: Payer: Self-pay | Admitting: Family Medicine

## 2022-09-08 DIAGNOSIS — E1122 Type 2 diabetes mellitus with diabetic chronic kidney disease: Secondary | ICD-10-CM | POA: Diagnosis not present

## 2022-09-08 DIAGNOSIS — N390 Urinary tract infection, site not specified: Secondary | ICD-10-CM

## 2022-09-08 DIAGNOSIS — R300 Dysuria: Secondary | ICD-10-CM | POA: Diagnosis not present

## 2022-09-08 DIAGNOSIS — I10 Essential (primary) hypertension: Secondary | ICD-10-CM | POA: Diagnosis not present

## 2022-09-08 DIAGNOSIS — N1831 Chronic kidney disease, stage 3a: Secondary | ICD-10-CM | POA: Diagnosis not present

## 2022-09-08 MED ORDER — AMOXICILLIN-POT CLAVULANATE 875-125 MG PO TABS
1.0000 | ORAL_TABLET | Freq: Two times a day (BID) | ORAL | 0 refills | Status: DC
Start: 2022-09-08 — End: 2022-09-26

## 2022-09-14 DIAGNOSIS — N1831 Chronic kidney disease, stage 3a: Secondary | ICD-10-CM | POA: Diagnosis not present

## 2022-09-14 DIAGNOSIS — D631 Anemia in chronic kidney disease: Secondary | ICD-10-CM | POA: Diagnosis not present

## 2022-09-14 DIAGNOSIS — E1122 Type 2 diabetes mellitus with diabetic chronic kidney disease: Secondary | ICD-10-CM | POA: Diagnosis not present

## 2022-09-14 DIAGNOSIS — I10 Essential (primary) hypertension: Secondary | ICD-10-CM | POA: Diagnosis not present

## 2022-09-22 ENCOUNTER — Encounter: Payer: Self-pay | Admitting: Pharmacist

## 2022-09-26 ENCOUNTER — Ambulatory Visit: Payer: Medicare HMO | Admitting: Pharmacist

## 2022-09-26 DIAGNOSIS — E1169 Type 2 diabetes mellitus with other specified complication: Secondary | ICD-10-CM

## 2022-09-26 DIAGNOSIS — I129 Hypertensive chronic kidney disease with stage 1 through stage 4 chronic kidney disease, or unspecified chronic kidney disease: Secondary | ICD-10-CM

## 2022-09-26 NOTE — Progress Notes (Signed)
09/26/2022 Name: [name] MRN: [id number] DOB: 08-12-1950  Chief Complaint  Patient presents with   Medication Management   Medication Assistance    [name] is a 72 y.o. year old male who presented for a telephone visit.   They were referred to the pharmacist by their PCP for assistance in managing diabetes, hypertension, hyperlipidemia, and medication access.      Subjective:   Care Team: Primary Care Provider: [name], DO ; Next Scheduled Visit: 03/10/2023 Cardiologist: Daniel Odea, MD Nephrologist: Daniel Pigeon, MD  ; Next Scheduled Visit: 03/16/2023 Urology: [facility]  Medication Access/Adherence  Current Pharmacy:  [facility] [address], [place] - [address] [place] [address] Phone: [phone] Fax: [phone]  [facility] [place] [facility] - [place], [place] - [address] [place] [address] Phone: [phone] Fax: [phone]  [facility] - [place], [place] - [address] [place] [address] Phone: [phone] Fax: [phone]   Patient reports affordability concerns with their medications: No  Patient reports access/transportation concerns to their pharmacy: No  Patient reports adherence concerns with their medications:  No     Patient using weekly pillbox    Diabetes:   Current medications:  Rybelsus 14 mg daily Taking ?30 minutes before the first food, beverage, or other oral medications of the day with ?4 oz of plain water  Insulin glargine ([name]/Lantus) 37 units QHS   Medications tried in the past:  Trulicity (GI intolerance) Metformin (unable to tolerate - GI side effects) Bydureon (difficulty with device/administration) Invokana (felt dehydrated)   Current glucose readings:  - This morning fasting: 82 - Reports had not been checking over past week while  was traveling, but reports previously morning fasting readings ranging 87-118  Patient denies hypoglycemic s/sx including dizziness, shakiness, sweating             Again encourage patient to obtain and carry glucose tablets   Current physical activity: stays active throughout the day working and walking some at home   Next eye exam scheduled for October 2024   Current medication access support:  - Enrolled in patient assistance for for [name] from [employer] and Rybelsus from [employer] patient assistance programs through 03/28/2023     Hypertension:   Current medications:  Carvedilol 12.5 mg twice daily Lisinopril 40 mg daily  Imdur 30 mg -1/2 tablet (15 mg) daily   Patient has an automated, upper arm home BP cuff Reports checked BP this morning, but was prior to his BP medicine and while chewing tobacco, reading: 145/80, HR 54 Note checked at office visit with Nephrologist on 6/19, reading: 119/71, HR 76   Reports working on cutting back on salt intake   Denies symptoms of hypotension   Current physical activity: stays active throughout the day working and walking some at home     Hyperlipidemia/hx of CAD/NSTEMI:   Current lipid lowering medications: Repatha 140 mg every 14 days  Reports tolerating well             Note prior authorization for Repatha approved through 03/28/2023   Previous therapies tried: atorvastatin 40 mg daily & 10 mg daily (unable to tolerate due to myalgia), ezetimibe    Have discussed importance of limiting saturated fats and trans fats in diet   Antiplatelet regimen: Aspirin 81 mg daily Clopidogrel 75 mg daily   Current physical activity:  stays active throughout the day working and walking some at home     Objective:  Lab Results  Component Value Date   HGBA1C 7.1 (A) 09/01/2022    Lab Results  Component Value Date   CREATININE 1.46 (H) 02/21/2022   BUN 20 02/21/2022   NA 137 02/21/2022   K 4.6 02/21/2022   CL 103 02/21/2022    CO2 28 02/21/2022    Lab Results  Component Value Date   CHOL 149 02/21/2022   HDL 37 (L) 02/21/2022   LDLCALC 86 02/21/2022   TRIG 160 (H) 02/21/2022   CHOLHDL 4.0 02/21/2022   BP Readings from Last 3 Encounters:  09/01/22 118/72  07/22/22 (!) 159/82  03/04/22 134/70   Pulse Readings from Last 3 Encounters:  09/01/22 80  07/22/22 87  03/02/22 85     Medications Reviewed Today     Reviewed by [name], RPH-CPP (Pharmacist) on 09/26/22 at 0900  Med List Status: <None>   Medication Order Taking? Sig Documenting Provider Last Dose Status Informant  ACCU-CHEK SMARTVIEW test strip [id number]  TEST BLOOD SUGAR TWO TIMES DAILY Daniel Cords, DO  Active   acetaminophen (TYLENOL) 500 MG tablet [id number]  Take 1,000 mg by mouth every 8 (eight) hours as needed.  [provider]  Active Spouse/Significant Other, Pharmacy Records  allopurinol (ZYLOPRIM) 100 MG tablet [id number]  TAKE 1 TABLET AT BEDTIME Daniel Cords, DO  Active   aspirin EC 81 MG EC tablet [id number] Yes Take 1 tablet (81 mg total) by mouth daily. Swallow whole. Alford Highland, MD Taking Active Spouse/Significant Other, Pharmacy Records  carvedilol (COREG) 12.5 MG tablet [id number] Yes TAKE 1 TABLET TWICE DAILY Karamalegos, Netta Neat, DO Taking Active   cephALEXin (KEFLEX) 250 MG capsule [id number] Yes Take 1 capsule (250 mg total) by mouth daily. Daniel Cords, DO Taking Active   clopidogrel (PLAVIX) 75 MG tablet [id number] Yes Take 1 tablet (75 mg total) by mouth daily. Daniel Odea, MD Taking Active   DROPLET PEN NEEDLES 32G X 4 MM MISC [id number]  USE WITH [name] INSULIN INJECTION DAILY AS INSTRUCTED Daniel Cords, DO  Active   fluticasone (FLONASE) 50 MCG/ACT nasal spray [id number]  USE 2 SPRAYS IN EACH NOSTRIL ONCE DAILY - USE FOR 4 TO 6 WEEKS, THEN STOP AND USE SEASONALLY OR AS NEEDED. Daniel Cords, DO  Active   Insulin Glargine ([name]  KWIKPEN) 100 UNIT/ML [id number] Yes DIAL AND INJECT 34 UNITS UNDER THE SKIN ONCE DAILY AT BEDTIME. ( MAX DAILY DOSE IS 34 UNITS)  Patient taking differently: Inject 37 Units into the skin at bedtime.   Daniel Cords, DO Taking Active   isosorbide mononitrate (IMDUR) 30 MG 24 hr tablet [id number] Yes Take 0.5 tablets (15 mg total) by mouth daily. Daniel Odea, MD Taking Active   lisinopril (ZESTRIL) 40 MG tablet [id number] Yes TAKE 1 TABLET EVERY DAY Karamalegos, Netta Neat, DO Taking Active   nitroGLYCERIN (NITROSTAT) 0.4 MG SL tablet [id number]  Place 1 tablet (0.4 mg total) under the tongue every 5 (five) minutes as needed for chest pain. Daniel Cords, DO  Active   pantoprazole (PROTONIX) 20 MG tablet [id number]  TAKE 1 TABLET TWICE DAILY BEFORE MEALS (STOP OMEPRAZOLE) Daniel Cords, DO  Active   REPATHA SURECLICK 140 MG/ML SOAJ [id number] Yes Inject 140 mg into the skin every 14 (fourteen) days. Daniel Cords, DO Taking Active   RYBELSUS 14 MG TABS [id number] Yes Take 1 tablet (14  mg total) by mouth daily before breakfast. Daniel Cords, DO Taking Active Spouse/Significant Other, Pharmacy Records           Med Note Ronney Asters, [facility] A   Wed Oct 13, 2021  8:56 AM)    [facility] injection [id number]  Inject 0.5 mL into muscle for shingles vaccine. Repeat dose in 2-6 months. Daniel Cords, DO  Active               Assessment/Plan:   Encourage patient to pick up and start taking iron supplement as recommended by Nephrologist on 6/19  Encourage patient to schedule follow up Cardiology appointment  Encourage patient to consider following up with pharmacy to obtain Shingrix vaccines  Encourage patient to consider tobacco cessation/cutting back on use  Diabetes: - Have reviewed goal A1c, goal fasting, and goal 2 hour post prandial glucose - Have reviewed dietary modifications including: - have well-balanced meals throughout  the day while controlling carbohydrate portion sizes and limiting nighttime snacking - Again encourage patient to obtain glucose tablets to carry with him in case of hypoglycemia - Recommend to monitor home blood sugar, keep log of results and have this record to review during appointments, but to contact office or CM Pharmacist sooner for readings outside of established parameters or for symptoms Have encouraged patient to use glucometer to check blood sugar 2 hours after meals (breakfast, lunch or supper) a couple of times/week to use as feedback on impact of dietary choices on blood sugar     Hypertension: - Reviewed long term cardiovascular and renal outcomes of uncontrolled blood pressure - Reviewed appropriate blood pressure monitoring technique and reviewed goal blood pressure.  - Encourage dietary changes, including limiting salt/sodium intake and increasing intake of fruit and vegetables - Recommended to check home blood pressure and heart rate and keep a log of the results and to contact office for readings outside of established parameters   Hyperlipidemia: - Encourage patient to continue taking Repatha as directed as now tolerating well     Follow Up Plan: Clinical Pharmacist will follow up with patient by telephone again on 12/02/2022 at 8:30 am    Estelle Grumbles, PharmD, Patsy Baltimore, CPP Clinical Pharmacist [facility] [phone]

## 2022-09-26 NOTE — Patient Instructions (Signed)
Goals Addressed             This Visit's Progress    Pharmacy - Patient Goals       Our goal A1c is less than 7%. This corresponds with fasting sugars less than 130 and 2 hour after meal sugars less than 180. Please check your fasting blood sugar each morning and keep a record of the results  Check your blood pressure once daily, and any time you have concerning symptoms like headache, chest pain, dizziness, shortness of breath, or vision changes.   Our goal is less than 130/80.  To appropriately check your blood pressure, make sure you do the following:  1) Avoid caffeine, exercise, or tobacco products for 30 minutes before checking. Empty your bladder. 2) Sit with your back supported in a flat-backed chair. Rest your arm on something flat (arm of the chair, table, etc). 3) Sit still with your feet flat on the floor, resting, for at least 5 minutes.  4) Check your blood pressure. Take 1-2 readings.  5) Write down these readings and bring with you to any provider appointments.  Bring your home blood pressure machine with you to a provider's office for accuracy comparison at least once a year.   Make sure you take your blood pressure medications before you come to any office visit, even if you were asked to fast for labs.   Feel free to call me with any questions or concerns. I look forward to our next call  Tashawn Laswell Audyn Dimercurio, PharmD, BCACP Clinical Pharmacist [facility] [phone]          

## 2022-09-30 ENCOUNTER — Telehealth: Payer: Medicare HMO

## 2022-10-11 DIAGNOSIS — M47816 Spondylosis without myelopathy or radiculopathy, lumbar region: Secondary | ICD-10-CM | POA: Diagnosis not present

## 2022-10-14 ENCOUNTER — Telehealth: Payer: Self-pay

## 2022-10-14 NOTE — Telephone Encounter (Signed)
Samples rybelsus receive from [employer], left message for patient to pick up.

## 2022-10-27 DIAGNOSIS — M47817 Spondylosis without myelopathy or radiculopathy, lumbosacral region: Secondary | ICD-10-CM | POA: Diagnosis not present

## 2022-11-01 DIAGNOSIS — M47896 Other spondylosis, lumbar region: Secondary | ICD-10-CM | POA: Diagnosis not present

## 2022-11-01 DIAGNOSIS — M961 Postlaminectomy syndrome, not elsewhere classified: Secondary | ICD-10-CM | POA: Diagnosis not present

## 2022-11-11 ENCOUNTER — Other Ambulatory Visit: Payer: Self-pay | Admitting: Family Medicine

## 2022-11-11 DIAGNOSIS — K219 Gastro-esophageal reflux disease without esophagitis: Secondary | ICD-10-CM

## 2022-11-11 DIAGNOSIS — M1000 Idiopathic gout, unspecified site: Secondary | ICD-10-CM

## 2022-11-11 DIAGNOSIS — I10 Essential (primary) hypertension: Secondary | ICD-10-CM

## 2022-11-23 DIAGNOSIS — M47816 Spondylosis without myelopathy or radiculopathy, lumbar region: Secondary | ICD-10-CM | POA: Diagnosis not present

## 2022-11-23 DIAGNOSIS — E119 Type 2 diabetes mellitus without complications: Secondary | ICD-10-CM | POA: Diagnosis not present

## 2022-12-01 ENCOUNTER — Ambulatory Visit: Payer: Medicare HMO | Admitting: Cardiology

## 2022-12-02 ENCOUNTER — Ambulatory Visit: Payer: Medicare HMO | Admitting: Pharmacist

## 2022-12-02 DIAGNOSIS — E1169 Type 2 diabetes mellitus with other specified complication: Secondary | ICD-10-CM

## 2022-12-02 DIAGNOSIS — N183 Chronic kidney disease, stage 3 unspecified: Secondary | ICD-10-CM

## 2022-12-02 NOTE — Progress Notes (Signed)
12/02/2022 Name: [name] MRN: [id number] DOB: [birth year]  Chief Complaint  Patient presents with   Medication Management    [name] is a 72 y.o. year old male who presented for a telephone visit.   They were referred to the pharmacist by their PCP for assistance in managing diabetes, hypertension, hyperlipidemia, and medication access.      Subjective:   Care Team: Primary Care Provider: [name], DO ; Next Scheduled Visit: 03/10/2023 Cardiologist: Debbe Odea, MD; Next Scheduled Visit: 12/05/2022 Nephrologist: Mosetta Pigeon, MD  ; Next Scheduled Visit: 03/16/2023 Urology: [facility]  Medication Access/Adherence  Current Pharmacy:  [facility] [address], [place] - [address] [place] [address] Phone: [phone] Fax: [phone]  [facility] - [place], [place] - [address] [place] [address] Phone: [phone] Fax: [phone]  [facility] - [place], [place] - [address] [place] [address] Phone: [phone] Fax: [phone]   Patient reports affordability concerns with their medications: No  Patient reports access/transportation concerns to their pharmacy: No  Patient reports adherence concerns with their medications:  No     Patient using weekly pillbox     Diabetes:   Current medications:  Rybelsus 14 mg daily Taking ?30 minutes before the first food, beverage, or other oral medications of the day with ?4 oz of plain water  Insulin glargine ([name]/Lantus) 37 units QHS   Medications tried in the past:  Trulicity (GI intolerance) Metformin (unable to tolerate - GI side effects) Bydureon (difficulty with device/administration) Invokana (felt dehydrated)   Current glucose readings:  - This morning fasting: 195 - Reports recent morning fasting readings  ranging 169-244  Denies recent missed doses of his medications   Attributes elevated readings to eating sweets and night and large portions of supper since returned from vacation -States he feels like "I need to get back on schedule" with his eating  Recalls prior to vacation blood sugar reading ranging 130-154  Patient denies hypoglycemic s/sx including dizziness, shakiness, sweating              Current physical activity: reports less active during vacation, but typically stays active throughout the day working and walking some at home   Denies interest in continuous glucose monitoring at this time  Next eye exam scheduled for October 2024   Current medication access support:  - Enrolled in patient assistance for for [employer] from [employer] and Rybelsus from [employer] patient assistance programs through 03/28/2023     Hypertension:   Current medications:  Carvedilol 12.5 mg twice daily Lisinopril 40 mg daily  Imdur 30 mg -1/2 tablet (15 mg) daily   Patient has an automated, upper arm home BP cuff Reports checked BP this morning, reading::127/75, HR 65    Denies symptoms of hypotension   Current physical activity: reports less active during vacation, but typically stays active throughout the day working and walking some at home     Hyperlipidemia/hx of CAD/NSTEMI:   Current lipid lowering medications: Repatha 140 mg every 14 days             Reports tolerating well             Note prior authorization for Repatha approved through 03/28/2023   Previous therapies tried: atorvastatin 40 mg daily & 10 mg daily (unable to tolerate due to myalgia), ezetimibe  Have discussed importance of limiting saturated fats and trans fats in diet   Antiplatelet regimen: Aspirin 81 mg daily Clopidogrel 75 mg daily   Current physical activity: reports less active during vacation, but typically stays active throughout the day working and walking some at home     Objective:  Lab  Results  Component Value Date   HGBA1C 7.1 (A) 09/01/2022    Lab Results  Component Value Date   CREATININE 1.46 (H) 02/21/2022   BUN 20 02/21/2022   NA 137 02/21/2022   K 4.6 02/21/2022   CL 103 02/21/2022   CO2 28 02/21/2022    Lab Results  Component Value Date   CHOL 149 02/21/2022   HDL 37 (L) 02/21/2022   LDLCALC 86 02/21/2022   TRIG 160 (H) 02/21/2022   CHOLHDL 4.0 02/21/2022   BP Readings from Last 3 Encounters:  09/01/22 118/72  07/22/22 (!) 159/82  03/04/22 134/70   Pulse Readings from Last 3 Encounters:  09/01/22 80  07/22/22 87  03/02/22 85    Medications Reviewed Today     Reviewed by Manuela Neptune, RPH-CPP (Pharmacist) on 12/02/22 at 0848  Med List Status: <None>   Medication Order Taking? Sig Documenting Provider Last Dose Status Informant  ACCU-CHEK SMARTVIEW test strip [id number]  TEST BLOOD SUGAR TWO TIMES DAILY [name], DO  Active   acetaminophen (TYLENOL) 500 MG tablet [id number]  Take 1,000 mg by mouth every 8 (eight) hours as needed.  [provider]  Active Spouse/Significant Other, Pharmacy Records  allopurinol (ZYLOPRIM) 100 MG tablet [id number]  TAKE 1 TABLET AT BEDTIME [name], DO  Active   aspirin EC 81 MG EC tablet [id number]  Take 1 tablet (81 mg total) by mouth daily. Swallow whole. Alford Highland, MD  Active Spouse/Significant Other, Pharmacy Records  carvedilol (COREG) 12.5 MG tablet [id number] Yes TAKE 1 TABLET TWICE DAILY Karamalegos, Netta Neat, DO Taking Active   cephALEXin (KEFLEX) 250 MG capsule [id number]  Take 1 capsule (250 mg total) by mouth daily. [name], DO  Active   clopidogrel (PLAVIX) 75 MG tablet [id number]  Take 1 tablet (75 mg total) by mouth daily. Debbe Odea, MD  Active   DROPLET PEN NEEDLES 32G X 4 MM MISC [id number]  USE WITH [name] INSULIN INJECTION DAILY AS INSTRUCTED [name], DO  Active   fluticasone (FLONASE) 50 MCG/ACT nasal  spray [id number]  USE 2 SPRAYS IN EACH NOSTRIL ONCE DAILY - USE FOR 4 TO 6 WEEKS, THEN STOP AND USE SEASONALLY OR AS NEEDED. [name], DO  Active   Insulin Glargine ([name] KWIKPEN) 100 UNIT/ML [id number] Yes DIAL AND INJECT 34 UNITS UNDER THE SKIN ONCE DAILY AT BEDTIME. ( MAX DAILY DOSE IS 34 UNITS)  Patient taking differently: Inject 37 Units into the skin at bedtime.   [name], DO Taking Active   isosorbide mononitrate (IMDUR) 30 MG 24 hr tablet [id number] Yes Take 0.5 tablets (15 mg total) by mouth daily. Debbe Odea, MD Taking Active   lisinopril (ZESTRIL) 40 MG tablet [id number] Yes TAKE 1 TABLET EVERY DAY Karamalegos, Netta Neat, DO Taking Active   nitroGLYCERIN (NITROSTAT) 0.4 MG SL tablet [id number]  Place 1 tablet (0.4 mg total) under the tongue every 5 (five) minutes as needed for chest pain. [name], DO  Active   pantoprazole (PROTONIX) 20 MG tablet [id number]  TAKE 1 TABLET TWICE DAILY BEFORE MEALS Karamalegos, Netta Neat, DO  Active   REPATHA SURECLICK  140 MG/ML SOAJ [id number] Yes Inject 140 mg into the skin every 14 (fourteen) days. [name], DO Taking Active   RYBELSUS 14 MG TABS [id number] Yes Take 1 tablet (14 mg total) by mouth daily before breakfast. [name], DO Taking Active Spouse/Significant Other, Pharmacy Records           Med Note Ronney Asters, [facility] A   Wed Oct 13, 2021  8:56 AM)    [facility] injection [id number]  Inject 0.5 mL into muscle for shingles vaccine. Repeat dose in 2-6 months. [name], DO  Active               Assessment/Plan:   Diabetes: - Have reviewed goal A1c, goal fasting, and goal 2 hour post prandial glucose - Reviewed dietary modifications including: - have well-balanced meals throughout the day while controlling carbohydrate portion sizes and limiting nighttime snacking - patient plans to cut back on evening sweets and portion sizes at  meals - Patient plans to restart being more active throughout the day - Recommend to monitor home blood sugar, keep log of results and have this record to review during appointments, but to contact office or CM Pharmacist sooner for readings outside of established parameters or for symptoms Have encouraged patient to use glucometer to check blood sugar 2 hours after meals (breakfast, lunch or supper) a couple of times/week to use as feedback on impact of dietary choices on blood sugar     Hypertension: - Reviewed long term cardiovascular and renal outcomes of uncontrolled blood pressure - Reviewed appropriate blood pressure monitoring technique and reviewed goal blood pressure.  - Encourage dietary changes, including limiting salt/sodium intake and increasing intake of fruit and vegetables - Recommended to check home blood pressure and heart rate and keep a log of the results and to contact office for readings outside of established parameters   Hyperlipidemia: - Encourage patient to continue taking Repatha as directed as now tolerating well     Follow Up Plan: Clinical Pharmacist will follow up with patient by telephone again on 01/02/2023 at 8:30 AM    Estelle Grumbles, PharmD, Patsy [place], CPP Clinical Pharmacist [facility] [phone]

## 2022-12-02 NOTE — Patient Instructions (Signed)
Goals Addressed             This Visit's Progress    Pharmacy - Patient Goals       Our goal A1c is less than 7%. This corresponds with fasting sugars less than 130 and 2 hour after meal sugars less than 180. Please check your fasting blood sugar each morning and keep a record of the results  Check your blood pressure once daily, and any time you have concerning symptoms like headache, chest pain, dizziness, shortness of breath, or vision changes.   Our goal is less than 130/80.  To appropriately check your blood pressure, make sure you do the following:  1) Avoid caffeine, exercise, or tobacco products for 30 minutes before checking. Empty your bladder. 2) Sit with your back supported in a flat-backed chair. Rest your arm on something flat (arm of the chair, table, etc). 3) Sit still with your feet flat on the floor, resting, for at least 5 minutes.  4) Check your blood pressure. Take 1-2 readings.  5) Write down these readings and bring with you to any provider appointments.  Bring your home blood pressure machine with you to a provider's office for accuracy comparison at least once a year.   Make sure you take your blood pressure medications before you come to any office visit, even if you were asked to fast for labs.   Feel free to call me with any questions or concerns. I look forward to our next call  Wallace Cullens, PharmD, [facility] [place] [phone]

## 2022-12-05 ENCOUNTER — Ambulatory Visit: Payer: Medicare HMO | Attending: Cardiology | Admitting: Cardiology

## 2022-12-05 ENCOUNTER — Encounter: Payer: Self-pay | Admitting: Cardiology

## 2022-12-05 VITALS — BP 138/76 | HR 70 | Ht 71.0 in | Wt 226.0 lb

## 2022-12-05 DIAGNOSIS — I10 Essential (primary) hypertension: Secondary | ICD-10-CM

## 2022-12-05 DIAGNOSIS — E785 Hyperlipidemia, unspecified: Secondary | ICD-10-CM

## 2022-12-05 DIAGNOSIS — I2510 Atherosclerotic heart disease of native coronary artery without angina pectoris: Secondary | ICD-10-CM | POA: Diagnosis not present

## 2022-12-05 NOTE — Progress Notes (Signed)
Cardiology Office Note:    Date:  12/05/2022   ID:  [name], DOB 07-21-1950, MRN [id number]  PCP:  [name], DO  Cardiologist:  Debbe Odea, MD  Electrophysiologist:  None   Referring MD: Saralyn Pilar *   Chief Complaint  Patient presents with   Follow-up    Patient denies new or acute cardiac problems/concerns today.       History of Present Illness:    [name] is a 72 y.o. male with a hx of CAD/NSTEMI (PCI/DES to mLAD 10/2021, RCA 12/2019), hypertension, hyperlipidemia, CKD-3, GERD, PE , chews tobacco who presents for follow-up.    Feels well, states having very mild chest discomfort when he is stressed.  States going through some family issues.  Otherwise doing okay, compliant with medications as prescribed.  Also takes Repatha due to statin intolerance.  Has not checked cholesterol since December 2023.  Prior notes Lexiscan Myoview 05/11/2021 fixed apical defect, no evidence for ischemia.  Low risk study Echocardiogram 12/2019, normal ejection fraction 60 to 65%. Left heart cath 12/30/2019 proximal RCA 90%, proximal LAD 40%, moderate left circumflex disease.  Status post successful angioplasty and drug-eluting stent placement to proximal RCA. Easy bruisability with Brilinta  Past Medical History:  Diagnosis Date   Allergy    CAD (coronary artery disease)    a. 01/2019 MV: EF 55-65%, no ischemia; b. 12/2019 NSTEMI/PCI: LM nl, LAD 40p, 55m, D1 40, LCX nl, OM3 40, RCA 90/85p (2.75x34 Resolute Onyx DES), 33m/d.   Chronic headaches    CKD (chronic kidney disease), stage III (HCC)    Edentulous    no lower teeth   GERD (gastroesophageal reflux disease)    Gout    Heart burn    History of echocardiogram    a. 12/2019 Echo: EF 60-65%, no rwma, triv MR.   Hyperlipidemia    Hypertension    Motion sickness    boats   NSTEMI (non-ST elevated myocardial infarction) (HCC) 12/27/2019   Pulmonary embolism (HCC) 05/2017   Type 2  diabetes mellitus [facility])     Past Surgical History:  Procedure Laterality Date   BACK SURGERY  02/26/2019   CATARACT EXTRACTION W/PHACO Left 04/14/2021   Procedure: CATARACT EXTRACTION PHACO AND INTRAOCULAR LENS PLACEMENT (IOC) LEFT DIABETIC maylugin;  Surgeon: Lockie Mola, MD;  Location: [facility];  Service: Ophthalmology;  Laterality: Left;  11.22 1:25.1   CATARACT EXTRACTION W/PHACO Right 06/09/2021   Procedure: CATARACT EXTRACTION PHACO AND INTRAOCULAR LENS PLACEMENT (IOC) RIGHT DIABETIC MALYUGIN;  Surgeon: Lockie Mola, MD;  Location: [facility];  Service: Ophthalmology;  Laterality: Right;  Diabetic 4.75 01:05.7   CORONARY STENT INTERVENTION N/A 10/26/2021   Procedure: CORONARY STENT INTERVENTION;  Surgeon: Yvonne Kendall, MD;  Location: [facility];  Service: Cardiovascular;  Laterality: N/A;   CORONARY ULTRASOUND/IVUS N/A 10/26/2021   Procedure: Intravascular Ultrasound/IVUS;  Surgeon: Yvonne Kendall, MD;  Location: [facility];  Service: Cardiovascular;  Laterality: N/A;   KNEE SURGERY     LEFT HEART CATH AND CORONARY ANGIOGRAPHY N/A 12/30/2019   Procedure: LEFT HEART CATH AND CORONARY ANGIOGRAPHY;  Surgeon: Iran Ouch, MD;  Location: [facility];  Service: Cardiovascular;  Laterality: N/A;   LEFT HEART CATH AND CORONARY ANGIOGRAPHY N/A 10/26/2021   Procedure: LEFT HEART CATH AND CORONARY ANGIOGRAPHY;  Surgeon: Yvonne Kendall, MD;  Location: [facility] IN[facility] CV [facility];  Service: Cardiovascular;  Laterality: N/A;   SHOULDER SURGERY     TONSILLECTOMY  Current Medications: Current Meds  Medication Sig   ACCU-CHEK SMARTVIEW test strip TEST BLOOD SUGAR TWO TIMES DAILY   acetaminophen (TYLENOL) 500 MG tablet Take 1,000 mg by mouth every 8 (eight) hours as needed.    allopurinol (ZYLOPRIM) 100 MG tablet TAKE 1 TABLET AT BEDTIME   aspirin EC 81 MG EC tablet Take 1 tablet (81 mg total) by mouth daily. Swallow whole.    carvedilol (COREG) 12.5 MG tablet TAKE 1 TABLET TWICE DAILY   clopidogrel (PLAVIX) 75 MG tablet Take 1 tablet (75 mg total) by mouth daily.   DROPLET PEN NEEDLES 32G X 4 MM MISC USE WITH [name] INSULIN INJECTION DAILY AS INSTRUCTED   fluticasone (FLONASE) 50 MCG/ACT nasal spray USE 2 SPRAYS IN EACH NOSTRIL ONCE DAILY - USE FOR 4 TO 6 WEEKS, THEN STOP AND USE SEASONALLY OR AS NEEDED.   Insulin Glargine ([name] KWIKPEN) 100 UNIT/ML DIAL AND INJECT 34 UNITS UNDER THE SKIN ONCE DAILY AT BEDTIME. ( MAX DAILY DOSE IS 34 UNITS) (Patient taking differently: Inject 37 Units into the skin at bedtime.)   isosorbide mononitrate (IMDUR) 30 MG 24 hr tablet Take 0.5 tablets (15 mg total) by mouth daily.   lisinopril (ZESTRIL) 40 MG tablet TAKE 1 TABLET EVERY DAY   nitroGLYCERIN (NITROSTAT) 0.4 MG SL tablet Place 1 tablet (0.4 mg total) under the tongue every 5 (five) minutes as needed for chest pain.   pantoprazole (PROTONIX) 20 MG tablet TAKE 1 TABLET TWICE DAILY BEFORE MEALS   REPATHA SURECLICK 140 MG/ML SOAJ Inject 140 mg into the skin every 14 (fourteen) days.   RYBELSUS 14 MG TABS Take 1 tablet (14 mg total) by mouth daily before breakfast.     Allergies:   Crestor [rosuvastatin calcium], Ciprofloxacin, Invokana [canagliflozin], Prednisone, and Sulfamethizole   Social History   Socioeconomic History   Marital status: Married    Spouse name: Not on file   Number of children: Not on file   Years of education: Not on file   Highest education level: 9th grade  Occupational History   Occupation: retired  Tobacco Use   Smoking status: Former    Types: Cigars    Quit date: 2000    Years since quitting: 24.7   Smokeless tobacco: Current    Types: Chew   Tobacco comments:    wife states patient never smoked cigarettes, just chewing tobacco  Vaping Use   Vaping status: Never Used  Substance and Sexual Activity   Alcohol use: Not Currently    Alcohol/week: 0.0 standard drinks of alcohol   Drug  use: No   Sexual activity: Yes    Birth control/protection: Inserts  Other Topics Concern   Not on file  Social History Narrative   Not on file   Social Determinants of Health   Financial Resource Strain: Low Risk  (08/29/2022)   Overall Financial Resource Strain (CARDIA)    Difficulty of Paying Living Expenses: Not very hard  Food Insecurity: No Food Insecurity (08/29/2022)   Hunger Vital Sign    Worried About Running Out of Food in the Last Year: Never true    Ran Out of Food in the Last Year: Never true  Transportation Needs: No Transportation Needs (08/29/2022)   PRAPARE - Administrator, Civil Service (Medical): No    Lack of Transportation (Non-Medical): No  Physical Activity: Sufficiently Active (08/29/2022)   Exercise Vital Sign    Days of Exercise per Week: 5 days    Minutes of  Exercise per Session: 150+ min  Stress: No Stress Concern Present (08/29/2022)   [employer] of Occupational Health - Occupational Stress Questionnaire    Feeling of Stress : Not at all  Social Connections: Unknown (08/29/2022)   Social Connection and Isolation Panel [NHANES]    Frequency of Communication with Friends and Family: More than three times a week    Frequency of Social Gatherings with Friends and Family: Three times a week    Attends Religious Services: Patient declined    Active Member of Clubs or Organizations: No    Attends Banker Meetings: Never    Marital Status: Married     Family History: The patient's family history includes Cancer in his father; Heart disease in his brother and sister. There is no history of Prostate cancer.  ROS:   Please see the history of present illness.     All other systems reviewed and are negative.  EKGs/Labs/Other Studies Reviewed:    The following studies were reviewed today:   EKG Interpretation Date/Time:  Monday December 05 2022 10:41:20 EDT Ventricular Rate:  70 PR Interval:  220 QRS Duration:  98 QT  Interval:  384 QTC Calculation: 414 R Axis:   -56  Text Interpretation: Sinus rhythm with 1st degree A-V block Left axis deviation Septal infarct (cited on or before 27-Oct-2021) Confirmed by Debbe Odea ([id number]) on 12/05/2022 10:43:44 AM    Recent Labs: 02/21/2022: ALT 26; BUN 20; Creat 1.46; Hemoglobin 13.0; Platelets 246; Potassium 4.6; Sodium 137; TSH 4.08  Recent Lipid Panel    Component Value Date/Time   CHOL 149 02/21/2022 0825   CHOL 212 (H) 11/12/2019 0829   TRIG 160 (H) 02/21/2022 0825   HDL 37 (L) 02/21/2022 0825   HDL 32 (L) 11/12/2019 0829   CHOLHDL 4.0 02/21/2022 0825   VLDL 37 12/29/2019 0026   LDLCALC 86 02/21/2022 0825    Physical Exam:    VS:  BP 138/76 (BP Location: Left Arm, Patient Position: Sitting, Cuff Size: Large)   Pulse 70   Ht 5\' 11"  (1.803 m)   Wt 226 lb (102.5 kg)   SpO2 98%   BMI 31.52 kg/m     Wt Readings from Last 3 Encounters:  12/05/22 226 lb (102.5 kg)  09/01/22 223 lb (101.2 kg)  03/04/22 232 lb 6.4 oz (105.4 kg)     GEN:  Well nourished, well developed in no acute distress HEENT: Normal NECK: No JVD; No carotid bruits CARDIAC: RRR, no murmurs, rubs, gallops RESPIRATORY:  Clear to auscultation without rales, wheezing or rhonchi  ABDOMEN: Soft, non-tender, non-distended MUSCULOSKELETAL:  No edema; right chest wall tenderness reproducible with palpation SKIN: Warm and dry NEUROLOGIC:  Alert and oriented x 3 PSYCHIATRIC:  Normal affect   ASSESSMENT:    1. Coronary artery disease involving native coronary artery of native heart, unspecified whether angina present   2. Primary hypertension   3. Hyperlipidemia LDL goal <70    PLAN:    CAD/NSTEMI s/p PCI (to mLAD 10/2021, PCI to RCA 12/2019).  Minimal chest discomfort when feeling anxious or stressed.  Continue aspirin, Plavix, Imdur 15 mg daily, Repatha.  echo with EF 60%. hypertension, BP controlled.  Continue Coreg, lisinopril, Imdur hyperlipidemia, continue Repatha.   History of myalgias with statins.  Obtain fasting lipid profile  Follow-up in 12 months   Medication Adjustments/Labs and Tests Ordered: Current medicines are reviewed at length with the patient today.  Concerns regarding medicines are outlined above.  Orders Placed  This Encounter  Procedures   Lipid panel   EKG 12-Lead    No orders of the defined types were placed in this encounter.    Patient Instructions  Medication Instructions:   Your physician recommends that you continue on your current medications as directed. Please refer to the Current Medication list given to you today.  *If you need a refill on your cardiac medications before your next appointment, please call your pharmacy*   Lab Work:  Your provider would like for you to return at your earliest convince  to have the following labs drawn: LIPID.   Please go to [facility] [address] ([facility] [address], [place] [address] You do not need an appointment.  They are open from 7:30 am-4 pm.  Lunch from 1:00 pm- 2:00 pm You WILL need to be fasting.    If you have labs (blood work) drawn today and your tests are completely normal, you will receive your results only by: MyChart Message (if you have MyChart) OR A paper copy in the mail If you have any lab test that is abnormal or we need to change your treatment, we will call you to review the results.   Testing/Procedures:  None Ordered   Follow-Up: At [facility], you and your health needs are our priority.  As part of our continuing mission to provide you with exceptional heart care, we have created designated Provider Care Teams.  These Care Teams include your primary Cardiologist (physician) and Advanced Practice Providers (APPs -  Physician Assistants and Nurse Practitioners) who all work together to provide you with the care you need, when you need it.  We recommend signing up for the patient portal called "MyChart".   Sign up information is provided on this After Visit Summary.  MyChart is used to connect with patients for Virtual Visits (Telemedicine).  Patients are able to view lab/test results, encounter notes, upcoming appointments, etc.  Non-urgent messages can be sent to your provider as well.   To learn more about what you can do with MyChart, go to [email or web address].    Your next appointment:   12 month(s)  Provider:   You may see Debbe Odea, MD or one of the following Advanced Practice Providers on your designated Care Team:   Nicolasa Ducking, NP Eula Listen, PA-C Cadence Fransico Michael, PA-C Charlsie Quest, NP   Signed, Debbe Odea, MD  12/05/2022 11:09 AM    [facility]

## 2022-12-05 NOTE — Patient Instructions (Addendum)
Medication Instructions:   Your physician recommends that you continue on your current medications as directed. Please refer to the Current Medication list given to you today.  *If you need a refill on your cardiac medications before your next appointment, please call your pharmacy*   Lab Work:  Your provider would like for you to return at your earliest convince  to have the following labs drawn: LIPID.   Please go to [facility] [address] ([facility] [address], [place] [address] You do not need an appointment.  They are open from 7:30 am-4 pm.  Lunch from 1:00 pm- 2:00 pm You WILL need to be fasting.    If you have labs (blood work) drawn today and your tests are completely normal, you will receive your results only by: MyChart Message (if you have MyChart) OR A paper copy in the mail If you have any lab test that is abnormal or we need to change your treatment, we will call you to review the results.   Testing/Procedures:  None Ordered   Follow-Up: At [facility], you and your health needs are our priority.  As part of our continuing mission to provide you with exceptional heart care, we have created designated Provider Care Teams.  These Care Teams include your primary Cardiologist (physician) and Advanced Practice Providers (APPs -  Physician Assistants and Nurse Practitioners) who all work together to provide you with the care you need, when you need it.  We recommend signing up for the patient portal called "MyChart".  Sign up information is provided on this After Visit Summary.  MyChart is used to connect with patients for Virtual Visits (Telemedicine).  Patients are able to view lab/test results, encounter notes, upcoming appointments, etc.  Non-urgent messages can be sent to your provider as well.   To learn more about what you can do with MyChart, go to [email or web address].    Your next appointment:   12 month(s)  Provider:    You may see Debbe Odea, MD or one of the following Advanced Practice Providers on your designated Care Team:   Nicolasa Ducking, NP Eula Listen, PA-C Cadence Fransico Michael, PA-C Charlsie Quest, NP

## 2022-12-15 ENCOUNTER — Other Ambulatory Visit: Payer: Self-pay | Admitting: Family Medicine

## 2022-12-15 DIAGNOSIS — E1169 Type 2 diabetes mellitus with other specified complication: Secondary | ICD-10-CM

## 2022-12-16 NOTE — Telephone Encounter (Signed)
Requested Prescriptions  Pending Prescriptions Disp Refills   Insulin Pen Needle (DROPLET PEN NEEDLES) 32G X 4 MM MISC [Pharmacy Med Name: Droplet Pen Needles Miscellaneous 32G X 4 MM] 100 each 2    Sig: USE WITH [name] INSULIN INJECTION DAILY AS INSTRUCTED     Endocrinology: Diabetes - Testing Supplies Passed - 12/15/2022  9:58 AM      Passed - Valid encounter within last 12 months    Recent Outpatient Visits           2 weeks ago Type 2 diabetes mellitus with other specified complication, with long-term current use of insulin (HCC)   [place] [facility] Delles, Gentry Fitz A, RPH-CPP   2 months ago Type 2 diabetes mellitus with other specified complication, with long-term current use of insulin (HCC)   [place] [facility] Delles, Gentry Fitz A, RPH-CPP   3 months ago Type 2 diabetes mellitus with other specified complication, with long-term current use of insulin [facility])   [place] [facility] McDonald, Netta Neat, DO   4 months ago Upper respiratory tract infection, unspecified type   [place] [facility] Mecum, Erin E, PA-C   5 months ago Benign hypertension with CKD (chronic kidney disease) stage III [facility])   [place] [facility] Delles, Jackelyn Poling, RPH-CPP       Future Appointments             In 2 months Althea Charon, Netta Neat, DO  [facility], [facility]

## 2022-12-26 ENCOUNTER — Other Ambulatory Visit: Payer: Self-pay

## 2022-12-26 DIAGNOSIS — I2510 Atherosclerotic heart disease of native coronary artery without angina pectoris: Secondary | ICD-10-CM

## 2022-12-26 MED ORDER — CLOPIDOGREL BISULFATE 75 MG PO TABS: 75.0000 mg | ORAL_TABLET | Freq: Every day | ORAL | 2 refills | Status: DC

## 2022-12-26 MED ORDER — ISOSORBIDE MONONITRATE ER 30 MG PO TB24
15.0000 mg | ORAL_TABLET | Freq: Every day | ORAL | 2 refills | Status: DC
Start: 2022-12-26 — End: 2023-08-03

## 2022-12-26 NOTE — Telephone Encounter (Signed)
Requested Prescriptions   Signed Prescriptions Disp Refills   isosorbide mononitrate (IMDUR) 30 MG 24 hr tablet 45 tablet 2    Sig: Take 0.5 tablets (15 mg total) by mouth daily.    Authorizing Provider: Debbe Odea    Ordering User: Guerry Minors   clopidogrel (PLAVIX) 75 MG tablet 90 tablet 2    Sig: Take 1 tablet (75 mg total) by mouth daily.    Authorizing Provider: Debbe Odea    Ordering User: Guerry Minors

## 2022-12-28 DIAGNOSIS — I2510 Atherosclerotic heart disease of native coronary artery without angina pectoris: Secondary | ICD-10-CM | POA: Diagnosis not present

## 2022-12-28 DIAGNOSIS — E785 Hyperlipidemia, unspecified: Secondary | ICD-10-CM | POA: Diagnosis not present

## 2022-12-29 DIAGNOSIS — H40003 Preglaucoma, unspecified, bilateral: Secondary | ICD-10-CM | POA: Diagnosis not present

## 2022-12-29 DIAGNOSIS — H353131 Nonexudative age-related macular degeneration, bilateral, early dry stage: Secondary | ICD-10-CM | POA: Diagnosis not present

## 2022-12-29 DIAGNOSIS — E119 Type 2 diabetes mellitus without complications: Secondary | ICD-10-CM | POA: Diagnosis not present

## 2022-12-29 DIAGNOSIS — Z961 Presence of intraocular lens: Secondary | ICD-10-CM | POA: Diagnosis not present

## 2022-12-29 LAB — LIPID PANEL
Chol/HDL Ratio: 3.9 {ratio} (ref 0.0–5.0)
Cholesterol, Total: 120 mg/dL (ref 100–199)
HDL: 31 mg/dL — ABNORMAL LOW (ref >39)
LDL Chol Calc (NIH): 55 mg/dL (ref 0–99)
Triglycerides: 204 mg/dL — ABNORMAL HIGH (ref 0–149)
VLDL Cholesterol Cal: 34 mg/dL (ref 5–40)

## 2022-12-29 LAB — HM DIABETES EYE EXAM

## 2023-01-02 ENCOUNTER — Ambulatory Visit: Payer: Medicare HMO | Admitting: Pharmacist

## 2023-01-02 DIAGNOSIS — E785 Hyperlipidemia, unspecified: Secondary | ICD-10-CM

## 2023-01-02 DIAGNOSIS — I129 Hypertensive chronic kidney disease with stage 1 through stage 4 chronic kidney disease, or unspecified chronic kidney disease: Secondary | ICD-10-CM

## 2023-01-02 DIAGNOSIS — Z794 Long term (current) use of insulin: Secondary | ICD-10-CM

## 2023-01-02 NOTE — Patient Instructions (Signed)
Goals Addressed             This Visit's Progress    Pharmacy - Patient Goals       Our goal A1c is less than 7%. This corresponds with fasting sugars less than 130 and 2 hour after meal sugars less than 180. Please check your fasting blood sugar each morning and keep a record of the results  Check your blood pressure once daily, and any time you have concerning symptoms like headache, chest pain, dizziness, shortness of breath, or vision changes.   Our goal is less than 130/80.  To appropriately check your blood pressure, make sure you do the following:  1) Avoid caffeine, exercise, or tobacco products for 30 minutes before checking. Empty your bladder. 2) Sit with your back supported in a flat-backed chair. Rest your arm on something flat (arm of the chair, table, etc). 3) Sit still with your feet flat on the floor, resting, for at least 5 minutes.  4) Check your blood pressure. Take 1-2 readings.  5) Write down these readings and bring with you to any provider appointments.  Bring your home blood pressure machine with you to a provider's office for accuracy comparison at least once a year.   Make sure you take your blood pressure medications before you come to any office visit, even if you were asked to fast for labs.   Feel free to call me with any questions or concerns. I look forward to our next call  Wallace Cullens, PharmD, [facility] [place] [phone]

## 2023-01-02 NOTE — Progress Notes (Signed)
01/02/2023 Name: [name] MRN: [id number] DOB: Aug 01, 1950  Chief Complaint  Patient presents with   Medication Management   Medication Assistance    [name] is a 72 y.o. year old male who presented for a telephone visit.   They were referred to the pharmacist by their PCP for assistance in managing diabetes, hypertension, hyperlipidemia, and medication access.      Subjective:   Care Team: Primary Care Provider: [name], DO ; Next Scheduled Visit: 03/10/2023 Cardiologist: Daniel Odea, MD Nephrologist: Daniel Pigeon, MD  ; Next Scheduled Visit: 03/16/2023 Berg: [name]  Medication Access/Adherence  Current Pharmacy:  [facility] [address] [place] - [address] [place] [address] Phone: [phone] Fax: [phone]  [facility] - [place], [place] - [address] [place] [address] Phone: [phone] Fax: [phone]  [facility] - [place], [place] - [address] [place] [address] Phone: [phone] Fax: [phone]   Patient reports affordability concerns with their medications: No  Patient reports access/transportation concerns to their pharmacy: No  Patient reports adherence concerns with their medications:  No     Patient using weekly pillbox     Diabetes:   Current medications:  Rybelsus 14 mg daily Taking >=30 minutes before the first food, beverage, or other oral medications of the day with <=4 oz of plain water  Insulin glargine ([name]/Lantus) 37 units QHS   Medications tried in the past:  Trulicity (GI intolerance) Metformin (unable to tolerate - GI side effects) Bydureon (difficulty with device/administration) Invokana (felt dehydrated)   Current glucose readings:  - This morning fasting: 136 - Reports recent morning fasting readings ranging  109-225    Attributes variation in readings to larger portion sizes/portions of carbohydrates while on vacation and more snacking throughout the day when sitting around at home/days not working -States that he knows what he needs to do to improve dietary habits, but has noticed that he eats more when sitting around. Plans to work on being more active during the day by working out in garage when at home   Patient denies hypoglycemic s/sx including dizziness, shakiness, sweating              Current physical activity: reports less active during vacation, but typically stays active throughout the day working and walking some at home   Denies interest in continuous glucose monitoring at this time   Completed eye exam last week   Current medication access support:  - Enrolled in patient assistance for for [employer] from [employer] and Rybelsus from [employer] patient assistance programs through 03/28/2023     Hypertension:   Current medications:  Carvedilol 12.5 mg twice daily Lisinopril 40 mg daily  Imdur 30 mg -1/2 tablet (15 mg) daily   Patient has an automated, upper arm home BP cuff Reports checked BP this morning, reading::136/71, HR 71     Denies symptoms of hypotension   Current physical activity: reports less active during vacation, but typically stays active throughout the day working and walking some at home     Hyperlipidemia/hx of CAD/NSTEMI:   Current lipid lowering medications: Repatha 140 mg every 14 days             Reports tolerating well             Note prior authorization for Repatha approved through 03/28/2023   Previous  therapies tried: atorvastatin 40 mg daily & 10 mg daily (unable to tolerate due to myalgia), ezetimibe    Have discussed importance of limiting saturated fats and trans fats in diet   Antiplatelet regimen: Aspirin 81 mg daily Clopidogrel 75 mg daily   Current physical activity: reports less active during vacation, but typically stays active  throughout the day working and walking some at home     Objective:  Lab Results  Component Value Date   HGBA1C 7.1 (A) 09/01/2022    Lab Results  Component Value Date   CREATININE 1.46 (H) 02/21/2022   BUN 20 02/21/2022   NA 137 02/21/2022   K 4.6 02/21/2022   CL 103 02/21/2022   CO2 28 02/21/2022    Lab Results  Component Value Date   CHOL 120 12/28/2022   HDL 31 (L) 12/28/2022   LDLCALC 55 12/28/2022   TRIG 204 (H) 12/28/2022   CHOLHDL 3.9 12/28/2022    Medications Reviewed Today     Reviewed by [name], [name] (Pharmacist) on 01/02/23 at 0847  Med List Status: <None>   Medication Order Taking? Sig Documenting Provider Last Dose Status Informant  ACCU-CHEK SMARTVIEW test strip [id number]  TEST BLOOD SUGAR TWO TIMES DAILY Daniel Cords, DO  Active   acetaminophen (TYLENOL) 500 MG tablet [id number]  Take 1,000 mg by mouth every 8 (eight) hours as needed.  [provider]  Active Spouse/Significant Other, Pharmacy Records  allopurinol (ZYLOPRIM) 100 MG tablet [id number]  TAKE 1 TABLET AT BEDTIME Daniel Cords, DO  Active   aspirin EC 81 MG EC tablet [id number] Yes Take 1 tablet (81 mg total) by mouth daily. Swallow whole. Daniel Highland, MD Taking Active Spouse/Significant Other, Pharmacy Records  carvedilol (COREG) 12.5 MG tablet [id number] Yes TAKE 1 TABLET TWICE DAILY [name], Daniel Neat, DO Taking Active   cephALEXin (KEFLEX) 250 MG capsule [id number] No Take 1 capsule (250 mg total) by mouth daily.  Patient not taking: Reported on 12/05/2022   Daniel Cords, DO Not Taking Active   clopidogrel (PLAVIX) 75 MG tablet [id number] Yes Take 1 tablet (75 mg total) by mouth daily. Daniel Odea, MD Taking Active   fluticasone (FLONASE) 50 MCG/ACT nasal spray [id number]  USE 2 SPRAYS IN EACH NOSTRIL ONCE DAILY - USE FOR 4 TO 6 WEEKS, THEN STOP AND USE SEASONALLY OR AS NEEDED. Daniel Cords, DO  Active   Insulin  Glargine ([name] KWIKPEN) 100 UNIT/ML [id number] Yes DIAL AND INJECT 34 UNITS UNDER THE SKIN ONCE DAILY AT BEDTIME. ( MAX DAILY DOSE IS 34 UNITS)  Patient taking differently: Inject 37 Units into the skin at bedtime.   Daniel Cords, DO Taking Active   Insulin Pen Needle (DROPLET PEN NEEDLES) 32G X 4 MM MISC [id number]  USE WITH [name] INSULIN INJECTION DAILY AS INSTRUCTED Althea Charon Daniel Neat, DO  Active   isosorbide mononitrate (IMDUR) 30 MG 24 hr tablet [id number] Yes Take 0.5 tablets (15 mg total) by mouth daily. Daniel Odea, MD Taking Active   lisinopril (ZESTRIL) 40 MG tablet [id number] Yes TAKE 1 TABLET EVERY DAY [name], Daniel Neat, DO Taking Active   nitroGLYCERIN (NITROSTAT) 0.4 MG SL tablet [id number]  Place 1 tablet (0.4 mg total) under the tongue every 5 (five) minutes as needed for chest pain. Daniel Cords, DO  Active   pantoprazole (PROTONIX) 20 MG tablet [id number]  TAKE 1 TABLET TWICE DAILY BEFORE MEALS [name], Daniel Neat, DO  Active   REPATHA SURECLICK 140 MG/ML  SOAJ [id number] Yes Inject 140 mg into the skin every 14 (fourteen) days. Daniel Cords, DO Taking Active   RYBELSUS 14 MG TABS [id number] Yes Take 1 tablet (14 mg total) by mouth daily before breakfast. Daniel Cords, DO Taking Active Spouse/Significant Other, Pharmacy Records           Med Note Ronney Asters, [facility] A   Wed Oct 13, 2021  8:56 AM)                Assessment/Plan:   Diabetes: - Reviewed goal A1c, goal fasting, and goal 2 hour post prandial glucose - Reviewed dietary modifications including: - have well-balanced meals throughout the day while controlling carbohydrate portion sizes and limiting nighttime snacking - patient plans to cut back on evening sweets and portion sizes at meals - Patient plans to work on being more active throughout the day - Recommend to monitor home blood sugar, keep log of results and have this record to review  during appointments, but to contact office or CM Pharmacist sooner for readings outside of established parameters or for symptoms Have encouraged patient to use glucometer to check blood sugar 2 hours after meals (breakfast, lunch or supper) a couple of times/week to use as feedback on impact of dietary choices on blood sugar - Meets financial criteria for [name] from [place] and Rybelsus from [employer] patient assistance programs. Will collaborate with provider, CPhT, and patient to pursue assistance with re-enrollment.   Hypertension: - Reviewed long term cardiovascular and renal outcomes of uncontrolled blood pressure - Reviewed appropriate blood pressure monitoring technique and reviewed goal blood pressure.  - Encourage dietary changes, including limiting salt/sodium intake and increasing intake of fruit and vegetables - Recommended to check home blood pressure and heart rate and keep a log of the results and to contact office for readings outside of established parameters   Hyperlipidemia: - Encourage patient to continue taking Repatha as directed as now tolerating well     Follow Up Plan: Clinical Pharmacist will follow up with patient by telephone again on 01/30/2023 at 8:00 AM    Estelle Grumbles, PharmD, Patsy Baltimore, CPP Clinical Pharmacist [facility] [phone]

## 2023-01-04 ENCOUNTER — Encounter: Payer: Self-pay | Admitting: Family Medicine

## 2023-01-12 ENCOUNTER — Telehealth: Payer: Self-pay | Admitting: Pharmacy Technician

## 2023-01-12 DIAGNOSIS — Z5986 Financial insecurity: Secondary | ICD-10-CM

## 2023-01-12 NOTE — Progress Notes (Signed)
[facility] Customer service manager [facility])                                            [facility] Quality Pharmacy Team    01/12/2023  [name] 03-Feb-1951 [id number]                                      Medication Assistance Referral  Referral From: [facility] Embedded RPh Vallery Sa   Medication/Company: Mariella [employer] / [name] Patient application portion:  Mailed Provider application portion: Faxed  to Dr. Anne Hahn Provider address/fax verified via: Office website  Medication/Company: [employer] / [employer] Patient application portion:  Mining engineer portion: Faxed  to Dr. Saralyn Pilar Provider address/fax verified via: Office website    Pattricia Boss, CPhT [place]  Office: [phone] Fax: [phone] Email: [email or web address]

## 2023-01-21 ENCOUNTER — Other Ambulatory Visit: Payer: Self-pay | Admitting: Family Medicine

## 2023-01-21 DIAGNOSIS — I10 Essential (primary) hypertension: Secondary | ICD-10-CM

## 2023-01-21 DIAGNOSIS — K219 Gastro-esophageal reflux disease without esophagitis: Secondary | ICD-10-CM

## 2023-01-21 DIAGNOSIS — M1000 Idiopathic gout, unspecified site: Secondary | ICD-10-CM

## 2023-01-23 NOTE — Telephone Encounter (Signed)
Requested Prescriptions  Pending Prescriptions Disp Refills   pantoprazole (PROTONIX) 20 MG tablet [Pharmacy Med Name: Pantoprazole Sodium Oral Tablet Delayed Release 20 MG] 180 tablet 2    Sig: TAKE 1 TABLET TWICE DAILY BEFORE MEALS     Gastroenterology: Proton Pump Inhibitors Passed - 01/21/2023  3:03 PM      Passed - Valid encounter within last 12 months    Recent Outpatient Visits           3 weeks ago Type 2 diabetes mellitus with other specified complication, with long-term current use of insulin (HCC)   [place] [facility] Delles, Gentry Fitz A, RPH-CPP   1 month ago Type 2 diabetes mellitus with other specified complication, with long-term current use of insulin (HCC)   [place] [facility] Delles, Gentry Fitz A, RPH-CPP   3 months ago Type 2 diabetes mellitus with other specified complication, with long-term current use of insulin (HCC)   [place] [facility] Delles, Gentry Fitz A, RPH-CPP   4 months ago Type 2 diabetes mellitus with other specified complication, with long-term current use of insulin (HCC)   [place] [facility] [place], Netta Neat, DO   6 months ago Upper respiratory tract infection, unspecified type   [place] [facility] Mecum, Oswaldo Conroy, PA-C       Future Appointments             In 1 month Karamalegos, Netta Neat, DO [place] [facility]             lisinopril (ZESTRIL) 40 MG tablet [Pharmacy Med Name: Lisinopril Oral Tablet 40 MG] 90 tablet 0    Sig: TAKE 1 TABLET EVERY DAY     Cardiovascular:  ACE Inhibitors Failed - 01/21/2023  3:03 PM      Failed - Cr in normal range and within 180 days    Creat  Date Value Ref Range Status  02/21/2022 1.46 (H) 0.70 - 1.28 mg/dL Final   Creatinine, Urine  Date Value Ref Range Status  08/12/2021 133 20 - 320 mg/dL Final         Failed - K in normal range and within 180 days    Potassium   Date Value Ref Range Status  02/21/2022 4.6 3.5 - 5.3 mmol/L Final  10/21/2011 5.0 3.5 - 5.1 mmol/L Final         Passed - Patient is not pregnant      Passed - Last BP in normal range    BP Readings from Last 1 Encounters:  12/05/22 138/76         Passed - Valid encounter within last 6 months    Recent Outpatient Visits           3 weeks ago Type 2 diabetes mellitus with other specified complication, with long-term current use of insulin (HCC)   [place] [facility] Delles, Gentry Fitz A, RPH-CPP   1 month ago Type 2 diabetes mellitus with other specified complication, with long-term current use of insulin (HCC)   [place] [facility] Delles, Gentry Fitz A, RPH-CPP   3 months ago Type 2 diabetes mellitus with other specified complication, with long-term current use of insulin [facility])   [place] [facility] Delles, Gentry Fitz A, RPH-CPP   4 months ago Type 2 diabetes mellitus with other specified complication, with long-term current use of insulin (HCC)     [facility] Althea Charon, Netta Neat, DO   6 months ago Upper respiratory tract infection, unspecified type   [place] [facility] Mecum, Oswaldo Conroy, PA-C       Future Appointments             In 1 month Althea Charon, Netta Neat, DO [place] [facility]             carvedilol (COREG) 12.5 MG tablet [Pharmacy Med Name: Carvedilol Oral Tablet 12.5 MG] 180 tablet 1    Sig: TAKE 1 TABLET TWICE DAILY     Cardiovascular: Beta Blockers 3 Failed - 01/21/2023  3:03 PM      Failed - Cr in normal range and within 360 days    Creat  Date Value Ref Range Status  02/21/2022 1.46 (H) 0.70 - 1.28 mg/dL Final   Creatinine, Urine  Date Value Ref Range Status  08/12/2021 133 20 - 320 mg/dL Final         Passed - AST in normal range and within 360 days    AST  Date Value Ref Range Status  02/21/2022 27 10 - 35 U/L  Final         Passed - ALT in normal range and within 360 days    ALT  Date Value Ref Range Status  02/21/2022 26 9 - 46 U/L Final         Passed - Last BP in normal range    BP Readings from Last 1 Encounters:  12/05/22 138/76         Passed - Last Heart Rate in normal range    Pulse Readings from Last 1 Encounters:  12/05/22 70         Passed - Valid encounter within last 6 months    Recent Outpatient Visits           3 weeks ago Type 2 diabetes mellitus with other specified complication, with long-term current use of insulin (HCC)   [place] [facility] Delles, Gentry Fitz A, RPH-CPP   1 month ago Type 2 diabetes mellitus with other specified complication, with long-term current use of insulin (HCC)   [place] [facility] Delles, Gentry Fitz A, RPH-CPP   3 months ago Type 2 diabetes mellitus with other specified complication, with long-term current use of insulin (HCC)   [place] [facility] Delles, Gentry Fitz A, RPH-CPP   4 months ago Type 2 diabetes mellitus with other specified complication, with long-term current use of insulin [facility])   [place] [facility] McKenzie, Netta Neat, DO   6 months ago Upper respiratory tract infection, unspecified type   [place] [facility] Mecum, Oswaldo Conroy, PA-C       Future Appointments             In 1 month Althea Charon, Netta Neat, DO [place] [facility]             allopurinol (ZYLOPRIM) 100 MG tablet [Pharmacy Med Name: Allopurinol Oral Tablet 100 MG] 90 tablet 2    Sig: TAKE 1 TABLET AT BEDTIME     Endocrinology:  Gout Agents - allopurinol Failed - 01/21/2023  3:03 PM      Failed - Cr in normal range and within 360 days    Creat  Date Value Ref Range Status  02/21/2022 1.46 (H) 0.70 - 1.28 mg/dL Final   Creatinine, Urine  Date Value Ref Range Status  08/12/2021 133 20 - 320 mg/dL Final         Passed -  Uric Acid in normal range and within 360 days    Uric Acid, Serum  Date Value Ref Range Status  02/21/2022 5.7 4.0 - 8.0 mg/dL Final    Comment:    Therapeutic target for gout patients: <6.0 mg/dL .    Uric Acid  Date Value Ref Range Status  11/12/2019 6.6 3.8 - 8.4 mg/dL Final    Comment:               Therapeutic target for gout patients: <6.0         Passed - Valid encounter within last 12 months    Recent Outpatient Visits           3 weeks ago Type 2 diabetes mellitus with other specified complication, with long-term current use of insulin (HCC)   [place] [facility] Delles, Gentry Fitz A, RPH-CPP   1 month ago Type 2 diabetes mellitus with other specified complication, with long-term current use of insulin (HCC)   [place] [facility] Delles, Gentry Fitz A, RPH-CPP   3 months ago Type 2 diabetes mellitus with other specified complication, with long-term current use of insulin (HCC)   [place] [facility] Delles, Gentry Fitz A, RPH-CPP   4 months ago Type 2 diabetes mellitus with other specified complication, with long-term current use of insulin (HCC)   [place] [facility] [place], Netta Neat, DO   6 months ago Upper respiratory tract infection, unspecified type   [place] [facility] Mecum, Oswaldo Conroy, PA-C       Future Appointments             In 1 month Althea Charon, Netta Neat, DO  [facility]            Passed - CBC within normal limits and completed in the last 12 months    WBC  Date Value Ref Range Status  02/21/2022 6.2 3.8 - 10.8 Thousand/uL Final   RBC  Date Value Ref Range Status  02/21/2022 4.38 4.20 - 5.80 Million/uL Final   Hemoglobin  Date Value Ref Range Status  02/21/2022 13.0 (L) 13.2 - 17.1 g/dL Final  09/25/1599 09.3 13.0 - 17.7 g/dL Final   HCT  Date Value Ref Range Status  02/21/2022 39.3 38.5 - 50.0 % Final    Hematocrit  Date Value Ref Range Status  11/12/2019 42.4 37.5 - 51.0 % Final   MCHC  Date Value Ref Range Status  02/21/2022 33.1 32.0 - 36.0 g/dL Final   [facility]  Date Value Ref Range Status  02/21/2022 29.7 27.0 - 33.0 pg Final   MCV  Date Value Ref Range Status  02/21/2022 89.7 80.0 - 100.0 fL Final  11/12/2019 91 79 - 97 fL Final  10/21/2011 90 80 - 100 fL Final   No results found for: "PLTCOUNTKUC", "LABPLAT", "POCPLA" RDW  Date Value Ref Range Status  02/21/2022 14.1 11.0 - 15.0 % Final  11/12/2019 13.8 11.6 - 15.4 % Final  10/21/2011 13.9 11.5 - 14.5 % Final

## 2023-01-30 ENCOUNTER — Other Ambulatory Visit: Payer: Medicare HMO

## 2023-02-01 ENCOUNTER — Other Ambulatory Visit: Payer: Medicare HMO | Admitting: Pharmacist

## 2023-02-01 NOTE — Progress Notes (Signed)
02/01/2023 Name: [name] MRN: [id number] DOB: 1950/12/31  Chief Complaint  Patient presents with   Medication Assistance   Medication Management    [name] is a 72 y.o. year old male who presented for a telephone visit.   They were referred to the pharmacist by their PCP for assistance in managing diabetes, hypertension, hyperlipidemia, and medication access.      Subjective:   Care Team: Primary Care Provider: [name], DO ; Next Scheduled Visit: 03/10/2023 Cardiologist: Debbe Odea, MD Nephrologist: Mosetta Pigeon, MD  ; Next Scheduled Visit: 03/16/2023 Urology: [facility]  Medication Access/Adherence  Current Pharmacy:  [facility] [address] [place] - [address] [place] [address] Phone: [phone] Fax: [phone]  [facility] - [place], [place] - [address] [place] [address] Phone: [phone] Fax: [phone]  [facility] - [place], [place] - [address] [place] [address] Phone: [phone] Fax: [phone]   Patient reports affordability concerns with their medications: No  Patient reports access/transportation concerns to their pharmacy: No  Patient reports adherence concerns with their medications:  No     Patient using weekly pillbox     Diabetes:   Current medications:  Rybelsus 14 mg daily Taking >=30 minutes before the first food, beverage, or other oral medications of the day with <=4 oz of plain water  Insulin glargine ([name]/Lantus) 37 units QHS   Medications tried in the past:  Trulicity (GI intolerance) Metformin (unable to tolerate - GI side effects) Bydureon (difficulty with device/administration) Invokana (felt dehydrated)   Current glucose readings:  - This morning fasting: 159 - Reports recent morning fasting readings ranging  95-175    Attributes variation in readings to when he snacks overnight on cookie, crackers or potato chips   Patient denies hypoglycemic s/sx including dizziness, shakiness, sweating              Current physical activity: reports less active during vacation, but typically stays active throughout the day working and walking some at home   Denies interest in continuous glucose monitoring at this time    Current medication access support:  - Enrolled in patient assistance for for [name] from [place] and Rybelsus from [employer] patient assistance programs through 03/28/2023; Collaborating with CPhT on re-enrollment for both programs for 2025  Reports completed and mailed backed to CPhT last week  Reports has 4 boxes of [name] remaining and >3 month supply of Rybelsus remaining   Hypertension:   Current medications:  Carvedilol 12.5 mg twice daily Lisinopril 40 mg daily  Imdur 30 mg -1/2 tablet (15 mg) daily   Patient has an automated, upper arm home BP cuff Denies checking home blood pressure recently   Denies symptoms of hypotension   Current physical activity: reports typically stays active throughout the day working and walking some at home     Hyperlipidemia/hx of CAD/NSTEMI:   Current lipid lowering medications: Repatha 140 mg every 14 days             Reports tolerating well             Note prior authorization for Repatha approved through 03/28/2023   Previous therapies tried: atorvastatin 40 mg daily & 10 mg daily (unable to tolerate due to myalgia), ezetimibe    Have discussed importance of limiting saturated fats and trans fats in diet  Antiplatelet regimen: Aspirin 81 mg daily Clopidogrel 75 mg daily   Current physical activity: reports less active during vacation, but typically stays active throughout the day working and walking some at home  Current medication access support: Enrolled in [employer] Hypercholesterolemia - Medicare Access grant through  03/08/2023  Objective:  Lab Results  Component Value Date   HGBA1C 7.1 (A) 09/01/2022    Lab Results  Component Value Date   CREATININE 1.46 (H) 02/21/2022   BUN 20 02/21/2022   NA 137 02/21/2022   K 4.6 02/21/2022   CL 103 02/21/2022   CO2 28 02/21/2022    Lab Results  Component Value Date   CHOL 120 12/28/2022   HDL 31 (L) 12/28/2022   LDLCALC 55 12/28/2022   TRIG 204 (H) 12/28/2022   CHOLHDL 3.9 12/28/2022   BP Readings from Last 3 Encounters:  12/05/22 138/76  09/01/22 118/72  07/22/22 (!) 159/82   Pulse Readings from Last 3 Encounters:  12/05/22 70  09/01/22 80  07/22/22 87     Medications Reviewed Today     Reviewed by Manuela Neptune, RPH-CPP (Pharmacist) on 02/01/23 at 2050  Med List Status: <None>   Medication Order Taking? Sig Documenting Provider Last Dose Status Informant  ACCU-CHEK SMARTVIEW test strip [id number]  TEST BLOOD SUGAR TWO TIMES DAILY [name], DO  Active   acetaminophen (TYLENOL) 500 MG tablet [id number]  Take 1,000 mg by mouth every 8 (eight) hours as needed.  [provider]  Active Spouse/Significant Other, Pharmacy Records  allopurinol (ZYLOPRIM) 100 MG tablet [id number]  TAKE 1 TABLET AT BEDTIME [name], DO  Active   aspirin EC 81 MG EC tablet [id number]  Take 1 tablet (81 mg total) by mouth daily. Swallow whole. Alford Highland, MD  Active Spouse/Significant Other, Pharmacy Records  carvedilol (COREG) 12.5 MG tablet [id number]  TAKE 1 TABLET TWICE DAILY [name], DO  Active   cephALEXin (KEFLEX) 250 MG capsule [id number]  Take 1 capsule (250 mg total) by mouth daily.  Patient not taking: Reported on 12/05/2022   [name], DO  Active   clopidogrel (PLAVIX) 75 MG tablet [id number]  Take 1 tablet (75 mg total) by mouth daily. Debbe Odea, MD  Active   fluticasone (FLONASE) 50 MCG/ACT nasal spray [id number]  USE 2 SPRAYS IN EACH NOSTRIL ONCE DAILY - USE FOR 4 TO  6 WEEKS, THEN STOP AND USE SEASONALLY OR AS NEEDED. [name], DO  Active   Insulin Glargine ([name] KWIKPEN) 100 UNIT/ML [id number] Yes DIAL AND INJECT 34 UNITS UNDER THE SKIN ONCE DAILY AT BEDTIME. ( MAX DAILY DOSE IS 34 UNITS)  Patient taking differently: Inject 37 Units into the skin at bedtime.   [name], DO Taking Active   Insulin Pen Needle (DROPLET PEN NEEDLES) 32G X 4 MM MISC [id number]  USE WITH [name] INSULIN INJECTION DAILY AS INSTRUCTED Althea Charon Netta Neat, DO  Active   isosorbide mononitrate (IMDUR) 30 MG 24 hr tablet [id number]  Take 0.5 tablets (15 mg total) by mouth daily. Debbe Odea, MD  Active   lisinopril (ZESTRIL) 40 MG tablet [id number]  TAKE 1 TABLET EVERY DAY Karamalegos, Alexander Shela Commons, DO  Active   nitroGLYCERIN (NITROSTAT) 0.4 MG SL tablet [id number]  Place 1 tablet (0.4 mg total) under the tongue every 5 (five) minutes as needed for chest pain. [name], DO  Active   pantoprazole (PROTONIX) 20 MG tablet [id number]  TAKE 1 TABLET TWICE DAILY BEFORE  MEALS [name], DO  Active   REPATHA SURECLICK 140 MG/ML SOAJ [id number]  Inject 140 mg into the skin every 14 (fourteen) days. [name], DO  Active   RYBELSUS 14 MG TABS [id number] Yes Take 1 tablet (14 mg total) by mouth daily before breakfast. [name], DO Taking Active Spouse/Significant Other, Pharmacy Records           Med Note Ronney Asters, [facility] A   Wed Oct 13, 2021  8:56 AM)                Assessment/Plan:   Diabetes: - Reviewed goal A1c, goal fasting, and goal 2 hour post prandial glucose - Reviewed dietary modifications including: - have well-balanced meals throughout the day while controlling carbohydrate portion sizes and limiting nighttime snacking - Discuss alternative lower- carbohydrate snack ideas - Recommend to monitor home blood sugar, keep log of results and have this record to review during  appointments, but to contact office or CM Pharmacist sooner for readings outside of established parameters or for symptoms - Collaborating with provider, CPhT, and patient to pursue assistance with re-enrollment for for [name] from [place] and Rybelsus from [employer] patient assistance   Hypertension: - Reviewed long term cardiovascular and renal outcomes of uncontrolled blood pressure - Reviewed appropriate blood pressure monitoring technique and reviewed goal blood pressure.  - Encourage dietary changes, including limiting salt/sodium intake and increasing intake of fruit and vegetables - Recommended to check home blood pressure and heart rate and keep a log of the results and to contact office for readings outside of established parameters   Hyperlipidemia: - Encourage patient to continue taking Repatha as directed as tolerating well     Follow Up Plan: Clinical Pharmacist will follow up with patient by telephone again on 03/01/2023 8:30 AM     Estelle Grumbles, PharmD, Patsy [place], CPP Clinical Pharmacist [facility] [phone]

## 2023-02-01 NOTE — Patient Instructions (Signed)
Goals Addressed             This Visit's Progress    Pharmacy - Patient Goals       Our goal A1c is less than 7%. This corresponds with fasting sugars less than 130 and 2 hour after meal sugars less than 180. Please check your fasting blood sugar each morning and keep a record of the results  Check your blood pressure once daily, and any time you have concerning symptoms like headache, chest pain, dizziness, shortness of breath, or vision changes.   Our goal is less than 130/80.  To appropriately check your blood pressure, make sure you do the following:  1) Avoid caffeine, exercise, or tobacco products for 30 minutes before checking. Empty your bladder. 2) Sit with your back supported in a flat-backed chair. Rest your arm on something flat (arm of the chair, table, etc). 3) Sit still with your feet flat on the floor, resting, for at least 5 minutes.  4) Check your blood pressure. Take 1-2 readings.  5) Write down these readings and bring with you to any provider appointments.  Bring your home blood pressure machine with you to a provider's office for accuracy comparison at least once a year.   Make sure you take your blood pressure medications before you come to any office visit, even if you were asked to fast for labs.   Feel free to call me with any questions or concerns. I look forward to our next call  Wallace Cullens, PharmD, [facility] [place] [phone]

## 2023-02-16 ENCOUNTER — Other Ambulatory Visit: Payer: Self-pay | Admitting: Pharmacy Technician

## 2023-02-16 DIAGNOSIS — Z5986 Financial insecurity: Secondary | ICD-10-CM

## 2023-02-16 NOTE — Progress Notes (Signed)
Pharmacy Medication Assistance Program Note    02/16/2023  Patient ID: [name], male   DOB: 07-Jul-1950, 72 y.o.   MRN: [id number]     02/16/2023  Outreach Medication One  Manufacturer Medication One [employer] Rybelsus  Dose of Rybelsus 14mg   Type of Radiographer, therapeutic Assistance  Date Application Received From Patient 02/01/2023  Application Items Received From Patient Proof of Income;Application;Other  Date Application Received From Provider 01/20/2023  Date Application Submitted to Manufacturer 02/15/2023  Method Application Sent to Manufacturer Fax       Signature   Kristopher Glee [facility]  Office: [phone] Fax: [phone] Email: [email or web address]

## 2023-02-17 ENCOUNTER — Other Ambulatory Visit: Payer: Self-pay | Admitting: Pharmacy Technician

## 2023-02-17 DIAGNOSIS — Z5986 Financial insecurity: Secondary | ICD-10-CM

## 2023-02-17 NOTE — Progress Notes (Signed)
Pharmacy Medication Assistance Program Note    02/17/2023  Patient ID: [name], male  DOB: [birth year], 72 y.o.  MRN:  [id number]     02/17/2023  Outreach Medication Two  Manufacturer Medication Two [name] [facility] [name]  Dose of [name] 100units/ml  Type of Radiographer, therapeutic Assistance  Date Application Received From Patient 02/01/2023  Application Items Received From Patient Application;Proof of Income;Other  Date Application Received From Provider 02/16/2023  Method Application Sent to Manufacturer Fax  Date Application Submitted to Manufacturer 02/16/2023       Signature   Kristopher Glee [place]  Office: [phone] Fax: [phone] Email: [email or web address]

## 2023-03-01 ENCOUNTER — Encounter: Payer: Self-pay | Admitting: Pharmacist

## 2023-03-01 ENCOUNTER — Other Ambulatory Visit: Payer: Medicare HMO | Admitting: Pharmacist

## 2023-03-01 NOTE — Patient Instructions (Signed)
Goals Addressed             This Visit's Progress    Pharmacy - Patient Goals       You were pre-approved for re-enrollment in the [facility] Hypercholesterolemia - Medicare Access grant.    You can contact [employer] at:   [email or web address] [phone]   Kennedy Bucker Information:   Enrollment Period: 03/09/2023 through 03/07/2024 Kennedy Bucker Amount: $2,500.00 [employer] Identification Number: [id number]   Pharmacy Billing Info:   ID: [id number] Rx BIN: [id number] Rx PCN: [id number] Group: [id number] Pharmacy Help Desk: [phone]   Please reach out to [facility] for further questions about this grant. You can give me or your provider a call for further questions about your medications.  Our goal A1c is less than 7%. This corresponds with fasting sugars less than 130 and 2 hour after meal sugars less than 180. Please check your fasting blood sugar each morning and keep a record of the results  Check your blood pressure once daily, and any time you have concerning symptoms like headache, chest pain, dizziness, shortness of breath, or vision changes.   Our goal is less than 130/80.  To appropriately check your blood pressure, make sure you do the following:  1) Avoid caffeine, exercise, or tobacco products for 30 minutes before checking. Empty your bladder. 2) Sit with your back supported in a flat-backed chair. Rest your arm on something flat (arm of the chair, table, etc). 3) Sit still with your feet flat on the floor, resting, for at least 5 minutes.  4) Check your blood pressure. Take 1-2 readings.  5) Write down these readings and bring with you to any provider appointments.  Bring your home blood pressure machine with you to a provider's office for accuracy comparison at least once a year.   Make sure you take your blood pressure medications before you come to any office visit, even if you were asked to fast for labs.  Feel  free to call me with any questions or concerns. I look forward to our next call  Estelle Grumbles, PharmD, [facility] Clinical Pharmacist [facility] [phone]

## 2023-03-01 NOTE — Progress Notes (Signed)
   03/01/2023  Patient ID: [name], male   DOB: Mar 22, 1951, 71 y.o.   MRN: [id number]  Outreach to patient today by telephone as scheduled to complete re-enrollment in [employer] Hypercholesterolemia - Medicare Access grant.  Assist patient with enrolling in [employer] Foundation Hypercholesteremia- Medicare Access grant - patient approved through 03/07/2024 Send patient [employer] grant billing information via MyChart  Patient aware to provide to pharmacy and advise pharmacy to bill remaining balance of cost for Repatha prescription to [facility] grant ONLY AFTER billed to Medicare  From review of chart, note CPhT Noreene Larsson Simcox faxed patient's application for re-enrollment in [name] patient assistance to program on 02/16/2023 and for re-enrollment in [employer] patient assistance program on 02/15/2023   Today patient/spouse state that they received a letter from [employer] stating that he was approved for re-enrollment for Rybelsus through 03/27/2024 - Will provide this update to CPhT  Follow Up Plan: Clinical Pharmacist will follow up with patient by telephone again on 04/21/2023 at 8:30 AM      Estelle Grumbles, PharmD, Patsy [place], CPP Clinical Pharmacist [facility] [phone]

## 2023-03-02 ENCOUNTER — Other Ambulatory Visit: Payer: Medicare HMO

## 2023-03-02 DIAGNOSIS — Z794 Long term (current) use of insulin: Secondary | ICD-10-CM

## 2023-03-02 DIAGNOSIS — R972 Elevated prostate specific antigen [PSA]: Secondary | ICD-10-CM | POA: Diagnosis not present

## 2023-03-02 DIAGNOSIS — I2510 Atherosclerotic heart disease of native coronary artery without angina pectoris: Secondary | ICD-10-CM

## 2023-03-02 DIAGNOSIS — Z0000 Encounter for general adult medical examination without abnormal findings: Secondary | ICD-10-CM

## 2023-03-02 DIAGNOSIS — E1169 Type 2 diabetes mellitus with other specified complication: Secondary | ICD-10-CM | POA: Diagnosis not present

## 2023-03-02 DIAGNOSIS — M1000 Idiopathic gout, unspecified site: Secondary | ICD-10-CM | POA: Diagnosis not present

## 2023-03-02 DIAGNOSIS — I129 Hypertensive chronic kidney disease with stage 1 through stage 4 chronic kidney disease, or unspecified chronic kidney disease: Secondary | ICD-10-CM | POA: Diagnosis not present

## 2023-03-02 DIAGNOSIS — N183 Chronic kidney disease, stage 3 unspecified: Secondary | ICD-10-CM | POA: Diagnosis not present

## 2023-03-02 DIAGNOSIS — E785 Hyperlipidemia, unspecified: Secondary | ICD-10-CM | POA: Diagnosis not present

## 2023-03-03 ENCOUNTER — Other Ambulatory Visit: Payer: Medicare HMO

## 2023-03-03 LAB — COMPLETE METABOLIC PANEL WITH GFR
AG Ratio: 1.3 (calc) (ref 1.0–2.5)
ALT: 13 U/L (ref 9–46)
AST: 14 U/L (ref 10–35)
Albumin: 4 g/dL (ref 3.6–5.1)
Alkaline phosphatase (APISO): 64 U/L (ref 35–144)
BUN/Creatinine Ratio: 13 (calc) (ref 6–22)
BUN: 19 mg/dL (ref 7–25)
CO2: 29 mmol/L (ref 20–32)
Calcium: 9.4 mg/dL (ref 8.6–10.3)
Chloride: 102 mmol/L (ref 98–110)
Creat: 1.49 mg/dL — ABNORMAL HIGH (ref 0.70–1.28)
Globulin: 3 g/dL (ref 1.9–3.7)
Glucose, Bld: 185 mg/dL — ABNORMAL HIGH (ref 65–99)
Potassium: 4.6 mmol/L (ref 3.5–5.3)
Sodium: 138 mmol/L (ref 135–146)
Total Bilirubin: 0.5 mg/dL (ref 0.2–1.2)
Total Protein: 7 g/dL (ref 6.1–8.1)
eGFR: 50 mL/min/{1.73_m2} — ABNORMAL LOW (ref >=60)

## 2023-03-03 LAB — CBC WITH DIFFERENTIAL/PLATELET
Absolute Lymphocytes: 1604 {cells}/uL (ref 850–3900)
Absolute Monocytes: 390 {cells}/uL (ref 200–950)
Basophils Absolute: 43 {cells}/uL (ref 0–200)
Basophils Relative: 0.7 %
Eosinophils Absolute: 220 {cells}/uL (ref 15–500)
Eosinophils Relative: 3.6 %
HCT: 40.7 % (ref 38.5–50.0)
Hemoglobin: 13.1 g/dL — ABNORMAL LOW (ref 13.2–17.1)
MCH: 28.9 pg (ref 27.0–33.0)
MCHC: 32.2 g/dL (ref 32.0–36.0)
MCV: 89.8 fL (ref 80.0–100.0)
MPV: 10.1 fL (ref 7.5–12.5)
Monocytes Relative: 6.4 %
Neutro Abs: 3843 {cells}/uL (ref 1500–7800)
Neutrophils Relative %: 63 %
Platelets: 271 10*3/uL (ref 140–400)
RBC: 4.53 Million/uL (ref 4.20–5.80)
RDW: 13.7 % (ref 11.0–15.0)
Total Lymphocyte: 26.3 %
WBC: 6.1 10*3/uL (ref 3.8–10.8)

## 2023-03-03 LAB — PSA: PSA: 10.44 ng/mL — ABNORMAL HIGH (ref <=4.00)

## 2023-03-03 LAB — TSH: TSH: 3.9 m[IU]/L (ref 0.40–4.50)

## 2023-03-03 LAB — LIPID PANEL
Cholesterol: 116 mg/dL
HDL: 32 mg/dL — ABNORMAL LOW
LDL Cholesterol (Calc): 57 mg/dL
Non-HDL Cholesterol (Calc): 84 mg/dL
Total CHOL/HDL Ratio: 3.6 (calc)
Triglycerides: 196 mg/dL — ABNORMAL HIGH

## 2023-03-03 LAB — MICROALBUMIN / CREATININE URINE RATIO
Creatinine, Urine: 128 mg/dL (ref 20–320)
Microalb Creat Ratio: 51 mg/g{creat} — ABNORMAL HIGH
Microalb, Ur: 6.5 mg/dL

## 2023-03-03 LAB — HEMOGLOBIN A1C
Hgb A1c MFr Bld: 8.3 %{Hb} — ABNORMAL HIGH
Mean Plasma Glucose: 192 mg/dL
eAG (mmol/L): 10.6 mmol/L

## 2023-03-03 LAB — URIC ACID: Uric Acid, Serum: 5.5 mg/dL (ref 4.0–8.0)

## 2023-03-06 DIAGNOSIS — D631 Anemia in chronic kidney disease: Secondary | ICD-10-CM | POA: Diagnosis not present

## 2023-03-06 DIAGNOSIS — N1831 Chronic kidney disease, stage 3a: Secondary | ICD-10-CM | POA: Diagnosis not present

## 2023-03-06 DIAGNOSIS — I10 Essential (primary) hypertension: Secondary | ICD-10-CM | POA: Diagnosis not present

## 2023-03-06 DIAGNOSIS — E1122 Type 2 diabetes mellitus with diabetic chronic kidney disease: Secondary | ICD-10-CM | POA: Diagnosis not present

## 2023-03-07 ENCOUNTER — Other Ambulatory Visit: Payer: Self-pay | Admitting: Family Medicine

## 2023-03-07 DIAGNOSIS — Z794 Long term (current) use of insulin: Secondary | ICD-10-CM

## 2023-03-08 NOTE — Telephone Encounter (Signed)
Requested Prescriptions  Pending Prescriptions Disp Refills   Insulin Glargine ([name] KWIKPEN) 100 UNIT/ML [Pharmacy Med Name: [name] KwikPen Subcutaneous Solution Pen-injector 100 UNIT/ML] 45 mL 0    Sig: DIAL AND INJECT 34 UNITS UNDER THE SKIN DAILY AT BEDTIME. MAX DAILY DOSE IS 34 UNITS.     Endocrinology:  Diabetes - Insulins Failed - 03/07/2023  8:00 AM      Failed - HBA1C is between 0 and 7.9 and within 180 days    Hemoglobin A1C  Date Value Ref Range Status  01/11/2019 7.8  Final   Hgb A1c MFr Bld  Date Value Ref Range Status  03/02/2023 8.3 (H) <5.7 % of total Hgb Final    Comment:    For someone without known diabetes, a hemoglobin A1c value of 6.5% or greater indicates that they may have  diabetes and this should be confirmed with a follow-up  test. . For someone with known diabetes, a value <7% indicates  that their diabetes is well controlled and a value  greater than or equal to 7% indicates suboptimal  control. A1c targets should be individualized based on  duration of diabetes, age, comorbid conditions, and  other considerations. . Currently, no consensus exists regarding use of hemoglobin A1c for diagnosis of diabetes for children. .          Failed - Valid encounter within last 6 months    Recent Outpatient Visits           2 months ago Type 2 diabetes mellitus with other specified complication, with long-term current use of insulin [facility])   [place] [facility] Delles, Gentry Fitz A, RPH-CPP   3 months ago Type 2 diabetes mellitus with other specified complication, with long-term current use of insulin (HCC)   [place] [facility] [facility] Delles, Gentry Fitz A, RPH-CPP   5 months ago Type 2 diabetes mellitus with other specified complication, with long-term current use of insulin [facility])   [place] [facility] Delles, Gentry Fitz A, RPH-CPP   6 months ago Type 2 diabetes mellitus with other specified  complication, with long-term current use of insulin [facility])   [place] [facility] [place], Netta Neat, DO   7 months ago Upper respiratory tract infection, unspecified type   [place] [facility] Mecum, Oswaldo Conroy, PA-C       Future Appointments             In 2 days Althea Charon, Netta Neat, DO [facility], [facility]

## 2023-03-10 ENCOUNTER — Ambulatory Visit (INDEPENDENT_AMBULATORY_CARE_PROVIDER_SITE_OTHER): Payer: Medicare HMO | Admitting: Family Medicine

## 2023-03-10 ENCOUNTER — Ambulatory Visit (INDEPENDENT_AMBULATORY_CARE_PROVIDER_SITE_OTHER): Payer: Medicare HMO

## 2023-03-10 ENCOUNTER — Other Ambulatory Visit: Payer: Self-pay | Admitting: Family Medicine

## 2023-03-10 ENCOUNTER — Encounter: Payer: Self-pay | Admitting: Family Medicine

## 2023-03-10 VITALS — BP 122/74 | HR 82 | Ht 71.0 in | Wt 231.6 lb

## 2023-03-10 DIAGNOSIS — Z9861 Coronary angioplasty status: Secondary | ICD-10-CM

## 2023-03-10 DIAGNOSIS — E785 Hyperlipidemia, unspecified: Secondary | ICD-10-CM | POA: Diagnosis not present

## 2023-03-10 DIAGNOSIS — R972 Elevated prostate specific antigen [PSA]: Secondary | ICD-10-CM

## 2023-03-10 DIAGNOSIS — Z794 Long term (current) use of insulin: Secondary | ICD-10-CM

## 2023-03-10 DIAGNOSIS — I2510 Atherosclerotic heart disease of native coronary artery without angina pectoris: Secondary | ICD-10-CM | POA: Diagnosis not present

## 2023-03-10 DIAGNOSIS — N183 Chronic kidney disease, stage 3 unspecified: Secondary | ICD-10-CM | POA: Diagnosis not present

## 2023-03-10 DIAGNOSIS — Z0000 Encounter for general adult medical examination without abnormal findings: Secondary | ICD-10-CM

## 2023-03-10 DIAGNOSIS — M1000 Idiopathic gout, unspecified site: Secondary | ICD-10-CM | POA: Diagnosis not present

## 2023-03-10 DIAGNOSIS — I129 Hypertensive chronic kidney disease with stage 1 through stage 4 chronic kidney disease, or unspecified chronic kidney disease: Secondary | ICD-10-CM | POA: Diagnosis not present

## 2023-03-10 DIAGNOSIS — E1169 Type 2 diabetes mellitus with other specified complication: Secondary | ICD-10-CM

## 2023-03-10 NOTE — Progress Notes (Signed)
Subjective:    Patient ID: [name], male    DOB: 01-13-1951, 72 y.o.   MRN: [id number]  [name] is a 72 y.o. male presenting on 03/10/2023 for Annual Exam   HPI  Discussed the use of AI scribe software for clinical note transcription with the patient, who gave verbal consent to proceed.     CHRONIC DM, Type 2 with nephropathy / CKD-III A1c up to 8.3 Previously in 7 range Admits poor diet during holidays His CBG readings avg 120-140 Meds: - On Rybelsus 14 mg - On [name] 37 units nightly  - Remains off Metformin Reports  good compliance. Tolerating well w/o side-effects Currently on ACEi Lifestyle: - Diet (improved DM Diet, except now still increased sweets) - Exercise limited Due for DM Eye exam. Denies hypoglycemia, polyuria, visual changes, numbness or tingling.   HYPERLIPIDEMIA Drug Induced Myopathy Off Statin therapy failed due to myopathy On Repatha PCSK9   CAD History of MI   Hypertension Controlled on current medication.   Elevated PSA Recurrent UTI Followed by Urology in [place] Prior PSA >20, has improved to 7 now. Previously had Prostate Biopsy 2021 negative but caused sepsis infection On D-Mannose Cranberry, probiotic to reduce UTI  He was on Keflex 250mg  daily, he is off of this for past 1 month. No further UTI. He had side effect with GI intolerance.  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      03/10/2023    9:02 AM 09/01/2022   10:26 AM 03/04/2022   10:23 AM  Depression screen PHQ 2/9  Decreased Interest 0 0 0  Down, Depressed, Hopeless 1 0 0  PHQ - 2 Score 1 0 0  Altered sleeping 0 0 0  Tired, decreased energy 1 0 0  Change in appetite 0 0 0  Feeling bad or failure about yourself  0 0 0  Trouble concentrating 0 0 0  Moving slowly or fidgety/restless 0 0 0  Suicidal thoughts 0 0 0  PHQ-9 Score 2 0 0  Difficult doing  work/chores Not difficult at all Not difficult at all Not difficult at all       09/01/2022   10:26 AM 03/02/2022   10:34 AM 09/16/2021   10:28 AM 11/10/2020    2:14 PM  GAD 7 : Generalized Anxiety Score  Nervous, Anxious, on Edge 0 0 0 0  Control/stop worrying 0 0 0 0  Worry too much - different things 0 0 0 0  Trouble relaxing 0 0 0 0  Restless 0 0 0 0  Easily annoyed or irritable 0 0 0 0  Afraid - awful might happen 0 0 0 0  Total GAD 7 Score 0 0 0 0  Anxiety Difficulty Not difficult at all Not difficult at all Not difficult at all Not difficult at all     Past Medical History:  Diagnosis Date   Allergy    CAD (coronary artery disease)    a. 01/2019 MV: EF 55-65%, no ischemia; b. 12/2019 NSTEMI/PCI: LM nl, LAD 40p, 44m, D1 40, LCX nl, OM3 40, RCA 90/85p (2.75x34 Resolute Onyx DES), 27m/d.   Cataract 2023   removed in 2023   Chronic headaches    CKD (chronic kidney disease), stage III (HCC)    Edentulous    no lower teeth   GERD (gastroesophageal reflux disease)    Gout    Heart burn  History of echocardiogram    a. 12/2019 Echo: EF 60-65%, no rwma, triv MR.   Hyperlipidemia    Hypertension    Motion sickness    boats   NSTEMI (non-ST elevated myocardial infarction) (HCC) 12/27/2019   Pulmonary embolism (HCC) 05/2017   Type 2 diabetes mellitus [facility])    Past Surgical History:  Procedure Laterality Date   BACK SURGERY  02/26/2019   CATARACT EXTRACTION W/PHACO Left 04/14/2021   Procedure: CATARACT EXTRACTION PHACO AND INTRAOCULAR LENS PLACEMENT (IOC) LEFT DIABETIC maylugin;  Surgeon: Lockie Mola, MD;  Location: [facility];  Service: Ophthalmology;  Laterality: Left;  11.22 1:25.1   CATARACT EXTRACTION W/PHACO Right 06/09/2021   Procedure: CATARACT EXTRACTION PHACO AND INTRAOCULAR LENS PLACEMENT (IOC) RIGHT DIABETIC MALYUGIN;  Surgeon: Lockie Mola, MD;  Location: [facility];  Service: Ophthalmology;  Laterality: Right;   Diabetic 4.75 01:05.7   CORONARY STENT INTERVENTION N/A 10/26/2021   Procedure: CORONARY STENT INTERVENTION;  Surgeon: Yvonne Kendall, MD;  Location: [facility] INVASIVE CV LAB;  Service: Cardiovascular;  Laterality: N/A;   CORONARY ULTRASOUND/IVUS N/A 10/26/2021   Procedure: Intravascular Ultrasound/IVUS;  Surgeon: Yvonne Kendall, MD;  Location: [facility] INVASIVE CV LAB;  Service: Cardiovascular;  Laterality: N/A;   EYE SURGERY  see above comment   KNEE SURGERY     LEFT HEART CATH AND CORONARY ANGIOGRAPHY N/A 12/30/2019   Procedure: LEFT HEART CATH AND CORONARY ANGIOGRAPHY;  Surgeon: Iran Ouch, MD;  Location: [facility] INVAS[facility] CV LAB;  Service: Cardiovascular;  Laterality: N/A;   LEFT HEART CATH AND CORONARY ANGIOGRAPHY N/A 10/26/2021   Procedure: LEFT HEART CATH AND CORONARY ANGIOGRAPHY;  Surgeon: Yvonne Kendall, MD;  Location: [facility] INVASIVE CV LAB;  Service: Cardiovascular;  Laterality: N/A;   SHOULDER SURGERY     SPINE SURGERY  02/26/2019   TONSILLECTOMY     Social History   Socioeconomic History   Marital status: Married    Spouse name: Not on file   Number of children: Not on file   Years of education: Not on file   Highest education level: 9th grade  Occupational History   Occupation: retired  Tobacco Use   Smoking status: Former    Current packs/day: 0.00    Types: Cigars, Cigarettes    Quit date: 03/28/1998    Years since quitting: 24.9   Smokeless tobacco: Current    Types: Chew   Tobacco comments:    wife states patient never smoked cigarettes, just chewing tobacco  Vaping Use   Vaping status: Never Used  Substance and Sexual Activity   Alcohol use: Not Currently   Drug use: No   Sexual activity: Yes    Birth control/protection: None  Other Topics Concern   Not on file  Social History Narrative   Not on file   Social Drivers of Health   Financial Resource Strain: Low Risk  (03/10/2023)   Overall Financial Resource Strain (CARDIA)    Difficulty of Paying  Living Expenses: Not hard at all  Food Insecurity: No Food Insecurity (03/10/2023)   Hunger Vital Sign    Worried About Running Out of Food in the Last Year: Never true    Ran Out of Food in the Last Year: Never true  Transportation Needs: No Transportation Needs (03/10/2023)   PRAPARE - Administrator, Civil Service (Medical): No    Lack of Transportation (Non-Medical): No  Physical Activity: Sufficiently Active (03/10/2023)   Exercise Vital Sign    Days of Exercise per Week: 5  days    Minutes of Exercise per Session: 60 min  Stress: No Stress Concern Present (03/10/2023)   [employer] of Occupational Health - Occupational Stress Questionnaire    Feeling of Stress : Not at all  Social Connections: Moderately Isolated (03/10/2023)   Social Connection and Isolation Panel [NHANES]    Frequency of Communication with Friends and Family: More than three times a week    Frequency of Social Gatherings with Friends and Family: More than three times a week    Attends Religious Services: Never    Database administrator or Organizations: No    Attends Banker Meetings: Never    Marital Status: Married  Catering manager Violence: Not At Risk (03/10/2023)   Humiliation, Afraid, Rape, and Kick questionnaire    Fear of Current or Ex-Partner: No    Emotionally Abused: No    Physically Abused: No    Sexually Abused: No   Family History  Problem Relation Age of Onset   Cancer Father        lung   Heart disease Sister    Heart disease Brother    Cancer Brother    Heart disease Brother    Prostate cancer Neg Hx    Current Outpatient Medications on File Prior to Visit  Medication Sig   ACCU-CHEK SMARTVIEW test strip TEST BLOOD SUGAR TWO TIMES DAILY   acetaminophen (TYLENOL) 500 MG tablet Take 1,000 mg by mouth every 8 (eight) hours as needed.    allopurinol (ZYLOPRIM) 100 MG tablet TAKE 1 TABLET AT BEDTIME   aspirin EC 81 MG EC tablet Take 1 tablet (81 mg total)  by mouth daily. Swallow whole.   carvedilol (COREG) 12.5 MG tablet TAKE 1 TABLET TWICE DAILY   clopidogrel (PLAVIX) 75 MG tablet Take 1 tablet (75 mg total) by mouth daily.   fluticasone (FLONASE) 50 MCG/ACT nasal spray USE 2 SPRAYS IN EACH NOSTRIL ONCE DAILY - USE FOR 4 TO 6 WEEKS, THEN STOP AND USE SEASONALLY OR AS NEEDED.   Insulin Glargine ([name] KWIKPEN) 100 UNIT/ML DIAL AND INJECT 34 UNITS UNDER THE SKIN DAILY AT BEDTIME. MAX DAILY DOSE IS 34 UNITS.   Insulin Pen Needle (DROPLET PEN NEEDLES) 32G X 4 MM MISC USE WITH [name] INSULIN INJECTION DAILY AS INSTRUCTED   isosorbide mononitrate (IMDUR) 30 MG 24 hr tablet Take 0.5 tablets (15 mg total) by mouth daily.   lisinopril (ZESTRIL) 40 MG tablet TAKE 1 TABLET EVERY DAY   nitroGLYCERIN (NITROSTAT) 0.4 MG SL tablet Place 1 tablet (0.4 mg total) under the tongue every 5 (five) minutes as needed for chest pain.   pantoprazole (PROTONIX) 20 MG tablet TAKE 1 TABLET TWICE DAILY BEFORE MEALS   REPATHA SURECLICK 140 MG/ML SOAJ Inject 140 mg into the skin every 14 (fourteen) days.   RYBELSUS 14 MG TABS Take 1 tablet (14 mg total) by mouth daily before breakfast.   No current facility-administered medications on file prior to visit.    Review of Systems  Constitutional:  Negative for activity change, appetite change, chills, diaphoresis, fatigue and fever.  HENT:  Negative for congestion and hearing loss.   Eyes:  Negative for visual disturbance.  Respiratory:  Negative for cough, chest tightness, shortness of breath and wheezing.   Cardiovascular:  Negative for chest pain, palpitations and leg swelling.  Gastrointestinal:  Negative for abdominal pain, constipation, diarrhea, nausea and vomiting.  Genitourinary:  Negative for dysuria, frequency and hematuria.  Musculoskeletal:  Negative for arthralgias and  neck pain.  Skin:  Negative for rash.  Neurological:  Negative for dizziness, weakness, light-headedness, numbness and headaches.   Hematological:  Negative for adenopathy.  Psychiatric/Behavioral:  Negative for behavioral problems, dysphoric mood and sleep disturbance.    Per HPI unless specifically indicated above     Objective:    BP 122/74 (BP Location: Left Arm, Patient Position: Sitting, Cuff Size: Large)   Pulse 82   Ht 5\' 11"  (1.803 m)   Wt 231 lb 9.6 oz (105.1 kg)   SpO2 98%   BMI 32.30 kg/m   Wt Readings from Last 3 Encounters:  03/10/23 231 lb 9.6 oz (105.1 kg)  12/05/22 226 lb (102.5 kg)  09/01/22 223 lb (101.2 kg)    Physical Exam Vitals and nursing note reviewed.  Constitutional:      General: He is not in acute distress.    Appearance: He is well-developed. He is not diaphoretic.     Comments: Well-appearing, comfortable, cooperative  HENT:     Head: Normocephalic and atraumatic.  Eyes:     General:        Right eye: No discharge.        Left eye: No discharge.     Conjunctiva/sclera: Conjunctivae normal.     Pupils: Pupils are equal, round, and reactive to light.  Neck:     Thyroid: No thyromegaly.     Vascular: No carotid bruit.  Cardiovascular:     Rate and Rhythm: Normal rate and regular rhythm.     Pulses: Normal pulses.     Heart sounds: Normal heart sounds. No murmur heard. Pulmonary:     Effort: Pulmonary effort is normal. No respiratory distress.     Breath sounds: Normal breath sounds. No wheezing or rales.  Abdominal:     General: Bowel sounds are normal. There is no distension.     Palpations: Abdomen is soft. There is no mass.     Tenderness: There is no abdominal tenderness.  Musculoskeletal:        General: No tenderness. Normal range of motion.     Cervical back: Normal range of motion and neck supple.     Right lower leg: No edema.     Left lower leg: No edema.     Comments: Upper / Lower Extremities: - Normal muscle tone, strength bilateral upper extremities 5/5, lower extremities 5/5  Lymphadenopathy:     Cervical: No cervical adenopathy.  Skin:     General: Skin is warm and dry.     Findings: No erythema or rash.  Neurological:     Mental Status: He is alert and oriented to person, place, and time.     Comments: Distal sensation intact to light touch all extremities  Psychiatric:        Mood and Affect: Mood normal.        Behavior: Behavior normal.        Thought Content: Thought content normal.     Comments: Well groomed, good eye contact, normal speech and thoughts     Diabetic Foot Exam - Simple   Simple Foot Form Diabetic Foot exam was performed with the following findings: Yes 03/10/2023  2:03 PM  Visual Inspection No deformities, no ulcerations, no other skin breakdown bilaterally: Yes Sensation Testing Intact to touch and monofilament testing bilaterally: Yes Pulse Check Posterior Tibialis and Dorsalis pulse intact bilaterally: Yes Comments      Results for orders placed or performed in visit on 03/02/23  Urine Microalbumin w/creat.  ratio   Collection Time: 03/02/23  8:24 AM  Result Value Ref Range   Creatinine, Urine 128 20 - 320 mg/dL   Microalb, Ur 6.5 mg/dL   Microalb Creat Ratio 51 (H) <30 mg/g creat  Uric acid   Collection Time: 03/02/23  8:24 AM  Result Value Ref Range   Uric Acid, Serum 5.5 4.0 - 8.0 mg/dL  TSH   Collection Time: 03/02/23  8:24 AM  Result Value Ref Range   TSH 3.90 0.40 - 4.50 mIU/L  PSA   Collection Time: 03/02/23  8:24 AM  Result Value Ref Range   PSA 10.44 (H) < OR = 4.00 ng/mL  Hemoglobin A1c   Collection Time: 03/02/23  8:24 AM  Result Value Ref Range   Hgb A1c MFr Bld 8.3 (H) <5.7 % of total Hgb   Mean Plasma Glucose 192 mg/dL   eAG (mmol/L) 16.1 mmol/L  Lipid panel   Collection Time: 03/02/23  8:24 AM  Result Value Ref Range   Cholesterol 116 <200 mg/dL   HDL 32 (L) > OR = 40 mg/dL   Triglycerides 096 (H) <150 mg/dL   LDL Cholesterol (Calc) 57 mg/dL (calc)   Total CHOL/HDL Ratio 3.6 <5.0 (calc)   Non-HDL Cholesterol (Calc) 84 <045 mg/dL (calc)  CBC with  Differential/Platelet   Collection Time: 03/02/23  8:24 AM  Result Value Ref Range   WBC 6.1 3.8 - 10.8 Thousand/uL   RBC 4.53 4.20 - 5.80 Million/uL   Hemoglobin 13.1 (L) 13.2 - 17.1 g/dL   HCT 40.9 81.1 - 91.4 %   MCV 89.8 80.0 - 100.0 fL   MCH 28.9 27.0 - 33.0 pg   MCHC 32.2 32.0 - 36.0 g/dL   RDW 78.2 95.6 - 21.3 %   Platelets 271 140 - 400 Thousand/uL   MPV 10.1 7.5 - 12.5 fL   Neutro Abs 3,843 1,500 - 7,800 cells/uL   Absolute Lymphocytes 1,604 850 - 3,900 cells/uL   Absolute Monocytes 390 200 - 950 cells/uL   Eosinophils Absolute 220 15 - 500 cells/uL   Basophils Absolute 43 0 - 200 cells/uL   Neutrophils Relative % 63 %   Total Lymphocyte 26.3 %   Monocytes Relative 6.4 %   Eosinophils Relative 3.6 %   Basophils Relative 0.7 %  COMPLETE METABOLIC PANEL WITH GFR   Collection Time: 03/02/23  8:24 AM  Result Value Ref Range   Glucose, Bld 185 (H) 65 - 99 mg/dL   BUN 19 7 - 25 mg/dL   Creat 0.86 (H) 5.78 - 1.28 mg/dL   eGFR 50 (L) > OR = 60 mL/min/1.50m2   BUN/Creatinine Ratio 13 6 - 22 (calc)   Sodium 138 135 - 146 mmol/L   Potassium 4.6 3.5 - 5.3 mmol/L   Chloride 102 98 - 110 mmol/L   CO2 29 20 - 32 mmol/L   Calcium 9.4 8.6 - 10.3 mg/dL   Total Protein 7.0 6.1 - 8.1 g/dL   Albumin 4.0 3.6 - 5.1 g/dL   Globulin 3.0 1.9 - 3.7 g/dL (calc)   AG Ratio 1.3 1.0 - 2.5 (calc)   Total Bilirubin 0.5 0.2 - 1.2 mg/dL   Alkaline phosphatase (APISO) 64 35 - 144 U/L   AST 14 10 - 35 U/L   ALT 13 9 - 46 U/L      Assessment & Plan:   Problem List Items Addressed This Visit     CAD S/P percutaneous coronary angioplasty   Elevated PSA, between 10  and less than 20 ng/ml   Gout   Type 2 diabetes mellitus with other specified complication (HCC)   Other Visit Diagnoses       Annual physical exam    -  Primary     Benign hypertension with CKD (chronic kidney disease) stage III (HCC)         Hyperlipidemia associated with type 2 diabetes mellitus [facility])            Updated  Health Maintenance information Reviewed recent lab results with patient Encouraged improvement to lifestyle with diet and exercise Goal of weight loss   Chronic Back Pain Scheduled for radiofrequency ablation in one week. Discussed the procedure's goal of pain management and potential temporary impact on mobility. - Proceed with radiofrequency ablation as planned.  Elevated Prostate-Specific Antigen (PSA) Current level 10.4, down from previous 26. No current urinary symptoms. Patient had a complicated history with prostate biopsy and prefers not to repeat the procedure. - Monitor PSA level, repeat test in six months. If PSA remains elevated (>10), consider referral back to urology in [facility]  Type 2 Diabetes Mellitus Recent HbA1c 8.3, indicating suboptimal glycemic control. Patient acknowledges dietary non-compliance. - Continue current medication (Rybelsus). - Encourage dietary improvement. - Repeat HbA1c and PSA in six months.  Peripheral Neuropathy Reports of burning and aching in feet, likely related to diabetic neuropathy. - Continue current management, monitor symptoms.  General Health Maintenance - Pneumonia vaccination due, patient declined. - Colon cancer screening (Cologuard) done in June 2023, negative. Next due in 2026. - Eye checkup done in October 2024. - Kidney function stable, continue monitoring by nephrologist. - Follow-up in six months for diabetes and PSA check.         No orders of the defined types were placed in this encounter.   No orders of the defined types were placed in this encounter.    Follow up plan: Return in about 6 months (around 09/08/2023) for 6 month fasting lab then 1 week later Follow-up Diabetes, PSA.  Future labs 09/08/23 A1c PSA   Saralyn Pilar, DO [facility] 03/10/2023, 1:45 PM

## 2023-03-10 NOTE — Patient Instructions (Addendum)
Thank you for coming to the office today.  PSA up to 10 range. This is still much better than previous 26 range.  It is okay to remain off Keflex long term antibiotic for now, if start to get UTI we will need to return to [facility]  Repeat PSA in 6 months, if still elevated >10 we can return to Urology in [place].  Keep on current medication Rybelsus  Goal to improve diet and we can repeat sugar and PSA in 6 months.  DUE for FASTING BLOOD WORK (no food or drink after midnight before the lab appointment, only water or coffee without cream/sugar on the morning of)  SCHEDULE "Lab Only" visit in the morning at the clinic for lab draw in 6 MONTHS   - Make sure Lab Only appointment is at about 1 week before your next appointment, so that results will be available  For Lab Results, once available within 2-3 days of blood draw, you can can log in to MyChart online to view your results and a brief explanation. Also, we can discuss results at next follow-up visit.   Please schedule a Follow-up Appointment to: Return in about 6 months (around 09/08/2023) for 6 month fasting lab then 1 week later Follow-up Diabetes, PSA.  If you have any other questions or concerns, please feel free to call the office or send a message through MyChart. You may also schedule an earlier appointment if necessary.  Additionally, you may be receiving a survey about your experience at our office within a few days to 1 week by e-mail or mail. We value your feedback.  Saralyn Pilar, DO [facility], [place]

## 2023-03-10 NOTE — Progress Notes (Signed)
Subjective:   [name] is a 72 y.o. male who presents for Medicare Annual/Subsequent preventive examination.  Visit Complete: Virtual I connected with  [name] on 03/10/23 by a audio enabled telemedicine application and verified that I am speaking with the correct person using two identifiers.  Patient Location: Home  Provider Location: Office/Clinic  I discussed the limitations of evaluation and management by telemedicine. The patient expressed understanding and agreed to proceed.  Vital Signs: Because this visit was a virtual/telehealth visit, some criteria may be missing or patient reported. Any vitals not documented were not able to be obtained and vitals that have been documented are patient reported.  Cardiac Risk Factors include: advanced age (>2men, >68 women);diabetes mellitus;dyslipidemia;male gender;hypertension;obesity (BMI >30kg/m2)     Objective:    Today's Vitals   03/10/23 0858  PainSc: 4    There is no height or weight on file to calculate BMI.     03/10/2023    9:04 AM 03/04/2022   10:25 AM 10/26/2021    9:53 AM 09/06/2021   12:20 AM 09/05/2021    8:41 PM 06/09/2021    6:42 AM 05/02/2021    1:04 PM  Advanced Directives  Does Patient Have a Medical Advance Directive? No No No No No No No  Would patient like information on creating a medical advance directive? No - Patient declined No - Patient declined No - Patient declined No - Patient declined No - Patient declined Yes (MAU/Ambulatory/Procedural Areas - Information given)     Current Medications (verified) Outpatient Encounter Medications as of 03/10/2023  Medication Sig   ACCU-CHEK SMARTVIEW test strip TEST BLOOD SUGAR TWO TIMES DAILY   acetaminophen (TYLENOL) 500 MG tablet Take 1,000 mg by mouth every 8 (eight) hours as needed.    allopurinol (ZYLOPRIM) 100 MG tablet TAKE 1 TABLET AT BEDTIME   aspirin EC 81 MG EC tablet Take 1 tablet (81 mg total) by mouth daily. Swallow whole.    carvedilol (COREG) 12.5 MG tablet TAKE 1 TABLET TWICE DAILY   clopidogrel (PLAVIX) 75 MG tablet Take 1 tablet (75 mg total) by mouth daily.   fluticasone (FLONASE) 50 MCG/ACT nasal spray USE 2 SPRAYS IN EACH NOSTRIL ONCE DAILY - USE FOR 4 TO 6 WEEKS, THEN STOP AND USE SEASONALLY OR AS NEEDED.   Insulin Glargine ([name] KWIKPEN) 100 UNIT/ML DIAL AND INJECT 34 UNITS UNDER THE SKIN DAILY AT BEDTIME. MAX DAILY DOSE IS 34 UNITS.   Insulin Pen Needle (DROPLET PEN NEEDLES) 32G X 4 MM MISC USE WITH [name] INSULIN INJECTION DAILY AS INSTRUCTED   isosorbide mononitrate (IMDUR) 30 MG 24 hr tablet Take 0.5 tablets (15 mg total) by mouth daily.   lisinopril (ZESTRIL) 40 MG tablet TAKE 1 TABLET EVERY DAY   nitroGLYCERIN (NITROSTAT) 0.4 MG SL tablet Place 1 tablet (0.4 mg total) under the tongue every 5 (five) minutes as needed for chest pain.   pantoprazole (PROTONIX) 20 MG tablet TAKE 1 TABLET TWICE DAILY BEFORE MEALS   REPATHA SURECLICK 140 MG/ML SOAJ Inject 140 mg into the skin every 14 (fourteen) days.   RYBELSUS 14 MG TABS Take 1 tablet (14 mg total) by mouth daily before breakfast.   cephALEXin (KEFLEX) 250 MG capsule Take 1 capsule (250 mg total) by mouth daily. (Patient not taking: Reported on 03/10/2023)   No facility-administered encounter medications on file as of 03/10/2023.    Allergies (verified) Crestor [rosuvastatin calcium], Ciprofloxacin, Invokana [canagliflozin], Prednisone, and Sulfamethizole   History: Past Medical  History:  Diagnosis Date   Allergy    CAD (coronary artery disease)    a. 01/2019 MV: EF 55-65%, no ischemia; b. 12/2019 NSTEMI/PCI: LM nl, LAD 40p, 79m, D1 40, LCX nl, OM3 40, RCA 90/85p (2.75x34 Resolute Onyx DES), 90m/d.   Chronic headaches    CKD (chronic kidney disease), stage III (HCC)    Edentulous    no lower teeth   GERD (gastroesophageal reflux disease)    Gout    Heart burn    History of echocardiogram    a. 12/2019 Echo: EF 60-65%, no rwma, triv MR.    Hyperlipidemia    Hypertension    Motion sickness    boats   NSTEMI (non-ST elevated myocardial infarction) (HCC) 12/27/2019   Pulmonary embolism (HCC) 05/2017   Type 2 diabetes mellitus [facility])    Past Surgical History:  Procedure Laterality Date   BACK SURGERY  02/26/2019   CATARACT EXTRACTION W/PHACO Left 04/14/2021   Procedure: CATARACT EXTRACTION PHACO AND INTRAOCULAR LENS PLACEMENT (IOC) LEFT DIABETIC maylugin;  Surgeon: Lockie Mola, MD;  Location: [facility];  Service: Ophthalmology;  Laterality: Left;  11.22 1:25.1   CATARACT EXTRACTION W/PHACO Right 06/09/2021   Procedure: CATARACT EXTRACTION PHACO AND INTRAOCULAR LENS PLACEMENT (IOC) RIGHT DIABETIC MALYUGIN;  Surgeon: Lockie Mola, MD;  Location: [facility];  Service: Ophthalmology;  Laterality: Right;  Diabetic 4.75 01:05.7   CORONARY STENT INTERVENTION N/A 10/26/2021   Procedure: CORONARY STENT INTERVENTION;  Surgeon: Yvonne Kendall, MD;  Location: [facility] INVASIVE CV LAB;  Service: Cardiovascular;  Laterality: N/A;   CORONARY ULTRASOUND/IVUS N/A 10/26/2021   Procedure: Intravascular Ultrasound/IVUS;  Surgeon: Yvonne Kendall, MD;  Location: [facility] INVAS[facility] CV LAB;  Service: Cardiovascular;  Laterality: N/A;   KNEE SURGERY     LEFT HEART CATH AND CORONARY ANGIOGRAPHY N/A 12/30/2019   Procedure: LEFT HEART CATH AND CORONARY ANGIOGRAPHY;  Surgeon: Iran Ouch, MD;  Location: [facility] INVAS[facility] CV LAB;  Service: Cardiovascular;  Laterality: N/A;   LEFT HEART CATH AND CORONARY ANGIOGRAPHY N/A 10/26/2021   Procedure: LEFT HEART CATH AND CORONARY ANGIOGRAPHY;  Surgeon: Yvonne Kendall, MD;  Location: [facility] INVASIVE CV LAB;  Service: Cardiovascular;  Laterality: N/A;   SHOULDER SURGERY     TONSILLECTOMY     Family History  Problem Relation Age of Onset   Cancer Father        lung   Heart disease Sister    Heart disease Brother    Prostate cancer Neg Hx    Social History   Socioeconomic History    Marital status: Married    Spouse name: Not on file   Number of children: Not on file   Years of education: Not on file   Highest education level: 9th grade  Occupational History   Occupation: retired  Tobacco Use   Smoking status: Former    Types: Cigars    Quit date: 2000    Years since quitting: 24.9   Smokeless tobacco: Current    Types: Chew   Tobacco comments:    wife states patient never smoked cigarettes, just chewing tobacco  Vaping Use   Vaping status: Never Used  Substance and Sexual Activity   Alcohol use: Not Currently    Alcohol/week: 0.0 standard drinks of alcohol   Drug use: No   Sexual activity: Yes    Birth control/protection: Inserts  Other Topics Concern   Not on file  Social History Narrative   Not on file   Social Drivers of Health  Financial Resource Strain: Low Risk  (03/10/2023)   Overall Financial Resource Strain (CARDIA)    Difficulty of Paying Living Expenses: Not hard at all  Food Insecurity: No Food Insecurity (03/10/2023)   Hunger Vital Sign    Worried About Running Out of Food in the Last Year: Never true    Ran Out of Food in the Last Year: Never true  Transportation Needs: No Transportation Needs (03/10/2023)   PRAPARE - Administrator, Civil Service (Medical): No    Lack of Transportation (Non-Medical): No  Physical Activity: Sufficiently Active (03/10/2023)   Exercise Vital Sign    Days of Exercise per Week: 5 days    Minutes of Exercise per Session: 60 min  Stress: No Stress Concern Present (03/10/2023)   [employer] of Occupational Health - Occupational Stress Questionnaire    Feeling of Stress : Not at all  Social Connections: Moderately Isolated (03/10/2023)   Social Connection and Isolation Panel [NHANES]    Frequency of Communication with Friends and Family: More than three times a week    Frequency of Social Gatherings with Friends and Family: More than three times a week    Attends Religious Services:  Never    Database administrator or Organizations: No    Attends Engineer, structural: Never    Marital Status: Married    Tobacco Counseling Ready to quit: Not Answered Counseling given: Not Answered Tobacco comments: wife states patient never smoked cigarettes, just chewing tobacco   Clinical Intake:  Pre-visit preparation completed: Yes  Pain : 0-10 Pain Score: 4  Pain Type: Chronic pain Pain Location: Back Pain Orientation: Lower Pain Descriptors / Indicators: Aching, Discomfort, Constant Pain Onset: More than a month ago Pain Frequency: Constant     BMI - recorded: 31.5 Nutritional Status: BMI > 30  Obese Nutritional Risks: None Diabetes: Yes CBG done?: No Did pt. bring in CBG monitor from home?: No  How often do you need to have someone help you when you read instructions, pamphlets, or other written materials from your doctor or pharmacy?: 1 - Never  Interpreter Needed?: No  Information entered by :: Kennedy Bucker, LPN   Activities of Daily Living    03/10/2023    9:05 AM 03/06/2023    7:36 AM  In your present state of health, do you have any difficulty performing the following activities:  Hearing? 1 0   Vision? 0 0   Difficulty concentrating or making decisions? 0 0   Walking or climbing stairs? 1 1   Comment BACK   Dressing or bathing? 0 0   Doing errands, shopping? 0 0   Preparing Food and eating ? N N   Using the Toilet? N N   In the past six months, have you accidently leaked urine? N Y   Do you have problems with loss of bowel control? N N   Managing your Medications? N N   Managing your Finances? N N   Housekeeping or managing your Housekeeping? N N      Patient-reported    Patient Care Team: [name], DO as PCP - General (Family Medicine) Debbe Odea, MD as PCP - Cardiology (Cardiology) Ronney Asters, Jackelyn Poling, RPH-CPP as Pharmacist Pa, [facility] (Optometry)  Indicate any recent Medical Services  you may have received from other than [facility] providers in the past year (date may be approximate).     Assessment:   This is a routine wellness examination for  [name].  Hearing/Vision screen Hearing Screening - Comments:: NO AIDS Vision Screening - Comments:: READERS, HAD CATARACT SGY- [facility]   Goals Addressed             This Visit's Progress    DIET - INCREASE WATER INTAKE         Depression Screen    03/10/2023    9:02 AM 09/01/2022   10:26 AM 03/04/2022   10:23 AM 03/02/2022   10:34 AM 09/16/2021   10:27 AM 01/26/2021    8:31 AM 11/10/2020    2:14 PM  PHQ 2/9 Scores  PHQ - 2 Score 1 0 0 0 0 0 0  PHQ- 9 Score 2 0 0 0 0  0    Fall Risk    03/10/2023    9:05 AM 03/06/2023    7:36 AM 09/01/2022   10:26 AM 03/04/2022   10:26 AM 03/02/2022   10:34 AM  Fall Risk   Falls in the past year? 0 0 0 0 0  Number falls in past yr: 0  0 0 0  Injury with Fall? 0  0 0 0  Risk for fall due to : No Fall Risks  No Fall Risks No Fall Risks No Fall Risks  Follow up Falls prevention discussed;Falls evaluation completed  Falls evaluation completed Falls prevention discussed;Falls evaluation completed Falls evaluation completed    MEDICARE RISK AT HOME: Medicare Risk at Home Any stairs in or around the home?: Yes If so, are there any without handrails?: No Home free of loose throw rugs in walkways, pet beds, electrical cords, etc?: Yes Adequate lighting in your home to reduce risk of falls?: Yes Life alert?: No Use of a cane, walker or w/c?: Yes (CANE SOMETIMES) Grab bars in the bathroom?: No Shower chair or bench in shower?: Yes Elevated toilet seat or a handicapped toilet?: Yes  TIMED UP AND GO:  Was the test performed?  No    Cognitive Function:        03/10/2023    9:07 AM 03/04/2022   10:34 AM 01/26/2021    8:35 AM 01/21/2020    8:38 AM 08/29/2017    9:07 AM  6CIT Screen  What Year? 0 points 0 points 0 points 0 points 0 points  What month? 0 points 0 points 0 points  0 points 0 points  What time? 0 points 0 points 0 points 0 points 0 points  Count back from 20 0 points 0 points 0 points 0 points 0 points  Months in reverse 2 points 0 points 2 points 4 points 0 points  Repeat phrase 0 points 4 points 8 points 4 points 2 points  Total Score 2 points 4 points 10 points 8 points 2 points    Immunizations Immunization History  Administered Date(s) Administered   PFIZER(Purple Top)SARS-COV-2 Vaccination 05/15/2019, 06/05/2019   Tdap 07/25/2016    TDAP status: Up to date  Flu Vaccine status: Declined, Education has been provided regarding the importance of this vaccine but patient still declined. Advised may receive this vaccine at local pharmacy or Health Dept. Aware to provide a copy of the vaccination record if obtained from local pharmacy or Health Dept. Verbalized acceptance and understanding.  Pneumococcal vaccine status: Declined,  Education has been provided regarding the importance of this vaccine but patient still declined. Advised may receive this vaccine at local pharmacy or Health Dept. Aware to provide a copy of the vaccination record if obtained from local pharmacy or Health Dept.  Verbalized acceptance and understanding.   Covid-19 vaccine status: Completed vaccines  Qualifies for Shingles Vaccine? Yes   Zostavax completed No   Shingrix Completed?: No.    Education has been provided regarding the importance of this vaccine. Patient has been advised to call insurance company to determine out of pocket expense if they have not yet received this vaccine. Advised may also receive vaccine at local pharmacy or Health Dept. Verbalized acceptance and understanding.  Screening Tests Health Maintenance  Topic Date Due   Pneumonia Vaccine 41+ Years old (1 of 2 - PCV) Never done   Zoster Vaccines- Shingrix (1 of 2) Never done   COVID-19 Vaccine (3 - Pfizer risk series) 07/03/2019   FOOT EXAM  03/03/2023   INFLUENZA VACCINE  06/26/2023 (Originally  10/27/2022)   HEMOGLOBIN A1C  08/31/2023   OPHTHALMOLOGY EXAM  12/29/2023   Diabetic kidney evaluation - eGFR measurement  03/01/2024   Diabetic kidney evaluation - Urine ACR  03/01/2024   Medicare Annual Wellness (AWV)  03/09/2024   Fecal DNA (Cologuard)  08/28/2024   DTaP/Tdap/Td (2 - Td or Tdap) 07/26/2026   Hepatitis C Screening  Completed   HPV VACCINES  Aged Out    Health Maintenance  Health Maintenance Due  Topic Date Due   Pneumonia Vaccine 33+ Years old (1 of 2 - PCV) Never done   Zoster Vaccines- Shingrix (1 of 2) Never done   COVID-19 Vaccine (3 - Pfizer risk series) 07/03/2019   FOOT EXAM  03/03/2023    Colorectal cancer screening: Type of screening: Cologuard. Completed 08/28/21. Repeat every 3 years  Lung Cancer Screening: (Low Dose CT Chest recommended if Age 57-80 years, 20 pack-year currently smoking OR have quit w/in 15years.) does not qualify.    Additional Screening:  Hepatitis C Screening: does qualify; Completed 08/29/17  Vision Screening: Recommended annual ophthalmology exams for early detection of glaucoma and other disorders of the eye. Is the patient up to date with their annual eye exam?  Yes  Who is the provider or what is the name of the office in which the patient attends annual eye exams? [facility] If pt is not established with a provider, would they like to be referred to a provider to establish care? No .   Dental Screening: Recommended annual dental exams for proper oral hygiene    Community Resource Referral / Chronic Care Management: CRR required this visit?  No   CCM required this visit?  No     Plan:     I have personally reviewed and noted the following in the patient's chart:   Medical and social history Use of alcohol, tobacco or illicit drugs  Current medications and supplements including opioid prescriptions. Patient is not currently taking opioid prescriptions. Functional ability and status Nutritional status Physical  activity Advanced directives List of other physicians Hospitalizations, surgeries, and ER visits in previous 12 months Vitals Screenings to include cognitive, depression, and falls Referrals and appointments  In addition, I have reviewed and discussed with patient certain preventive protocols, quality metrics, and best practice recommendations. A written personalized care plan for preventive services as well as general preventive health recommendations were provided to patient.     Hal Hope, LPN   [id number]   After Visit Summary: (MyChart) Due to this being a telephonic visit, the after visit summary with patients personalized plan was offered to patient via MyChart   Nurse Notes: NONE

## 2023-03-10 NOTE — Patient Instructions (Addendum)
[name] , Thank you for taking time to come for your Medicare Wellness Visit. I appreciate your ongoing commitment to your health goals. Please review the following plan we discussed and let me know if I can assist you in the future.   Referrals/Orders/Follow-Ups/Clinician Recommendations: NONE  This is a list of the screening recommended for you and due dates:  Health Maintenance  Topic Date Due   Pneumonia Vaccine (1 of 2 - PCV) Never done   Zoster (Shingles) Vaccine (1 of 2) Never done   COVID-19 Vaccine (3 - Pfizer risk series) 07/03/2019   Complete foot exam   03/03/2023   Flu Shot  06/26/2023*   Hemoglobin A1C  08/31/2023   Eye exam for diabetics  12/29/2023   Yearly kidney function blood test for diabetes  03/01/2024   Yearly kidney health urinalysis for diabetes  03/01/2024   Medicare Annual Wellness Visit  03/09/2024   Cologuard (Stool DNA test)  08/28/2024   DTaP/Tdap/Td vaccine (2 - Td or Tdap) 07/26/2026   Hepatitis C Screening  Completed   HPV Vaccine  Aged Out  *Topic was postponed. The date shown is not the original due date.    Advanced directives: (ACP Link)Information on Advanced Care Planning can be found at [facility] Advance Health Care Directives Advance Health Care Directives ([email or web address]   Next Medicare Annual Wellness Visit scheduled for next year: Yes   03/15/24 @ 8:50 AM IN PERSON

## 2023-03-16 ENCOUNTER — Telehealth: Payer: Self-pay | Admitting: Pharmacy Technician

## 2023-03-16 DIAGNOSIS — E1122 Type 2 diabetes mellitus with diabetic chronic kidney disease: Secondary | ICD-10-CM | POA: Diagnosis not present

## 2023-03-16 DIAGNOSIS — N1831 Chronic kidney disease, stage 3a: Secondary | ICD-10-CM | POA: Diagnosis not present

## 2023-03-16 DIAGNOSIS — D631 Anemia in chronic kidney disease: Secondary | ICD-10-CM | POA: Diagnosis not present

## 2023-03-16 DIAGNOSIS — I10 Essential (primary) hypertension: Secondary | ICD-10-CM | POA: Diagnosis not present

## 2023-03-16 DIAGNOSIS — Z5986 Financial insecurity: Secondary | ICD-10-CM

## 2023-03-16 NOTE — Progress Notes (Signed)
Pharmacy Medication Assistance Program Note    03/16/2023  Patient ID: [name], male   DOB: [birth year], 72 y.o.   MRN: [id number]     02/16/2023 03/16/2023  Outreach Medication One  Manufacturer Medication One [employer] [facility] Rybelsus   Dose of Rybelsus 14mg    Type of Radiographer, therapeutic Assistance   Date Application Received From Patient 02/01/2023   Application Items Received From Patient Proof of Income;Application;Other   Date Application Received From Provider 01/20/2023   Date Application Submitted to Manufacturer 02/15/2023   Method Application Sent to Manufacturer Fax   Patient Assistance Determination  Approved  Approval Start Date  03/29/2023  Approval End Date  03/27/2024  Patient Notification Method  Telephone Call  Telephone Call Outcome  Successful  Additional Outreach Contact  Provider  Contacted Provider  Message   Care coordination call placed to [employer] in regard to Rybelsus application. Spoke to [place] who informs patient is APPROVED 03/29/23-03/27/24. She informs medication will auto fill and ship to prescriber's office. Patient may call [employer] at any time to check on next shipment by calling [phone]. Updated PharmD of his approval. She informs she will update patient via phone call.      02/17/2023 03/16/2023  Outreach Medication Two  Manufacturer Medication Two [employer] [facility] [name]   Dose of [name] 100units/ml   Type of Sport and exercise psychologist   Date Application Received From Patient 02/01/2023   Application Items Received From Patient Application;Proof of Income;Other   Date Application Received From Provider 02/16/2023   Method Application Sent to Manufacturer Fax   Date Application Submitted to Manufacturer 02/16/2023   Patient Assistance Determination  Approved  Approval Start Date  03/29/2023  Patient Notification Method  Telephone Call  Telephone Call Outcome  Successful  Additional  Outreach Contact  Provider  Contacted Provider  Message   Care coordination call placed to [name] in regard to [facility] application. Per IVR system, patient is APPROVED 03/29/23-03/27/24. Medication should auto fill and ship to patient's home address on file. Patient may call [name] at any time to check on next shipment by calling [phone]. [facility] PharmD made aware of approval and informs she will out reach patient regarding this information.  Signature   Krist[facility]  Office: [phone] Fax: [phone] Email: [email or web address]

## 2023-03-17 DIAGNOSIS — M47817 Spondylosis without myelopathy or radiculopathy, lumbosacral region: Secondary | ICD-10-CM | POA: Diagnosis not present

## 2023-03-17 DIAGNOSIS — E119 Type 2 diabetes mellitus without complications: Secondary | ICD-10-CM | POA: Diagnosis not present

## 2023-03-23 ENCOUNTER — Telehealth: Payer: Self-pay | Admitting: Cardiology

## 2023-03-23 NOTE — Telephone Encounter (Signed)
Spoke to patient and he stated that he had a lumbar radiofrequency ablation (RFA) on Friday 03/17/23. Patient reporting that he is experiencing some chest pain post ablation. Patient denies radiating chest pain, shortness of breath at rest, nausea, vomiting and uncontrolled pain. Informed patient that while a lumbar radiofrequency ablation (RFA) procedure is primarily targeted at lower back pain, experiencing chest pain after the procedure is possible, although not typical; this chest pain is usually mild and temporary, often attributed to muscle soreness from positioning during the procedure or anxiety related to the medical intervention, and should gradually subside within a few days. Informed patient that he could continue taking Tylenol as directed and to stay hydrated. Informed patient of emergency department presentation protocols (radiating chest pain, shortness of breath at rest) and patient understood with read back.

## 2023-03-23 NOTE — Telephone Encounter (Signed)
Pt c/o of Chest Pain: STAT if CP now or developed within 24 hours  1. Are you having CP right now? Yes  2. Are you experiencing any other symptoms (ex. SOB, nausea, vomiting, sweating)? Headache right now and he's been feeling nauseous all week.  3. How long have you been experiencing CP? Since Sunday  4. Is your CP continuous or coming and going? Coming and going.  5. Have you taken Nitroglycerin? No  ?

## 2023-03-28 ENCOUNTER — Ambulatory Visit (INDEPENDENT_AMBULATORY_CARE_PROVIDER_SITE_OTHER): Payer: Medicare HMO | Admitting: Family Medicine

## 2023-03-28 ENCOUNTER — Encounter: Payer: Self-pay | Admitting: Family Medicine

## 2023-03-28 VITALS — BP 116/64 | HR 79 | Ht 71.0 in | Wt 221.0 lb

## 2023-03-28 DIAGNOSIS — R110 Nausea: Secondary | ICD-10-CM

## 2023-03-28 DIAGNOSIS — R0609 Other forms of dyspnea: Secondary | ICD-10-CM | POA: Diagnosis not present

## 2023-03-28 MED ORDER — ONDANSETRON 4 MG PO TBDP: 4.0000 mg | ORAL_TABLET | Freq: Three times a day (TID) | ORAL | 0 refills | Status: DC | PRN

## 2023-03-28 NOTE — Patient Instructions (Addendum)
 Thank you for coming to the office today.  For nausea take Zofran  as needed  The symptoms may be related still to the Ablation for the shortness of breath.  May take the Keflex  cephalexin  antibiotic if still having any urinary symptoms.  Lungs are clear. No sign of bronchitis or pneumonia.  Please schedule a Follow-up Appointment to: Return if symptoms worsen or fail to improve.  If you have any other questions or concerns, please feel free to call the office or send a message through MyChart. You may also schedule an earlier appointment if necessary.  Additionally, you may be receiving a survey about your experience at our office within a few days to 1 week by e-mail or mail. We value your feedback.  Marsa Officer, DO [facility], [place]

## 2023-03-28 NOTE — Progress Notes (Signed)
 Subjective:    Patient ID: [name], male    DOB: May 20, 1950, 72 y.o.   MRN: [id number]  [name] is a 72 y.o. male presenting on 03/28/2023 for Sore Throat  Patient presents for a same day appointment.   HPI  Discussed the use of AI scribe software for clinical note transcription with the patient, who gave verbal consent to proceed.  History of Present Illness     The patient, with a recent history of lumbar radiofrequency ablation 03/17/23, presents with a week-long history of generalized weakness, shortness of breath on exertion, and nausea. The patient reports that these symptoms began after a minor physical activity, which he initially attributed to the recent procedure. He denies any chest pain, fever, cough, or nasal congestion. However, he reports a hoarse voice and a sore throat, which at one point made swallowing difficult, but this has since improved. -  he called Cardiology and they advised may be related to the lumbar radiofrequency ablation RFA  The patient also reports a sensation of 'giving out' or fatigue after minor exertion, such as walking to the mailbox or shopping. He denies any significant dyspnea at rest. He also mentions occasional burning during urination and irregular bowel movements, but it is unclear if these symptoms are related to the current illness.   The patient has a supply of Keflex  (cephalexin ) at home, which was previously prescribed for a urinary issue with history of recurrent UTI in past.          03/10/2023    9:02 AM 09/01/2022   10:26 AM 03/04/2022   10:23 AM  Depression screen PHQ 2/9  Decreased Interest 0 0 0  Down, Depressed, Hopeless 1 0 0  PHQ - 2 Score 1 0 0  Altered sleeping 0 0 0  Tired, decreased energy 1 0 0  Change in appetite 0 0 0  Feeling bad or failure about yourself  0 0 0  Trouble concentrating 0 0 0  Moving slowly or fidgety/restless 0 0 0  Suicidal thoughts 0 0 0  PHQ-9 Score 2 0 0  Difficult  doing work/chores Not difficult at all Not difficult at all Not difficult at all       09/01/2022   10:26 AM 03/02/2022   10:34 AM 09/16/2021   10:28 AM 11/10/2020    2:14 PM  GAD 7 : Generalized Anxiety Score  Nervous, Anxious, on Edge 0 0 0 0  Control/stop worrying 0 0 0 0  Worry too much - different things 0 0 0 0  Trouble relaxing 0 0 0 0  Restless 0 0 0 0  Easily annoyed or irritable 0 0 0 0  Afraid - awful might happen 0 0 0 0  Total GAD 7 Score 0 0 0 0  Anxiety Difficulty Not difficult at all Not difficult at all Not difficult at all Not difficult at all    Social History   Tobacco Use   Smoking status: Former    Current packs/day: 0.00    Types: Cigars, Cigarettes    Quit date: 03/28/1998    Years since quitting: 25.0   Smokeless tobacco: Current    Types: Chew   Tobacco comments:    wife states patient never smoked cigarettes, just chewing tobacco  Vaping Use   Vaping status: Never Used  Substance Use Topics   Alcohol use: Not Currently   Drug use: No    Review of Systems Per HPI unless specifically indicated  above     Objective:    BP 116/64   Pulse 79   Ht 5' 11 (1.803 m)   Wt 221 lb (100.2 kg)   SpO2 98%   BMI 30.82 kg/m   Wt Readings from Last 3 Encounters:  03/28/23 221 lb (100.2 kg)  03/10/23 231 lb 9.6 oz (105.1 kg)  12/05/22 226 lb (102.5 kg)    Physical Exam Vitals and nursing note reviewed.  Constitutional:      General: He is not in acute distress.    Appearance: He is well-developed. He is not diaphoretic.     Comments: Well-appearing, comfortable, cooperative  HENT:     Head: Normocephalic and atraumatic.  Eyes:     General:        Right eye: No discharge.        Left eye: No discharge.     Conjunctiva/sclera: Conjunctivae normal.  Neck:     Thyroid: No thyromegaly.  Cardiovascular:     Rate and Rhythm: Normal rate and regular rhythm.     Pulses: Normal pulses.     Heart sounds: Normal heart sounds. No murmur  heard. Pulmonary:     Effort: Pulmonary effort is normal. No respiratory distress.     Breath sounds: Normal breath sounds. No wheezing or rales.  Musculoskeletal:        General: Normal range of motion.     Cervical back: Normal range of motion and neck supple.  Lymphadenopathy:     Cervical: No cervical adenopathy.  Skin:    General: Skin is warm and dry.     Findings: No erythema or rash.  Neurological:     Mental Status: He is alert and oriented to person, place, and time. Mental status is at baseline.  Psychiatric:        Behavior: Behavior normal.     Comments: Well groomed, good eye contact, normal speech and thoughts     Results for orders placed or performed in visit on 03/02/23  Urine Microalbumin w/creat. ratio   Collection Time: 03/02/23  8:24 AM  Result Value Ref Range   Creatinine, Urine 128 20 - 320 mg/dL   Microalb, Ur 6.5 mg/dL   Microalb Creat Ratio 51 (H) <30 mg/g creat  Uric acid   Collection Time: 03/02/23  8:24 AM  Result Value Ref Range   Uric Acid, Serum 5.5 4.0 - 8.0 mg/dL  TSH   Collection Time: 03/02/23  8:24 AM  Result Value Ref Range   TSH 3.90 0.40 - 4.50 mIU/L  PSA   Collection Time: 03/02/23  8:24 AM  Result Value Ref Range   PSA 10.44 (H) < OR = 4.00 ng/mL  Hemoglobin A1c   Collection Time: 03/02/23  8:24 AM  Result Value Ref Range   Hgb A1c MFr Bld 8.3 (H) <5.7 % of total Hgb   Mean Plasma Glucose 192 mg/dL   eAG (mmol/L) 89.3 mmol/L  Lipid panel   Collection Time: 03/02/23  8:24 AM  Result Value Ref Range   Cholesterol 116 <200 mg/dL   HDL 32 (L) > OR = 40 mg/dL   Triglycerides 803 (H) <150 mg/dL   LDL Cholesterol (Calc) 57 mg/dL (calc)   Total CHOL/HDL Ratio 3.6 <5.0 (calc)   Non-HDL Cholesterol (Calc) 84 <869 mg/dL (calc)  CBC with Differential/Platelet   Collection Time: 03/02/23  8:24 AM  Result Value Ref Range   WBC 6.1 3.8 - 10.8 Thousand/uL   RBC 4.53 4.20 - 5.80 Million/uL  Hemoglobin 13.1 (L) 13.2 - 17.1 g/dL    HCT 59.2 61.4 - 49.9 %   MCV 89.8 80.0 - 100.0 fL   MCH 28.9 27.0 - 33.0 pg   MCHC 32.2 32.0 - 36.0 g/dL   RDW 86.2 88.9 - 84.9 %   Platelets 271 140 - 400 Thousand/uL   MPV 10.1 7.5 - 12.5 fL   Neutro Abs 3,843 1,500 - 7,800 cells/uL   Absolute Lymphocytes 1,604 850 - 3,900 cells/uL   Absolute Monocytes 390 200 - 950 cells/uL   Eosinophils Absolute 220 15 - 500 cells/uL   Basophils Absolute 43 0 - 200 cells/uL   Neutrophils Relative % 63 %   Total Lymphocyte 26.3 %   Monocytes Relative 6.4 %   Eosinophils Relative 3.6 %   Basophils Relative 0.7 %  COMPLETE METABOLIC PANEL WITH GFR   Collection Time: 03/02/23  8:24 AM  Result Value Ref Range   Glucose, Bld 185 (H) 65 - 99 mg/dL   BUN 19 7 - 25 mg/dL   Creat 8.50 (H) 9.29 - 1.28 mg/dL   eGFR 50 (L) > OR = 60 mL/min/1.12m2   BUN/Creatinine Ratio 13 6 - 22 (calc)   Sodium 138 135 - 146 mmol/L   Potassium 4.6 3.5 - 5.3 mmol/L   Chloride 102 98 - 110 mmol/L   CO2 29 20 - 32 mmol/L   Calcium  9.4 8.6 - 10.3 mg/dL   Total Protein 7.0 6.1 - 8.1 g/dL   Albumin 4.0 3.6 - 5.1 g/dL   Globulin 3.0 1.9 - 3.7 g/dL (calc)   AG Ratio 1.3 1.0 - 2.5 (calc)   Total Bilirubin 0.5 0.2 - 1.2 mg/dL   Alkaline phosphatase (APISO) 64 35 - 144 U/L   AST 14 10 - 35 U/L   ALT 13 9 - 46 U/L      Assessment & Plan:   Problem List Items Addressed This Visit   None Visit Diagnoses       Nausea    -  Primary   Relevant Medications   ondansetron  (ZOFRAN -ODT) 4 MG disintegrating tablet     Dyspnea on exertion            Seems overall improved today  Post-ablation syndrome Symptoms of weakness, nausea, and shortness of breath started a week ago after a recent ablation procedure. No fever, cough, or significant respiratory symptoms. Chest pain has improved / resolved now. -Continue supportive care. -Rx Zofran  as needed for nausea.  Dyspnea, seems to be more chronic in nature. He can return to Cardiology for further work up if not  resolving.  Possible UTI Patient reported burning during urination. -Consider taking Keflex  if urinary symptoms persist. Already has antibiotic, history of recurrent UTI  Hyperglycemia Patient reported elevated blood sugars since the onset of symptoms. -Continue to monitor blood glucose levels and adjust diet as previously discussed.  -Return to urgent care or ER if symptoms worsen or do not improve.         No orders of the defined types were placed in this encounter.   Meds ordered this encounter  Medications   ondansetron  (ZOFRAN -ODT) 4 MG disintegrating tablet    Sig: Take 1 tablet (4 mg total) by mouth every 8 (eight) hours as needed for nausea or vomiting.    Dispense:  30 tablet    Refill:  0    Follow up plan: Return if symptoms worsen or fail to improve.   Marsa Officer, DO [facility] 03/28/2023, 11:48 AM

## 2023-04-06 ENCOUNTER — Other Ambulatory Visit: Payer: Self-pay | Admitting: Family Medicine

## 2023-04-06 DIAGNOSIS — I10 Essential (primary) hypertension: Secondary | ICD-10-CM

## 2023-04-10 NOTE — Telephone Encounter (Signed)
 Requested Prescriptions  Pending Prescriptions Disp Refills   lisinopril  (ZESTRIL ) 40 MG tablet [Pharmacy Med Name: Lisinopril  Oral Tablet 40 MG] 90 tablet 3    Sig: TAKE 1 TABLET EVERY DAY     Cardiovascular:  ACE Inhibitors Failed - 04/10/2023 10:37 AM      Failed - Cr in normal range and within 180 days    Creat  Date Value Ref Range Status  03/02/2023 1.49 (H) 0.70 - 1.28 mg/dL Final   Creatinine, Urine  Date Value Ref Range Status  03/02/2023 128 20 - 320 mg/dL Final         Passed - K in normal range and within 180 days    Potassium  Date Value Ref Range Status  03/02/2023 4.6 3.5 - 5.3 mmol/L Final  10/21/2011 5.0 3.5 - 5.1 mmol/L Final         Passed - Patient is not pregnant      Passed - Last BP in normal range    BP Readings from Last 1 Encounters:  03/28/23 116/64         Passed - Valid encounter within last 6 months    Recent Outpatient Visits           1 week ago Nausea   [place] [facility], Marsa PARAS, DO   1 month ago Annual physical exam   [place] [facility] [place] Marsa PARAS, DO   3 months ago Type 2 diabetes mellitus with other specified complication, with long-term current use of insulin  [facility])   [facility] Delles, Sharyle A, RPH-CPP   4 months ago Type 2 diabetes mellitus with other specified complication, with long-term current use of insulin  (HCC)   [facility] D[facility], Sharyle A, RPH-CPP   6 months ago Type 2 diabetes mellitus with other specified complication, with long-term current use of insulin  (HCC)   [place] [facility] Delles, Sharyle LABOR, RPH-CPP       Future Appointments             In 5 months Edman, Marsa PARAS, DO [place] [facility]             carvedilol  (COREG ) 12.5 MG tablet [Pharmacy Med Name: Carvedilol  Oral Tablet 12.5 MG] 180 tablet 3    Sig: TAKE  1 TABLET TWICE DAILY     Cardiovascular: Beta Blockers 3 Failed - 04/10/2023 10:37 AM      Failed - Cr in normal range and within 360 days    Creat  Date Value Ref Range Status  03/02/2023 1.49 (H) 0.70 - 1.28 mg/dL Final   Creatinine, Urine  Date Value Ref Range Status  03/02/2023 128 20 - 320 mg/dL Final         Passed - AST in normal range and within 360 days    AST  Date Value Ref Range Status  03/02/2023 14 10 - 35 U/L Final         Passed - ALT in normal range and within 360 days    ALT  Date Value Ref Range Status  03/02/2023 13 9 - 46 U/L Final         Passed - Last BP in normal range    BP Readings from Last 1 Encounters:  03/28/23 116/64         Passed - Last Heart Rate in normal range  Pulse Readings from Last 1 Encounters:  03/28/23 79         Passed - Valid encounter within last 6 months    Recent Outpatient Visits           1 week ago Nausea   [facility] [place], Marsa PARAS, DO   1 month ago Annual physical exam   [place] [facility], Marsa PARAS, DO   3 months ago Type 2 diabetes mellitus with other specified complication, with long-term current use of insulin  [facility])   [place] [facility] Delles, Sharyle A, RPH-CPP   4 months ago Type 2 diabetes mellitus with other specified complication, with long-term current use of insulin  [facility])   [facility] Delles, Sharyle A, RPH-CPP   6 months ago Type 2 diabetes mellitus with other specified complication, with long-term current use of insulin  [facility])    [facility] Delles, Sharyle LABOR, RPH-CPP       Future Appointments             In 5 months Edman, Marsa PARAS, DO  [facility]

## 2023-04-18 ENCOUNTER — Other Ambulatory Visit: Payer: Self-pay | Admitting: Family Medicine

## 2023-04-18 DIAGNOSIS — E1169 Type 2 diabetes mellitus with other specified complication: Secondary | ICD-10-CM

## 2023-04-18 DIAGNOSIS — I2510 Atherosclerotic heart disease of native coronary artery without angina pectoris: Secondary | ICD-10-CM

## 2023-04-18 NOTE — Telephone Encounter (Signed)
Requested Prescriptions  Pending Prescriptions Disp Refills   REPATHA SURECLICK 140 MG/ML SOAJ [Pharmacy Med Name: Repatha SureClick 140 MG/ML Subcutaneous Solution Auto-injector] 2 mL 0    Sig: INJECT 140 MG INTO THE SKIN EVERY 14 DAYS     Cardiovascular: PCSK9 Inhibitors Passed - 04/18/2023  1:59 PM      Passed - Valid encounter within last 12 months    Recent Outpatient Visits           3 weeks ago Nausea   [place] [facility] [facility] [place], Daniel Neat, DO   1 month ago Annual physical exam   [place] [facility] [place], Daniel Neat, DO   3 months ago Type 2 diabetes mellitus with other specified complication, with long-term current use of insulin [facility])   [place] [facility] Delles, Gentry Fitz A, RPH-CPP   4 months ago Type 2 diabetes mellitus with other specified complication, with long-term current use of insulin (HCC)   [place] [facility] Delles, Gentry Fitz A, RPH-CPP   6 months ago Type 2 diabetes mellitus with other specified complication, with long-term current use of insulin (HCC)   [place] [facility] Delles, Jackelyn Poling, RPH-CPP       Future Appointments             In 4 months [name], Daniel Neat, DO [place] [facility]            Passed - Lipid Panel completed within the last 12 months    Cholesterol, Total  Date Value Ref Range Status  12/28/2022 120 100 - 199 mg/dL Final   Cholesterol  Date Value Ref Range Status  03/02/2023 116 <200 mg/dL Final   LDL Cholesterol (Calc)  Date Value Ref Range Status  03/02/2023 57 mg/dL (calc) Final    Comment:    Reference range: <100 . Desirable range <100 mg/dL for primary prevention;   <70 mg/dL for patients with CHD or diabetic patients  with > or = 2 CHD risk factors. Marland Kitchen LDL-C is now calculated using the Martin-Hopkins  calculation, which is a validated novel method providing   better accuracy than the Friedewald equation in the  estimation of LDL-C.  Horald Pollen et al. Lenox Ahr. 1610;960(45): 2061-2068  (http://education.Quest[employer].com/faq/FAQ164)    HDL  Date Value Ref Range Status  03/02/2023 32 (L) > OR = 40 mg/dL Final  40/98/1191 31 (L) >39 mg/dL Final   Triglycerides  Date Value Ref Range Status  03/02/2023 196 (H) <150 mg/dL Final

## 2023-04-21 ENCOUNTER — Other Ambulatory Visit: Payer: Medicare HMO

## 2023-04-28 ENCOUNTER — Other Ambulatory Visit: Payer: Medicare HMO | Admitting: Pharmacist

## 2023-04-28 DIAGNOSIS — I129 Hypertensive chronic kidney disease with stage 1 through stage 4 chronic kidney disease, or unspecified chronic kidney disease: Secondary | ICD-10-CM

## 2023-04-28 DIAGNOSIS — E1169 Type 2 diabetes mellitus with other specified complication: Secondary | ICD-10-CM

## 2023-04-28 DIAGNOSIS — Z794 Long term (current) use of insulin: Secondary | ICD-10-CM

## 2023-04-28 NOTE — Progress Notes (Signed)
04/28/2023 Name: [name] MRN: [id number] DOB: [birth year]  Chief Complaint  Patient presents with   Medication Management   Medication Assistance    [name] is a 73 y.o. year old male who presented for a telephone visit.   They were referred to the pharmacist by their PCP for assistance in managing diabetes, hypertension, hyperlipidemia, and medication access.      Subjective:   Care Team: Primary Care Provider: [name], DO ; Next Scheduled Visit: 09/14/2023 Cardiologist: Debbe Odea, MD Nephrologist: Mosetta Pigeon, MD  ; Next Scheduled Visit: 09/21/2023 Urology: [facility]  Medication Access/Adherence  Current Pharmacy:  [facility] [address] [place] - [address] [place] [address] Phone: [phone] Fax: [phone]  [facility] - [place], [place] - [address] [place] [address] Phone: [phone] Fax: [phone]  [facility] - [place], [place] - [address] [place] [address] Phone: [phone] Fax: [phone]   Patient reports affordability concerns with their medications: No  Patient reports access/transportation concerns to their pharmacy: No  Patient reports adherence concerns with their medications:  No     Patient using weekly pillbox     Diabetes:   Current medications:  Rybelsus 14 mg daily Taking >=30 minutes before the first food, beverage, or other oral medications of the day with <=4 oz of plain water  Insulin glargine ([name]/Lantus) 37 units QHS   Medications tried in the past:  Trulicity (GI intolerance) Metformin (unable to tolerate - GI side effects) Bydureon (difficulty with device/administration) Invokana (felt dehydrated)   Current glucose readings:  - This morning fasting: 98 - Reports recent morning fasting readings ranging  98-176, but mostly 120-130    Attributes variation in readings to when had big dinner the night prior   Patient denies hypoglycemic s/sx including dizziness, shakiness, sweating              Current physical activity: reports stays active throughout the day working and walking some at home   Denies interest in continuous glucose monitoring at this time    Current medication access support: Enrolled in patient assistance for for [name] from [place] and Rybelsus from [employer] patient assistance programs through 03/27/2024    Hypertension:   Current medications:  Carvedilol 12.5 mg twice daily Lisinopril 40 mg daily  Imdur 30 mg -1/2 tablet (15 mg) daily   Patient has an automated, upper arm home BP cuff Reports home BP reading this morning 145/82, HR 66, but taken right after took his medications and chewing tobacco at the time   Denies symptoms of hypotension   Current physical activity: reports typically stays active throughout the day working and walking some at home     Hyperlipidemia/hx of CAD/NSTEMI:   Current lipid lowering medications: Repatha 140 mg every 14 days             Reports tolerating well   Previous therapies tried: atorvastatin 40 mg daily & 10 mg daily (unable to tolerate due to myalgia), ezetimibe    Have discussed importance of limiting saturated fats and trans fats in diet   Antiplatelet regimen: Aspirin 81 mg daily Clopidogrel 75 mg daily   Current physical activity: reports less active during vacation, but typically stays active throughout the day working and walking some at home   Current medication access support: Enrolled in [employer] Hypercholesterolemia - Medicare Access  grant through 03/07/2024   Objective:  Lab Results  Component Value Date   HGBA1C 8.3 (H) 03/02/2023    Lab Results  Component Value Date   CREATININE 1.49 (H) 03/02/2023   BUN 19 03/02/2023   NA 138 03/02/2023   K 4.6 03/02/2023   CL 102 03/02/2023   CO2  29 03/02/2023    Lab Results  Component Value Date   CHOL 116 03/02/2023   HDL 32 (L) 03/02/2023   LDLCALC 57 03/02/2023   TRIG 196 (H) 03/02/2023   CHOLHDL 3.6 03/02/2023   BP Readings from Last 3 Encounters:  03/28/23 116/64  03/10/23 122/74  12/05/22 138/76   Pulse Readings from Last 3 Encounters:  03/28/23 79  03/10/23 82  12/05/22 70     Medications Reviewed Today     Reviewed by Manuela Neptune, RPH-CPP (Pharmacist) on 04/28/23 at [phone]  Med List Status: <None>   Medication Order Taking? Sig Documenting Provider Last Dose Status Informant  ACCU-CHEK SMARTVIEW test strip [id number]  TEST BLOOD SUGAR TWO TIMES DAILY [name], DO  Active   acetaminophen (TYLENOL) 500 MG tablet [id number]  Take 1,000 mg by mouth every 8 (eight) hours as needed.  [provider]  Active Spouse/Significant Other, Pharmacy Records  allopurinol (ZYLOPRIM) 100 MG tablet [id number]  TAKE 1 TABLET AT BEDTIME [name], DO  Active   aspirin EC 81 MG EC tablet [id number] Yes Take 1 tablet (81 mg total) by mouth daily. Swallow whole. Alford Highland, MD Taking Active Spouse/Significant Other, Pharmacy Records  carvedilol (COREG) 12.5 MG tablet [id number] Yes TAKE 1 TABLET TWICE DAILY Karamalegos, Netta Neat, DO Taking Active   clopidogrel (PLAVIX) 75 MG tablet [id number] Yes Take 1 tablet (75 mg total) by mouth daily. Debbe Odea, MD Taking Active   fluticasone (FLONASE) 50 MCG/ACT nasal spray [id number]  USE 2 SPRAYS IN EACH NOSTRIL ONCE DAILY - USE FOR 4 TO 6 WEEKS, THEN STOP AND USE SEASONALLY OR AS NEEDED. [name], DO  Active   Insulin Glargine [facility] KWIKPEN) 100 UNIT/ML [id number] Yes DIAL AND INJECT 34 UNITS UNDER THE SKIN DAILY AT BEDTIME. MAX DAILY DOSE IS 34 UNITS.  Patient taking differently: Inject 37 Units into the skin daily.   [name], DO Taking Active   Insulin Pen Needle (DROPLET PEN NEEDLES) 32G X 4 MM  MISC [id number]  USE WITH [name] INSULIN INJECTION DAILY AS INSTRUCTED Althea Charon Netta Neat, DO  Active   isosorbide mononitrate (IMDUR) 30 MG 24 hr tablet [id number] Yes Take 0.5 tablets (15 mg total) by mouth daily. Debbe Odea, MD Taking Active   lisinopril (ZESTRIL) 40 MG tablet [id number] Yes TAKE 1 TABLET EVERY DAY Karamalegos, Netta Neat, DO Taking Active   nitroGLYCERIN (NITROSTAT) 0.4 MG SL tablet [id number]  Place 1 tablet (0.4 mg total) under the tongue every 5 (five) minutes as needed for chest pain. Karamalegos, Netta Neat, DO  Active   ondansetron (ZOFRAN-ODT) 4 MG disintegrating tablet [id number]  Take 1 tablet (4 mg total) by mouth every 8 (eight) hours as needed for nausea or vomiting. [name], DO  Active   pantoprazole (PROTONIX) 20 MG tablet [id number]  TAKE 1 TABLET TWICE DAILY BEFORE MEALS [name] DO  Active   REPATHA SURECLICK 140 MG/ML SOAJ [id number] Yes INJECT 140 MG INTO THE SKIN EVERY 14 DAYS [name], DO Taking Active   RYBELSUS 14 MG TABS [id number] Yes Take 1 tablet (14 mg total) by mouth  daily before breakfast. [name], DO Taking Active Spouse/Significant Other, Pharmacy Records           Med Note Ronney Asters, [facility] A   Wed Oct 13, 2021  8:56 AM)                Assessment/Plan:   Diabetes: - Reviewed goal A1c, goal fasting, and goal 2 hour post prandial glucose - Reviewed dietary modifications including: - have well-balanced meals throughout the day while controlling carbohydrate portion sizes and limiting nighttime snacking - Discuss alternative lower- carbohydrate snack ideas - Recommend to monitor home blood sugar, keep log of results and have this record to review during appointments, but to contact office or CM Pharmacist sooner for readings outside of established parameters or for symptoms - Patient to contact [name] patient assistance as needed for refills of [name] and [employer] patient assistance as needed for refills of Rybelsus   Hypertension: - Reviewed long term cardiovascular and renal outcomes of uncontrolled blood pressure - Reviewed appropriate blood pressure monitoring technique and reviewed goal blood pressure.  - Encourage dietary changes, including limiting salt/sodium intake and increasing intake of fruit and vegetables - Recommended to check home blood pressure and heart rate and keep a log of the results and to contact office for readings outside of established parameters   Hyperlipidemia: - Encourage patient to continue taking Repatha as directed as tolerating well     Follow Up Plan: Clinical Pharmacist will follow up with patient by telephone again on 06/23/2023 at 8:30 AM  AM     Estelle Grumbles, PharmD, P[place]y [place], CPP Clinical Pharmacist [facility] [phone]

## 2023-04-28 NOTE — Patient Instructions (Signed)
Goals Addressed             This Visit's Progress    Pharmacy - Patient Goals       Our goal A1c is less than 7%. This corresponds with fasting sugars less than 130 and 2 hour after meal sugars less than 180. Please check your fasting blood sugar each morning and keep a record of the results  Check your blood pressure once daily, and any time you have concerning symptoms like headache, chest pain, dizziness, shortness of breath, or vision changes.   Our goal is less than 130/80.  To appropriately check your blood pressure, make sure you do the following:  1) Avoid caffeine, exercise, or tobacco products for 30 minutes before checking. Empty your bladder. 2) Sit with your back supported in a flat-backed chair. Rest your arm on something flat (arm of the chair, table, etc). 3) Sit still with your feet flat on the floor, resting, for at least 5 minutes.  4) Check your blood pressure. Take 1-2 readings.  5) Write down these readings and bring with you to any provider appointments.  Bring your home blood pressure machine with you to a provider's office for accuracy comparison at least once a year.   Make sure you take your blood pressure medications before you come to any office visit, even if you were asked to fast for labs.   Feel free to call me with any questions or concerns. I look forward to our next call  Wallace Cullens, PharmD, [facility] [place] [phone]

## 2023-06-19 ENCOUNTER — Other Ambulatory Visit (INDEPENDENT_AMBULATORY_CARE_PROVIDER_SITE_OTHER): Payer: Medicare HMO | Admitting: Pharmacist

## 2023-06-19 DIAGNOSIS — Z794 Long term (current) use of insulin: Secondary | ICD-10-CM

## 2023-06-19 DIAGNOSIS — I129 Hypertensive chronic kidney disease with stage 1 through stage 4 chronic kidney disease, or unspecified chronic kidney disease: Secondary | ICD-10-CM

## 2023-06-19 DIAGNOSIS — E1169 Type 2 diabetes mellitus with other specified complication: Secondary | ICD-10-CM

## 2023-06-19 NOTE — Patient Instructions (Signed)
Goals Addressed             This Visit's Progress    Pharmacy - Patient Goals       Our goal A1c is less than 7%. This corresponds with fasting sugars less than 130 and 2 hour after meal sugars less than 180. Please check your fasting blood sugar each morning and keep a record of the results  Check your blood pressure once daily, and any time you have concerning symptoms like headache, chest pain, dizziness, shortness of breath, or vision changes.   Our goal is less than 130/80.  To appropriately check your blood pressure, make sure you do the following:  1) Avoid caffeine, exercise, or tobacco products for 30 minutes before checking. Empty your bladder. 2) Sit with your back supported in a flat-backed chair. Rest your arm on something flat (arm of the chair, table, etc). 3) Sit still with your feet flat on the floor, resting, for at least 5 minutes.  4) Check your blood pressure. Take 1-2 readings.  5) Write down these readings and bring with you to any provider appointments.  Bring your home blood pressure machine with you to a provider's office for accuracy comparison at least once a year.   Make sure you take your blood pressure medications before you come to any office visit, even if you were asked to fast for labs.   Feel free to call me with any questions or concerns. I look forward to our next call  Wallace Cullens, PharmD, [facility] [place] [phone]

## 2023-06-19 NOTE — Progress Notes (Signed)
 06/19/2023 Name: [name] MRN: [id number] DOB: March 10, 1951  Chief Complaint  Patient presents with   Medication Management   Medication Assistance    [name] is a 73 y.o. year old male who presented for a telephone visit.   They were referred to the pharmacist by their PCP for assistance in managing diabetes, hypertension, hyperlipidemia, and medication access.      Subjective:   Care Team: Primary Care Provider: [name], DO ; Next Scheduled Visit: 09/14/2023 Cardiologist: Debbe Odea, MD Nephrologist: Mosetta Pigeon, MD  ; Next Scheduled Visit: 09/27/2023 Urology: [facility]  Medication Access/Adherence  Current Pharmacy:  [facility] [address] [place] - [address] [place] [address] Phone: [phone] Fax: [phone]  [facility] - [place], [place] - [address] [place] [address] Phone: [phone] Fax: [phone]  [facility] [place], [place] - [address] [place] [address] Phone: [phone] Fax: [phone]   Patient reports affordability concerns with their medications: No  Patient reports access/transportation concerns to their pharmacy: No  Patient reports adherence concerns with their medications:  No     Patient using weekly pillbox     Diabetes:   Current medications:  Rybelsus 14 mg daily Taking >=30 minutes before the first food, beverage, or other oral medications of the day with <=4 oz of plain water  Insulin glargine ([name]/Lantus) 37 units QHS   Medications tried in the past:  Trulicity (GI intolerance) Metformin (unable to tolerate - GI side effects) Bydureon (difficulty with device/administration) Invokana (felt dehydrated)   Current glucose readings:  - This morning fasting: 203 (attributes to having large meal for supper last  night of BBQ and candy overnight) - Reports recent morning fasting readings mostly ranging 101-140  Reports has been working more/staying busy, which has helped with his appetite control     Patient denies hypoglycemic s/sx including dizziness, shakiness, sweating              Current physical activity: reports typically stays active throughout the day working and walking some at home     Current medication access support:  - Enrolled in patient assistance for for [employer] from [place] and Rybelsus from [employer] patient assistance programs through 03/27/2024    Hypertension:   Current medications:  Carvedilol 12.5 mg twice daily Lisinopril 40 mg daily  Imdur 30 mg -1/2 tablet (15 mg) daily   Patient has an automated, upper arm home BP cuff Denies checking rested home blood pressure recently   Denies symptoms of hypotension   Current physical activity: reports typically stays active throughout the day working and walking some at home     Hyperlipidemia/hx of CAD/NSTEMI:   Current lipid lowering medications: Repatha 140 mg every 14 days             Reports tolerating well   Previous therapies tried: atorvastatin 40 mg daily & 10 mg daily (unable to tolerate due to myalgia), ezetimibe    Have discussed importance of limiting saturated fats and trans fats in diet   Antiplatelet regimen: Aspirin 81 mg daily Clopidogrel 75 mg daily   Current physical activity: reports typically stays active throughout the day working and walking some at home   Current medication access support: Enrolled in [employer] Hypercholesterolemia - Medicare Access grant through 03/07/2024    Objective:  Lab Results  Component Value  Date   HGBA1C 8.3 (H) 03/02/2023    Lab Results  Component Value Date   CREATININE 1.49 (H) 03/02/2023   BUN 19 03/02/2023   NA 138 03/02/2023   K 4.6 03/02/2023   CL 102 03/02/2023   CO2 29 03/02/2023    Lab Results  Component Value Date   CHOL 116  03/02/2023   HDL 32 (L) 03/02/2023   LDLCALC 57 03/02/2023   TRIG 196 (H) 03/02/2023   CHOLHDL 3.6 03/02/2023   BP Readings from Last 3 Encounters:  03/28/23 116/64  03/10/23 122/74  12/05/22 138/76   Pulse Readings from Last 3 Encounters:  03/28/23 79  03/10/23 82  12/05/22 70    Medications Reviewed Today     Reviewed by Manuela Neptune, RPH-CPP (Pharmacist) on 06/19/23 at [phone]  Med List Status: <None>   Medication Order Taking? Sig Documenting Provider Last Dose Status Informant  ACCU-CHEK SMARTVIEW test strip [id number]  TEST BLOOD SUGAR TWO TIMES DAILY [name], DO  Active   acetaminophen (TYLENOL) 500 MG tablet [id number]  Take 1,000 mg by mouth every 8 (eight) hours as needed.  [provider]  Active Spouse/Significant Other, Pharmacy Records  allopurinol (ZYLOPRIM) 100 MG tablet [id number]  TAKE 1 TABLET AT BEDTIME [name], DO  Active   aspirin EC 81 MG EC tablet [id number]  Take 1 tablet (81 mg total) by mouth daily. Swallow whole. Alford Highland, MD  Active Spouse/Significant Other, Pharmacy Records  carvedilol (COREG) 12.5 MG tablet [id number]  TAKE 1 TABLET TWICE DAILY Karamalegos, Netta Neat, DO  Active   clopidogrel (PLAVIX) 75 MG tablet [id number]  Take 1 tablet (75 mg total) by mouth daily. Debbe Odea, MD  Active   fluticasone (FLONASE) 50 MCG/ACT nasal spray [id number]  USE 2 SPRAYS IN EACH NOSTRIL ONCE DAILY - USE FOR 4 TO 6 WEEKS, THEN STOP AND USE SEASONALLY OR AS NEEDED. [name], DO  Active   Insulin Glargine [facility] KWIKPEN) 100 UNIT/ML [id number] Yes DIAL AND INJECT 34 UNITS UNDER THE SKIN DAILY AT BEDTIME. MAX DAILY DOSE IS 34 UNITS.  Patient taking differently: Inject 37 Units into the skin daily.   [name], DO Taking Active   Insulin Pen Needle (DROPLET PEN NEEDLES) 32G X 4 MM MISC [id number]  USE WITH [name] INSULIN INJECTION DAILY AS INSTRUCTED Althea Charon Netta Neat,  DO  Active   isosorbide mononitrate (IMDUR) 30 MG 24 hr tablet [id number]  Take 0.5 tablets (15 mg total) by mouth daily. Debbe Odea, MD  Active   lisinopril (ZESTRIL) 40 MG tablet [id number]  TAKE 1 TABLET EVERY DAY Karamalegos, Alexander Shela Commons, DO  Active   nitroGLYCERIN (NITROSTAT) 0.4 MG SL tablet [id number]  Place 1 tablet (0.4 mg total) under the tongue every 5 (five) minutes as needed for chest pain. Karamalegos, Netta Neat, DO  Active   ondansetron (ZOFRAN-ODT) 4 MG disintegrating tablet [id number]  Take 1 tablet (4 mg total) by mouth every 8 (eight) hours as needed for nausea or vomiting. [name], DO  Active   pantoprazole (PROTONIX) 20 MG tablet [id number]  TAKE 1 TABLET TWICE DAILY BEFORE MEALS Kasandra Knudsen  Active   REPATHA SURECLICK 140 MG/ML SOAJ [id number]  INJECT 140 MG INTO THE SKIN EVERY [address] [name], DO  Active   RYBELSUS 14 MG TABS [id number] Yes Take 1 tablet (14 mg total) by mouth daily before breakfast. [name], DO Taking Active Spouse/Significant Other, Pharmacy Records  Med Note Ronney Asters, Arieana Somoza A   Wed Oct 13, 2021  8:56 AM)                Assessment/Plan:   Diabetes: - Reviewed goal A1c, goal fasting, and goal 2 hour post prandial glucose - Reviewed dietary modifications including having well-balanced meals throughout the day while controlling carbohydrate portion sizes and limiting nighttime snacking - Recommend to monitor home blood sugar, keep log of results and have this record to review during appointments, but to contact office or CM Pharmacist sooner for readings outside of established parameters or for symptoms - Patient to contact [name] patient assistance as needed for refills of [name] and [employer] patient assistance as needed for refills of Rybelsus   Hypertension: - Reviewed long term cardiovascular and renal outcomes of uncontrolled blood pressure - Reviewed  appropriate blood pressure monitoring technique and reviewed goal blood pressure.  - Encourage dietary changes, including limiting salt/sodium intake and increasing intake of fruit and vegetables - Recommended to check home blood pressure and heart rate and keep a log of the results and to contact office for readings outside of established parameters   Hyperlipidemia: - Encourage patient to continue taking Repatha as directed as tolerating well     Follow Up Plan: Clinical Pharmacist will follow up with patient by telephone again on 07/31/2023 at 8:30 AM    Estelle Grumbles, PharmD, Patsy Baltimore, CPP Clinical Pharmacist [facility] [phone]

## 2023-06-22 ENCOUNTER — Other Ambulatory Visit: Payer: Self-pay | Admitting: Family Medicine

## 2023-06-22 NOTE — Telephone Encounter (Signed)
 Requested medication (s) are due for refill today: No  Requested medication (s) are on the active medication list: No  Last refill:  04/12/23  Future visit scheduled: Yes  Notes to clinic:  Unable to refill due to no refill protocol for this medication.      Requested Prescriptions  Pending Prescriptions Disp Refills   cephALEXin (KEFLEX) 250 MG capsule [Pharmacy Med Name: Cephalexin Oral Capsule 250 MG] 90 capsule 3    Sig: TAKE 1 CAPSULE EVERY DAY     Off-Protocol Failed - 06/22/2023  1:34 PM      Failed - Medication not assigned to a protocol, review manually.      Failed - Valid encounter within last 12 months    Recent Outpatient Visits   None     Future Appointments             In 2 months Karamalegos, Netta Neat, DO [facility], [facility]

## 2023-06-23 ENCOUNTER — Other Ambulatory Visit: Payer: Medicare HMO

## 2023-06-26 ENCOUNTER — Other Ambulatory Visit: Payer: Self-pay | Admitting: Family Medicine

## 2023-06-26 DIAGNOSIS — E1169 Type 2 diabetes mellitus with other specified complication: Secondary | ICD-10-CM

## 2023-06-26 DIAGNOSIS — Z794 Long term (current) use of insulin: Secondary | ICD-10-CM

## 2023-06-28 NOTE — Telephone Encounter (Signed)
 Requested medication (s) are due for refill today: yes  Requested medication (s) are on the active medication list: yes  Last refill:  03/08/23 45 ml  Future visit scheduled: yes  Notes to clinic:  last Hgb A1c outside normal range - routing to provider per prototcol   Requested Prescriptions  Pending Prescriptions Disp Refills   Insulin Glargine ([name] KWIKPEN) 100 UNIT/ML [Pharmacy Med Name: [name] KwikPen Subcutaneous Solution Pen-injector 100 UNIT/ML] 45 mL 0    Sig: DIAL AND INJECT 34 UNITS UNDER THE SKIN DAILY AT BEDTIME. MAX DAILY DOSE IS 34 UNITS.     Endocrinology:  Diabetes - Insulins Failed - 06/28/2023  8:31 AM      Failed - HBA1C is between 0 and 7.9 and within 180 days    Hemoglobin A1C  Date Value Ref Range Status  01/11/2019 7.8  Final   Hgb A1c MFr Bld  Date Value Ref Range Status  03/02/2023 8.3 (H) <5.7 % of total Hgb Final    Comment:    For someone without known diabetes, a hemoglobin A1c value of 6.5% or greater indicates that they may have  diabetes and this should be confirmed with a follow-up  test. . For someone with known diabetes, a value <7% indicates  that their diabetes is well controlled and a value  greater than or equal to 7% indicates suboptimal  control. A1c targets should be individualized based on  duration of diabetes, age, comorbid conditions, and  other considerations. . Currently, no consensus exists regarding use of hemoglobin A1c for diagnosis of diabetes for children. .          Failed - Valid encounter within last 6 months    Recent Outpatient Visits   None     Future Appointments             In 2 months Karamalegos, Netta Neat, DO [facility], [facility]

## 2023-07-31 ENCOUNTER — Other Ambulatory Visit (INDEPENDENT_AMBULATORY_CARE_PROVIDER_SITE_OTHER): Admitting: Pharmacist

## 2023-07-31 DIAGNOSIS — E1169 Type 2 diabetes mellitus with other specified complication: Secondary | ICD-10-CM

## 2023-07-31 DIAGNOSIS — N183 Chronic kidney disease, stage 3 unspecified: Secondary | ICD-10-CM

## 2023-07-31 DIAGNOSIS — Z794 Long term (current) use of insulin: Secondary | ICD-10-CM

## 2023-07-31 NOTE — Patient Instructions (Signed)
Goals Addressed             This Visit's Progress    Pharmacy - Patient Goals       Our goal A1c is less than 7%. This corresponds with fasting sugars less than 130 and 2 hour after meal sugars less than 180. Please check your fasting blood sugar each morning and keep a record of the results  Check your blood pressure once daily, and any time you have concerning symptoms like headache, chest pain, dizziness, shortness of breath, or vision changes.   Our goal is less than 130/80.  To appropriately check your blood pressure, make sure you do the following:  1) Avoid caffeine, exercise, or tobacco products for 30 minutes before checking. Empty your bladder. 2) Sit with your back supported in a flat-backed chair. Rest your arm on something flat (arm of the chair, table, etc). 3) Sit still with your feet flat on the floor, resting, for at least 5 minutes.  4) Check your blood pressure. Take 1-2 readings.  5) Write down these readings and bring with you to any provider appointments.  Bring your home blood pressure machine with you to a provider's office for accuracy comparison at least once a year.   Make sure you take your blood pressure medications before you come to any office visit, even if you were asked to fast for labs.   Feel free to call me with any questions or concerns. I look forward to our next call  Wallace Cullens, PharmD, [facility] [place] [phone]

## 2023-07-31 NOTE — Progress Notes (Signed)
 07/31/2023 Name: [name] MRN: [id number] DOB: 1950/08/16  Chief Complaint  Patient presents with   Medication Management   Medication Assistance    [name] is a 73 y.o. year old male who presented for a telephone visit.   They were referred to the pharmacist by their PCP for assistance in managing diabetes, hypertension, hyperlipidemia, and medication access.      Subjective:   Care Team: Primary Care Provider: Raina Bunting, DO ; Next Scheduled Visit: 09/14/2023 Cardiologist: Constancia Delton, MD Nephrologist: Rica Chalet, MD  ; Next Scheduled Visit: 09/27/2023 Urology: [facility]  Medication Access/Adherence  Current Pharmacy:  [facility] [address] [place] - [address] [place] [address] Phone: [phone] Fax: [phone]  [facility] - [place], [place] - [address] [place] [address] Phone: [phone] Fax: [phone]  [facility] - [place], [place] - [address] [place] [address] Phone: [phone] Fax: [phone]   Patient reports affordability concerns with their medications: No  Patient reports access/transportation concerns to their pharmacy: No  Patient reports adherence concerns with their medications:  No     Patient using weekly pillbox     Diabetes:   Current medications:  Rybelsus  14 mg daily Taking >=30 minutes before the first food, beverage, or other oral medications of the day with <=4 oz of plain water   Insulin  glargine ([name] /Lantus ) 37 units QHS   Medications tried in the past:  Trulicity  (GI intolerance) Metformin  (unable to tolerate - GI side effects) Bydureon  (difficulty with device/administration) Invokana  (felt dehydrated)   Current glucose readings:  - Reports recent morning fasting readings mostly ranging 115 - 197; this  morning: 147 *Attributes higher blood sugar readings to eating out late/larger carbohydrate portion sizes this week. States last week morning fasting readings ranging 90s-140    Patient denies hypoglycemic s/sx including dizziness, shakiness, sweating              Current physical activity: reports typically stays active throughout the day working and walking some at home     Current medication access support:  - Enrolled in patient assistance for for [name]  from [name] and Rybelsus  from [employer] patient assistance programs through 03/27/2024     Hypertension:   Current medications:  Carvedilol  12.5 mg twice daily Lisinopril  40 mg daily  Imdur  30 mg -1/2 tablet (15 mg) daily   Patient has an automated, upper arm home BP cuff Reports blood pressure this morning 154/85, HR 54, but denies having yet taken his BP medication   Denies symptoms of hypotension   Current physical activity: reports typically stays active throughout the day working and walking some at home     Hyperlipidemia/hx of CAD/NSTEMI:   Current lipid lowering medications: Repatha  140 mg every 14 days             Reports tolerating well   Previous therapies tried: atorvastatin  40 mg daily & 10 mg daily (unable to tolerate due to myalgia), ezetimibe     Have discussed importance of limiting saturated fats and trans fats in diet   Antiplatelet regimen: Aspirin  81 mg daily Clopidogrel  75 mg daily   Current physical activity: reports typically stays active throughout the day working and walking some at home   Current medication access support: Enrolled in [employer] Hypercholester[employer] - Medicare Access grant through 03/07/2024   Objective:  Lab Results  Component Value Date   HGBA1C 8.3 (H) 03/02/2023    Lab Results  Component Value Date   CREATININE 1.49 (H) 03/02/2023   BUN 19 03/02/2023   NA 138 03/02/2023   K 4.6 03/02/2023   CL 102 03/02/2023   CO2 29 03/02/2023    Lab Results   Component Value Date   CHOL 116 03/02/2023   HDL 32 (L) 03/02/2023   LDLCALC 57 03/02/2023   TRIG 196 (H) 03/02/2023   CHOLHDL 3.6 03/02/2023   BP Readings from Last 3 Encounters:  03/28/23 116/64  03/10/23 122/74  12/05/22 138/76   Pulse Readings from Last 3 Encounters:  03/28/23 79  03/10/23 82  12/05/22 70     Medications Reviewed Today     Reviewed by Ardis Becton, RPH-CPP (Pharmacist) on 07/31/23 at [phone]  Med List Status: <None>   Medication Order Taking? Sig Documenting Provider Last Dose Status Informant  ACCU-CHEK SMARTVIEW test strip [id number]  TEST BLOOD SUGAR TWO TIMES DAILY Raina Bunting, DO  Active   acetaminophen  (TYLENOL ) 500 MG tablet [id number]  Take 1,000 mg by mouth every 8 (eight) hours as needed.  [provider]  Active Spouse/Significant Other, Pharmacy Records  allopurinol  (ZYLOPRIM ) 100 MG tablet [id number]  TAKE 1 TABLET AT BEDTIME Raina Bunting, DO  Active   aspirin  EC 81 MG EC tablet [id number] Yes Take 1 tablet (81 mg total) by mouth daily. Swallow whole. Verla Glaze, MD Taking Active Spouse/Significant Other, Pharmacy Records  carvedilol  (COREG ) 12.5 MG tablet [id number] Yes TAKE 1 TABLET TWICE DAILY Karamalegos, Kayleen Party, DO Taking Active   clopidogrel  (PLAVIX ) 75 MG tablet [id number] Yes Take 1 tablet (75 mg total) by mouth daily. Constancia Delton, MD Taking Active   fluticasone  (FLONASE ) 50 MCG/ACT nasal spray [id number]  USE 2 SPRAYS IN EACH NOSTRIL ONCE DAILY - USE FOR 4 TO 6 WEEKS, THEN STOP AND USE SEASONALLY OR AS NEEDED. Raina Bunting, DO  Active   Insulin  Glargine ([name]  KWIKPEN) 100 UNIT/ML [id number] Yes Inject 37 Units into the skin at bedtime. Max daily dose 37 units Karamalegos, Kayleen Party, DO Taking Active   Insulin  Pen Needle (DROPLET PEN NEEDLES) 32G X 4 MM MISC [id number]  USE WITH [name]  INSULIN  INJECTION DAILY AS INSTRUCTED Raina Bunting, DO  Active   isosorbide   mononitrate (IMDUR ) 30 MG 24 hr tablet [id number] Yes Take 0.5 tablets (15 mg total) by mouth daily. Constancia Delton, MD Taking Active   lisinopril  (ZESTRIL ) 40 MG tablet [id number] Yes TAKE 1 TABLET EVERY DAY Karamalegos, Kayleen Party, DO Taking Active   nitroGLYCERIN  (NITROSTAT ) 0.4 MG SL tablet [id number]  Place 1 tablet (0.4 mg total) under the tongue every 5 (five) minutes as needed for chest pain. Raina Bunting, DO  Active   ondansetron  (ZOFRAN -ODT) 4 MG disintegrating tablet [id number]  Take 1 tablet (4 mg total) by mouth every 8 (eight) hours as needed for nausea or vomiting. Raina Bunting, DO  Active   pantoprazole  (PROTONIX ) 20 MG tablet [id number]  TAKE 1 TABLET TWICE DAILY BEFORE MEALS Raina Bunting, DO  Active   REPATHA  SURECLICK 140 MG/ML SOAJ [id number] Yes INJECT 140 MG INTO THE SKIN EVERY 14 DAYS Raina Bunting, DO Taking Active   RYBELSUS  14 MG TABS [id number] Yes Take 1 tablet (14 mg total) by mouth daily before breakfast. Raina Bunting, DO Taking Active Spouse/Significant Other, Pharmacy Records           Med Note [place], [facility]  A   Wed Oct 13, 2021  8:56 AM)                Assessment/Plan:   Diabetes: - Reviewed goal A1c, goal fasting, and goal 2 hour post prandial glucose - Reviewed dietary modifications including having well-balanced meals throughout the day while controlling carbohydrate portion sizes and limiting nighttime snacking  Patient planning to work on reducing supper portion sizes - Recommend to monitor home blood sugar, keep log of results and have this record to review during appointments, but to contact office or CM Pharmacist sooner for readings outside of established parameters or for symptoms - Patient to contact [name] patient assistance as needed for refills of [name]  and [employer] patient assistance as needed for refills of Rybelsus    Hypertension: - Reviewed long term cardiovascular and  renal outcomes of uncontrolled blood pressure - Reviewed appropriate blood pressure monitoring technique and reviewed goal blood pressure.  - Encourage dietary changes, including limiting salt/sodium intake and increasing intake of fruit and vegetables - Recommended to check home blood pressure and heart rate and keep a log of the results and to contact office for readings outside of established parameters   Hyperlipidemia: - Encourage patient to continue taking Repatha  as directed as tolerating well     Follow Up Plan: Clinical Pharmacist will follow up with patient by telephone again on 10/23/2023 at 8:30 AM     Arthur Lash, PharmD, Becky Bowels, CPP Clinical Pharmacist [facility] [phone]

## 2023-08-03 ENCOUNTER — Other Ambulatory Visit: Payer: Self-pay

## 2023-08-03 DIAGNOSIS — I2510 Atherosclerotic heart disease of native coronary artery without angina pectoris: Secondary | ICD-10-CM

## 2023-08-03 MED ORDER — ISOSORBIDE MONONITRATE ER 30 MG PO TB24: 15.0000 mg | ORAL_TABLET | Freq: Every day | ORAL | 3 refills | Status: DC

## 2023-08-03 MED ORDER — CLOPIDOGREL BISULFATE 75 MG PO TABS: 75.0000 mg | ORAL_TABLET | Freq: Every day | ORAL | 3 refills | Status: DC

## 2023-08-10 ENCOUNTER — Other Ambulatory Visit: Payer: Self-pay

## 2023-08-10 DIAGNOSIS — I2510 Atherosclerotic heart disease of native coronary artery without angina pectoris: Secondary | ICD-10-CM

## 2023-08-10 MED ORDER — ISOSORBIDE MONONITRATE ER 30 MG PO TB24: 15.0000 mg | ORAL_TABLET | Freq: Every day | ORAL | 3 refills | Status: AC

## 2023-08-17 ENCOUNTER — Other Ambulatory Visit: Payer: Self-pay | Admitting: Family Medicine

## 2023-08-17 DIAGNOSIS — E1169 Type 2 diabetes mellitus with other specified complication: Secondary | ICD-10-CM

## 2023-08-18 NOTE — Telephone Encounter (Signed)
 Requested Prescriptions  Pending Prescriptions Disp Refills   Insulin  Pen Needle (DROPLET PEN NEEDLES) 32G X 4 MM MISC [Pharmacy Med Name: Droplet Pen Needles Miscellaneous 32G X 4 MM] 100 each 0    Sig: USE WITH [name]  INSULIN  INJECTION DAILY AS INSTRUCTED     Endocrinology: Diabetes - Testing Supplies Failed - 08/18/2023  3:35 PM      Failed - Valid encounter within last 12 months    Recent Outpatient Visits   None     Future Appointments             In 3 weeks Romeo [place], Kayleen Party, DO [facility], [facility]

## 2023-09-04 ENCOUNTER — Other Ambulatory Visit: Payer: Self-pay | Admitting: Family Medicine

## 2023-09-04 DIAGNOSIS — K219 Gastro-esophageal reflux disease without esophagitis: Secondary | ICD-10-CM

## 2023-09-04 DIAGNOSIS — M1000 Idiopathic gout, unspecified site: Secondary | ICD-10-CM

## 2023-09-04 DIAGNOSIS — I10 Essential (primary) hypertension: Secondary | ICD-10-CM

## 2023-09-05 NOTE — Telephone Encounter (Signed)
 Requested by interface surescripts. Last labs 03/02/23 future visit in 1 week.  Requested Prescriptions  Pending Prescriptions Disp Refills   allopurinol  (ZYLOPRIM ) 100 MG tablet [Pharmacy Med Name: Allopurinol  Oral Tablet 100 MG] 90 tablet 1    Sig: TAKE 1 TABLET AT BEDTIME     Endocrinology:  Gout Agents - allopurinol  Failed - 09/05/2023 10:40 AM      Failed - Cr in normal range and within 360 days    Creat  Date Value Ref Range Status  03/02/2023 1.49 (H) 0.70 - 1.28 mg/dL Final   Creatinine, Urine  Date Value Ref Range Status  03/02/2023 128 20 - 320 mg/dL Final         Failed - Valid encounter within last 12 months    Recent Outpatient Visits   None     Future Appointments             In 1 week Raina Bunting, DO [place] [facility], [facility]            Passed - Uric Acid in normal range and within 360 days    Uric Acid, Serum  Date Value Ref Range Status  03/02/2023 5.5 4.0 - 8.0 mg/dL Final    Comment:    Therapeutic target for gout patients: <6.0 mg/dL .    Uric Acid  Date Value Ref Range Status  11/12/2019 6.6 3.8 - 8.4 mg/dL Final    Comment:               Therapeutic target for gout patients: <6.0         Passed - CBC within normal limits and completed in the last 12 months    WBC  Date Value Ref Range Status  03/02/2023 6.1 3.8 - 10.8 Thousand/uL Final   RBC  Date Value Ref Range Status  03/02/2023 4.53 4.20 - 5.80 Million/uL Final   Hemoglobin  Date Value Ref Range Status  03/02/2023 13.1 (L) 13.2 - 17.1 g/dL Final  16/12/9602 54.0 13.0 - 17.7 g/dL Final   HCT  Date Value Ref Range Status  03/02/2023 40.7 38.5 - 50.0 % Final   Hematocrit  Date Value Ref Range Status  11/12/2019 42.4 37.5 - 51.0 % Final   MCHC  Date Value Ref Range Status  03/02/2023 32.2 32.0 - 36.0 g/dL Final    Comment:    For adults, a slight decrease in the calculated MCHC value (in the range of 30 to 32 g/dL) is most likely not  clinically significant; however, it should be interpreted with caution in correlation with other red cell parameters and the patient's clinical condition.    [facility]  Date Value Ref Range Status  03/02/2023 28.9 27.0 - 33.0 pg Final   MCV  Date Value Ref Range Status  03/02/2023 89.8 80.0 - 100.0 fL Final  11/12/2019 91 79 - 97 fL Final  10/21/2011 90 80 - 100 fL Final   No results found for: "PLTCOUNTKUC", "LABPLAT", "POCPLA" RDW  Date Value Ref Range Status  03/02/2023 13.7 11.0 - 15.0 % Final  11/12/2019 13.8 11.6 - 15.4 % Final  10/21/2011 13.9 11.5 - 14.5 % Final          pantoprazole  (PROTONIX ) 20 MG tablet [Pharmacy Med Name: Pantoprazole  Sodium Oral Tablet Delayed Release 20 MG] 180 tablet 1    Sig: TAKE 1 TABLET TWICE DAILY BEFORE MEALS     Gastroenterology: Proton Pump Inhibitors Failed - 09/05/2023 10:40 AM  Failed - Valid encounter within last 12 months    Recent Outpatient Visits   None     Future Appointments             In 1 week Karamalegos, Kayleen Party, DO [place] [facility], [facility]             carvedilol  (COREG ) 12.5 MG tablet [Pharmacy Med Name: Carvedilol  Oral Tablet 12.5 MG] 180 tablet 0    Sig: TAKE 1 TABLET TWICE DAILY     Cardiovascular: Beta Blockers 3 Failed - 09/05/2023 10:40 AM      Failed - Cr in normal range and within 360 days    Creat  Date Value Ref Range Status  03/02/2023 1.49 (H) 0.70 - 1.28 mg/dL Final   Creatinine, Urine  Date Value Ref Range Status  03/02/2023 128 20 - 320 mg/dL Final         Failed - Valid encounter within last 6 months    Recent Outpatient Visits   None     Future Appointments             In 1 week Raina Bunting, DO [place] [facility], [facility]            Passed - AST in normal range and within 360 days    AST  Date Value Ref Range Status  03/02/2023 14 10 - 35 U/L Final         Passed - ALT in normal range and within 360 days    ALT   Date Value Ref Range Status  03/02/2023 13 9 - 46 U/L Final         Passed - Last BP in normal range    BP Readings from Last 1 Encounters:  03/28/23 116/64         Passed - Last Heart Rate in normal range    Pulse Readings from Last 1 Encounters:  03/28/23 79          lisinopril  (ZESTRIL ) 40 MG tablet [Pharmacy Med Name: Lisinopril  Oral Tablet 40 MG] 90 tablet 0    Sig: TAKE 1 TABLET EVERY DAY     Cardiovascular:  ACE Inhibitors Failed - 09/05/2023 10:40 AM      Failed - Cr in normal range and within 180 days    Creat  Date Value Ref Range Status  03/02/2023 1.49 (H) 0.70 - 1.28 mg/dL Final   Creatinine, Urine  Date Value Ref Range Status  03/02/2023 128 20 - 320 mg/dL Final         Failed - K in normal range and within 180 days    Potassium  Date Value Ref Range Status  03/02/2023 4.6 3.5 - 5.3 mmol/L Final  10/21/2011 5.0 3.5 - 5.1 mmol/L Final         Failed - Valid encounter within last 6 months    Recent Outpatient Visits   None     Future Appointments             In 1 week Raina Bunting, DO  [facility], [facility]            Passed - Patient is not pregnant      Passed - Last BP in normal range    BP Readings from Last 1 Encounters:  03/28/23 116/64

## 2023-09-05 NOTE — Telephone Encounter (Signed)
 Requested by interface sure scripts. Future visit in 1 week.  Requested Prescriptions  Pending Prescriptions Disp Refills   carvedilol  (COREG ) 12.5 MG tablet [Pharmacy Med Name: Carvedilol  Oral Tablet 12.5 MG] 180 tablet 0    Sig: TAKE 1 TABLET TWICE DAILY     Cardiovascular: Beta Blockers 3 Failed - 09/05/2023 10:43 AM      Failed - Cr in normal range and within 360 days    Creat  Date Value Ref Range Status  03/02/2023 1.49 (H) 0.70 - 1.28 mg/dL Final   Creatinine, Urine  Date Value Ref Range Status  03/02/2023 128 20 - 320 mg/dL Final         Failed - Valid encounter within last 6 months    Recent Outpatient Visits   None     Future Appointments             In 1 week Karamalegos, Kayleen Party, DO [facility], [facility]            Passed - AST in normal range and within 360 days    AST  Date Value Ref Range Status  03/02/2023 14 10 - 35 U/L Final         Passed - ALT in normal range and within 360 days    ALT  Date Value Ref Range Status  03/02/2023 13 9 - 46 U/L Final         Passed - Last BP in normal range    BP Readings from Last 1 Encounters:  03/28/23 116/64         Passed - Last Heart Rate in normal range    Pulse Readings from Last 1 Encounters:  03/28/23 79          lisinopril  (ZESTRIL ) 40 MG tablet [Pharmacy Med Name: Lisinopril  Oral Tablet 40 MG] 90 tablet 0    Sig: TAKE 1 TABLET EVERY DAY     Cardiovascular:  ACE Inhibitors Failed - 09/05/2023 10:43 AM      Failed - Cr in normal range and within 180 days    Creat  Date Value Ref Range Status  03/02/2023 1.49 (H) 0.70 - 1.28 mg/dL Final   Creatinine, Urine  Date Value Ref Range Status  03/02/2023 128 20 - 320 mg/dL Final         Failed - K in normal range and within 180 days    Potassium  Date Value Ref Range Status  03/02/2023 4.6 3.5 - 5.3 mmol/L Final  10/21/2011 5.0 3.5 - 5.1 mmol/L Final         Failed - Valid encounter within last 6 months    Recent  Outpatient Visits   None     Future Appointments             In 1 week Raina Bunting, DO [place] [facility], [facility]            Passed - Patient is not pregnant      Passed - Last BP in normal range    BP Readings from Last 1 Encounters:  03/28/23 116/64         Signed Prescriptions Disp Refills   allopurinol  (ZYLOPRIM ) 100 MG tablet 90 tablet 1    Sig: TAKE 1 TABLET AT BEDTIME     Endocrinology:  Gout Agents - allopurinol  Failed - 09/05/2023 10:43 AM      Failed - Cr in normal range and within 360 days  Creat  Date Value Ref Range Status  03/02/2023 1.49 (H) 0.70 - 1.28 mg/dL Final   Creatinine, Urine  Date Value Ref Range Status  03/02/2023 128 20 - 320 mg/dL Final         Failed - Valid encounter within last 12 months    Recent Outpatient Visits   None     Future Appointments             In 1 week Raina Bunting, DO [place] [facility], [facility]            Passed - Uric Acid in normal range and within 360 days    Uric Acid, Serum  Date Value Ref Range Status  03/02/2023 5.5 4.0 - 8.0 mg/dL Final    Comment:    Therapeutic target for gout patients: <6.0 mg/dL .    Uric Acid  Date Value Ref Range Status  11/12/2019 6.6 3.8 - 8.4 mg/dL Final    Comment:               Therapeutic target for gout patients: <6.0         Passed - CBC within normal limits and completed in the last 12 months    WBC  Date Value Ref Range Status  03/02/2023 6.1 3.8 - 10.8 Thousand/uL Final   RBC  Date Value Ref Range Status  03/02/2023 4.53 4.20 - 5.80 Million/uL Final   Hemoglobin  Date Value Ref Range Status  03/02/2023 13.1 (L) 13.2 - 17.1 g/dL Final  16/12/9602 54.0 13.0 - 17.7 g/dL Final   HCT  Date Value Ref Range Status  03/02/2023 40.7 38.5 - 50.0 % Final   Hematocrit  Date Value Ref Range Status  11/12/2019 42.4 37.5 - 51.0 % Final   MCHC  Date Value Ref Range Status  03/02/2023 32.2 32.0  - 36.0 g/dL Final    Comment:    For adults, a slight decrease in the calculated MCHC value (in the range of 30 to 32 g/dL) is most likely not clinically significant; however, it should be interpreted with caution in correlation with other red cell parameters and the patient's clinical condition.    [facility]  Date Value Ref Range Status  03/02/2023 28.9 27.0 - 33.0 pg Final   MCV  Date Value Ref Range Status  03/02/2023 89.8 80.0 - 100.0 fL Final  11/12/2019 91 79 - 97 fL Final  10/21/2011 90 80 - 100 fL Final   No results found for: "PLTCOUNTKUC", "LABPLAT", "POCPLA" RDW  Date Value Ref Range Status  03/02/2023 13.7 11.0 - 15.0 % Final  11/12/2019 13.8 11.6 - 15.4 % Final  10/21/2011 13.9 11.5 - 14.5 % Final          pantoprazole  (PROTONIX ) 20 MG tablet 180 tablet 1    Sig: TAKE 1 TABLET TWICE DAILY BEFORE MEALS     Gastroenterology: Proton Pump Inhibitors Failed - 09/05/2023 10:43 AM      Failed - Valid encounter within last 12 months    Recent Outpatient Visits   None     Future Appointments             In 1 week Romeo Co, Kayleen Party, DO [place] [facility], [facility]

## 2023-09-06 ENCOUNTER — Other Ambulatory Visit: Payer: Self-pay | Admitting: Family Medicine

## 2023-09-06 ENCOUNTER — Ambulatory Visit: Payer: Self-pay | Admitting: *Deleted

## 2023-09-06 DIAGNOSIS — M109 Gout, unspecified: Secondary | ICD-10-CM

## 2023-09-06 MED ORDER — COLCHICINE 0.6 MG PO TABS: ORAL_TABLET | ORAL | 2 refills | Status: AC

## 2023-09-06 MED ORDER — COLCHICINE 0.6 MG PO TABS: ORAL_TABLET | ORAL | 2 refills | Status: DC

## 2023-09-06 NOTE — Telephone Encounter (Signed)
 Can we call in colchicine  for this patient, having a gout flare up. Not in med list. Uses [facility] [address] [facility].

## 2023-09-06 NOTE — Telephone Encounter (Signed)
 Patient notified prescription sent to pharmacy.

## 2023-09-06 NOTE — Telephone Encounter (Signed)
 Yes I will send Colchicine  to his pharmacy. He has been on it previously, okay to order for acute gout flare.  Domingo Friend, DO [facility] 09/06/2023, 11:09 AM

## 2023-09-06 NOTE — Telephone Encounter (Signed)
 FYI Only or Action Required?: Action required by provider  Patient was last seen in primary care on 03/28/2023 by Raina Bunting, DO. Called Nurse Triage reporting Foot Pain. Symptoms began a week ago. Interventions attempted: allopurinol  (ZYLOPRIM ) 100 MG tablet . Symptoms are: gradually worsening.  Triage Disposition: See HCP Within 4 Hours (Or PCP Triage)  Patient/caregiver understands and will follow disposition?: declines appointment- medication request     Reason for Disposition  [1] SEVERE pain (e.g., excruciating, unable to do any normal activities) AND [2] not improved after 2 hours of pain medicine  Answer Assessment - Initial Assessment Questions Patient states he has Gout flare- is requesting colchicine  for treatment. Patient does have upcoming appointment 09/14/23. Advised I would send request.  1. ONSET: When did the pain start?      Started 1 week 2. LOCATION: Where is the pain located?      Feels like whole foot-toward the toes 3. PAIN: How bad is the pain?    (Scale 1-10; or mild, moderate, severe)  - MILD (1-3): doesn't interfere with normal activities.   - MODERATE (4-7): interferes with normal activities (e.g., work or school) or awakens from sleep, limping.   - SEVERE (8-10): excruciating pain, unable to do any normal activities, unable to walk.      Weightbearing-8/10 4. WORK OR EXERCISE: Has there been any recent work or exercise that involved this part of the body?      Up on feet more recently 5. CAUSE: What do you think is causing the foot pain?     gout 6. OTHER SYMPTOMS: Do you have any other symptoms? (e.g., leg pain, rash, fever, numbness)     Leg weakness  Protocols used: Foot Pain-A-AH     Copied from CRM [id number]. Topic: Clinical - Red Word Triage >> Sep 06, 2023  9:28 AM Hassie Lint wrote: Red Word that prompted transfer to Nurse Triage: Patient states is having a gout flare up. Has swelling and pain in his left foot.

## 2023-09-06 NOTE — Addendum Note (Signed)
 Addended by: Raina Bunting on: 09/06/2023 11:10 AM   Modules accepted: Orders

## 2023-09-08 ENCOUNTER — Other Ambulatory Visit: Payer: Self-pay

## 2023-09-08 DIAGNOSIS — R972 Elevated prostate specific antigen [PSA]: Secondary | ICD-10-CM

## 2023-09-08 DIAGNOSIS — Z794 Long term (current) use of insulin: Secondary | ICD-10-CM

## 2023-09-09 LAB — PSA: PSA: 14.8 ng/mL — ABNORMAL HIGH (ref <=4.00)

## 2023-09-09 LAB — HEMOGLOBIN A1C
Hgb A1c MFr Bld: 8.3 % — ABNORMAL HIGH (ref <5.7)
Mean Plasma Glucose: 192 mg/dL
eAG (mmol/L): 10.6 mmol/L

## 2023-09-11 ENCOUNTER — Other Ambulatory Visit: Payer: Self-pay | Admitting: Family Medicine

## 2023-09-11 DIAGNOSIS — I2510 Atherosclerotic heart disease of native coronary artery without angina pectoris: Secondary | ICD-10-CM

## 2023-09-11 MED ORDER — CLOPIDOGREL BISULFATE 75 MG PO TABS: 75.0000 mg | ORAL_TABLET | Freq: Every day | ORAL | 3 refills | Status: AC

## 2023-09-13 ENCOUNTER — Ambulatory Visit: Payer: Self-pay | Admitting: Family Medicine

## 2023-09-14 ENCOUNTER — Ambulatory Visit (INDEPENDENT_AMBULATORY_CARE_PROVIDER_SITE_OTHER): Payer: Self-pay | Admitting: Family Medicine

## 2023-09-14 ENCOUNTER — Other Ambulatory Visit: Payer: Self-pay | Admitting: Family Medicine

## 2023-09-14 ENCOUNTER — Encounter: Payer: Self-pay | Admitting: Family Medicine

## 2023-09-14 VITALS — BP 120/58 | HR 80 | Ht 71.0 in | Wt 225.0 lb

## 2023-09-14 DIAGNOSIS — R972 Elevated prostate specific antigen [PSA]: Secondary | ICD-10-CM | POA: Diagnosis not present

## 2023-09-14 DIAGNOSIS — Z7984 Long term (current) use of oral hypoglycemic drugs: Secondary | ICD-10-CM

## 2023-09-14 DIAGNOSIS — M1000 Idiopathic gout, unspecified site: Secondary | ICD-10-CM

## 2023-09-14 DIAGNOSIS — I129 Hypertensive chronic kidney disease with stage 1 through stage 4 chronic kidney disease, or unspecified chronic kidney disease: Secondary | ICD-10-CM | POA: Diagnosis not present

## 2023-09-14 DIAGNOSIS — I10 Essential (primary) hypertension: Secondary | ICD-10-CM

## 2023-09-14 DIAGNOSIS — E1169 Type 2 diabetes mellitus with other specified complication: Secondary | ICD-10-CM

## 2023-09-14 DIAGNOSIS — Z794 Long term (current) use of insulin: Secondary | ICD-10-CM

## 2023-09-14 DIAGNOSIS — E1142 Type 2 diabetes mellitus with diabetic polyneuropathy: Secondary | ICD-10-CM

## 2023-09-14 DIAGNOSIS — Z0000 Encounter for general adult medical examination without abnormal findings: Secondary | ICD-10-CM

## 2023-09-14 DIAGNOSIS — N183 Chronic kidney disease, stage 3 unspecified: Secondary | ICD-10-CM

## 2023-09-14 DIAGNOSIS — I2510 Atherosclerotic heart disease of native coronary artery without angina pectoris: Secondary | ICD-10-CM

## 2023-09-14 MED ORDER — LISINOPRIL 40 MG PO TABS: 40.0000 mg | ORAL_TABLET | Freq: Every day | ORAL | 3 refills | Status: AC

## 2023-09-14 MED ORDER — GABAPENTIN 300 MG PO CAPS: 300.0000 mg | ORAL_CAPSULE | Freq: Every day | ORAL | 1 refills | Status: AC

## 2023-09-14 MED ORDER — CARVEDILOL 12.5 MG PO TABS: 12.5000 mg | ORAL_TABLET | Freq: Two times a day (BID) | ORAL | 3 refills | Status: AC

## 2023-09-14 NOTE — Progress Notes (Signed)
 Subjective:    Patient ID: [name], male    DOB: 1950/09/20, 73 y.o.   MRN: [id number]  [name] is a 73 y.o. male presenting on 09/14/2023 for Diabetes and Elevated PSA   HPI  Discussed the use of AI scribe software for clinical note transcription with the patient, who gave verbal consent to proceed.  History of Present Illness   [name] is a 73 year old male with diabetes who presents for follow-up on lab results.  He is here for follow-up on his lab results, specifically concerning his elevated A1c and PSA levels. His A1c is currently 8.3. He experiences fluctuations in his blood sugar levels, with occasional spikes up to 195 mg/dL, particularly after consuming sweets during events like birthday parties. He monitors his blood sugar every morning and is currently on Rybelsus , which he takes on an empty stomach every morning, and insulin  at 37 units. He receives Rybelsus  through a mail service medication assistance covered.  Regarding his PSA levels, he has a history of elevated readings, with a previous high of 26 about 3-4 years ago, which then decreased to between 7 and 10, but has recently increased to 14. He mentions previous with [facility] 2021 past biopsy that led to sepsis infection complication and hospitalization. He prefers to follow up with [facility] in [place] due to a better past experience there. Previously seen 02/2020  He experiences foot pain, which he initially thought was related to gout, as it presented similarly to previous gout episodes. He took colchicine , which provided some relief but also caused gastrointestinal side effects. The pain is described as sometimes numb and sometimes painful, particularly when he is on his feet for extended periods. He has not been using any topical treatments due to difficulty reaching his feet. He describes neuropathy of feet. Previous gabapentin  100mg  ineffective not on recently.  He is  retired, though he mentions still working occasionally. He is planning a camping trip to the beach in the near future. No new symptoms related to his diabetes or prostate issues.          03/10/2023    9:02 AM 09/01/2022   10:26 AM 03/04/2022   10:23 AM  Depression screen PHQ 2/9  Decreased Interest 0 0 0  Down, Depressed, Hopeless 1 0 0  PHQ - 2 Score 1 0 0  Altered sleeping 0 0 0  Tired, decreased energy 1 0 0  Change in appetite 0 0 0  Feeling bad or failure about yourself  0 0 0  Trouble concentrating 0 0 0  Moving slowly or fidgety/restless 0 0 0  Suicidal thoughts 0 0 0  PHQ-9 Score 2 0 0  Difficult doing work/chores Not difficult at all Not difficult at all Not difficult at all       09/01/2022   10:26 AM 03/02/2022   10:34 AM 09/16/2021   10:28 AM 11/10/2020    2:14 PM  GAD 7 : Generalized Anxiety Score  Nervous, Anxious, on Edge 0 0 0 0  Control/stop worrying 0 0 0 0  Worry too much - different things 0 0 0 0  Trouble relaxing 0 0 0 0  Restless 0 0 0 0  Easily annoyed or irritable 0 0 0 0  Afraid - awful might happen 0 0 0 0  Total GAD 7 Score 0 0 0 0  Anxiety Difficulty Not difficult at all Not difficult at all Not difficult at all Not  difficult at all    Social History   Tobacco Use   Smoking status: Former    Current packs/day: 0.00    Types: Cigars, Cigarettes    Quit date: 03/28/1998    Years since quitting: 25.4   Smokeless tobacco: Current    Types: Chew   Tobacco comments:    wife states patient never smoked cigarettes, just chewing tobacco  Vaping Use   Vaping status: Never Used  Substance Use Topics   Alcohol use: Not Currently   Drug use: No    Review of Systems Per HPI unless specifically indicated above     Objective:    BP (!) 120/58 (BP Location: Left Arm, Patient Position: Sitting, Cuff Size: Normal)   Pulse 80   Ht 5' 11 (1.803 m)   Wt 225 lb (102.1 kg)   SpO2 96%   BMI 31.38 kg/m   Wt Readings from Last 3 Encounters:   09/14/23 225 lb (102.1 kg)  03/28/23 221 lb (100.2 kg)  03/10/23 231 lb 9.6 oz (105.1 kg)    Physical Exam Vitals and nursing note reviewed.  Constitutional:      General: He is not in acute distress.    Appearance: Normal appearance. He is well-developed. He is not diaphoretic.     Comments: Well-appearing, comfortable, cooperative  HENT:     Head: Normocephalic and atraumatic.   Eyes:     General:        Right eye: No discharge.        Left eye: No discharge.     Conjunctiva/sclera: Conjunctivae normal.   Neck:     Thyroid: No thyromegaly.   Cardiovascular:     Rate and Rhythm: Normal rate and regular rhythm.     Pulses: Normal pulses.     Heart sounds: Normal heart sounds. No murmur heard. Pulmonary:     Effort: Pulmonary effort is normal. No respiratory distress.     Breath sounds: Normal breath sounds. No wheezing or rales.   Musculoskeletal:        General: Normal range of motion.     Cervical back: Normal range of motion and neck supple.  Lymphadenopathy:     Cervical: No cervical adenopathy.   Skin:    General: Skin is warm and dry.     Findings: No erythema or rash.   Neurological:     Mental Status: He is alert and oriented to person, place, and time. Mental status is at baseline.   Psychiatric:        Mood and Affect: Mood normal.        Behavior: Behavior normal.        Thought Content: Thought content normal.     Comments: Well groomed, good eye contact, normal speech and thoughts     Recent Labs    03/02/23 0824 09/08/23 0809  HGBA1C 8.3* 8.3*     Results for orders placed or performed in visit on 09/08/23  PSA   Collection Time: 09/08/23  8:09 AM  Result Value Ref Range   PSA 14.80 (H) < OR = 4.00 ng/mL  Hemoglobin A1c   Collection Time: 09/08/23  8:09 AM  Result Value Ref Range   Hgb A1c MFr Bld 8.3 (H) <5.7 %   Mean Plasma Glucose 192 mg/dL   eAG (mmol/L) 16.1 mmol/L      Assessment & Plan:   Problem List Items Addressed  This Visit     Elevated PSA, between 10 and less than  20 ng/ml   Relevant Orders   Ambulatory referral to Urology   Essential hypertension   Relevant Medications   lisinopril  (ZESTRIL ) 40 MG tablet   carvedilol  (COREG ) 12.5 MG tablet   Type 2 diabetes mellitus with other specified complication (HCC) - Primary   Relevant Medications   lisinopril  (ZESTRIL ) 40 MG tablet   Other Visit Diagnoses       Benign hypertension with CKD (chronic kidney disease) stage III (HCC)       Relevant Medications   lisinopril  (ZESTRIL ) 40 MG tablet   carvedilol  (COREG ) 12.5 MG tablet     Diabetic peripheral neuropathy (HCC)       Relevant Medications   lisinopril  (ZESTRIL ) 40 MG tablet   gabapentin  (NEURONTIN ) 300 MG capsule     Long term current use of oral hypoglycemic drug         Long term current use of insulin  (HCC)            Elevated PSA PSA increased to 14, indicating potential prostate issues. Previous had be 6-8 range [facility] back to 2021 with Previous biopsy due to PSA of 26. He had complication from prostate biopsy at [facility] with urosepsis infection after biopsy. He then switched to [facility] [place] He Prefers follow-up with [facility]. Specialist evaluation needed to rule out cancer. - Refer to [facility] in [place] for further evaluation again. Prefer prior provider Dr Christain Courser from 02/2020 if available.  Type 2 Diabetes Mellitus A1c at 8.3 indicates suboptimal control. On Rybelsus  and insulin . Discussed potential Mounjaro use if hyperglycemia persists. Emphasized dietary control. - Continue Rybelsus  and insulin  as prescribed. [name]  37 u daily, can titrate up as indicated but ultimately more importantly emphasis on diet lifestyle weight and consider higher dose of Rybelsus  in future or GLP1 injection possibly GLP/GIP Tirzepatide but this would likely be cost prohibitive at this time since he is on PAP for Rybelsus  - Encourage dietary  modifications.  Foot Pain and Possible Neuropathy Foot pain, numbness, and puffy sensation suggest neuropathy. Discussed gabapentin  for management. - Prescribe gabapentin  300 mg at bedtime. Note past failed 100mg  dose in past. - Consider Voltaren  cream for topical pain relief. - Continue colchicine  as needed for gout.        CC Chart to clinical pharmacy [facility].   Orders Placed This Encounter  Procedures   Ambulatory referral to Urology    Referral Priority:   Routine    Referral Type:   Consultation    Referral Reason:   Specialty Services Required    Requested Specialty:   Urology    Number of Visits Requested:   1    Meds ordered this encounter  Medications   lisinopril  (ZESTRIL ) 40 MG tablet    Sig: Take 1 tablet (40 mg total) by mouth daily.    Dispense:  90 tablet    Refill:  3   carvedilol  (COREG ) 12.5 MG tablet    Sig: Take 1 tablet (12.5 mg total) by mouth 2 (two) times daily.    Dispense:  180 tablet    Refill:  3   gabapentin  (NEURONTIN ) 300 MG capsule    Sig: Take 1 capsule (300 mg total) by mouth at bedtime.    Dispense:  90 capsule    Refill:  1    Follow up plan: Return for 6 month fasting lab > 1 week later Annual Physical.  Future labs ordered for 03/11/24   Domingo Friend, DO [facility]  [facility] 09/14/2023, 10:24 AM

## 2023-09-14 NOTE — Patient Instructions (Addendum)
 Thank you for coming to the office today.  Recent Labs    03/02/23 0824 09/08/23 0809  HGBA1C 8.3* 8.3*   Keep on Rybelsus  14mg  daily since it is covered.  We can consider upgrade to the weekly shot in the future if interested or covered  Or we can dose increase to 25-50mg  in future if they have it available.   -----------  We will refer you back to Urology for elevated PSA 14  [facility] Address: [address] [place], [place] [address] Phone: [phone]  Dr Karron Pagan  ----------------   START anti inflammatory topical - OTC Voltaren  (generic Diclofenac ) topical 2-4 times a day as needed for pain swelling of affected joint for 1-2 weeks or longer.   Please schedule a Follow-up Appointment to: Return for 6 month fasting lab > 1 week later Annual Physical.  If you have any other questions or concerns, please feel free to call the office or send a message through MyChart. You may also schedule an earlier appointment if necessary.  Additionally, you may be receiving a survey about your experience at our office within a few days to 1 week by e-mail or mail. We value your feedback.  Domingo Friend, DO [facility], [place]

## 2023-09-26 ENCOUNTER — Telehealth: Payer: Self-pay

## 2023-09-26 NOTE — Telephone Encounter (Signed)
 Left message for patient to return call OK to advise  Rybelsus  was delivered to the office he may come by and pick up

## 2023-09-29 ENCOUNTER — Other Ambulatory Visit: Payer: Self-pay | Admitting: Family Medicine

## 2023-09-29 DIAGNOSIS — I2510 Atherosclerotic heart disease of native coronary artery without angina pectoris: Secondary | ICD-10-CM

## 2023-09-29 DIAGNOSIS — E1169 Type 2 diabetes mellitus with other specified complication: Secondary | ICD-10-CM

## 2023-10-03 NOTE — Telephone Encounter (Signed)
 Requested Prescriptions  Pending Prescriptions Disp Refills   REPATHA  SURECLICK 140 MG/ML SOAJ [Pharmacy Med Name: Repatha  SureClick 140 MG/ML Subcutaneous Solution Auto-injector] 6 mL 0    Sig: INJECT 140 MG INTO THE SKIN EVERY 14 DAYS     Cardiovascular: PCSK9 Inhibitors Passed - 10/03/2023 11:13 AM      Passed - Valid encounter within last 12 months    Recent Outpatient Visits           2 weeks ago Type 2 diabetes mellitus with other specified complication, with long-term current use of insulin  [facility])   [place], Marsa PARAS, DO              Passed - Lipid Panel completed within the last 12 months    Cholesterol, Total  Date Value Ref Range Status  12/28/2022 120 100 - 199 mg/dL Final   Cholesterol  Date Value Ref Range Status  03/02/2023 116 <200 mg/dL Final   LDL Cholesterol (Calc)  Date Value Ref Range Status  03/02/2023 57 mg/dL (calc) Final    Comment:    Reference range: <100 . Desirable range <100 mg/dL for primary prevention;   <70 mg/dL for patients with CHD or diabetic patients  with > or = 2 CHD risk factors. SABRA LDL-C is now calculated using the Martin-Hopkins  calculation, which is a validated novel method providing  better accuracy than the Friedewald equation in the  estimation of LDL-C.  Gladis APPLETHWAITE et al. SANDREA. 7986;689(80): 2061-2068  (http://education.Quest[employer].com/faq/FAQ164)    HDL  Date Value Ref Range Status  03/02/2023 32 (L) > OR = 40 mg/dL Final  89/97/7975 31 (L) >39 mg/dL Final   Triglycerides  Date Value Ref Range Status  03/02/2023 196 (H) <150 mg/dL Final

## 2023-10-18 ENCOUNTER — Other Ambulatory Visit: Payer: Self-pay | Admitting: Urology

## 2023-10-18 DIAGNOSIS — R972 Elevated prostate specific antigen [PSA]: Secondary | ICD-10-CM

## 2023-10-19 ENCOUNTER — Encounter: Payer: Self-pay | Admitting: Urology

## 2023-10-23 ENCOUNTER — Other Ambulatory Visit (INDEPENDENT_AMBULATORY_CARE_PROVIDER_SITE_OTHER): Admitting: Pharmacist

## 2023-10-23 DIAGNOSIS — E1169 Type 2 diabetes mellitus with other specified complication: Secondary | ICD-10-CM

## 2023-10-23 DIAGNOSIS — I129 Hypertensive chronic kidney disease with stage 1 through stage 4 chronic kidney disease, or unspecified chronic kidney disease: Secondary | ICD-10-CM

## 2023-10-23 DIAGNOSIS — Z794 Long term (current) use of insulin: Secondary | ICD-10-CM

## 2023-10-23 DIAGNOSIS — I2510 Atherosclerotic heart disease of native coronary artery without angina pectoris: Secondary | ICD-10-CM

## 2023-10-23 NOTE — Progress Notes (Signed)
 10/23/2023 Name: [name] MRN: [id number] DOB: 1950/06/11  Chief Complaint  Patient presents with   Medication Management   Medication Assistance    [name] is a 73 y.o. year old male who presented for a telephone visit.   They were referred to the pharmacist by their PCP for assistance in managing diabetes, hypertension, hyperlipidemia, and medication access.      Subjective:   Care Team: Primary Care Provider: Edman Marsa PARAS, DO ; Next Scheduled Visit: 03/19/2024 Cardiologist: Darliss Rogue, MD Nephrologist: Dennise Capri, MD  ; Next Scheduled Visit: 04/09/2024 Urology: [facility]  Medication Access/Adherence  Current Pharmacy:  [facility] [address], [place] - [address] [place] [address] Phone: [phone] Fax: [phone]  [facility] - [place], [place] - [address] [place] [address] Phone: [phone] Fax: [phone]  [facility] [place], [place] - [address] [place] [address] Phone: [phone] Fax: [phone]   Patient reports affordability concerns with their medications: No  Patient reports access/transportation concerns to their pharmacy: No  Patient reports adherence concerns with their medications:  No     Patient using weekly pillbox - Admits may have missed [name]  dose ~2 times over past month   Diabetes:   Current medications:  Rybelsus  14 mg daily Taking >=30 minutes before the first food, beverage, or other oral medications of the day with <=4 oz of plain water   Insulin  glargine ([name] /Lantus ) 37 units QHS   Medications tried in the past:  Trulicity  (GI intolerance) Metformin  (unable to tolerate - GI side effects) Bydureon  (difficulty with device/administration) Invokana  (felt dehydrated)   Current glucose readings: morning fasting  ranging 116-179 ; today 130 - Attributes higher readings to portion sizes with supper - Also thinks may have missed [name]  dose a couple of times (on evenings prior to higher readings)    Patient denies hypoglycemic s/sx including dizziness, shakiness, sweating              Current physical activity: reports typically stays active throughout the day working and walking some at home     Current medication access support:  - Enrolled in patient assistance for for [name]  from [name] and Rybelsus  from [employer] patient assistance programs through 03/27/2024     Hypertension:   Current medications:  Carvedilol  12.5 mg twice daily Lisinopril  40 mg daily  Imdur  30 mg -1/2 tablet (15 mg) daily   Patient has an automated, upper arm home BP cuff Reports blood pressure this morning 132/80, HR 70   Denies symptoms of hypotension   Current physical activity: reports typically stays active throughout the day working and walking some at home     Hyperlipidemia/hx of CAD/NSTEMI:   Current lipid lowering medications: Repatha  140 mg every 14 days             Reports tolerating well   Previous therapies tried: atorvastatin  40 mg daily & 10 mg daily (unable to tolerate due to myalgia), ezetimibe     Have discussed importance of limiting saturated fats and trans fats in diet   Antiplatelet regimen: Aspirin  81 mg daily Clopidogrel  75 mg daily   Current physical activity: reports typically stays active throughout the day working and walking some at home   Current medication access support: Enrolled in [employer] Hypercholesterolemia - Medicare Access grant through 03/07/2024   Objective:  Lab Results  Component  Value Date   HGBA1C 8.3 (H) 09/08/2023    Lab Results  Component Value Date   CREATININE 1.49 (H) 03/02/2023   BUN 19 03/02/2023   NA 138 03/02/2023   K 4.6 03/02/2023   CL 102 03/02/2023   CO2 29 03/02/2023    Lab Results  Component Value Date   CHOL 116  03/02/2023   HDL 32 (L) 03/02/2023   LDLCALC 57 03/02/2023   TRIG 196 (H) 03/02/2023   CHOLHDL 3.6 03/02/2023   BP Readings from Last 3 Encounters:  09/14/23 (!) 120/58  03/28/23 116/64  03/10/23 122/74   Pulse Readings from Last 3 Encounters:  09/14/23 80  03/28/23 79  03/10/23 82     Medications Reviewed Today     Reviewed by Alana Sharyle LABOR, RPH-CPP (Pharmacist) on 10/23/23 at 0859  Med List Status: <None>   Medication Order Taking? Sig Documenting Provider Last Dose Status Informant  ACCU-CHEK SMARTVIEW test strip [id number]  TEST BLOOD SUGAR TWO TIMES DAILY Edman Marsa PARAS, DO  Active   acetaminophen  (TYLENOL ) 500 MG tablet [id number]  Take 1,000 mg by mouth every 8 (eight) hours as needed.  [provider]  Active Spouse/Significant Other, Pharmacy Records  allopurinol  (ZYLOPRIM ) 100 MG tablet [id number]  TAKE 1 TABLET AT BEDTIME Edman Marsa PARAS, DO  Active   aspirin  EC 81 MG EC tablet [id number]  Take 1 tablet (81 mg total) by mouth daily. Swallow whole. Josette Ade, MD  Active Spouse/Significant Other, Pharmacy Records  carvedilol  (COREG ) 12.5 MG tablet [id number] Yes Take 1 tablet (12.5 mg total) by mouth 2 (two) times daily. Edman Marsa PARAS, DO  Active   clopidogrel  (PLAVIX ) 75 MG tablet [id number]  Take 1 tablet (75 mg total) by mouth daily. Edman Marsa PARAS, DO  Active   colchicine  0.6 MG tablet [id number]  Take for gout flare start Day 1 with 2 tablets then may take 1 extra tablet in 1 hour. Starting Day 2 take 1 tablet daily for up to 7-10 days until resolve Edman Marsa PARAS, DO  Active   fluticasone  (FLONASE ) 50 MCG/ACT nasal spray [id number]  USE 2 SPRAYS IN EACH NOSTRIL ONCE DAILY - USE FOR 4 TO 6 WEEKS, THEN STOP AND USE SEASONALLY OR AS NEEDED. Edman Marsa PARAS, DO  Active   gabapentin  (NEURONTIN ) 300 MG capsule [id number]  Take 1 capsule (300 mg total) by mouth at bedtime. Edman Marsa PARAS, DO   Active   Insulin  Glargine ([name]  KWIKPEN) 100 UNIT/ML [id number] Yes Inject 37 Units into the skin at bedtime. Max daily dose 37 units Karamalegos, Marsa PARAS, DO  Active   Insulin  Pen Needle (DROPLET PEN NEEDLES) 32G X 4 MM MISC [id number]  USE WITH [name]  INSULIN  INJECTION DAILY AS INSTRUCTED Edman Marsa PARAS, DO  Active   isosorbide  mononitrate (IMDUR ) 30 MG 24 hr tablet [id number] Yes Take 0.5 tablets (15 mg total) by mouth daily. Darliss Rogue, MD  Active   lisinopril  (ZESTRIL ) 40 MG tablet [id number] Yes Take 1 tablet (40 mg total) by mouth daily. Edman Marsa PARAS, DO  Active   nitroGLYCERIN  (NITROSTAT ) 0.4 MG SL tablet [id number]  Place 1 tablet (0.4 mg total) under the tongue every 5 (five) minutes as needed for chest pain. Edman Marsa PARAS, DO  Active   ondansetron  (ZOFRAN -ODT) 4 MG disintegrating tablet [id number]  Take 1 tablet (4 mg total) by mouth every 8 (eight) hours as needed for nausea or vomiting. Karamalegos, Marsa PARAS, DO  Active   pantoprazole  (PROTONIX ) 20  MG tablet [id number]  TAKE 1 TABLET TWICE DAILY BEFORE MEALS Edman Marsa PARAS, DO  Active   REPATHA  SURECLICK 140 MG/ML SOAJ [id number]  INJECT 140 MG INTO THE SKIN EVERY 14 DAYS Edman Marsa PARAS, DO  Active   RYBELSUS  14 MG TABS [id number] Yes Take 1 tablet (14 mg total) by mouth daily before breakfast. Edman Marsa PARAS, DO  Active Spouse/Significant Other, Pharmacy Records           Med Note [place], [facility]   Wed Oct 13, 2021  8:56 AM)                Assessment/Plan:   Discuss strategies to aid with adherence to daily [name]  injections  Diabetes: - Reviewed goal A1c, goal fasting, and goal 2 hour post prandial glucose - Reviewed dietary modifications including having well-balanced meals throughout the day while controlling carbohydrate portion sizes and limiting nighttime snacking             Patient planning to work on reducing supper portion sizes -  Recommend to monitor home blood sugar, keep log of results and have this record to review during appointments, but to contact office or CM Pharmacist sooner for readings outside of established parameters or for symptoms - Discuss benefits of continuous glucose monitoring to aid with blood sugar control, but patient declines CGM at this time - Discuss diabetes medication management options. Patient agreeable to plan to switch from oral Rybelsus  to once weekly Ozempic  injection for greater impact on blood sugar control  Will collaborate with PCP - Patient to contact [name] patient assistance as needed for refills of [name]  and Novo Nordisk patient assistance as needed for refills of Rybelsus    Hypertension: - Reviewed long term cardiovascular and renal outcomes of uncontrolled blood pressure - Reviewed appropriate blood pressure monitoring technique and reviewed goal blood pressure.  - Encourage dietary changes, including limiting salt/sodium intake and increasing intake of fruit and vegetables - Recommended to check home blood pressure and heart rate and keep a log of the results and to contact office for readings outside of established parameters   Hyperlipidemia: - Encourage patient to continue taking Repatha  as directed as tolerating well     Follow Up Plan: Clinical Pharmacist will follow up with patient by telephone again within the next 30 days      Sharyle Sia, PharmD, JAQUELINE, CPP Clinical Pharmacist [facility] [phone]

## 2023-10-23 NOTE — Patient Instructions (Signed)
Goals Addressed             This Visit's Progress    Pharmacy - Patient Goals       Our goal A1c is less than 7%. This corresponds with fasting sugars less than 130 and 2 hour after meal sugars less than 180. Please check your fasting blood sugar each morning and keep a record of the results  Check your blood pressure once daily, and any time you have concerning symptoms like headache, chest pain, dizziness, shortness of breath, or vision changes.   Our goal is less than 130/80.  To appropriately check your blood pressure, make sure you do the following:  1) Avoid caffeine, exercise, or tobacco products for 30 minutes before checking. Empty your bladder. 2) Sit with your back supported in a flat-backed chair. Rest your arm on something flat (arm of the chair, table, etc). 3) Sit still with your feet flat on the floor, resting, for at least 5 minutes.  4) Check your blood pressure. Take 1-2 readings.  5) Write down these readings and bring with you to any provider appointments.  Bring your home blood pressure machine with you to a provider's office for accuracy comparison at least once a year.   Make sure you take your blood pressure medications before you come to any office visit, even if you were asked to fast for labs.   Feel free to call me with any questions or concerns. I look forward to our next call  Wallace Cullens, PharmD, [facility] [place] [phone]

## 2023-10-25 ENCOUNTER — Telehealth: Payer: Self-pay

## 2023-10-25 NOTE — Telephone Encounter (Signed)
   Pre-operative Risk Assessment    Patient Name: [name]  DOB: 1951/02/01 MRN: [id number]   Date of last office visit: 12/05/22 CAMPBELL CAVE, MD Date of next office visit: NONE   Request for Surgical Clearance    Procedure:  BIOPSY  Date of Surgery:  Clearance TBD                                Surgeon:  DR LONZO NOTTINGHAM Surgeon's Group or Practice Name:  [facility] Phone number:  [phone] Fax number:  [phone]   Type of Clearance Requested:   - Medical  - Pharmacy:  Hold Aspirin  and Clopidogrel  (Plavix ) 5 DAYS PRIOR   Type of Anesthesia:  Not Indicated   Additional requests/questions:    Signed, Lucie DELENA Ku   10/25/2023, 5:07 PM

## 2023-10-26 ENCOUNTER — Telehealth: Payer: Self-pay

## 2023-10-26 ENCOUNTER — Inpatient Hospital Stay: Admission: RE | Admit: 2023-10-26 | Source: Ambulatory Visit

## 2023-10-26 NOTE — Telephone Encounter (Signed)
 Patient scheduled for televisit med rec and consent done     Patient Consent for Virtual Visit         [name] has provided verbal consent on 10/26/2023 for a virtual visit (video or telephone).   CONSENT FOR VIRTUAL VISIT FOR:  [name]  By participating in this virtual visit I agree to the following:  I hereby voluntarily request, consent and authorize [facility] and its employed or contracted physicians, physician assistants, nurse practitioners or other licensed health care professionals (the Practitioner), to provide me with telemedicine health care services (the "Services) as deemed necessary by the treating Practitioner. I acknowledge and consent to receive the Services by the Practitioner via telemedicine. I understand that the telemedicine visit will involve communicating with the Practitioner through live audiovisual communication technology and the disclosure of certain medical information by electronic transmission. I acknowledge that I have been given the opportunity to request an in-person assessment or other available alternative prior to the telemedicine visit and am voluntarily participating in the telemedicine visit.  I understand that I have the right to withhold or withdraw my consent to the use of telemedicine in the course of my care at any time, without affecting my right to future care or treatment, and that the Practitioner or I may terminate the telemedicine visit at any time. I understand that I have the right to inspect all information obtained and/or recorded in the course of the telemedicine visit and may receive copies of available information for a reasonable fee.  I understand that some of the potential risks of receiving the Services via telemedicine include:  Delay or interruption in medical evaluation due to technological equipment failure or disruption; Information transmitted may not be sufficient (e.g. poor resolution of images)  to allow for appropriate medical decision making by the Practitioner; and/or  In rare instances, security protocols could fail, causing a breach of personal health information.  Furthermore, I acknowledge that it is my responsibility to provide information about my medical history, conditions and care that is complete and accurate to the best of my ability. I acknowledge that Practitioner's advice, recommendations, and/or decision may be based on factors not within their control, such as incomplete or inaccurate data provided by me or distortions of diagnostic images or specimens that may result from electronic transmissions. I understand that the practice of medicine is not an exact science and that Practitioner makes no warranties or guarantees regarding treatment outcomes. I acknowledge that a copy of this consent can be made available to me via my patient portal [facility] MyChart), or I can request a printed copy by calling the office of  [facility].    I understand that my insurance will be billed for this visit.   I have read or had this consent read to me. I understand the contents of this consent, which adequately explains the benefits and risks of the Services being provided via telemedicine.  I have been provided ample opportunity to ask questions regarding this consent and the Services and have had my questions answered to my satisfaction. I give my informed consent for the services to be provided through the use of telemedicine in my medical care

## 2023-10-26 NOTE — Telephone Encounter (Signed)
 Preoperative team,  Please contact requesting office and let them know that we will need to know what type of biopsy has been performed.  Once details have been updated we will be able to provide recommendations from a cardiac standpoint.  Thank you for your help.  Josefa HERO. Merilynn Haydu NP-C     10/26/2023, 8:34 AM [facility] [address] Office [phone] Fax [phone]

## 2023-10-26 NOTE — Telephone Encounter (Signed)
   Name: [name]  DOB: 05/13/1950  MRN: [id number]  Primary Cardiologist: Redell Cave, MD   Preoperative team, please contact this patient and set up a phone call appointment for further preoperative risk assessment. Please obtain consent and complete medication review. Thank you for your help.  I confirm that guidance regarding antiplatelet and oral anticoagulation therapy has been completed and, if necessary, noted below.  His aspirin  and Plavix  may be held for 5 days prior to his procedure.  Please resume as soon as hemostasis is achieved at the discretion of the surgeon.  I also confirmed the patient resides in the state of [place] . As per [facility] Medical Board telemedicine laws, the patient must reside in the state in which the provider is licensed.   [name], NP 10/26/2023, 4:11 PM [facility]

## 2023-10-26 NOTE — Telephone Encounter (Signed)
 Spoke to surgeon's office, the patient will have an MRI prostate biopsy. She was unsure of the actual date of the procedure if it was today. I left a message with the scheduler.

## 2023-10-26 NOTE — Telephone Encounter (Signed)
 Patient has been scheduled for televisit med rec and consent done

## 2023-10-26 NOTE — Telephone Encounter (Signed)
 Dr. Darliss,  Mr. Laws requesting preoperative cardiac evaluation for prostate biopsy.  Procedure has not yet been scheduled.  He was last seen in clinic on 12/05/2022.  He was stable from a cardiac standpoint at that time.  He underwent stenting to his mid LAD 8/23 and RCA 10/21.  His PMH also includes hypertension, hyperlipidemia, CKD stage III, GERD, PE.     Lexiscan  Myoview  05/11/2021 fixed apical defect, no evidence for ischemia.  Low risk study Echocardiogram 12/2019, normal ejection fraction 60 to 65%. Left heart cath 12/30/2019 proximal RCA 90%, proximal LAD 40%, moderate left circumflex disease.  Status post successful angioplasty and drug-eluting stent placement to proximal RCA.   May his aspirin  and Plavix  be held prior to his procedure?  Thank you for your help.  Please direct your response to CV DIV preop pool.  Josefa HERO. Ashtin Rosner NP-C     10/26/2023, 12:45 PM [facility] [address] Office [phone] Fax [phone]

## 2023-10-26 NOTE — Telephone Encounter (Signed)
 Requesting office called back to inform patient is getting a fusion prostate biopsy procedure is not scheduled yet but they are holding a spot for him for 11/07/23 and they need to know if patient can hold aspirin  for 5 days and Plavix  for 5 days as well please advise

## 2023-10-27 ENCOUNTER — Encounter: Payer: Self-pay | Admitting: Pharmacist

## 2023-10-27 ENCOUNTER — Other Ambulatory Visit: Admitting: Pharmacist

## 2023-10-27 DIAGNOSIS — Z794 Long term (current) use of insulin: Secondary | ICD-10-CM

## 2023-10-27 DIAGNOSIS — E1169 Type 2 diabetes mellitus with other specified complication: Secondary | ICD-10-CM

## 2023-10-27 NOTE — Patient Instructions (Signed)
 Please STOP taking Rybelsus .    On the next day, you can START Ozempic  0.5 mg once WEEKLY. This medication may cause stomach upset, queasiness, or constipation, especially when first starting. This generally improves over time. Call our office if these symptoms occur and worsen, or if you have severe symptoms such as vomiting, diarrhea, or stomach pain.    The following is the web address for the how to use video from the manufacturer:   https://www.ozempic .com/how-to-take/ozempic -pen.html   Please review this video and contact either me or the office with any questions prior to getting started.   Thank you!   Sharyle Sia, PharmD, JAQUELINE, CPP Clinical Pharmacist [facility] [phone]

## 2023-10-27 NOTE — Progress Notes (Signed)
   10/27/2023  Patient ID: Vinie Sherwood Lent, male   DOB: 11-14-1950, 73 y.o.   MRN: [id number]  Collaborated with PCP regarding plan to switch patient from oral Rybelsus  to once weekly Ozempic  injection for greater impact on blood sugar control, as discussed with patient Provider agrees and advises sample of Ozempic  available in office for patient to get started with change from Rybelsus  to Ozempic  0.5 mg once weekly  Attempt to reach patient by telephone today. Reach patient's wife, Dalen [place] (listed on DPR)  Provide update to spouse regarding availability of Ozempic  sample in office and plan for patient to STOP Rybelsus  and then next day START Ozempic  0.5 mg once WEEKLY. - Spouse writes down this information and states patient will come by office to pick up Ozempic  sample either today or Monday - Counsel on Ozempic  pen/administration technique  Will send patient web address for how to use video from Ozempic  manufacturer to review prior to starting this new medication via MyChart.  Follow Up Plan: Clinical Pharmacist will follow up with patient by telephone on 11/10/2023 at 8:30 AM       Sharyle Sia, PharmD, JAQUELINE, CPP Clinical Pharmacist [facility] [place] [phone]

## 2023-11-01 ENCOUNTER — Ambulatory Visit: Attending: Cardiology

## 2023-11-01 ENCOUNTER — Ambulatory Visit
Admission: RE | Admit: 2023-11-01 | Discharge: 2023-11-01 | Disposition: A | Discharge status: Home / Self Care | Source: Ambulatory Visit | Attending: Urology | Admitting: Urology

## 2023-11-01 DIAGNOSIS — Z01810 Encounter for preprocedural cardiovascular examination: Secondary | ICD-10-CM | POA: Diagnosis not present

## 2023-11-01 DIAGNOSIS — R972 Elevated prostate specific antigen [PSA]: Secondary | ICD-10-CM | POA: Diagnosis not present

## 2023-11-01 MED ORDER — GADOPICLENOL 0.5 MMOL/ML IV SOLN
10.0000 mL | Freq: Once | INTRAVENOUS | Status: AC | PRN
  Administered 2023-11-01: 10 mL via INTRAVENOUS

## 2023-11-01 NOTE — Progress Notes (Signed)
 Virtual Visit via Telephone Note   Because of Zeph Riebel co-morbid illnesses, he is at least at moderate risk for complications without adequate follow up.  This format is felt to be most appropriate for this patient at this time.  Due to technical limitations with video connection (technology), today's appointment will be conducted as an audio only telehealth visit, and Kairav Russomanno verbally agreed to proceed in this manner.   All issues noted in this document were discussed and addressed.  No physical exam could be performed with this format.  Evaluation Performed:  Preoperative cardiovascular risk assessment _____________   Date:  11/01/2023   Patient ID:  [name], DOB 1950-11-14, MRN [id number] Patient Location:  Home Provider location:   Office  Primary Care Provider:  Edman Marsa PARAS, DO Primary Cardiologist:  Redell Cave, MD  Chief Complaint / Patient Profile   73 y.o. y/o male with a h/o CAD/NSTEMI (PCI/DES to M LAD 10/2021, RCA 12/2019), hypertension, hyperlipidemia, CKD stage III, GERD, PE, chewing tobacco who is pending biopsy and presents today for telephonic preoperative cardiovascular risk assessment.  History of Present Illness    Argelio Granier is a 73 y.o. male who presents via audio/video conferencing for a telehealth visit today.  Pt was last seen in cardiology clinic on 12/05/2022 by Dr. Holley At that time Kalib Bhagat was doing well.  The patient is now pending procedure as outlined above. Since his last visit, he will be having a prostate biopsy. No chest pains or SOB. He has been doing pretty good. He is still working and does real estate   His aspirin  and Plavix  may be held for 5 days prior to his procedure. Please resume as soon as hemostasis is achieved at the discretion of the surgeon.    Past Medical History    Past Medical History:  Diagnosis Date   Allergy    CAD (coronary artery disease)    a.  01/2019 MV: EF 55-65%, no ischemia; b. 12/2019 NSTEMI/PCI: LM nl, LAD 40p, 37m, D1 40, LCX nl, OM3 40, RCA 90/85p (2.75x34 Resolute Onyx DES), 63m/d.   Cataract 2023   removed in 2023   Chronic headaches    CKD (chronic kidney disease), stage III (HCC)    Edentulous    no lower teeth   GERD (gastroesophageal reflux disease)    Gout    Heart burn    History of echocardiogram    a. 12/2019 Echo: EF 60-65%, no rwma, triv MR.   Hyperlipidemia    Hypertension    Motion sickness    boats   NSTEMI (non-ST elevated myocardial infarction) (HCC) 12/27/2019   Pulmonary embolism (HCC) 05/2017   Type 2 diabetes mellitus [facility])    Past Surgical History:  Procedure Laterality Date   BACK SURGERY  02/26/2019   CATARACT EXTRACTION W/PHACO Left 04/14/2021   Procedure: CATARACT EXTRACTION PHACO AND INTRAOCULAR LENS PLACEMENT (IOC) LEFT DIABETIC maylugin;  Surgeon: Mittie Gaskin, MD;  Location: [facility];  Service: Ophthalmology;  Laterality: Left;  11.22 1:25.1   CATARACT EXTRACTION W/PHACO Right 06/09/2021   Procedure: CATARACT EXTRACTION PHACO AND INTRAOCULAR LENS PLACEMENT (IOC) RIGHT DIABETIC MALYUGIN;  Surgeon: Mittie Gaskin, MD;  Location: [facility];  Service: Ophthalmology;  Laterality: Right;  Diabetic 4.75 01:05.7   CORONARY STENT INTERVENTION N/A 10/26/2021   Procedure: CORONARY STENT INTERVENTION;  Surgeon: Mady Bruckner, MD;  Location: [facility];  Service: Cardiovascular;  Laterality: N/A;   CORONARY  ULTRASOUND/IVUS N/A 10/26/2021   Procedure: Intravascular Ultrasound/IVUS;  Surgeon: Mady Bruckner, MD;  Location: [facility];  Service: Cardiovascular;  Laterality: N/A;   EYE SURGERY  see above comment   KNEE SURGERY     LEFT HEART CATH AND CORONARY ANGIOGRAPHY N/A 12/30/2019   Procedure: LEFT HEART CATH AND CORONARY ANGIOGRAPHY;  Surgeon: Darron Deatrice LABOR, MD;  Location: [facility];  Service: Cardiovascular;   Laterality: N/A;   LEFT HEART CATH AND CORONARY ANGIOGRAPHY N/A 10/26/2021   Procedure: LEFT HEART CATH AND CORONARY ANGIOGRAPHY;  Surgeon: Mady Bruckner, MD;  Location: [facility];  Service: Cardiovascular;  Laterality: N/A;   SHOULDER SURGERY     SPINE SURGERY  02/26/2019   TONSILLECTOMY      Allergies  Allergies  Allergen Reactions   Crestor  [Rosuvastatin  Calcium ] Other (See Comments)    myalgia   Ciprofloxacin     Invokana  [Canagliflozin ] Other (See Comments)    dehydrated   Prednisone Other (See Comments)   Sulfamethizole     Home Medications    Prior to Admission medications   Medication Sig Start Date End Date Taking? Authorizing Provider  ACCU-CHEK SMARTVIEW test strip TEST BLOOD SUGAR TWO TIMES DAILY 12/28/21   Edman, Marsa PARAS, DO  acetaminophen  (TYLENOL ) 500 MG tablet Take 1,000 mg by mouth every 8 (eight) hours as needed.     [provider]  allopurinol  (ZYLOPRIM ) 100 MG tablet TAKE 1 TABLET AT BEDTIME 09/05/23   Karamalegos, Marsa PARAS, DO  aspirin  EC 81 MG EC tablet Take 1 tablet (81 mg total) by mouth daily. Swallow whole. 12/31/19   Josette Ade, MD  carvedilol  (COREG ) 12.5 MG tablet Take 1 tablet (12.5 mg total) by mouth 2 (two) times daily. 09/14/23   Karamalegos, Marsa PARAS, DO  clopidogrel  (PLAVIX ) 75 MG tablet Take 1 tablet (75 mg total) by mouth daily. 09/11/23   Karamalegos, Marsa PARAS, DO  colchicine  0.6 MG tablet Take for gout flare start Day 1 with 2 tablets then may take 1 extra tablet in 1 hour. Starting Day 2 take 1 tablet daily for up to 7-10 days until resolve 09/06/23   Edman, Marsa PARAS, DO  fluticasone  (FLONASE ) 50 MCG/ACT nasal spray USE 2 SPRAYS IN EACH NOSTRIL ONCE DAILY - USE FOR 4 TO 6 WEEKS, THEN STOP AND USE SEASONALLY OR AS NEEDED. 12/28/21   Edman, Marsa PARAS, DO  gabapentin  (NEURONTIN ) 300 MG capsule Take 1 capsule (300 mg total) by mouth at bedtime. 09/14/23   Karamalegos, Marsa PARAS, DO   Insulin  Glargine ([name]  KWIKPEN) 100 UNIT/ML Inject 37 Units into the skin at bedtime. Max daily dose 37 units 06/28/23   Karamalegos, Marsa PARAS, DO  Insulin  Pen Needle (DROPLET PEN NEEDLES) 32G X 4 MM MISC USE WITH [name]  INSULIN  INJECTION DAILY AS INSTRUCTED 08/18/23   Edman, Marsa PARAS, DO  isosorbide  mononitrate (IMDUR ) 30 MG 24 hr tablet Take 0.5 tablets (15 mg total) by mouth daily. 08/10/23   Darliss Rogue, MD  lisinopril  (ZESTRIL ) 40 MG tablet Take 1 tablet (40 mg total) by mouth daily. 09/14/23   Karamalegos, Marsa PARAS, DO  nitroGLYCERIN  (NITROSTAT ) 0.4 MG SL tablet Place 1 tablet (0.4 mg total) under the tongue every 5 (five) minutes as needed for chest pain. 09/01/22   Karamalegos, Marsa PARAS, DO  ondansetron  (ZOFRAN -ODT) 4 MG disintegrating tablet Take 1 tablet (4 mg total) by mouth every 8 (eight) hours as needed for nausea or vomiting. 03/28/23   Edman Marsa PARAS, DO  pantoprazole  (PROTONIX ) 20 MG tablet TAKE 1 TABLET TWICE DAILY BEFORE MEALS 09/05/23   Edman, Marsa PARAS, DO  REPATHA  SURECLICK 140 MG/ML SOAJ INJECT 140 MG INTO THE SKIN EVERY 14 DAYS 10/03/23   Edman Marsa PARAS, DO  RYBELSUS  14 MG TABS Take 1 tablet (14 mg total) by mouth daily before breakfast. 06/09/21   Edman Marsa PARAS, DO    Physical Exam    Vital Signs:  Timo Hartwig does not have vital signs available for review today.  Given telephonic nature of communication, physical exam is limited. AAOx3. NAD. Normal affect.  Speech and respirations are unlabored.  Accessory Clinical Findings    None  Assessment & Plan    1.  Preoperative Cardiovascular Risk Assessment:  Mr. Blackerby perioperative risk of a major cardiac event is 6.6% according to the Revised Cardiac Risk Index (RCRI).  Therefore, he is at high risk for perioperative complications.   His functional capacity is good at 7.28 METs according to the Duke Activity Status Index  (DASI). Recommendations: According to ACC/AHA guidelines, no further cardiovascular testing needed.  The patient may proceed to surgery at acceptable risk.   Antiplatelet and/or Anticoagulation Recommendations: Clopidogrel  (Plavix ) can be held for 5 days prior to his surgery and resumed as soon as possible post op.  The patient was advised that if he develops new symptoms prior to surgery to contact our office to arrange for a follow-up visit, and he verbalized understanding.   A copy of this note will be routed to requesting surgeon.  Time:   Today, I have spent 8 minutes with the patient with telehealth technology discussing medical history, symptoms, and management plan.     Orren LOISE Fabry, PA-C  11/01/2023, 12:47 PM

## 2023-11-07 DIAGNOSIS — R972 Elevated prostate specific antigen [PSA]: Secondary | ICD-10-CM | POA: Diagnosis not present

## 2023-11-07 DIAGNOSIS — C61 Malignant neoplasm of prostate: Secondary | ICD-10-CM | POA: Diagnosis not present

## 2023-11-10 ENCOUNTER — Other Ambulatory Visit (INDEPENDENT_AMBULATORY_CARE_PROVIDER_SITE_OTHER): Admitting: Pharmacist

## 2023-11-10 DIAGNOSIS — E1169 Type 2 diabetes mellitus with other specified complication: Secondary | ICD-10-CM

## 2023-11-10 DIAGNOSIS — Z794 Long term (current) use of insulin: Secondary | ICD-10-CM

## 2023-11-10 NOTE — Progress Notes (Signed)
 11/10/2023 Name: [name] MRN: [id number] DOB: 04-22-1950  Chief Complaint  Patient presents with   Medication Management   Medication Assistance    [name] is a 73 y.o. year old male who presented for a telephone visit.   They were referred to the pharmacist by their PCP for assistance in managing diabetes, hypertension, hyperlipidemia, and medication access.      Subjective:   Care Team: Primary Care Provider: Edman Marsa PARAS, DO ; Next Scheduled Visit: 03/19/2024 Cardiologist: Darliss Rogue, MD Nephrologist: Dennise Capri, MD  ; Next Scheduled Visit: 04/09/2024 Urology: [facility]  Medication Access/Adherence  Current Pharmacy:  [facility] [address] [place] - [address] [place] [address] Phone: [phone] Fax: [phone]  [facility] - [place], [place] - [address] [place] [address] Phone: [phone] Fax: [phone]  [facility] [place], [place] - [address] [place] [address] Phone: [phone] Fax: [phone]   Patient reports affordability concerns with their medications: No  Patient reports access/transportation concerns to their pharmacy: No  Patient reports adherence concerns with their medications:  No     Patient using weekly pillbox  Today patient shares that he had his prostate biopsy completed as planned and was advised that he has prostate cancer. Scheduled for follow up with [facility] on 8/20   Diabetes:   Current medications:  Insulin glargine ([name]/Lantus) 37 units at bedtime Ozempic 0.5 mg weekly on Tuesday (switched from Rybelsus to Ozempic on 8/5)   Medications tried in the past:  Trulicity (GI intolerance) Metformin (unable to tolerate - GI side effects) Bydureon (difficulty with device/administration) Invokana (felt  dehydrated) Rybelsus (stopped with start of Ozempic)   Current glucose readings: morning fasting ranging 82-118; today 116    Patient denies hypoglycemic s/sx including dizziness, shakiness, sweating - Carries glucose tablets to use if needed for low blood sugar  Current physical activity: reports typically stays active throughout the day working and walking some at home     Current medication access support:  - Enrolled in patient assistance for for [employer] from [employer] and Rybelsus from [employer] patient assistance programs through 03/27/2024     Hypertension:   Current medications:  Carvedilol 12.5 mg twice daily Lisinopril 40 mg daily  Imdur 30 mg -1/2 tablet (15 mg) daily   Patient has an automated, upper arm home BP cuff Reports blood pressure this morning 146/79, HR 66  *Note checked before blood pressure medications and while chewing tobacco   Denies symptoms of hypotension   Current physical activity: reports typically stays active throughout the day working and walking some at home     Hyperlipidemia/hx of CAD/NSTEMI:   Current lipid lowering medications: Repatha 140 mg every 14 days             Reports tolerating well   Previous therapies tried: atorvastatin 40 mg daily & 10 mg daily (unable to tolerate due to myalgia), ezetimibe    Have discussed importance of limiting saturated fats and trans fats in diet   Antiplatelet regimen: Aspirin 81 mg daily Clopidogrel 75 mg daily   Current physical activity: reports typically stays active throughout the day working and walking some at home   Current medication access support: Enrolled in [employer] Hypercholesterolemia - Medicare Access grant through 03/07/2024   Objective:  Lab Results  Component Value Date   HGBA1C  8.3 (H) 09/08/2023    Lab Results  Component Value Date   CREATININE 1.49 (H) 03/02/2023   BUN 19 03/02/2023   NA 138 03/02/2023   K 4.6 03/02/2023   CL 102 03/02/2023   CO2 29  03/02/2023    Lab Results  Component Value Date   CHOL 116 03/02/2023   HDL 32 (L) 03/02/2023   LDLCALC 57 03/02/2023   TRIG 196 (H) 03/02/2023   CHOLHDL 3.6 03/02/2023   BP Readings from Last 3 Encounters:  09/14/23 (!) 120/58  03/28/23 116/64  03/10/23 122/74   Pulse Readings from Last 3 Encounters:  09/14/23 80  03/28/23 79  03/10/23 82     Medications Reviewed Today     Reviewed by Alana Sharyle LABOR, RPH-CPP (Pharmacist) on 11/10/23 at 0859  Med List Status: <None>   Medication Order Taking? Sig Documenting Provider Last Dose Status Informant  ACCU-CHEK SMARTVIEW test strip [id number]  TEST BLOOD SUGAR TWO TIMES DAILY Edman Marsa PARAS, DO  Active   acetaminophen (TYLENOL) 500 MG tablet [id number]  Take 1,000 mg by mouth every 8 (eight) hours as needed.  [provider]  Active Spouse/Significant Other, Pharmacy Records  allopurinol (ZYLOPRIM) 100 MG tablet [id number]  TAKE 1 TABLET AT BEDTIME Edman Marsa PARAS, DO  Active   aspirin EC 81 MG EC tablet [id number]  Take 1 tablet (81 mg total) by mouth daily. Swallow whole. Josette Ade, MD  Active Spouse/Significant Other, Pharmacy Records  carvedilol (COREG) 12.5 MG tablet [id number]  Take 1 tablet (12.5 mg total) by mouth 2 (two) times daily. Edman Marsa PARAS, DO  Active   clopidogrel (PLAVIX) 75 MG tablet [id number]  Take 1 tablet (75 mg total) by mouth daily. Edman Marsa PARAS, DO  Active   colchicine 0.6 MG tablet [id number]  Take for gout flare start Day 1 with 2 tablets then may take 1 extra tablet in 1 hour. Starting Day 2 take 1 tablet daily for up to 7-10 days until resolve Karamalegos, Alexander J, DO  Active   fluticasone (FLONASE) 50 MCG/ACT nasal spray [id number]  USE 2 SPRAYS IN EACH NOSTRIL ONCE DAILY - USE FOR 4 TO 6 WEEKS, THEN STOP AND USE SEASONALLY OR AS NEEDED. Karamalegos, Marsa PARAS, DO  Active   gabapentin (NEURONTIN) 300 MG capsule [id number]  Take 1 capsule (300 mg  total) by mouth at bedtime. Edman Marsa PARAS, DO  Active   Insulin Glargine ([name] KWIKPEN) 100 UNIT/ML [id number]  Inject 37 Units into the skin at bedtime. Max daily dose 37 units Karamalegos, Marsa PARAS, DO  Active   Insulin Pen Needle (DROPLET PEN NEEDLES) 32G X 4 MM MISC [id number]  USE WITH [name] INSULIN INJECTION DAILY AS INSTRUCTED Edman, Marsa PARAS, DO  Active   isosorbide mononitrate (IMDUR) 30 MG 24 hr tablet [id number]  Take 0.5 tablets (15 mg total) by mouth daily. Darliss Rogue, MD  Active   lisinopril (ZESTRIL) 40 MG tablet [id number]  Take 1 tablet (40 mg total) by mouth daily. Karamalegos, Marsa PARAS, DO  Active   nitroGLYCERIN (NITROSTAT) 0.4 MG SL tablet [id number]  Place 1 tablet (0.4 mg total) under the tongue every 5 (five) minutes as needed for chest pain. Karamalegos, Marsa PARAS, DO  Active   ondansetron (ZOFRAN-ODT) 4 MG disintegrating tablet [id number]  Take 1 tablet (4 mg total) by mouth every 8 (eight) hours as needed for nausea or vomiting. Edman Marsa PARAS, DO  Active   pantoprazole (PROTONIX) 20 MG tablet [id number]  TAKE  1 TABLET TWICE DAILY BEFORE MEALS Edman Marsa PARAS, DO  Active   REPATHA  SURECLICK 140 MG/ML SOAJ [id number]  INJECT 140 MG INTO THE SKIN EVERY 14 DAYS Edman Marsa PARAS, DO  Active     Discontinued 11/10/23 0859 (Change in therapy)          Med Note [place], Brenlynn Fake A   Wed Oct 13, 2021  8:56 AM)    Semaglutide ,0.25 or 0.5MG /DOS, (OZEMPIC , 0.25 OR 0.5 MG/DOSE,) 2 MG/3ML SOPN [id number] Yes Inject 0.5 mg into the skin once a week. [provider]  Active               Assessment/Plan:   Diabetes: - Reviewed goal A1c, goal fasting, and goal 2 hour post prandial glucose - Reviewed dietary modifications including having well-balanced meals throughout the day while controlling carbohydrate portion sizes and limiting nighttime snacking - Recommend to monitor home blood sugar, keep log of  results and have this record to review during appointments, but to contact office or CM Pharmacist sooner for readings outside of established parameters or for symptoms - Discuss benefits of continuous glucose monitoring to aid with blood sugar control, but patient declines CGM at this time - Will collaborate with PCP to request provider send order for Ozempic  0.5 mg weekly to [employer] patient assistance program for patient - Patient to contact [name] patient assistance as needed for refills of [name]    Hypertension: - Reviewed long term cardiovascular and renal outcomes of uncontrolled blood pressure - Reviewed appropriate blood pressure monitoring technique and reviewed goal blood pressure.  - Encourage dietary changes, including limiting salt/sodium intake and increasing intake of fruit and vegetables - Recommended to check home blood pressure and heart rate and keep a log of the results and to contact office for readings outside of established parameters   Hyperlipidemia: - Encourage patient to continue taking Repatha  as directed     Follow Up Plan: Clinical Pharmacist will follow up with patient by telephone on 12/08/2023 at 8:30 AM    Sharyle Sia, PharmD, JAQUELINE, CPP Clinical Pharmacist [facility] [phone]

## 2023-11-10 NOTE — Patient Instructions (Signed)
 Goals Addressed             This Visit's Progress    Pharmacy - Patient Goals       Our goal A1c is less than 7%. This corresponds with fasting sugars less than 130 and 2 hour after meal sugars less than 180. Please check your fasting blood sugar each morning and keep a record of the results  Check your blood pressure once daily, and any time you have concerning symptoms like headache, chest pain, dizziness, shortness of breath, or vision changes.   Our goal is less than 130/80.  To appropriately check your blood pressure, make sure you do the following:  1) Avoid caffeine, exercise, or tobacco products for 30 minutes before checking. Empty your bladder. 2) Sit with your back supported in a flat-backed chair. Rest your arm on something flat (arm of the chair, table, etc). 3) Sit still with your feet flat on the floor, resting, for at least 5 minutes.  4) Check your blood pressure. Take 1-2 readings.  5) Write down these readings and bring with you to any provider appointments.  Bring your home blood pressure machine with you to a provider's office for accuracy comparison at least once a year.   Make sure you take your blood pressure medications before you come to any office visit, even if you were asked to fast for labs.   Feel free to call me with any questions or concerns. I look forward to our next call  Sharyle Sia, PharmD, [facility] Clinical Pharmacist [facility] [phone]

## 2023-11-15 ENCOUNTER — Other Ambulatory Visit: Payer: Self-pay | Admitting: Family Medicine

## 2023-11-15 DIAGNOSIS — C61 Malignant neoplasm of prostate: Secondary | ICD-10-CM | POA: Diagnosis not present

## 2023-11-15 DIAGNOSIS — Z794 Long term (current) use of insulin: Secondary | ICD-10-CM

## 2023-11-16 ENCOUNTER — Other Ambulatory Visit: Payer: Self-pay | Admitting: Family Medicine

## 2023-11-16 DIAGNOSIS — E1169 Type 2 diabetes mellitus with other specified complication: Secondary | ICD-10-CM

## 2023-11-16 NOTE — Telephone Encounter (Signed)
 Requested by interface surescripts.  Requested Prescriptions  Pending Prescriptions Disp Refills   DROPLET PEN NEEDLES 32G X 4 MM MISC [Pharmacy Med Name: DROPLET PEN NEEDLES 32GX4MM 32G X 4 MM] 100 each 3    Sig: USE WITH [name]  INSULIN  INJECTION DAILY AS INSTRUCTED     Endocrinology: Diabetes - Testing Supplies Passed - 11/16/2023  2:56 PM      Passed - Valid encounter within last 12 months    Recent Outpatient Visits           2 months ago Type 2 diabetes mellitus with other specified complication, with long-term current use of insulin  [facility])   [place] [facility] [place], Marsa PARAS, [place]

## 2023-11-17 ENCOUNTER — Other Ambulatory Visit

## 2023-11-17 NOTE — Telephone Encounter (Signed)
 Requested Prescriptions  Pending Prescriptions Disp Refills   Insulin  Glargine ([name]  KWIKPEN) 100 UNIT/ML [Pharmacy Med Name: [name]  KwikPen Subcutaneous Solution Pen-injector 100 UNIT/ML] 45 mL 1    Sig: DIAL AND INJECT 37 UNITS UNDER THE SKIN DAILY AT BEDTIME. MAX DAILY DOSE IS 37 UNITS.     Endocrinology:  Diabetes - Insulins Failed - 11/17/2023  8:22 AM      Failed - HBA1C is between 0 and 7.9 and within 180 days    Hemoglobin A1C  Date Value Ref Range Status  01/11/2019 7.8  Final   Hgb A1c MFr Bld  Date Value Ref Range Status  09/08/2023 8.3 (H) <5.7 % Final    Comment:    For someone without known diabetes, a hemoglobin A1c value of 6.5% or greater indicates that they may have  diabetes and this should be confirmed with a follow-up  test. . For someone with known diabetes, a value <7% indicates  that their diabetes is well controlled and a value  greater than or equal to 7% indicates suboptimal  control. A1c targets should be individualized based on  duration of diabetes, age, comorbid conditions, and  other considerations. . Currently, no consensus exists regarding use of hemoglobin A1c for diagnosis of diabetes for children. SABRA Amy - Valid encounter within last 6 months    Recent Outpatient Visits           2 months ago Type 2 diabetes mellitus with other specified complication, with long-term current use of insulin  [facility])   [place] [facility] [place], Marsa PARAS, [place]

## 2023-11-21 ENCOUNTER — Other Ambulatory Visit (HOSPITAL_COMMUNITY): Payer: Self-pay | Admitting: Urology

## 2023-11-21 DIAGNOSIS — C61 Malignant neoplasm of prostate: Secondary | ICD-10-CM

## 2023-11-28 ENCOUNTER — Encounter (HOSPITAL_COMMUNITY)
Admission: RE | Admit: 2023-11-28 | Discharge: 2023-11-28 | Disposition: A | Discharge status: Home / Self Care | Source: Ambulatory Visit | Attending: Urology | Admitting: Urology

## 2023-11-28 DIAGNOSIS — C61 Malignant neoplasm of prostate: Secondary | ICD-10-CM | POA: Insufficient documentation

## 2023-11-28 MED ORDER — FLOTUFOLASTAT F 18 GALLIUM 296-5846 MBQ/ML IV SOLN
8.7800 | Freq: Once | INTRAVENOUS | Status: AC
  Administered 2023-11-28: 8.78 via INTRAVENOUS

## 2023-12-08 ENCOUNTER — Other Ambulatory Visit (INDEPENDENT_AMBULATORY_CARE_PROVIDER_SITE_OTHER): Admitting: Pharmacist

## 2023-12-08 DIAGNOSIS — I2510 Atherosclerotic heart disease of native coronary artery without angina pectoris: Secondary | ICD-10-CM

## 2023-12-08 DIAGNOSIS — E1169 Type 2 diabetes mellitus with other specified complication: Secondary | ICD-10-CM

## 2023-12-08 DIAGNOSIS — I129 Hypertensive chronic kidney disease with stage 1 through stage 4 chronic kidney disease, or unspecified chronic kidney disease: Secondary | ICD-10-CM

## 2023-12-08 DIAGNOSIS — Z794 Long term (current) use of insulin: Secondary | ICD-10-CM

## 2023-12-08 NOTE — Patient Instructions (Signed)
 Goals Addressed             This Visit's Progress    Pharmacy - Patient Goals       Our goal A1c is less than 7%. This corresponds with fasting sugars less than 130 and 2 hour after meal sugars less than 180. Please check your fasting blood sugar each morning and keep a record of the results  Check your blood pressure once daily, and any time you have concerning symptoms like headache, chest pain, dizziness, shortness of breath, or vision changes.   Our goal is less than 130/80.  To appropriately check your blood pressure, make sure you do the following:  1) Avoid caffeine, exercise, or tobacco products for 30 minutes before checking. Empty your bladder. 2) Sit with your back supported in a flat-backed chair. Rest your arm on something flat (arm of the chair, table, etc). 3) Sit still with your feet flat on the floor, resting, for at least 5 minutes.  4) Check your blood pressure. Take 1-2 readings.  5) Write down these readings and bring with you to any provider appointments.  Bring your home blood pressure machine with you to a provider's office for accuracy comparison at least once a year.   Make sure you take your blood pressure medications before you come to any office visit, even if you were asked to fast for labs.   Feel free to call me with any questions or concerns. I look forward to our next call  Sharyle Sia, PharmD, [facility] Clinical Pharmacist [facility] [phone]

## 2023-12-08 NOTE — Progress Notes (Signed)
 12/08/2023 Name: [name] MRN: [id number] DOB: 10-11-1950  Chief Complaint  Patient presents with   Medication Management   Medication Assistance    [name] is a 73 y.o. year old male who presented for a telephone visit.   They were referred to the pharmacist by their PCP for assistance in managing diabetes, hypertension, hyperlipidemia, and medication access.      Subjective:   Care Team: Primary Care Provider: Edman Marsa PARAS, DO ; Next Scheduled Visit: 03/19/2024 Cardiologist: Darliss Rogue, MD Nephrologist: Dennise Capri, MD  ; Next Scheduled Visit: 04/09/2024 Urology: [facility]; Next Scheduled Visit: 12/20/2023  Medication Access/Adherence  Current Pharmacy:  [facility] [address], [place] - [address] [place] [address] Phone: [phone] Fax: [phone]  [facility] - [place], [place] - [address] [place] [address] Phone: [phone] Fax: [phone]  [facility] [place], [place] - [address] [place] [address] Phone: [phone] Fax: [phone]   Patient reports affordability concerns with their medications: No  Patient reports access/transportation concerns to their pharmacy: No  Patient reports adherence concerns with their medications:  No     Patient using weekly pillbox  Reports has next follow up with Urologist on 9/24 to discuss plan for his prostate cancer   Diabetes:   Current medications:  Insulin  glargine ([name] /Lantus ) 37 units at bedtime Ozempic  0.5 mg weekly on Tuesday Reports tolerating well   Medications tried in the past:  Trulicity  (GI intolerance) Metformin  (unable to tolerate - GI side effects) Bydureon  (difficulty with device/administration) Invokana  (felt dehydrated) Rybelsus  (stopped with start of Ozempic )   Current  glucose readings: morning fasting ranging 97-142; today 123   *Attributes higher readings to eating out more for supper  Patient denies hypoglycemic s/sx including dizziness, shakiness, sweating - Carries glucose tablets to use if needed for low blood sugar   Current physical activity: reports typically stays active throughout the day working and walking some at home     Current medication access support:  - Enrolled in patient assistance for for [name]  from [name] and Ozempic  from [employer] patient assistance programs through 03/27/2024  Picked up 4 month supply of Ozempic  from office last week     Hypertension:   Current medications:  Carvedilol  12.5 mg twice daily Lisinopril  40 mg daily  Imdur  30 mg -1/2 tablet (15 mg) daily   Patient has an automated, upper arm home BP cuff Denies checking home BP recently   Denies symptoms of hypotension   Current physical activity: reports typically stays active throughout the day working and walking some at home     Hyperlipidemia/hx of CAD/NSTEMI:   Current lipid lowering medications: Repatha  140 mg every 14 days             Reports tolerating well   Previous therapies tried: atorvastatin  40 mg daily & 10 mg daily (unable to tolerate due to myalgia), ezetimibe     Have discussed importance of limiting saturated fats and trans fats in diet   Antiplatelet regimen: Aspirin  81 mg daily Clopidogrel  75 mg daily   Current physical activity: reports typically stays active throughout the day working and walking some at home   Current medication access support: Enrolled in [employer] Hypercholesterolemia - Medicare Access grant through 03/07/2024   Objective:  Lab Results  Component Value Date   HGBA1C 8.3 (H) 09/08/2023  Lab Results  Component Value Date   CREATININE 1.49 (H) 03/02/2023   BUN 19 03/02/2023   NA 138 03/02/2023   K 4.6 03/02/2023   CL 102 03/02/2023   CO2 29 03/02/2023    Lab Results  Component Value  Date   CHOL 116 03/02/2023   HDL 32 (L) 03/02/2023   LDLCALC 57 03/02/2023   TRIG 196 (H) 03/02/2023   CHOLHDL 3.6 03/02/2023   BP Readings from Last 3 Encounters:  09/14/23 (!) 120/58  03/28/23 116/64  03/10/23 122/74   Pulse Readings from Last 3 Encounters:  09/14/23 80  03/28/23 79  03/10/23 82    Medications Reviewed Today     Reviewed by Alana Sharyle LABOR, RPH-CPP (Pharmacist) on 12/08/23 at [phone]  Med List Status: <None>   Medication Order Taking? Sig Documenting Provider Last Dose Status Informant  ACCU-CHEK SMARTVIEW test strip [id number]  TEST BLOOD SUGAR TWO TIMES DAILY Edman Marsa PARAS, DO  Active   acetaminophen  (TYLENOL ) 500 MG tablet [id number]  Take 1,000 mg by mouth every 8 (eight) hours as needed.  [provider]  Active Spouse/Significant Other, Pharmacy Records  allopurinol  (ZYLOPRIM ) 100 MG tablet [id number]  TAKE 1 TABLET AT BEDTIME Edman Marsa PARAS, DO  Active   aspirin  EC 81 MG EC tablet [id number]  Take 1 tablet (81 mg total) by mouth daily. Swallow whole. Josette Ade, MD  Active Spouse/Significant Other, Pharmacy Records  carvedilol  (COREG ) 12.5 MG tablet [id number]  Take 1 tablet (12.5 mg total) by mouth 2 (two) times daily. Karamalegos, Marsa PARAS, DO  Active   clopidogrel  (PLAVIX ) 75 MG tablet [id number]  Take 1 tablet (75 mg total) by mouth daily. Edman Marsa PARAS, DO  Active   colchicine  0.6 MG tablet [id number]  Take for gout flare start Day 1 with 2 tablets then may take 1 extra tablet in 1 hour. Starting Day 2 take 1 tablet daily for up to 7-10 days until resolve Edman Marsa PARAS, DO  Active   DROPLET PEN NEEDLES 32G X 4 MM MISC [id number]  USE WITH [name]  INSULIN  INJECTION DAILY AS INSTRUCTED Edman Marsa PARAS, DO  Active   fluticasone  (FLONASE ) 50 MCG/ACT nasal spray [id number]  USE 2 SPRAYS IN EACH NOSTRIL ONCE DAILY - USE FOR 4 TO 6 WEEKS, THEN STOP AND USE SEASONALLY OR AS NEEDED. Edman Marsa PARAS, DO  Active   gabapentin  (NEURONTIN ) 300 MG capsule [id number]  Take 1 capsule (300 mg total) by mouth at bedtime. Edman Marsa PARAS, DO  Active   Insulin  Glargine ([name]  KWIKPEN) 100 UNIT/ML [id number] Yes DIAL AND INJECT 37 UNITS UNDER THE SKIN DAILY AT BEDTIME. MAX DAILY DOSE IS 37 UNITS. Edman Marsa PARAS, DO  Active   isosorbide  mononitrate (IMDUR ) 30 MG 24 hr tablet [id number]  Take 0.5 tablets (15 mg total) by mouth daily. Darliss Rogue, MD  Active   lisinopril  (ZESTRIL ) 40 MG tablet [id number]  Take 1 tablet (40 mg total) by mouth daily. Karamalegos, Marsa PARAS, DO  Active   nitroGLYCERIN  (NITROSTAT ) 0.4 MG SL tablet [id number]  Place 1 tablet (0.4 mg total) under the tongue every 5 (five) minutes as needed for chest pain. Edman Marsa PARAS, DO  Active     Discontinued 12/08/23 [id number] (Patient Preference)   pantoprazole  (PROTONIX ) 20 MG tablet [id number]  TAKE 1 TABLET TWICE DAILY BEFORE MEALS Edman Marsa PARAS, DO  Active   REPATHA  SURECLICK 140 MG/ML SOAJ [id number] Yes INJECT 140 MG INTO THE SKIN EVERY 14 DAYS Karamalegos,  Marsa PARAS, DO  Active   Semaglutide ,0.25 or 0.5MG /DOS, (OZEMPIC , 0.25 OR 0.5 MG/DOSE,) 2 MG/3ML SOPN [id number] Yes Inject 0.5 mg into the skin once a week. [provider]  Active               Assessment/Plan:   Diabetes: - Reviewed goal A1c, goal fasting, and goal 2 hour post prandial glucose - Reviewed dietary modifications including having well-balanced meals throughout the day while controlling carbohydrate portion sizes and limiting nighttime snacking - Recommend to monitor home blood sugar, keep log of results and have this record to review during appointments, but to contact office or CM Pharmacist sooner for readings outside of established parameters or for symptoms - Patient to contact [name] patient assistance as needed for refills of [name]  - Patient to monitor supply of Ozempic  at home and follow  up with CPhT as needed for refill when has 1 month supply remaining   Hypertension: - Reviewed long term cardiovascular and renal outcomes of uncontrolled blood pressure - Reviewed appropriate blood pressure monitoring technique and reviewed goal blood pressure.  - Encourage dietary changes, including limiting salt/sodium intake and increasing intake of fruit and vegetables - Recommended to check home blood pressure and heart rate and keep a log of the results and to contact office for readings outside of established parameters   Hyperlipidemia: - Encourage patient to continue taking Repatha  as directed     Follow Up Plan: Clinical Pharmacist will follow up with patient by telephone on 01/22/2024 at 8:30 AM    Sharyle Sia, PharmD, JAQUELINE, CPP Clinical Pharmacist [facility] [phone]

## 2023-12-20 DIAGNOSIS — N186 End stage renal disease: Secondary | ICD-10-CM | POA: Diagnosis not present

## 2023-12-20 DIAGNOSIS — C61 Malignant neoplasm of prostate: Secondary | ICD-10-CM | POA: Diagnosis not present

## 2023-12-26 ENCOUNTER — Encounter: Payer: Self-pay | Admitting: Pharmacist

## 2023-12-26 ENCOUNTER — Other Ambulatory Visit: Payer: Self-pay | Admitting: Pharmacist

## 2023-12-26 DIAGNOSIS — C61 Malignant neoplasm of prostate: Secondary | ICD-10-CM | POA: Diagnosis not present

## 2023-12-26 DIAGNOSIS — Z5111 Encounter for antineoplastic chemotherapy: Secondary | ICD-10-CM | POA: Diagnosis not present

## 2023-12-26 DIAGNOSIS — E1169 Type 2 diabetes mellitus with other specified complication: Secondary | ICD-10-CM

## 2023-12-26 DIAGNOSIS — Z794 Long term (current) use of insulin: Secondary | ICD-10-CM

## 2023-12-26 NOTE — Progress Notes (Signed)
   12/26/2023  Patient ID: [name], male   DOB: [birth year], 73 y.o.   MRN: [id number]  Patient enrolled in Ozempic  patient assistance program from [employer] through 03/27/2024  [employer] has announced that they will no longer offer Ozempic  to [employer] beneficiaries through their Patient Assistance Program in 2026.   Outreach to patient today and provide this update.   Based on reported income, patient does not meet criteria for Extra Help subsidy  Counsel patient that Medicare's Annual Enrollment Period for 2026 starts 01/10/2024. Encourage patient to review deductibles formulary coverage and copay amounts (including for Ozempic ) between plans. Also share with patient the contact information for the [facility] Information Program [facility]) that can help with reviewing these Medicare plans As requested will share this information with patient via MyChart message  Also patient to monitor his supply of Ozempic  at home for remainder of calendar year and when has only a 1 month supply remaining, to contact [name]: Phone: [phone] to request CPhT start refill form with PCP to be sent to program.  [name], PharmD, [name], [name] Clinical Pharmacist [facility] [phone]

## 2023-12-26 NOTE — Patient Instructions (Signed)
 Novo [employer], the maker of Ozempic  and Rybelsus , has announced that they will no longer offer these medications to Medicare beneficiaries through their Patient Assistance Program in 2026.    This means that if you currently receive Ozempic  or Rybelsus  at no cost through the program, starting in January 2026 you'll need to use your insurance to continue on these medicines.      Choosing a Medicare Plan   With Medicare's Annual Enrollment Period starting on October 15th, we encourage you to review formulary coverage and copay amounts for Ozempic , as costs can vary widely between plans. Starting on Wednesday, October 1st you can compare your Medicare plan options by going to the Plan Finder tool at [employer].gov (TeleconferenceOnDemand.fr ).    There are Medicare Specialists through the [facility] Insurance Information Program ([place]) that can help you shop for Medicare plans.    [place] [facility] toll free number: [phone] (Monday - Friday 8 am - 5 pm)   [facility] [address]     [place]  [phone] Call for an appointment with [facility]     Maximum Out-of-Pocket and Prescription Payment Plan   In 2026, the maximum yearly out-of-pocket cost for medications is $2,100 for all Medicare beneficiaries. This means you will not have to pay more than $2,100 in total for all prescription medications filled on your Medicare plan in 2026. You may also choose to enroll in a Medicare Prescription Payment Plan through your chosen 2026 Medicare plan. This lets you spread out your prescription drug costs over the year instead of laying a large amount all at once at the pharmacy.    Sharyle Sia, PharmD, JAQUELINE, CPP Clinical Pharmacist [facility] [phone]

## 2023-12-28 ENCOUNTER — Ambulatory Visit
Admission: RE | Admit: 2023-12-28 | Discharge: 2023-12-28 | Disposition: A | Discharge status: Home / Self Care | Source: Ambulatory Visit | Attending: Radiation Oncology | Admitting: Radiation Oncology

## 2023-12-28 ENCOUNTER — Encounter: Payer: Self-pay | Admitting: Radiation Oncology

## 2023-12-28 VITALS — BP 146/76 | HR 67 | Temp 98.9°F | Resp 16 | Wt 226.0 lb

## 2023-12-28 DIAGNOSIS — Z87891 Personal history of nicotine dependence: Secondary | ICD-10-CM | POA: Insufficient documentation

## 2023-12-28 DIAGNOSIS — C61 Malignant neoplasm of prostate: Secondary | ICD-10-CM | POA: Insufficient documentation

## 2023-12-28 DIAGNOSIS — Z510 Encounter for antineoplastic radiation therapy: Secondary | ICD-10-CM | POA: Insufficient documentation

## 2023-12-28 DIAGNOSIS — Z191 Hormone sensitive malignancy status: Secondary | ICD-10-CM | POA: Diagnosis not present

## 2023-12-28 NOTE — Consult Note (Signed)
 NEW PATIENT EVALUATION  Name: [name]  MRN: [id number]  Date:   12/28/2023     DOB: July 18, 1950   This 73 y.o. male patient presents to the clinic for initial evaluation of Gleason 4 adenocarcinoma the prostate presenting with a PSA of 10.4.  REFERRING PHYSICIAN: Edman Blunt *  CHIEF COMPLAINT:  Chief Complaint  Patient presents with   Prostate Cancer    DIAGNOSIS: The encounter diagnosis was Malignant neoplasm of prostate (HCC).   PREVIOUS INVESTIGATIONS:  MRI and PSMA PET scan reviewed Pathology reports requested Clinical notes reviewed  HPI: Patient is a 73 year old male originally presented back in 18 with elevated PSA had a biopsy which was negative.  He has been followed by urology for multiple urinary tract infections.  He recently was noted to have a PSA of 10.4.  He underwent biopsy although only by report was noted to be Gleason 4 lesion.  He had an MRI of his prostate showing a 2.8 cm transitional zone lesion throughout the right base mid gland and apex highly suspicious for high-grade carcinoma.  He also an 11 mm peripheral zone lesion in the right posterior medial mid gland again suspicious for high-grade cancer.  No evidence of extracapsular extension or pelvic metastatic disease noted.  He underwent a PSMA PET scan only showing radiotracer accumulation in the right central prostate gland consistent with primary prostate cancer no evidence of metastatic disease or pelvic adenopathy involvement.  Patient has had a history of sepsis secondary prostate biopsy.  Patient is also had cardiac stent placement, type 2 diabetes, history of myocardial infarction and PE.  He is seen today for radiation oncology opinion.  He has very little symptoms has nocturia x 2 no GI problems no bone pain.  PLANNED TREATMENT REGIMEN: Image guided IMRT radiation therapy.  PAST MEDICAL HISTORY:  has a past medical history of Allergy, CAD (coronary artery disease), Cataract (2023),  Chronic headaches, CKD (chronic kidney disease), stage III (HCC), Edentulous, GERD (gastroesophageal reflux disease), Gout, Heart burn, History of echocardiogram, Hyperlipidemia, Hypertension, Motion sickness, NSTEMI (non-ST elevated myocardial infarction) (HCC) (12/27/2019), Pulmonary embolism (HCC) (05/2017), and Type 2 diabetes mellitus (HCC).    PAST SURGICAL HISTORY:  Past Surgical History:  Procedure Laterality Date   BACK SURGERY  02/26/2019   CATARACT EXTRACTION W/PHACO Left 04/14/2021   Procedure: CATARACT EXTRACTION PHACO AND INTRAOCULAR LENS PLACEMENT (IOC) LEFT DIABETIC maylugin;  Surgeon: Mittie Gaskin, MD;  Location: [facility];  Service: Ophthalmology;  Laterality: Left;  11.22 1:25.1   CATARACT EXTRACTION W/PHACO Right 06/09/2021   Procedure: CATARACT EXTRACTION PHACO AND INTRAOCULAR LENS PLACEMENT (IOC) RIGHT DIABETIC MALYUGIN;  Surgeon: Mittie Gaskin, MD;  Location: [facility];  Service: Ophthalmology;  Laterality: Right;  Diabetic 4.75 01:05.7   CORONARY STENT INTERVENTION N/A 10/26/2021   Procedure: CORONARY STENT INTERVENTION;  Surgeon: Mady Bruckner, MD;  Location: [facility];  Service: Cardiovascular;  Laterality: N/A;   CORONARY ULTRASOUND/IVUS N/A 10/26/2021   Procedure: Intravascular Ultrasound/IVUS;  Surgeon: Mady Bruckner, MD;  Location: [facility];  Service: Cardiovascular;  Laterality: N/A;   EYE SURGERY  see above comment   KNEE SURGERY     LEFT HEART CATH AND CORONARY ANGIOGRAPHY N/A 12/30/2019   Procedure: LEFT HEART CATH AND CORONARY ANGIOGRAPHY;  Surgeon: Darron Deatrice LABOR, MD;  Location: [facility];  Service: Cardiovascular;  Laterality: N/A;   LEFT HEART CATH AND CORONARY ANGIOGRAPHY N/A 10/26/2021   Procedure: LEFT HEART CATH AND CORONARY ANGIOGRAPHY;  Surgeon: Mady Bruckner, MD;  Location: [facility];  Service: Cardiovascular;  Laterality: N/A;   SHOULDER SURGERY     SPINE  SURGERY  02/26/2019   TONSILLECTOMY      FAMILY HISTORY: family history includes Cancer in his brother and father; Heart disease in his brother, brother, and sister.  SOCIAL HISTORY:  reports that he quit smoking about 25 years ago. His smoking use included cigars and cigarettes. His smokeless tobacco use includes chew. He reports that he does not currently use alcohol. He reports that he does not use drugs.  ALLERGIES: Crestor  [rosuvastatin  calcium ], Ciprofloxacin , Invokana  [canagliflozin ], Prednisone, and Sulfamethizole  MEDICATIONS:  Current Outpatient Medications  Medication Sig Dispense Refill   ACCU-CHEK SMARTVIEW test strip TEST BLOOD SUGAR TWO TIMES DAILY 200 strip 0   acetaminophen  (TYLENOL ) 500 MG tablet Take 1,000 mg by mouth every 8 (eight) hours as needed.      allopurinol  (ZYLOPRIM ) 100 MG tablet TAKE 1 TABLET AT BEDTIME 90 tablet 1   aspirin  EC 81 MG EC tablet Take 1 tablet (81 mg total) by mouth daily. Swallow whole. 30 tablet 0   carvedilol  (COREG ) 12.5 MG tablet Take 1 tablet (12.5 mg total) by mouth 2 (two) times daily. 180 tablet 3   clopidogrel  (PLAVIX ) 75 MG tablet Take 1 tablet (75 mg total) by mouth daily. 90 tablet 3   colchicine  0.6 MG tablet Take for gout flare start Day 1 with 2 tablets then may take 1 extra tablet in 1 hour. Starting Day 2 take 1 tablet daily for up to 7-10 days until resolve 30 tablet 2   DROPLET PEN NEEDLES 32G X 4 MM MISC USE WITH [name]  INSULIN  INJECTION DAILY AS INSTRUCTED 100 each 3   fluticasone  (FLONASE ) 50 MCG/ACT nasal spray USE 2 SPRAYS IN EACH NOSTRIL ONCE DAILY - USE FOR 4 TO 6 WEEKS, THEN STOP AND USE SEASONALLY OR AS NEEDED. 48 g 0   gabapentin  (NEURONTIN ) 300 MG capsule Take 1 capsule (300 mg total) by mouth at bedtime. 90 capsule 1   Insulin  Glargine ([name]  KWIKPEN) 100 UNIT/ML DIAL AND INJECT 37 UNITS UNDER THE SKIN DAILY AT BEDTIME. MAX DAILY DOSE IS 37 UNITS. 45 mL 1   isosorbide  mononitrate (IMDUR ) 30 MG 24 hr tablet Take  0.5 tablets (15 mg total) by mouth daily. 45 tablet 3   lisinopril  (ZESTRIL ) 40 MG tablet Take 1 tablet (40 mg total) by mouth daily. 90 tablet 3   nitroGLYCERIN  (NITROSTAT ) 0.4 MG SL tablet Place 1 tablet (0.4 mg total) under the tongue every 5 (five) minutes as needed for chest pain. 30 tablet 0   pantoprazole  (PROTONIX ) 20 MG tablet TAKE 1 TABLET TWICE DAILY BEFORE MEALS 180 tablet 1   REPATHA  SURECLICK 140 MG/ML SOAJ INJECT 140 MG INTO THE SKIN EVERY 14 DAYS 6 mL 5   Semaglutide ,0.25 or 0.5MG /DOS, (OZEMPIC , 0.25 OR 0.5 MG/DOSE,) 2 MG/3ML SOPN Inject 0.5 mg into the skin once a week.     No current facility-administered medications for this encounter.    ECOG PERFORMANCE STATUS:  0 - Asymptomatic  REVIEW OF SYSTEMS: Patient denies any weight loss, fatigue, weakness, fever, chills or night sweats. Patient denies any loss of vision, blurred vision. Patient denies any ringing  of the ears or hearing loss. No irregular heartbeat. Patient denies heart murmur or history of fainting. Patient denies any chest pain or pain radiating to her upper extremities. Patient denies any shortness of breath, difficulty breathing at night, cough or  hemoptysis. Patient denies any swelling in the lower legs. Patient denies any nausea vomiting, vomiting of blood, or coffee ground material in the vomitus. Patient denies any stomach pain. Patient states has had normal bowel movements no significant constipation or diarrhea. Patient denies any dysuria, hematuria or significant nocturia. Patient denies any problems walking, swelling in the joints or loss of balance. Patient denies any skin changes, loss of hair or loss of weight. Patient denies any excessive worrying or anxiety or significant depression. Patient denies any problems with insomnia. Patient denies excessive thirst, polyuria, polydipsia. Patient denies any swollen glands, patient denies easy bruising or easy bleeding. Patient denies any recent infections, allergies  or URI. Patient s visual fields have not changed significantly in recent time.   PHYSICAL EXAM: BP (!) 146/76   Pulse 67   Temp 98.9 F (37.2 C) (Tympanic)   Resp 16   Wt 226 lb (102.5 kg)   BMI 31.52 kg/m  Well-developed well-nourished patient in NAD. HEENT reveals PERLA, EOMI, discs not visualized.  Oral cavity is clear. No oral mucosal lesions are identified. Neck is clear without evidence of cervical or supraclavicular adenopathy. Lungs are clear to A&P. Cardiac examination is essentially unremarkable with regular rate and rhythm without murmur rub or thrill. Abdomen is benign with no organomegaly or masses noted. Motor sensory and DTR levels are equal and symmetric in the upper and lower extremities. Cranial nerves II through XII are grossly intact. Proprioception is intact. No peripheral adenopathy or edema is identified. No motor or sensory levels are noted. Crude visual fields are within normal range.  LABORATORY DATA: Pathology reports requested for my review    RADIOLOGY RESULTS: MRI and PSMA PET scan both reviewed compatible with above-stated findings   IMPRESSION: Gleason score of 4 adenocarcinoma the prostate in 73 year old male.  Awaiting for pathology report for complete staging  PLAN: At this time obtaining his pathology reports which is not included in her medical record.  Based on his problems with sepsis from biopsy as I am going to exclude markers for image guided radiation therapy.  Will do daily cone beam to assessment of his treatments.  I recommend an 49 Gray over 8 weeks using IMRT treatment planning and delivery.  Patient is already received Eligard.  Risks and benefits of treatment including increased lower urinary tract symptoms diarrhea fatigue alteration blood counts all were explained in detail to the patient.  I have personally set up and ordered CT simulation for next week.  Patient comprehends my recommendations well.  I would like to take this opportunity to  thank you for allowing me to participate in the care of your patient.SABRA Marcey Penton, MD

## 2024-01-02 DIAGNOSIS — H26491 Other secondary cataract, right eye: Secondary | ICD-10-CM | POA: Diagnosis not present

## 2024-01-02 DIAGNOSIS — E119 Type 2 diabetes mellitus without complications: Secondary | ICD-10-CM | POA: Diagnosis not present

## 2024-01-02 DIAGNOSIS — H353131 Nonexudative age-related macular degeneration, bilateral, early dry stage: Secondary | ICD-10-CM | POA: Diagnosis not present

## 2024-01-02 DIAGNOSIS — Z961 Presence of intraocular lens: Secondary | ICD-10-CM | POA: Diagnosis not present

## 2024-01-02 LAB — OPHTHALMOLOGY REPORT-SCANNED

## 2024-01-03 ENCOUNTER — Ambulatory Visit
Admission: RE | Admit: 2024-01-03 | Discharge: 2024-01-03 | Disposition: A | Source: Ambulatory Visit | Attending: Radiation Oncology | Admitting: Radiation Oncology

## 2024-01-03 DIAGNOSIS — Z510 Encounter for antineoplastic radiation therapy: Secondary | ICD-10-CM | POA: Diagnosis not present

## 2024-01-08 DIAGNOSIS — Z87891 Personal history of nicotine dependence: Secondary | ICD-10-CM | POA: Diagnosis not present

## 2024-01-08 DIAGNOSIS — C61 Malignant neoplasm of prostate: Secondary | ICD-10-CM | POA: Diagnosis not present

## 2024-01-08 DIAGNOSIS — Z510 Encounter for antineoplastic radiation therapy: Secondary | ICD-10-CM | POA: Diagnosis not present

## 2024-01-10 ENCOUNTER — Ambulatory Visit: Attending: Cardiology | Admitting: Cardiology

## 2024-01-10 ENCOUNTER — Encounter: Payer: Self-pay | Admitting: Cardiology

## 2024-01-10 VITALS — BP 146/72 | HR 67 | Ht 71.0 in | Wt 230.2 lb

## 2024-01-10 DIAGNOSIS — I2510 Atherosclerotic heart disease of native coronary artery without angina pectoris: Secondary | ICD-10-CM | POA: Diagnosis not present

## 2024-01-10 DIAGNOSIS — E785 Hyperlipidemia, unspecified: Secondary | ICD-10-CM

## 2024-01-10 DIAGNOSIS — I10 Essential (primary) hypertension: Secondary | ICD-10-CM

## 2024-01-10 NOTE — Patient Instructions (Signed)
 Medication Instructions:  STOP the Aspirin   *If you need a refill on your cardiac medications before your next appointment, please call your pharmacy*  Lab Work: None ordered If you have labs (blood work) drawn today and your tests are completely normal, you will receive your results only by: MyChart Message (if you have MyChart) OR A paper copy in the mail If you have any lab test that is abnormal or we need to change your treatment, we will call you to review the results.  Testing/Procedures: None ordered  Follow-Up: At [facility], you and your health needs are our priority.  As part of our continuing mission to provide you with exceptional heart care, our providers are all part of one team.  This team includes your primary Cardiologist (physician) and Advanced Practice Providers or APPs (Physician Assistants and Nurse Practitioners) who all work together to provide you with the care you need, when you need it.  Your next appointment:   12 month(s)  Provider:   You may see Redell Cave, MD or one of the following Advanced Practice Providers on your designated Care Team:   Lonni Meager, NP Lesley Maffucci, PA-C Bernardino Bring, PA-C Cadence Freeport, PA-C Tylene Lunch, NP Barnie Hila, NP    We recommend signing up for the patient portal called MyChart.  Sign up information is provided on this After Visit Summary.  MyChart is used to connect with patients for Virtual Visits (Telemedicine).  Patients are able to view lab/test results, encounter notes, upcoming appointments, etc.  Non-urgent messages can be sent to your provider as well.   To learn more about what you can do with MyChart, go to [email or web address].

## 2024-01-10 NOTE — Progress Notes (Signed)
 Cardiology Office Note:    Date:  01/10/2024   ID:  [name], DOB 1950/12/19, MRN [id number]  PCP:  Edman Marsa PARAS, DO  Cardiologist:  Redell Cave, MD  Electrophysiologist:  None   Referring MD: Edman Marsa *   Chief Complaint  Patient presents with   Follow-up    12 month follow up pt has been doing well with no complaints of chest pain, chest pressure or SOB, medciation reviewed verbally with patient    History of Present Illness:    [name] is a 73 y.o. male with a hx of CAD/NSTEMI (PCI/DES to mLAD 10/2021, RCA 12/2019), hypertension, hyperlipidemia, CKD-3, GERD, PE , chews tobacco who presents for follow-up.    Doing well, denies chest pain or shortness of breath.  Complains of easy bruising.  Compliant with medications as prescribed.  Denies any new cardiac concerns.  Prior notes Lexiscan  Myoview  05/11/2021 fixed apical defect, no evidence for ischemia.  Low risk study Echocardiogram 12/2019, normal ejection fraction 60 to 65%. Left heart cath 12/30/2019 proximal RCA 90%, proximal LAD 40%, moderate left circumflex disease.  Status post successful angioplasty and drug-eluting stent placement to proximal RCA. Easy bruisability with Brilinta   Past Medical History:  Diagnosis Date   Allergy    CAD (coronary artery disease)    a. 01/2019 MV: EF 55-65%, no ischemia; b. 12/2019 NSTEMI/PCI: LM nl, LAD 40p, 32m, D1 40, LCX nl, OM3 40, RCA 90/85p (2.75x34 Resolute Onyx DES), 48m/d.   Cataract 2023   removed in 2023   Chronic headaches    CKD (chronic kidney disease), stage III (HCC)    Edentulous    no lower teeth   GERD (gastroesophageal reflux disease)    Gout    Heart burn    History of echocardiogram    a. 12/2019 Echo: EF 60-65%, no rwma, triv MR.   Hyperlipidemia    Hypertension    Motion sickness    boats   NSTEMI (non-ST elevated myocardial infarction) (HCC) 12/27/2019   Pulmonary embolism (HCC) 05/2017   Type 2  diabetes mellitus [facility])     Past Surgical History:  Procedure Laterality Date   BACK SURGERY  02/26/2019   CATARACT EXTRACTION W/PHACO Left 04/14/2021   Procedure: CATARACT EXTRACTION PHACO AND INTRAOCULAR LENS PLACEMENT (IOC) LEFT DIABETIC maylugin;  Surgeon: Mittie Gaskin, MD;  Location: [facility];  Service: Ophthalmology;  Laterality: Left;  11.22 1:25.1   CATARACT EXTRACTION W/PHACO Right 06/09/2021   Procedure: CATARACT EXTRACTION PHACO AND INTRAOCULAR LENS PLACEMENT (IOC) RIGHT DIABETIC MALYUGIN;  Surgeon: Mittie Gaskin, MD;  Location: [facility];  Service: Ophthalmology;  Laterality: Right;  Diabetic 4.75 01:05.7   CORONARY STENT INTERVENTION N/A 10/26/2021   Procedure: CORONARY STENT INTERVENTION;  Surgeon: Mady Bruckner, MD;  Location: [facility] INVASIVE CV LAB;  Service: Cardiovascular;  Laterality: N/A;   CORONARY ULTRASOUND/IVUS N/A 10/26/2021   Procedure: Intravascular Ultrasound/IVUS;  Surgeon: Mady Bruckner, MD;  Location: [facility] INVASIVE CV LAB;  Service: Cardiovascular;  Laterality: N/A;   EYE SURGERY  see above comment   KNEE SURGERY     LEFT HEART CATH AND CORONARY ANGIOGRAPHY N/A 12/30/2019   Procedure: LEFT HEART CATH AND CORONARY ANGIOGRAPHY;  Surgeon: Darron Deatrice LABOR, MD;  Location: [facility] INVAS[facility] CV LAB;  Service: Cardiovascular;  Laterality: N/A;   LEFT HEART CATH AND CORONARY ANGIOGRAPHY N/A 10/26/2021   Procedure: LEFT HEART CATH AND CORONARY ANGIOGRAPHY;  Surgeon: Mady Bruckner, MD;  Location: [facility] INVASIVE CV LAB;  Service: Cardiovascular;  Laterality: N/A;   SHOULDER SURGERY     SPINE SURGERY  02/26/2019   TONSILLECTOMY      Current Medications: Current Meds  Medication Sig   ACCU-CHEK SMARTVIEW test strip TEST BLOOD SUGAR TWO TIMES DAILY   acetaminophen  (TYLENOL ) 500 MG tablet Take 1,000 mg by mouth every 8 (eight) hours as needed.    allopurinol  (ZYLOPRIM ) 100 MG tablet TAKE 1 TABLET AT BEDTIME   carvedilol  (COREG )  12.5 MG tablet Take 1 tablet (12.5 mg total) by mouth 2 (two) times daily.   clopidogrel  (PLAVIX ) 75 MG tablet Take 1 tablet (75 mg total) by mouth daily.   colchicine  0.6 MG tablet Take for gout flare start Day 1 with 2 tablets then may take 1 extra tablet in 1 hour. Starting Day 2 take 1 tablet daily for up to 7-10 days until resolve   DROPLET PEN NEEDLES 32G X 4 MM MISC USE WITH [name]  INSULIN  INJECTION DAILY AS INSTRUCTED   fluticasone  (FLONASE ) 50 MCG/ACT nasal spray USE 2 SPRAYS IN EACH NOSTRIL ONCE DAILY - USE FOR 4 TO 6 WEEKS, THEN STOP AND USE SEASONALLY OR AS NEEDED.   gabapentin  (NEURONTIN ) 300 MG capsule Take 1 capsule (300 mg total) by mouth at bedtime.   Insulin  Glargine ([name]  KWIKPEN) 100 UNIT/ML DIAL AND INJECT 37 UNITS UNDER THE SKIN DAILY AT BEDTIME. MAX DAILY DOSE IS 37 UNITS.   isosorbide  mononitrate (IMDUR ) 30 MG 24 hr tablet Take 0.5 tablets (15 mg total) by mouth daily.   lisinopril  (ZESTRIL ) 40 MG tablet Take 1 tablet (40 mg total) by mouth daily.   nitroGLYCERIN  (NITROSTAT ) 0.4 MG SL tablet Place 1 tablet (0.4 mg total) under the tongue every 5 (five) minutes as needed for chest pain.   pantoprazole  (PROTONIX ) 20 MG tablet TAKE 1 TABLET TWICE DAILY BEFORE MEALS   REPATHA  SURECLICK 140 MG/ML SOAJ INJECT 140 MG INTO THE SKIN EVERY 14 DAYS   Semaglutide ,0.25 or 0.5MG /DOS, (OZEMPIC , 0.25 OR 0.5 MG/DOSE,) 2 MG/3ML SOPN Inject 0.5 mg into the skin once a week.   [DISCONTINUED] aspirin  EC 81 MG EC tablet Take 1 tablet (81 mg total) by mouth daily. Swallow whole.     Allergies:   Crestor  [rosuvastatin  calcium ], Ciprofloxacin , Invokana  [canagliflozin ], Prednisone, and Sulfamethizole   Social History   Socioeconomic History   Marital status: Married    Spouse name: Not on file   Number of children: Not on file   Years of education: Not on file   Highest education level: 9th grade  Occupational History   Occupation: retired  Tobacco Use   Smoking status: Former     Current packs/day: 0.00    Types: Cigars, Cigarettes    Quit date: 03/28/1998    Years since quitting: 25.8   Smokeless tobacco: Current    Types: Chew   Tobacco comments:    wife states patient never smoked cigarettes, just chewing tobacco  Vaping Use   Vaping status: Never Used  Substance and Sexual Activity   Alcohol use: Not Currently   Drug use: No   Sexual activity: Yes    Birth control/protection: None  Other Topics Concern   Not on file  Social History Narrative   Not on file   Social Drivers of Health   Financial Resource Strain: Low Risk  (09/10/2023)   Overall Financial Resource Strain (CARDIA)    Difficulty of Paying Living Expenses: Not very hard  Food Insecurity: No Food Insecurity (09/10/2023)   Hunger Vital Sign  Worried About Programme researcher, broadcasting/film/video in the Last Year: Never true    Ran Out of Food in the Last Year: Never true  Transportation Needs: No Transportation Needs (09/10/2023)   PRAPARE - Administrator, Civil Service (Medical): No    Lack of Transportation (Non-Medical): No  Physical Activity: Insufficiently Active (09/10/2023)   Exercise Vital Sign    Days of Exercise per Week: 3 days    Minutes of Exercise per Session: 30 min  Stress: No Stress Concern Present (09/10/2023)   [employer] of Occupational Health - Occupational Stress Questionnaire    Feeling of Stress: Not at all  Social Connections: Moderately Isolated (09/10/2023)   Social Connection and Isolation Panel    Frequency of Communication with Friends and Family: More than three times a week    Frequency of Social Gatherings with Friends and Family: More than three times a week    Attends Religious Services: Never    Database administrator or Organizations: No    Attends Engineer, structural: Not on file    Marital Status: Married     Family History: The patient's family history includes Cancer in his brother and father; Heart disease in his brother, brother, and  sister. There is no history of Prostate cancer.  ROS:   Please see the history of present illness.     All other systems reviewed and are negative.  EKGs/Labs/Other Studies Reviewed:    The following studies were reviewed today:   EKG Interpretation Date/Time:  Wednesday January 10 2024 09:02:48 EDT Ventricular Rate:  67 PR Interval:  224 QRS Duration:  108 QT Interval:  420 QTC Calculation: 443 R Axis:   -54  Text Interpretation: Sinus rhythm with 1st degree A-V block Left axis deviation Incomplete right bundle branch block Confirmed by Darliss Rogue ([id number]) on 01/10/2024 9:28:48 AM    Recent Labs: 03/02/2023: ALT 13; BUN 19; Creat 1.49; Hemoglobin 13.1; Platelets 271; Potassium 4.6; Sodium 138; TSH 3.90  Recent Lipid Panel    Component Value Date/Time   CHOL 116 03/02/2023 0824   CHOL 120 12/28/2022 1002   TRIG 196 (H) 03/02/2023 0824   HDL 32 (L) 03/02/2023 0824   HDL 31 (L) 12/28/2022 1002   CHOLHDL 3.6 03/02/2023 0824   VLDL 37 12/29/2019 0026   LDLCALC 57 03/02/2023 0824    Physical Exam:    VS:  BP (!) 146/72 (BP Location: Left Arm, Patient Position: Sitting, Cuff Size: Normal)   Pulse 67   Ht 5' 11 (1.803 m)   Wt 230 lb 3.2 oz (104.4 kg)   SpO2 97%   BMI 32.11 kg/m     Wt Readings from Last 3 Encounters:  01/10/24 230 lb 3.2 oz (104.4 kg)  12/28/23 226 lb (102.5 kg)  09/14/23 225 lb (102.1 kg)     GEN:  Well nourished, well developed in no acute distress HEENT: Normal NECK: No JVD; No carotid bruits CARDIAC: RRR, no murmurs, rubs, gallops RESPIRATORY:  Clear to auscultation without rales, wheezing or rhonchi  ABDOMEN: Soft, non-tender, non-distended MUSCULOSKELETAL:  No edema; right chest wall tenderness reproducible with palpation SKIN: Warm and dry NEUROLOGIC:  Alert and oriented x 3 PSYCHIATRIC:  Normal affect   ASSESSMENT:    1. Coronary artery disease involving native coronary artery of native heart, unspecified whether angina  present   2. Primary hypertension   3. Hyperlipidemia LDL goal <70    PLAN:    CAD/NSTEMI s/p PCI (  to mLAD 10/2021, PCI to RCA 12/2019).  Denies chest pain.  Stop aspirin  due to easy bruisability.  Continue Plavix , Imdur  15 mg daily, Repatha .  echo with EF 60%. hypertension, BP elevated today, usually controlled.  Continue Coreg  12.5 mg twice daily, lisinopril  40 mg daily, Imdur  15 mg daily hyperlipidemia, continue Repatha .  History of myalgias with statins.    Follow-up in 12 months   Medication Adjustments/Labs and Tests Ordered: Current medicines are reviewed at length with the patient today.  Concerns regarding medicines are outlined above.  Orders Placed This Encounter  Procedures   EKG 12-Lead    No orders of the defined types were placed in this encounter.    Patient Instructions  Medication Instructions:  STOP the Aspirin   *If you need a refill on your cardiac medications before your next appointment, please call your pharmacy*  Lab Work: None ordered If you have labs (blood work) drawn today and your tests are completely normal, you will receive your results only by: MyChart Message (if you have MyChart) OR A paper copy in the mail If you have any lab test that is abnormal or we need to change your treatment, we will call you to review the results.  Testing/Procedures: None ordered  Follow-Up: At [facility], you and your health needs are our priority.  As part of our continuing mission to provide you with exceptional heart care, our providers are all part of one team.  This team includes your primary Cardiologist (physician) and Advanced Practice Providers or APPs (Physician Assistants and Nurse Practitioners) who all work together to provide you with the care you need, when you need it.  Your next appointment:   12 month(s)  Provider:   You may see Redell Cave, MD or one of the following Advanced Practice Providers on your designated Care Team:    Lonni Meager, NP Lesley Maffucci, PA-C Bernardino Bring, PA-C Cadence [place], PA-C Tylene Lunch, NP Barnie Hila, NP    We recommend signing up for the patient portal called MyChart.  Sign up information is provided on this After Visit Summary.  MyChart is used to connect with patients for Virtual Visits (Telemedicine).  Patients are able to view lab/test results, encounter notes, upcoming appointments, etc.  Non-urgent messages can be sent to your provider as well.   To learn more about what you can do with MyChart, go to [email or web address].              Signed, Redell Cave, MD  01/10/2024 10:07 AM    [facility]

## 2024-01-11 ENCOUNTER — Ambulatory Visit
Admission: RE | Admit: 2024-01-11 | Discharge: 2024-01-11 | Disposition: A | Discharge status: Home / Self Care | Source: Ambulatory Visit | Attending: Radiation Oncology | Admitting: Radiation Oncology

## 2024-01-12 ENCOUNTER — Other Ambulatory Visit: Payer: Self-pay | Admitting: *Deleted

## 2024-01-12 DIAGNOSIS — C61 Malignant neoplasm of prostate: Secondary | ICD-10-CM

## 2024-01-15 ENCOUNTER — Ambulatory Visit
Admission: RE | Admit: 2024-01-15 | Discharge: 2024-01-15 | Disposition: A | Discharge status: Home / Self Care | Source: Ambulatory Visit | Attending: Radiation Oncology | Admitting: Radiation Oncology

## 2024-01-15 ENCOUNTER — Other Ambulatory Visit: Payer: Self-pay

## 2024-01-15 DIAGNOSIS — Z510 Encounter for antineoplastic radiation therapy: Secondary | ICD-10-CM | POA: Diagnosis not present

## 2024-01-15 LAB — RAD ONC ARIA SESSION SUMMARY
Course Elapsed Days: 0
Plan Fractions Treated to Date: 1
Plan Prescribed Dose Per Fraction: 2 Gy
Plan Total Fractions Prescribed: 40
Plan Total Prescribed Dose: 80 Gy
Reference Point Dosage Given to Date: 2 Gy
Reference Point Session Dosage Given: 2 Gy
Session Number: 1

## 2024-01-16 ENCOUNTER — Ambulatory Visit
Admission: RE | Admit: 2024-01-16 | Discharge: 2024-01-16 | Disposition: A | Source: Ambulatory Visit | Attending: Radiation Oncology | Admitting: Radiation Oncology

## 2024-01-16 ENCOUNTER — Other Ambulatory Visit: Payer: Self-pay

## 2024-01-16 DIAGNOSIS — C61 Malignant neoplasm of prostate: Secondary | ICD-10-CM | POA: Diagnosis not present

## 2024-01-16 DIAGNOSIS — Z510 Encounter for antineoplastic radiation therapy: Secondary | ICD-10-CM | POA: Diagnosis not present

## 2024-01-16 LAB — RAD ONC ARIA SESSION SUMMARY
Course Elapsed Days: 1
Plan Fractions Treated to Date: 2
Plan Prescribed Dose Per Fraction: 2 Gy
Plan Total Fractions Prescribed: 40
Plan Total Prescribed Dose: 80 Gy
Reference Point Dosage Given to Date: 4 Gy
Reference Point Session Dosage Given: 2 Gy
Session Number: 2

## 2024-01-17 ENCOUNTER — Ambulatory Visit
Admission: RE | Admit: 2024-01-17 | Discharge: 2024-01-17 | Disposition: A | Discharge status: Home / Self Care | Source: Ambulatory Visit | Attending: Radiation Oncology | Admitting: Radiation Oncology

## 2024-01-17 ENCOUNTER — Other Ambulatory Visit: Payer: Self-pay

## 2024-01-17 DIAGNOSIS — Z510 Encounter for antineoplastic radiation therapy: Secondary | ICD-10-CM | POA: Diagnosis not present

## 2024-01-17 LAB — RAD ONC ARIA SESSION SUMMARY
Course Elapsed Days: 2
Plan Fractions Treated to Date: 3
Plan Prescribed Dose Per Fraction: 2 Gy
Plan Total Fractions Prescribed: 40
Plan Total Prescribed Dose: 80 Gy
Reference Point Dosage Given to Date: 6 Gy
Reference Point Session Dosage Given: 2 Gy
Session Number: 3

## 2024-01-18 ENCOUNTER — Other Ambulatory Visit: Payer: Self-pay

## 2024-01-18 ENCOUNTER — Ambulatory Visit
Admission: RE | Admit: 2024-01-18 | Discharge: 2024-01-18 | Disposition: A | Source: Ambulatory Visit | Attending: Radiation Oncology | Admitting: Radiation Oncology

## 2024-01-18 DIAGNOSIS — Z510 Encounter for antineoplastic radiation therapy: Secondary | ICD-10-CM | POA: Diagnosis not present

## 2024-01-18 LAB — RAD ONC ARIA SESSION SUMMARY
Course Elapsed Days: 3
Plan Fractions Treated to Date: 4
Plan Prescribed Dose Per Fraction: 2 Gy
Plan Total Fractions Prescribed: 40
Plan Total Prescribed Dose: 80 Gy
Reference Point Dosage Given to Date: 8 Gy
Reference Point Session Dosage Given: 2 Gy
Session Number: 4

## 2024-01-19 ENCOUNTER — Ambulatory Visit
Admission: RE | Admit: 2024-01-19 | Discharge: 2024-01-19 | Disposition: A | Discharge status: Home / Self Care | Source: Ambulatory Visit | Attending: Radiation Oncology | Admitting: Radiation Oncology

## 2024-01-19 ENCOUNTER — Other Ambulatory Visit: Payer: Self-pay

## 2024-01-19 DIAGNOSIS — Z510 Encounter for antineoplastic radiation therapy: Secondary | ICD-10-CM | POA: Diagnosis not present

## 2024-01-19 LAB — RAD ONC ARIA SESSION SUMMARY
Course Elapsed Days: 4
Plan Fractions Treated to Date: 5
Plan Prescribed Dose Per Fraction: 2 Gy
Plan Total Fractions Prescribed: 40
Plan Total Prescribed Dose: 80 Gy
Reference Point Dosage Given to Date: 10 Gy
Reference Point Session Dosage Given: 2 Gy
Session Number: 5

## 2024-01-22 ENCOUNTER — Other Ambulatory Visit: Payer: Self-pay

## 2024-01-22 ENCOUNTER — Ambulatory Visit
Admission: RE | Admit: 2024-01-22 | Discharge: 2024-01-22 | Disposition: A | Source: Ambulatory Visit | Attending: Radiation Oncology | Admitting: Radiation Oncology

## 2024-01-22 ENCOUNTER — Telehealth: Payer: Self-pay

## 2024-01-22 ENCOUNTER — Other Ambulatory Visit: Admitting: Pharmacist

## 2024-01-22 ENCOUNTER — Inpatient Hospital Stay

## 2024-01-22 DIAGNOSIS — I129 Hypertensive chronic kidney disease with stage 1 through stage 4 chronic kidney disease, or unspecified chronic kidney disease: Secondary | ICD-10-CM

## 2024-01-22 DIAGNOSIS — C61 Malignant neoplasm of prostate: Secondary | ICD-10-CM | POA: Diagnosis not present

## 2024-01-22 DIAGNOSIS — Z510 Encounter for antineoplastic radiation therapy: Secondary | ICD-10-CM | POA: Diagnosis not present

## 2024-01-22 DIAGNOSIS — I2510 Atherosclerotic heart disease of native coronary artery without angina pectoris: Secondary | ICD-10-CM

## 2024-01-22 DIAGNOSIS — Z794 Long term (current) use of insulin: Secondary | ICD-10-CM

## 2024-01-22 DIAGNOSIS — Z87891 Personal history of nicotine dependence: Secondary | ICD-10-CM | POA: Diagnosis not present

## 2024-01-22 DIAGNOSIS — E1169 Type 2 diabetes mellitus with other specified complication: Secondary | ICD-10-CM

## 2024-01-22 LAB — RAD ONC ARIA SESSION SUMMARY
Course Elapsed Days: 7
Plan Fractions Treated to Date: 6
Plan Prescribed Dose Per Fraction: 2 Gy
Plan Total Fractions Prescribed: 40
Plan Total Prescribed Dose: 80 Gy
Reference Point Dosage Given to Date: 12 Gy
Reference Point Session Dosage Given: 2 Gy
Session Number: 6

## 2024-01-22 LAB — CBC (CANCER CENTER ONLY)
HCT: 40.5 % (ref 39.0–52.0)
Hemoglobin: 13.4 g/dL (ref 13.0–17.0)
MCH: 29.2 pg (ref 26.0–34.0)
MCHC: 33.1 g/dL (ref 30.0–36.0)
MCV: 88.2 fL (ref 80.0–100.0)
Platelet Count: 257 K/uL (ref 150–400)
RBC: 4.59 MIL/uL (ref 4.22–5.81)
RDW: 13.3 % (ref 11.5–15.5)
WBC Count: 8 K/uL (ref 4.0–10.5)
nRBC: 0 % (ref 0.0–0.2)

## 2024-01-22 NOTE — Progress Notes (Signed)
 01/22/2024 Name: [name] MRN: [id number] DOB: 11/03/1950  Chief Complaint  Patient presents with   Medication Management   Medication Assistance    [name] is a 73 y.o. year old male who presented for a telephone visit.   They were referred to the pharmacist by their PCP for assistance in managing diabetes, hypertension, hyperlipidemia, and medication access.      Subjective:   Care Team: Primary Care Provider: Edman Marsa PARAS, DO ; Next Scheduled Visit: 03/19/2024 Cardiologist: Daniel Rogue, MD Nephrologist: Daniel Capri, MD  ; Next Scheduled Visit: 04/09/2024 Urology: [facility] Radiation Oncology: Today    Medication Access/Adherence  Current Pharmacy:  [facility] [address] [place] - [address] [place] [address] Phone: [phone] Fax: [phone]  [facility] - [place], [place] - [address] [place] [address] Phone: [phone] Fax: [phone]  [facility] [place], [place] - [address] [place] [address] Phone: [phone] Fax: [phone]   Patient reports affordability concerns with their medications: No  Patient reports access/transportation concerns to their pharmacy: No  Patient reports adherence concerns with their medications:  No     Patient using weekly pillbox   Reports started radiation for prostate cancer last week. Has noticed feeling more weak since started and wondering about impact on his blood sugar    Diabetes:   Current medications:  Insulin  glargine ([name] /Lantus ) 37 units at bedtime Ozempic  0.5 mg weekly on Tuesday Reports tolerating well   Medications tried in the past:  Trulicity  (GI intolerance) Metformin  (unable to tolerate - GI side effects) Bydureon  (difficulty with device/administration) Invokana  (felt  dehydrated) Rybelsus  (stopped with start of Ozempic )   Current glucose readings: morning fasting ranging 115-179; this morning 122; last night 170   *Attributes higher readings to eating larger carbohydrate portion sizes due to celebrations  On Friday afternoon following radiation, had a blood sugar reading of ~400 after breakfast and a doughnut. Reports since has felt motivated to work on tighter blood sugar control and is working on following a balanced diet, including eating more salads and controlling carbohydrate portion sizes   Patient denies hypoglycemic s/sx including dizziness, shakiness, sweating - Carries glucose tablets to use if needed for low blood sugar   Current physical activity: reports typically stays active throughout the day working and walking some at home     Current medication access support:  - Enrolled in patient assistance for for [name]  from [name] and Ozempic  from Novo Nordisk patient assistance programs through 03/27/2024  Reports currently has 2 month supply remaining    Hypertension:   Current medications:  Carvedilol  12.5 mg twice daily Lisinopril  40 mg daily  Imdur  30 mg -1/2 tablet (15 mg) daily   Patient has an automated, upper arm home BP cuff Reports this morning 141/98, HR 68 before medication   Denies symptoms of hypotension   Current physical activity: reports typically stays active throughout the day working and walking some at home     Hyperlipidemia/hx of CAD/NSTEMI:   Current lipid lowering medications: Repatha  140 mg every 14 days             Reports tolerating well   Previous therapies tried: atorvastatin  40 mg daily & 10 mg daily (unable to tolerate due to myalgia), ezetimibe     Have discussed importance of limiting saturated fats and trans fats in diet  Antiplatelet regimen: Clopidogrel  75 mg daily   Current physical activity: reports typically stays active throughout the day working and walking some at home   Current  medication access support: Enrolled in [employer] Hypercholesterolemia - Medicare Access grant through 03/07/2024   Objective:  Lab Results  Component Value Date   HGBA1C 8.3 (H) 09/08/2023    Lab Results  Component Value Date   CREATININE 1.49 (H) 03/02/2023   BUN 19 03/02/2023   NA 138 03/02/2023   K 4.6 03/02/2023   CL 102 03/02/2023   CO2 29 03/02/2023    Lab Results  Component Value Date   CHOL 116 03/02/2023   HDL 32 (L) 03/02/2023   LDLCALC 57 03/02/2023   TRIG 196 (H) 03/02/2023   CHOLHDL 3.6 03/02/2023   BP Readings from Last 3 Encounters:  01/10/24 (!) 146/72  12/28/23 (!) 146/76  09/14/23 (!) 120/58   Pulse Readings from Last 3 Encounters:  01/10/24 67  12/28/23 67  09/14/23 80    Medications Reviewed Today     Reviewed by [name], RPH-CPP (Pharmacist) on 01/22/24 at 1113  Med List Status: <None>   Medication Order Taking? Sig Documenting Provider Last Dose Status Informant  ACCU-CHEK SMARTVIEW test strip [id number]  TEST BLOOD SUGAR TWO TIMES DAILY Daniel Marsa PARAS, DO  Active   acetaminophen  (TYLENOL ) 500 MG tablet [id number]  Take 1,000 mg by mouth every 8 (eight) hours as needed.  [provider]  Active Spouse/Significant Other, Pharmacy Records  allopurinol  (ZYLOPRIM ) 100 MG tablet [id number]  TAKE 1 TABLET AT BEDTIME Karamalegos, Marsa PARAS, DO  Active   carvedilol  (COREG ) 12.5 MG tablet [id number] Yes Take 1 tablet (12.5 mg total) by mouth 2 (two) times daily. Daniel Marsa PARAS, DO  Active   clopidogrel  (PLAVIX ) 75 MG tablet [id number] Yes Take 1 tablet (75 mg total) by mouth daily. Daniel Marsa PARAS, DO  Active   colchicine  0.6 MG tablet [id number]  Take for gout flare start Day 1 with 2 tablets then may take 1 extra tablet in 1 hour. Starting Day 2 take 1 tablet daily for up to 7-10 days until resolve Daniel Marsa PARAS, DO  Active   DROPLET PEN NEEDLES 32G X 4 MM MISC [id number]  USE WITH [name]   INSULIN  INJECTION DAILY AS INSTRUCTED Daniel Marsa PARAS, DO  Active   fluticasone  (FLONASE ) 50 MCG/ACT nasal spray [id number]  USE 2 SPRAYS IN EACH NOSTRIL ONCE DAILY - USE FOR 4 TO 6 WEEKS, THEN STOP AND USE SEASONALLY OR AS NEEDED. Daniel Marsa PARAS, DO  Active   gabapentin  (NEURONTIN ) 300 MG capsule [id number]  Take 1 capsule (300 mg total) by mouth at bedtime. Daniel Marsa PARAS, DO  Active   Insulin  Glargine ([name]  KWIKPEN) 100 UNIT/ML [id number] Yes DIAL AND INJECT 37 UNITS UNDER THE SKIN DAILY AT BEDTIME. MAX DAILY DOSE IS 37 UNITS. Daniel Marsa PARAS, DO  Active   isosorbide  mononitrate (IMDUR ) 30 MG 24 hr tablet [id number] Yes Take 0.5 tablets (15 mg total) by mouth daily. Daniel Rogue, MD  Active   lisinopril  (ZESTRIL ) 40 MG tablet [id number] Yes Take 1 tablet (40 mg total) by mouth daily. Daniel Marsa PARAS, DO  Active   nitroGLYCERIN  (NITROSTAT ) 0.4 MG SL tablet [id number]  Place 1 tablet (0.4 mg total) under the tongue every 5 (five) minutes as needed for chest pain. Daniel Marsa PARAS, DO  Active   pantoprazole  (PROTONIX ) 20 MG tablet [id number]  TAKE 1 TABLET TWICE DAILY BEFORE MEALS Daniel, Marsa  J, DO  Active   REPATHA  SURECLICK 140 MG/ML SOAJ [id number]  INJECT 140 MG INTO THE SKIN EVERY 14 DAYS Karamalegos, Marsa PARAS, DO  Active   Semaglutide ,0.25 or 0.5MG /DOS, (OZEMPIC , 0.25 OR 0.5 MG/DOSE,) 2 MG/3ML SOPN [id number] Yes Inject 0.5 mg into the skin once a week. [provider]  Active               Assessment/Plan:   Review that Medicare's Annual Enrollment Period for 2026 started 01/10/2024. Encourage patient to review deductibles formulary coverage and copay amounts (including for Ozempic ) between plans. Have shared with patient the contact information for the [facility] Information Program [facility]) that can help with reviewing these Medicare plans   Diabetes: - Reviewed goal A1c, goal fasting, and  goal 2 hour post prandial glucose - Reviewed dietary modifications including having well-balanced meals throughout the day while controlling carbohydrate portion sizes and limiting nighttime snacking - Recommend to monitor home blood sugar, keep log of results and have this record to review during appointments, but to contact office or CM Pharmacist sooner for readings outside of established parameters or for symptoms - Denies interest in starting continuous glucose monitoring at this time - Patient to contact [name] patient assistance as needed for refills of [name]  - Will collaborate with CPhT Suzen Mall to request that she support patient with re-enrollment in [name] patient assistance for [name]  for 2026. Will also ask CPhT to help patient with requesting a refill of Ozempic  from Novo Nordisk   Hypertension: - Reviewed long term cardiovascular and renal outcomes of uncontrolled blood pressure - Reviewed appropriate blood pressure monitoring technique and reviewed goal blood pressure.  - Encourage dietary changes, including limiting salt/sodium intake and increasing intake of fruit and vegetables - Recommended to check home blood pressure and heart rate and keep a log of the results and to contact office for readings outside of established parameters   Hyperlipidemia: - Encourage patient to continue taking Repatha  as directed     Follow Up Plan: Clinical Pharmacist will follow up with patient by telephone on 02/07/2024 at 8:30 AM     Sharyle Sia, PharmD, JAQUELINE, CPP Clinical Pharmacist [facility] [phone]

## 2024-01-22 NOTE — Telephone Encounter (Signed)
 PAP: Patient assistance application for [name] through [employer] has been mailed to pt's home address on file. Provider portion of application will be faxed to provider's office.

## 2024-01-22 NOTE — Patient Instructions (Signed)
 Goals Addressed             This Visit's Progress    Pharmacy - Patient Goals       Our goal A1c is less than 7%. This corresponds with fasting sugars less than 130 and 2 hour after meal sugars less than 180. Please check your fasting blood sugar each morning and keep a record of the results  Check your blood pressure once daily, and any time you have concerning symptoms like headache, chest pain, dizziness, shortness of breath, or vision changes.   Our goal is less than 130/80.  To appropriately check your blood pressure, make sure you do the following:  1) Avoid caffeine, exercise, or tobacco products for 30 minutes before checking. Empty your bladder. 2) Sit with your back supported in a flat-backed chair. Rest your arm on something flat (arm of the chair, table, etc). 3) Sit still with your feet flat on the floor, resting, for at least 5 minutes.  4) Check your blood pressure. Take 1-2 readings.  5) Write down these readings and bring with you to any provider appointments.  Bring your home blood pressure machine with you to a provider's office for accuracy comparison at least once a year.   Make sure you take your blood pressure medications before you come to any office visit, even if you were asked to fast for labs.   Feel free to call me with any questions or concerns. I look forward to our next call  Sharyle Sia, PharmD, [facility] Clinical Pharmacist [facility] [phone]

## 2024-01-22 NOTE — Telephone Encounter (Signed)
 Completed refill form for Ozempic  and faxed to provider's office for review and signature.

## 2024-01-23 ENCOUNTER — Ambulatory Visit
Admission: RE | Admit: 2024-01-23 | Discharge: 2024-01-23 | Disposition: A | Source: Ambulatory Visit | Attending: Radiation Oncology | Admitting: Radiation Oncology

## 2024-01-23 ENCOUNTER — Other Ambulatory Visit: Payer: Self-pay

## 2024-01-23 DIAGNOSIS — Z87891 Personal history of nicotine dependence: Secondary | ICD-10-CM | POA: Diagnosis not present

## 2024-01-23 DIAGNOSIS — Z510 Encounter for antineoplastic radiation therapy: Secondary | ICD-10-CM | POA: Diagnosis not present

## 2024-01-23 DIAGNOSIS — C61 Malignant neoplasm of prostate: Secondary | ICD-10-CM | POA: Diagnosis not present

## 2024-01-23 LAB — RAD ONC ARIA SESSION SUMMARY
Course Elapsed Days: 8
Plan Fractions Treated to Date: 7
Plan Prescribed Dose Per Fraction: 2 Gy
Plan Total Fractions Prescribed: 40
Plan Total Prescribed Dose: 80 Gy
Reference Point Dosage Given to Date: 14 Gy
Reference Point Session Dosage Given: 2 Gy
Session Number: 7

## 2024-01-24 ENCOUNTER — Ambulatory Visit
Admission: RE | Admit: 2024-01-24 | Discharge: 2024-01-24 | Disposition: A | Discharge status: Home / Self Care | Source: Ambulatory Visit | Attending: Radiation Oncology | Admitting: Radiation Oncology

## 2024-01-24 ENCOUNTER — Other Ambulatory Visit: Payer: Self-pay | Admitting: Family Medicine

## 2024-01-24 ENCOUNTER — Other Ambulatory Visit: Payer: Self-pay

## 2024-01-24 DIAGNOSIS — Z510 Encounter for antineoplastic radiation therapy: Secondary | ICD-10-CM | POA: Diagnosis not present

## 2024-01-24 DIAGNOSIS — K219 Gastro-esophageal reflux disease without esophagitis: Secondary | ICD-10-CM

## 2024-01-24 DIAGNOSIS — M1000 Idiopathic gout, unspecified site: Secondary | ICD-10-CM

## 2024-01-24 LAB — RAD ONC ARIA SESSION SUMMARY
Course Elapsed Days: 9
Plan Fractions Treated to Date: 8
Plan Prescribed Dose Per Fraction: 2 Gy
Plan Total Fractions Prescribed: 40
Plan Total Prescribed Dose: 80 Gy
Reference Point Dosage Given to Date: 16 Gy
Reference Point Session Dosage Given: 2 Gy
Session Number: 8

## 2024-01-25 ENCOUNTER — Other Ambulatory Visit: Payer: Self-pay

## 2024-01-25 ENCOUNTER — Ambulatory Visit
Admission: RE | Admit: 2024-01-25 | Discharge: 2024-01-25 | Disposition: A | Discharge status: Home / Self Care | Source: Ambulatory Visit | Attending: Radiation Oncology | Admitting: Radiation Oncology

## 2024-01-25 DIAGNOSIS — Z510 Encounter for antineoplastic radiation therapy: Secondary | ICD-10-CM | POA: Diagnosis not present

## 2024-01-25 LAB — RAD ONC ARIA SESSION SUMMARY
Course Elapsed Days: 10
Plan Fractions Treated to Date: 9
Plan Prescribed Dose Per Fraction: 2 Gy
Plan Total Fractions Prescribed: 40
Plan Total Prescribed Dose: 80 Gy
Reference Point Dosage Given to Date: 18 Gy
Reference Point Session Dosage Given: 2 Gy
Session Number: 9

## 2024-01-25 NOTE — Telephone Encounter (Signed)
 Requested Prescriptions  Refused Prescriptions Disp Refills   pantoprazole  (PROTONIX ) 20 MG tablet [Pharmacy Med Name: PANTOPRAZOLE  SODIUM 20 MG Oral Tablet Delayed Release] 180 tablet 3    Sig: TAKE 1 TABLET TWICE DAILY BEFORE MEALS     Gastroenterology: Proton Pump Inhibitors Passed - 01/25/2024  3:09 PM      Passed - Valid encounter within last 12 months    Recent Outpatient Visits           4 months ago Type 2 diabetes mellitus with other specified complication, with long-term current use of insulin  [facility])   [place] [facility] [place], Marsa PARAS, DO               allopurinol  (ZYLOPRIM ) 100 MG tablet [Pharmacy Med Name: ALLOPURINOL  100 MG Oral Tablet] 90 tablet 3    Sig: TAKE 1 TABLET AT BEDTIME     Endocrinology:  Gout Agents - allopurinol  Failed - 01/25/2024  3:09 PM      Failed - Cr in normal range and within 360 days    Creat  Date Value Ref Range Status  03/02/2023 1.49 (H) 0.70 - 1.28 mg/dL Final   Creatinine, Urine  Date Value Ref Range Status  03/02/2023 128 20 - 320 mg/dL Final         Passed - Uric Acid in normal range and within 360 days    Uric Acid, Serum  Date Value Ref Range Status  03/02/2023 5.5 4.0 - 8.0 mg/dL Final    Comment:    Therapeutic target for gout patients: <6.0 mg/dL .    Uric Acid  Date Value Ref Range Status  11/12/2019 6.6 3.8 - 8.4 mg/dL Final    Comment:               Therapeutic target for gout patients: <6.0         Passed - Valid encounter within last 12 months    Recent Outpatient Visits           4 months ago Type 2 diabetes mellitus with other specified complication, with long-term current use of insulin  [facility])   [place] [facility] [place], Marsa PARAS, DO              Passed - CBC within normal limits and completed in the last 12 months    WBC  Date Value Ref Range Status  03/02/2023 6.1 3.8 - 10.8 Thousand/uL Final   WBC Count  Date Value Ref Range  Status  01/22/2024 8.0 4.0 - 10.5 K/uL Final   RBC  Date Value Ref Range Status  01/22/2024 4.59 4.22 - 5.81 MIL/uL Final   Hemoglobin  Date Value Ref Range Status  01/22/2024 13.4 13.0 - 17.0 g/dL Final  91/82/7978 86.5 13.0 - 17.7 g/dL Final   HCT  Date Value Ref Range Status  01/22/2024 40.5 39.0 - 52.0 % Final   Hematocrit  Date Value Ref Range Status  11/12/2019 42.4 37.5 - 51.0 % Final   MCHC  Date Value Ref Range Status  01/22/2024 33.1 30.0 - 36.0 g/dL Final   [facility]  Date Value Ref Range Status  01/22/2024 29.2 26.0 - 34.0 pg Final   MCV  Date Value Ref Range Status  01/22/2024 88.2 80.0 - 100.0 fL Final  11/12/2019 91 79 - 97 fL Final  10/21/2011 90 80 - 100 fL Final   No results found for: PLTCOUNTKUC, LABPLAT, POCPLA RDW  Date Value Ref Range Status  01/22/2024 13.3 11.5 - 15.5 % Final  11/12/2019 13.8 11.6 - 15.4 % Final  10/21/2011 13.9 11.5 - 14.5 % Final

## 2024-01-26 ENCOUNTER — Other Ambulatory Visit: Payer: Self-pay

## 2024-01-26 ENCOUNTER — Ambulatory Visit
Admission: RE | Admit: 2024-01-26 | Discharge: 2024-01-26 | Disposition: A | Discharge status: Home / Self Care | Source: Ambulatory Visit | Attending: Radiation Oncology | Admitting: Radiation Oncology

## 2024-01-26 DIAGNOSIS — Z510 Encounter for antineoplastic radiation therapy: Secondary | ICD-10-CM | POA: Diagnosis not present

## 2024-01-26 LAB — RAD ONC ARIA SESSION SUMMARY
Course Elapsed Days: 11
Plan Fractions Treated to Date: 10
Plan Prescribed Dose Per Fraction: 2 Gy
Plan Total Fractions Prescribed: 40
Plan Total Prescribed Dose: 80 Gy
Reference Point Dosage Given to Date: 20 Gy
Reference Point Session Dosage Given: 2 Gy
Session Number: 10

## 2024-01-29 ENCOUNTER — Other Ambulatory Visit: Payer: Self-pay

## 2024-01-29 ENCOUNTER — Ambulatory Visit
Admission: RE | Admit: 2024-01-29 | Discharge: 2024-01-29 | Disposition: A | Discharge status: Home / Self Care | Source: Ambulatory Visit | Attending: Radiation Oncology | Admitting: Radiation Oncology

## 2024-01-29 DIAGNOSIS — Z510 Encounter for antineoplastic radiation therapy: Secondary | ICD-10-CM | POA: Diagnosis not present

## 2024-01-29 DIAGNOSIS — C61 Malignant neoplasm of prostate: Secondary | ICD-10-CM | POA: Diagnosis not present

## 2024-01-29 DIAGNOSIS — Z87891 Personal history of nicotine dependence: Secondary | ICD-10-CM | POA: Diagnosis not present

## 2024-01-29 LAB — RAD ONC ARIA SESSION SUMMARY
Course Elapsed Days: 14
Plan Fractions Treated to Date: 11
Plan Prescribed Dose Per Fraction: 2 Gy
Plan Total Fractions Prescribed: 40
Plan Total Prescribed Dose: 80 Gy
Reference Point Dosage Given to Date: 22 Gy
Reference Point Session Dosage Given: 2 Gy
Session Number: 11

## 2024-01-30 ENCOUNTER — Ambulatory Visit
Admission: RE | Admit: 2024-01-30 | Discharge: 2024-01-30 | Disposition: A | Discharge status: Home / Self Care | Source: Ambulatory Visit | Attending: Radiation Oncology | Admitting: Radiation Oncology

## 2024-01-30 ENCOUNTER — Other Ambulatory Visit: Payer: Self-pay

## 2024-01-30 DIAGNOSIS — Z510 Encounter for antineoplastic radiation therapy: Secondary | ICD-10-CM | POA: Diagnosis not present

## 2024-01-30 LAB — RAD ONC ARIA SESSION SUMMARY
Course Elapsed Days: 15
Plan Fractions Treated to Date: 12
Plan Prescribed Dose Per Fraction: 2 Gy
Plan Total Fractions Prescribed: 40
Plan Total Prescribed Dose: 80 Gy
Reference Point Dosage Given to Date: 24 Gy
Reference Point Session Dosage Given: 2 Gy
Session Number: 12

## 2024-01-31 ENCOUNTER — Ambulatory Visit
Admission: RE | Admit: 2024-01-31 | Discharge: 2024-01-31 | Disposition: A | Source: Ambulatory Visit | Attending: Radiation Oncology | Admitting: Radiation Oncology

## 2024-01-31 ENCOUNTER — Other Ambulatory Visit: Payer: Self-pay

## 2024-01-31 DIAGNOSIS — C61 Malignant neoplasm of prostate: Secondary | ICD-10-CM | POA: Diagnosis not present

## 2024-01-31 DIAGNOSIS — Z510 Encounter for antineoplastic radiation therapy: Secondary | ICD-10-CM | POA: Diagnosis not present

## 2024-01-31 DIAGNOSIS — Z87891 Personal history of nicotine dependence: Secondary | ICD-10-CM | POA: Diagnosis not present

## 2024-01-31 LAB — RAD ONC ARIA SESSION SUMMARY
Course Elapsed Days: 16
Plan Fractions Treated to Date: 13
Plan Prescribed Dose Per Fraction: 2 Gy
Plan Total Fractions Prescribed: 40
Plan Total Prescribed Dose: 80 Gy
Reference Point Dosage Given to Date: 26 Gy
Reference Point Session Dosage Given: 2 Gy
Session Number: 13

## 2024-02-01 ENCOUNTER — Ambulatory Visit
Admission: RE | Admit: 2024-02-01 | Discharge: 2024-02-01 | Disposition: A | Source: Ambulatory Visit | Attending: Radiation Oncology | Admitting: Radiation Oncology

## 2024-02-01 ENCOUNTER — Other Ambulatory Visit: Payer: Self-pay

## 2024-02-01 DIAGNOSIS — Z87891 Personal history of nicotine dependence: Secondary | ICD-10-CM | POA: Diagnosis not present

## 2024-02-01 DIAGNOSIS — C61 Malignant neoplasm of prostate: Secondary | ICD-10-CM | POA: Diagnosis not present

## 2024-02-01 DIAGNOSIS — Z510 Encounter for antineoplastic radiation therapy: Secondary | ICD-10-CM | POA: Diagnosis not present

## 2024-02-01 LAB — RAD ONC ARIA SESSION SUMMARY
Course Elapsed Days: 17
Plan Fractions Treated to Date: 14
Plan Prescribed Dose Per Fraction: 2 Gy
Plan Total Fractions Prescribed: 40
Plan Total Prescribed Dose: 80 Gy
Reference Point Dosage Given to Date: 28 Gy
Reference Point Session Dosage Given: 2 Gy
Session Number: 14

## 2024-02-02 ENCOUNTER — Ambulatory Visit
Admission: RE | Admit: 2024-02-02 | Discharge: 2024-02-02 | Disposition: A | Discharge status: Home / Self Care | Source: Ambulatory Visit | Attending: Radiation Oncology | Admitting: Radiation Oncology

## 2024-02-02 ENCOUNTER — Other Ambulatory Visit: Payer: Self-pay

## 2024-02-02 DIAGNOSIS — Z510 Encounter for antineoplastic radiation therapy: Secondary | ICD-10-CM | POA: Diagnosis not present

## 2024-02-02 DIAGNOSIS — Z87891 Personal history of nicotine dependence: Secondary | ICD-10-CM | POA: Diagnosis not present

## 2024-02-02 DIAGNOSIS — C61 Malignant neoplasm of prostate: Secondary | ICD-10-CM | POA: Diagnosis not present

## 2024-02-02 LAB — RAD ONC ARIA SESSION SUMMARY
Course Elapsed Days: 18
Plan Fractions Treated to Date: 15
Plan Prescribed Dose Per Fraction: 2 Gy
Plan Total Fractions Prescribed: 40
Plan Total Prescribed Dose: 80 Gy
Reference Point Dosage Given to Date: 30 Gy
Reference Point Session Dosage Given: 2 Gy
Session Number: 15

## 2024-02-05 ENCOUNTER — Ambulatory Visit
Admission: RE | Admit: 2024-02-05 | Discharge: 2024-02-05 | Disposition: A | Discharge status: Home / Self Care | Source: Ambulatory Visit | Attending: Radiation Oncology | Admitting: Radiation Oncology

## 2024-02-05 ENCOUNTER — Other Ambulatory Visit (HOSPITAL_COMMUNITY): Payer: Self-pay

## 2024-02-05 ENCOUNTER — Inpatient Hospital Stay: Attending: Radiation Oncology

## 2024-02-05 ENCOUNTER — Other Ambulatory Visit: Payer: Self-pay

## 2024-02-05 DIAGNOSIS — Z87891 Personal history of nicotine dependence: Secondary | ICD-10-CM | POA: Diagnosis not present

## 2024-02-05 DIAGNOSIS — Z510 Encounter for antineoplastic radiation therapy: Secondary | ICD-10-CM | POA: Diagnosis not present

## 2024-02-05 DIAGNOSIS — C61 Malignant neoplasm of prostate: Secondary | ICD-10-CM | POA: Diagnosis not present

## 2024-02-05 LAB — CBC (CANCER CENTER ONLY)
HCT: 37.4 % — ABNORMAL LOW (ref 39.0–52.0)
Hemoglobin: 12.7 g/dL — ABNORMAL LOW (ref 13.0–17.0)
MCH: 30 pg (ref 26.0–34.0)
MCHC: 34 g/dL (ref 30.0–36.0)
MCV: 88.2 fL (ref 80.0–100.0)
Platelet Count: 207 K/uL (ref 150–400)
RBC: 4.24 MIL/uL (ref 4.22–5.81)
RDW: 13.5 % (ref 11.5–15.5)
WBC Count: 6.4 K/uL (ref 4.0–10.5)
nRBC: 0 % (ref 0.0–0.2)

## 2024-02-05 LAB — RAD ONC ARIA SESSION SUMMARY
Course Elapsed Days: 21
Plan Fractions Treated to Date: 16
Plan Prescribed Dose Per Fraction: 2 Gy
Plan Total Fractions Prescribed: 40
Plan Total Prescribed Dose: 80 Gy
Reference Point Dosage Given to Date: 32 Gy
Reference Point Session Dosage Given: 2 Gy
Session Number: 16

## 2024-02-05 NOTE — Telephone Encounter (Signed)
 Received patient portion PAP application for [employer]( [name] )

## 2024-02-06 ENCOUNTER — Ambulatory Visit
Admission: RE | Admit: 2024-02-06 | Discharge: 2024-02-06 | Disposition: A | Discharge status: Home / Self Care | Source: Ambulatory Visit | Attending: Radiation Oncology | Admitting: Radiation Oncology

## 2024-02-06 ENCOUNTER — Other Ambulatory Visit: Payer: Self-pay

## 2024-02-06 DIAGNOSIS — Z510 Encounter for antineoplastic radiation therapy: Secondary | ICD-10-CM | POA: Diagnosis not present

## 2024-02-06 LAB — RAD ONC ARIA SESSION SUMMARY
Course Elapsed Days: 22
Plan Fractions Treated to Date: 17
Plan Prescribed Dose Per Fraction: 2 Gy
Plan Total Fractions Prescribed: 40
Plan Total Prescribed Dose: 80 Gy
Reference Point Dosage Given to Date: 34 Gy
Reference Point Session Dosage Given: 2 Gy
Session Number: 17

## 2024-02-07 ENCOUNTER — Other Ambulatory Visit: Admitting: Pharmacist

## 2024-02-07 ENCOUNTER — Other Ambulatory Visit: Payer: Self-pay

## 2024-02-07 ENCOUNTER — Telehealth: Payer: Self-pay

## 2024-02-07 ENCOUNTER — Ambulatory Visit
Admission: RE | Admit: 2024-02-07 | Discharge: 2024-02-07 | Disposition: A | Discharge status: Home / Self Care | Source: Ambulatory Visit | Attending: Radiation Oncology | Admitting: Radiation Oncology

## 2024-02-07 DIAGNOSIS — Z510 Encounter for antineoplastic radiation therapy: Secondary | ICD-10-CM | POA: Diagnosis not present

## 2024-02-07 DIAGNOSIS — Z794 Long term (current) use of insulin: Secondary | ICD-10-CM

## 2024-02-07 DIAGNOSIS — I2510 Atherosclerotic heart disease of native coronary artery without angina pectoris: Secondary | ICD-10-CM

## 2024-02-07 DIAGNOSIS — E1169 Type 2 diabetes mellitus with other specified complication: Secondary | ICD-10-CM

## 2024-02-07 LAB — RAD ONC ARIA SESSION SUMMARY
Course Elapsed Days: 23
Plan Fractions Treated to Date: 18
Plan Prescribed Dose Per Fraction: 2 Gy
Plan Total Fractions Prescribed: 40
Plan Total Prescribed Dose: 80 Gy
Reference Point Dosage Given to Date: 36 Gy
Reference Point Session Dosage Given: 2 Gy
Session Number: 18

## 2024-02-07 NOTE — Progress Notes (Signed)
 02/07/2024 Name: [name] MRN: [id number] DOB: Jan 22, 1951  Chief Complaint  Patient presents with   Medication Assistance    [name] is a 73 y.o. year old male who presented for a telephone visit.   They were referred to the pharmacist by their PCP for assistance in managing diabetes, hypertension, hyperlipidemia, and medication access.    Follow up today as planned to complete [employer] grant re-enrollment   Subjective:   Care Team: Primary Care Provider: Edman Marsa PARAS, DO ; Next Scheduled Visit: 03/19/2024 Cardiologist: Darliss Rogue, MD Nephrologist: Dennise Capri, MD  ; Next Scheduled Visit: 04/09/2024 Urology: [facility] Radiation Oncology: Today  Medication Access/Adherence  Current Pharmacy:  [facility] [address] [place] - [address] [place] [address] Phone: [phone] Fax: [phone]  [facility] - [place], [place] - [address] [place] [address] Phone: [phone] Fax: [phone]  [facility] [place], [place] - [address] [place] [address] Phone: [phone] Fax: [phone]   Patient reports affordability concerns with their medications: No  Patient reports access/transportation concerns to their pharmacy: No  Patient reports adherence concerns with their medications:  No     Patient using weekly pillbox   Reports continuing radiation for prostate cancer with [facility] as planned and tolerating well   Reports has signed up for a [facility] Medicare plan for 2026, but does not have the specific plan name today   Diabetes:   Current medications:  Insulin  glargine ([name] /Lantus ) 37 units at bedtime Ozempic  0.5 mg weekly on Tuesday Reports tolerating well   Medications tried in the past:  Trulicity  (GI intolerance) Metformin  (unable  to tolerate - GI side effects) Bydureon  (difficulty with device/administration) Invokana  (felt dehydrated) Rybelsus  (stopped with start of Ozempic )   Current glucose readings: morning fasting ranging 101-156; this morning 101   Patient denies hypoglycemic s/sx including dizziness, shakiness, sweating - Carries glucose tablets to use if needed for low blood sugar   Current physical activity: reports typically stays active throughout the day working and walking some at home     Current medication access support:  - Enrolled in patient assistance for for [name]  from [name] and Ozempic  from [employer] patient assistance programs through 03/27/2024 [employer] has announced that they will no longer offer Ozempic  to Medicare beneficiaries through their Patient Assistance Program in 2026   Reports currently has 4 month supply remaining      Hyperlipidemia/hx of CAD/NSTEMI:   Current lipid lowering medications: Repatha  140 mg every 14 days             Reports tolerating well   Previous therapies tried: atorvastatin  40 mg daily & 10 mg daily (unable to tolerate due to myalgia), ezetimibe     Have discussed importance of limiting saturated fats and trans fats in diet   Antiplatelet regimen: Clopidogrel  75 mg daily   Current physical activity: reports typically stays active throughout the day working and walking some at home   Current medication access support: Enrolled in [employer] Hypercholesterolemia - Medicare [employer]. Spouse completed re-enrollment online this morning. Now enrolled through 03/07/2025   Objective:  Lab Results  Component Value Date   HGBA1C 8.3 (H) 09/08/2023    Lab Results  Component Value Date   CREATININE 1.49 (H) 03/02/2023   BUN 19 03/02/2023   NA 138 03/02/2023   K 4.6  03/02/2023   CL 102 03/02/2023   CO2 29 03/02/2023    Lab Results  Component Value Date   CHOL 116 03/02/2023   HDL 32 (L) 03/02/2023   LDLCALC 57 03/02/2023   TRIG 196  (H) 03/02/2023   CHOLHDL 3.6 03/02/2023    Medications Reviewed Today     Reviewed by Alana Sharyle LABOR, RPH-CPP (Pharmacist) on 02/07/24 at 0900  Med List Status: <None>   Medication Order Taking? Sig Documenting Provider Last Dose Status Informant  ACCU-CHEK SMARTVIEW test strip [id number]  TEST BLOOD SUGAR TWO TIMES DAILY Edman Marsa PARAS, DO  Active   acetaminophen  (TYLENOL ) 500 MG tablet [id number]  Take 1,000 mg by mouth every 8 (eight) hours as needed.  [provider]  Active Spouse/Significant Other, Pharmacy Records  allopurinol  (ZYLOPRIM ) 100 MG tablet [id number]  TAKE 1 TABLET AT BEDTIME Karamalegos, Marsa PARAS, DO  Active   carvedilol  (COREG ) 12.5 MG tablet [id number]  Take 1 tablet (12.5 mg total) by mouth 2 (two) times daily. Edman Marsa PARAS, DO  Active   clopidogrel  (PLAVIX ) 75 MG tablet [id number]  Take 1 tablet (75 mg total) by mouth daily. Edman Marsa PARAS, DO  Active   colchicine  0.6 MG tablet [id number]  Take for gout flare start Day 1 with 2 tablets then may take 1 extra tablet in 1 hour. Starting Day 2 take 1 tablet daily for up to 7-10 days until resolve Edman Marsa PARAS, DO  Active   DROPLET PEN NEEDLES 32G X 4 MM MISC [id number]  USE WITH [name]  INSULIN  INJECTION DAILY AS INSTRUCTED Edman Marsa PARAS, DO  Active   fluticasone  (FLONASE ) 50 MCG/ACT nasal spray [id number]  USE 2 SPRAYS IN EACH NOSTRIL ONCE DAILY - USE FOR 4 TO 6 WEEKS, THEN STOP AND USE SEASONALLY OR AS NEEDED. Edman Marsa PARAS, DO  Active   gabapentin  (NEURONTIN ) 300 MG capsule [id number]  Take 1 capsule (300 mg total) by mouth at bedtime. Edman Marsa PARAS, DO  Active   Insulin  Glargine ([name]  KWIKPEN) 100 UNIT/ML [id number] Yes DIAL AND INJECT 37 UNITS UNDER THE SKIN DAILY AT BEDTIME. MAX DAILY DOSE IS 37 UNITS. Edman Marsa PARAS, DO  Active   isosorbide  mononitrate (IMDUR ) 30 MG 24 hr tablet [id number]  Take 0.5 tablets (15 mg total) by  mouth daily. Darliss Rogue, MD  Active   lisinopril  (ZESTRIL ) 40 MG tablet [id number]  Take 1 tablet (40 mg total) by mouth daily. Edman Marsa PARAS, DO  Active   nitroGLYCERIN  (NITROSTAT ) 0.4 MG SL tablet [id number]  Place 1 tablet (0.4 mg total) under the tongue every 5 (five) minutes as needed for chest pain. Edman Marsa PARAS, DO  Active   pantoprazole  (PROTONIX ) 20 MG tablet [id number]  TAKE 1 TABLET TWICE DAILY BEFORE MEALS Edman Marsa PARAS, DO  Active   REPATHA  SURECLICK 140 MG/ML SOAJ [id number] Yes INJECT 140 MG INTO THE SKIN EVERY 14 DAYS Karamalegos, Marsa PARAS, DO  Active   Semaglutide ,0.25 or 0.5MG /DOS, (OZEMPIC , 0.25 OR 0.5 MG/DOSE,) 2 MG/3ML SOPN [id number] Yes Inject 0.5 mg into the skin once a week. [provider]  Active               Assessment/Plan:   Send message to PCP as patient requesting provider review his latest lab results from 02/05/2024 as taken by [facility]   Diabetes: - Reviewed goal A1c, goal fasting, and goal 2 hour post prandial glucose - Have reviewed dietary modifications including having well-balanced meals throughout the day  while controlling carbohydrate portion sizes and limiting nighttime snacking - Recommend to monitor home blood sugar, keep log of results and have this record to review during appointments, but to contact office or CM Pharmacist sooner for readings outside of established parameters or for symptoms - Patient to contact [name] patient assistance as needed for refills of [name]  - Collaborating with CPhT Suzen Mall to request that she support patient with re-enrollment in [name] patient assistance for [name]  for 2026   Hypertension: - Reviewed long term cardiovascular and renal outcomes of uncontrolled blood pressure - Reviewed appropriate blood pressure monitoring technique and reviewed goal blood pressure.  - Encourage dietary changes, including limiting salt/sodium intake and increasing  intake of fruit and vegetables - Recommended to check home blood pressure and heart rate and keep a log of the results and to contact office for readings outside of established parameters   Hyperlipidemia: - Encourage patient to continue taking Repatha  as directed     Follow Up Plan: Clinical Pharmacist will follow up with patient by telephone on 04/17/2024 at 8:30 AM      Sharyle Sia, PharmD, JAQUELINE, CPP Clinical Pharmacist [facility] [phone]

## 2024-02-07 NOTE — Telephone Encounter (Signed)
 Copied from CRM #[id number]. Topic: General - Other >> Feb 07, 2024 10:42 AM Hadassah PARAS wrote: Reason for CRM: Luke from Medication Assist team sent over a fax for [employer] for insulin  in error. Please disregard as she found the other paperwork

## 2024-02-07 NOTE — Patient Instructions (Signed)
 Goals Addressed             This Visit's Progress    Pharmacy - Patient Goals       Our goal A1c is less than 7%. This corresponds with fasting sugars less than 130 and 2 hour after meal sugars less than 180. Please check your fasting blood sugar each morning and keep a record of the results  Check your blood pressure once daily, and any time you have concerning symptoms like headache, chest pain, dizziness, shortness of breath, or vision changes.   Our goal is less than 130/80.  To appropriately check your blood pressure, make sure you do the following:  1) Avoid caffeine, exercise, or tobacco products for 30 minutes before checking. Empty your bladder. 2) Sit with your back supported in a flat-backed chair. Rest your arm on something flat (arm of the chair, table, etc). 3) Sit still with your feet flat on the floor, resting, for at least 5 minutes.  4) Check your blood pressure. Take 1-2 readings.  5) Write down these readings and bring with you to any provider appointments.  Bring your home blood pressure machine with you to a provider's office for accuracy comparison at least once a year.   Make sure you take your blood pressure medications before you come to any office visit, even if you were asked to fast for labs.   Feel free to call me with any questions or concerns. I look forward to our next call  Sharyle Sia, PharmD, [facility] Clinical Pharmacist [facility] [phone]

## 2024-02-08 ENCOUNTER — Ambulatory Visit
Admission: RE | Admit: 2024-02-08 | Discharge: 2024-02-08 | Disposition: A | Discharge status: Home / Self Care | Source: Ambulatory Visit | Attending: Radiation Oncology | Admitting: Radiation Oncology

## 2024-02-08 ENCOUNTER — Other Ambulatory Visit: Payer: Self-pay

## 2024-02-08 DIAGNOSIS — C61 Malignant neoplasm of prostate: Secondary | ICD-10-CM | POA: Diagnosis not present

## 2024-02-08 DIAGNOSIS — Z510 Encounter for antineoplastic radiation therapy: Secondary | ICD-10-CM | POA: Diagnosis not present

## 2024-02-08 LAB — RAD ONC ARIA SESSION SUMMARY
Course Elapsed Days: 24
Plan Fractions Treated to Date: 19
Plan Prescribed Dose Per Fraction: 2 Gy
Plan Total Fractions Prescribed: 40
Plan Total Prescribed Dose: 80 Gy
Reference Point Dosage Given to Date: 38 Gy
Reference Point Session Dosage Given: 2 Gy
Session Number: 19

## 2024-02-09 ENCOUNTER — Ambulatory Visit
Admission: RE | Admit: 2024-02-09 | Discharge: 2024-02-09 | Disposition: A | Discharge status: Home / Self Care | Source: Ambulatory Visit | Attending: Radiation Oncology | Admitting: Radiation Oncology

## 2024-02-09 ENCOUNTER — Other Ambulatory Visit: Payer: Self-pay | Admitting: Pharmacist

## 2024-02-09 ENCOUNTER — Other Ambulatory Visit: Payer: Self-pay

## 2024-02-09 DIAGNOSIS — Z794 Long term (current) use of insulin: Secondary | ICD-10-CM

## 2024-02-09 DIAGNOSIS — Z87891 Personal history of nicotine dependence: Secondary | ICD-10-CM | POA: Diagnosis not present

## 2024-02-09 DIAGNOSIS — Z510 Encounter for antineoplastic radiation therapy: Secondary | ICD-10-CM | POA: Diagnosis not present

## 2024-02-09 DIAGNOSIS — E1169 Type 2 diabetes mellitus with other specified complication: Secondary | ICD-10-CM

## 2024-02-09 DIAGNOSIS — C61 Malignant neoplasm of prostate: Secondary | ICD-10-CM | POA: Diagnosis not present

## 2024-02-09 LAB — RAD ONC ARIA SESSION SUMMARY
Course Elapsed Days: 25
Plan Fractions Treated to Date: 20
Plan Prescribed Dose Per Fraction: 2 Gy
Plan Total Fractions Prescribed: 40
Plan Total Prescribed Dose: 80 Gy
Reference Point Dosage Given to Date: 40 Gy
Reference Point Session Dosage Given: 2 Gy
Session Number: 20

## 2024-02-09 NOTE — Progress Notes (Signed)
   02/09/2024  Patient ID: [name], male   DOB: [birth year], 73 y.o.   MRN: [id number]  Return a call to patient/spouse regarding medication access/health plan coverage for 2026 calendar year.  Reports patient signed up for [employer] Medicare plan for 2026 calendar year. - Counsel that per [employer], plan has a $250 annual prescription deductible (impacting tiers 3-5) and $47/month tier 3 copayment (for medications including Ozempic  and Repatha ).  Note patient enrolled in [facility] grant for Repatha  for copayment assistance.   Follow Up Plan: Clinical Pharmacist will follow up with patient by telephone on 04/17/2024 at 8:30 AM      Sharyle Sia, PharmD, JAQUELINE, CPP Clinical Pharmacist [facility] [phone]

## 2024-02-12 ENCOUNTER — Ambulatory Visit
Admission: RE | Admit: 2024-02-12 | Discharge: 2024-02-12 | Disposition: A | Discharge status: Home / Self Care | Source: Ambulatory Visit | Attending: Radiation Oncology | Admitting: Radiation Oncology

## 2024-02-12 ENCOUNTER — Other Ambulatory Visit: Payer: Self-pay

## 2024-02-12 DIAGNOSIS — Z87891 Personal history of nicotine dependence: Secondary | ICD-10-CM | POA: Diagnosis not present

## 2024-02-12 DIAGNOSIS — C61 Malignant neoplasm of prostate: Secondary | ICD-10-CM | POA: Diagnosis not present

## 2024-02-12 DIAGNOSIS — Z510 Encounter for antineoplastic radiation therapy: Secondary | ICD-10-CM | POA: Diagnosis not present

## 2024-02-12 LAB — RAD ONC ARIA SESSION SUMMARY
Course Elapsed Days: 28
Plan Fractions Treated to Date: 21
Plan Prescribed Dose Per Fraction: 2 Gy
Plan Total Fractions Prescribed: 40
Plan Total Prescribed Dose: 80 Gy
Reference Point Dosage Given to Date: 42 Gy
Reference Point Session Dosage Given: 2 Gy
Session Number: 21

## 2024-02-13 ENCOUNTER — Other Ambulatory Visit: Payer: Self-pay

## 2024-02-13 ENCOUNTER — Other Ambulatory Visit: Payer: Self-pay | Admitting: *Deleted

## 2024-02-13 ENCOUNTER — Ambulatory Visit
Admission: RE | Admit: 2024-02-13 | Discharge: 2024-02-13 | Disposition: A | Source: Ambulatory Visit | Attending: Radiation Oncology | Admitting: Radiation Oncology

## 2024-02-13 DIAGNOSIS — Z510 Encounter for antineoplastic radiation therapy: Secondary | ICD-10-CM | POA: Diagnosis not present

## 2024-02-13 DIAGNOSIS — C61 Malignant neoplasm of prostate: Secondary | ICD-10-CM | POA: Diagnosis not present

## 2024-02-13 DIAGNOSIS — Z87891 Personal history of nicotine dependence: Secondary | ICD-10-CM | POA: Diagnosis not present

## 2024-02-13 LAB — RAD ONC ARIA SESSION SUMMARY
Course Elapsed Days: 29
Plan Fractions Treated to Date: 22
Plan Prescribed Dose Per Fraction: 2 Gy
Plan Total Fractions Prescribed: 40
Plan Total Prescribed Dose: 80 Gy
Reference Point Dosage Given to Date: 44 Gy
Reference Point Session Dosage Given: 2 Gy
Session Number: 22

## 2024-02-13 MED ORDER — TAMSULOSIN HCL 0.4 MG PO CAPS: 0.4000 mg | ORAL_CAPSULE | Freq: Every day | ORAL | 0 refills | Status: DC

## 2024-02-14 ENCOUNTER — Other Ambulatory Visit: Payer: Self-pay

## 2024-02-14 ENCOUNTER — Ambulatory Visit
Admission: RE | Admit: 2024-02-14 | Discharge: 2024-02-14 | Disposition: A | Source: Ambulatory Visit | Attending: Radiation Oncology | Admitting: Radiation Oncology

## 2024-02-14 DIAGNOSIS — C61 Malignant neoplasm of prostate: Secondary | ICD-10-CM | POA: Diagnosis not present

## 2024-02-14 DIAGNOSIS — Z510 Encounter for antineoplastic radiation therapy: Secondary | ICD-10-CM | POA: Diagnosis not present

## 2024-02-14 DIAGNOSIS — Z87891 Personal history of nicotine dependence: Secondary | ICD-10-CM | POA: Diagnosis not present

## 2024-02-14 LAB — RAD ONC ARIA SESSION SUMMARY
Course Elapsed Days: 30
Plan Fractions Treated to Date: 23
Plan Prescribed Dose Per Fraction: 2 Gy
Plan Total Fractions Prescribed: 40
Plan Total Prescribed Dose: 80 Gy
Reference Point Dosage Given to Date: 46 Gy
Reference Point Session Dosage Given: 2 Gy
Session Number: 23

## 2024-02-15 ENCOUNTER — Ambulatory Visit
Admission: RE | Admit: 2024-02-15 | Discharge: 2024-02-15 | Disposition: A | Discharge status: Home / Self Care | Source: Ambulatory Visit | Attending: Radiation Oncology | Admitting: Radiation Oncology

## 2024-02-15 ENCOUNTER — Other Ambulatory Visit: Payer: Self-pay

## 2024-02-15 DIAGNOSIS — Z510 Encounter for antineoplastic radiation therapy: Secondary | ICD-10-CM | POA: Diagnosis not present

## 2024-02-15 LAB — RAD ONC ARIA SESSION SUMMARY
Course Elapsed Days: 31
Plan Fractions Treated to Date: 24
Plan Prescribed Dose Per Fraction: 2 Gy
Plan Total Fractions Prescribed: 40
Plan Total Prescribed Dose: 80 Gy
Reference Point Dosage Given to Date: 48 Gy
Reference Point Session Dosage Given: 2 Gy
Session Number: 24

## 2024-02-16 ENCOUNTER — Ambulatory Visit
Admission: RE | Admit: 2024-02-16 | Discharge: 2024-02-16 | Disposition: A | Discharge status: Home / Self Care | Source: Ambulatory Visit | Attending: Radiation Oncology | Admitting: Radiation Oncology

## 2024-02-16 ENCOUNTER — Other Ambulatory Visit: Payer: Self-pay

## 2024-02-16 DIAGNOSIS — Z510 Encounter for antineoplastic radiation therapy: Secondary | ICD-10-CM | POA: Diagnosis not present

## 2024-02-16 LAB — RAD ONC ARIA SESSION SUMMARY
Course Elapsed Days: 32
Plan Fractions Treated to Date: 25
Plan Prescribed Dose Per Fraction: 2 Gy
Plan Total Fractions Prescribed: 40
Plan Total Prescribed Dose: 80 Gy
Reference Point Dosage Given to Date: 50 Gy
Reference Point Session Dosage Given: 2 Gy
Session Number: 25

## 2024-02-19 ENCOUNTER — Other Ambulatory Visit: Payer: Self-pay

## 2024-02-19 ENCOUNTER — Ambulatory Visit
Admission: RE | Admit: 2024-02-19 | Discharge: 2024-02-19 | Disposition: A | Discharge status: Home / Self Care | Source: Ambulatory Visit | Attending: Radiation Oncology | Admitting: Radiation Oncology

## 2024-02-19 ENCOUNTER — Inpatient Hospital Stay

## 2024-02-19 DIAGNOSIS — C61 Malignant neoplasm of prostate: Secondary | ICD-10-CM

## 2024-02-19 DIAGNOSIS — Z510 Encounter for antineoplastic radiation therapy: Secondary | ICD-10-CM | POA: Diagnosis not present

## 2024-02-19 LAB — RAD ONC ARIA SESSION SUMMARY
Course Elapsed Days: 35
Plan Fractions Treated to Date: 26
Plan Prescribed Dose Per Fraction: 2 Gy
Plan Total Fractions Prescribed: 40
Plan Total Prescribed Dose: 80 Gy
Reference Point Dosage Given to Date: 52 Gy
Reference Point Session Dosage Given: 2 Gy
Session Number: 26

## 2024-02-19 LAB — CBC (CANCER CENTER ONLY)
HCT: 35.8 % — ABNORMAL LOW (ref 39.0–52.0)
Hemoglobin: 12.2 g/dL — ABNORMAL LOW (ref 13.0–17.0)
MCH: 29.7 pg (ref 26.0–34.0)
MCHC: 34.1 g/dL (ref 30.0–36.0)
MCV: 87.1 fL (ref 80.0–100.0)
Platelet Count: 190 K/uL (ref 150–400)
RBC: 4.11 MIL/uL — ABNORMAL LOW (ref 4.22–5.81)
RDW: 14 % (ref 11.5–15.5)
WBC Count: 6.8 K/uL (ref 4.0–10.5)
nRBC: 0 % (ref 0.0–0.2)

## 2024-02-20 ENCOUNTER — Ambulatory Visit
Admission: RE | Admit: 2024-02-20 | Discharge: 2024-02-20 | Disposition: A | Discharge status: Home / Self Care | Source: Ambulatory Visit | Attending: Radiation Oncology | Admitting: Radiation Oncology

## 2024-02-20 ENCOUNTER — Other Ambulatory Visit: Payer: Self-pay

## 2024-02-20 DIAGNOSIS — Z510 Encounter for antineoplastic radiation therapy: Secondary | ICD-10-CM | POA: Diagnosis not present

## 2024-02-20 LAB — RAD ONC ARIA SESSION SUMMARY
Course Elapsed Days: 36
Plan Fractions Treated to Date: 27
Plan Prescribed Dose Per Fraction: 2 Gy
Plan Total Fractions Prescribed: 40
Plan Total Prescribed Dose: 80 Gy
Reference Point Dosage Given to Date: 54 Gy
Reference Point Session Dosage Given: 2 Gy
Session Number: 27

## 2024-02-21 ENCOUNTER — Other Ambulatory Visit: Payer: Self-pay

## 2024-02-21 ENCOUNTER — Ambulatory Visit
Admission: RE | Admit: 2024-02-21 | Discharge: 2024-02-21 | Disposition: A | Discharge status: Home / Self Care | Source: Ambulatory Visit | Attending: Radiation Oncology | Admitting: Radiation Oncology

## 2024-02-21 DIAGNOSIS — Z87891 Personal history of nicotine dependence: Secondary | ICD-10-CM | POA: Diagnosis not present

## 2024-02-21 DIAGNOSIS — Z510 Encounter for antineoplastic radiation therapy: Secondary | ICD-10-CM | POA: Diagnosis not present

## 2024-02-21 DIAGNOSIS — C61 Malignant neoplasm of prostate: Secondary | ICD-10-CM | POA: Diagnosis not present

## 2024-02-21 LAB — RAD ONC ARIA SESSION SUMMARY
Course Elapsed Days: 37
Plan Fractions Treated to Date: 28
Plan Prescribed Dose Per Fraction: 2 Gy
Plan Total Fractions Prescribed: 40
Plan Total Prescribed Dose: 80 Gy
Reference Point Dosage Given to Date: 56 Gy
Reference Point Session Dosage Given: 2 Gy
Session Number: 28

## 2024-02-26 ENCOUNTER — Other Ambulatory Visit: Payer: Self-pay

## 2024-02-26 ENCOUNTER — Ambulatory Visit
Admission: RE | Admit: 2024-02-26 | Discharge: 2024-02-26 | Disposition: A | Source: Ambulatory Visit | Attending: Radiation Oncology | Admitting: Radiation Oncology

## 2024-02-26 DIAGNOSIS — Z510 Encounter for antineoplastic radiation therapy: Secondary | ICD-10-CM | POA: Insufficient documentation

## 2024-02-26 DIAGNOSIS — C61 Malignant neoplasm of prostate: Secondary | ICD-10-CM | POA: Diagnosis present

## 2024-02-26 DIAGNOSIS — Z87891 Personal history of nicotine dependence: Secondary | ICD-10-CM | POA: Insufficient documentation

## 2024-02-26 LAB — RAD ONC ARIA SESSION SUMMARY
Course Elapsed Days: 42
Plan Fractions Treated to Date: 29
Plan Prescribed Dose Per Fraction: 2 Gy
Plan Total Fractions Prescribed: 40
Plan Total Prescribed Dose: 80 Gy
Reference Point Dosage Given to Date: 58 Gy
Reference Point Session Dosage Given: 2 Gy
Session Number: 29

## 2024-02-27 ENCOUNTER — Ambulatory Visit
Admission: RE | Admit: 2024-02-27 | Discharge: 2024-02-27 | Disposition: A | Source: Ambulatory Visit | Attending: Radiation Oncology | Admitting: Radiation Oncology

## 2024-02-27 ENCOUNTER — Other Ambulatory Visit: Payer: Self-pay

## 2024-02-27 DIAGNOSIS — Z510 Encounter for antineoplastic radiation therapy: Secondary | ICD-10-CM | POA: Diagnosis not present

## 2024-02-27 LAB — RAD ONC ARIA SESSION SUMMARY
Course Elapsed Days: 43
Plan Fractions Treated to Date: 30
Plan Prescribed Dose Per Fraction: 2 Gy
Plan Total Fractions Prescribed: 40
Plan Total Prescribed Dose: 80 Gy
Reference Point Dosage Given to Date: 60 Gy
Reference Point Session Dosage Given: 2 Gy
Session Number: 30

## 2024-02-28 ENCOUNTER — Ambulatory Visit
Admission: RE | Admit: 2024-02-28 | Discharge: 2024-02-28 | Disposition: A | Source: Ambulatory Visit | Attending: Radiation Oncology | Admitting: Radiation Oncology

## 2024-02-28 ENCOUNTER — Other Ambulatory Visit: Payer: Self-pay

## 2024-02-28 DIAGNOSIS — Z510 Encounter for antineoplastic radiation therapy: Secondary | ICD-10-CM | POA: Diagnosis not present

## 2024-02-28 LAB — RAD ONC ARIA SESSION SUMMARY
Course Elapsed Days: 44
Plan Fractions Treated to Date: 31
Plan Prescribed Dose Per Fraction: 2 Gy
Plan Total Fractions Prescribed: 40
Plan Total Prescribed Dose: 80 Gy
Reference Point Dosage Given to Date: 62 Gy
Reference Point Session Dosage Given: 2 Gy
Session Number: 31

## 2024-02-29 ENCOUNTER — Ambulatory Visit
Admission: RE | Admit: 2024-02-29 | Discharge: 2024-02-29 | Disposition: A | Source: Ambulatory Visit | Attending: Radiation Oncology | Admitting: Radiation Oncology

## 2024-02-29 ENCOUNTER — Other Ambulatory Visit: Payer: Self-pay

## 2024-02-29 DIAGNOSIS — Z510 Encounter for antineoplastic radiation therapy: Secondary | ICD-10-CM | POA: Diagnosis not present

## 2024-02-29 LAB — RAD ONC ARIA SESSION SUMMARY
Course Elapsed Days: 45
Plan Fractions Treated to Date: 32
Plan Prescribed Dose Per Fraction: 2 Gy
Plan Total Fractions Prescribed: 40
Plan Total Prescribed Dose: 80 Gy
Reference Point Dosage Given to Date: 64 Gy
Reference Point Session Dosage Given: 2 Gy
Session Number: 32

## 2024-03-01 ENCOUNTER — Ambulatory Visit
Admission: RE | Admit: 2024-03-01 | Discharge: 2024-03-01 | Disposition: A | Source: Ambulatory Visit | Attending: Radiation Oncology | Admitting: Radiation Oncology

## 2024-03-01 ENCOUNTER — Other Ambulatory Visit: Payer: Self-pay

## 2024-03-01 DIAGNOSIS — Z510 Encounter for antineoplastic radiation therapy: Secondary | ICD-10-CM | POA: Diagnosis not present

## 2024-03-01 LAB — RAD ONC ARIA SESSION SUMMARY
Course Elapsed Days: 46
Plan Fractions Treated to Date: 33
Plan Prescribed Dose Per Fraction: 2 Gy
Plan Total Fractions Prescribed: 40
Plan Total Prescribed Dose: 80 Gy
Reference Point Dosage Given to Date: 66 Gy
Reference Point Session Dosage Given: 2 Gy
Session Number: 33

## 2024-03-04 ENCOUNTER — Ambulatory Visit
Admission: RE | Admit: 2024-03-04 | Discharge: 2024-03-04 | Disposition: A | Source: Ambulatory Visit | Attending: Radiation Oncology | Admitting: Radiation Oncology

## 2024-03-04 ENCOUNTER — Other Ambulatory Visit: Payer: Self-pay

## 2024-03-04 ENCOUNTER — Inpatient Hospital Stay: Attending: Radiation Oncology

## 2024-03-04 DIAGNOSIS — C61 Malignant neoplasm of prostate: Secondary | ICD-10-CM | POA: Insufficient documentation

## 2024-03-04 DIAGNOSIS — Z510 Encounter for antineoplastic radiation therapy: Secondary | ICD-10-CM | POA: Diagnosis not present

## 2024-03-04 LAB — CBC (CANCER CENTER ONLY)
HCT: 33.7 % — ABNORMAL LOW (ref 39.0–52.0)
Hemoglobin: 11.3 g/dL — ABNORMAL LOW (ref 13.0–17.0)
MCH: 30.1 pg (ref 26.0–34.0)
MCHC: 33.5 g/dL (ref 30.0–36.0)
MCV: 89.6 fL (ref 80.0–100.0)
Platelet Count: 165 K/uL (ref 150–400)
RBC: 3.76 MIL/uL — ABNORMAL LOW (ref 4.22–5.81)
RDW: 14.2 % (ref 11.5–15.5)
WBC Count: 4.5 K/uL (ref 4.0–10.5)
nRBC: 0 % (ref 0.0–0.2)

## 2024-03-04 LAB — RAD ONC ARIA SESSION SUMMARY
Course Elapsed Days: 49
Plan Fractions Treated to Date: 34
Plan Prescribed Dose Per Fraction: 2 Gy
Plan Total Fractions Prescribed: 40
Plan Total Prescribed Dose: 80 Gy
Reference Point Dosage Given to Date: 68 Gy
Reference Point Session Dosage Given: 2 Gy
Session Number: 34

## 2024-03-05 ENCOUNTER — Other Ambulatory Visit: Payer: Self-pay

## 2024-03-05 ENCOUNTER — Ambulatory Visit
Admission: RE | Admit: 2024-03-05 | Discharge: 2024-03-05 | Disposition: A | Source: Ambulatory Visit | Attending: Radiation Oncology | Admitting: Radiation Oncology

## 2024-03-05 DIAGNOSIS — Z510 Encounter for antineoplastic radiation therapy: Secondary | ICD-10-CM | POA: Diagnosis not present

## 2024-03-05 LAB — RAD ONC ARIA SESSION SUMMARY
Course Elapsed Days: 50
Plan Fractions Treated to Date: 35
Plan Prescribed Dose Per Fraction: 2 Gy
Plan Total Fractions Prescribed: 40
Plan Total Prescribed Dose: 80 Gy
Reference Point Dosage Given to Date: 70 Gy
Reference Point Session Dosage Given: 2 Gy
Session Number: 35

## 2024-03-05 NOTE — Telephone Encounter (Signed)
 PAP: Application for Mariella Saa has been submitted to [employer], via fax

## 2024-03-06 ENCOUNTER — Other Ambulatory Visit: Payer: Self-pay

## 2024-03-06 ENCOUNTER — Ambulatory Visit
Admission: RE | Admit: 2024-03-06 | Discharge: 2024-03-06 | Disposition: A | Discharge status: Home / Self Care | Source: Ambulatory Visit | Attending: Radiation Oncology | Admitting: Radiation Oncology

## 2024-03-06 DIAGNOSIS — Z510 Encounter for antineoplastic radiation therapy: Secondary | ICD-10-CM | POA: Diagnosis not present

## 2024-03-06 LAB — RAD ONC ARIA SESSION SUMMARY
Course Elapsed Days: 51
Plan Fractions Treated to Date: 36
Plan Prescribed Dose Per Fraction: 2 Gy
Plan Total Fractions Prescribed: 40
Plan Total Prescribed Dose: 80 Gy
Reference Point Dosage Given to Date: 72 Gy
Reference Point Session Dosage Given: 2 Gy
Session Number: 36

## 2024-03-07 ENCOUNTER — Other Ambulatory Visit: Payer: Self-pay

## 2024-03-07 ENCOUNTER — Ambulatory Visit
Admission: RE | Admit: 2024-03-07 | Discharge: 2024-03-07 | Disposition: A | Discharge status: Home / Self Care | Source: Ambulatory Visit | Attending: Radiation Oncology | Admitting: Radiation Oncology

## 2024-03-07 DIAGNOSIS — Z510 Encounter for antineoplastic radiation therapy: Secondary | ICD-10-CM | POA: Diagnosis not present

## 2024-03-07 LAB — RAD ONC ARIA SESSION SUMMARY
Course Elapsed Days: 52
Plan Fractions Treated to Date: 37
Plan Prescribed Dose Per Fraction: 2 Gy
Plan Total Fractions Prescribed: 40
Plan Total Prescribed Dose: 80 Gy
Reference Point Dosage Given to Date: 74 Gy
Reference Point Session Dosage Given: 2 Gy
Session Number: 37

## 2024-03-08 ENCOUNTER — Other Ambulatory Visit: Payer: Self-pay | Admitting: Radiation Oncology

## 2024-03-08 ENCOUNTER — Other Ambulatory Visit: Payer: Self-pay

## 2024-03-08 ENCOUNTER — Ambulatory Visit
Admission: RE | Admit: 2024-03-08 | Discharge: 2024-03-08 | Disposition: A | Source: Ambulatory Visit | Attending: Radiation Oncology | Admitting: Radiation Oncology

## 2024-03-08 DIAGNOSIS — Z510 Encounter for antineoplastic radiation therapy: Secondary | ICD-10-CM | POA: Diagnosis not present

## 2024-03-08 LAB — RAD ONC ARIA SESSION SUMMARY
Course Elapsed Days: 53
Plan Fractions Treated to Date: 38
Plan Prescribed Dose Per Fraction: 2 Gy
Plan Total Fractions Prescribed: 40
Plan Total Prescribed Dose: 80 Gy
Reference Point Dosage Given to Date: 76 Gy
Reference Point Session Dosage Given: 2 Gy
Session Number: 38

## 2024-03-11 ENCOUNTER — Ambulatory Visit
Admission: RE | Admit: 2024-03-11 | Discharge: 2024-03-11 | Disposition: A | Source: Ambulatory Visit | Attending: Radiation Oncology | Admitting: Radiation Oncology

## 2024-03-11 ENCOUNTER — Other Ambulatory Visit

## 2024-03-11 ENCOUNTER — Other Ambulatory Visit: Payer: Self-pay

## 2024-03-11 DIAGNOSIS — I2510 Atherosclerotic heart disease of native coronary artery without angina pectoris: Secondary | ICD-10-CM

## 2024-03-11 DIAGNOSIS — R972 Elevated prostate specific antigen [PSA]: Secondary | ICD-10-CM

## 2024-03-11 DIAGNOSIS — Z0000 Encounter for general adult medical examination without abnormal findings: Secondary | ICD-10-CM

## 2024-03-11 DIAGNOSIS — Z510 Encounter for antineoplastic radiation therapy: Secondary | ICD-10-CM | POA: Diagnosis not present

## 2024-03-11 DIAGNOSIS — I129 Hypertensive chronic kidney disease with stage 1 through stage 4 chronic kidney disease, or unspecified chronic kidney disease: Secondary | ICD-10-CM

## 2024-03-11 DIAGNOSIS — M1000 Idiopathic gout, unspecified site: Secondary | ICD-10-CM

## 2024-03-11 DIAGNOSIS — E1169 Type 2 diabetes mellitus with other specified complication: Secondary | ICD-10-CM

## 2024-03-11 LAB — RAD ONC ARIA SESSION SUMMARY
Course Elapsed Days: 56
Plan Fractions Treated to Date: 39
Plan Prescribed Dose Per Fraction: 2 Gy
Plan Total Fractions Prescribed: 40
Plan Total Prescribed Dose: 80 Gy
Reference Point Dosage Given to Date: 78 Gy
Reference Point Session Dosage Given: 2 Gy
Session Number: 39

## 2024-03-11 NOTE — Telephone Encounter (Signed)
 PAP: Patient assistance application for [name]  has been approved by PAP Companies: [name] from 03/28/2024 to 03/27/2025. Medication should be delivered to PAP Delivery: Home. For further shipping updates, please contact [name] at [phone]. Patient ID is: [id number]

## 2024-03-12 ENCOUNTER — Ambulatory Visit
Admission: RE | Admit: 2024-03-12 | Discharge: 2024-03-12 | Attending: Radiation Oncology | Admitting: Radiation Oncology

## 2024-03-12 ENCOUNTER — Other Ambulatory Visit: Payer: Self-pay

## 2024-03-12 DIAGNOSIS — Z510 Encounter for antineoplastic radiation therapy: Secondary | ICD-10-CM | POA: Diagnosis not present

## 2024-03-12 LAB — LIPID PANEL
Cholesterol: 155 mg/dL (ref <200)
HDL: 44 mg/dL (ref >=40)
LDL Cholesterol (Calc): 73 mg/dL
Non-HDL Cholesterol (Calc): 111 mg/dL (ref <130)
Total CHOL/HDL Ratio: 3.5 (calc) (ref <5.0)
Triglycerides: 312 mg/dL — ABNORMAL HIGH (ref <150)

## 2024-03-12 LAB — CBC WITH DIFFERENTIAL/PLATELET
Absolute Lymphocytes: 845 {cells}/uL — ABNORMAL LOW (ref 850–3900)
Absolute Monocytes: 369 {cells}/uL (ref 200–950)
Basophils Absolute: 29 {cells}/uL (ref 0–200)
Basophils Relative: 0.7 %
Eosinophils Absolute: 168 {cells}/uL (ref 15–500)
Eosinophils Relative: 4.1 %
HCT: 38.4 % — ABNORMAL LOW (ref 39.4–51.1)
Hemoglobin: 12.6 g/dL — ABNORMAL LOW (ref 13.2–17.1)
MCH: 29.8 pg (ref 27.0–33.0)
MCHC: 32.8 g/dL (ref 31.6–35.4)
MCV: 90.8 fL (ref 81.4–101.7)
MPV: 10.3 fL (ref 7.5–12.5)
Monocytes Relative: 9 %
Neutro Abs: 2690 {cells}/uL (ref 1500–7800)
Neutrophils Relative %: 65.6 %
Platelets: 215 Thousand/uL (ref 140–400)
RBC: 4.23 Million/uL (ref 4.20–5.80)
RDW: 14.9 % (ref 11.0–15.0)
Total Lymphocyte: 20.6 %
WBC: 4.1 Thousand/uL (ref 3.8–10.8)

## 2024-03-12 LAB — COMPREHENSIVE METABOLIC PANEL WITH GFR
AG Ratio: 1.3 (calc) (ref 1.0–2.5)
ALT: 40 U/L (ref 9–46)
AST: 28 U/L (ref 10–35)
Albumin: 3.9 g/dL (ref 3.6–5.1)
Alkaline phosphatase (APISO): 74 U/L (ref 35–144)
BUN: 22 mg/dL (ref 7–25)
CO2: 31 mmol/L (ref 20–32)
Calcium: 9.7 mg/dL (ref 8.6–10.3)
Chloride: 101 mmol/L (ref 98–110)
Creat: 1.24 mg/dL (ref 0.70–1.28)
Globulin: 3.1 g/dL (ref 1.9–3.7)
Glucose, Bld: 216 mg/dL — ABNORMAL HIGH (ref 65–99)
Potassium: 4.6 mmol/L (ref 3.5–5.3)
Sodium: 139 mmol/L (ref 135–146)
Total Bilirubin: 0.4 mg/dL (ref 0.2–1.2)
Total Protein: 7 g/dL (ref 6.1–8.1)
eGFR: 62 mL/min/1.73m2 (ref >=60)

## 2024-03-12 LAB — RAD ONC ARIA SESSION SUMMARY
Course Elapsed Days: 57
Plan Fractions Treated to Date: 40
Plan Prescribed Dose Per Fraction: 2 Gy
Plan Total Fractions Prescribed: 40
Plan Total Prescribed Dose: 80 Gy
Reference Point Dosage Given to Date: 80 Gy
Reference Point Session Dosage Given: 2 Gy
Session Number: 40

## 2024-03-12 LAB — TSH: TSH: 4.28 m[IU]/L (ref 0.40–4.50)

## 2024-03-12 LAB — URIC ACID: Uric Acid, Serum: 4.6 mg/dL (ref 4.0–8.0)

## 2024-03-12 LAB — HEMOGLOBIN A1C
Hgb A1c MFr Bld: 9 % — ABNORMAL HIGH (ref <5.7)
Mean Plasma Glucose: 212 mg/dL
eAG (mmol/L): 11.7 mmol/L

## 2024-03-12 LAB — MICROALBUMIN / CREATININE URINE RATIO
Creatinine, Urine: 96 mg/dL (ref 20–320)
Microalb Creat Ratio: 29 mg/g{creat} (ref <30)
Microalb, Ur: 2.8 mg/dL

## 2024-03-12 LAB — PSA: PSA: 0.07 ng/mL (ref <=4.00)

## 2024-03-14 NOTE — Radiation Completion Notes (Signed)
 Patient Name: [name], BUDNICK MRN: [id number] Date of Birth: 23-Nov-1950 Referring Physician: MARSA OFFICER, M.D. Date of Service: 2024-03-14 Radiation Oncologist: Marcey Penton, M.D. [facility]                             RADIATION ONCOLOGY END OF TREATMENT NOTE     Diagnosis: C61 Malignant neoplasm of prostate Intent: Curative     HPI: Patient is a 73 year old male originally presented back in 90 with elevated PSA had a biopsy which was negative.  He has been followed by urology for multiple urinary tract infections.  He recently was noted to have a PSA of 10.4.  He underwent biopsy although only by report was noted to be Gleason 4 lesion.  He had an MRI of his prostate showing a 2.8 cm transitional zone lesion throughout the right base mid gland and apex highly suspicious for high-grade carcinoma.  He also an 11 mm peripheral zone lesion in the right posterior medial mid gland again suspicious for high-grade cancer.  No evidence of extracapsular extension or pelvic metastatic disease noted.  He underwent a PSMA PET scan only showing radiotracer accumulation in the right central prostate gland consistent with primary prostate cancer no evidence of metastatic disease or pelvic adenopathy involvement.  Patient has had a history of sepsis secondary prostate biopsy.  Patient is also had cardiac stent placement, type 2 diabetes, history of myocardial infarction and PE.  He is seen today for radiation oncology opinion.  He has very little symptoms has nocturia x 2 no GI problems no bone pain.      ==========DELIVERED PLANS==========  First Treatment Date: 2024-01-15 Last Treatment Date: 2024-03-12   Plan Name: Prostate Site: Prostate Technique: IMRT Mode: Photon Dose Per Fraction: 2 Gy Prescribed Dose (Delivered / Prescribed): 80 Gy / 80 Gy Prescribed Fxs (Delivered / Prescribed): 40 / 40     ==========ON TREATMENT VISIT DATES========== 2024-01-16,  2024-01-23, 2024-01-30, 2024-02-06, 2024-02-13, 2024-02-20, 2024-02-27, 2024-03-05, 2024-03-12     ==========UPCOMING VISITS========== 04/17/2024 [facility] PATIENT OUTREACH 30 [facility]AC[facility]  04/10/2024 [facility] RAD ONCOLOGY FOLLOW UP 30 Penton Marcey, MD  03/19/2024 [facility] PHYSICAL Officer Marsa PARAS, DO  03/15/2024 [facility] MEDICARE AWV, SEQUENTIAL [facility] WE[facility] VISIT        ==========APPENDIX - ON TREATMENT VISIT NOTES==========   See weekly On Treatment Notes in Epic for details in the Media tab (listed as Progress notes on the On Treatment Visit Dates listed above).

## 2024-03-15 ENCOUNTER — Ambulatory Visit: Payer: Self-pay

## 2024-03-15 VITALS — BP 138/80 | Ht 71.0 in | Wt 234.4 lb

## 2024-03-15 DIAGNOSIS — Z0000 Encounter for general adult medical examination without abnormal findings: Secondary | ICD-10-CM | POA: Diagnosis not present

## 2024-03-15 NOTE — Progress Notes (Signed)
 "  Chief Complaint  Patient presents with   Medicare Wellness     Subjective:   [name] is a 73 y.o. male who presents for a Medicare Annual Wellness Visit.  Visit info / Clinical Intake: Medicare Wellness Visit Type:: Subsequent Annual Wellness Visit Persons participating in visit and providing information:: patient Medicare Wellness Visit Mode:: In-person (required for WTM) Interpreter Needed?: No Pre-visit prep was completed: yes AWV questionnaire completed by patient prior to visit?: no Living arrangements:: lives with spouse/significant other Patient's Overall Health Status Rating: very good Typical amount of pain: some Does pain affect daily life?: no Are you currently prescribed opioids?: no  Dietary Habits and Nutritional Risks How many meals a day?: 3 Eats fruit and vegetables daily?: (!) no Most meals are obtained by: preparing own meals In the last 2 weeks, have you had any of the following?: none Diabetic:: (!) yes Any non-healing wounds?: no How often do you check your BS?: 1 (once per day) Would you like to be referred to a Nutritionist or for Diabetic Management? : no  Functional Status Activities of Daily Living (to include ambulation/medication): Independent Ambulation: Independent Medication Administration: Independent Home Management (perform basic housework or laundry): Independent Manage your own finances?: yes Primary transportation is: driving Concerns about vision?: no *vision screening is required for WTM* (readers- [facility]-seen once per year) Concerns about hearing?: no  Fall Screening Falls in the past year?: 0 Number of falls in past year: 0 Was there an injury with Fall?: 0 Fall Risk Category Calculator: 0 Patient Fall Risk Level: Low Fall Risk  Fall Risk Patient at Risk for Falls Due to: No Fall Risks Fall risk Follow up: Falls evaluation completed; Falls prevention discussed  Home and Transportation Safety: All rugs  have non-skid backing?: yes All stairs or steps have railings?: (!) no Grab bars in the bathtub or shower?: (!) no Have non-skid surface in bathtub or shower?: (!) no Good home lighting?: yes Regular seat belt use?: yes Hospital stays in the last year:: no  Cognitive Assessment Difficulty concentrating, remembering, or making decisions? : no Will 6CIT or Mini Cog be Completed: yes What year is it?: 0 points What month is it?: 0 points Give patient an address phrase to remember (5 components): [address] [place], [place] About what time is it?: 0 points Count backwards from 20 to 1: 0 points Say the months of the year in reverse: 2 points Repeat the address phrase from earlier: 2 points 6 CIT Score: 4 points  Advance Directives (For Healthcare) Does Patient Have a Medical Advance Directive?: No Would patient like information on creating a medical advance directive?: No - Patient declined  Reviewed/Updated  Reviewed/Updated: Reviewed All (Medical, Surgical, Family, Medications, Allergies, Care Teams, Patient Goals)    Allergies (verified) Crestor  [rosuvastatin  calcium ], Ciprofloxacin , Invokana  [canagliflozin ], Prednisone, and Sulfamethizole   Current Medications (verified) Outpatient Encounter Medications as of 03/15/2024  Medication Sig   ACCU-CHEK SMARTVIEW test strip TEST BLOOD SUGAR TWO TIMES DAILY   acetaminophen  (TYLENOL ) 500 MG tablet Take 1,000 mg by mouth every 8 (eight) hours as needed.    allopurinol  (ZYLOPRIM ) 100 MG tablet TAKE 1 TABLET AT BEDTIME   carvedilol  (COREG ) 12.5 MG tablet Take 1 tablet (12.5 mg total) by mouth 2 (two) times daily.   clopidogrel  (PLAVIX ) 75 MG tablet Take 1 tablet (75 mg total) by mouth daily.   colchicine  0.6 MG tablet Take for gout flare start Day 1 with 2 tablets then may  take 1 extra tablet in 1 hour. Starting Day 2 take 1 tablet daily for up to 7-10 days until resolve   DROPLET PEN NEEDLES 32G X 4 MM MISC USE WITH [name]  INSULIN   INJECTION DAILY AS INSTRUCTED   fluticasone  (FLONASE ) 50 MCG/ACT nasal spray USE 2 SPRAYS IN EACH NOSTRIL ONCE DAILY - USE FOR 4 TO 6 WEEKS, THEN STOP AND USE SEASONALLY OR AS NEEDED.   gabapentin  (NEURONTIN ) 300 MG capsule Take 1 capsule (300 mg total) by mouth at bedtime.   Insulin  Glargine ([name]  KWIKPEN) 100 UNIT/ML DIAL AND INJECT 37 UNITS UNDER THE SKIN DAILY AT BEDTIME. MAX DAILY DOSE IS 37 UNITS.   isosorbide  mononitrate (IMDUR ) 30 MG 24 hr tablet Take 0.5 tablets (15 mg total) by mouth daily.   lisinopril  (ZESTRIL ) 40 MG tablet Take 1 tablet (40 mg total) by mouth daily.   nitroGLYCERIN  (NITROSTAT ) 0.4 MG SL tablet Place 1 tablet (0.4 mg total) under the tongue every 5 (five) minutes as needed for chest pain.   pantoprazole  (PROTONIX ) 20 MG tablet TAKE 1 TABLET TWICE DAILY BEFORE MEALS   REPATHA  SURECLICK 140 MG/ML SOAJ INJECT 140 MG INTO THE SKIN EVERY 14 DAYS   Semaglutide ,0.25 or 0.5MG /DOS, (OZEMPIC , 0.25 OR 0.5 MG/DOSE,) 2 MG/3ML SOPN Inject 0.5 mg into the skin once a week.   tamsulosin  (FLOMAX ) 0.4 MG CAPS capsule TAKE 1 CAPSULE BY MOUTH ONCE DAILY AFTER SUPPER   No facility-administered encounter medications on file as of 03/15/2024.    History: Past Medical History:  Diagnosis Date   Allergy    CAD (coronary artery disease)    a. 01/2019 MV: EF 55-65%, no ischemia; b. 12/2019 NSTEMI/PCI: LM nl, LAD 40p, 75m, D1 40, LCX nl, OM3 40, RCA 90/85p (2.75x34 Resolute Onyx DES), 33m/d.   Cataract 2023   removed in 2023   Chronic headaches    CKD (chronic kidney disease), stage III (HCC)    Edentulous    no lower teeth   GERD (gastroesophageal reflux disease)    Gout    Heart burn    History of echocardiogram    a. 12/2019 Echo: EF 60-65%, no rwma, triv MR.   Hyperlipidemia    Hypertension    Motion sickness    boats   NSTEMI (non-ST elevated myocardial infarction) (HCC) 12/27/2019   Pulmonary embolism (HCC) 05/2017   Type 2 diabetes mellitus [facility])    Past Surgical  History:  Procedure Laterality Date   BACK SURGERY  02/26/2019   CATARACT EXTRACTION W/PHACO Left 04/14/2021   Procedure: CATARACT EXTRACTION PHACO AND INTRAOCULAR LENS PLACEMENT (IOC) LEFT DIABETIC maylugin;  Surgeon: Mittie Gaskin, MD;  Location: [facility];  Service: Ophthalmology;  Laterality: Left;  11.22 1:25.1   CATARACT EXTRACTION W/PHACO Right 06/09/2021   Procedure: CATARACT EXTRACTION PHACO AND INTRAOCULAR LENS PLACEMENT (IOC) RIGHT DIABETIC MALYUGIN;  Surgeon: Mittie Gaskin, MD;  Location: [facility];  Service: Ophthalmology;  Laterality: Right;  Diabetic 4.75 01:05.7   CORONARY STENT INTERVENTION N/A 10/26/2021   Procedure: CORONARY STENT INTERVENTION;  Surgeon: Mady Bruckner, MD;  Location: [facility] INVAS[facility] CV LAB;  Service: Cardiovascular;  Laterality: N/A;   CORONARY ULTRASOUND/IVUS N/A 10/26/2021   Procedure: Intravascular Ultrasound/IVUS;  Surgeon: Mady Bruckner, MD;  Location: [facility] CV [facility];  Service: Cardiovascular;  Laterality: N/A;   EYE SURGERY  see above comment   KNEE SURGERY     LEFT HEART CATH AND CORONARY ANGIOGRAPHY N/A 12/30/2019   Procedure: LEFT HEART CATH AND CORONARY ANGIOGRAPHY;  Surgeon:  Darron Deatrice LABOR, MD;  Location: [facility];  Service: Cardiovascular;  Laterality: N/A;   LEFT HEART CATH AND CORONARY ANGIOGRAPHY N/A 10/26/2021   Procedure: LEFT HEART CATH AND CORONARY ANGIOGRAPHY;  Surgeon: Mady Bruckner, MD;  Location: [facility] INVAS[facility] CV LAB;  Service: Cardiovascular;  Laterality: N/A;   SHOULDER SURGERY     SPINE SURGERY  02/26/2019   TONSILLECTOMY     Family History  Problem Relation Age of Onset   Cancer Father        lung   Heart disease Sister    Heart disease Brother    Cancer Brother    Heart disease Brother    Prostate cancer Neg Hx    Social History   Occupational History   Occupation: retired  Tobacco Use   Smoking status: Former    Current packs/day: 0.00    Types:  Cigars, Cigarettes    Quit date: 03/28/1998    Years since quitting: 25.9   Smokeless tobacco: Current    Types: Chew   Tobacco comments:    wife states patient never smoked cigarettes, just chewing tobacco  Vaping Use   Vaping status: Never Used  Substance and Sexual Activity   Alcohol use: Not Currently   Drug use: No   Sexual activity: Yes    Birth control/protection: None   Tobacco Counseling Ready to quit: Not Answered Counseling given: No Tobacco comments: wife states patient never smoked cigarettes, just chewing tobacco  SDOH Screenings   Food Insecurity: No Food Insecurity (03/09/2024)  Housing: Low Risk (03/09/2024)  Transportation Needs: No Transportation Needs (03/09/2024)  Utilities: Not At Risk (03/10/2023)  Alcohol Screen: Low Risk (03/10/2023)  Depression (PHQ2-9): Low Risk (12/28/2023)  Financial Resource Strain: Low Risk (03/09/2024)  Physical Activity: Sufficiently Active (03/09/2024)  Social Connections: Moderately Integrated (03/09/2024)  Stress: No Stress Concern Present (03/09/2024)  Tobacco Use: High Risk (03/15/2024)  Health Literacy: Adequate Health Literacy (03/10/2023)   See flowsheets for full screening details  Depression Screen PHQ 2 & 9 Depression Scale- Over the past 2 weeks, how often have you been bothered by any of the following problems? Little interest or pleasure in doing things: 0 Feeling down, depressed, or hopeless (PHQ Adolescent also includes...irritable): 0 PHQ-2 Total Score: 0     Goals Addressed             This Visit's Progress    Cut out extra servings               Objective:    Today's Vitals   03/15/24 0838  BP: 138/80  Weight: 234 lb 6.4 oz (106.3 kg)  Height: 5' 11 (1.803 m)   Body mass index is 32.69 kg/m.  Hearing/Vision screen No results found. Immunizations and Health Maintenance Health Maintenance  Topic Date Due   Pneumococcal Vaccine: 50+ Years (1 of 2 - PCV) Never done   Zoster  Vaccines- Shingrix  (1 of 2) Never done   COVID-19 Vaccine (3 - Pfizer risk series) 07/03/2019   Influenza Vaccine  Never done   FOOT EXAM  03/09/2024   Fecal DNA (Cologuard)  08/28/2024   HEMOGLOBIN A1C  09/09/2024   OPHTHALMOLOGY EXAM  01/01/2025   Diabetic kidney evaluation - eGFR measurement  03/11/2025   Diabetic kidney evaluation - Urine ACR  03/11/2025   Medicare Annual Wellness (AWV)  03/15/2025   DTaP/Tdap/Td (2 - Td or Tdap) 07/26/2026   Hepatitis C Screening  Completed   Meningococcal B Vaccine  Aged Out  Assessment/Plan:  This is a routine wellness examination for Jakson.  Patient Care Team: Edman Marsa PARAS, DO as PCP - General (Family Medicine) Darliss Rogue, MD as PCP - Cardiology (Cardiology) Alana, Sharyle LABOR, RPH-CPP as Pharmacist Pa, [facility])  I have personally reviewed and noted the following in the patients chart:   Medical and social history Use of alcohol, tobacco or illicit drugs  Current medications and supplements including opioid prescriptions. Functional ability and status Nutritional status Physical activity Advanced directives List of other physicians Hospitalizations, surgeries, and ER visits in previous 12 months Vitals Screenings to include cognitive, depression, and falls Referrals and appointments  No orders of the defined types were placed in this encounter.  In addition, I have reviewed and discussed with patient certain preventive protocols, quality metrics, and best practice recommendations. A written personalized care plan for preventive services as well as general preventive health recommendations were provided to patient.   Jhonnie GORMAN Das, LPN   [id number]   Return in 1 year (on 03/15/2025).  After Visit Summary: (In Person-Declined) Patient declined AVS at this time.  Nurse Notes: DECLINES FLU & PNA SHOTS (MAKES HIM SICK); NEEDS SHINGRIX ; UTD ON COLOGUARD  "

## 2024-03-15 NOTE — Patient Instructions (Addendum)
 [name],  Thank you for taking the time for your Medicare Wellness Visit. I appreciate your continued commitment to your health goals. Please review the care plan we discussed, and feel free to reach out if I can assist you further.  Please note that Annual Wellness Visits do not include a physical exam. Some assessments may be limited, especially if the visit was conducted virtually. If needed, we may recommend an in-person follow-up with your provider.  Ongoing Care Seeing your primary care provider every 3 to 6 months helps us  monitor your health and provide consistent, personalized care.   Referrals If a referral was made during today's visit and you haven't received any updates within two weeks, please contact the referred provider directly to check on the status.  Recommended Screenings:  Health Maintenance  Topic Date Due   Pneumococcal Vaccine for age over 30 (1 of 2 - PCV) Never done   Zoster (Shingles) Vaccine (1 of 2) Never done   COVID-19 Vaccine (3 - Pfizer risk series) 07/03/2019   Flu Shot  Never done   Complete foot exam   03/09/2024   Cologuard (Stool DNA test)  08/28/2024   Hemoglobin A1C  09/09/2024   Eye exam for diabetics  01/01/2025   Yearly kidney function blood test for diabetes  03/11/2025   Yearly kidney health urinalysis for diabetes  03/11/2025   Medicare Annual Wellness Visit  03/15/2025   DTaP/Tdap/Td vaccine (2 - Td or Tdap) 07/26/2026   Hepatitis C Screening  Completed   Meningitis B Vaccine  Aged Out     Vision: Annual vision screenings are recommended for early detection of glaucoma, cataracts, and diabetic retinopathy. These exams can also reveal signs of chronic conditions such as diabetes and high blood pressure.  Dental: Annual dental screenings help detect early signs of oral cancer, gum disease, and other conditions linked to overall health, including heart disease and diabetes.  Please see the attached documents for additional preventive  care recommendations.   NEXT AWV 03/19/25 @ 8:10 AM IN PERSON

## 2024-03-19 ENCOUNTER — Ambulatory Visit: Admitting: Family Medicine

## 2024-03-19 ENCOUNTER — Encounter: Payer: Self-pay | Admitting: Family Medicine

## 2024-03-19 VITALS — BP 132/80 | HR 76 | Ht 71.0 in | Wt 233.2 lb

## 2024-03-19 DIAGNOSIS — Z9861 Coronary angioplasty status: Secondary | ICD-10-CM | POA: Diagnosis not present

## 2024-03-19 DIAGNOSIS — E785 Hyperlipidemia, unspecified: Secondary | ICD-10-CM | POA: Diagnosis not present

## 2024-03-19 DIAGNOSIS — E1169 Type 2 diabetes mellitus with other specified complication: Secondary | ICD-10-CM | POA: Diagnosis not present

## 2024-03-19 DIAGNOSIS — K219 Gastro-esophageal reflux disease without esophagitis: Secondary | ICD-10-CM

## 2024-03-19 DIAGNOSIS — I2510 Atherosclerotic heart disease of native coronary artery without angina pectoris: Secondary | ICD-10-CM

## 2024-03-19 DIAGNOSIS — I129 Hypertensive chronic kidney disease with stage 1 through stage 4 chronic kidney disease, or unspecified chronic kidney disease: Secondary | ICD-10-CM

## 2024-03-19 DIAGNOSIS — E1142 Type 2 diabetes mellitus with diabetic polyneuropathy: Secondary | ICD-10-CM | POA: Diagnosis not present

## 2024-03-19 DIAGNOSIS — G720 Drug-induced myopathy: Secondary | ICD-10-CM

## 2024-03-19 DIAGNOSIS — Z0000 Encounter for general adult medical examination without abnormal findings: Secondary | ICD-10-CM | POA: Diagnosis not present

## 2024-03-19 DIAGNOSIS — Z794 Long term (current) use of insulin: Secondary | ICD-10-CM

## 2024-03-19 DIAGNOSIS — N182 Chronic kidney disease, stage 2 (mild): Secondary | ICD-10-CM

## 2024-03-19 DIAGNOSIS — C61 Malignant neoplasm of prostate: Secondary | ICD-10-CM | POA: Diagnosis not present

## 2024-03-19 DIAGNOSIS — M1000 Idiopathic gout, unspecified site: Secondary | ICD-10-CM | POA: Diagnosis not present

## 2024-03-19 MED ORDER — REPATHA SURECLICK 140 MG/ML ~~LOC~~ SOAJ: 140.0000 mg | SUBCUTANEOUS | 5 refills | Status: AC

## 2024-03-19 NOTE — Progress Notes (Signed)
 "  Subjective:    Patient ID: [name], male    DOB: February 25, 1951, 73 y.o.   MRN: [id number]  [name] is a 73 y.o. male presenting on 03/19/2024 for Annual Exam   HPI  Discussed the use of AI scribe software for clinical note transcription with the patient, who gave verbal consent to proceed.  History of Present Illness   [name] is a 73 year old male who presents for an annual physical exam. He is accompanied by his daughter, Almarie.  Prostate cancer and post-treatment effects [facility] [place] / Dr Lenn Rad Onc [facility] Steffan Pea MD [facility] Last apt yesterday 03/18/24, repeat PSA in 3 months, bladder scan looks good on ultrasound - Completed radiation treatment for prostate cancer - Experiences weakness and neuropathy in legs, attributed to radiation treatment - Scheduled follow-up with radiation oncologist on January 14th - Most recent PSA reading 0.07 - Next PSA check scheduled in three months  Peripheral neuropathy and lower extremity symptoms - Ongoing neuropathy in legs and feet - Toes feel puffy and sore - Neuropathy symptoms may be improving but persistent sensations in toes - Weakness in legs associated with prior radiation treatment  Hearing loss, chronic - Experiencing hearing difficulties - Previously used hearing aids without benefit - Considering trial of less expensive hearing aids, uncertain of effectiveness  Urinary symptoms post-prostate cancer treatment - Currently taking medication for urinary issues following prostate cancer treatment - Received sample medication (possibly Myrbetriq or Gemtesa) - Nocturia reduced to once per night with current medication  CHRONIC DM, Type 2 with nephropathy / CKD-II A1c up to 9.0 Likely factor with Radiation therapy Goal to improve diet - Feels increased hunger and overeating since starting Ozempic , attributed to both medication and prior radiation treatment Meds: -  On Ozempic  0.5mg  weekly - On [name]  37 units nightly  - Remains off Metformin  Reports  good compliance. Tolerating well w/o side-effects Currently on ACEi - Exercise limited Diabetic Eye Exam 12/2023 Prior previous cataract, has plan for upcoming laser therapy Denies hypoglycemia, polyuria, visual changes, numbness or tingling.  HYPERLIPIDEMIA Drug Induced Myopathy Off Statin therapy failed due to myopathy - On Repatha  for cholesterol management - LDL at 73 - History of elevated triglycerides   CAD History of MI   Hypertension Controlled on current medication.       03/15/2024    8:55 AM 12/28/2023   10:49 AM 03/10/2023    9:02 AM  Depression screen PHQ 2/9  Decreased Interest 0 0 0  Down, Depressed, Hopeless 0 0 1  PHQ - 2 Score 0 0 1  Altered sleeping 0  0  Tired, decreased energy 0  1  Change in appetite 0  0  Feeling bad or failure about yourself  0  0  Trouble concentrating 0  0  Moving slowly or fidgety/restless 0  0  Suicidal thoughts 0  0  PHQ-9 Score 0  2   Difficult doing work/chores Not difficult at all  Not difficult at all     Data saved with a previous flowsheet row definition       09/01/2022   10:26 AM 03/02/2022   10:34 AM 09/16/2021   10:28 AM 11/10/2020    2:14 PM  GAD 7 : Generalized Anxiety Score  Nervous, Anxious, on Edge 0 0 0 0  Control/stop worrying 0 0 0 0  Worry too much - different things 0 0 0 0  Trouble relaxing 0 0 0  0  Restless 0 0 0 0  Easily annoyed or irritable 0 0 0 0  Afraid - awful might happen 0 0 0 0  Total GAD 7 Score 0 0 0 0  Anxiety Difficulty Not difficult at all Not difficult at all Not difficult at all Not difficult at all     Past Medical History:  Diagnosis Date   Allergy    CAD (coronary artery disease)    a. 01/2019 MV: EF 55-65%, no ischemia; b. 12/2019 NSTEMI/PCI: LM nl, LAD 40p, 1m, D1 40, LCX nl, OM3 40, RCA 90/85p (2.75x34 Resolute Onyx DES), 72m/d.   Cataract 2023   removed in 2023   Chronic  headaches    CKD (chronic kidney disease), stage III (HCC)    Edentulous    no lower teeth   GERD (gastroesophageal reflux disease)    Gout    Heart burn    History of echocardiogram    a. 12/2019 Echo: EF 60-65%, no rwma, triv MR.   Hyperlipidemia    Hypertension    Motion sickness    boats   NSTEMI (non-ST elevated myocardial infarction) (HCC) 12/27/2019   Pulmonary embolism (HCC) 05/2017   Type 2 diabetes mellitus [facility])    Past Surgical History:  Procedure Laterality Date   BACK SURGERY  02/26/2019   CATARACT EXTRACTION W/PHACO Left 04/14/2021   Procedure: CATARACT EXTRACTION PHACO AND INTRAOCULAR LENS PLACEMENT (IOC) LEFT DIABETIC maylugin;  Surgeon: Mittie Gaskin, MD;  Location: [facility];  Service: Ophthalmology;  Laterality: Left;  11.22 1:25.1   CATARACT EXTRACTION W/PHACO Right 06/09/2021   Procedure: CATARACT EXTRACTION PHACO AND INTRAOCULAR LENS PLACEMENT (IOC) RIGHT DIABETIC MALYUGIN;  Surgeon: Mittie Gaskin, MD;  Location: [facility];  Service: Ophthalmology;  Laterality: Right;  Diabetic 4.75 01:05.7   CORONARY STENT INTERVENTION N/A 10/26/2021   Procedure: CORONARY STENT INTERVENTION;  Surgeon: Mady Bruckner, MD;  Location: [facility] INVAS[facility];  Service: Cardiovascular;  Laterality: N/A;   CORONARY ULTRASOUND/IVUS N/A 10/26/2021   Procedure: Intravascular Ultrasound/IVUS;  Surgeon: Mady Bruckner, MD;  Location: [facility] INVAS[facility] CV [facility];  Service: Cardiovascular;  Laterality: N/A;   EYE SURGERY  see above comment   KNEE SURGERY     LEFT HEART CATH AND CORONARY ANGIOGRAPHY N/A 12/30/2019   Procedure: LEFT HEART CATH AND CORONARY ANGIOGRAPHY;  Surgeon: Darron Deatrice LABOR, MD;  Location: [facility];  Service: Cardiovascular;  Laterality: N/A;   LEFT HEART CATH AND CORONARY ANGIOGRAPHY N/A 10/26/2021   Procedure: LEFT HEART CATH AND CORONARY ANGIOGRAPHY;  Surgeon: Mady Bruckner, MD;  Location: [facility] INVAS[facility] CV [facility];   Service: Cardiovascular;  Laterality: N/A;   SHOULDER SURGERY     SPINE SURGERY  02/26/2019   TONSILLECTOMY     Social History   Socioeconomic History   Marital status: Married    Spouse name: Not on file   Number of children: Not on file   Years of education: Not on file   Highest education level: 8th grade  Occupational History   Occupation: retired  Tobacco Use   Smoking status: Former    Current packs/day: 0.00    Types: Cigars, Cigarettes    Quit date: 03/28/1998    Years since quitting: 25.9   Smokeless tobacco: Current    Types: Chew   Tobacco comments:    wife states patient never smoked cigarettes, just chewing tobacco  Vaping Use   Vaping status: Never Used  Substance and Sexual Activity   Alcohol use: Not Currently  Drug use: No   Sexual activity: Yes    Birth control/protection: None  Other Topics Concern   Not on file  Social History Narrative   Not on file   Social Drivers of Health   Tobacco Use: High Risk (03/19/2024)   Patient History    Smoking Tobacco Use: Former    Smokeless Tobacco Use: Current    Passive Exposure: Not on file  Financial Resource Strain: Low Risk (03/09/2024)   Overall Financial Resource Strain (CARDIA)    Difficulty of Paying Living Expenses: Not hard at all  Food Insecurity: No Food Insecurity (03/15/2024)   Epic    Worried About Programme Researcher, Broadcasting/film/video in the Last Year: Never true    Ran Out of Food in the Last Year: Never true  Transportation Needs: No Transportation Needs (03/15/2024)   Epic    Lack of Transportation (Medical): No    Lack of Transportation (Non-Medical): No  Physical Activity: Sufficiently Active (03/15/2024)   Exercise Vital Sign    Days of Exercise per Week: 5 days    Minutes of Exercise per Session: 150+ min  Stress: No Stress Concern Present (03/15/2024)   [employer] of Occupational Health - Occupational Stress Questionnaire    Feeling of Stress: Only a little  Social Connections:  Moderately Isolated (03/15/2024)   Social Connection and Isolation Panel    Frequency of Communication with Friends and Family: More than three times a week    Frequency of Social Gatherings with Friends and Family: More than three times a week    Attends Religious Services: Never    Database Administrator or Organizations: No    Attends Banker Meetings: Never    Marital Status: Married  Catering Manager Violence: Not At Risk (03/15/2024)   Epic    Fear of Current or Ex-Partner: No    Emotionally Abused: No    Physically Abused: No    Sexually Abused: No  Depression (PHQ2-9): Low Risk (03/15/2024)   Depression (PHQ2-9)    PHQ-2 Score: 0  Alcohol Screen: Low Risk (03/10/2023)   Alcohol Screen    Last Alcohol Screening Score (AUDIT): 0  Housing: Unknown (03/15/2024)   Epic    Unable to Pay for Housing in the Last Year: No    Number of Times Moved in the Last Year: Not on file    Homeless in the Last Year: No  Utilities: Not At Risk (03/15/2024)   Epic    Threatened with loss of utilities: No  Health Literacy: Adequate Health Literacy (03/15/2024)   B1300 Health Literacy    Frequency of need for help with medical instructions: Never   Family History  Problem Relation Age of Onset   Cancer Father        lung   Heart disease Sister    Heart disease Brother    Cancer Brother    Heart disease Brother    Prostate cancer Neg Hx    Current Outpatient Medications on File Prior to Visit  Medication Sig   ACCU-CHEK SMARTVIEW test strip TEST BLOOD SUGAR TWO TIMES DAILY   acetaminophen  (TYLENOL ) 500 MG tablet Take 1,000 mg by mouth every 8 (eight) hours as needed.    allopurinol  (ZYLOPRIM ) 100 MG tablet TAKE 1 TABLET AT BEDTIME   carvedilol  (COREG ) 12.5 MG tablet Take 1 tablet (12.5 mg total) by mouth 2 (two) times daily.   clopidogrel  (PLAVIX ) 75 MG tablet Take 1 tablet (75 mg total) by mouth daily.  colchicine  0.6 MG tablet Take for gout flare start Day 1 with 2  tablets then may take 1 extra tablet in 1 hour. Starting Day 2 take 1 tablet daily for up to 7-10 days until resolve   DROPLET PEN NEEDLES 32G X 4 MM MISC USE WITH [name]  INSULIN  INJECTION DAILY AS INSTRUCTED   fluticasone  (FLONASE ) 50 MCG/ACT nasal spray USE 2 SPRAYS IN EACH NOSTRIL ONCE DAILY - USE FOR 4 TO 6 WEEKS, THEN STOP AND USE SEASONALLY OR AS NEEDED.   gabapentin  (NEURONTIN ) 300 MG capsule Take 1 capsule (300 mg total) by mouth at bedtime.   Insulin  Glargine ([name]  KWIKPEN) 100 UNIT/ML DIAL AND INJECT 37 UNITS UNDER THE SKIN DAILY AT BEDTIME. MAX DAILY DOSE IS 37 UNITS.   isosorbide  mononitrate (IMDUR ) 30 MG 24 hr tablet Take 0.5 tablets (15 mg total) by mouth daily.   lisinopril  (ZESTRIL ) 40 MG tablet Take 1 tablet (40 mg total) by mouth daily.   nitroGLYCERIN  (NITROSTAT ) 0.4 MG SL tablet Place 1 tablet (0.4 mg total) under the tongue every 5 (five) minutes as needed for chest pain.   pantoprazole  (PROTONIX ) 20 MG tablet TAKE 1 TABLET TWICE DAILY BEFORE MEALS   Semaglutide ,0.25 or 0.5MG /DOS, (OZEMPIC , 0.25 OR 0.5 MG/DOSE,) 2 MG/3ML SOPN Inject 0.5 mg into the skin once a week.   tamsulosin  (FLOMAX ) 0.4 MG CAPS capsule TAKE 1 CAPSULE BY MOUTH ONCE DAILY AFTER SUPPER   No current facility-administered medications on file prior to visit.    Review of Systems  Constitutional:  Negative for activity change, appetite change, chills, diaphoresis, fatigue and fever.  HENT:  Positive for hearing loss. Negative for congestion.   Eyes:  Negative for visual disturbance.  Respiratory:  Negative for cough, chest tightness, shortness of breath and wheezing.   Cardiovascular:  Negative for chest pain, palpitations and leg swelling.  Gastrointestinal:  Negative for abdominal pain, constipation, diarrhea, nausea and vomiting.  Genitourinary:  Negative for dysuria, frequency and hematuria.  Musculoskeletal:  Negative for arthralgias and neck pain.  Skin:  Negative for rash.  Neurological:   Negative for dizziness, weakness, light-headedness, numbness and headaches.  Hematological:  Negative for adenopathy.  Psychiatric/Behavioral:  Negative for behavioral problems, dysphoric mood and sleep disturbance.    Per HPI unless specifically indicated above     Objective:    BP 132/80 (BP Location: Right Arm, Patient Position: Sitting, Cuff Size: Large)   Pulse 76   Ht 5' 11 (1.803 m)   Wt 233 lb 4 oz (105.8 kg)   SpO2 96%   BMI 32.53 kg/m   Wt Readings from Last 3 Encounters:  03/19/24 233 lb 4 oz (105.8 kg)  03/15/24 234 lb 6.4 oz (106.3 kg)  01/10/24 230 lb 3.2 oz (104.4 kg)    Physical Exam Vitals and nursing note reviewed.  Constitutional:      General: He is not in acute distress.    Appearance: He is well-developed. He is not diaphoretic.     Comments: Well-appearing, comfortable, cooperative  HENT:     Head: Normocephalic and atraumatic.  Eyes:     General:        Right eye: No discharge.        Left eye: No discharge.     Conjunctiva/sclera: Conjunctivae normal.     Pupils: Pupils are equal, round, and reactive to light.  Neck:     Thyroid: No thyromegaly.  Cardiovascular:     Rate and Rhythm: Normal rate and regular rhythm.  Pulses: Normal pulses.     Heart sounds: Normal heart sounds. No murmur heard. Pulmonary:     Effort: Pulmonary effort is normal. No respiratory distress.     Breath sounds: Normal breath sounds. No wheezing or rales.  Abdominal:     General: Bowel sounds are normal. There is no distension.     Palpations: Abdomen is soft. There is no mass.     Tenderness: There is no abdominal tenderness.  Musculoskeletal:        General: No tenderness. Normal range of motion.     Cervical back: Normal range of motion and neck supple.     Right lower leg: No edema.     Left lower leg: No edema.     Comments: Upper / Lower Extremities: - Normal muscle tone, strength bilateral upper extremities 5/5, lower extremities 5/5  Lymphadenopathy:      Cervical: No cervical adenopathy.  Skin:    General: Skin is warm and dry.     Findings: No erythema or rash.  Neurological:     Mental Status: He is alert and oriented to person, place, and time.     Comments: Distal sensation intact to light touch all extremities  Psychiatric:        Mood and Affect: Mood normal.        Behavior: Behavior normal.        Thought Content: Thought content normal.     Comments: Well groomed, good eye contact, normal speech and thoughts       Diabetic Foot Exam - Simple   Simple Foot Form Diabetic Foot exam was performed with the following findings: Yes 03/19/2024 10:04 AM  Visual Inspection See comments: Yes Sensation Testing See comments: Yes Pulse Check Posterior Tibialis and Dorsalis pulse intact bilaterally: Yes Comments Bilateral feet with some callus formation forefoot great toe and heel. Mild reduced monofilament at heel due to callus. Otherwise intact monofilament. No ulceration.      Results for orders placed or performed in visit on 03/12/24  Rad Onc [facility] Session Summary   Collection Time: 03/12/24  3:26 PM  Result Value Ref Range   Course ID [id number]    Course Intent Curative    Course Start Date 01/03/2024    Session Number 40    Course First Treatment Date 01/15/2024  3:28 PM    Course Last Treatment Date 03/12/2024  3:23 PM    Course Elapsed Days 57    Reference Point ID Prostate DP    Reference Point Dosage Given to Date 80 Gy   Reference Point Session Dosage Given 2 Gy   Plan ID Prostate    Plan Fractions Treated to Date 40    Plan Total Fractions Prescribed 40    Plan Prescribed Dose Per Fraction 2 Gy   Plan Total Prescribed Dose 80.000000 Gy   Plan Primary Reference Point Prostate DP       Assessment & Plan:   Problem List Items Addressed This Visit     CAD S/P percutaneous coronary angioplasty   Relevant Medications   REPATHA  SURECLICK 140 MG/ML SOAJ   Drug-induced myopathy   GERD without  esophagitis   Gout   Type 2 diabetes mellitus with other specified complication (HCC)   Other Visit Diagnoses       Annual physical exam    -  Primary     Long term current use of insulin  (HCC)         Coronary artery disease involving  native coronary artery of native heart, unspecified whether angina present       Relevant Medications   REPATHA  SURECLICK 140 MG/ML SOAJ     Hyperlipidemia associated with type 2 diabetes mellitus (HCC)       Relevant Medications   REPATHA  SURECLICK 140 MG/ML SOAJ     Benign hypertension with CKD (chronic kidney disease), stage II       Relevant Medications   REPATHA  SURECLICK 140 MG/ML SOAJ     Diabetic peripheral neuropathy (HCC)         Prostate cancer [facility])            Updated Health Maintenance information Reviewed recent lab results with patient Encouraged improvement to lifestyle with diet and exercise Goal of weight loss   Type 2 diabetes mellitus complicated by diabetic peripheral neuropathy and hyperlipidemia Type 2 diabetes with elevated A1c at 9.0, likely due to radiation therapy and dietary changes. Diabetic peripheral neuropathy symptoms present. - Continue Repatha  for hyperlipidemia. - Provided sample of Ozempic  for blood glucose management. - Monitor blood glucose levels and adjust treatment as needed. - Scheduled follow-up in March for diabetes reassessment.  Hypertensive chronic kidney disease stage 2 Improved Creatinine / eGFR >60 Kidney function well-managed with recent urine tests indicating good function. - Continue current management for hypertension and kidney disease.  Hyperlipidemia Drug Induced Myopathy CAD On Repatha   Prostate cancer, post-radiation therapy Post-radiation therapy with PSA at 0.07, indicating good response. Leg weakness and neuropathy possibly radiation-related. Improvement in urinary symptoms with prescribed medication. - Continue follow-up with radiation oncologist and urologist. - Monitor  PSA levels as per urologist's recommendation. - Continue prescribed medication for urinary symptoms.  General Health Maintenance Declined flu, pneumonia, and shingles vaccines. Discussed hearing issues and potential ENT referral for hearing aids. - Consider ENT Audiology referral for hearing evaluation if interested. - Continue routine health maintenance and screenings.         No orders of the defined types were placed in this encounter.   Meds ordered this encounter  Medications   REPATHA  SURECLICK 140 MG/ML SOAJ    Sig: Inject 140 mg as directed every 14 (fourteen) days.    Dispense:  2 mL    Refill:  5     Follow up plan: Return in about 3 months (around 06/17/2024) for 3 month DM A1c, med updates.  Marsa Officer, DO [facility] 03/19/2024, 9:45 AM  "

## 2024-03-19 NOTE — Patient Instructions (Addendum)
 Thank you for coming to the office today.  Keep up with Urologist and Radiation Oncologist  Check name of sample for urinary medication - Myrbetriq or Gemtesa  Future if interested referral to Audiology department for hearing aid eval  [facility] [address] [place], [place] [address] Phone: [phone]  Sample Ozempic  0.5mg  weekly x 1 month  Recent Labs    09/08/23 0809 03/11/24 0847  HGBA1C 8.3* 9.0*    Please schedule a Follow-up Appointment to: Return in about 3 months (around 06/17/2024) for 3 month DM A1c, med updates.  If you have any other questions or concerns, please feel free to call the office or send a message through MyChart. You may also schedule an earlier appointment if necessary.  Additionally, you may be receiving a survey about your experience at our office within a few days to 1 week by e-mail or mail. We value your feedback.  Marsa Officer, DO [facility], [place]

## 2024-04-10 ENCOUNTER — Other Ambulatory Visit: Payer: Self-pay | Admitting: Family Medicine

## 2024-04-10 ENCOUNTER — Other Ambulatory Visit: Payer: Self-pay | Admitting: *Deleted

## 2024-04-10 ENCOUNTER — Ambulatory Visit
Admission: RE | Admit: 2024-04-10 | Discharge: 2024-04-10 | Disposition: A | Discharge status: Home / Self Care | Source: Ambulatory Visit | Attending: Radiation Oncology | Admitting: Radiation Oncology

## 2024-04-10 ENCOUNTER — Encounter: Payer: Self-pay | Admitting: Radiation Oncology

## 2024-04-10 VITALS — BP 138/77 | HR 81 | Temp 98.3°F | Resp 16 | Wt 233.0 lb

## 2024-04-10 DIAGNOSIS — C61 Malignant neoplasm of prostate: Secondary | ICD-10-CM | POA: Insufficient documentation

## 2024-04-10 DIAGNOSIS — R351 Nocturia: Secondary | ICD-10-CM | POA: Diagnosis not present

## 2024-04-10 DIAGNOSIS — Z923 Personal history of irradiation: Secondary | ICD-10-CM | POA: Diagnosis not present

## 2024-04-10 DIAGNOSIS — K219 Gastro-esophageal reflux disease without esophagitis: Secondary | ICD-10-CM

## 2024-04-10 DIAGNOSIS — R350 Frequency of micturition: Secondary | ICD-10-CM | POA: Diagnosis not present

## 2024-04-10 NOTE — Progress Notes (Signed)
 Radiation Oncology Follow up Note  Name: [name]   Date:   04/10/2024 MRN:  [id number] DOB: April 04, 1950    This 74 y.o. male presents to the clinic today for 1 month follow-up status post image guided IMRT radiation therapy for a Gleason 4 adenocarcinoma of the prostate presenting with a PSA of 10.4.  REFERRING PROVIDER: Edman Marsa PARAS, DO  HPI: Patient is a 74 year old male now out 1 month having completed external beam IMRT radiation therapy for Gleason 4 adenocarcinoma the prostate presented with a PSA of 10.4..  He is seen today in routine follow-up 1 month out is doing well.  Very low side effect profile states his urinary frequency has improved does have nocturia x 1-2.  No GI complaints.  Is having some problems with his back although he is does have instrumentation of his back and I have recommended seeing his orthopedic surgeon for that.  COMPLICATIONS OF TREATMENT: none  FOLLOW UP COMPLIANCE: keeps appointments   PHYSICAL EXAM:  BP 138/77   Pulse 81   Temp 98.3 F (36.8 C) (Tympanic)   Resp 16   Wt 233 lb (105.7 kg)   BMI 32.50 kg/m  Well-developed well-nourished patient in NAD. HEENT reveals PERLA, EOMI, discs not visualized.  Oral cavity is clear. No oral mucosal lesions are identified. Neck is clear without evidence of cervical or supraclavicular adenopathy. Lungs are clear to A&P. Cardiac examination is essentially unremarkable with regular rate and rhythm without murmur rub or thrill. Abdomen is benign with no organomegaly or masses noted. Motor sensory and DTR levels are equal and symmetric in the upper and lower extremities. Cranial nerves II through XII are grossly intact. Proprioception is intact. No peripheral adenopathy or edema is identified. No motor or sensory levels are noted. Crude visual fields are within normal range.  RADIOLOGY RESULTS: No current films for review  PLAN: Present time patient is doing well low side effect profile from image  guided IMRT radiation therapy.  I have asked to see him back in 3 months and repeat PSA prior to that visit.  Patient comprehends my recommendations well.  He will also be seeing urology as well as his orthopedic surgeon in the future.  I would like to take this opportunity to thank you for allowing me to participate in the care of your patient.SABRA Marcey Penton, MD

## 2024-04-11 NOTE — Telephone Encounter (Signed)
 Requested Prescriptions  Pending Prescriptions Disp Refills   pantoprazole  (PROTONIX ) 20 MG tablet [Pharmacy Med Name: PANTOPRAZOLE  SODIUM 20 MG Oral Tablet Delayed Release] 180 tablet 0    Sig: TAKE 1 TABLET TWICE DAILY BEFORE MEALS     Gastroenterology: Proton Pump Inhibitors Passed - 04/11/2024  1:54 PM      Passed - Valid encounter within last 12 months    Recent Outpatient Visits           3 weeks ago Annual physical exam   [facility] [place], Marsa PARAS, DO   7 months ago Type 2 diabetes mellitus with other specified complication, with long-term current use of insulin  [facility])   [place] [facility] [place], Marsa PARAS, [place]

## 2024-04-17 ENCOUNTER — Other Ambulatory Visit: Admitting: Pharmacist

## 2024-04-17 DIAGNOSIS — E1169 Type 2 diabetes mellitus with other specified complication: Secondary | ICD-10-CM

## 2024-04-17 DIAGNOSIS — Z794 Long term (current) use of insulin: Secondary | ICD-10-CM

## 2024-04-17 DIAGNOSIS — I2510 Atherosclerotic heart disease of native coronary artery without angina pectoris: Secondary | ICD-10-CM

## 2024-04-17 DIAGNOSIS — I10 Essential (primary) hypertension: Secondary | ICD-10-CM

## 2024-04-17 NOTE — Progress Notes (Unsigned)
 "  04/17/2024 Name: [name] MRN: [id number] DOB: June 25, 1950  Chief Complaint  Patient presents with   Medication Management   Medication Assistance    [name] is a 74 y.o. year old male who presented for a telephone visit.   They were referred to the pharmacist by their PCP for assistance in managing diabetes, hypertension, hyperlipidemia, and medication access.    Follow up today as planned to complete [employer] grant re-enrollment   Subjective:   Care Team: Primary Care Provider: Edman Marsa PARAS, DO ; Next Scheduled Visit: 06/12/2024 Cardiologist: Daniel Rogue, MD Nephrologist: Daniel Capri, MD  ; Next Scheduled Visit: 10/09/2024 Urology: [facility] Radiation Oncology: Daniel Aran, MD; Next Scheduled Visit: 07/10/2024  Medication Access/Adherence  Current Pharmacy:  [facility] [address], [place] - [address] [place] [address] Phone: [phone] Fax: [phone]  [facility] - [place], [place] - [address] [place] [address] Phone: [phone] Fax: [phone]  [facility] - [place], [place] - [address] [place] [address] Phone: [phone] Fax: [phone]   Patient reports affordability concerns with their medications: No  Patient reports access/transportation concerns to their pharmacy: No  Patient reports adherence concerns with their medications:  No     Patient using weekly pillbox   Diabetes:   Current medications:  Insulin  glargine ([name] /Lantus ) 37 units at bedtime Ozempic  0.5 mg weekly on Tuesday Reports tolerating well   Medications tried in the past:  Trulicity  (GI intolerance) Metformin  (unable to tolerate - GI side effects) Bydureon  (difficulty with device/administration) Invokana  (felt dehydrated) Rybelsus  (stopped with start of  Ozempic )   Current glucose readings: morning fasting ranging 136-204; this morning 195 *Attributes higher readings to eating more snacks (cookies) since radiation  Patient denies hypoglycemic s/sx including dizziness, shakiness, sweating - Carries glucose tablets to use if needed for low blood sugar   Current physical activity: reports typically stays active throughout the day working and walking some at home     Current medication access support: Enrolled in patient assistance for [name]  from [name] patient assistance program through 03/27/2025  Hypertension:   Current medications:  Carvedilol  12.5 mg twice daily Lisinopril  40 mg daily  Imdur  30 mg -1/2 tablet (15 mg) daily   Patient has an automated, upper arm home BP cuff; denies checking home BP recently   Denies symptoms of hypotension   Current physical activity: reports typically stays active throughout the day working and walking some at home     Hyperlipidemia/hx of CAD/NSTEMI:   Current lipid lowering medications: Repatha  140 mg every 14 days             Reports tolerating well   Previous therapies tried: atorvastatin  40 mg daily & 10 mg daily (unable to tolerate due to myalgia), ezetimibe     Have discussed importance of limiting saturated fats and trans fats in diet   Antiplatelet regimen: Clopidogrel  75 mg daily   Current physical activity: reports typically stays active throughout the day working and walking some at home   Current medication access support: Enrolled in [employer] Hypercholesterolemia - Medicare Access grant. Spouse completed re-enrollment online this morning. Now enrolled through 03/07/2025  Denies checking at home recently   Objective:  Lab Results  Component Value Date   HGBA1C 9.0 (H) 03/11/2024    Lab Results  Component Value Date   CREATININE 1.24 03/11/2024  BUN 22 03/11/2024   NA 139 03/11/2024   K 4.6 03/11/2024   CL 101 03/11/2024   CO2 31 03/11/2024    Lab Results   Component Value Date   CHOL 155 03/11/2024   HDL 44 03/11/2024   LDLCALC 73 03/11/2024   TRIG 312 (H) 03/11/2024   CHOLHDL 3.5 03/11/2024   BP Readings from Last 3 Encounters:  04/10/24 138/77  03/19/24 132/80  03/15/24 138/80   Pulse Readings from Last 3 Encounters:  04/10/24 81  03/19/24 76  01/10/24 67     Medications Reviewed Today   Medications were not reviewed in this encounter       Assessment/Plan:   Diabetes: - Reviewed goal A1c, goal fasting, and goal 2 hour post prandial glucose - Have reviewed dietary modifications including having well-balanced meals throughout the day while controlling carbohydrate portion sizes and limiting nighttime snacking - Advise patient that he may increase Ozempic  to 1 mg weekly Patient prefers to first use up current supply by injection Ozempic  0.5 mg x 2 (injecting each dose to either side of the abdomen) Send prescription for Ozempic  1 mg weekly to [facility] as requested - Recommend to monitor home blood sugar, keep log of results and have this record to review during appointments, but to contact office or CM Pharmacist sooner for readings outside of established parameters or for symptoms - Patient to contact [name] patient assistance as needed for refills of [name]     Hypertension: - Reviewed long term cardiovascular and renal outcomes of uncontrolled blood pressure - Reviewed appropriate blood pressure monitoring technique and reviewed goal blood pressure.  - Encourage dietary changes, including limiting salt/sodium intake and increasing intake of fruit and vegetables - Recommended to check home blood pressure and heart rate and keep a log of the results and to contact office for readings outside of established parameters   Hyperlipidemia: - Encourage patient to continue taking Repatha  as directed     Follow Up Plan: Clinical Pharmacist will follow up with patient by telephone on 05/17/2024 at 8:30 AM       [name], PharmD, [name], [name] Clinical Pharmacist [facility] [phone]   "

## 2024-04-19 MED ORDER — OZEMPIC (1 MG/DOSE) 4 MG/3ML ~~LOC~~ SOPN: 1.0000 mg | PEN_INJECTOR | SUBCUTANEOUS | 2 refills | Status: AC

## 2024-04-19 NOTE — Patient Instructions (Signed)
 Goals Addressed             This Visit's Progress    Pharmacy - Patient Goals       Our goal A1c is less than 7%. This corresponds with fasting sugars less than 130 and 2 hour after meal sugars less than 180. Please check your fasting blood sugar each morning and keep a record of the results  Check your blood pressure once daily, and any time you have concerning symptoms like headache, chest pain, dizziness, shortness of breath, or vision changes.   Our goal is less than 130/80.  To appropriately check your blood pressure, make sure you do the following:  1) Avoid caffeine, exercise, or tobacco products for 30 minutes before checking. Empty your bladder. 2) Sit with your back supported in a flat-backed chair. Rest your arm on something flat (arm of the chair, table, etc). 3) Sit still with your feet flat on the floor, resting, for at least 5 minutes.  4) Check your blood pressure. Take 1-2 readings.  5) Write down these readings and bring with you to any provider appointments.  Bring your home blood pressure machine with you to a provider's office for accuracy comparison at least once a year.   Make sure you take your blood pressure medications before you come to any office visit, even if you were asked to fast for labs.   Feel free to call me with any questions or concerns. I look forward to our next call  Sharyle Sia, PharmD, [facility] Clinical Pharmacist [facility] [phone]

## 2024-05-17 ENCOUNTER — Other Ambulatory Visit

## 2024-06-12 ENCOUNTER — Ambulatory Visit: Admitting: Family Medicine

## 2024-07-03 ENCOUNTER — Inpatient Hospital Stay

## 2024-07-10 ENCOUNTER — Ambulatory Visit: Admitting: Radiation Oncology

## 2025-03-19 ENCOUNTER — Ambulatory Visit
